# Patient Record
Sex: Female | Born: 1970 | Race: White | Hispanic: No | Marital: Married | Smoking: Never smoker
Health system: Southern US, Community
[De-identification: ages and names within clinical notes are randomized; demographics above are authoritative.]

## PROBLEM LIST (undated history)

## (undated) DIAGNOSIS — K829 Disease of gallbladder, unspecified: Secondary | ICD-10-CM

## (undated) DIAGNOSIS — K219 Gastro-esophageal reflux disease without esophagitis: Secondary | ICD-10-CM

## (undated) DIAGNOSIS — M791 Myalgia, unspecified site: Secondary | ICD-10-CM

## (undated) DIAGNOSIS — R7303 Prediabetes: Secondary | ICD-10-CM

## (undated) DIAGNOSIS — K760 Fatty (change of) liver, not elsewhere classified: Secondary | ICD-10-CM

## (undated) DIAGNOSIS — M064 Inflammatory polyarthropathy: Secondary | ICD-10-CM

## (undated) DIAGNOSIS — M797 Fibromyalgia: Secondary | ICD-10-CM

## (undated) DIAGNOSIS — R079 Chest pain, unspecified: Secondary | ICD-10-CM

## (undated) DIAGNOSIS — J45909 Unspecified asthma, uncomplicated: Secondary | ICD-10-CM

## (undated) DIAGNOSIS — I5189 Other ill-defined heart diseases: Secondary | ICD-10-CM

## (undated) DIAGNOSIS — N979 Female infertility, unspecified: Secondary | ICD-10-CM

## (undated) DIAGNOSIS — N62 Hypertrophy of breast: Secondary | ICD-10-CM

## (undated) DIAGNOSIS — Z8742 Personal history of other diseases of the female genital tract: Secondary | ICD-10-CM

## (undated) DIAGNOSIS — G8929 Other chronic pain: Secondary | ICD-10-CM

## (undated) DIAGNOSIS — Z8659 Personal history of other mental and behavioral disorders: Secondary | ICD-10-CM

## (undated) DIAGNOSIS — N289 Disorder of kidney and ureter, unspecified: Secondary | ICD-10-CM

## (undated) DIAGNOSIS — J069 Acute upper respiratory infection, unspecified: Secondary | ICD-10-CM

## (undated) DIAGNOSIS — F419 Anxiety disorder, unspecified: Secondary | ICD-10-CM

## (undated) DIAGNOSIS — K589 Irritable bowel syndrome without diarrhea: Secondary | ICD-10-CM

## (undated) DIAGNOSIS — IMO0002 Reserved for concepts with insufficient information to code with codable children: Secondary | ICD-10-CM

## (undated) DIAGNOSIS — I10 Essential (primary) hypertension: Secondary | ICD-10-CM

## (undated) DIAGNOSIS — M549 Dorsalgia, unspecified: Secondary | ICD-10-CM

## (undated) DIAGNOSIS — M1990 Unspecified osteoarthritis, unspecified site: Secondary | ICD-10-CM

## (undated) DIAGNOSIS — E739 Lactose intolerance, unspecified: Secondary | ICD-10-CM

## (undated) DIAGNOSIS — K5900 Constipation, unspecified: Secondary | ICD-10-CM

## (undated) DIAGNOSIS — M2550 Pain in unspecified joint: Secondary | ICD-10-CM

## (undated) DIAGNOSIS — Z91018 Allergy to other foods: Secondary | ICD-10-CM

## (undated) DIAGNOSIS — E282 Polycystic ovarian syndrome: Secondary | ICD-10-CM

## (undated) DIAGNOSIS — M51369 Other intervertebral disc degeneration, lumbar region without mention of lumbar back pain or lower extremity pain: Secondary | ICD-10-CM

## (undated) DIAGNOSIS — R61 Generalized hyperhidrosis: Secondary | ICD-10-CM

## (undated) DIAGNOSIS — E538 Deficiency of other specified B group vitamins: Secondary | ICD-10-CM

## (undated) DIAGNOSIS — F32A Depression, unspecified: Secondary | ICD-10-CM

## (undated) DIAGNOSIS — Z9889 Other specified postprocedural states: Secondary | ICD-10-CM

## (undated) DIAGNOSIS — D649 Anemia, unspecified: Secondary | ICD-10-CM

## (undated) DIAGNOSIS — E039 Hypothyroidism, unspecified: Secondary | ICD-10-CM

## (undated) DIAGNOSIS — F329 Major depressive disorder, single episode, unspecified: Secondary | ICD-10-CM

## (undated) DIAGNOSIS — R112 Nausea with vomiting, unspecified: Secondary | ICD-10-CM

## (undated) DIAGNOSIS — M5136 Other intervertebral disc degeneration, lumbar region: Secondary | ICD-10-CM

## (undated) DIAGNOSIS — Z87442 Personal history of urinary calculi: Secondary | ICD-10-CM

## (undated) DIAGNOSIS — M329 Systemic lupus erythematosus, unspecified: Secondary | ICD-10-CM

## (undated) HISTORY — DX: Anemia, unspecified: D64.9

## (undated) HISTORY — DX: Unspecified osteoarthritis, unspecified site: M19.90

## (undated) HISTORY — DX: Constipation, unspecified: K59.00

## (undated) HISTORY — PX: PLANTAR FASCIA SURGERY: SHX746

## (undated) HISTORY — DX: Generalized hyperhidrosis: R61

## (undated) HISTORY — PX: CARPAL TUNNEL RELEASE: SHX101

## (undated) HISTORY — DX: Gastro-esophageal reflux disease without esophagitis: K21.9

## (undated) HISTORY — DX: Disease of gallbladder, unspecified: K82.9

## (undated) HISTORY — DX: Other intervertebral disc degeneration, lumbar region: M51.36

## (undated) HISTORY — DX: Systemic lupus erythematosus, unspecified: M32.9

## (undated) HISTORY — DX: Fatty (change of) liver, not elsewhere classified: K76.0

## (undated) HISTORY — DX: Anxiety disorder, unspecified: F41.9

## (undated) HISTORY — PX: BACK SURGERY: SHX140

## (undated) HISTORY — DX: Polycystic ovarian syndrome: E28.2

## (undated) HISTORY — DX: Irritable bowel syndrome, unspecified: K58.9

## (undated) HISTORY — DX: Fibromyalgia: M79.7

## (undated) HISTORY — DX: Lactose intolerance, unspecified: E73.9

## (undated) HISTORY — DX: Major depressive disorder, single episode, unspecified: F32.9

## (undated) HISTORY — PX: ABDOMINAL HYSTERECTOMY: SHX81

## (undated) HISTORY — DX: Allergy to other foods: Z91.018

## (undated) HISTORY — DX: Unspecified asthma, uncomplicated: J45.909

## (undated) HISTORY — DX: Essential (primary) hypertension: I10

## (undated) HISTORY — DX: Other intervertebral disc degeneration, lumbar region without mention of lumbar back pain or lower extremity pain: M51.369

## (undated) HISTORY — DX: Reserved for concepts with insufficient information to code with codable children: IMO0002

## (undated) HISTORY — PX: TONSILLECTOMY: SUR1361

## (undated) HISTORY — DX: Female infertility, unspecified: N97.9

## (undated) HISTORY — PX: EYE SURGERY: SHX253

## (undated) HISTORY — DX: Depression, unspecified: F32.A

## (undated) HISTORY — DX: Prediabetes: R73.03

## (undated) HISTORY — DX: Deficiency of other specified B group vitamins: E53.8

## (undated) HISTORY — DX: Personal history of other mental and behavioral disorders: Z86.59

## (undated) HISTORY — PX: COLONOSCOPY: SHX174

## (undated) HISTORY — PX: DILATION AND CURETTAGE OF UTERUS: SHX78

## (undated) HISTORY — DX: Hypothyroidism, unspecified: E03.9

## (undated) HISTORY — DX: Pain in unspecified joint: M25.50

## (undated) HISTORY — DX: Dorsalgia, unspecified: M54.9

## (undated) HISTORY — DX: Disorder of kidney and ureter, unspecified: N28.9

## (undated) HISTORY — PX: REDUCTION MAMMAPLASTY: SUR839

## (undated) HISTORY — PX: ANKLE SURGERY: SHX546

---

## 1989-04-04 HISTORY — PX: APPENDECTOMY: SHX54

## 1997-08-21 ENCOUNTER — Inpatient Hospital Stay (HOSPITAL_COMMUNITY): Admission: AD | Admit: 1997-08-21 | Discharge: 1997-08-21 | Payer: Self-pay | Admitting: Obstetrics and Gynecology

## 1997-10-10 ENCOUNTER — Ambulatory Visit (HOSPITAL_COMMUNITY): Admission: RE | Admit: 1997-10-10 | Discharge: 1997-10-10 | Payer: Self-pay | Admitting: *Deleted

## 1998-04-04 HISTORY — PX: LAPAROSCOPIC ASSISTED VAGINAL HYSTERECTOMY: SHX5398

## 1998-06-19 ENCOUNTER — Ambulatory Visit (HOSPITAL_COMMUNITY): Admission: RE | Admit: 1998-06-19 | Discharge: 1998-06-19 | Payer: Self-pay | Admitting: Gastroenterology

## 1998-07-20 ENCOUNTER — Inpatient Hospital Stay (HOSPITAL_COMMUNITY): Admission: AD | Admit: 1998-07-20 | Discharge: 1998-07-20 | Payer: Self-pay | Admitting: Obstetrics and Gynecology

## 1998-09-21 ENCOUNTER — Inpatient Hospital Stay (HOSPITAL_COMMUNITY): Admission: RE | Admit: 1998-09-21 | Discharge: 1998-09-23 | Payer: Self-pay | Admitting: *Deleted

## 1998-09-21 ENCOUNTER — Encounter (INDEPENDENT_AMBULATORY_CARE_PROVIDER_SITE_OTHER): Payer: Self-pay | Admitting: Specialist

## 1999-04-02 ENCOUNTER — Ambulatory Visit (HOSPITAL_COMMUNITY): Admission: RE | Admit: 1999-04-02 | Discharge: 1999-04-02 | Payer: Self-pay | Admitting: *Deleted

## 1999-04-04 ENCOUNTER — Emergency Department (HOSPITAL_COMMUNITY): Admission: EM | Admit: 1999-04-04 | Discharge: 1999-04-04 | Payer: Self-pay | Admitting: Emergency Medicine

## 1999-06-25 ENCOUNTER — Ambulatory Visit (HOSPITAL_COMMUNITY): Admission: RE | Admit: 1999-06-25 | Discharge: 1999-06-25 | Payer: Self-pay | Admitting: Urology

## 1999-11-03 ENCOUNTER — Encounter (INDEPENDENT_AMBULATORY_CARE_PROVIDER_SITE_OTHER): Payer: Self-pay

## 1999-11-03 ENCOUNTER — Ambulatory Visit (HOSPITAL_COMMUNITY): Admission: RE | Admit: 1999-11-03 | Discharge: 1999-11-03 | Payer: Self-pay | Admitting: *Deleted

## 1999-11-03 HISTORY — PX: OTHER SURGICAL HISTORY: SHX169

## 1999-11-06 ENCOUNTER — Inpatient Hospital Stay (HOSPITAL_COMMUNITY): Admission: AD | Admit: 1999-11-06 | Discharge: 1999-11-06 | Payer: Self-pay | Admitting: *Deleted

## 2000-01-03 ENCOUNTER — Encounter: Payer: Self-pay | Admitting: Emergency Medicine

## 2000-01-03 ENCOUNTER — Emergency Department (HOSPITAL_COMMUNITY): Admission: EM | Admit: 2000-01-03 | Discharge: 2000-01-03 | Payer: Self-pay | Admitting: Emergency Medicine

## 2000-03-14 ENCOUNTER — Ambulatory Visit (HOSPITAL_COMMUNITY): Admission: RE | Admit: 2000-03-14 | Discharge: 2000-03-14 | Payer: Self-pay | Admitting: Gastroenterology

## 2000-03-14 ENCOUNTER — Encounter: Payer: Self-pay | Admitting: Gastroenterology

## 2000-07-17 ENCOUNTER — Ambulatory Visit (HOSPITAL_COMMUNITY): Admission: RE | Admit: 2000-07-17 | Discharge: 2000-07-17 | Payer: Self-pay | Admitting: Gastroenterology

## 2000-07-17 ENCOUNTER — Encounter: Payer: Self-pay | Admitting: Gastroenterology

## 2000-08-03 ENCOUNTER — Encounter: Payer: Self-pay | Admitting: Urology

## 2000-08-04 ENCOUNTER — Ambulatory Visit (HOSPITAL_COMMUNITY): Admission: RE | Admit: 2000-08-04 | Discharge: 2000-08-04 | Payer: Self-pay | Admitting: Urology

## 2000-08-04 HISTORY — PX: OTHER SURGICAL HISTORY: SHX169

## 2000-09-27 ENCOUNTER — Other Ambulatory Visit: Admission: RE | Admit: 2000-09-27 | Discharge: 2000-09-27 | Payer: Self-pay | Admitting: *Deleted

## 2000-11-30 ENCOUNTER — Ambulatory Visit (HOSPITAL_COMMUNITY): Admission: RE | Admit: 2000-11-30 | Discharge: 2000-11-30 | Payer: Self-pay | Admitting: Urology

## 2000-11-30 ENCOUNTER — Encounter: Payer: Self-pay | Admitting: Urology

## 2001-04-04 HISTORY — PX: LUMBAR LAMINECTOMY: SHX95

## 2001-05-17 ENCOUNTER — Encounter (INDEPENDENT_AMBULATORY_CARE_PROVIDER_SITE_OTHER): Payer: Self-pay | Admitting: Specialist

## 2001-05-17 ENCOUNTER — Ambulatory Visit (HOSPITAL_COMMUNITY): Admission: RE | Admit: 2001-05-17 | Discharge: 2001-05-17 | Payer: Self-pay | Admitting: Obstetrics and Gynecology

## 2001-05-17 HISTORY — PX: OTHER SURGICAL HISTORY: SHX169

## 2001-08-08 ENCOUNTER — Inpatient Hospital Stay (HOSPITAL_COMMUNITY): Admission: AD | Admit: 2001-08-08 | Discharge: 2001-08-08 | Payer: Self-pay | Admitting: Obstetrics and Gynecology

## 2001-11-08 ENCOUNTER — Encounter: Payer: Self-pay | Admitting: Neurosurgery

## 2001-11-12 ENCOUNTER — Encounter: Payer: Self-pay | Admitting: Neurosurgery

## 2001-11-12 ENCOUNTER — Inpatient Hospital Stay (HOSPITAL_COMMUNITY): Admission: RE | Admit: 2001-11-12 | Discharge: 2001-11-14 | Payer: Self-pay | Admitting: Neurosurgery

## 2002-02-05 ENCOUNTER — Encounter (INDEPENDENT_AMBULATORY_CARE_PROVIDER_SITE_OTHER): Payer: Self-pay

## 2002-02-05 ENCOUNTER — Ambulatory Visit (HOSPITAL_COMMUNITY): Admission: RE | Admit: 2002-02-05 | Discharge: 2002-02-05 | Payer: Self-pay | Admitting: *Deleted

## 2002-02-05 HISTORY — PX: OTHER SURGICAL HISTORY: SHX169

## 2002-02-08 ENCOUNTER — Inpatient Hospital Stay (HOSPITAL_COMMUNITY): Admission: AD | Admit: 2002-02-08 | Discharge: 2002-02-08 | Payer: Self-pay | Admitting: Obstetrics and Gynecology

## 2002-04-03 ENCOUNTER — Encounter: Payer: Self-pay | Admitting: Urology

## 2002-04-03 ENCOUNTER — Encounter: Admission: RE | Admit: 2002-04-03 | Discharge: 2002-04-03 | Payer: Self-pay | Admitting: Urology

## 2002-09-25 ENCOUNTER — Other Ambulatory Visit: Admission: RE | Admit: 2002-09-25 | Discharge: 2002-09-25 | Payer: Self-pay | Admitting: *Deleted

## 2002-12-02 ENCOUNTER — Encounter: Admission: RE | Admit: 2002-12-02 | Discharge: 2003-03-02 | Payer: Self-pay | Admitting: Family Medicine

## 2002-12-12 ENCOUNTER — Encounter: Payer: Self-pay | Admitting: Cardiology

## 2003-04-18 ENCOUNTER — Ambulatory Visit (HOSPITAL_COMMUNITY): Admission: RE | Admit: 2003-04-18 | Discharge: 2003-04-18 | Payer: Self-pay | Admitting: Neurosurgery

## 2003-09-30 ENCOUNTER — Other Ambulatory Visit: Admission: RE | Admit: 2003-09-30 | Discharge: 2003-09-30 | Payer: Self-pay | Admitting: Obstetrics and Gynecology

## 2003-11-13 ENCOUNTER — Encounter: Admission: RE | Admit: 2003-11-13 | Discharge: 2003-11-13 | Payer: Self-pay | Admitting: Internal Medicine

## 2003-12-30 ENCOUNTER — Emergency Department (HOSPITAL_COMMUNITY): Admission: EM | Admit: 2003-12-30 | Discharge: 2003-12-30 | Payer: Self-pay | Admitting: Emergency Medicine

## 2004-04-27 ENCOUNTER — Ambulatory Visit: Payer: Self-pay | Admitting: Internal Medicine

## 2004-06-09 ENCOUNTER — Encounter: Admission: RE | Admit: 2004-06-09 | Discharge: 2004-06-09 | Payer: Self-pay | Admitting: Gastroenterology

## 2004-06-11 ENCOUNTER — Encounter: Admission: RE | Admit: 2004-06-11 | Discharge: 2004-06-11 | Payer: Self-pay | Admitting: Gastroenterology

## 2004-06-15 ENCOUNTER — Ambulatory Visit: Payer: Self-pay | Admitting: Internal Medicine

## 2004-06-18 ENCOUNTER — Encounter: Admission: RE | Admit: 2004-06-18 | Discharge: 2004-06-18 | Payer: Self-pay | Admitting: Surgery

## 2004-06-20 ENCOUNTER — Inpatient Hospital Stay (HOSPITAL_COMMUNITY): Admission: EM | Admit: 2004-06-20 | Discharge: 2004-06-22 | Payer: Self-pay | Admitting: Emergency Medicine

## 2004-06-21 ENCOUNTER — Encounter (INDEPENDENT_AMBULATORY_CARE_PROVIDER_SITE_OTHER): Payer: Self-pay | Admitting: *Deleted

## 2004-06-22 ENCOUNTER — Encounter (INDEPENDENT_AMBULATORY_CARE_PROVIDER_SITE_OTHER): Payer: Self-pay | Admitting: *Deleted

## 2004-07-09 ENCOUNTER — Ambulatory Visit (HOSPITAL_BASED_OUTPATIENT_CLINIC_OR_DEPARTMENT_OTHER): Admission: RE | Admit: 2004-07-09 | Discharge: 2004-07-09 | Payer: Self-pay | Admitting: Urology

## 2004-07-09 ENCOUNTER — Ambulatory Visit (HOSPITAL_COMMUNITY): Admission: RE | Admit: 2004-07-09 | Discharge: 2004-07-09 | Payer: Self-pay | Admitting: Urology

## 2004-07-09 HISTORY — PX: OTHER SURGICAL HISTORY: SHX169

## 2004-09-01 ENCOUNTER — Ambulatory Visit: Payer: Self-pay | Admitting: Internal Medicine

## 2004-11-17 ENCOUNTER — Ambulatory Visit: Payer: Self-pay | Admitting: Internal Medicine

## 2004-12-16 ENCOUNTER — Other Ambulatory Visit: Admission: RE | Admit: 2004-12-16 | Discharge: 2004-12-16 | Payer: Self-pay | Admitting: Obstetrics and Gynecology

## 2004-12-27 ENCOUNTER — Ambulatory Visit (HOSPITAL_COMMUNITY): Admission: RE | Admit: 2004-12-27 | Discharge: 2004-12-27 | Payer: Self-pay | Admitting: Obstetrics and Gynecology

## 2005-01-07 ENCOUNTER — Ambulatory Visit: Payer: Self-pay | Admitting: Internal Medicine

## 2005-10-03 ENCOUNTER — Ambulatory Visit: Payer: Self-pay | Admitting: Internal Medicine

## 2005-12-28 ENCOUNTER — Ambulatory Visit: Payer: Self-pay | Admitting: Internal Medicine

## 2006-01-31 ENCOUNTER — Emergency Department (HOSPITAL_COMMUNITY): Admission: EM | Admit: 2006-01-31 | Discharge: 2006-02-01 | Payer: Self-pay | Admitting: Emergency Medicine

## 2006-02-01 ENCOUNTER — Ambulatory Visit: Payer: Self-pay | Admitting: Internal Medicine

## 2006-07-11 ENCOUNTER — Encounter: Payer: Self-pay | Admitting: Cardiovascular Disease

## 2006-10-02 ENCOUNTER — Ambulatory Visit (HOSPITAL_COMMUNITY): Admission: RE | Admit: 2006-10-02 | Discharge: 2006-10-02 | Payer: Self-pay | Admitting: Family Medicine

## 2006-10-11 ENCOUNTER — Encounter: Payer: Self-pay | Admitting: Cardiovascular Disease

## 2006-10-13 ENCOUNTER — Encounter: Payer: Self-pay | Admitting: Cardiovascular Disease

## 2006-10-20 ENCOUNTER — Encounter: Payer: Self-pay | Admitting: Cardiovascular Disease

## 2006-11-21 ENCOUNTER — Encounter: Payer: Self-pay | Admitting: Cardiovascular Disease

## 2006-11-23 ENCOUNTER — Ambulatory Visit (HOSPITAL_COMMUNITY): Admission: RE | Admit: 2006-11-23 | Discharge: 2006-11-23 | Payer: Self-pay | Admitting: Allergy

## 2006-11-28 ENCOUNTER — Encounter: Payer: Self-pay | Admitting: Cardiovascular Disease

## 2007-03-02 ENCOUNTER — Ambulatory Visit: Payer: Self-pay | Admitting: Internal Medicine

## 2007-03-02 DIAGNOSIS — I10 Essential (primary) hypertension: Secondary | ICD-10-CM | POA: Insufficient documentation

## 2007-03-02 DIAGNOSIS — E039 Hypothyroidism, unspecified: Secondary | ICD-10-CM | POA: Insufficient documentation

## 2007-03-02 DIAGNOSIS — E282 Polycystic ovarian syndrome: Secondary | ICD-10-CM

## 2007-03-02 HISTORY — DX: Polycystic ovarian syndrome: E28.2

## 2007-03-27 ENCOUNTER — Ambulatory Visit: Payer: Self-pay | Admitting: Internal Medicine

## 2007-03-27 ENCOUNTER — Telehealth: Payer: Self-pay | Admitting: Internal Medicine

## 2007-04-10 ENCOUNTER — Ambulatory Visit: Payer: Self-pay | Admitting: Internal Medicine

## 2007-04-10 ENCOUNTER — Telehealth (INDEPENDENT_AMBULATORY_CARE_PROVIDER_SITE_OTHER): Payer: Self-pay | Admitting: *Deleted

## 2007-04-10 DIAGNOSIS — F329 Major depressive disorder, single episode, unspecified: Secondary | ICD-10-CM | POA: Insufficient documentation

## 2007-04-10 DIAGNOSIS — F411 Generalized anxiety disorder: Secondary | ICD-10-CM | POA: Insufficient documentation

## 2007-04-10 DIAGNOSIS — R519 Headache, unspecified: Secondary | ICD-10-CM | POA: Insufficient documentation

## 2007-04-10 DIAGNOSIS — R079 Chest pain, unspecified: Secondary | ICD-10-CM | POA: Insufficient documentation

## 2007-04-10 DIAGNOSIS — R51 Headache: Secondary | ICD-10-CM | POA: Insufficient documentation

## 2007-04-10 LAB — CONVERTED CEMR LAB: Potassium: 4.2 meq/L (ref 3.5–5.1)

## 2007-04-11 ENCOUNTER — Telehealth: Payer: Self-pay | Admitting: Internal Medicine

## 2007-04-12 ENCOUNTER — Ambulatory Visit: Payer: Self-pay | Admitting: Internal Medicine

## 2007-04-12 DIAGNOSIS — K5289 Other specified noninfective gastroenteritis and colitis: Secondary | ICD-10-CM | POA: Insufficient documentation

## 2007-04-12 DIAGNOSIS — R82998 Other abnormal findings in urine: Secondary | ICD-10-CM | POA: Insufficient documentation

## 2007-04-13 ENCOUNTER — Encounter: Payer: Self-pay | Admitting: Internal Medicine

## 2007-04-17 ENCOUNTER — Encounter (INDEPENDENT_AMBULATORY_CARE_PROVIDER_SITE_OTHER): Payer: Self-pay | Admitting: *Deleted

## 2007-04-17 ENCOUNTER — Ambulatory Visit: Payer: Self-pay | Admitting: Internal Medicine

## 2007-04-17 DIAGNOSIS — R1011 Right upper quadrant pain: Secondary | ICD-10-CM | POA: Insufficient documentation

## 2007-04-17 DIAGNOSIS — R197 Diarrhea, unspecified: Secondary | ICD-10-CM | POA: Insufficient documentation

## 2007-04-19 LAB — CONVERTED CEMR LAB
ALT: 25 units/L (ref 0–35)
Amylase: 39 units/L (ref 27–131)
Eosinophils Absolute: 0.1 10*3/uL (ref 0.0–0.6)
Lymphocytes Relative: 35.5 % (ref 12.0–46.0)
MCV: 85.3 fL (ref 78.0–100.0)
Monocytes Relative: 9.6 % (ref 3.0–11.0)
Neutro Abs: 2.8 10*3/uL (ref 1.4–7.7)
Platelets: 247 10*3/uL (ref 150–400)

## 2007-04-20 ENCOUNTER — Encounter: Admission: RE | Admit: 2007-04-20 | Discharge: 2007-04-20 | Payer: Self-pay | Admitting: Internal Medicine

## 2007-04-23 ENCOUNTER — Encounter (INDEPENDENT_AMBULATORY_CARE_PROVIDER_SITE_OTHER): Payer: Self-pay | Admitting: *Deleted

## 2007-04-24 ENCOUNTER — Encounter (INDEPENDENT_AMBULATORY_CARE_PROVIDER_SITE_OTHER): Payer: Self-pay | Admitting: *Deleted

## 2007-04-24 ENCOUNTER — Telehealth (INDEPENDENT_AMBULATORY_CARE_PROVIDER_SITE_OTHER): Payer: Self-pay | Admitting: *Deleted

## 2007-04-24 LAB — CONVERTED CEMR LAB
Metanephrines, Ur: 53
Normetanephrine, 24H Ur: 70

## 2007-05-02 ENCOUNTER — Ambulatory Visit: Payer: Self-pay | Admitting: Gastroenterology

## 2007-05-04 ENCOUNTER — Ambulatory Visit (HOSPITAL_COMMUNITY): Admission: RE | Admit: 2007-05-04 | Discharge: 2007-05-04 | Payer: Self-pay | Admitting: Obstetrics & Gynecology

## 2007-05-04 ENCOUNTER — Ambulatory Visit: Payer: Self-pay | Admitting: Gastroenterology

## 2007-05-04 LAB — CONVERTED CEMR LAB
ALT: 23 units/L (ref 0–35)
AST: 19 units/L (ref 0–37)
Basophils Absolute: 0.1 10*3/uL (ref 0.0–0.1)
Bilirubin, Direct: 0.2 mg/dL (ref 0.0–0.3)
Eosinophils Relative: 1.2 % (ref 0.0–5.0)
HCT: 37 % (ref 36.0–46.0)
Neutrophils Relative %: 62.5 % (ref 43.0–77.0)
RBC: 4.4 M/uL (ref 3.87–5.11)
RDW: 12.8 % (ref 11.5–14.6)
Total Protein: 6.9 g/dL (ref 6.0–8.3)
WBC: 9.2 10*3/uL (ref 4.5–10.5)

## 2007-05-07 ENCOUNTER — Ambulatory Visit (HOSPITAL_COMMUNITY): Admission: RE | Admit: 2007-05-07 | Discharge: 2007-05-07 | Payer: Self-pay | Admitting: Gastroenterology

## 2007-05-07 ENCOUNTER — Encounter: Payer: Self-pay | Admitting: Internal Medicine

## 2007-05-07 ENCOUNTER — Ambulatory Visit: Payer: Self-pay | Admitting: Gastroenterology

## 2007-05-07 LAB — CONVERTED CEMR LAB
Crystals: NEGATIVE
Ketones, ur: NEGATIVE mg/dL
Leukocytes, UA: NEGATIVE
Mucus, UA: NEGATIVE
Total Protein, Urine: NEGATIVE mg/dL

## 2007-05-14 ENCOUNTER — Ambulatory Visit: Payer: Self-pay | Admitting: Gastroenterology

## 2007-05-25 ENCOUNTER — Ambulatory Visit: Payer: Self-pay | Admitting: Internal Medicine

## 2007-05-25 DIAGNOSIS — J019 Acute sinusitis, unspecified: Secondary | ICD-10-CM | POA: Insufficient documentation

## 2007-05-31 ENCOUNTER — Ambulatory Visit (HOSPITAL_COMMUNITY): Admission: RE | Admit: 2007-05-31 | Discharge: 2007-06-01 | Payer: Self-pay | Admitting: General Surgery

## 2007-05-31 ENCOUNTER — Encounter (INDEPENDENT_AMBULATORY_CARE_PROVIDER_SITE_OTHER): Payer: Self-pay | Admitting: General Surgery

## 2007-05-31 HISTORY — PX: LAPAROSCOPIC CHOLECYSTECTOMY: SUR755

## 2007-07-03 ENCOUNTER — Telehealth (INDEPENDENT_AMBULATORY_CARE_PROVIDER_SITE_OTHER): Payer: Self-pay | Admitting: *Deleted

## 2007-09-13 ENCOUNTER — Telehealth (INDEPENDENT_AMBULATORY_CARE_PROVIDER_SITE_OTHER): Payer: Self-pay | Admitting: *Deleted

## 2007-09-28 ENCOUNTER — Encounter: Payer: Self-pay | Admitting: Internal Medicine

## 2007-10-10 ENCOUNTER — Telehealth (INDEPENDENT_AMBULATORY_CARE_PROVIDER_SITE_OTHER): Payer: Self-pay | Admitting: *Deleted

## 2007-10-10 ENCOUNTER — Encounter: Payer: Self-pay | Admitting: Family Medicine

## 2008-01-02 ENCOUNTER — Encounter: Payer: Self-pay | Admitting: Cardiovascular Disease

## 2008-01-24 ENCOUNTER — Encounter: Payer: Self-pay | Admitting: Cardiovascular Disease

## 2008-03-11 ENCOUNTER — Encounter: Payer: Self-pay | Admitting: Internal Medicine

## 2008-05-12 ENCOUNTER — Encounter: Payer: Self-pay | Admitting: Internal Medicine

## 2008-06-04 ENCOUNTER — Ambulatory Visit: Payer: Self-pay | Admitting: Internal Medicine

## 2008-07-18 ENCOUNTER — Encounter: Payer: Self-pay | Admitting: Internal Medicine

## 2008-07-21 ENCOUNTER — Ambulatory Visit (HOSPITAL_COMMUNITY): Admission: RE | Admit: 2008-07-21 | Discharge: 2008-07-21 | Payer: Self-pay | Admitting: Rheumatology

## 2008-08-27 ENCOUNTER — Ambulatory Visit (HOSPITAL_COMMUNITY): Admission: RE | Admit: 2008-08-27 | Discharge: 2008-08-27 | Payer: Self-pay | Admitting: Orthopedic Surgery

## 2008-10-01 ENCOUNTER — Encounter: Payer: Self-pay | Admitting: Internal Medicine

## 2008-11-04 ENCOUNTER — Encounter: Payer: Self-pay | Admitting: Internal Medicine

## 2008-11-26 ENCOUNTER — Encounter: Payer: Self-pay | Admitting: Cardiovascular Disease

## 2008-12-12 ENCOUNTER — Encounter: Payer: Self-pay | Admitting: Cardiovascular Disease

## 2009-01-06 ENCOUNTER — Ambulatory Visit: Payer: Self-pay | Admitting: Cardiovascular Disease

## 2009-01-06 DIAGNOSIS — I10 Essential (primary) hypertension: Secondary | ICD-10-CM | POA: Insufficient documentation

## 2009-01-07 ENCOUNTER — Encounter: Payer: Self-pay | Admitting: Internal Medicine

## 2009-01-13 ENCOUNTER — Telehealth (INDEPENDENT_AMBULATORY_CARE_PROVIDER_SITE_OTHER): Payer: Self-pay | Admitting: *Deleted

## 2009-01-27 ENCOUNTER — Telehealth (INDEPENDENT_AMBULATORY_CARE_PROVIDER_SITE_OTHER): Payer: Self-pay | Admitting: *Deleted

## 2009-03-24 ENCOUNTER — Ambulatory Visit: Payer: Self-pay

## 2009-04-13 ENCOUNTER — Telehealth: Payer: Self-pay | Admitting: Cardiology

## 2009-05-07 ENCOUNTER — Ambulatory Visit: Payer: Self-pay | Admitting: Cardiovascular Disease

## 2009-05-07 ENCOUNTER — Telehealth: Payer: Self-pay | Admitting: Cardiovascular Disease

## 2009-05-19 ENCOUNTER — Encounter: Payer: Self-pay | Admitting: Internal Medicine

## 2009-06-11 ENCOUNTER — Encounter: Payer: Self-pay | Admitting: Internal Medicine

## 2009-06-19 LAB — CONVERTED CEMR LAB
ALT: 18 units/L (ref 0–35)
Albumin: 4 g/dL (ref 3.5–5.2)
HDL: 57 mg/dL (ref >39)
LDL Cholesterol: 67 mg/dL (ref 0–99)
Total CHOL/HDL Ratio: 2.5
Total Protein: 6.7 g/dL (ref 6.0–8.3)
Triglycerides: 87 mg/dL (ref <150)
VLDL: 17 mg/dL (ref 0–40)

## 2009-06-30 ENCOUNTER — Ambulatory Visit: Payer: Self-pay | Admitting: Endocrinology

## 2009-06-30 DIAGNOSIS — G5600 Carpal tunnel syndrome, unspecified upper limb: Secondary | ICD-10-CM | POA: Insufficient documentation

## 2009-06-30 DIAGNOSIS — E538 Deficiency of other specified B group vitamins: Secondary | ICD-10-CM | POA: Insufficient documentation

## 2009-06-30 LAB — CONVERTED CEMR LAB
Calcium: 9.5 mg/dL (ref 8.4–10.5)
GFR calc non Af Amer: 84.6 mL/min (ref >60)
Potassium: 4.2 meq/L (ref 3.5–5.1)
Sodium: 143 meq/L (ref 135–145)
TSH: 1.37 microintl units/mL (ref 0.35–5.50)
Vitamin B-12: 576 pg/mL (ref 211–911)

## 2009-07-22 ENCOUNTER — Encounter: Payer: Self-pay | Admitting: Endocrinology

## 2009-09-02 ENCOUNTER — Encounter: Payer: Self-pay | Admitting: Endocrinology

## 2009-09-10 ENCOUNTER — Encounter: Payer: Self-pay | Admitting: Endocrinology

## 2009-10-28 ENCOUNTER — Telehealth (INDEPENDENT_AMBULATORY_CARE_PROVIDER_SITE_OTHER): Payer: Self-pay | Admitting: *Deleted

## 2009-11-23 ENCOUNTER — Encounter: Payer: Self-pay | Admitting: Internal Medicine

## 2009-11-23 ENCOUNTER — Encounter: Admission: RE | Admit: 2009-11-23 | Discharge: 2009-11-23 | Payer: Self-pay | Admitting: Obstetrics and Gynecology

## 2009-12-25 ENCOUNTER — Encounter: Payer: Self-pay | Admitting: Internal Medicine

## 2009-12-31 ENCOUNTER — Encounter: Admission: RE | Admit: 2009-12-31 | Discharge: 2009-12-31 | Payer: Self-pay | Admitting: Rheumatology

## 2010-01-02 ENCOUNTER — Ambulatory Visit: Payer: Self-pay | Admitting: Family Medicine

## 2010-01-13 ENCOUNTER — Encounter: Payer: Self-pay | Admitting: Internal Medicine

## 2010-04-20 ENCOUNTER — Telehealth: Payer: Self-pay | Admitting: Endocrinology

## 2010-04-25 ENCOUNTER — Encounter: Payer: Self-pay | Admitting: Surgery

## 2010-04-25 ENCOUNTER — Encounter: Payer: Self-pay | Admitting: Gastroenterology

## 2010-04-26 ENCOUNTER — Encounter: Payer: Self-pay | Admitting: Orthopedic Surgery

## 2010-05-04 NOTE — Letter (Signed)
Summary: Verne Spurr MD  Verne Spurr MD   Imported By: Sherian Rein 07/31/2009 09:06:02  _____________________________________________________________________  External Attachment:    Type:   Image     Comment:   External Document

## 2010-05-04 NOTE — Assessment & Plan Note (Signed)
Summary: NEW ENDO HYPOTHY [facility] PT-PKG/OFF-DR [facility] PH[facility]-ST[facility]   Vital Signs:  Patient profile:   40 year old female Height:      67 inches (170.18 cm) Weight:      247 pounds (112.27 kg) BMI:     38.83 O2 Sat:      97 % on Room air Temp:     98.3 degrees F (36.83 degrees C) oral Pulse rate:   76 / minute BP sitting:   100 / 62  (left arm) Cuff size:   large  Vitals Entered By: Josph Macho RMA (June 30, 2009 8:23 AM) Debby Freiberg Christopher,SMA  O2 Flow:  Room air CC: New Endo: Hypothyroid/ PT no longer gets B 12 injections,prednisone, plaquenil, cipro, zinc hydroxyzine or mexatheraxatic//[place]   CC:  New Endo: Hypothyroid/ PT no longer gets B 12 injections, prednisone, plaquenil, cipro, and zinc hydroxyzine or mexatheraxatic//[place].  History of Present Illness: pt states she was dx'ed with hypothyroidism approx 2 years ago.  she has been on synthroid since then. symptomatically, pt states 3 years of moderate pain at the hands, and associated numbness.  she had incomplete relief with surgery.    Current Medications (verified): 1)  Lisinopril 20 Mg Tabs (Lisinopril) .... 2 Tablets By Mouth Daily 2)  Levothroid 112 Mcg Tabs (Levothyroxine Sodium) .[name].. 1 By Mouth Once Daily 3)  Aspirin 81 Mg Tbec (Aspirin) .... Take One Tablet By Mouth Daily 4)  Fish Oil 1000 Mg Caps (Omega-3 Fatty Acids) .... 2 By Mouth Once Daily 5)  B12 Injections .... Q Month 6)  Glucosamine-Msm Complex-Collgn  Caps (Glucos-Msm-C-Mn-Gngr-Will-Coll) .[name].. 1 By Mouth Once Daily 7)  One-A-Day Womens  Tabs (Multiple Vitamins-Calcium) .[name].. 1 By Mouth Once Daily 8)  Vitamin D3 1000 Unit Caps (Cholecalciferol) .... 2 By Mouth Once Daily 9)  Hydrochlorothiazide 25 Mg Tabs (Hydrochlorothiazide) .... Take One Tablet By Mouth Daily. 10)  Plaquenil 200 Mg Tabs (Hydroxychloroquine Sulfate) .[name].. 1 By Mouth Four Times A Day 11)  Prednisone 5 Mg Tabs (Prednisone) .[name].. 1 By Mouth Once Daily As Directed 12)  Bupropion Hcl 100  Mg Xr12h-Tab (Bupropion Hcl) .[name].. 1 By Mouth Once Daily 13)  Mexathrexatic Injection .... .4 Ml Week 14)  Voltaren 1 % Gel (Diclofenac Sodium) .... As Needed 15)  Folic Acid 1 Mg Tabs (Folic Acid) .[name].. 1 By Mouth Once Daily 16)  Phenergan 25 Mg/ml Soln (Promethazine Hcl) .[name].. 1 By Mouth Once Daily As Needed 17)  Hydroxyzine Hcl 25 Mg/ml Soln (Hydroxyzine Hcl) .[name].. 1 By Mouth Two Times A Day 18)  Cipro 250 Mg Tabs (Ciprofloxacin Hcl) .[name].. 1 By Mouth Once Daily 19)  Zinc 25 Mg Tabs (Zinc) .[name].. 1 By Mouth Once Daily  Allergies (verified): 1)  ! Tylox 2)  ! * Ivp Dye 3)  ! Septra 4)  ! Nubain  Past History:  Past Medical History: Last updated: 01/06/2009 Hypothyroidism Hypertension urinalysis abnormal Depression Aanxiety Polycystic ovarian disease Arthritis-OA and RA  Family History: Reviewed history from 01/06/2009 and no changes required. Father: CAD with MI at age 15,lipids Mother: HTN Siblings: sister HTN, kidney disease due to calculi Grandfather-CVA at 42 Grandmother-Alzheimers Paternal Grandmother-MI   Social History: Reviewed history from 01/06/2009 and no changes required. Single, no children Never Smoked Social alcohol No illicit drugs Disability  Review of Systems       The patient complains of weight gain.  The patient denies vision loss and decreased hearing.         denies sob,  memory loss, constipation, blurry vision, polyuria, seizure, and syncope.  can't eval menses due to tah/bso in 2000.  she reports muscle cramps, dry skin, slight hair loss, depression.  no change in chronic arthralgias.   Physical Exam  Neck:  Supple without thyroid enlargement or tenderness.  Additional Exam:  FastTSH                   1.37 uIU/mL                 0.35-5.50    Sodium                    143 mEq/L                   135-145   Potassium                 4.2 mEq/L                   3.5-5.1   Chloride                  104 mEq/L                   96-112   Carbon  Dioxide            30 mEq/L                    19-32   Glucose                   85 mg/dL                    16-10   BUN                       14 mg/dL                    9-60   Creatinine                0.8 mg/dL                   4.5-4.0   Calcium                   9.5 mg/dL                   9.8-11.9      Vitamin B12               576 pg/mL      Impression & Recommendations:  Problem # 1:  HYPOTHYROIDISM (ICD-244.9) well-replaced  Problem # 2:  CARPAL TUNNEL SYNDROME, BILATERAL (ICD-354.0)  Problem # 3:  weight gain not thyroid-related  Problem # 4:  HYPERTENSION, BENIGN (ICD-401.1) overcontrolled  Medications Added to Medication List This Visit: 1)  Tramadol Hcl 50 Mg Tabs (Tramadol hcl) .[name].. 1 three times a day 2)  Hydrochlorothiazide 12.5 Mg Tabs (Hydrochlorothiazide) .[name].. 1 once daily  Other Orders: TLB-TSH (Thyroid Stimulating Hormone) (84443-TSH) TLB-BMP (Basic Metabolic Panel-BMET) (80048-METABOL) TLB-B12 + Folate Pnl (14782_95621-H08/MVH) Consultation Level IV (84696)  Patient Instructions: 1)  cc dr Kellie Simmering 2)  tests are being ordered for you today.  a few days after the test(s), please call [phone] to hear your test results. 3)  pending the test results, please continue the same medications for now, except reduce the hctz to 12.5 mg once  daily. 4)  (update: i left message on phone-tree:  rx as we discussed) Prescriptions: LEVOTHROID 112 MCG TABS (LEVOTHYROXINE SODIUM) 1 by mouth once daily  #90 x 3   Entered and Authorized by:   Minus Breeding MD   Signed by:   Minus Breeding MD on 06/30/2009   Method used:   Electronically to        [facility] [address] (retail)       [address], [address]       [place], [place]  [address]       Ph: [phone]       Fax: [phone]   RxID:   [id number] HYDROCHLOROTHIAZIDE 12.5 MG TABS (HYDROCHLOROTHIAZIDE) 1 once daily  #90 x 3   Entered and Authorized by:   Minus Breeding MD    Signed by:   Minus Breeding MD on 06/30/2009   Method used:   Electronically to        [facility] [address] (retail)       [address], [address]       [place], [place]  [address]       Ph: [phone]       Fax: [phone]   RxID:   [id number]

## 2010-05-04 NOTE — Letter (Signed)
Summary: [name] [name]   Imported By: Lanelle Bal 01/23/2010 10:52:10  _____________________________________________________________________  External Attachment:    Type:   Image     Comment:   External Document

## 2010-05-04 NOTE — Assessment & Plan Note (Signed)
Summary: ekg  Nurse Visit   Vital Signs:  Patient profile:   40 year old female Pulse rate:   80 / minute Pulse rhythm:   regular BP sitting:   148 / 90 Cuff size:   large  Allergies: 1)  ! Tylox 2)  ! * Ivp Dye 3)  ! Septra 4)  ! Nubain  Orders Added: 1)  T-Lipid Profile [80061-22930] 2)  T-Hepatic Function [80076-22960] 3)  EKG w/ Interpretation [93000]

## 2010-05-04 NOTE — Letter (Signed)
Summary: Stacey Drain MD  Stacey Drain MD   Imported By: Sherian Rein 09/22/2009 11:12:42  _____________________________________________________________________  External Attachment:    Type:   Image     Comment:   External Document

## 2010-05-04 NOTE — Letter (Signed)
SummaryDeboraha Sprang [name]   Imported By: Lanelle Bal 01/04/2010 12:03:52  _____________________________________________________________________  External Attachment:    Type:   Image     Comment:   External Document

## 2010-05-04 NOTE — Progress Notes (Signed)
Summary: RX  Medications Added LISINOPRIL 20 MG TABS (LISINOPRIL) 2 tablets by mouth daily       Phone Note Refill Request Call back at Home Phone [phone] Message from:  Patient on April 13, 2009 10:08 AM  Refills Requested: Medication #1:  LISINOPRIL 40 MG  TABS 1 by mouth qd PT WOULD LIKE FOR THIS TO BE CHANGED TO 2 20 MG TABLETS PER DAY AT [facility] BECAUSE IT WILL BE CHEAPER  Initial call taken by: Harlon Flor,  April 13, 2009 10:09 AM    New/Updated Medications: LISINOPRIL 20 MG TABS (LISINOPRIL) 2 tablets by mouth daily Prescriptions: LISINOPRIL 20 MG TABS (LISINOPRIL) 2 tablets by mouth daily  #60 x 6   Entered by:   Mercer Pod   Authorized by:   Marca Ancona, MD   Signed by:   Mercer Pod on 04/13/2009   Method used:   Electronically to        [facility] [address] (retail)       [address], [address]       [place], [place]  [address]       Ph: [phone]       Fax: [phone]   RxID:   [id number]

## 2010-05-04 NOTE — Assessment & Plan Note (Signed)
Summary: FLU SHOT/EVM   Vital Signs:  Patient Profile:   40 Years Old Female CC:      states no problems is being checked for lupus but no diagnosis  Height:     67 inches (170.18 cm) Temp:     98.9 degrees F oral                  Current Allergies: ! TYLOX ! * IVP DYE ! SEPTRA ! NUBAINHistory of Present Illness Chief Complaint: states no problems is being checked for lupus but no diagnosis     The patient and/or caregiver has been counseled thoroughly with regard to medications prescribed including dosage, schedule, interactions, rationale for use, and possible side effects and they verbalize understanding.  Diagnoses and expected course of recovery discussed and will return if not improved as expected or if the condition worsens. Patient and/or caregiver verbalized understanding.   Orders Added: 1)  Influenza A (H1N1) adm  fee Medicare/Non Medicare [Z6109]   Influenza Vaccine    Vaccine Type: flulaval    Site: left deltoid    Mfr: GlaxoSmithKline    Dose: 0.5 ml    Route: IM    Given by: Providence Crosby LPN    Exp. Date: 08/2010    Lot #: [id number]    VIS given: 10/27/09 version given January 02, 2010.  Flu Vaccine Consent Questions    Do you have a history of severe allergic reactions to this vaccine? no    Any prior history of allergic reactions to egg and/or gelatin? no    Do you have a sensitivity to the preservative Thimersol? no    Do you have a past history of Guillan-Barre Syndrome? no    Do you currently have an acute febrile illness? no    Have you ever had a severe reaction to latex? no    Vaccine information given and explained to patient? yes    Are you currently pregnant? no

## 2010-05-04 NOTE — Letter (Signed)
Summary: [facility]  [facility]   Imported By: Lanelle Bal 05/27/2009 12:10:36  _____________________________________________________________________  External Attachment:    Type:   Image     Comment:   External Document

## 2010-05-04 NOTE — Letter (Signed)
Summary: Stacey Drain MD  Stacey Drain MD   Imported By: Sherian Rein 09/09/2009 12:10:03  _____________________________________________________________________  External Attachment:    Type:   Image     Comment:   External Document

## 2010-05-04 NOTE — Progress Notes (Signed)
   Request recieved from [facility] [employer] sent to [facility]  October 28, 2009 11:01 AM

## 2010-05-04 NOTE — Letter (Signed)
SummaryDeboraha [name] [name]   Imported By: Lanelle Bal 11/30/2009 13:20:45  _____________________________________________________________________  External Attachment:    Type:   Image     Comment:   External Document

## 2010-05-04 NOTE — Progress Notes (Signed)
Summary: CHEST PAIN   Phone Note Call from Patient Call back at Home Phone [phone]   Caller: SELF Call For: [facility] Summary of Call: CHEST STARTED HURTING ABOUT AN HOUR AFTER EATING LAST NIGHT-SUBSIDED A LITTLE-LASTED ABOUT 2 HOURS-TODAY IT FEELS SORE IN HER CHEST AND SHE IS STILL HAVING A BURNING SENSATION AND SHE IS BURPING ALOT Initial call taken by: Mercer Pod,  May 07, 2009 2:20 PM  Follow-up for Phone Call        pt states that she feels like she was just experiencing acid reflux.  she is, however, concerned based on early heart disease in her family.  offered to have pt come in for ekg to assess any possible changes.  pt will come in this afternoon. Follow-up by: Charlena Cross, RN, BSN,  May 07, 2009 2:40 PM

## 2010-05-06 NOTE — Progress Notes (Signed)
Summary: Rx refill req  Phone Note Refill Request Message from:  Patient on April 20, 2010 10:09 AM  Refills Requested: Medication #1:  LEVOTHROID 112 MCG TABS 1 by mouth once daily   Dosage confirmed as above?Dosage Confirmed   Supply Requested: 6 months  Method Requested: Electronic Initial call taken by: Margaret Pyle, CMA,  April 20, 2010 10:09 AM    Prescriptions: LEVOTHROID 112 MCG TABS (LEVOTHYROXINE SODIUM) 1 by mouth once daily  #90 x 1   Entered by:   Margaret Pyle, CMA   Authorized by:   Minus Breeding MD   Signed by:   Margaret Pyle, CMA on 04/20/2010   Method used:   Electronically to        [facility] [address] (retail)       [address], [address]       [place], [place]  [address]       Ph: [phone]       Fax: [phone]   RxID:   [id number]

## 2010-05-10 ENCOUNTER — Telehealth (INDEPENDENT_AMBULATORY_CARE_PROVIDER_SITE_OTHER): Payer: Self-pay | Admitting: *Deleted

## 2010-05-14 ENCOUNTER — Other Ambulatory Visit: Payer: Self-pay | Admitting: Neurosurgery

## 2010-05-14 DIAGNOSIS — M549 Dorsalgia, unspecified: Secondary | ICD-10-CM

## 2010-05-17 ENCOUNTER — Ambulatory Visit
Admission: RE | Admit: 2010-05-17 | Discharge: 2010-05-17 | Disposition: A | Discharge status: Home / Self Care | Payer: Self-pay | Source: Ambulatory Visit | Attending: Neurosurgery | Admitting: Neurosurgery

## 2010-05-17 DIAGNOSIS — M549 Dorsalgia, unspecified: Secondary | ICD-10-CM

## 2010-05-18 ENCOUNTER — Telehealth: Payer: Self-pay | Admitting: Neurosurgery

## 2010-05-20 NOTE — Progress Notes (Signed)
   DDS Request Received sent to [facility]  May 10, 2010 8:58 AM

## 2010-07-12 ENCOUNTER — Other Ambulatory Visit: Payer: Self-pay | Admitting: Neurosurgery

## 2010-07-12 DIAGNOSIS — M549 Dorsalgia, unspecified: Secondary | ICD-10-CM

## 2010-07-16 ENCOUNTER — Ambulatory Visit
Admission: RE | Admit: 2010-07-16 | Discharge: 2010-07-16 | Disposition: A | Discharge status: Home / Self Care | Payer: Self-pay | Source: Ambulatory Visit | Attending: Neurosurgery | Admitting: Neurosurgery

## 2010-07-16 ENCOUNTER — Other Ambulatory Visit: Payer: Self-pay | Admitting: Neurosurgery

## 2010-07-16 DIAGNOSIS — M549 Dorsalgia, unspecified: Secondary | ICD-10-CM

## 2010-07-19 ENCOUNTER — Telehealth: Payer: Self-pay | Admitting: Cardiovascular Disease

## 2010-07-19 NOTE — Telephone Encounter (Signed)
Pt claling re bp running high

## 2010-07-19 NOTE — Telephone Encounter (Signed)
LVMTCB  [name] has not seen Dr. Clifton James since 2010. She will need a f/u with him.

## 2010-07-26 NOTE — Telephone Encounter (Signed)
Patient's BP and HR were elevated post spinal injection. These have both improved.  She is also complaining of palpitations at night with no SOB. She is trying to eliminate caffeine and finds herself getting anxious during these episodes.  I recommended relaxing with a warm bath at night and not to drink caffeine too closely to night. She agrees. She is switching insurances currently and will call us to set up a f/u appointment in the next few weeks.

## 2010-08-09 ENCOUNTER — Other Ambulatory Visit: Payer: Self-pay | Admitting: *Deleted

## 2010-08-09 MED ORDER — LISINOPRIL 20 MG PO TABS: 40.0000 mg | ORAL_TABLET | Freq: Every day | ORAL | Status: DC

## 2010-08-17 NOTE — Assessment & Plan Note (Signed)
[facility]                         GASTROENTEROLOGY OFFICE NOTE   FRANCISCO, EYERLY                     MRN:          [id number]  DATE:05/02/2007                            DOB:          1970/06/12    REFERRING PHYSICIAN:  Titus Dubin. Hopper, MD,FACP,FCCP   REASON FOR CONSULTATION:  Abdominal pain, nausea and vomiting.   [name] is a pleasant, 40 year old white female, referred through the  courtesy of Dr. Alwyn Ren for evaluation.  Over the past month, she has  been complaining of persistent nausea, right upper quadrant pain and  diarrhea.  Pain will sometimes radiate to the back.  It can last a  minute to up to 30 minutes.  She can also have stabbing right back pain.  She will have three to four bowel movements a day.  She will  occasionally awaken with urge to move her bowels.  She has had low-grade  fevers to 100.  She has lost 20 pounds over the past month.  Abdominal  ultrasound demonstrated fatty liver only.  There are no gallbladder  stones seen and bile ducts were normal.  Lab was pertinent for normal  liver tests, lipase and amylase and normal white count.  She denies  pyrosis.  She is on no gastric irritants, including nonsteroidals.  Nausea is fairly constant.   PAST MEDICAL HISTORY:  Pertinent for hypertension, thyroid disease.  She  has a remote history of ulcers.  She is status post hysterectomy and  appendectomy.   FAMILY HISTORY:  Noncontributory.   MEDICATIONS:  Include lisinopril, Cytomel, estradiol, Levoxyl,  metformin, baby aspirin, loratadine and labetalol and Crestor.   ALLERGIES:  She is allergic to TYLOX, SEPTRA, IVP DYE and NUBAIN.   She neither smokes nor drinks.  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   REVIEW OF SYSTEMS:  Negative.   EXAMINATION:  Pulse 82, blood pressure 124/70, weight 221.  HEENT: EOMI.  PERRLA.  Sclerae are anicteric.  Conjunctivae are pink.  NECK:  Supple without  thyromegaly, adenopathy or carotid bruits.  CHEST:  Clear to auscultation and percussion without adventitious  sounds.  CARDIAC:  Regular rhythm; normal S1 S2.  There are no murmurs, gallops  or rubs.  ABDOMEN:  She has mild right upper quadrant tenderness without guarding  or rebound.  There are no abdominal masses or organomegaly.  EXTREMITIES:  Full range of motion.  No cyanosis, clubbing or edema.  RECTAL:  Deferred.   IMPRESSION:  Persistent right upper quadrant pain, associated with  nausea and diarrhea.  Despite the negative ultrasound for gallbladder  disease, I am suspicious that this is a biliary tract problem, due to  perhaps acalculous cholecystitis.  Active peptic disease is less likely.   RECOMMENDATION:  1. Hepatobiliary scan.  2. To consider upper endoscopy, if the HIDA scan is completely normal.     Barbette Hair. Arlyce Dice, MD,FACG  Electronically Signed    RDK/MedQ  DD: 05/02/2007  DT: 05/02/2007  Job #: [id number]   cc:   Titus Dubin. Alwyn Ren, MD,FACP,FCCP

## 2010-08-17 NOTE — Letter (Signed)
May 02, 2007    Titus Dubin. Alwyn Ren, MD,FACP,FCCP  [phone] [place], [place] [address]   RE:  [name], [name]  MRN:  [id number]  /  DOB:  09/22/1970   Dear Dr. Alwyn Ren:   Upon your kind referral, I had the pleasure of evaluating your patient  and I am pleased to offer my findings.  I saw [name] in the  office today.  Enclosed is a copy of my progress note that details my  findings and recommendations.   Thank you for the opportunity to participate in your patient's care.    Sincerely,      Barbette Hair. Arlyce Dice, MD,FACG  Electronically Signed    RDK/MedQ  DD: 05/02/2007  DT: 05/02/2007  Job #: [id number]

## 2010-08-17 NOTE — Op Note (Signed)
NAME:  [name], [facility]              ACCOUNT NO.:  [id number]   MEDICAL RECORD NO.:  [id number]          PATIENT TYPE:  OIB   LOCATION:  5151                         FACILITY:  [facility]   PHYSICIAN:  Ollen Gross. Vernell Morgans, M.D. DATE OF BIRTH:  19-Jul-1970   DATE OF PROCEDURE:  05/31/2007  DATE OF DISCHARGE:                               OPERATIVE REPORT   PREOPERATIVE DIAGNOSIS:  Biliary colic.   POSTOPERATIVE DIAGNOSIS:  Biliary colic.   PROCEDURE:  Laparoscopic cholecystectomy with intraoperative  cholangiogram.   SURGEON:  Ollen Gross. Vernell Morgans, M.D.   ASSISTANT:  Currie Paris, M.D.   ANESTHESIA:  General endotracheal.   PROCEDURE:  After informed consent was obtained, the patient was brought  to the operating room and placed in the supine position on the operating  room table.  After adequate induction of general anesthesia, the  patient's abdomen was prepped with Betadine and draped in the usual  sterile manner.  The area below the umbilicus was infiltrated with 0.25%  Marcaine.  A small incision was made with a 15 blade knife.  This  incision was carried down through the subcutaneous tissue bluntly with a  Kelly clamp and [employer]-Nav[employer] retractors until the linea alba was  identified.  The linea alba was incised with a 15 blade knife and each  side was grasped with Kocher clamps and elevated anteriorly.  The  preperitoneal space was then probed bluntly with a hemostat until the  peritoneum was opened and access was gained to the abdominal cavity.  A  0 Vicryl pursestring stitch was placed in the fascia around the opening  and a Hasson cannula was placed through the opening and anchored in  place with the previously-placed Vicryl pursestring stitch.  The abdomen  was then insufflated with carbon dioxide without difficulty.  A  laparoscope was inserted through the Hasson cannula and it appeared as  though there were some filmy adhesions at the end of the trocar.  We  were able to drive  around these adhesions and into the right upper  quadrant, where everything appeared to be free.  Next the epigastric  region was infiltrated with 0.25% Marcaine.  A small incision was made  with a 15 blade knife and 10-mm port was placed bluntly through this  incision into the abdominal cavity under direct vision.  Sites were then  chosen laterally on the right side of the abdomen for replacement of 5-  mm ports.  Each of these areas was infiltrated with 0.25% Marcaine.  Small stab incisions were made with a 15 blade knife and 5-mm ports were  placed bluntly through these incisions into the abdominal cavity under  direct vision.  The laparoscope was then moved to the epigastric port  and using some laparoscopic scissors and dissector, some of these filmy  adhesions overlying the Hasson port site were taken down.  This allowed  free access to the port.  The laparoscope was then moved back to the  Hasson cannula.  The [employer] grasper was placed in the lateral-  most 5-mm port used and used to grasp  the dome of the gallbladder and  elevate it anteriorly and superiorly.  Another blunt grasper was placed  through the other 5-mm port and used to retract on the body and neck of  the gallbladder.  A dissector was then placed through the epigastric  port and some omental adhesions to the body of the gallbladder were  taken down sharply with the electrocautery.  The peritoneal reflection  at the gallbladder neck area was then opened sharply also with the  electrocautery.  Blunt dissection was carried out in this area until the  gallbladder neck-cystic duct junction was readily identified and a good  window was created.  A single clip was placed on the gallbladder neck.  A small ductotomy was made just below the clip.  A 14-gauge Angiocath  was placed percutaneously through the anterior abdominal wall under  direct vision.  A Reddick cholangiogram catheter was placed through the  Angiocath  and flushed.  The Reddick catheter was then placed within the  cystic duct and anchored in place with a clip.  Cholangiogram was  obtained that showed no filling defects, good emptying in the duodenum  and adequate length on the cystic duct.  The anchoring clip and catheter  were then removed from the patient.  Three clips were placed proximally  on the cystic duct and the duct was divided between the two sets of  clips.  Posterior to this the cystic artery was identified and again  dissected bluntly in a circumferential manner until a good window was  created.  Two clips were placed proximally and one distally on the  artery and the artery was divided between the two/  next a laparoscopic  hook cautery device was used to separate the gallbladder from the liver  bed.  Prior to completely detaching the gallbladder from the liver bed,  the liver bed was inspected and several small bleeding points coagulated  with the electrocautery until the area was completely hemostatic.  The  gallbladder was then detached the rest of the way from the liver bed  with the electrocautery without difficulty.  The laparoscopic bag was  then inserted through the epigastric port and the gallbladder placed  within the bag and the bag was sealed.  The abdomen was then irrigated  with copious amounts of saline until the effluent was clear.  The  laparoscope was then moved to the epigastric port.  A gallbladder  grasper was placed through the Hasson cannula and used to grasp the bag.  The bag with the gallbladder was removed through the infraumbilical port  without difficulty.  The fascial defect was closed with the previous-  placed Vicryl pursestring stitch as well with another figure-of-eight 0  Vicryl stitch.  The rest of the ports were removed under direct vision  and were found to be hemostatic.  The gas was allowed to escape.  The  skin incisions were all closed with 4-0 Monocryl subcuticular stitches.   Dermabond dressings were applied.  The patient tolerated the procedure  well.  At the end of the case all needle, sponge and instrument counts  were correct.  The patient was then awakened and taken recovery in  stable condition.      Ollen Gross. Vernell Morgans, M.D.  Electronically Signed     PST/MEDQ  D:  05/31/2007  T:  06/01/2007  Job:  [id number]

## 2010-08-17 NOTE — Assessment & Plan Note (Signed)
 HEALTHCARE                         GASTROENTEROLOGY OFFICE NOTE   AIRIEL, OBLINGER                     MRN:          [id number]  DATE:05/04/2007                            DOB:          July 16, 1970    PROBLEM:  Nausea, vomiting, and abdominal pain.   HISTORY OF PRESENT ILLNESS:  [name] has returned for re-evaluation.  Her symptoms continue. She complains of constant nausea with episodes of  vomiting. She has had loose stools as well. She has had temperatures up  to 100.4. She still complains of right upper quadrant pain. The pain  radiates through to her back. [name] claims that Zofran has not  helped her nausea.   LABORATORY DATA:  From today includes white count of 9.2. Hemoglobin  12.3. LFT's and amylase were normal. A hepatobiliary scan also was  entirely normal.   PHYSICAL EXAMINATION:  VITAL SIGNS:  Pulse 84, blood pressure 116/68,  weight 220.  ABDOMEN:  Without mass, tenderness, or organomegaly.   IMPRESSION:  Persistent nausea and right upper quadrant pain with loose  stools. Despite the negative Hida scan and ultrasound, I remain  suspicious for acute acalculous cholecystitis. Active peptic disease is  less likely, though ought to be ruled out, as well as consideration of a  persistent gastroenteritis.   RECOMMENDATIONS:  1. Upper endoscopy.  2. Empiric therapy with Ciprofloxacin 500 mg twice daily.  3. If symptoms are not improved and endoscopy is negative, then I      would consider surgical evaluation for possible cholecystectomy.  4. Empiric therapy with Aciphex 20 mg daily.  5. Check urinalysis.     Barbette Hair. Arlyce Dice, MD,FACG  Electronically Signed    RDK/MedQ  DD: 05/04/2007  DT: 05/05/2007  Job #: [id number]   cc:   Titus Dubin. Alwyn Ren, MD,FACP,FCCP

## 2010-08-20 NOTE — Op Note (Signed)
NAME:  STUMP, [place]              ACCOUNT NO.:  [id number]   MEDICAL RECORD NO.:  [id number]          PATIENT TYPE:  INP   LOCATION:  0474                         FACILITY:  [facility]   PHYSICIAN:  Petra Kuba, M.D.    DATE OF BIRTH:  [birth year]   DATE OF PROCEDURE:  06/22/2004  DATE OF DISCHARGE:                                 OPERATIVE REPORT   PROCEDURE:  Colonoscopy with biopsy.   INDICATIONS:  Abdominal pain, nausea, and diarrhea. Consent was signed after  risks, benefits, methods, and options thoroughly discussed prior to any pre-  medications given, both yesterday and today.   MEDICINES USED:  Demerol 120, Versed 13.   PROCEDURE NOTE:  Rectal inspection pertinent for external hemorrhoids,  small. Digital exam was negative. Video pediatric adjustable colonoscope was  inserted, and with some difficulty due to some tortuosity and looping with  rolling her on back and then on her right side, we were able to advance to  the cecum and into the terminal ileum which was normal. Photo documentation  was obtained. On insertion, rare right sided transverse diverticula were  seen but no other abnormalities. Scattered biopsies of the TI were obtained  and put in the first container, and random colon biopsies were obtained and  put in the second container. On slow withdraw through the colon, the prep  was adequate. There was some liquid stool that required washing and  suctioning. Scope was slowly withdrawn through the colon, and one right  sided diverticula seen mentioned above. No other abnormalities were seen.  Anorectal pull through and retroflexion once back in the rectum confirmed  some small hemorrhoids. The scope was straightened and readvanced short ways  up the left side of the colon. Air was suctioned. Scope removed. The patient  tolerated the procedure well. There were no obvious immediate complications.   ENDOSCOPIC DIAGNOSES:  1.  Internal and external hemorrhoids.  2.   Rare right transverse diverticula.  3.  Otherwise within normal limits to the terminal ileum. Status post random      biopsies throughout to rule out microscopic colitis.   PLAN:  Await pathology. I would like her to see Darvin Neighbours this week just  make sure the kidney stones are not playing a role with her pain and  symptoms, even though she says her pain is different. That is all that  showed up so far in all of her tests she has had. She is to follow up on  Monday with Dr. Daphine Deutscher, and I have asked her to keep that  appointment because she still might need an empiric cholecystectomy or at  least lysis of adhesion at the time of laparoscope to possibly help her  symptoms. Since I have no further GI workup at this time and will go ahead  and try Librax for the next week to see how that helps and okay for  discharge in outpatient management.      MEM/MEDQ  D:  06/22/2004  T:  06/22/2004  Job:  [id number]   cc:   Titus Dubin. Alwyn Ren, M.D. [facility]  Ronald L. Earlene Plater, M.D.  [address]  [place]  [place] [address]  Fax: [phone]   [place]. Daphine Deutscher, MD  [address], [address]  [place]  [place] [address]

## 2010-08-20 NOTE — Op Note (Signed)
[facility]  Patient:    [name], [name]                     MRN: [id number] Proc. Date: 11/03/99 Adm. Date:  [id number] Attending:  Donne Hazel                           Operative Report  PREOPERATIVE DIAGNOSES:       1. Chronic and acute pelvic pain.                               2. Probable recurrent endometriosis.  POSTOPERATIVE DIAGNOSES:      1. Chronic and acute pelvic pain.                               2. Probable recurrent endometriosis.  OPERATION:                    1. Laparoscopy.                               2. Laser ablation of endometriosis.                               3. Left salpingo-oophorectomy.  SURGEON:                      Willey Blade, M.D.  ANESTHESIA:                   General endotracheal.  ESTIMATED BLOOD LOSS:         50 cc.  FINDINGS:                     At the time of surgery, the pelvis was visualized thoroughly.  Remarkable in the pelvis was recurrent endometriosis. This was scattered throughout the pelvis including the posterior cul-de-sac, and the left and right pelvic side wall.  There was a 3 cm corpus luteum cyst of the right ovary which was drained.  The left ovary was imbedded into the left pelvic side wall and encased with adhesions and somewhat very, very much adhesed to the left pelvic side wall.  The bowel was visualized and noted to be normal.  The upper abdomen was also normal.  DESCRIPTION OF PROCEDURE:     The patient was taken to the operating room where general endotracheal anesthetic was administered.  The patient was placed on the operating table in the dorsolithotomy position.  The abdomen, perineum, and vagina was prepped and draped in the usual sterile fashion with Betadine and sterile drapes.  The bladder was emptied with a red rubber catheter.  A sponge forceps was placed in the vagina to locate the vaginal cuff.                                Next, a small vertical  infraumbilical skin incision was made through which a Veress needle was inserted atraumatically into the abdominal cavity.  A pneumoperitoneum was created by insufflating 2.5 L of carbon dioxide gas.  The Veress needle was removed and a disposable laparoscopic trocar  was inserted atraumatically into the abdominal cavity.  An accessory incision was made two fingerbreadths above the pubic symphysis and in the midline.  Through this, a 5 mm trocar was inserted under direct, continuous laparoscopic guidance.                                Next, using the carbon dioxide laser, the pelvis was freed of any existing endometriosis.  This was quite abundant, most notably in the right pelvic side wall.  All areas were small, but quite numerous.  The areas of endometriosis were laser vaporized using carbon dioxide laser carefully, using 15 watts of power in the continuous mode.  This was done systematically.  Also, the left round ligament was transected with the laser and the retroperitoneal space entered with sharp dissection using the laser.  The _______ irrigator was used to irrigate the area and next, the left ovary was removed.  The ovary was grasped and elevated through the anterior abdominal wall.  The infundibulopelvic ligament was cauterized using the bipolar cautery.  This was done with a burn and cut technique.  The area was thoroughly cauterized and the ovary dissected free using the laparoscopic scissors.  The ovary was then morcellated and removed through the umbilical trocar.  A corpus luteum cyst was noted on the right ovary.  This was entered to make sure this was not an endometrioma.  This was entered and cauterized with only old blood noted.  No evidence of endometriosis was identified in the cyst.  The right ovary was otherwise normal.  The pelvis was thoroughly irrigated with copious amounts of irrigant and surgical hemostasis noted. Approximately 300 cc of lactated Ringers was  left inside the abdomen to hopefully prevent adhesions.  All abdominal instruments were removed and the gas was allowed to escape from the abdomen.  Attention was then turned to closure.                                The small umbilical side was closed first with three interrupted sutures of 0 Vicryl.  The skin was reapproximated with multiple interrupted sutures of 3-0 Vicryl, ________.  A sterile dressing applied.  Ten cubic centimeters of 0.25% Marcaine was used to infiltrate these two small incisions.  All vaginal instruments were removed.                                The patient was awakened, extubated, and taken to the recovery room in good condition.  There were no apparent operative complications.  Blood loss was 50 cc.  The patient did receive IV Cefotan prior to the procedure. DD:  11/03/99 TD:  11/04/99 Job: [id number]

## 2010-08-20 NOTE — Op Note (Signed)
NAME:  STUMP, [name]              ACCOUNT NO.:  [id number]   MEDICAL RECORD NO.:  [id number]          PATIENT TYPE:  INP   LOCATION:  0474                         FACILITY:  [facility]   PHYSICIAN:  Petra Kuba, M.D.    DATE OF BIRTH:  04-23-1970   DATE OF PROCEDURE:  06/21/2004  DATE OF DISCHARGE:                                 OPERATIVE REPORT   PROCEDURE:  Esophagogastroduodenoscopy with biopsy.   INDICATIONS FOR PROCEDURE:  Nausea, vomiting and right upper quadrant  abdominal pain, nondiagnostic workup to date. Also with diarrhea.   Consent was signed after risks, benefits, methods, and options were  thoroughly discussed with the patient prior to any premeds. Also discussed  by Dr. Randa Evens yesterday.   MEDICINES USED:  Demerol 90, Versed 8.   DESCRIPTION OF PROCEDURE:  The video endoscope was inserted by direct  vision, esophagus was normal. She might have had a tiny hiatal hernia. The  scope passed into the stomach, advanced to the antrum where a minimal amount  of antritis was seen and into a duodenal bulb again with some minimal  bulbitis around the C loop to a normal second and probably third maybe even  fourth part of the duodenum which appeared normal. A few scattered duodenal  biopsies were obtained to rule out any malabsorption causes of her diarrhea.  The scope was slowly withdrawn back to the bulb which confirmed the above  findings. The scope was withdrawn back to the stomach and retroflexed.  Angularis, cardia, fundus, lesser and greater curve were normal on  retroflexed visualization. Straight visualization in the stomach did not  reveal any additional findings. Two biopsies of the antrum and two of the  proximal stomach were obtained for pathology to rule out helicobacter. Air  was suctioned, scope slowly withdrawn. Again a good look at the esophagus  was normal. The scope was removed. The patient tolerated the procedure well.  There was no obvious or immediate  complication.   ENDOSCOPIC DIAGNOSIS:  1.  Questionable tiny hiatal hernia.  2.  Minimal antritis bulbitis status post biopsy.  3.  Otherwise normal esophagogastroduodenoscopy status post duodenal biopsy      to rule out malabsorption.   PLAN:  Await pathology. Colonoscopy tomorrow if nondiagnostic will refer  back to Dr. Daphine Deutscher for consideration of laparoscopic cholecystectomy, etc.      MEM/MEDQ  D:  06/21/2004  T:  06/21/2004  Job:  [id number]   cc:   [place] Daphine Deutscher, MD  [address]  [place]  [place] [address]   Titus Dubin. Alwyn Ren, M.D. [facility]

## 2010-08-20 NOTE — H&P (Signed)
NAME:  [name], [name]                        ACCOUNT NO.:  [id number]   MEDICAL RECORD NO.:  [id number]                   PATIENT TYPE:  AMB   LOCATION:  SDC                                  FACILITY:  [facility]   PHYSICIAN:  Tracie Harrier, M.D.              DATE OF BIRTH:  1971-01-23   DATE OF ADMISSION:  DATE OF DISCHARGE:                                HISTORY & PHYSICAL   HISTORY OF PRESENT ILLNESS:  The patient is a 40 year old female status post  total vaginal hysterectomy and eventual bilateral salpingo-oophorectomy.  The patient was admitted for a persistent difficulty with a left Bartholin  abscess.  The patient was initially seen about a month ago for excision of a  Bartholin abscess.  She has since had trouble with this area including a  hematoma and persistent fever and pain.  After dealing with this area  conservatively, we have decided to proceed with excision of this gland under  general anesthesia.  Different treatment options were reviewed.  This has  been a lingering, persistent problem for her.   PAST MEDICAL HISTORY:  1. History of chronic and acute pelvic pain.  2. History of endometriosis.  3. History of kidney stones.  4. History of seasonal allergies.  5. History of recent back surgery.   PAST SURGICAL HISTORY:  1. Status post LAVH--2000.  2. Ankle surgery x3.  3. D&C x5.  4. Tonsillectomy.  5. Laparotomy.  6. Laparoscopy x5.  7. Laparoscopic salpingo-oophorectomy.  8. Laparoscopic right salpingo-oophorectomy.  9. Recent back surgery.   OBSTETRICAL HISTORY:  SAB x2.   CURRENT MEDICATIONS:  Are multiple.  Most notably Estratest for hormone  replacement therapy.   ALLERGIES:  TYLOX and IVP DYE.   PHYSICAL EXAMINATION:  VITAL SIGNS:  Stable, blood pressure 114/76.  GENERAL:  She is a well-developed, well-nourished female in no acute  distress.  HEENT:  Within normal limits.  NECK:  Supple without adenopathy or thyromegaly.  HEART:  Regular rate  and rhythm without murmur, gallop, or rub.  LUNGS:  Clear to auscultation.  BREASTS:  Exam in deferred but has been normal within the last year.  ABDOMEN:  Soft and benign without masses, tenderness, hernia, or  organomegaly.  EXTREMITIES:  Neurologically grossly normal.  BACK:  Has a well-healed recent surgical incision.  PELVIC:  Normal external female genitalia with vulvar swelling on the left.  There is a 3 x 3 cm diffuse swelling of the left labia majora consistent  with a Bartholin abscess.  The vagina is otherwise clear.  The pelvic exam  is unremarkable.   ASSESSMENT:  Persistent left Bartholin abscess.   PLAN:  Excision of left Bartholin gland under anesthesia.   DISCUSSION:  The risks and benefits of this procedure discussed with the  patient.  The risks of bleeding, infection, risk of injury to surrounding  organs was reviewed.  Also the patient was allowed  to ask questions,  different treatment options were reviewed.  She requested surgical excision.                                               Tracie Harrier, M.D.    REG/MEDQ  D:  02/05/2002  T:  02/05/2002  Job:  [id number]

## 2010-08-20 NOTE — H&P (Signed)
[facility]  Patient:    [name], [name]                     MRN: [id number] Adm. Date:  [id number] Attending:  Donne Hazel                         History and Physical  HISTORY OF PRESENT ILLNESS:   [name] is a 40 year old female gravida 2 para 0, A 2, status post vaginal hysterectomy.  The patient is admitted at this time for left salpingo-oophorectomy for recurrent pelvic pain and recurrent endometriosis.  She has a history of chronic and acute pelvic pain, and once again is suffering with left pelvic pain.  We discussed different treatment options, and she requested operative removal of the left ovary.  This is where most of her pain has been from.  The risks and benefits of this operation discussed.  MEDICAL HISTORY:              1. History of chronic and acute pelvic pain. 2. History of endometriosis.  3. History of kidney stones.  4. History of seasonal allergies.  SURGICAL HISTORY:             1. Status post laparoscopically-assisted vaginal hysterectomy, 2000.  2. Ankle surgery x 3.  3. D&C x 5.  4. Tonsillectomy. 5. History of laparotomy.  6. Laparoscopy x 5.  OBSTETRICAL HISTORY:          SAB x 2 during second trimester.  CURRENT MEDICATIONS:          Prevacid, Claritin, and allergy shots.  ALLERGIES:                    TYLOX and IVP DYE.  PHYSICAL EXAMINATION:  VITAL SIGNS:                  Stable, blood pressure 144/97, pulse 69, temperature 100, respirations 16.  GENERAL:                      She is a well-developed, well-nourished female in no acute distress.  HEENT:                        Within normal limits.  NECK:                         Supple without adenopathy or thyromegaly.  HEART:                        Regular rate and rhythm without murmur, gallop, or rub.  LUNGS:                        Clear to auscultation.  BREAST:                       Done recently in the office was normal, but is deferred  upon admission.  ABDOMEN:                      Benign and nontender.  EXTREMITIES AND NEUROLOGIC:   Grossly normal.  PELVIC:                       Normal external female genitalia, vagina clear. The  uterus and cervix are surgically absent.  The adnexa are mildly tender without mass.  The right adnexa is clear without masses and only mild tenderness.  ADMITTING DIAGNOSIS:          1. Status post laparoscopically-assisted vaginal                                  hysterectomy.                               2. Chronic and acute pelvic pain.                               3. History of endometriosis - probable                                  recurrence.  PLAN:                         1. Laparoscopy.                               2. Laser ablation of endometriosis.                               3. Left salpingo-oophorectomy.  DISCUSSION:                   Risks and benefits of surgery explained to patient.  The risk of bleeding, infection, the risk of injury to surrounding organs explained.  The goals of surgery reviewed. DD:  11/03/99 TD:  11/03/99 Job: [id number]

## 2010-08-20 NOTE — Op Note (Signed)
NAME:  [name], [name]                        ACCOUNT NO.:  [id number]   MEDICAL RECORD NO.:  [id number]                   PATIENT TYPE:  OIB   LOCATION:  3014                                 FACILITY:  [facility]   PHYSICIAN:  Hewitt Shorts, M.D.            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   PROCEDURES:  1. Bilateral L5-S1 lumbar laminotomy with foraminotomy.  2. Left L5-S1 lumbar microdiskectomy.  3. Right L4-5 lumbar laminotomy and foraminotomy.   SURGEON:  Hewitt Shorts, M.D.   ASSISTANT:  Payton Doughty, M.D.   ANESTHESIA:  General endotracheal.   INDICATIONS:  The patient is a 40 year old woman who presented with a right  lumbar radiculopathy, was found by MRI scan to have degenerative disk  disease at L4-5 and L5-S1 with superimposed disk herniations central and to  the right at L4-5 and central and to the left at L5-S1.  A decision was made  to proceed with exploration on the right side at L4-5 and bilaterally at L5-  S1.   DESCRIPTION OF PROCEDURE:  Patient brought to the operating room and placed  under general endotracheal anesthesia.  The patient was turned to the prone  position, and the lumbar region was prepped with Betadine soap and solution  and draped in a sterile fashion.  The midline was infiltrated with local  anesthetic with epinephrine and then a midline incision was made after an x-  ray had been taken to identify the L4-5 and L5-S1 levels.  Dissection was  carried down through the subcutaneous tissue to the level of the lumbodorsal  fascia, which was incised bilaterally.  The paraspinal  muscles were  dissected from the spinous processes and laminae in subperiosteal fashion  and self-retaining retractors were placed, and then we identified the L4-5  and L5-S1 interlaminar spaces.  Laminotomy was performed bilaterally at L5-  S1 and on the right side at L4-5 using the [facility] drill and [place]  punches.  The microscope was then draped and brought into the field to  provide additional magnification, illumination, and visualization, and the  remainder of the procedure was performed using microdissection and  microsurgical technique.   The ligamentum flavum was carefully removed at each laminotomy, and then we  identified the thecal sac and nerve roots at each level, the L5 nerve root  at the L4-5 level and the S1 nerve roots at the L5-S1 level.  We then  examined the epidural space through numerous epidural veins at each of these  levels.  These were coagulated as necessary, and then we examined the disks.  At L4-5 the disk was degenerated and bulging, but we did not find a discrete  disk herniation.  We performed a moderate laminotomy and foraminotomy for  the right L5 nerve root.  It was not felt that diskectomy would further the  decompression.  At L5-S1 we found the right S1 nerve root to be quite  irritable; however, the disk herniation was as seen on the MRI, slightly  more toward the left than to the right, and therefore it was decided to  perform the diskectomy from the left side.  We therefore incised the annulus  and performed a thorough diskectomy using a variety of pituitary rongeurs.  Particular attention was placed at removing the medial fragment and as we  performed the diskectomy, we examined the epidural space from both the left  and the right side in order to progressively decompress the thecal sac and  nerve roots and as this was done, in fact, the nerve root became less  irritable.  All loose fragments of disk material were removed from within  the  disk space.  Good decompression of the thecal sac and nerve roots was  achieved.  We then performed foraminotomies for the S1 nerve roots  bilaterally as well.  We then checked for and established hemostasis once  again with the use of both bipolar cautery as well as Gelfoam soaked in  thrombin.  All the Gelfoam, though, was removed prior to closure.  Once  hemostasis was established, we infiltrated 2 cc of fentanyl and 80 mg of  Depo-Medrol into the epidural space and then proceeded with closure.  The  deep fascia closed with interrupted, undyed 1 Vicryl sutures, the  subcutaneous layer was closed in two layers with interrupted, undyed 1  Vicryl sutures, and the subcutaneous and subcuticular were closed with  interrupted, inverted 2-0 undyed Vicryl sutures.  The skin was  reapproximated with Dermabond.  The patient tolerated the procedure well.  The estimated blood loss was 400 cc.  The sponge and needle count were  correct.  Following surgery, the patient is to be turned back to a supine  position, reversed from the anesthetic, extubated, and transferred to the  recovery room for further care.                                                Hewitt Shorts, M.D.    RWN/MEDQ  D:  11/12/2001  T:  11/15/2001  Job:  [id number]

## 2010-08-20 NOTE — Op Note (Signed)
   NAME:  [name], [name]                        ACCOUNT NO.:  [id number]   MEDICAL RECORD NO.:  [id number]                   PATIENT TYPE:  AMB   LOCATION:  SDC                                  FACILITY:  [facility]   PHYSICIAN:  Tracie Harrier, M.D.              DATE OF BIRTH:  [birth year]   DATE OF PROCEDURE:  02/05/2002  DATE OF DISCHARGE:                                 OPERATIVE REPORT   PREOPERATIVE DIAGNOSIS:  Left Bartholin abscess.   POSTOPERATIVE DIAGNOSIS:  Left Bartholin abscess.   PROCEDURE:  Excision of left Bartholin gland.   SURGEON:  Tracie Harrier, M.D.   ANESTHESIA:  MAC with local.   FINDINGS:  At time of excision of the left Bartholin gland an approximate 3  x 3 cm hematoma was excised.   DESCRIPTION OF PROCEDURE:  The patient was taken to the operating room where  a MAC anesthetic was administered.  The patient was placed on the operating  table in the dorsal lithotomy position.  The perineum and vagina were  prepped and draped in the usual sterile fashion with Betadine and sterile  drapes.  Next, a small 4 cm incision was made just exterior to the introitus  and the left vulva posteriorly.  This was carried down sharply with the  scissors.  This area was numbed with 17 cc of 1% Xylocaine prior to the  incision.  The Bartholin gland was then grasped with an Allis clamp and  dissected free using the Metzenbaum scissors.  Deep stitches were then  placed in the bed of the Bartholin gland, and this was done with four  interrupted sutures of 3-0 chromic.  As mentioned, an approximate 3 x 3 cm  hematoma was excised.  Next, the hemostasis was noted.  The vulvar skin was  then closed with multiple interrupted sutures of 3-0 Vicryl Rapide.  The  patient was awakened and taken to the recovery room in good condition.  There were no perioperative complications.  Blood loss was 50 cc.                                               Tracie Harrier, M.D.    REG/MEDQ   D:  02/05/2002  T:  02/05/2002  Job:  [id number]

## 2010-08-20 NOTE — Assessment & Plan Note (Signed)
[facility]                                   ON-CALL NOTE   NIEMAH, SCHWEBKE                       MRN:          [id number]  DATE:01/31/2006                            DOB:          05-Jul-1970    A patient of Dr. Alwyn Ren, called from [phone].  Date of birth November 19, 1970.  She called at 6:20 p.m. on January 31, 2006, stating she blacked out  at home after checking her blood pressure and seeing that it was 170/110.  The patient states she has a headache and does not feel well at all.  I told  the patient she should go to the emergency room but she would need to either  call 911 or have somebody else take her as she should not drive herself.  The patient understood and will have her mother take her to the emergency  room.     Lelon Perla, DO  Electronically Signed    Shawnie Dapper  DD: 01/31/2006  DT: 02/01/2006  Job #: [id number]   cc:   Titus Dubin. Alwyn Ren, MD,FACP,FCCP

## 2010-08-20 NOTE — H&P (Signed)
NAME:  STUMP, [facility]              ACCOUNT NO.:  [id number]   MEDICAL RECORD NO.:  [id number]          PATIENT TYPE:  EMS   LOCATION:  ED                           FACILITY:  [facility]   PHYSICIAN:  James L. Malon Kindle., M.D.DATE OF BIRTH:  [birth year]   DATE OF ADMISSION:  06/20/2004  DATE OF DISCHARGE:                                HISTORY & PHYSICAL   REASON FOR ADMISSION:  Intractable abdominal pain with nausea and vomiting  and diarrhea.   HISTORY:  A nice 40 year old woman who had been well until three weeks ago.  She is a patient of Dr. Jobe Gibbon, has seen several physicians over  the past three weeks.  She has had right upper quadrant pain that has been  worse with eating, would initially come and go, lately it has become  constant.  It has been associated with nausea and vomiting and most recent  profuse diarrhea.  Since the patient has gotten worse, she has had 10-20  bowel movements a day as well as nocturnal stools, has had nausea and  vomiting.  The pain gets worse after eating.  It is located in her right  upper quadrant, radiates to her back and associated with frequent nausea and  vomiting but nearly always is associated with diarrhea.  There is no burning  with urination.  She has had a low grade temperature of around 100 but no  fever and chills.  She saw Dr. Ewing Schlein with these symptoms.  A gallbladder  ultrasound was ordered and was normal.  She became worse over the past  several days, saw Dr. Alwyn Ren in the office last week who referred her to Dr.  Daphine Deutscher.  Dr. Daphine Deutscher ordered a hepatobiliary scan with stimulation which  revealed a normal gallbladder ejection fraction.  She has continued to have  progression of her symptoms and came to the emergency room tonight.  A CT  scan had already been ordered for next week.  In the emergency room tonight,  a CT scan is reported to be normal.  I have not as of yet had a chance to  review this film.  The patient has required  Dilaudid and Phenergan for her  pain and nausea.  She has been unable to eat or drink anything today and  feeling dizzy.  She has thrown up her medicines.  It was felt that she  needed inpatient evaluation for pain and nausea control and for further  diagnosis and therapy.   CURRENT MEDICATIONS:  1.  Toprol 100 mg daily.  2.  Benicar 20 mg daily.  3.  Estratest three times weekly.  4.  Premarin one tablet daily.  5.  Paxil three times weekly.   She is allergic to Tylox, IVP dye, Nubain and Septra.   MEDICAL HISTORY:  The only chronic medical problem is hypertension.  She has  had a previous laparoscopy in 1991 by Dr. Daphine Deutscher, was told that her appendix  had nearly ruptured.  This was removed and apparently some sort of mass was  removed from the vicinity of her pancreas.  She is unable  to give me anymore  information.  She has had a hysterectomy and removal of one ovary and  subsequently they had to come back and remove the second ovary all due to  endometriosis.  She has also had an L4-L5 laminectomy.   FAMILY HISTORY:  The father has had an MI and bypass surgery as well as a  stroke.  The mother is basically healthy.  Both parents have hypertension.  Her sister had her gallbladder removed and apparently all the tests were  negative and her gallbladder was gangrenous.  An uncle has had colon cancer.  Another uncle has had some sort of colitis.   SOCIAL HISTORY:  She is divorced.  She has had two miscarriages, has no  living children.  She does not smoke, drinks occasionally.   REVIEW OF SYSTEMS:  Remarkable that she eats a very healthy diet, has been  trying to lose weight, works our religiously, has been feeling quite well up  until three weeks or so ago.  She has not traveled outside the country. She  has not taking any antibiotics in the previous six or eight weeks.  She has  no other symptoms.   PHYSICAL EXAMINATION:  VITAL SIGNS:  Temperature 98.4, pulse 101, blood   pressure 109/76.  GENERAL:  An alert, obese, nonicteric white female.  HEENT:  Eyes clear, nonicteric.  Extraocular movements intact.  Throat:  Mucous membranes are dry.  NECK:  Obese, supple.  No lymphadenopathy.  LUNGS:  Clear anteriorly and posteriorly.  HEART:  A regular rate and rhythm without murmurs or gallops.  ABDOMEN:  Nondistended.  There is a scar around her umbilicus from previous  laparoscopies.  Bowel sounds are increased and hyperactive.  There is marked  tenderness in the right upper quadrant but no guarding, rigidity or rebound.  I could not palpable a mass there.   PERTINENT LABORATORIES:  CBC, urinalysis and CMET are all normal.   ASSESSMENT:  Right upper quadrant pain, nausea and vomiting and diarrhea.  This may end up being the gallbladder.  Her sister apparently had to have  her gallbladder removed when all tests were negative.  Maybe if comes to  that with her.  I am concerned about the degree of diarrhea that she is  having.  This could well represent some sort of colitis.   PLAN:  Will admit, give pain control and nausea control.  Will start proton  pump inhibitors empirically, initiate a stool work up for Clostridium  difficile's, white cells, etc.  Will plan on endoscopy, unprepped  sigmoidoscopy tomorrow.      JLE/MEDQ  D:  06/20/2004  T:  06/21/2004  Job:  [id number]   cc:   Titus Dubin. Alwyn Ren, M.D. [facility]   [place]. Daphine Deutscher, MD  [address], [address]  [place]  [place] [address]   Petra Kuba, M.D.  [address]  [place]  [place] [address]  Fax: [phone]   Lucienne Minks, M.D.

## 2010-08-20 NOTE — Op Note (Signed)
[facility]  Patient:    [name]                     MRN: [id number] Proc. Date: 08/04/00 Adm. Date:  [id number] Attending:  Nelma Rothman Iii                           Operative Report  PREOPERATIVE DIAGNOSIS:  Right lower ureteral calculus.  POSTOPERATIVE DIAGNOSIS:  Right lower ureteral calculus.  OPERATION PERFORMED:  Cystourethroscopy, right retrograde ureteral pyelogram, right rigid ureteroscopy with holmium laser lithotripsy and stone basket extraction and placement of double J stent.  SURGEON:  Gaynelle Arabian, M.D.  ANESTHESIA:  General endotracheal.  ESTIMATED BLOOD LOSS:  Negligible.  TUBES:  24 cm 6 [place] surgitek double pigtail stent.  COMPLICATIONS:  None.  INDICATIONS FOR PROCEDURE:  Ms. Frances Sensor is a lovely 40 year old white female who has a history of recurrent kidney stone disease. She presented with gross hematuria and right flank pain. She subsequently underwent KUB which revealed a questionable stone over th sacrum on the right side and underwent a CT scan without contrast which revealed bilateral intrarenal calculi and also a 6 mm right distal ureteral calculus with no definite hydroureter. With the pain and the large size of the stone, it was felt that manipulation was indicated. She understands the risks, benefits, and alternatives and wishes to proceed.  DESCRIPTION OF PROCEDURE:  The patient was placed in supine position and with proper general endotracheal anesthesia was placed in the dorsal lithotomy position and prepped and draped with Betadine in a sterile fashion. Cystourethroscopy was performed with a 22.5 French Olympus panendoscope utilizing the 12 and 70 degree lenses, the bladder was carefully inspected and noted to be normal and smooth walled. The ureteral orifices were in normal location and no urethral lesions were noted. A 6 French open ended catheter was placed into the distal ureter and  retrograde ureteral pyelogram was performed and there was some narrowing noted at the distal sacral area junction of the mid and lower third ureter and an obvious 6 mm stone at that point. Under fluoroscopic guidance, a 0.3 French Teflon coated guidewire was placed past the stone up into the right renal pelvis and the lower ureteral segment was balloon dilated with a 5 cm, 4 mm, 20 atmosphere burst pressure ureteral dilator to 10 atmospheres for 3 minutes. Following this, this was removed. Rigid ureteroscopy was then performed with an Olympus short thin ureteroscope. The stone was visualized and noted to be highly impacted. It was felt that it would be quite dangerous to try to basket it. It was felt that holmium laser lithotripsy was indicated and a 365 micron fiber was utilized with a setting of 0.5 joules and a repetition rate of 5. The stone was fragmented into multiple fragments and serially extracted with a nitinol basket. Reinspection of the entire lower two-thirds ureter revealed there were no perforations, it was widely patent and there were microscopic fragments only noted. Following this under fluoroscopic guidance, a 24 cm 6 [place] surgitek double pigtail stent was placed and noted to be in good position within the right renal pelvis and within the bladder. A pullout string was attached. The bladder was drained, the panendoscope was removed. The stone was retrieved and the patient was taken to the recovery room stable. DD:  08/04/00 TD:  08/04/00 Job: [id number]

## 2010-08-20 NOTE — H&P (Signed)
[facility]  PatientLINNELL, [name] Visit Number: [id number] MRN: [id number]          Service Type: Attending:  Guy Sandifer. Arleta Creek, M.D. Dictated by:   Guy Sandifer Arleta Creek, M.D. Adm. Date:  05/17/01                           History and Physical  CHIEF COMPLAINT:              Pelvic pain and endometriosis.  HISTORY OF PRESENT ILLNESS:   This patient is a 40 year old, single, white female, G2, P0, status post LAVH and status post left salpingo-oophorectomy for severe endometriosis.  She has also had multiple rounds of Lupron Depot. She has recurrent right lower quadrant pain which has become quite bad. Ultrasound on April 30, 2001, reveals the right ovary to contain a complex cyst measuring 2.1 cm with echogenic debris, possibly consistent with endometrioma.  We discussed options of management.  She is being admitted for laparoscopy.  The ovary will be removed if it is involved with endometriosis.  PAST MEDICAL HISTORY:         1. History of chronic and acute pelvic pain.                               2. History of endometriosis.                               3. History of kidney stones.                               4. Seasonal allergies.  PAST SURGICAL HISTORY:        1. Laparoscopic-assisted vaginal hysterectomy                                  in 2000.                               2. Ankle surgery x 3.                               3. D&C x 5.                               4. Tonsillectomy.                               5. Laparotomy.                               6. Laparoscopy x 5.                               7. Left salpingo-oophorectomy.  OBSTETRICAL HISTORY:          Miscarriage x 2.  MEDICATIONS:  Toprol, hydrochlorothiazide, Celexa, Nexium, Vicodin.  Recently finished a course of Augmentin.  ALLERGIES:                    TYLOX and IVP DYE.  REVIEW OF SYSTEMS:            Recent upper respiratory  infection, now clear.  PHYSICAL EXAMINATION:         Blood pressure 112/78.  NECK:                         Without thyromegaly.  LUNGS:                        Clear to auscultation.  HEART:                        Regular rate and rhythm.  BACK:                         Without CVA tenderness.  BREASTS:                      Without mass, retraction, or discharge.  ABDOMEN:                      Soft with mild right lower quadrant tenderness without masses or rebound.  PELVIC:                       Right adnexa moderately to severely tender which essentially compromises the exam.  Left adnexa nontender without masses.  EXTREMITIES:                  Grossly within normal limits.  NEUROLOGIC:                   Grossly within normal limits.  ASSESSMENT:                   Known endometriosis with recurrent right lower quadrant pain and complex ovarian cyst.  PLAN:                         Laparoscopy. Dictated by:   Guy Sandifer Arleta Creek, M.D. Attending:  Guy Sandifer Arleta Creek, M.D. DD:  05/11/01 TD:  05/11/01 Job: [id number]

## 2010-08-20 NOTE — H&P (Signed)
[facility]  Patient:    [name]                      MRN: [id number] Proc. Date: 04/02/99 Adm. Date:  [id number] Attending:  Donne Hazel Dictator:   [id number]                         History and Physical  HISTORY OF PRESENT ILLNESS:   [name] is a 40 year old female status post vaginal hysterectomy for endometriosis.  She is admitted at this time to undergo laparoscopy for severe pelvic pain which has once again returned.  The patient as a history of pelvic pain and history of endometriosis.  She continues to have left pelvic discomfort and requests surgical therapy at this time.  She has been on narcotic pain medication for this.  MEDICAL HISTORY:              1. History of pelvic pain.                               2. History of endometriosis.                               3. History of urolithiasis.                               4. History of allergies - seasonal.  SURGICAL HISTORY:             1. Status post LAVH 2000.                               2. Ankle surgery times three.                               3. D&C times five.                               4. Tonsillectomy.                               5. History of exploratory laparotomy.                               6. Laparoscopy times four.  OB HISTORY:                   Status post two second trimester pregnancy losses.  CURRENT MEDICATIONS:          Flovent p.r.n. and Darvocet.  ALLERGIES:                    Tylox.  PHYSICAL EXAMINATION:  VITAL SIGNS:                  Stable, blood pressure 148/96, temperature 98.8, pulse 77, respirations 16.  GENERAL:                      She is a well-developed, well-nourished female in no acute  distress.  HEENT:                        Within normal limits.  NECK:                         Supple without adenopathy or thyromegaly.  HEART:                        Regular rate and rhythm without murmurs,  gallop or rub.  LUNGS:                        Clear to auscultation without wheezing.  BREASTS:                      Done recently in the office was normal but is deferred upon admission.  ABDOMEN:                      Soft and benign without masses. No organomegaly.  EXTREMITIES:                  Grossly normal.  NEUROLOGIC:                   Grossly normal.  PELVIC:                       Normal external female genitalia.  Vagina clear. The uterus is surgically absent. The adnexa is significant for mild tenderness on the left side.  The right side is clear without mass and no tenderness. There is no  mass noted on the left also.  ADMISSION DIAGNOSES:          1. Status post vaginal hysterectomy.                               2. Chronic pelvic pain - recurrent.                               3. 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  PLAN:                         Laser laparoscopy.  DISCUSSION:                   The risks and benefits of surgery explained to patient.  The risk of bleeding, infection, risk of injury to organs explained. he patient was allowed to ask questions and wished to proceed. DD:  04/02/99 TD:  04/02/99 Job: [id number]

## 2010-08-20 NOTE — Op Note (Signed)
NAME:  [name], Frances              ACCOUNT NO.:  [id number]   MEDICAL RECORD NO.:  [id number]          PATIENT TYPE:  AMB   LOCATION:  NESC                         FACILITY:  [facility]   PHYSICIAN:  Ronald L. Earlene Plater, M.D.  DATE OF BIRTH:  Nov 12, 1970   DATE OF PROCEDURE:  07/09/2004  DATE OF DISCHARGE:                                 OPERATIVE REPORT   PREOPERATIVE DIAGNOSES:  1.  History of bilateral renal stones.  2.  Right flank pain.  3.  Questionable hydronephrosis on intravenous pyelogram and computed      tomography scan.   POSTOPERATIVE DIAGNOSIS:  1.  History of bilateral renal stones.  2.  Right flank pain.  3.  Questionable hydronephrosis on intravenous pyelogram and computed      tomography scan.   PROCEDURE:  Cystourethroscopy, right retrograde ureteral pyelogram.   SURGEON:  Ronald L. Earlene Plater, M.D.   ASSISTANT:  Alessandra Bevels. Chase Picket, FNP-C   ANESTHESIA:  LMA.   ESTIMATED BLOOD LOSS:  Negligible.   DRAINS:  None.   COMPLICATIONS:  None.   INDICATIONS FOR PROCEDURE:  [name] is a lovely 40 year old white female  who presented with right flank pain.  She has known history of bilateral  renal stones.  On CT scan, there was a mild right hydronephrosis, but no  stone was seen in the ureter.  AN IVP was performed which showed mild right  hydroureteronephrosis to the level of the iliac vessels on the right side.  It was hard to know whether it was scarring, a  radiolucent stone, etc.  After understanding the risks, benefits, and alternatives, she has elected  to proceed with cystourethroscopy and right retrograde pyelogram.   DESCRIPTION OF PROCEDURE:  The patient was placed in the supine position  after proper LMA anesthesia.  She was placed in the dorsal lithotomy  position and prepped and draped with Betadine in a sterile fashion.   Cystourethroscopy was performed with a 22.5 French Olympus panendoscope  utilizing the 12 and 70-degree lenses.  The bladder was  carefully inspected  and noted to be without lesion.  Efflux of clear urine was noted from the  normally placed ureteral orifices bilaterally.  Utilizing a 6 [place] open-  ended catheter, a right retrograde ureteral pyelogram was performed, and  although there was some peristaltic action at the level of the iliac vessels  on the right side, once it was distended, it opened widely.  There were no  intrinsic filling defects.  The inner collecting system appeared to be  normal, and static images were obtained fluoroscopically of all regions.  The 6 [place] open-ended catheter was removed.  The system appeared to drain  well.  The bladder was drained.  The panendoscope was removed.   The patient was taken to the recovery room stable.      RLD/MEDQ  D:  07/09/2004  T:  07/09/2004  Job:  [id number]

## 2010-08-20 NOTE — Discharge Summary (Signed)
NAME:  STUMP, [facility]              ACCOUNT NO.:  [id number]   MEDICAL RECORD NO.:  [id number]          PATIENT TYPE:  INP   LOCATION:  0474                         [facility]ACILITY[facility]   PHYSICIAN:  Petra Kuba, M.D.    DATE OF BIRTH:  [birth year]   DATE OF ADMISSION:  06/20/2004  DATE OF DISCHARGE:  06/22/2004                                 DISCHARGE SUMMARY   HISTORY:  This is repeat discharge dictation. Unfortunately, the other one  was not in the computer although my note did say I did it on March 21 - the  day of discharge.   The patient was admitted by my partner, Vilinda Boehringer, for right upper  quadrant pain, nausea, vomiting, and diarrhea. She underwent an endoscopy on  March 20 which showed a tiny hiatal hernia and minimal inflammation, and  then we proceeded with a colonoscopy on March 21 which was totally normal  and some random biopsies were obtained. After the colonoscopy - which she  tolerated well - she was deemed ready for discharge. She did have a CAT scan  on admission which showed bilateral renal stones with some mild to moderate  hydronephrosis and some tiny lymph nodes. O&P was negative. C. difficile  negative. C&S of the stool negative. Urinalysis negative. Liver tests,  lipase normal. CBCs were normal. Electrolytes normal. Sed rate normal.   DISCHARGE CONDITION:  Improved. There were no hospitalization complications  or problems.   DISCHARGE DIAGNOSIS:  Nausea/vomiting/abdominal pain, questionable etiology  - improved.   Discharge instructions were written in her discharge endoscopy sheet, which  unfortunately is not in the chart, but basically her diet was to be slowly  advanced. She was to follow up with Dr. Madison Hickman for continual workup  and plans, possibly a CCK PIPIDA scan. Call me for biopsies in 1 week and  call sooner if symptoms recur. Follow up with her urologist regarding her  kidney stones. Other discharge instructions from the endoscopy unit  were not  in the chart, but were given and reviewed with the patient.       MEM/MEDQ  D:  10/01/2004  T:  10/01/2004  Job:  [id number]

## 2010-08-20 NOTE — Op Note (Signed)
[facility]  Patient:    [name], [name] Visit Number: [id number] MRN: [id number]          Service Type: DSU Location: [facility] Attending Physician:  Soledad Gerlach Dictated by:   Guy Sandifer Arleta Creek, M.D. Proc. Date: 05/17/01 Admit Date:  05/17/2001                             Operative Report  PREOPERATIVE DIAGNOSES:       1. Right ovarian cyst.                               2. Pelvic pain.  POSTOPERATIVE DIAGNOSES:      1. Right ovarian cyst.                               2. Pelvic adhesions.                               3. Endometriosis.  OPERATION:                    Open laparoscopy with right salpingo-oophorectomy, lysis of adhesions and ablation of endometriosis.  SURGEON:                      Guy Sandifer. Arleta Creek, M.D.  ANESTHESIA:                   General with endotracheal intubation,                               Belva Agee, M.D.  ESTIMATED BLOOD LOSS:         Drops.  INDICATIONS AND CONSENT:      This patient is a 40 year old, single, white female, G2, P0, status post LAVH and LSO in the past with known endometriosis.  Details are dictated in the history and physical.  After a careful discussion with the patient on multiple occasions preoperatively, she requests right salpingo-oophorectomy.  Careful discussion of options of management of preservation of the ovary versus right salpingo-oophorectomy is undertaken. Careful discussion of menopause and possible treatment of symptoms is also undertaken.  The procedure is discussed and possible risks and complications are discussed including but not limited to infection, bowel, bladder, ureteral damage, bleeding requiring transfusion of blood products, possible transfusion reaction, HIV, and hepatitis acquisition, DVT, PE, pneumonia, residual ovarian syndrome, and possible stimulation of endometriosis with estrogen therapy. All questions have been answered and consent is  signed on the chart.  FINDINGS:  The omentum is densely adherent to the anterior abdominal wall beneath the level of the umbilicus.  However, upon close inspection, both with the 10 mm laparoscope through the umbilical trocar sleeve as well as with the 5 mm laparoscope through a left lower trocar sleeve, this contains omentum and no loops of bowel.  There were no closed loops of omentum.  In the pelvis, there are several small powder burn type lesions of endometriosis in the posterior cul-de-sac.  The right ovary has multiple filmy adhesions to the right pelvic sidewall as well as containing an apparent 1-2 cm cyst.  The course of the right ureter is carefully identified  and seen to be well clear of the area of surgery.  DESCRIPTION OF PROCEDURE:  The patient is taken to the operating room, placed in the dorsal supine position where general anesthesia is induced via endotracheal intubation.  She was then placed in the dorsal lithotomy position position where she was prepped abdominally with Hibiclens solution.  Bladder was straight catheterized and a ring clamp with a sponge is placed in the vagina for possible vaginal manipulation.  She is then draped in a sterile fashion.  Allis clamps are used to grasp the skin below the level of the umbilicus and the skin is entered and dissection is carried out to the fascia The fascia is then elevated with Kocher clamps and the fascia is carefully entered sharply.  Then exploration with the finger successfully enters the peritoneal cavity.  0 Vicryl sutures are then carefully placed at each angle of the fascia and are then used to hold the disposable Hasson trocar sleeve in place after it is bluntly placed through the incision.  Careful inspection reveals the findings as described above.  After careful transillumination, suprapubic and later left lower quadrant incisions are made and 5 mm nondisposable trocar sleeves are placed under direct  visualization without difficulty.  The right ovary is elevated and the adhesions are taken down sharply.  Then using the 5 mm laparoscope through the left lower trocar sleeve, the LigaSure instrument is used to take down the infundibulopelvic ligament and the remaining attachment of the ovary.  Excellent hemostasis is maintained.  Then switching back to the operative laparoscope, inspection again reveals good hemostasis.  The endocatch is then placed through the umbilical trocar sleeve while again using the 5 mm laparoscope in the left lower trocar sleeve. The ovary and attached tube are then placed in the endocatch and is removed through the umbilical incision without difficulty.  The umbilical trocar sleeve is then replaced and the pneumoperitoneum is recreated.  The endometriosis and the posterior cul-de-sac is inflated with bipolar cautery. Copious irrigation is carried out and again meticulous hemostasis is noted. Then 1% Nesacaine for a total of 20 cc is instilled into the peritoneal cavity over the point of surgery.  The inferior trocar sleeves are then removed, pneumoperitoneum is reduced and the umbilical trocar sleeve is removed.  The angled sutures of 0 Vicryl area then tied in the midline to close the fascia of the umbilical incision.  A 3-0 Vicryl suture is then used to close the subcuticular tissue in the umbilical incision.  Finally, interrupted mattress sutures are used to close the skin on the umbilical incision.  Incisions were all injected with a total of approximately 15 cc of 0.5% plain Marcaine. Dermabond was then used to close the inferior incision as well as being applied to the umbilical incision.  All counts were correct.  The patient is awakened and taken to the recovery room in stable condition.  DESCRIPTION OF PROCEDURE: Dictated by:   Guy Sandifer. Arleta Creek, M.D. Attending Physician:  Soledad Gerlach DD:  05/17/01 TD:  05/17/01 Job:  [id number]

## 2010-09-18 ENCOUNTER — Encounter: Payer: Self-pay | Admitting: Internal Medicine

## 2010-09-22 ENCOUNTER — Telehealth: Payer: Self-pay | Admitting: *Deleted

## 2010-09-22 ENCOUNTER — Ambulatory Visit (INDEPENDENT_AMBULATORY_CARE_PROVIDER_SITE_OTHER): Payer: Self-pay | Admitting: Internal Medicine

## 2010-09-22 ENCOUNTER — Encounter: Payer: Self-pay | Admitting: Internal Medicine

## 2010-09-22 VITALS — BP 138/86 | HR 72 | Temp 98.9°F | Wt 247.0 lb

## 2010-09-22 DIAGNOSIS — R21 Rash and other nonspecific skin eruption: Secondary | ICD-10-CM

## 2010-09-22 DIAGNOSIS — E162 Hypoglycemia, unspecified: Secondary | ICD-10-CM

## 2010-09-22 DIAGNOSIS — D72829 Elevated white blood cell count, unspecified: Secondary | ICD-10-CM

## 2010-09-22 DIAGNOSIS — E039 Hypothyroidism, unspecified: Secondary | ICD-10-CM

## 2010-09-22 DIAGNOSIS — G510 Bell's palsy: Secondary | ICD-10-CM

## 2010-09-22 DIAGNOSIS — R61 Generalized hyperhidrosis: Secondary | ICD-10-CM

## 2010-09-22 LAB — CBC WITH DIFFERENTIAL/PLATELET
Basophils Relative: 0.2 % (ref 0.0–3.0)
Eosinophils Relative: 0.2 % (ref 0.0–5.0)
HCT: 42.1 % (ref 36.0–46.0)
Hemoglobin: 14.2 g/dL (ref 12.0–15.0)
Lymphs Abs: 5.2 10*3/uL — ABNORMAL HIGH (ref 0.7–4.0)
MCV: 87.9 fl (ref 78.0–100.0)
Monocytes Absolute: 2 10*3/uL — ABNORMAL HIGH (ref 0.1–1.0)
Neutrophils Relative %: 68.1 % (ref 43.0–77.0)
RBC: 4.79 Mil/uL (ref 3.87–5.11)
WBC: 22.9 10*3/uL (ref 4.5–10.5)

## 2010-09-22 LAB — TSH: TSH: 0.92 u[IU]/mL (ref 0.35–5.50)

## 2010-09-22 LAB — BASIC METABOLIC PANEL
Calcium: 9.2 mg/dL (ref 8.4–10.5)
GFR: 79.48 mL/min (ref >60.00)
Potassium: 4 mEq/L (ref 3.5–5.1)
Sodium: 143 mEq/L (ref 135–145)

## 2010-09-22 LAB — HEPATIC FUNCTION PANEL
ALT: 26 U/L (ref 0–35)
Bilirubin, Direct: 0.1 mg/dL (ref 0.0–0.3)
Total Protein: 7.1 g/dL (ref 6.0–8.3)

## 2010-09-22 MED ORDER — AMOXICILLIN-POT CLAVULANATE 500-125 MG PO TABS: 1.0000 | ORAL_TABLET | Freq: Three times a day (TID) | ORAL | Status: AC

## 2010-09-22 MED ORDER — MOMETASONE FUROATE 0.1 % EX CREA: TOPICAL_CREAM | CUTANEOUS | Status: DC

## 2010-09-22 NOTE — Patient Instructions (Signed)
Report fever, chills , or purulent secretions

## 2010-09-22 NOTE — Progress Notes (Signed)
Addended byPecola Lawless on: 09/22/2010 05:56 PM   Modules accepted: Orders

## 2010-09-22 NOTE — Progress Notes (Signed)
  Subjective:    Patient ID: [name], female    DOB: Apr 29, 1970, 40 y.o.   MRN: [id number] ; HPI Bell's palsy treated @ [facility] in [facility] 6/13 with oral prednisone; this resolved her headaches but a rash started 6/18. Dr Corliss Skains diagnosed Lupus & Rxed MTX, prednisone  & plaquenil. Subsequently Dr Nickola Major changed Rx to plaquenil & Meloxicam. RASH Location: back of legs   Course: stable Self-treated with: hydrocortisone            Improvement with treatment: no History Pruritis: yes  Tenderness: no  New medications/antibiotics: yes,  Gabapentin increased 2 weeks ago for neuropathy, but she went back to 1 daily because of headaches  Tick/insect/pet exposure: no  Recent travel: no  New detergent, new clothing, or other topical exposure: no   Red Flags Feeling ill: no  Fever: no, but sweats  Mouth lesions: yes, "white spots on tongue before prednisone"  Facial/tongue swelling/difficulty breathing:  no, not since Nubain 2008  Diabetic or immunocompromised: yes, on plaquenil since 10/11       Review of Systems Hypoglycemic episodes; vision blurred OS  since Bell's. Polydipsia & polyphagia. No purulent secretions     Objective:   Physical Exam Gen.: Healthy and well-nourished in appearance. Alert, appropriate and cooperative throughout exam. Eyes: No corneal or conjunctival inflammation noted. Pupils equal round reactive to light and accommodation.  Extraocular motion intact. Nose: External nasal exam reveals no deformity or inflammation. Nasal mucosa are pink and moist. No lesions or exudates noted.  Mouth: Oral mucosa and oropharynx reveal no lesions or exudates. Teeth in good repair.No tongue deviation Neck: No deformities, masses, or tenderness noted. Range of motion &  . Thyroid  normal. Lungs: Normal respiratory effort; chest expands symmetrically. Lungs are clear to auscultation without rales, wheezes, or increased work of breathing. Heart: Normal rate and rhythm.  Normal S1 and S2. No gallop, click, or rub. No  murmur. Abdomen: Bowel sounds normal; abdomen soft and nontender. No masses, organomegaly or hernias noted. No clubbing, cyanosis, edema, or deformity noted. Range of motion  normal .Decreased  Strength to opposition in hands  normal.Joints normal. Nail health  good. Vascular: Carotid, radial artery, dorsalis pedis and dorsalis posterior tibial pulses are full and equal. No bruits present. Neurologic: Alert and oriented x3. Deep tendon reflexes symmetrical and normal. Decreased smile on L         Skin:papular lesions w/o pustules posterior thighs & calves Lymph: No cervical, axillary, or inguinal lymphadenopathy present. Psych: Mood and affect are normal. Normally interactive                                                                                         Assessment & Plan:  #1 rash, folliculitis suggested #2 sweats #3 polydipsia/ phagia 34 hypoglycemia Plan: see Orders

## 2010-09-22 NOTE — Progress Notes (Signed)
  Subjective:    Patient ID: [name], female    DOB: Sep 29, 1970, 40 y.o.   MRN: [id number]  HPI    Review of Systems     Objective:   Physical Exam        Assessment & Plan:  Critical WBC: 22.9; Augmentin will be sent to Pharmacy for possible folliculitis

## 2010-09-22 NOTE — Telephone Encounter (Signed)
Hope from [place] lab called and states pts white count is 22.9.

## 2010-09-23 ENCOUNTER — Telehealth: Payer: Self-pay | Admitting: Internal Medicine

## 2010-09-23 NOTE — Telephone Encounter (Signed)
Correct dose is ONE pill every 12 hrs WITH a MEAL (the tid was computer recommendation which I thought I had over ridden)

## 2010-09-23 NOTE — Telephone Encounter (Signed)
Dr hopper called patient last night he call augmentin in to pharmacy - directions state take 1 tablet 3 times a day - pharmacy stated dose is usually 1 every 12 hours - please verify which is correct -patient pick rx up

## 2010-09-23 NOTE — Telephone Encounter (Signed)
Hop pls advise 

## 2010-09-23 NOTE — Telephone Encounter (Signed)
Left msg on pt voicemail rx corrected and called to pharmacy

## 2010-09-24 ENCOUNTER — Telehealth: Payer: Self-pay | Admitting: *Deleted

## 2010-09-24 NOTE — Telephone Encounter (Signed)
Hopp spoke w/ pt.

## 2010-10-18 ENCOUNTER — Telehealth: Payer: Self-pay | Admitting: *Deleted

## 2010-10-18 NOTE — Telephone Encounter (Signed)
Pt left message noting that she was swimming last week and developed a dry cough. Pt also had some fever, vomiting, and diarrhea. Pt notes that all has resolved except the cough. Pt notes that she is using her neti-pot, mucinex, etc.  I returned call to pt, and left message to call back for OV.

## 2010-11-11 ENCOUNTER — Other Ambulatory Visit: Payer: Self-pay | Admitting: Cardiovascular Disease

## 2010-11-11 ENCOUNTER — Other Ambulatory Visit: Payer: Self-pay | Admitting: Endocrinology

## 2010-11-11 ENCOUNTER — Telehealth: Payer: Self-pay | Admitting: Cardiovascular Disease

## 2010-11-11 DIAGNOSIS — I10 Essential (primary) hypertension: Secondary | ICD-10-CM

## 2010-11-11 MED ORDER — LISINOPRIL 20 MG PO TABS: ORAL_TABLET | ORAL | Status: DC

## 2010-11-11 NOTE — Telephone Encounter (Signed)
Patient called for Lisinopril refill . Patient has not seen Dr. Clifton James since 01/06/2009. Patient was advised  to call Dr. Alwyn Ren PCP for refill, she  had an office visit with him on 09/22/10.  Patient states Dr. Alwyn Ren would not do the refill. A refill was send to [facility] pharmacy for Lisinopril 20 mg two tablets once a day with two refills. Pt. Was advised to make an appointment in Dr. Gibson Ramp clinic soon. Patient verbalized understanding.

## 2010-11-11 NOTE — Telephone Encounter (Signed)
Per pt call pt is out of HCT [name] had written pt RX but pt is not longer seeing him and wanted to know if Dr. Clifton [name] could write her a RX for hydrocortisone. Please return pt call to advise.

## 2010-11-12 ENCOUNTER — Telehealth: Payer: Self-pay | Admitting: Cardiovascular Disease

## 2010-11-12 NOTE — Telephone Encounter (Signed)
We can get fasting labs when patient comes in for appointment with Dr. Clifton James.

## 2010-11-12 NOTE — Telephone Encounter (Signed)
Pt calling re fu appt 9-13, wants to have cholesterol check that am at 9a, just need order

## 2010-12-07 ENCOUNTER — Other Ambulatory Visit (HOSPITAL_COMMUNITY): Payer: Self-pay | Admitting: Obstetrics and Gynecology

## 2010-12-07 DIAGNOSIS — Z1231 Encounter for screening mammogram for malignant neoplasm of breast: Secondary | ICD-10-CM

## 2010-12-08 ENCOUNTER — Other Ambulatory Visit: Payer: Self-pay | Admitting: Endocrinology

## 2010-12-13 ENCOUNTER — Ambulatory Visit (HOSPITAL_COMMUNITY)
Admission: RE | Admit: 2010-12-13 | Discharge: 2010-12-13 | Disposition: A | Discharge status: Home / Self Care | Payer: Medicare Other | Source: Ambulatory Visit | Attending: Obstetrics and Gynecology | Admitting: Obstetrics and Gynecology

## 2010-12-13 DIAGNOSIS — Z1231 Encounter for screening mammogram for malignant neoplasm of breast: Secondary | ICD-10-CM | POA: Insufficient documentation

## 2010-12-16 ENCOUNTER — Encounter: Payer: Self-pay | Admitting: Cardiovascular Disease

## 2010-12-16 ENCOUNTER — Ambulatory Visit (INDEPENDENT_AMBULATORY_CARE_PROVIDER_SITE_OTHER): Payer: Medicare Other | Admitting: Cardiovascular Disease

## 2010-12-16 VITALS — BP 122/84 | HR 76 | Resp 18 | Ht 68.0 in | Wt 247.0 lb

## 2010-12-16 DIAGNOSIS — I10 Essential (primary) hypertension: Secondary | ICD-10-CM

## 2010-12-16 DIAGNOSIS — R002 Palpitations: Secondary | ICD-10-CM

## 2010-12-16 LAB — LIPID PANEL
HDL: 49.4 mg/dL (ref >39.00)
LDL Cholesterol: 87 mg/dL (ref 0–99)
Total CHOL/HDL Ratio: 3
Triglycerides: 99 mg/dL (ref 0.0–149.0)

## 2010-12-16 MED ORDER — HYDROCHLOROTHIAZIDE 12.5 MG PO CAPS: 12.5000 mg | ORAL_CAPSULE | Freq: Every day | ORAL | Status: DC

## 2010-12-16 MED ORDER — LISINOPRIL 20 MG PO TABS: 20.0000 mg | ORAL_TABLET | Freq: Every day | ORAL | Status: DC

## 2010-12-16 NOTE — Assessment & Plan Note (Signed)
Likely premature beats. If continues will have her call back and set up with 21 day event monitor. TSH normal June 2012.

## 2010-12-16 NOTE — Progress Notes (Signed)
History of Present Illness:40 yo WF with history of inflammatory arthritis, HTN, hypothyroidism here for follow up. She has not been seen in our office since October 2010. She had been followed in the past by Dr. Shelva Majestic.  She had a normal echo 01/24/08. She tells me that she is doing well. She has had no chest pain or SOB.   Past Medical History  Diagnosis Date  . Hypothyroidism   . Hypertension   . Anxiety   . Depression   . Polycystic ovarian disease   . Osteoarthritis     Past Surgical History  Procedure Date  . Eye surgery     Retinal tears surgery bilateral  . Appendectomy   . Abdominal hysterectomy   . Tonsillectomy   . Carpal tunnel release     Current Outpatient Prescriptions  Medication Sig Dispense Refill  . aspirin 81 MG tablet Take 81 mg by mouth daily.        . Calcium Carbonate-Vitamin D (CALCIUM + D PO) Take by mouth daily.        . cetirizine (ZYRTEC) 10 MG tablet Take 10 mg by mouth daily.        . Cholecalciferol (VITAMIN D3) 1000 UNITS CAPS Take by mouth 2 (two) times daily.        . cyclobenzaprine (FLEXERIL) 10 MG tablet Take 10 mg by mouth as needed.        [name] estradiol (ESTRACE) 1 MG tablet Take 1 mg by mouth daily.        [name] gabapentin (NEURONTIN) 300 MG capsule Take 300 mg by mouth 2 (two) times daily.       . hydrochlorothiazide (,MICROZIDE/HYDRODIURIL,) 12.5 MG capsule TAKE ONE CAPSULE BY MOUTH EVERY DAY  30 capsule  0  . hydroxychloroquine (PLAQUENIL) 200 MG tablet Take 200 mg by mouth. 2 by mouth in the am       . levothyroxine (SYNTHROID, LEVOTHROID) 112 MCG tablet TAKE ONE TABLET BY MOUTH EVERY DAY  30 tablet  0  . lisinopril (PRINIVIL,ZESTRIL) 20 MG tablet Take 20 mg by mouth daily.        . meloxicam (MOBIC) 7.5 MG tablet Take 7.5 mg by mouth 2 (two) times daily.        . mometasone (ELOCON) 0.1 % cream Apply to affected area bid prn  30 g  0  . Multiple Vitamins-Calcium (ONE-A-DAY WOMENS PO) Take by mouth daily.        . Omega-3 Fatty Acids (FISH  OIL) 1000 MG CAPS Take by mouth daily.        . predniSONE (DELTASONE) 20 MG tablet Take 20 mg by mouth 3 (three) times daily.        . ranitidine (ZANTAC) 150 MG capsule Take 150 mg by mouth daily.          Allergies  Allergen Reactions  . Nalbuphine     Nubain caused respiratory distress & rash  . Oxycodone-Acetaminophen     Tylox, Percocet & Darvocet  caused facial rash but she has taken Vicodin  . Sulfamethoxazole W/Trimethoprim     rash    History   Social History  . Marital Status: Divorced    Spouse Name: N/A    Number of Children: N/A  . Years of Education: N/A   Occupational History  . Not on file.   Social History Main Topics  . Smoking status: Never Smoker   . Smokeless tobacco: Not on file  . Alcohol Use: Yes  Rare  . Drug Use: No  . Sexually Active: Not on file   Other Topics Concern  . Not on file   Social History Narrative  . No narrative on file    Family History  Problem Relation Age of Onset  . Hypertension Mother   . Heart disease Father   . Heart attack Father   . Hypertension Sister   . Heart attack Paternal Grandmother     Review of Systems:  As stated in the HPI and otherwise negative.   BP 122/84  Pulse 76  Resp 18  Ht 5\' 8"  (1.727 m)  Wt 247 lb (112.038 kg)  BMI 37.56 kg/m2  Physical Examination: General: Well developed, well nourished, NAD HEENT: OP clear, mucus membranes moist SKIN: warm, dry. No rashes. Neuro: No focal deficits Musculoskeletal: Muscle strength 5/5 all ext Psychiatric: Mood and affect normal Neck: No JVD, no carotid bruits, no thyromegaly, no lymphadenopathy. Lungs:Clear bilaterally, no wheezes, rhonci, crackles Cardiovascular: Regular rate and rhythm. No murmurs, gallops or rubs. Abdomen:Soft. Bowel sounds present. Non-tender.  Extremities: No lower extremity edema. Pulses are 2 + in the bilateral DP/PT.  EKG:NSR, rate 76 bpm. Poor R wave progression through precordial leads.

## 2010-12-16 NOTE — Assessment & Plan Note (Addendum)
BP well controlled. Will continue current therapy. No changes. Check lipids today.

## 2010-12-16 NOTE — Patient Instructions (Signed)
Your physician recommends that you have lab work today: lipid (401.9)  Your physician recommends that you continue on your current medications as directed. Please refer to the Current Medication list given to you today.  Your physician wants you to follow-up in: 1 year. You will receive a reminder letter in the mail two months in advance. If you don't receive a letter, please call our office to schedule the follow-up appointment.

## 2010-12-27 LAB — BASIC METABOLIC PANEL
BUN: 12
Calcium: 9.2
Creatinine, Ser: 0.6
GFR calc Af Amer: >60
GFR calc non Af Amer: >60

## 2010-12-27 LAB — CBC
Hemoglobin: 12.3
RBC: 4.35
WBC: 12.4 — ABNORMAL HIGH

## 2011-01-11 ENCOUNTER — Other Ambulatory Visit: Payer: Self-pay | Admitting: Endocrinology

## 2011-01-12 ENCOUNTER — Other Ambulatory Visit: Payer: Self-pay | Admitting: Endocrinology

## 2011-01-13 ENCOUNTER — Other Ambulatory Visit: Payer: Self-pay | Admitting: Endocrinology

## 2011-01-24 ENCOUNTER — Ambulatory Visit (INDEPENDENT_AMBULATORY_CARE_PROVIDER_SITE_OTHER): Payer: Medicare Other | Admitting: Endocrinology

## 2011-01-24 ENCOUNTER — Encounter: Payer: Self-pay | Admitting: Endocrinology

## 2011-01-24 DIAGNOSIS — E039 Hypothyroidism, unspecified: Secondary | ICD-10-CM

## 2011-01-24 MED ORDER — LEVOTHYROXINE SODIUM 112 MCG PO TABS: 112.0000 ug | ORAL_TABLET | Freq: Every day | ORAL | Status: DC

## 2011-01-24 NOTE — Progress Notes (Signed)
Subjective:    Patient ID: Jasper Riling, female    DOB: February 03, 1971, 40 y.o.   MRN: [id number]  HPI pt states she was dx'ed with hypothyroidism in 2009.  pt states she feels well in general, except for arthralgias.  She has had tah.   Past Medical History  Diagnosis Date  . Hypothyroidism   . Hypertension   . Anxiety   . Depression   . Polycystic ovarian disease   . Osteoarthritis     Past Surgical History  Procedure Date  . Eye surgery     Retinal tears surgery bilateral  . Appendectomy   . Abdominal hysterectomy   . Tonsillectomy   . Carpal tunnel release     History   Social History  . Marital Status: Divorced    Spouse Name: N/A    Number of Children: N/A  . Years of Education: N/A   Occupational History  . Not on file.   Social History Main Topics  . Smoking status: Never Smoker   . Smokeless tobacco: Not on file  . Alcohol Use: Yes     Rare  . Drug Use: No  . Sexually Active: Not on file   Other Topics Concern  . Not on file   Social History Narrative  . No narrative on file    Current Outpatient Prescriptions on File Prior to Visit  Medication Sig Dispense Refill  . aspirin 81 MG tablet Take 81 mg by mouth daily.        . cetirizine (ZYRTEC) 10 MG tablet Take 10 mg by mouth daily.        . Cholecalciferol (VITAMIN D3) 1000 UNITS CAPS Take by mouth 2 (two) times daily.        [name] estradiol (ESTRACE) 1 MG tablet Take 1 mg by mouth daily.        [name] gabapentin (NEURONTIN) 300 MG capsule Take 300 mg by mouth 2 (two) times daily.       . hydrochlorothiazide (,MICROZIDE/HYDRODIURIL,) 12.5 MG capsule Take 1 capsule (12.5 mg total) by mouth daily.  90 capsule  3  . hydroxychloroquine (PLAQUENIL) 200 MG tablet Take 200 mg by mouth. 2 by mouth in the am       . lisinopril (PRINIVIL,ZESTRIL) 20 MG tablet Take 1 tablet (20 mg total) by mouth daily.  90 tablet  3  . meloxicam (MOBIC) 7.5 MG tablet Take 7.5 mg by mouth 2 (two) times daily.        . ranitidine  (ZANTAC) 150 MG capsule Take 150 mg by mouth daily.          Allergies  Allergen Reactions  . Nalbuphine     Nubain caused respiratory distress & rash  . Oxycodone-Acetaminophen     Tylox, Percocet & Darvocet  caused facial rash but she has taken Vicodin  . Sulfamethoxazole W/Trimethoprim     rash    Family History  Problem Relation Age of Onset  . Hypertension Mother   . Heart disease Father   . Heart attack Father   . Hypertension Sister   . Heart attack Paternal Grandmother     BP 118/82  Pulse 63  Temp(Src) 98.1 F (36.7 C) (Oral)  Ht 5\' 8"  (1.727 m)  Wt 246 lb (111.585 kg)  BMI 37.40 kg/m2  SpO2 96%    Review of Systems She also has numbness of the hands, due to cts.    Objective:   Physical Exam VITAL SIGNS:  See vs page  GENERAL: no distress. NECK: There is no palpable thyroid enlargement.  No thyroid nodule is palpable.  No palpable lymphadenopathy at the anterior neck.   Lab Results  Component Value Date   TSH 0.92 09/22/2010      Assessment & Plan:  Primary hypothyroidism, well-replaced

## 2011-01-24 NOTE — Patient Instructions (Signed)
Please continue the same levothyroxine. Please return in 1 year. Your chance of developing diabetes is approx 10% per year.  good diet and exercise reduces this down to approx 5% per year.

## 2011-02-03 HISTORY — PX: PLANTAR FASCIA SURGERY: SHX746

## 2011-02-17 ENCOUNTER — Ambulatory Visit (INDEPENDENT_AMBULATORY_CARE_PROVIDER_SITE_OTHER): Payer: Medicare Other | Admitting: *Deleted

## 2011-02-17 VITALS — Temp 97.5°F

## 2011-02-17 DIAGNOSIS — Z23 Encounter for immunization: Secondary | ICD-10-CM

## 2011-04-01 ENCOUNTER — Telehealth: Payer: Self-pay | Admitting: *Deleted

## 2011-04-01 NOTE — Telephone Encounter (Signed)
Zicam Melts or Zinc lozenges ; vitamin C 2000 mg daily; & Echinacea for 4-7 days. Report fever, exudate("pus") or progressive pain. Saturday Clinic if no better

## 2011-04-01 NOTE — Telephone Encounter (Signed)
Pt reports that she is scheduled to have Sx next Fri 01.04.

## 2011-04-01 NOTE — Telephone Encounter (Signed)
Pt informed, agreed & understood. Gave information for Sat clinic.

## 2011-04-01 NOTE — Telephone Encounter (Signed)
Pt reports that she is scheduled to have Sx next Fri 01.04 w/Dr Al Corpus and after speaking w/his office on Wednesday, she was told that if she did "sound any better" by next week that she will not be able to have her surgery & to call PCP office on Friday 12.28.12, if not better. Pt reports that she believes that she is doing everything that MD would advise: Coricidan, Mucinex, Neti Pot, [and another OTC med?]. Pt reports that she believes this to be "all in her head/sinuses & just common cold", drainage is now clear, No respiratory Sxs. Pt states she has had flu & pneumonia vaccine and only having H/A & low-grade fever. Pt was told that she cannot have Sx next week if she still "sounds" like she does now and would like to know if you recommend OV or new Rx.?

## 2011-04-04 ENCOUNTER — Ambulatory Visit (INDEPENDENT_AMBULATORY_CARE_PROVIDER_SITE_OTHER): Payer: Medicare Other | Admitting: Family Medicine

## 2011-04-04 ENCOUNTER — Encounter: Payer: Self-pay | Admitting: Family Medicine

## 2011-04-04 VITALS — BP 130/70 | HR 77 | Temp 98.9°F | Ht 68.0 in | Wt 250.2 lb

## 2011-04-04 DIAGNOSIS — J329 Chronic sinusitis, unspecified: Secondary | ICD-10-CM | POA: Insufficient documentation

## 2011-04-04 DIAGNOSIS — R059 Cough, unspecified: Secondary | ICD-10-CM

## 2011-04-04 DIAGNOSIS — J3489 Other specified disorders of nose and nasal sinuses: Secondary | ICD-10-CM

## 2011-04-04 DIAGNOSIS — R05 Cough: Secondary | ICD-10-CM

## 2011-04-04 MED ORDER — BENZONATATE 200 MG PO CAPS: 200.0000 mg | ORAL_CAPSULE | Freq: Three times a day (TID) | ORAL | Status: AC | PRN

## 2011-04-04 MED ORDER — CLARITHROMYCIN ER 500 MG PO TB24: 1000.0000 mg | ORAL_TABLET | Freq: Every day | ORAL | Status: AC

## 2011-04-04 NOTE — Assessment & Plan Note (Signed)
Pt's sxs and PE consistent w/ infxn.  Start abx.  Reviewed supportive care and red flags that should prompt return.  Pt expressed understanding and is in agreement w/ plan.  

## 2011-04-04 NOTE — Progress Notes (Signed)
  Subjective:    Patient ID: [name], female    DOB: January 23, 1971, 40 y.o.   MRN: [id number]  HPI Cough- sxs started 2 weeks ago w/ 'sinus infxn'.  Had green nasal drainage.  Started Coricidin w/ some improvement.  On 12/25 developed laryngitis, fever.  + sore throat, PND, ear fullness.  + facial pain.  Cough is productive.  + sick contacts.   Review of Systems For ROS see HPI     Objective:   Physical Exam  Vitals reviewed. Constitutional: [name]. No distress.  HENT:  Head: Normocephalic and atraumatic.  Right Ear: Tympanic membrane normal.  Left Ear: Tympanic membrane normal.  Nose: Mucosal edema and rhinorrhea present. Right sinus exhibits maxillary sinus tenderness. Right sinus exhibits no frontal sinus tenderness. Left sinus exhibits maxillary sinus tenderness. Left sinus exhibits no frontal sinus tenderness.  Mouth/Throat: Uvula is midline and mucous membranes are normal. Posterior oropharyngeal erythema present. No oropharyngeal exudate.  Eyes: Conjunctivae and EOM are normal. Pupils are equal, round, and reactive to light.  Neck: Normal range of motion. Neck supple.  Cardiovascular: Normal rate, regular rhythm and normal heart sounds.   Pulmonary/Chest: Effort normal and breath sounds normal. No respiratory distress. She has no wheezes.       + hacking cough  Lymphadenopathy:    She has no cervical adenopathy.          Assessment & Plan:

## 2011-04-04 NOTE — Patient Instructions (Signed)
This is a sinus infection Take the Biaxin- 2 tabs at the same time w/ food Drink plenty of fluids Add Mucinex to thin your congestion Use the cough meds as needed REST! Call with any questions or concerns Happy New Year!!!

## 2011-05-10 ENCOUNTER — Telehealth: Payer: Self-pay | Admitting: Gastroenterology

## 2011-05-10 NOTE — Telephone Encounter (Signed)
Pt states that she has been having a lot of acid reflux, she has been taking Zantac 150mg  OTC but it does not see to be helping. Pts mother is Darel Hong Stump.

## 2011-05-10 NOTE — Telephone Encounter (Signed)
ok 

## 2011-05-10 NOTE — Telephone Encounter (Signed)
I cannot determine her GI problem from notes in EPIC. Reviewing in EPIC she saw Dr. Ewing Schlein prior to Dr. Arlyce Dice. Please clarify the reason for a GI appt. What it her mother's name?

## 2011-05-10 NOTE — Telephone Encounter (Signed)
Spoke with pt., she last saw Dr. Arlyce Dice in 2009. Pt is requesting to switch to Dr. Russella Dar. Pt states that her mother sees Dr. Russella Dar and she would like to switch. Dr. Arlyce Dice please advise.

## 2011-05-10 NOTE — Telephone Encounter (Signed)
Dr. Russella Dar please see the note below. Pt is requesting to switch from Dr. Arlyce Dice to Dr. Russella Dar. Please advise if you will see the pt.

## 2011-05-11 NOTE — Telephone Encounter (Signed)
Pt aware and pt scheduled for appt with Dr. Russella Dar for 06/06/11@9 :30am. Pt aware of appt date and time.

## 2011-05-11 NOTE — Telephone Encounter (Signed)
OK to change to me. Can try Prilosec OTC qam instead of Zantac until office visit.

## 2011-06-06 ENCOUNTER — Other Ambulatory Visit (INDEPENDENT_AMBULATORY_CARE_PROVIDER_SITE_OTHER): Payer: Medicare Other

## 2011-06-06 ENCOUNTER — Ambulatory Visit (INDEPENDENT_AMBULATORY_CARE_PROVIDER_SITE_OTHER): Payer: Medicare Other | Admitting: Gastroenterology

## 2011-06-06 ENCOUNTER — Encounter: Payer: Self-pay | Admitting: Gastroenterology

## 2011-06-06 VITALS — BP 112/80 | HR 76 | Ht 68.0 in | Wt 249.5 lb

## 2011-06-06 DIAGNOSIS — K219 Gastro-esophageal reflux disease without esophagitis: Secondary | ICD-10-CM

## 2011-06-06 DIAGNOSIS — K589 Irritable bowel syndrome without diarrhea: Secondary | ICD-10-CM

## 2011-06-06 DIAGNOSIS — R197 Diarrhea, unspecified: Secondary | ICD-10-CM

## 2011-06-06 LAB — CBC WITH DIFFERENTIAL/PLATELET
Basophils Relative: 0.4 % (ref 0.0–3.0)
Eosinophils Relative: 1.9 % (ref 0.0–5.0)
HCT: 38 % (ref 36.0–46.0)
Hemoglobin: 12.8 g/dL (ref 12.0–15.0)
Lymphs Abs: 1.9 10*3/uL (ref 0.7–4.0)
MCV: 85.7 fl (ref 78.0–100.0)
Monocytes Absolute: 0.7 10*3/uL (ref 0.1–1.0)
Neutro Abs: 4.8 10*3/uL (ref 1.4–7.7)
RBC: 4.43 Mil/uL (ref 3.87–5.11)
WBC: 7.5 10*3/uL (ref 4.5–10.5)

## 2011-06-06 LAB — COMPREHENSIVE METABOLIC PANEL
Albumin: 3.3 g/dL — ABNORMAL LOW (ref 3.5–5.2)
Alkaline Phosphatase: 62 U/L (ref 39–117)
BUN: 14 mg/dL (ref 6–23)
Creatinine, Ser: 0.6 mg/dL (ref 0.4–1.2)
Glucose, Bld: 70 mg/dL (ref 70–99)
Potassium: 3.9 mEq/L (ref 3.5–5.1)
Total Bilirubin: 0.2 mg/dL — ABNORMAL LOW (ref 0.3–1.2)

## 2011-06-06 MED ORDER — OMEPRAZOLE 40 MG PO CPDR: 40.0000 mg | DELAYED_RELEASE_CAPSULE | Freq: Every day | ORAL | Status: DC

## 2011-06-06 MED ORDER — ALIGN 4 MG PO CAPS: 1.0000 | ORAL_CAPSULE | Freq: Every day | ORAL | Status: DC

## 2011-06-06 MED ORDER — GLYCOPYRROLATE 1 MG PO TABS: 1.0000 mg | ORAL_TABLET | Freq: Two times a day (BID) | ORAL | Status: DC

## 2011-06-06 NOTE — Patient Instructions (Addendum)
Your physician has requested that you go to the basement for the following lab work before leaving today: CBC, CMP, Celiac Panel. Start Align samples one tablet by mouth once daily x 2 weeks.  Increase taking your ranitidine twice daily until finished.  We have sent the following medications to your pharmacy for you to pick up at your convenience: Prilosec 40 mg tablets to take once daily long-term and Robinul 1 mg tablets to take twice daily.  Patient advised to avoid spicy, acidic, citrus, chocolate, mints, fruit and fruit juices.  Limit the intake of caffeine, alcohol and Soda.  Don't exercise too soon after eating.  Don't lie down within 3-4 hours of eating.  Elevate the head of your bed. Low gas diet given to patient.  cc: Marga Melnick, MD

## 2011-06-06 NOTE — Progress Notes (Signed)
History of Present Illness: This is a 41 year old female who was recently changed from Dr. Arlyce Dice to me. She has chronic GERD which was previously well controlled on Prevacid and Nexium. She underwent upper endoscopy in 2009 which was normal. Most recently she's been taking ranitidine 150 mg daily and notes frequent heartburn and belching. She carries a diagnosis of irritable bowel syndrome states as a teenager she had difficulties with constipation recurrent abdominal pain. She had an extensive evaluation and was eventually referred to Dr. Lita Mains in [place]. Over the past several years she has had postprandial urgent diarrhea, abdominal bloating, pain and gas. These symptoms have worsened over the past few months. Denies weight loss, constipation, change in stool caliber, melena, hematochezia, nausea, vomiting, dysphagia,  chest pain.  Review of Systems: Pertinent positive and negative review of systems were noted in the above HPI section. All other review of systems were otherwise negative.  Current Medications, Allergies, Past Medical History, Past Surgical History, Family History and Social History were reviewed in [employer] record.  Physical Exam: General: Well developed , well nourished, obese, no acute distress Head: Normocephalic and atraumatic Eyes:  sclerae anicteric, EOMI Ears: Normal auditory acuity Mouth: No deformity or lesions Neck: Supple, no masses or thyromegaly Lungs: Clear throughout to auscultation Heart: Regular rate and rhythm; no murmurs, rubs or bruits Abdomen: Soft, non tender and non distended. No masses, hepatosplenomegaly or hernias noted. Normal Bowel sounds Musculoskeletal: Symmetrical with no gross deformities  Skin: No lesions on visible extremities Pulses:  Normal pulses noted Extremities: No clubbing, cyanosis, edema or deformities noted Neurological: Alert oriented x 4, grossly nonfocal Cervical Nodes:  No significant  cervical adenopathy Inguinal Nodes: No significant inguinal adenopathy Psychological:  Alert and cooperative. Normal mood and affect  Assessment and Recommendations:  1. GERD. Begin standard antireflux measures. Increase ranitidine to 150 mg twice daily and when her current bottle of ranitidine is completed begin omeprazole 40 mg daily.  2. Irritable bowel syndrome. This is the presumed cause of her postprandial diarrhea, abdominal bloating, gas and abdominal pain. CMET, CBC and celiac panel. She states she had a TSH performed within the past few months that was normal. Begin glycopyrrolate 1 mg twice daily. Multimedia programmer. If her symptoms are not adequately controlled consider further evaluation with stool Hemoccults and colonoscopy.

## 2011-06-07 LAB — CELIAC PANEL 10
Endomysial Screen: NEGATIVE
Gliadin IgA: 12.5 U/mL (ref <20)
IgA: 432 mg/dL — ABNORMAL HIGH (ref 69–380)

## 2011-06-13 ENCOUNTER — Ambulatory Visit (AMBULATORY_SURGERY_CENTER): Payer: Medicare Other | Admitting: *Deleted

## 2011-06-13 VITALS — Ht 68.0 in | Wt 246.0 lb

## 2011-06-13 DIAGNOSIS — Z1211 Encounter for screening for malignant neoplasm of colon: Secondary | ICD-10-CM

## 2011-06-13 MED ORDER — PEG-KCL-NACL-NASULF-NA ASC-C 100 G PO SOLR: ORAL | Status: DC

## 2011-06-14 ENCOUNTER — Encounter: Payer: Self-pay | Admitting: Gastroenterology

## 2011-06-20 ENCOUNTER — Telehealth: Payer: Self-pay | Admitting: *Deleted

## 2011-06-20 NOTE — Telephone Encounter (Signed)
Moved patient to an earlier available appointment time.  Instructed patient to arrive at 0730 for an 0830 appointment.

## 2011-06-21 ENCOUNTER — Encounter: Payer: Self-pay | Admitting: Gastroenterology

## 2011-06-21 ENCOUNTER — Encounter: Payer: Medicare Other | Admitting: Gastroenterology

## 2011-06-21 ENCOUNTER — Ambulatory Visit (AMBULATORY_SURGERY_CENTER): Payer: Medicare Other | Admitting: Gastroenterology

## 2011-06-21 VITALS — BP 131/78 | HR 73 | Temp 98.2°F | Resp 15 | Ht 68.0 in | Wt 246.0 lb

## 2011-06-21 DIAGNOSIS — K219 Gastro-esophageal reflux disease without esophagitis: Secondary | ICD-10-CM

## 2011-06-21 DIAGNOSIS — D1330 Benign neoplasm of unspecified part of small intestine: Secondary | ICD-10-CM

## 2011-06-21 DIAGNOSIS — Z1211 Encounter for screening for malignant neoplasm of colon: Secondary | ICD-10-CM

## 2011-06-21 DIAGNOSIS — R197 Diarrhea, unspecified: Secondary | ICD-10-CM

## 2011-06-21 MED ORDER — SODIUM CHLORIDE 0.9 % IV SOLN: 500.0000 mL | INTRAVENOUS | Status: DC

## 2011-06-21 NOTE — Progress Notes (Signed)
Patient did not experience any of the following events: a burn prior to discharge; a fall within the facility; wrong site/side/patient/procedure/implant event; or a hospital transfer or hospital admission upon discharge from the facility. (G8907) Patient did not have preoperative order for IV antibiotic SSI prophylaxis. (G8918)  

## 2011-06-21 NOTE — Patient Instructions (Signed)
YOU HAD AN ENDOSCOPIC PROCEDURE TODAY AT THE [facility]: Refer to the procedure report that was given to you for any specific questions about what was found during the examination.  If the procedure report does not answer your questions, please call your gastroenterologist to clarify.  If you requested that your care partner not be given the details of your procedure findings, then the procedure report has been included in a sealed envelope for you to review at your convenience later.  YOU SHOULD EXPECT: Some feelings of bloating in the abdomen. Passage of more gas than usual.  Walking can help get rid of the air that was put into your GI tract during the procedure and reduce the bloating. If you had a lower endoscopy (such as a colonoscopy or flexible sigmoidoscopy) you may notice spotting of blood in your stool or on the toilet paper. If you underwent a bowel prep for your procedure, then you may not have a normal bowel movement for a few days.  DIET: Your first meal following the procedure should be a light meal and then it is ok to progress to your normal diet.  A half-sandwich or bowl of soup is an example of a good first meal.  Heavy or fried foods are harder to digest and may make you feel nauseous or bloated.  Likewise meals heavy in dairy and vegetables can cause extra gas to form and this can also increase the bloating.  Drink plenty of fluids but you should avoid alcoholic beverages for 24 hours.  ACTIVITY: Your care partner should take you home directly after the procedure.  You should plan to take it easy, moving slowly for the rest of the day.  You can resume normal activity the day after the procedure however you should NOT DRIVE or use heavy machinery for 24 hours (because of the sedation medicines used during the test).    SYMPTOMS TO REPORT IMMEDIATELY: A gastroenterologist can be reached at any hour.  During normal business hours, 8:30 AM to 5:00 PM Monday through Friday,  call [phone].  After hours and on weekends, please call the GI answering service at [phone] who will take a message and have the physician on call contact you.   Following lower endoscopy (colonoscopy or flexible sigmoidoscopy):  Excessive amounts of blood in the stool  Significant tenderness or worsening of abdominal pains  Swelling of the abdomen that is new, acute  Fever of 100F or higher  Following upper endoscopy (EGD)  Vomiting of blood or coffee ground material  New chest pain or pain under the shoulder blades  Painful or persistently difficult swallowing  New shortness of breath  Fever of 100F or higher  Black, tarry-looking stools  FOLLOW UP: If any biopsies were taken you will be contacted by phone or by letter within the next 1-3 weeks.  Call your gastroenterologist if you have not heard about the biopsies in 3 weeks.  Our staff will call the home number listed on your records the next business day following your procedure to check on you and address any questions or concerns that you may have at that time regarding the information given to you following your procedure. This is a courtesy call and so if there is no answer at the home number and we have not heard from you through the emergency physician on call, we will assume that you have returned to your regular daily activities without incident.  SIGNATURES/CONFIDENTIALITY: You and/or your care   partner have signed paperwork which will be entered into your electronic medical record.  These signatures attest to the fact that that the information above on your After Visit Summary has been reviewed and is understood.  Full responsibility of the confidentiality of this discharge information lies with you and/or your care-partner.   Please follow all discharge instructions given to you by the recovery room nurse. If you have any questions or problems after discharge please call one of the numbers listed above. You will  receive a phone call in the am to see how you are doing and answer any questions you may have. Thank you for choosing [facility] for your health care needs.  

## 2011-06-21 NOTE — Op Note (Signed)
[facility] [address] [place], [place]  [address]  ENDOSCOPY PROCEDURE REPORT  PATIENT:  [name], [name]  MR#:  [id number] BIRTHDATE:  1970/08/19, 40 yrs. old  GENDER:  female ENDOSCOPIST:  Judie Petit T. Russella Dar, MD, [facility]  PROCEDURE DATE:  06/21/2011 PROCEDURE:  EGD with biopsy, 62130 ASA CLASS:  Class II INDICATIONS:  diarrhea, GERD MEDICATIONS:  MAC sedation, administered by CRNA, propofol (Diprivan) 350 mg IV TOPICAL ANESTHETIC:  none DESCRIPTION OF PROCEDURE:   After the risks benefits and alternatives of the procedure were thoroughly explained, informed consent was obtained.  The LB GIF-H180 [id number] endoscope was introduced through the mouth and advanced to the second portion of the duodenum, without limitations.  The instrument was slowly withdrawn as the mucosa was fully examined. <<PROCEDUREIMAGES>> The esophagus and gastroesophageal junction were completely normal in appearance.  The stomach was entered and closely examined. The pylorus, antrum, angularis, and lesser curvature were well visualized, including a retroflexed view of the cardia and fundus. The stomach wall was normally distensable. The scope passed easily through the pylorus into the duodenum.  The duodenal bulb was normal in appearance, as was the postbulbar duodenum. Random biopsies were obtained and sent to pathology.  Retroflexed views revealed no abnormalities.  The scope was then withdrawn from the patient and the procedure completed.  COMPLICATIONS:  None  ENDOSCOPIC IMPRESSION: 1) Normal EGD  RECOMMENDATIONS: 1) Anti-reflux regimen 2) Await pathology results 3) PPI qam  [name], MD, [facility]  n. eSIGNED:   Venita Lick. Anaka Beazer at 06/21/2011 08:53 AM  Stump, Cala Bradford, [phone]

## 2011-06-21 NOTE — Progress Notes (Signed)
PROPOFOL PER S CAMP CRNA. SEE SCANNED INTRA PROCEDURE REPORT. EWM 

## 2011-06-22 ENCOUNTER — Telehealth: Payer: Self-pay

## 2011-06-22 NOTE — Telephone Encounter (Signed)
  Follow up Call-  Call back number 06/21/2011  Post procedure Call Back phone  # [phone]  Permission to leave phone message Yes     Patient questions:  Do you have a fever, pain , or abdominal swelling? no Pain Score  0 *  Have you tolerated food without any problems? yes  Have you been able to return to your normal activities? yes  Do you have any questions about your discharge instructions: Diet   no Medications  no Follow up visit  no  Do you have questions or concerns about your Care? no  Actions: * If pain score is 4 or above: No action needed, pain <4.  No problems per the pt. Maw

## 2011-06-27 ENCOUNTER — Encounter: Payer: Self-pay | Admitting: Gastroenterology

## 2011-07-08 ENCOUNTER — Encounter: Payer: Self-pay | Admitting: Gastroenterology

## 2011-07-08 ENCOUNTER — Ambulatory Visit (INDEPENDENT_AMBULATORY_CARE_PROVIDER_SITE_OTHER): Payer: Medicare Other | Admitting: Gastroenterology

## 2011-07-08 VITALS — BP 130/82 | HR 96 | Ht 68.0 in | Wt 246.1 lb

## 2011-07-08 DIAGNOSIS — K589 Irritable bowel syndrome without diarrhea: Secondary | ICD-10-CM

## 2011-07-08 DIAGNOSIS — K219 Gastro-esophageal reflux disease without esophagitis: Secondary | ICD-10-CM

## 2011-07-08 DIAGNOSIS — R197 Diarrhea, unspecified: Secondary | ICD-10-CM

## 2011-07-08 MED ORDER — GLYCOPYRROLATE 2 MG PO TABS: 2.0000 mg | ORAL_TABLET | Freq: Two times a day (BID) | ORAL | Status: DC

## 2011-07-08 NOTE — Progress Notes (Signed)
History of Present Illness: This is a 41 year old female returning for followup of presumed irritable bowel syndrome with postprandial bloating and urgent, watery, nonbloody diarrhea. Her symptoms have improved on glycopyrrolate and Align. Her reflux symptoms are under much better control on daily omeprazole. Recent upper endoscopy with duodenal biopsies was normal with no evidence of celiac disease.  Current Medications, Allergies, Past Medical History, Past Surgical History, Family History and Social History were reviewed in [employer] record.  Physical Exam: General: Well developed , well nourished, no acute distress Head: Normocephalic and atraumatic Eyes:  sclerae anicteric, EOMI Ears: Normal auditory acuity Mouth: No deformity or lesions Lungs: Clear throughout to auscultation Heart: Regular rate and rhythm; no murmurs, rubs or bruits Abdomen: Soft, non tender and non distended. No masses, hepatosplenomegaly or hernias noted. Normal Bowel sounds Musculoskeletal: Symmetrical with no gross deformities  Extremities: No clubbing, cyanosis, edema or deformities noted Neurological: Alert oriented x 4, grossly nonfocal Psychological:  Alert and cooperative. Anxious  Assessment and Recommendations:  1. Irritable bowel syndrome, diarrhea predominant. I recommended to proceed with colonoscopy to evaluate for other causes of her symptoms including colitis. She declines at this time but states she will do so in the future if her symptoms do not improve. Obtain stool Hemoccults. Increase glycopyrrolate 2 mg twice a day.  Continue Align.  2. GERD. Continue antireflux measures and omeprazole 40 mg daily

## 2011-07-08 NOTE — Patient Instructions (Addendum)
We have sent the following medications to your pharmacy for you to pick up at your convenience:Increase of your Robinul forte 2 mg one tablet by mouth twice daily.  Stay on Align once daily.  Follow instructions on Hemoccult cards and mail them back to [place] when finished.  Call back if your symptoms return or do not resolve.

## 2011-08-10 ENCOUNTER — Telehealth: Payer: Self-pay | Admitting: Internal Medicine

## 2011-08-10 NOTE — Telephone Encounter (Signed)
Caller: [name]/Patient; PCP: Marga Melnick; CB#: [phone]; Call regarding Headache; Onset 08/08/11, rated at 5 to 9.  "Not like a migraine". Pain is on right lateral lower scalp area.  "TMJ actiing up."   Pain not relieved by APAP 1000.  S/p Hysterectomy 2000.  Emergent sx ruled out.  Home care and parameters for callback given for the interim.  See provider in 24 hours per Headache protocol.  Caller declined offer of appt at 1600 on 08/10/11; transferred to [facility] at office for assistance with appointment.

## 2011-08-11 ENCOUNTER — Encounter: Payer: Self-pay | Admitting: Internal Medicine

## 2011-08-11 ENCOUNTER — Ambulatory Visit (INDEPENDENT_AMBULATORY_CARE_PROVIDER_SITE_OTHER): Payer: Medicare Other | Admitting: Internal Medicine

## 2011-08-11 VITALS — BP 138/80 | HR 73 | Temp 99.0°F | Wt 243.0 lb

## 2011-08-11 DIAGNOSIS — M542 Cervicalgia: Secondary | ICD-10-CM

## 2011-08-11 DIAGNOSIS — R519 Headache, unspecified: Secondary | ICD-10-CM

## 2011-08-11 DIAGNOSIS — R51 Headache: Secondary | ICD-10-CM

## 2011-08-11 DIAGNOSIS — J019 Acute sinusitis, unspecified: Secondary | ICD-10-CM

## 2011-08-11 DIAGNOSIS — J309 Allergic rhinitis, unspecified: Secondary | ICD-10-CM

## 2011-08-11 MED ORDER — FLUTICASONE PROPIONATE 50 MCG/ACT NA SUSP: 1.0000 | Freq: Two times a day (BID) | NASAL | Status: DC | PRN

## 2011-08-11 MED ORDER — CEFUROXIME AXETIL 500 MG PO TABS: 500.0000 mg | ORAL_TABLET | Freq: Two times a day (BID) | ORAL | Status: AC

## 2011-08-11 MED ORDER — CYCLOBENZAPRINE HCL 5 MG PO TABS: ORAL_TABLET | ORAL | Status: DC

## 2011-08-11 NOTE — Patient Instructions (Addendum)
Use warm moist compresses to 3 times a day to the affected area.  The gabapentin every 8 hours should help the headache , if you can tolerate this dose. Plain Mucinex for thick secretions ;force NON dairy fluids . Use a Neti pot daily as needed for sinus congestion; going from open side to congested side . Nasal cleansing in the shower as discussed. Make sure that all residual soap is removed to prevent irritation. Fluticasone 1 spray in each nostril twice a day as needed. Use the "crossover" technique as discussed. Plain Allegra 160 daily as needed for itchy eyes & sneezing.

## 2011-08-11 NOTE — Progress Notes (Signed)
  Subjective:    Patient ID: [name], female    DOB: [birth year], 41 y.o.   MRN: [id number]  HPI Her headaches began 08/08/11 without specific trigger; although she questions some allergic component. They're located in the right occipital area and did not radiate. There described as heavy or throbbing. The been present  since 5/6 , but it can vary in intensity. Tylenol arthritis has not been of benefit; ice has helped. There no definite exacerbating factors.  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 She previously took allergy shots until 8 years ago; these were stopped for logistical reasons.  She is being followed by rheumatologist for her lupus    Review of Systems Nausea/vomiting: no  Photophobia/phonophobia: no  Tearing of eyes: OD only Sinus pain/pressure: on either side of nose  Family  Or PMH  migraine:no Personal stressors: no Fever: no Neck pain/stiffness: on R  Vision/speech/swallow/hearing difficulty: no Focal weakness/numbness: no  Altered mental status:no Trauma: no New type of headache: yes  She has menopausal sweats. She has noted some green from the right nares. She is not having fever, frontal headache or maxillary area pain.  She does have itchy eyes and sneezing.  Couple weeks ago a large dog jumped on her and bruised her right breast. There's been a residual knot in this area.  Her neurosurgeon prescribes gabapentin 3 mg for her back. She is only able to take it twice a day as it makes her sleepy.       Objective:   Physical Exam   Gen. appearance: Well-nourished, in no distress.  There is  tenderness to palpation of the right mastoid area. Eyes: Extraocular motion intact, field of vision normal, vision grossly intact, no nystagmus ENT: Canals clear, tympanic membranes normal, tuning fork exam normal, hearing grossly normal Neck: Normal range of motion, no masses, normal thyroid. She has some discomfort with lateral neck rotation.  There is tenderness to palpation over the right posterior neck Cardiovascular: Rate and rhythm normal; no murmurs, gallops or extra heart sounds Muscle skeletal: Range of motion, tone, &  strength normal Neuro:no cranial nerve deficit, deep tendon  reflexes normal, gait normal. Romberg and finger to nose testing are normal Lymph: No cervical or axillary LA Skin: Warm and dry without suspicious lesions or rashes. Small subcutaneous hematoma right upper breast. Psych: no anxiety or mood change. Normally interactive and cooperative.         Assessment & Plan:  #1 right neck and mastoid area pain; no neurologic deficit present  #2 sphenoid area discomfort with purulent drainage from the right nose  #3 chronic extrinsic/allergic symptoms or  #4 posttraumatic hematoma  Plan see orders and recommendations

## 2011-08-14 NOTE — Telephone Encounter (Signed)
Was appointment made? 

## 2011-08-15 NOTE — Telephone Encounter (Signed)
Patient was seen on 08/11/11

## 2011-08-18 ENCOUNTER — Telehealth: Payer: Self-pay | Admitting: Internal Medicine

## 2011-08-18 NOTE — Telephone Encounter (Signed)
Will see pt for pap/breast exams only- CPE's should continue w/ [facility]

## 2011-08-18 NOTE — Telephone Encounter (Signed)
Patient called and states her insurance no longer covers her GYN dr., she see's Dr.Hopper but would like to see you for PAP only as she has had a hysterectomy and is on estrogen. Please advise if you will and I will call patient back to set up appt. Thanks

## 2011-08-19 NOTE — Telephone Encounter (Signed)
Scheduled 5.31.13 10:30am, patient is extremely grateful

## 2011-09-02 ENCOUNTER — Ambulatory Visit (INDEPENDENT_AMBULATORY_CARE_PROVIDER_SITE_OTHER): Payer: Medicare Other | Admitting: Family Medicine

## 2011-09-02 ENCOUNTER — Encounter: Payer: Self-pay | Admitting: Family Medicine

## 2011-09-02 VITALS — BP 132/75 | HR 94 | Temp 98.8°F | Ht 67.0 in | Wt 244.6 lb

## 2011-09-02 DIAGNOSIS — Z87898 Personal history of other specified conditions: Secondary | ICD-10-CM

## 2011-09-02 DIAGNOSIS — Z8742 Personal history of other diseases of the female genital tract: Secondary | ICD-10-CM

## 2011-09-02 DIAGNOSIS — E28319 Asymptomatic premature menopause: Secondary | ICD-10-CM

## 2011-09-02 DIAGNOSIS — Z1231 Encounter for screening mammogram for malignant neoplasm of breast: Secondary | ICD-10-CM

## 2011-09-02 MED ORDER — ESTRADIOL 1 MG PO TABS: 1.5000 mg | ORAL_TABLET | Freq: Every day | ORAL | Status: DC

## 2011-09-02 MED ORDER — NYSTATIN 100000 UNIT/GM EX POWD: Freq: Four times a day (QID) | CUTANEOUS | Status: DC

## 2011-09-02 NOTE — Patient Instructions (Signed)
Follow up in 1 year or as needed I'll ask GYN about what we need to do about your screening schedule Increase to 1.5 tabs daily Call with any questions or concerns Hang in there!!!

## 2011-09-02 NOTE — Progress Notes (Signed)
  Subjective:    Patient ID: [name], female    DOB: Aug 10, 1970, 41 y.o.   MRN: [id number]  HPI GYN- s/p TAH-BSO in 2000 due to endometriosis and pre-cancerous cells.  Previously seeing Dr Jones Skene, then Dr Henderson Cloud and then Dr Senaida Ores.  Has had 13 yrs of normal paps.  Would like to stop having them.  UTD on mammo.  On Estrogen replacement but still having severe hot flashes- wants to increase if possible.   Review of Systems For ROS see HPI     Objective:   Physical Exam  Vitals reviewed. Constitutional: [name].       Obese [name] herself and flushed  Pulmonary/Chest: Right breast exhibits no inverted nipple, no mass, no nipple discharge, no skin change and no tenderness. Left breast exhibits no inverted nipple, no mass, no nipple discharge, no skin change and no tenderness.  Genitourinary:       Deferred at pt request          Assessment & Plan:

## 2011-09-04 DIAGNOSIS — Z87898 Personal history of other specified conditions: Secondary | ICD-10-CM | POA: Insufficient documentation

## 2011-09-04 NOTE — Assessment & Plan Note (Signed)
New.  Pt's current estrogen tx is not effective in managing her sxs.  Will increase estrogen to 1.5 tabs daily.  Stressed importance of yearly mammo- order placed.  Will follow.

## 2011-09-04 NOTE — Assessment & Plan Note (Signed)
New.  Pt reports she was told this at time of hysterectomy.  Has since had 13 yrs of normal paps.  Would like to stop having vaginal exams if possible.  Will discuss case w/ GYN and get their recommendations.  Will follow.

## 2011-09-23 ENCOUNTER — Ambulatory Visit (INDEPENDENT_AMBULATORY_CARE_PROVIDER_SITE_OTHER): Payer: Medicare Other | Admitting: Cardiovascular Disease

## 2011-09-23 ENCOUNTER — Encounter: Payer: Self-pay | Admitting: Cardiovascular Disease

## 2011-09-23 VITALS — BP 135/88 | HR 74 | Ht 68.0 in | Wt 242.0 lb

## 2011-09-23 DIAGNOSIS — R079 Chest pain, unspecified: Secondary | ICD-10-CM | POA: Insufficient documentation

## 2011-09-23 NOTE — Assessment & Plan Note (Signed)
There are typical and atypical features of her chest pain. She has a personal history of HTN and obesity and a strong family history of CAD. Will arrange exercise stress echo to exclude ischemia as the cause.

## 2011-09-23 NOTE — Progress Notes (Signed)
History of Present Illness: 41 yo WF with history of lupus,  inflammatory arthritis, HTN, hypothyroidism here for follow up. She has not been seen in our office since October 2010. She had been followed in the past by Dr. Shelva Majestic. She had a normal echo 01/24/08. She has a strong family history of CAD.  She tells me that she has had right sided chest pain for one week. She does describe fatigue, dizziness, nausea and numbness in her left arm. Her BP is well controlled at home. She also has been having panic attacks and at times feels that her heart racing. Her chest pain seems to be worse.   Primary Care Physician: Marga Melnick  Last Lipid Profile:  Lipid Panel     Component Value Date/Time   CHOL 156 12/16/2010 0945   TRIG 99.0 12/16/2010 0945   HDL 49.40 12/16/2010 0945   CHOLHDL 3 12/16/2010 0945   VLDL 19.8 12/16/2010 0945   LDLCALC 87 12/16/2010 0945     Past Medical History  Diagnosis Date  . Hypothyroidism   . Hypertension   . Anxiety   . Depression     during divorce & legal matters  . Polycystic ovarian disease   . Osteoarthritis   . GERD (gastroesophageal reflux disease)   . IBS (irritable bowel syndrome)   . Kidney stones   . Lupus     Past Surgical History  Procedure Date  . Eye surgery     Retinal tears surgery bilateral  . Appendectomy     exploratory lap  . Abdominal hysterectomy   . Tonsillectomy   . Carpal tunnel release     bilateral  . Cholecystectomy   . Plantar fascia surgery     right  . Back surgery 2003    disc  . Plantar fascia surgery 02/2011    left    Current Outpatient Prescriptions  Medication Sig Dispense Refill  . aspirin 81 MG tablet Take 81 mg by mouth daily.        . cetirizine (ZYRTEC) 10 MG tablet Take 10 mg by mouth daily.        . Cholecalciferol (VITAMIN D3) 1000 UNITS CAPS Take by mouth 2 (two) times daily.        . cyclobenzaprine (FLEXERIL) 5 MG tablet 1-2 qhs prn neck pain  20 tablet  0  . estradiol (ESTRACE) 1 MG  tablet Take 1.5 tablets (1.5 mg total) by mouth daily.  45 tablet  11  . Fexofenadine HCl (ALLEGRA PO) Take by mouth daily.      . fluticasone (FLONASE) 50 MCG/ACT nasal spray Place 1 spray into the nose 2 (two) times daily as needed for rhinitis.  16 g  2  . gabapentin (NEURONTIN) 300 MG capsule Take 300 mg by mouth 2 (two) times daily.       [name] glycopyrrolate (ROBINUL-FORTE) 2 MG tablet Take 1 tablet (2 mg total) by mouth 2 (two) times daily before lunch and supper.  60 tablet  11  . hydrochlorothiazide (,MICROZIDE/HYDRODIURIL,) 12.5 MG capsule Take 1 capsule (12.5 mg total) by mouth daily.  90 capsule  3  . hydroxychloroquine (PLAQUENIL) 200 MG tablet Take 200 mg by mouth. 2 by mouth in the am       . levothyroxine (SYNTHROID, LEVOTHROID) 112 MCG tablet Take 1 tablet (112 mcg total) by mouth daily.  90 tablet  3  . lisinopril (PRINIVIL,ZESTRIL) 20 MG tablet Take 1 tablet (20 mg total) by mouth daily.  90  tablet  3  . meloxicam (MOBIC) 7.5 MG tablet Take 7.5 mg by mouth 2 (two) times daily.        [name] nystatin (MYCOSTATIN) powder Apply topically 4 (four) times daily.  60 g  6  . omeprazole (PRILOSEC) 40 MG capsule Take 1 capsule (40 mg total) by mouth daily.  30 capsule  11  . peg 3350 powder (MOVIPREP) 100 G SOLR MOVI PREP take as directed  1 kit  0  . Probiotic Product (ALIGN) 4 MG CAPS Take 1 capsule by mouth daily.  30 capsule  0  . ranitidine (ZANTAC) 150 MG capsule Take 150 mg by mouth daily.          Allergies  Allergen Reactions  . Nalbuphine     Nubain caused respiratory distress & rash  . Oxycodone-Acetaminophen Diarrhea    Tylox, Percocet & Darvocet  caused facial rash but she has taken Vicodin  . Sulfamethoxazole W-Trimethoprim     rash  . Ivp Dye (Iodinated Diagnostic Agents) Other (See Comments)    Hot in face and red    History   Social History  . Marital Status: Divorced    Spouse Name: N/A    Number of Children: 0  . Years of Education: N/A   Occupational History    . disabled    Social History Main Topics  . Smoking status: Never Smoker   . Smokeless tobacco: Never Used  . Alcohol Use: No  . Drug Use: No  . Sexually Active: Not on file   Other Topics Concern  . Not on file   Social History Narrative  . No narrative on file    Family History  Problem Relation Age of Onset  . Hypertension Mother   . Colon polyps Mother   . Atrial fibrillation Mother   . Heart disease Father   . Heart attack Father   . Diabetes Father   . Hypertension Sister   . Heart attack Paternal Grandmother   . Breast cancer Paternal Aunt   . Colon cancer Maternal Uncle   . Colon cancer Paternal Uncle   . Colon polyps Maternal Grandmother   . Stroke Maternal Grandmother   . Diabetes Paternal Grandfather   . Stroke Maternal Grandfather     Review of Systems:  As stated in the HPI and otherwise negative.   BP 135/88  Pulse 74  Ht 5\' 8"  (1.727 m)  Wt 242 lb (109.77 kg)  BMI 36.80 kg/m2  Physical Examination: General: Well developed, well nourished, NAD HEENT: OP clear, mucus membranes moist SKIN: warm, dry. No rashes. Neuro: No focal deficits Musculoskeletal: Muscle strength 5/5 all ext Psychiatric: Mood and affect normal Neck: No JVD, no carotid bruits, no thyromegaly, no lymphadenopathy. Lungs:Clear bilaterally, no wheezes, rhonci, crackles Cardiovascular: Regular rate and rhythm. No murmurs, gallops or rubs. Abdomen:Soft. Bowel sounds present. Non-tender.  Extremities: No lower extremity edema. Pulses are 2 + in the bilateral DP/PT.

## 2011-09-23 NOTE — Patient Instructions (Signed)
Your physician wants you to follow-up in: 12 months.You will receive a reminder letter in the mail two months in advance. If you don't receive a letter, please call our office to schedule the follow-up appointment.  Your physician has requested that you have a stress echocardiogram. For further information please visit [email or web address]. Please follow instruction sheet as given.  

## 2011-09-26 ENCOUNTER — Other Ambulatory Visit: Payer: Self-pay | Admitting: *Deleted

## 2011-09-26 DIAGNOSIS — R079 Chest pain, unspecified: Secondary | ICD-10-CM

## 2011-09-27 ENCOUNTER — Other Ambulatory Visit (HOSPITAL_COMMUNITY): Payer: Medicare Other

## 2011-09-27 ENCOUNTER — Ambulatory Visit (HOSPITAL_COMMUNITY): Payer: Medicare Other | Attending: Cardiovascular Disease

## 2011-09-27 DIAGNOSIS — I379 Nonrheumatic pulmonary valve disorder, unspecified: Secondary | ICD-10-CM | POA: Insufficient documentation

## 2011-09-27 DIAGNOSIS — I059 Rheumatic mitral valve disease, unspecified: Secondary | ICD-10-CM | POA: Insufficient documentation

## 2011-09-27 DIAGNOSIS — I10 Essential (primary) hypertension: Secondary | ICD-10-CM | POA: Insufficient documentation

## 2011-09-27 DIAGNOSIS — I079 Rheumatic tricuspid valve disease, unspecified: Secondary | ICD-10-CM | POA: Insufficient documentation

## 2011-09-27 DIAGNOSIS — R079 Chest pain, unspecified: Secondary | ICD-10-CM

## 2011-09-27 DIAGNOSIS — R072 Precordial pain: Secondary | ICD-10-CM

## 2011-09-27 NOTE — Progress Notes (Signed)
Echocardiogram performed.  

## 2011-09-29 ENCOUNTER — Encounter: Payer: Self-pay | Admitting: Nurse Practitioner

## 2011-09-29 ENCOUNTER — Ambulatory Visit (INDEPENDENT_AMBULATORY_CARE_PROVIDER_SITE_OTHER): Payer: Medicare Other | Admitting: Nurse Practitioner

## 2011-09-29 DIAGNOSIS — R079 Chest pain, unspecified: Secondary | ICD-10-CM

## 2011-09-29 NOTE — Procedures (Signed)
Exercise Treadmill Test  Pre-Exercise Testing Evaluation Rhythm: normal sinus  Rate: 84   PR:  .16 QRS:  .08  QT:  .37 QTc: .44     Test  Exercise Tolerance Test Ordering MD: Melene Muller, MD  Interpreting MD: Ward Givens , NP  Unique Test No: 1  Treadmill:  1  Indication for ETT: chest pain - rule out ischemia  Contraindication to ETT: No   Stress Modality: exercise - treadmill  Cardiac Imaging Performed: non   Protocol: standard Bruce - maximal  Max BP:  191/99  Max MPHR (bpm):  179 85% MPR (bpm):  152  MPHR obtained (bpm):  160 % MPHR obtained:  89%  Reached 85% MPHR (min:sec):  4:13 Total Exercise Time (min-sec):  6:00  Workload in METS:  7.0 Borg Scale: 15  Reason ETT Terminated:  desired heart rate attained    ST Segment Analysis At Rest: normal ST segments - no evidence of significant ST depression With Exercise: no evidence of significant ST depression  Other Information Arrhythmia:  No Angina during ETT:  absent (0) Quality of ETT:  diagnostic  ETT Interpretation:  normal - no evidence of ischemia by ST analysis  Comments: Good exercise tolerance  Recommendations: Follow-up PRN

## 2011-11-04 ENCOUNTER — Ambulatory Visit: Payer: Medicare Other | Admitting: Internal Medicine

## 2011-11-05 ENCOUNTER — Other Ambulatory Visit: Payer: Self-pay | Admitting: Cardiovascular Disease

## 2011-11-07 ENCOUNTER — Other Ambulatory Visit: Payer: Self-pay | Admitting: Urology

## 2011-11-07 ENCOUNTER — Ambulatory Visit
Admission: RE | Admit: 2011-11-07 | Discharge: 2011-11-07 | Disposition: A | Discharge status: Home / Self Care | Payer: Medicare Other | Source: Ambulatory Visit | Attending: Urology | Admitting: Urology

## 2011-11-07 DIAGNOSIS — N200 Calculus of kidney: Secondary | ICD-10-CM

## 2011-11-07 DIAGNOSIS — R300 Dysuria: Secondary | ICD-10-CM | POA: Insufficient documentation

## 2011-11-12 ENCOUNTER — Emergency Department (HOSPITAL_COMMUNITY): Payer: Medicare Other | Admitting: Anesthesiology

## 2011-11-12 ENCOUNTER — Encounter (HOSPITAL_COMMUNITY): Payer: Self-pay | Admitting: Anesthesiology

## 2011-11-12 ENCOUNTER — Encounter (HOSPITAL_COMMUNITY): Payer: Self-pay | Admitting: Emergency Medicine

## 2011-11-12 ENCOUNTER — Other Ambulatory Visit: Payer: Self-pay | Admitting: Urology

## 2011-11-12 ENCOUNTER — Observation Stay (HOSPITAL_COMMUNITY)
Admission: EM | Admit: 2011-11-12 | Discharge: 2011-11-14 | Disposition: A | Discharge status: Home / Self Care | Payer: Medicare Other | Attending: Urology | Admitting: Urology

## 2011-11-12 ENCOUNTER — Encounter (HOSPITAL_COMMUNITY)
Admission: EM | Disposition: A | Discharge status: Home / Self Care | Payer: Self-pay | Source: Home / Self Care | Attending: Emergency Medicine

## 2011-11-12 DIAGNOSIS — E039 Hypothyroidism, unspecified: Secondary | ICD-10-CM | POA: Insufficient documentation

## 2011-11-12 DIAGNOSIS — M329 Systemic lupus erythematosus, unspecified: Secondary | ICD-10-CM | POA: Insufficient documentation

## 2011-11-12 DIAGNOSIS — K219 Gastro-esophageal reflux disease without esophagitis: Secondary | ICD-10-CM | POA: Insufficient documentation

## 2011-11-12 DIAGNOSIS — I10 Essential (primary) hypertension: Secondary | ICD-10-CM | POA: Insufficient documentation

## 2011-11-12 DIAGNOSIS — E28319 Asymptomatic premature menopause: Secondary | ICD-10-CM

## 2011-11-12 DIAGNOSIS — N390 Urinary tract infection, site not specified: Secondary | ICD-10-CM

## 2011-11-12 DIAGNOSIS — N200 Calculus of kidney: Secondary | ICD-10-CM

## 2011-11-12 DIAGNOSIS — R197 Diarrhea, unspecified: Secondary | ICD-10-CM

## 2011-11-12 DIAGNOSIS — N201 Calculus of ureter: Principal | ICD-10-CM | POA: Insufficient documentation

## 2011-11-12 DIAGNOSIS — K589 Irritable bowel syndrome without diarrhea: Secondary | ICD-10-CM

## 2011-11-12 DIAGNOSIS — N1330 Unspecified hydronephrosis: Secondary | ICD-10-CM | POA: Insufficient documentation

## 2011-11-12 DIAGNOSIS — Z79899 Other long term (current) drug therapy: Secondary | ICD-10-CM | POA: Insufficient documentation

## 2011-11-12 HISTORY — PX: CYSTOSCOPY W/ URETERAL STENT PLACEMENT: SHX1429

## 2011-11-12 HISTORY — PX: URETERAL STENT PLACEMENT: SHX822

## 2011-11-12 LAB — URINALYSIS, ROUTINE W REFLEX MICROSCOPIC
Bilirubin Urine: NEGATIVE
Glucose, UA: NEGATIVE mg/dL
Hgb urine dipstick: NEGATIVE
Ketones, ur: NEGATIVE mg/dL
Nitrite: NEGATIVE
Protein, ur: NEGATIVE mg/dL
Specific Gravity, Urine: 1.014 (ref 1.005–1.030)
Urobilinogen, UA: 0.2 mg/dL (ref 0.0–1.0)
pH: 5.5 (ref 5.0–8.0)

## 2011-11-12 LAB — URINE MICROSCOPIC-ADD ON

## 2011-11-12 LAB — BASIC METABOLIC PANEL
BUN: 13 mg/dL (ref 6–23)
Chloride: 100 mEq/L (ref 96–112)
GFR calc Af Amer: >90 mL/min (ref >90)
GFR calc non Af Amer: >90 mL/min (ref >90)
Potassium: 3.7 mEq/L (ref 3.5–5.1)
Sodium: 137 mEq/L (ref 135–145)

## 2011-11-12 LAB — CBC
MCHC: 34.1 g/dL (ref 30.0–36.0)
Platelets: 234 10*3/uL (ref 150–400)
RDW: 12.5 % (ref 11.5–15.5)
WBC: 8.4 10*3/uL (ref 4.0–10.5)

## 2011-11-12 SURGERY — CYSTOSCOPY, WITH RETROGRADE PYELOGRAM AND URETERAL STENT INSERTION
Anesthesia: General | Site: Ureter | Laterality: Right | Wound class: Clean Contaminated

## 2011-11-12 MED ORDER — ONDANSETRON HCL 4 MG/2ML IJ SOLN: 4.0000 mg | INTRAMUSCULAR | Status: DC | PRN

## 2011-11-12 MED ORDER — DEXAMETHASONE SODIUM PHOSPHATE 10 MG/ML IJ SOLN
INTRAMUSCULAR | Status: DC | PRN
  Administered 2011-11-12: 10 mg via INTRAVENOUS

## 2011-11-12 MED ORDER — FENTANYL CITRATE 0.05 MG/ML IJ SOLN
INTRAMUSCULAR | Status: DC | PRN
  Administered 2011-11-12 (×2): 50 ug via INTRAVENOUS

## 2011-11-12 MED ORDER — ACETAMINOPHEN 10 MG/ML IV SOLN
INTRAVENOUS | Status: AC
  Filled 2011-11-12: qty 100

## 2011-11-12 MED ORDER — HYDROXYCHLOROQUINE SULFATE 200 MG PO TABS
400.0000 mg | ORAL_TABLET | Freq: Every day | ORAL | Status: DC
  Administered 2011-11-13: 400 mg via ORAL
  Filled 2011-11-12 (×2): qty 2

## 2011-11-12 MED ORDER — KETOROLAC TROMETHAMINE 30 MG/ML IJ SOLN
30.0000 mg | Freq: Once | INTRAMUSCULAR | Status: AC
  Administered 2011-11-12: 30 mg via INTRAVENOUS
  Filled 2011-11-12: qty 1

## 2011-11-12 MED ORDER — ESTRADIOL 1 MG PO TABS
1.5000 mg | ORAL_TABLET | Freq: Every day | ORAL | Status: DC
  Administered 2011-11-13: 1.5 mg via ORAL
  Filled 2011-11-12 (×3): qty 1.5

## 2011-11-12 MED ORDER — HYDROMORPHONE HCL PF 1 MG/ML IJ SOLN
1.0000 mg | Freq: Once | INTRAMUSCULAR | Status: AC
  Administered 2011-11-12: 1 mg via INTRAVENOUS
  Filled 2011-11-12: qty 1

## 2011-11-12 MED ORDER — STERILE WATER FOR IRRIGATION IR SOLN
Status: DC | PRN
  Administered 2011-11-12: 3000 mL

## 2011-11-12 MED ORDER — PANTOPRAZOLE SODIUM 40 MG PO TBEC
40.0000 mg | DELAYED_RELEASE_TABLET | Freq: Every day | ORAL | Status: DC
  Administered 2011-11-13: 40 mg via ORAL
  Filled 2011-11-12 (×2): qty 1

## 2011-11-12 MED ORDER — IOHEXOL 300 MG/ML  SOLN
INTRAMUSCULAR | Status: DC | PRN
  Administered 2011-11-12: 50 mL

## 2011-11-12 MED ORDER — 0.9 % SODIUM CHLORIDE (POUR BTL) OPTIME
TOPICAL | Status: DC | PRN
  Administered 2011-11-12: 1000 mL

## 2011-11-12 MED ORDER — LISINOPRIL 20 MG PO TABS
20.0000 mg | ORAL_TABLET | Freq: Every day | ORAL | Status: DC
  Administered 2011-11-13: 20 mg via ORAL
  Filled 2011-11-12 (×3): qty 1

## 2011-11-12 MED ORDER — PIPERACILLIN-TAZOBACTAM 3.375 G IVPB
3.3750 g | Freq: Three times a day (TID) | INTRAVENOUS | Status: DC
  Administered 2011-11-12 – 2011-11-13 (×4): 3.375 g via INTRAVENOUS
  Filled 2011-11-12 (×6): qty 50

## 2011-11-12 MED ORDER — LACTATED RINGERS IV SOLN
INTRAVENOUS | Status: DC | PRN
  Administered 2011-11-12: 15:00:00 via INTRAVENOUS

## 2011-11-12 MED ORDER — IOHEXOL 300 MG/ML  SOLN
INTRAMUSCULAR | Status: AC
  Filled 2011-11-12: qty 1

## 2011-11-12 MED ORDER — HYDROCODONE-ACETAMINOPHEN 5-325 MG PO TABS
1.0000 | ORAL_TABLET | ORAL | Status: DC | PRN
  Administered 2011-11-12: 1 via ORAL
  Filled 2011-11-12: qty 1

## 2011-11-12 MED ORDER — PIPERACILLIN-TAZOBACTAM 3.375 G IVPB 30 MIN: 3.3750 g | Freq: Once | INTRAVENOUS | Status: DC

## 2011-11-12 MED ORDER — DEXTROSE-NACL 5-0.45 % IV SOLN: INTRAVENOUS | Status: DC

## 2011-11-12 MED ORDER — HYDROCHLOROTHIAZIDE 12.5 MG PO CAPS
12.5000 mg | ORAL_CAPSULE | Freq: Every day | ORAL | Status: DC
  Administered 2011-11-13: 12.5 mg via ORAL
  Filled 2011-11-12 (×3): qty 1

## 2011-11-12 MED ORDER — PROMETHAZINE HCL 25 MG/ML IJ SOLN: 6.2500 mg | INTRAMUSCULAR | Status: DC | PRN

## 2011-11-12 MED ORDER — FENTANYL CITRATE 0.05 MG/ML IJ SOLN
25.0000 ug | INTRAMUSCULAR | Status: DC | PRN
  Administered 2011-11-12 (×2): 50 ug via INTRAVENOUS

## 2011-11-12 MED ORDER — LEVOTHYROXINE SODIUM 112 MCG PO TABS
112.0000 ug | ORAL_TABLET | Freq: Every day | ORAL | Status: DC
  Administered 2011-11-13: 112 ug via ORAL
  Filled 2011-11-12 (×3): qty 1

## 2011-11-12 MED ORDER — LIDOCAINE HCL (CARDIAC) 20 MG/ML IV SOLN
INTRAVENOUS | Status: DC | PRN
  Administered 2011-11-12: 100 mg via INTRAVENOUS

## 2011-11-12 MED ORDER — KETOROLAC TROMETHAMINE 30 MG/ML IJ SOLN: 15.0000 mg | Freq: Once | INTRAMUSCULAR | Status: DC | PRN

## 2011-11-12 MED ORDER — ACETAMINOPHEN 10 MG/ML IV SOLN
INTRAVENOUS | Status: DC | PRN
  Administered 2011-11-12: 1000 mg via INTRAVENOUS

## 2011-11-12 MED ORDER — HYDROMORPHONE HCL PF 1 MG/ML IJ SOLN
0.5000 mg | INTRAMUSCULAR | Status: DC | PRN
  Administered 2011-11-13: 1 mg via INTRAVENOUS
  Filled 2011-11-12: qty 1

## 2011-11-12 MED ORDER — GABAPENTIN 300 MG PO CAPS
300.0000 mg | ORAL_CAPSULE | Freq: Two times a day (BID) | ORAL | Status: DC
  Administered 2011-11-12 – 2011-11-13 (×3): 300 mg via ORAL
  Filled 2011-11-12 (×5): qty 1

## 2011-11-12 MED ORDER — PROPOFOL 10 MG/ML IV EMUL
INTRAVENOUS | Status: DC | PRN
  Administered 2011-11-12: 200 mg via INTRAVENOUS

## 2011-11-12 MED ORDER — SENNOSIDES-DOCUSATE SODIUM 8.6-50 MG PO TABS
2.0000 | ORAL_TABLET | Freq: Every day | ORAL | Status: DC
  Administered 2011-11-12: 2 via ORAL
  Filled 2011-11-12 (×3): qty 2

## 2011-11-12 MED ORDER — ONDANSETRON HCL 4 MG/2ML IJ SOLN
INTRAMUSCULAR | Status: DC | PRN
  Administered 2011-11-12: 4 mg via INTRAVENOUS

## 2011-11-12 MED ORDER — FENTANYL CITRATE 0.05 MG/ML IJ SOLN
INTRAMUSCULAR | Status: AC
  Filled 2011-11-12: qty 2

## 2011-11-12 SURGICAL SUPPLY — 8 items
ADAPTER CATH URET PLST 4-6FR (CATHETERS) ×1 IMPLANT
ADPR CATH URET STRL DISP 4-6FR (CATHETERS) ×1
DRAPE CAMERA CLOSED 9X96 (DRAPES) ×1 IMPLANT
GOWN STRL REIN XL XLG (GOWN DISPOSABLE) ×2 IMPLANT
GUIDEWIRE STR DUAL SENSOR (WIRE) ×1 IMPLANT
PACK CYSTO (CUSTOM PROCEDURE TRAY) ×1 IMPLANT
STENT CONTOUR 6FRX24X.038 (STENTS) ×1 IMPLANT
STENT CONTOUR 6FRX26X.038 (STENTS) ×1 IMPLANT

## 2011-11-12 NOTE — Anesthesia Preprocedure Evaluation (Signed)
Anesthesia Evaluation  Patient identified by MRN, date of birth, ID band Patient awake    Reviewed: Allergy & Precautions, H&P , NPO status , Patient's Chart, lab work & pertinent test results  Airway Mallampati: II TM Distance: <3 FB Neck ROM: Full    Dental No notable dental hx.    Pulmonary neg pulmonary ROS,  breath sounds clear to auscultation  Pulmonary exam normal       Cardiovascular hypertension, Pt. on medications Rhythm:Regular Rate:Normal     Neuro/Psych negative neurological ROS  negative psych ROS   GI/Hepatic Neg liver ROS, GERD-  Medicated,  Endo/Other  Hypothyroidism   Renal/GU negative Renal ROS  negative genitourinary   Musculoskeletal negative musculoskeletal ROS (+)   Abdominal   Peds negative pediatric ROS (+)  Hematology negative hematology ROS (+)   Anesthesia Other Findings   Reproductive/Obstetrics negative OB ROS                           Anesthesia Physical Anesthesia Plan  ASA: II  Anesthesia Plan: General   Post-op Pain Management:    Induction: Intravenous  Airway Management Planned: LMA and Oral ETT  Additional Equipment:   Intra-op Plan:   Post-operative Plan: Extubation in OR  Informed Consent: I have reviewed the patients History and Physical, chart, labs and discussed the procedure including the risks, benefits and alternatives for the proposed anesthesia with the patient or authorized representative who has indicated his/her understanding and acceptance.   Dental advisory given  Plan Discussed with: CRNA and Surgeon  Anesthesia Plan Comments:         Anesthesia Quick Evaluation

## 2011-11-12 NOTE — Transfer of Care (Signed)
Immediate Anesthesia Transfer of Care Note  Patient: [name]  Procedure(s) Performed: Procedure(s) (LRB): CYSTOSCOPY WITH RETROGRADE PYELOGRAM/URETERAL STENT PLACEMENT (Right)  Patient Location: PACU  Anesthesia Type: General  Level of Consciousness: awake, alert  and oriented  Airway & Oxygen Therapy: Patient Spontanous Breathing and Patient connected to face mask oxygen  Post-op Assessment: Report given to PACU RN and Post -op Vital signs reviewed and stable  Post vital signs: Reviewed and stable  Complications: No apparent anesthesia complications

## 2011-11-12 NOTE — Anesthesia Procedure Notes (Signed)
Procedure Name: LMA Insertion Date/Time: 11/12/2011 3:18 PM Performed by: Leroy Libman L Patient Re-evaluated:Patient Re-evaluated prior to inductionOxygen Delivery Method: Circle system utilized Preoxygenation: Pre-oxygenation with 100% oxygen Intubation Type: IV induction Ventilation: Mask ventilation without difficulty LMA: LMA with gastric port inserted LMA Size: 4.0 Tube type: Oral Number of attempts: 1 Placement Confirmation: positive ETCO2 and breath sounds checked- equal and bilateral Tube secured with: Tape Dental Injury: Teeth and Oropharynx as per pre-operative assessment

## 2011-11-12 NOTE — Brief Op Note (Signed)
11/12/2011  3:40 PM  PATIENT:  [name]  41 y.o. female  PRE-OPERATIVE DIAGNOSIS:  right ureteral stone  POST-OPERATIVE DIAGNOSIS:  right ureteral stone  PROCEDURE:  Procedure(s) (LRB): CYSTOSCOPY WITH RETROGRADE PYELOGRAM/URETERAL STENT PLACEMENT (Right)  SURGEON:  Surgeon(s) and Role:    * Sebastian Ache, MD - Primary   ASSISTANTS: none   ANESTHESIA:   general  EBL:   none  BLOOD ADMINISTERED:none  DRAINS: none   FINDINGS: 1 - Minimal Rt hydro 2 - Filling defect in distal ureter c/w stone 3 - Cloudy urine in rt kidney / ureter above stone 4 - Inflamed bladder  LOCAL MEDICATIONS USED:  NONE  SPECIMEN:  No Specimen  DISPOSITION OF SPECIMEN:  N/A  COUNTS:  YES  TOURNIQUET:  * No tourniquets in log *  DICTATION: .Other Dictation: Dictation Number [id number]  PLAN OF CARE: Admit for overnight observation  PATIENT DISPOSITION:  PACU - hemodynamically stable.   Delay start of Pharmacological VTE agent (>24hrs) due to surgical blood loss or risk of bleeding: yes

## 2011-11-12 NOTE — ED Notes (Signed)
Pt w/ right flank pain. Dx at Dr. Jinny Sanders office w/ "5mm" kidney stone. On flomax and cipro at home and has been trying to pass. Pt w/ severe pain today and urologist instructed her to come to ED.

## 2011-11-12 NOTE — Preoperative (Signed)
Beta Blockers   Reason not to administer Beta Blockers:Not Applicable 

## 2011-11-12 NOTE — H&P (Signed)
[name] is an 41 y.o. female.   Chief Complaint: Rt Flank Pain, Kidney Stone HPI:   41yo WF with h/o recurrent kidney stones presents with refractory rt flank pain, nausea, subjective fevers and CT evicence of Rt distal ureteral stone several days ago w/o passage.  Deneies objective fever, but UA / gram stain today with many bacteria.  PMH sig for hypothyroid, lap chole, ex/lap appy, TAH/BSO for benign disease, back surgery  Denies blood thinners, DM or CV disease.   Past Medical History  Diagnosis Date  . Hypothyroidism   . Hypertension     a  . Anxiety   . Depression     during divorce & legal matters  . Polycystic ovarian disease   . Osteoarthritis   . GERD (gastroesophageal reflux disease)   . IBS (irritable bowel syndrome)   . Kidney stones   . Lupus   . Midsternal chest pain     a. 09/27/2011 Echo: EF 55-65%, mild LVH    Past Surgical History  Procedure Date  . Eye surgery     Retinal tears surgery bilateral  . Appendectomy     exploratory lap  . Abdominal hysterectomy   . Tonsillectomy   . Carpal tunnel release     bilateral  . Cholecystectomy   . Plantar fascia surgery     right  . Back surgery 2003    disc  . Plantar fascia surgery 02/2011    left    Family History  Problem Relation Age of Onset  . Hypertension Mother   . Colon polyps Mother   . Atrial fibrillation Mother   . Heart disease Father   . Heart attack Father   . Diabetes Father   . Hypertension Sister   . Heart attack Paternal Grandmother   . Breast cancer Paternal Aunt   . Colon cancer Maternal Uncle   . Colon cancer Paternal Uncle   . Colon polyps Maternal Grandmother   . Stroke Maternal Grandmother   . Diabetes Paternal Grandfather   . Stroke Maternal Grandfather    Social History:  reports that she has never smoked. She has never used smokeless tobacco. She reports that she does not drink alcohol or use illicit drugs.  Allergies:  Allergies  Allergen Reactions  .  Nalbuphine     Nubain caused respiratory distress & rash  . Oxycodone-Acetaminophen Diarrhea    Tylox, Percocet & Darvocet  caused facial rash but she has taken Vicodin  . Sulfamethoxazole W-Trimethoprim     rash  . Ivp Dye (Iodinated Diagnostic Agents) Other (See Comments)    Hot in face and red     (Not in a hospital admission)  Results for orders placed during the hospital encounter of 11/12/11 (from the past 48 hour(s))  URINALYSIS, ROUTINE W REFLEX MICROSCOPIC     Status: Abnormal   Collection Time   11/12/11 10:35 AM      Component Value Range Comment   Color, Urine YELLOW  YELLOW    APPearance HAZY (*) CLEAR    Specific Gravity, Urine 1.014  1.005 - 1.030    pH 5.5  5.0 - 8.0    Glucose, UA NEGATIVE  NEGATIVE mg/dL    Hgb urine dipstick NEGATIVE  NEGATIVE    Bilirubin Urine NEGATIVE  NEGATIVE    Ketones, ur NEGATIVE  NEGATIVE mg/dL    Protein, ur NEGATIVE  NEGATIVE mg/dL    Urobilinogen, UA 0.2  0.0 - 1.0 mg/dL    Nitrite  NEGATIVE  NEGATIVE    Leukocytes, UA TRACE (*) NEGATIVE   URINE MICROSCOPIC-ADD ON     Status: Abnormal   Collection Time   11/12/11 10:35 AM      Component Value Range Comment   Squamous Epithelial / LPF FEW (*) RARE    WBC, UA 3-6  <3 WBC/hpf    Bacteria, UA MANY (*) RARE   CBC     Status: Normal   Collection Time   11/12/11 12:40 PM      Component Value Range Comment   WBC 8.4  4.0 - 10.5 K/uL    RBC 4.76  3.87 - 5.11 MIL/uL    Hemoglobin 13.5  12.0 - 15.0 g/dL    HCT 21.3  08.6 - 57.8 %    MCV 83.2  78.0 - 100.0 fL    MCH 28.4  26.0 - 34.0 pg    MCHC 34.1  30.0 - 36.0 g/dL    RDW 46.9  62.9 - 52.8 %    Platelets 234  150 - 400 K/uL   BASIC METABOLIC PANEL     Status: Normal   Collection Time   11/12/11 12:40 PM      Component Value Range Comment   Sodium 137  135 - 145 mEq/L    Potassium 3.7  3.5 - 5.1 mEq/L    Chloride 100  96 - 112 mEq/L    CO2 23  19 - 32 mEq/L    Glucose, Bld 80  70 - 99 mg/dL    BUN 13  6 - 23 mg/dL     Creatinine, Ser 4.13  0.50 - 1.10 mg/dL    Calcium 9.1  8.4 - 24.4 mg/dL    GFR calc non Af Amer >90  >90 mL/min    GFR calc Af Amer >90  >90 mL/min    No results found.  Review of Systems  Constitutional: Positive for fever, chills and malaise/fatigue.  HENT: Negative.   Eyes: Negative.   Respiratory: Negative.   Cardiovascular: Negative.   Gastrointestinal: Negative.   Genitourinary: Negative.   Musculoskeletal: Negative.   Skin: Negative.   Neurological: Negative.   Endo/Heme/Allergies: Negative.   Psychiatric/Behavioral: Negative.     Blood pressure 132/67, pulse 75, temperature 98.9 F (37.2 C), temperature source Oral, weight 108.863 kg (240 lb), SpO2 99.00%. Physical Exam  Constitutional: [name].  HENT:  Head: Normocephalic.  Eyes: Pupils are equal, round, and reactive to light.  Neck: Normal range of motion. Neck supple.  Cardiovascular: Normal rate.   Respiratory: Effort normal.  GI: Soft. Bowel sounds are normal.       Multiple scars w/o mass or hernia, mild obesity  Genitourinary:       Moderate Rt CVAT  Musculoskeletal: Normal range of motion.  Neurological: She is alert.  Skin: Skin is warm and dry.  Psychiatric: She has a normal mood and affect. Her behavior is normal. Judgment and thought content normal.     Assessment/Plan  1 - Rt Ureteral Stone / Flank Pain / UTI - Pt's pain refractory, and UA and symptoms now highly suggestive of concomitant UTI. Explained that needs renal drainage to alleviate infection with definitive surgery in a staged fashion as the safest way to proceed. Options of renal drainage including stent v. neph tube discussed. Pt wants neph tube. I explained risks of bleeding, infection, damage to kidney / ureter / bladder, or need for neph tube if unable to place stent.  2 - Admit post-procedure for ABX / Monitoring.  [name] 11/12/2011, 1:25 PM

## 2011-11-12 NOTE — ED Provider Notes (Signed)
History     CSN: [id number]  Arrival date & time 11/12/11  7829   First MD Initiated Contact with Patient 11/12/11 1009      Chief Complaint  Patient presents with  . Flank Pain    (Consider location/radiation/quality/duration/timing/severity/associated sxs/prior treatment) HPI Comments: Alexyia Guarino Stump 41 y.o. female   The chief complaint is: Patient presents with:   Flank Pain   The patient has medical history significant for:   Past Medical History:   Hypothyroidism                                               Hypertension                                                   Comment:a   Anxiety                                                      Depression                                                     Comment:during divorce & legal matters   Polycystic ovarian disease                                   Osteoarthritis                                               GERD (gastroesophageal reflux disease)                       IBS (irritable bowel syndrome)                               Kidney stones                                                Lupus                                                        Midsternal chest pain                                          Comment:a. 09/27/2011 Echo: EF 55-65%, mild LVH  Patient with a history of kidney stones, presents with a one week history right flank pain with radiation to the RUQ. Associated symptoms include, subjective fever, chills, nausea, vomiting, dysuria, frequency, and urgency. Patient states that in the past she has been able to pass the stones. There has been one incident similar to this required lithotripsy in 2000. On Monday she visited her PCP and a CT he ordered showed a 5mm stone with hydronephrosis. Patient at that time was also diagnosed with a  UTI and prescribed Cipro, Flomax, and Vicodin. Patient is here today because pain has not subsided. Denies CP, SOB, palpitations.       Patient is a 41 y.o.  female presenting with flank pain. The history is provided by the patient.  Flank Pain Associated symptoms include abdominal pain, chills, a fever, nausea and vomiting. Pertinent negatives include no chest pain.    Past Medical History  Diagnosis Date  . Hypothyroidism   . Hypertension     a  . Anxiety   . Depression     during divorce & legal matters  . Polycystic ovarian disease   . Osteoarthritis   . GERD (gastroesophageal reflux disease)   . IBS (irritable bowel syndrome)   . Kidney stones   . Lupus   . Midsternal chest pain     a. 09/27/2011 Echo: EF 55-65%, mild LVH    Past Surgical History  Procedure Date  . Eye surgery     Retinal tears surgery bilateral  . Appendectomy     exploratory lap  . Abdominal hysterectomy   . Tonsillectomy   . Carpal tunnel release     bilateral  . Cholecystectomy   . Plantar fascia surgery     right  . Back surgery 2003    disc  . Plantar fascia surgery 02/2011    left    Family History  Problem Relation Age of Onset  . Hypertension Mother   . Colon polyps Mother   . Atrial fibrillation Mother   . Heart disease Father   . Heart attack Father   . Diabetes Father   . Hypertension Sister   . Heart attack Paternal Grandmother   . Breast cancer Paternal Aunt   . Colon cancer Maternal Uncle   . Colon cancer Paternal Uncle   . Colon polyps Maternal Grandmother   . Stroke Maternal Grandmother   . Diabetes Paternal Grandfather   . Stroke Maternal Grandfather     History  Substance Use Topics  . Smoking status: Never Smoker   . Smokeless tobacco: Never Used  . Alcohol Use: No    OB History    Grav Para Term Preterm Abortions TAB SAB Ect Mult Living                  Review of Systems  Constitutional: Positive for fever, chills and activity change.  Respiratory: Negative for shortness of breath.   Cardiovascular: Negative for chest pain and palpitations.  Gastrointestinal: Positive for nausea, vomiting, abdominal  pain and constipation.  Genitourinary: Positive for dysuria, urgency, frequency and flank pain.  Musculoskeletal: Positive for back pain.    Allergies  Nalbuphine; Oxycodone-acetaminophen; Sulfamethoxazole w-trimethoprim; and Ivp dye  Home Medications   Current Outpatient Rx  Name Route Sig Dispense Refill  . ASPIRIN 81 MG PO TABS Oral Take 81 mg by mouth daily.      [name] CETIRIZINE HCL 10 MG PO TABS Oral Take 10 mg by mouth daily.      [name]  VITAMIN D3 1000 UNITS PO CAPS Oral Take by mouth 2 (two) times daily.      . CYCLOBENZAPRINE HCL 5 MG PO TABS  1-2 qhs prn neck pain 20 tablet 0  . ESTRADIOL 1 MG PO TABS Oral Take 1.5 tablets (1.5 mg total) by mouth daily. 45 tablet 11  . ALLEGRA PO Oral Take by mouth daily.    [name] FLUTICASONE PROPIONATE 50 MCG/ACT NA SUSP Nasal Place 1 spray into the nose 2 (two) times daily as needed for rhinitis. 16 g 2  . GABAPENTIN 300 MG PO CAPS Oral Take 300 mg by mouth 2 (two) times daily.     [name] HYDROCHLOROTHIAZIDE 12.5 MG PO CAPS  TAKE ONE CAPSULE BY MOUTH EVERY DAY 30 capsule 6  . HYDROXYCHLOROQUINE SULFATE 200 MG PO TABS Oral Take 200 mg by mouth. 2 by mouth in the am     . LEVOTHYROXINE SODIUM 112 MCG PO TABS Oral Take 1 tablet (112 mcg total) by mouth daily. 90 tablet 3  . LISINOPRIL 20 MG PO TABS  TAKE ONE TABLET BY MOUTH EVERY DAY 30 tablet 6  . MELOXICAM 7.5 MG PO TABS Oral Take 7.5 mg by mouth 2 (two) times daily.      . NYSTATIN 100000 UNIT/GM EX POWD Topical Apply topically 4 (four) times daily. 60 g 6  . OMEPRAZOLE 40 MG PO CPDR Oral Take 1 capsule (40 mg total) by mouth daily. 30 capsule 11  . ALIGN 4 MG PO CAPS Oral Take 1 capsule by mouth daily. 30 capsule 0  . RANITIDINE HCL 150 MG PO CAPS Oral Take 150 mg by mouth daily.        BP 159/89  Pulse 90  Temp 98.9 F (37.2 C) (Oral)  Wt 240 lb (108.863 kg)  SpO2 100%  Physical Exam  Nursing note and vitals reviewed. Constitutional: She appears well-developed.  HENT:  Head: Normocephalic and  atraumatic.  Mouth/Throat: Oropharynx is clear and moist.  Eyes: Conjunctivae and EOM are normal. Pupils are equal, round, and reactive to light. No scleral icterus.  Neck: Normal range of motion. Neck supple.  Cardiovascular: Normal rate, regular rhythm and normal heart sounds.   Pulmonary/Chest: Effort normal and breath sounds normal.  Abdominal: Soft. Normal appearance and bowel sounds are normal. There is tenderness in the right upper quadrant. There is CVA tenderness.         Tenderness to palpation of the RUQ and R flank  Neurological: She is alert.  Skin: Skin is warm and dry.    ED Course  Procedures (including critical care time)   Labs Reviewed  URINALYSIS, ROUTINE W REFLEX MICROSCOPIC   No results found. RADIOLOGY REPORT*  Clinical Data: Gross hematuria today, history of kidney stones  CT ABDOMEN AND PELVIS WITHOUT CONTRAST  Technique: Multidetector CT imaging of the abdomen and pelvis was  performed following the standard protocol without intravenous  contrast.  Comparison: CT urogram of 02/14/2008  Findings: The lung bases are clear. The liver is very low in  attenuation consistent with severe fatty infiltration diffusely.  No focal hepatic abnormality is seen. Surgical clips are present  from prior cholecystectomy. The pancreas is normal in size and the  pancreatic duct is not dilated. The adrenal glands and spleen are  unremarkable. The stomach is distended with fluid and food debris.  Only small renal calculi are noted bilaterally. Several calculi  described previously in both kidneys are no longer seen. There is  very minimal fullness of  the right pelvocaliceal system and right  ureter to a point of partial obstruction by a 5 mm distal right  ureteral calculus located just inferior to the right SI joint and  several centimeters from the expected right UV junction. The left  ureter is normal in caliber.  The abdominal aorta is normal with no aneurysmal  dilatation. No  adenopathy is seen. The urinary bladder is moderately well seen  with no abnormality. The uterus has been surgically resected. No  adnexal lesion is seen. No fluid is noted within the pelvis.  There is an oval low attenuation structure in the left wall of the  vagina probably representing a Gartner's duct cyst. No abnormality  of the colon is seen. The appendix has previously been resected  and the terminal ileum is unremarkable. On bone window images,  there appears to be an old avulsion injury of the left femoral  lesser trochanter. Degenerative disc disease is present at the L5-  S1 level.   IMPRESSION:  1. Low grade hydronephrosis on the right caused by a 5 mm distal  right ureteral calculus several centimeters above the expected  right UV junction.  2. Small bilateral renal calculi.  3. Suspect old avulsion injury of the left femoral lesser  trochanter.  4. 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   Results for orders placed during the hospital encounter of 11/12/11  URINALYSIS, ROUTINE W REFLEX MICROSCOPIC      Component Value Range   Color, Urine YELLOW  YELLOW   APPearance HAZY (*) CLEAR   Specific Gravity, Urine 1.014  1.005 - 1.030   pH 5.5  5.0 - 8.0   Glucose, UA NEGATIVE  NEGATIVE mg/dL   Hgb urine dipstick NEGATIVE  NEGATIVE   Bilirubin Urine NEGATIVE  NEGATIVE   Ketones, ur NEGATIVE  NEGATIVE mg/dL   Protein, ur NEGATIVE  NEGATIVE mg/dL   Urobilinogen, UA 0.2  0.0 - 1.0 mg/dL   Nitrite NEGATIVE  NEGATIVE   Leukocytes, UA TRACE (*) NEGATIVE  URINE MICROSCOPIC-ADD ON      Component Value Range   Squamous Epithelial / LPF FEW (*) RARE   WBC, UA 3-6  <3 WBC/hpf   Bacteria, UA MANY (*) RARE  CBC      Component Value Range   WBC 8.4  4.0 - 10.5 K/uL   RBC 4.76  3.87 - 5.11 MIL/uL   Hemoglobin 13.5  12.0 - 15.0 g/dL   HCT 40.9  81.1 - 91.4 %   MCV 83.2  78.0 - 100.0 fL   MCH 28.4  26.0 - 34.0 pg   MCHC 34.1  30.0 - 36.0  g/dL   RDW 78.2  95.6 - 21.3 %   Platelets 234  150 - 400 K/uL  BASIC METABOLIC PANEL      Component Value Range   Sodium 137  135 - 145 mEq/L   Potassium 3.7  3.5 - 5.1 mEq/L   Chloride 100  96 - 112 mEq/L   CO2 23  19 - 32 mEq/L   Glucose, Bld 80  70 - 99 mg/dL   BUN 13  6 - 23 mg/dL   Creatinine, Ser 0.86  0.50 - 1.10 mg/dL   Calcium 9.1  8.4 - 57.8 mg/dL   GFR calc non Af Amer >90  >90 mL/min   GFR calc Af Amer >90  >90 mL/min     1. UTI (lower urinary tract infection)   2. Kidney stone on  right side   3. Hydronephrosis       MDM  Patient presents with a diagnosis from her PCP of UTI and a7mm kidney stone with hydronephrosis confirmed from CT report done on Monday. Patient given Vicodin, Cipro, and Flomax by PCP. UA: many bacteria infection still present. CBC & BMP: unremarkable. Patient pain described as 10/10. Patient given toradol and dilaudid in ED pain 7/10. Urology on-call, Dr. Berneice Heinrich, evaluated patient and she will be taken to the OR with plans for stent placement and overnight observation. No red flags for pyelonephritis. Patient care signed off to Urologic surgery.     Pixie Casino, PA-C 11/12/11 1331

## 2011-11-12 NOTE — Anesthesia Postprocedure Evaluation (Signed)
  Anesthesia Post-op Note  Patient: [name]  Procedure(s) Performed: Procedure(s) (LRB): CYSTOSCOPY WITH RETROGRADE PYELOGRAM/URETERAL STENT PLACEMENT (Right)  Patient Location: PACU  Anesthesia Type: General  Level of Consciousness: awake and alert   Airway and Oxygen Therapy: Patient Spontanous Breathing  Post-op Pain: mild  Post-op Assessment: Post-op Vital signs reviewed, Patient's Cardiovascular Status Stable, Respiratory Function Stable, Patent Airway and No signs of Nausea or vomiting  Post-op Vital Signs: stable  Complications: No apparent anesthesia complications

## 2011-11-13 LAB — URINE CULTURE
Colony Count: NO GROWTH
Culture: NO GROWTH

## 2011-11-13 MED ORDER — OXYBUTYNIN CHLORIDE ER 10 MG PO TB24
10.0000 mg | ORAL_TABLET | Freq: Every day | ORAL | Status: DC
  Administered 2011-11-13: 10 mg via ORAL
  Filled 2011-11-13 (×2): qty 1

## 2011-11-13 MED ORDER — CEPHALEXIN 500 MG PO CAPS
500.0000 mg | ORAL_CAPSULE | Freq: Two times a day (BID) | ORAL | Status: DC
  Administered 2011-11-13: 500 mg via ORAL
  Filled 2011-11-13 (×3): qty 1

## 2011-11-13 MED ORDER — AMOXICILLIN-POT CLAVULANATE 875-125 MG PO TABS
1.0000 | ORAL_TABLET | Freq: Two times a day (BID) | ORAL | Status: DC
  Filled 2011-11-13: qty 1

## 2011-11-13 MED ORDER — OXYCODONE-ACETAMINOPHEN 5-325 MG PO TABS: 1.0000 | ORAL_TABLET | ORAL | Status: DC | PRN

## 2011-11-13 MED ORDER — HYDROMORPHONE HCL 2 MG PO TABS
2.0000 mg | ORAL_TABLET | ORAL | Status: DC | PRN
  Administered 2011-11-13 – 2011-11-14 (×6): 2 mg via ORAL
  Filled 2011-11-13 (×6): qty 1

## 2011-11-13 MED ORDER — BELLADONNA ALKALOIDS-OPIUM 16.2-60 MG RE SUPP: 1.0000 | Freq: Four times a day (QID) | RECTAL | Status: DC | PRN

## 2011-11-13 MED ORDER — TAMSULOSIN HCL 0.4 MG PO CAPS
0.4000 mg | ORAL_CAPSULE | Freq: Every day | ORAL | Status: DC
  Administered 2011-11-13: 0.4 mg via ORAL
  Filled 2011-11-13 (×2): qty 1

## 2011-11-13 NOTE — Op Note (Signed)
NAMEJAYLIAH, [name]              ACCOUNT NO.:  [id number]  MEDICAL RECORD NO.:  [id number]  LOCATION:  1407                         FACILITY:  [facility]  PHYSICIAN:  Sebastian Ache, MD     DATE OF BIRTH:  [birth year]  DATE OF PROCEDURE: DATE OF DISCHARGE:                              OPERATIVE REPORT   DIAGNOSIS:  Right ureteral stone, urinary tract infection.  PROCEDURES: 1. Cystoscopy with right retrograde pyelogram with interpretation. 2. Insertion of right ureteral stent, no tether 6 x 26.  ESTIMATED BLOOD LOSS:  Nil.  SPECIMENS:  None.  FINDINGS: 1. Inflamed urinary bladder. 2. Minimal right hydroureteronephrosis. 3. Filling defect in distal right ureter consistent with a known stone. 4. Cloudy appearing urine around and through the distal end of the     stent after passage.  INDICATIONS:  [name] is a pleasant 41 year old female with long history of nephrolithiasis, recurrent urinary tract infections.  She had a distal right ureteral calculus diagnosed by CT approximately 1 week ago.  She has been managed with medical therapy.  However, she has not passed her stone, and her pain became refractory.  Also, for the last 24 hours, she began having subjective fevers, and they felt that this clinical scenario represented an obstructed stone and that urgent decompression was warranted with either nephrostomy tube versus stenting.  The patient was to proceed with a trial of stenting. Informed consent was obtained and placed in the medical record.  PROCEDURE IN DETAIL:  The patient being [name], verified; procedure being right ureteral stent placement was confirmed.  The procedure was carried out.  Time-out was performed.  Intravenous antibiotics administered.  General LMA anesthesia was introduced.  The patient was placed in a low lithotomy position.  Sterile field was created by prepping and draping the patient's vagina, introitus, proximal thighs using iodine x3.   Next, a cystourethroscopy was performed using a 22-French rigid cystoscope with 30 degree lens. Inspection of the urinary bladder revealed some erythema and inflammation. Ureteral orifices were in the normal anatomic position.  The right ureteral orifice was then gently cannulated using a 6-French angled catheter and right retrograde pyelogram was seen.  Right retrograde pyelogram demonstrated a single right ureter with single system of right kidney.  There was a minimal hydroureteronephrosis.  There was a filling defect in distal ureter consistent with known stone.  A 0.038 [name] wire was advanced at the level of the upper pole over which a new 6 x 24 double-J stent was placed. This did not appear to be a sufficient length as the proximal curl was forming in the proximal ureter as such it was grasped And exchanged for a new 6 x 26 stent over the [name] wire.  Using radiographic guidance, good proximal and distal curl were then noted.  Bladder was emptied per cystoscope.  Procedure was then terminated.  Notably, there was cloudy appearing urine around and throughthe distal end of the stent following placement.          ______________________________ Sebastian Ache, MD     TM/MEDQ  D:  11/12/2011  T:  11/13/2011  Job:  [id number]

## 2011-11-13 NOTE — Progress Notes (Signed)
1 Day Post-Op  Subjective:  1 - Rt Ureteral Stone - s/p stenting yesterday, flank pain improved, but now with stent colic.  2 - UTI - On empiric ABX for bacteruria and fevers, no fevers overnight.  Today [name] c/o some moderate stent colic, similar to prior stents. Mostly bothersome when voiding. Her flank pain is improved. Maintining PO intake  Objective: Vital signs in last 24 hours: Temp:  [97.7 F (36.5 C)-98.9 F (37.2 C)] 98.2 F (36.8 C) (08/11 0535) Pulse Rate:  [62-90] 62  (08/11 0535) Resp:  [15-20] 18  (08/11 0535) BP: (115-159)/(64-89) 116/73 mmHg (08/11 0535) SpO2:  [95 %-100 %] 96 % (08/11 0535) Weight:  [108.86 kg (239 lb 15.9 oz)-108.863 kg (240 lb)] 108.86 kg (239 lb 15.9 oz) (08/10 1618) Last BM Date: 11/11/11  Intake/Output from previous day: 08/10 0701 - 08/11 0700 In: 2810 [P.O.:660; I.V.:2100; IV Piggyback:50] Out: 3150 [Urine:3150] Intake/Output this shift:    General appearance: alert, cooperative and appears stated age Head: Normocephalic, without obvious abnormality, atraumatic Eyes: conjunctivae/corneas clear. PERRL, EOM's intact. Fundi benign. Neck: no adenopathy, no carotid bruit, no JVD, supple, symmetrical, trachea midline and thyroid not enlarged, symmetric, no tenderness/mass/nodules Cardio: regular rate and rhythm, S1, S2 normal, no murmur, click, rub or gallop GI: soft, non-tender; bowel sounds normal; no masses,  no organomegaly Extremities: extremities normal, atraumatic, no cyanosis or edema Neurologic: Grossly normal Incision/Wound: none, No SP or Flank TTP  Lab Results:   Basename 11/12/11 1240  WBC 8.4  HGB 13.5  HCT 39.6  PLT 234   BMET  Basename 11/12/11 1240  NA 137  K 3.7  CL 100  CO2 23  GLUCOSE 80  BUN 13  CREATININE 0.64  CALCIUM 9.1   PT/INR No results found for this basename: LABPROT:2,INR:2 in the last 72 hours ABG No results found for this basename: PHART:2,PCO2:2,PO2:2,HCO3:2 in the last 72  hours  Studies/Results: No results found.  Anti-infectives: Anti-infectives     Start     Dose/Rate Route Frequency Ordered Stop   11/13/11 1000   hydroxychloroquine (PLAQUENIL) tablet 400 mg        400 mg Oral Daily 11/12/11 1421     11/12/11 1600   piperacillin-tazobactam (ZOSYN) IVPB 3.375 g        3.375 g 12.5 mL/hr over 240 Minutes Intravenous Every 8 hours 11/12/11 1506            Assessment/Plan: 1 - Rt Ureteral Stone - s/p stenting, will plan for definitive ureteroscopy in 2 weeks. Changed Po meds to PO dilaudid.  2 - UTI - continue current abx, CX penidng.  3 - Dispo - possible Dc today as long as pain controlled on PO meds.   LOS: 1 day    [facility], Sharel Behne 11/13/2011

## 2011-11-14 ENCOUNTER — Encounter (HOSPITAL_COMMUNITY): Payer: Self-pay | Admitting: Urology

## 2011-11-14 MED ORDER — OMEPRAZOLE 40 MG PO CPDR: 40.0000 mg | DELAYED_RELEASE_CAPSULE | Freq: Every day | ORAL | Status: DC

## 2011-11-14 MED ORDER — CIPROFLOXACIN HCL 500 MG PO TABS: 500.0000 mg | ORAL_TABLET | Freq: Two times a day (BID) | ORAL | Status: AC

## 2011-11-14 MED ORDER — HYDROMORPHONE HCL 2 MG PO TABS: 2.0000 mg | ORAL_TABLET | ORAL | Status: AC | PRN

## 2011-11-14 MED ORDER — PHENAZOPYRIDINE HCL 100 MG PO TABS: 100.0000 mg | ORAL_TABLET | Freq: Three times a day (TID) | ORAL | Status: AC | PRN

## 2011-11-14 MED ORDER — ESTRADIOL 1 MG PO TABS: 1.5000 mg | ORAL_TABLET | Freq: Every day | ORAL | Status: DC

## 2011-11-14 MED ORDER — HYOSCYAMINE SULFATE 0.125 MG PO TABS: 0.1250 mg | ORAL_TABLET | ORAL | Status: DC | PRN

## 2011-11-14 MED ORDER — OXYBUTYNIN CHLORIDE ER 10 MG PO TB24: 10.0000 mg | ORAL_TABLET | Freq: Every day | ORAL | Status: DC

## 2011-11-14 NOTE — ED Provider Notes (Signed)
Medical screening examination/treatment/procedure(s) were conducted as a shared visit with non-physician practitioner(s) and myself.  I personally evaluated the patient during the encounter Pt with right flank pain. Recent ct w moderate sized right ureteral stone. Pain persists post med, urology called.   Suzi Roots, MD 11/14/11 [phone]

## 2011-11-14 NOTE — Progress Notes (Signed)
Urology Progress Note  Subjective:     No acute urologic events overnight. Has bladder spasms that are managed with PO medications. Final urine culture negative. Afebrile.    ROS: Negative: chest pain  Objective:  Patient Vitals for the past 24 hrs:  BP Temp Temp src Pulse Resp SpO2  11/14/11 0554 154/82 mmHg 97.8 F (36.6 C) Oral 74  20  95 %  11/13/11 2204 108/66 mmHg 98.1 F (36.7 C) Oral 64  18  96 %  11/13/11 1334 143/84 mmHg 97.6 F (36.4 C) Oral 70  18  100 %    Physical Exam: General:  No acute distress, awake Cardiovascular:    [x]   S1/S2 present, RRR  []   Irregularly irregular Chest:  CTA-B Abdomen:               []  Soft, appropriately TTP  [x]  Soft, NTTP  []  Soft, appropriately TTP, incision(s) clean/dry/intact  Genitourinary: No foley     I/O last 3 completed shifts: In: 3530 [P.O.:1380; I.V.:2100; IV Piggyback:50] Out: 4750 [Urine:4750]  Recent Labs  [facility] 11/12/11 1240   HGB 13.5   WBC 8.4   PLT 234    Recent Labs  Basename 11/12/11 1240   NA 137   K 3.7   CL 100   CO2 23   BUN 13   CREATININE 0.64   CALCIUM 9.1   GFRNONAA >90   GFRAA >90     No results found for this basename: PT:2,INR:2,APTT:2 in the last 72 hours   No components found with this basename: ABG:2    Length of stay: 2 days.  Assessment: Right ureter stone.POD#2 right ureter stent placement.  Plan: -Discharge home today. -Patient instructed to take 3 more days of the cipro she has at home. -Follow up with Dr. Berneice Heinrich for ureteroscopy.   Natalia Leatherwood, MD [phone]

## 2011-11-14 NOTE — Discharge Summary (Signed)
Physician Discharge Summary  Patient ID: [name] MRN: [id number] DOB/AGE: October 09, 1970 41 y.o.  Admit date: 11/12/2011 Discharge date: 11/14/2011  Admission Diagnoses: Right ureter stone  Discharge Diagnoses:  Right ureter stone  Discharged Condition: good  Hospital Course:  Patient was admitted following placement of right ureter stent for stone. There was concern about possible urine infection, but urine culture returned negative. Patient was able to control he pain with oral medications and did not have fevers.  Consults: None  Significant Diagnostic Studies: microbiology: urine culture: negative  Treatments: surgery: Right ureter stent placement.  Discharge Exam: Blood pressure 154/82, pulse 74, temperature 97.8 F (36.6 C), temperature source Oral, resp. rate 20, height 5\' 8"  (1.727 m), weight 108.86 kg (239 lb 15.9 oz), SpO2 95.00%. Refer to PE from date of discharge in progress note.  Disposition:   Discharge Orders    Future Appointments: Provider: Department: Dept Phone: Center:   12/21/2011 9:30 AM [facility] [phone]     Future Orders Please Complete By Expires   Discharge patient        Medication List  As of 11/14/2011  6:27 AM   TAKE these medications         ALIGN 4 MG Caps   Take 1 capsule by mouth daily.      aspirin 81 MG tablet   Take 81 mg by mouth daily.      cholecalciferol 1000 UNITS tablet   Commonly known as: VITAMIN D   Take 1,000 Units by mouth daily.      ciprofloxacin 500 MG tablet   Commonly known as: CIPRO   Take 1 tablet (500 mg total) by mouth 2 (two) times daily. You have this medication at home. Take it twice daily for 3 days.      estradiol 1 MG tablet   Commonly known as: ESTRACE   Take 1.5 mg by mouth daily.      gabapentin 300 MG capsule   Commonly known as: NEURONTIN   Take 300 mg by mouth 2 (two) times daily.      hydrochlorothiazide 12.5 MG capsule   Commonly known as: MICROZIDE   Take  12.5 mg by mouth daily.      HYDROmorphone 2 MG tablet   Commonly known as: DILAUDID   Take 1 tablet (2 mg total) by mouth every 3 (three) hours as needed for pain.      hydroxychloroquine 200 MG tablet   Commonly known as: PLAQUENIL   Take 400 mg by mouth daily.      hyoscyamine 0.125 MG tablet   Commonly known as: LEVSIN, ANASPAZ   Take 1 tablet (0.125 mg total) by mouth every 4 (four) hours as needed for cramping (bladder spasms).      levothyroxine 112 MCG tablet   Commonly known as: SYNTHROID, LEVOTHROID   Take 112 mcg by mouth daily.      lisinopril 20 MG tablet   Commonly known as: PRINIVIL,ZESTRIL   Take 20 mg by mouth daily.      meloxicam 7.5 MG tablet   Commonly known as: MOBIC   Take 7.5 mg by mouth 2 (two) times daily.      nystatin powder   Commonly known as: MYCOSTATIN   Apply topically 4 (four) times daily.      omeprazole 40 MG capsule   Commonly known as: PRILOSEC   Take 40 mg by mouth daily.      oxybutynin 10 MG 24 hr tablet  Commonly known as: DITROPAN-XL   Take 1 tablet (10 mg total) by mouth at bedtime.      phenazopyridine 100 MG tablet   Commonly known as: PYRIDIUM   Take 1 tablet (100 mg total) by mouth every 8 (eight) hours as needed for pain (Burning urination.  Will turn urine and body fluids orange.).      piperacillin-tazobactam 3-0.375 GM/50ML IVPB   Commonly known as: ZOSYN   Inject 50 mLs (3.375 g total) into the vein once.      ranitidine 150 MG capsule   Commonly known as: ZANTAC   Take 150 mg by mouth daily.           Follow-up Information    Follow up with Marga Melnick, MD. (As needed)    Contact information:   [phone] [address] [facility] [address] [place] [address] [phone]       Call Sebastian Ache, MD.   Contact information:   [address] [facility] [place] [address] [phone]          Signed: Milford Cage 11/14/2011, 6:27  AM

## 2011-12-06 ENCOUNTER — Encounter (HOSPITAL_BASED_OUTPATIENT_CLINIC_OR_DEPARTMENT_OTHER): Payer: Self-pay | Admitting: *Deleted

## 2011-12-06 ENCOUNTER — Other Ambulatory Visit: Payer: Self-pay | Admitting: Urology

## 2011-12-06 NOTE — Addendum Note (Signed)
Addended by: Sebastian Ache B on: 12/06/2011 02:26 PM   Modules accepted: Orders

## 2011-12-06 NOTE — Progress Notes (Signed)
NPO AFTER MN.  ARRIVES AT 0715. NEEDS ISTAT. CURRENT EKG IN CHART AND EPIC. WILL TAKE SYNTHROID , PRILOSEC, AND VIDOCIN AM OF SURG W/ SIP OF WATER.

## 2011-12-07 ENCOUNTER — Ambulatory Visit (HOSPITAL_BASED_OUTPATIENT_CLINIC_OR_DEPARTMENT_OTHER): Payer: Medicare Other | Admitting: Anesthesiology

## 2011-12-07 ENCOUNTER — Ambulatory Visit (HOSPITAL_BASED_OUTPATIENT_CLINIC_OR_DEPARTMENT_OTHER)
Admission: RE | Admit: 2011-12-07 | Discharge: 2011-12-07 | Disposition: A | Discharge status: Home / Self Care | Payer: Medicare Other | Source: Ambulatory Visit | Attending: Urology | Admitting: Urology

## 2011-12-07 ENCOUNTER — Encounter (HOSPITAL_BASED_OUTPATIENT_CLINIC_OR_DEPARTMENT_OTHER)
Admission: RE | Disposition: A | Discharge status: Home / Self Care | Payer: Self-pay | Source: Ambulatory Visit | Attending: Urology

## 2011-12-07 ENCOUNTER — Encounter (HOSPITAL_BASED_OUTPATIENT_CLINIC_OR_DEPARTMENT_OTHER): Payer: Self-pay | Admitting: Anesthesiology

## 2011-12-07 ENCOUNTER — Encounter (HOSPITAL_BASED_OUTPATIENT_CLINIC_OR_DEPARTMENT_OTHER): Payer: Self-pay | Admitting: *Deleted

## 2011-12-07 DIAGNOSIS — E039 Hypothyroidism, unspecified: Secondary | ICD-10-CM | POA: Insufficient documentation

## 2011-12-07 DIAGNOSIS — I10 Essential (primary) hypertension: Secondary | ICD-10-CM | POA: Insufficient documentation

## 2011-12-07 DIAGNOSIS — N201 Calculus of ureter: Secondary | ICD-10-CM | POA: Insufficient documentation

## 2011-12-07 DIAGNOSIS — M329 Systemic lupus erythematosus, unspecified: Secondary | ICD-10-CM | POA: Insufficient documentation

## 2011-12-07 DIAGNOSIS — K219 Gastro-esophageal reflux disease without esophagitis: Secondary | ICD-10-CM | POA: Insufficient documentation

## 2011-12-07 HISTORY — DX: Pain in unspecified joint: M25.50

## 2011-12-07 HISTORY — PX: CYSTOSCOPY W/ URETERAL STENT REMOVAL: SHX1430

## 2011-12-07 HISTORY — DX: Personal history of urinary calculi: Z87.442

## 2011-12-07 HISTORY — DX: Personal history of other diseases of the female genital tract: Z87.42

## 2011-12-07 HISTORY — DX: Nausea with vomiting, unspecified: R11.2

## 2011-12-07 HISTORY — DX: Inflammatory polyarthropathy: M06.4

## 2011-12-07 HISTORY — PX: CYSTOSCOPY/RETROGRADE/URETEROSCOPY/STONE EXTRACTION WITH BASKET: SHX5317

## 2011-12-07 HISTORY — DX: Myalgia, unspecified site: M79.10

## 2011-12-07 HISTORY — DX: Other chronic pain: G89.29

## 2011-12-07 HISTORY — DX: Other specified postprocedural states: Z98.890

## 2011-12-07 LAB — POCT I-STAT 4, (NA,K, GLUC, HGB,HCT)
HCT: 37 % (ref 36.0–46.0)
Hemoglobin: 12.6 g/dL (ref 12.0–15.0)

## 2011-12-07 SURGERY — REMOVAL, STENT, URETER, CYSTOSCOPIC
Anesthesia: General | Site: Ureter | Laterality: Right | Wound class: Clean Contaminated

## 2011-12-07 MED ORDER — PROMETHAZINE HCL 25 MG/ML IJ SOLN: 6.2500 mg | INTRAMUSCULAR | Status: DC | PRN

## 2011-12-07 MED ORDER — KETOROLAC TROMETHAMINE 30 MG/ML IJ SOLN
INTRAMUSCULAR | Status: DC | PRN
  Administered 2011-12-07: 30 mg via INTRAVENOUS

## 2011-12-07 MED ORDER — LACTATED RINGERS IV SOLN: INTRAVENOUS | Status: DC

## 2011-12-07 MED ORDER — LIDOCAINE HCL (CARDIAC) 20 MG/ML IV SOLN
INTRAVENOUS | Status: DC | PRN
  Administered 2011-12-07: 75 mg via INTRAVENOUS

## 2011-12-07 MED ORDER — HYDROMORPHONE HCL PF 1 MG/ML IJ SOLN
0.2500 mg | INTRAMUSCULAR | Status: DC | PRN
  Administered 2011-12-07: 0.25 mg via INTRAVENOUS
  Administered 2011-12-07: 0.5 mg via INTRAVENOUS

## 2011-12-07 MED ORDER — IOHEXOL 300 MG/ML  SOLN
INTRAMUSCULAR | Status: DC | PRN
  Administered 2011-12-07: 50 mL via INTRAVENOUS

## 2011-12-07 MED ORDER — MIDAZOLAM HCL 5 MG/5ML IJ SOLN
INTRAMUSCULAR | Status: DC | PRN
  Administered 2011-12-07 (×3): 0.5 mg via INTRAVENOUS

## 2011-12-07 MED ORDER — FENTANYL CITRATE 0.05 MG/ML IJ SOLN
INTRAMUSCULAR | Status: DC | PRN
  Administered 2011-12-07: 25 ug via INTRAVENOUS
  Administered 2011-12-07: 50 ug via INTRAVENOUS
  Administered 2011-12-07 (×5): 25 ug via INTRAVENOUS

## 2011-12-07 MED ORDER — ONDANSETRON HCL 4 MG/2ML IJ SOLN
INTRAMUSCULAR | Status: DC | PRN
  Administered 2011-12-07: 4 mg via INTRAVENOUS

## 2011-12-07 MED ORDER — PROPOFOL 10 MG/ML IV BOLUS
INTRAVENOUS | Status: DC | PRN
  Administered 2011-12-07: 250 mg via INTRAVENOUS

## 2011-12-07 MED ORDER — LACTATED RINGERS IV SOLN
INTRAVENOUS | Status: DC
  Administered 2011-12-07 (×2): via INTRAVENOUS
  Administered 2011-12-07: 100 mL/h via INTRAVENOUS

## 2011-12-07 MED ORDER — TRAMADOL HCL 50 MG PO TABS: 50.0000 mg | ORAL_TABLET | Freq: Four times a day (QID) | ORAL | Status: DC | PRN

## 2011-12-07 MED ORDER — DEXAMETHASONE SODIUM PHOSPHATE 4 MG/ML IJ SOLN
INTRAMUSCULAR | Status: DC | PRN
  Administered 2011-12-07: 10 mg via INTRAVENOUS

## 2011-12-07 MED ORDER — SODIUM CHLORIDE 0.9 % IR SOLN
Status: DC | PRN
  Administered 2011-12-07: 2

## 2011-12-07 MED ORDER — GENTAMICIN IN SALINE 1.6-0.9 MG/ML-% IV SOLN
80.0000 mg | INTRAVENOUS | Status: AC
  Administered 2011-12-07: 80 mg via INTRAVENOUS

## 2011-12-07 SURGICAL SUPPLY — 21 items
ADAPTER CATH URET PLST 4-6FR (CATHETERS) ×3 IMPLANT
ADPR CATH URET STRL DISP 4-6FR (CATHETERS) ×2
BAG URO CATCHER STRL LF (DRAPE) ×3 IMPLANT
BASKET LASER NITINOL 1.9FR (BASKET) ×2 IMPLANT
BSKT STON RTRVL 120 1.9FR (BASKET) ×2
CANISTER SUCT LVC 12 LTR MEDI- (MISCELLANEOUS) ×2 IMPLANT
CATH FOLEY 2WAY  3CC  8FR (CATHETERS) ×1
CATH FOLEY 2WAY 3CC 8FR (CATHETERS) ×1 IMPLANT
CATH INTERMIT  6FR 70CM (CATHETERS) ×2 IMPLANT
CLOTH BEACON ORANGE TIMEOUT ST (SAFETY) ×3 IMPLANT
DRAPE CAMERA CLOSED 9X96 (DRAPES) ×3 IMPLANT
GLOVE BIO SURGEON STRL SZ7 (GLOVE) ×3 IMPLANT
GLOVE BIOGEL PI IND STRL 6.5 (GLOVE) ×1 IMPLANT
GLOVE BIOGEL PI INDICATOR 6.5 (GLOVE) ×1
GOWN PREVENTION PLUS XLARGE (GOWN DISPOSABLE) ×3 IMPLANT
GOWN STRL NON-REIN LRG LVL3 (GOWN DISPOSABLE) ×6 IMPLANT
GUIDEWIRE ANG ZIPWIRE 038X150 (WIRE) ×2 IMPLANT
GUIDEWIRE STR DUAL SENSOR (WIRE) ×3 IMPLANT
IV NS IRRIG 3000ML ARTHROMATIC (IV SOLUTION) ×5 IMPLANT
PACK CYSTOSCOPY (CUSTOM PROCEDURE TRAY) ×3 IMPLANT
SUT CHROMIC 4 0 P 3 18 (SUTURE) ×2 IMPLANT

## 2011-12-07 NOTE — Anesthesia Preprocedure Evaluation (Signed)
Anesthesia Evaluation  Patient identified by MRN, date of birth, ID band Patient awake    Reviewed: Allergy & Precautions, H&P , NPO status , Patient's Chart, lab work & pertinent test results  History of Anesthesia Complications (+) PONV  Airway Mallampati: II TM Distance: <3 FB Neck ROM: Full    Dental No notable dental hx. (+) Teeth Intact and Dental Advisory Given   Pulmonary neg pulmonary ROS,  breath sounds clear to auscultation  Pulmonary exam normal       Cardiovascular hypertension, Pt. on medications Rhythm:Regular Rate:Normal     Neuro/Psych  Headaches, Anxiety Depression  Neuromuscular disease negative neurological ROS  negative psych ROS   GI/Hepatic Neg liver ROS, GERD-  Medicated,Gastroenteritis   Endo/Other  Hypothyroidism Morbid obesity  Renal/GU negative Renal ROS  negative genitourinary   Musculoskeletal negative musculoskeletal ROS (+)   Abdominal   Peds negative pediatric ROS (+)  Hematology negative hematology ROS (+)   Anesthesia Other Findings   Reproductive/Obstetrics negative OB ROS Polycystic ovarian disease                           Anesthesia Physical Anesthesia Plan  ASA: II  Anesthesia Plan: General   Post-op Pain Management:    Induction: Intravenous  Airway Management Planned: LMA  Additional Equipment:   Intra-op Plan:   Post-operative Plan:   Informed Consent: I have reviewed the patients History and Physical, chart, labs and discussed the procedure including the risks, benefits and alternatives for the proposed anesthesia with the patient or authorized representative who has indicated his/her understanding and acceptance.   Dental advisory given  Plan Discussed with: CRNA  Anesthesia Plan Comments:         Anesthesia Quick Evaluation

## 2011-12-07 NOTE — Anesthesia Postprocedure Evaluation (Signed)
Anesthesia Post Note  Patient: [name]  Procedure(s) Performed: Procedure(s) (LRB): CYSTOSCOPY WITH STENT REMOVAL (Right) CYSTOSCOPY/RETROGRADE/URETEROSCOPY/STONE EXTRACTION WITH BASKET (Right)  Anesthesia type: General  Patient location: PACU  Post pain: Pain level controlled  Post assessment: Post-op Vital signs reviewed  Last Vitals:  Filed Vitals:   12/07/11 0910  BP: 150/86  Pulse:   Temp: 36.4 C  Resp: 13    Post vital signs: Reviewed  Level of consciousness: sedated  Complications: No apparent anesthesia complications

## 2011-12-07 NOTE — Transfer of Care (Signed)
Immediate Anesthesia Transfer of Care Note  Patient: [name]  Procedure(s) Performed: Procedure(s) (LRB): CYSTOSCOPY WITH STENT REMOVAL (Right) CYSTOSCOPY/RETROGRADE/URETEROSCOPY/STONE EXTRACTION WITH BASKET (Right)  Patient Location: Patient transported to PACU with oxygen via face mask at 4 Liters / Min  Anesthesia Type: General  Level of Consciousness: awake and alert   Airway & Oxygen Therapy: Patient Spontanous Breathing and Patient connected to face mask oxygen  Post-op Assessment: Report given to PACU RN and Post -op Vital signs reviewed and stable  Post vital signs: Reviewed and stable  Dentition: Teeth and oropharynx remain in pre-op condition  Complications: No apparent anesthesia complications

## 2011-12-07 NOTE — Brief Op Note (Signed)
12/07/2011  9:08 AM  PATIENT:  [name]  41 y.o. female  PRE-OPERATIVE DIAGNOSIS:  RIGHT URETERAL STONE  POST-OPERATIVE DIAGNOSIS:  RIGHT URETERAL STONE  PROCEDURE:  Procedure(s) (LRB) with comments: CYSTOSCOPY WITH STENT REMOVAL (Right) CYSTOSCOPY/RETROGRADE/URETEROSCOPY/STONE EXTRACTION WITH BASKET (Right)  SURGEON:  Surgeon(s) and Role:    * Sebastian Ache, MD - Primary  PHYSICIAN ASSISTANT:   ASSISTANTS: none   ANESTHESIA:   general  EBL:  Total I/O In: 900 [I.V.:900] Out: -   BLOOD ADMINISTERED:none  DRAINS: none   LOCAL MEDICATIONS USED:  NONE  SPECIMEN:  Source of Specimen:  Rt ureter stone  DISPOSITION OF SPECIMEN:  compositional analysis  COUNTS:  YES  TOURNIQUET:  * No tourniquets in log *  DICTATION: .Other Dictation: Dictation Number [id number]  PLAN OF CARE: Discharge to home after PACU  PATIENT DISPOSITION:  PACU - hemodynamically stable.   Delay start of Pharmacological VTE agent (>24hrs) due to surgical blood loss or risk of bleeding: no

## 2011-12-07 NOTE — Anesthesia Procedure Notes (Signed)
Procedure Name: LMA Insertion Date/Time: 12/07/2011 8:33 AM Performed by: Fran Lowes Pre-anesthesia Checklist: Patient identified, Emergency Drugs available, Suction available and Patient being monitored Patient Re-evaluated:Patient Re-evaluated prior to inductionOxygen Delivery Method: Circle System Utilized Preoxygenation: Pre-oxygenation with 100% oxygen Intubation Type: IV induction Ventilation: Mask ventilation without difficulty LMA: LMA inserted LMA Size: 4.0 Number of attempts: 1 Airway Equipment and Method: bite block Placement Confirmation: positive ETCO2 Tube secured with: Tape Dental Injury: Teeth and Oropharynx as per pre-operative assessment

## 2011-12-07 NOTE — H&P (Signed)
[name] is an 41 y.o. female.   Chief Complaint: pre-op Ureteroscopy HPI: 1 - Rt Ureteral stone - s/p RT cysto-stent 8/10 for distal stone + possible infection. Final UCX negative. Discussed definitive options of continued medical therapy, SWL, ureteroscopy and pt wants to proceed with ureteroscopy today. She is long-time stone former and interested in metabolic eval and stone prevention strategies going forward.  No CV disease, No blood thinners.  Past Medical History  Diagnosis Date  . Hypothyroidism   . Hypertension     a  . Anxiety   . Depression     during divorce & legal matters  . Osteoarthritis   . GERD (gastroesophageal reflux disease)   . IBS (irritable bowel syndrome)   . Lupus hx positive ana's    followed by dr Zenovia Jordan  . History of polycystic ovarian disease S/P BSO  . PONV (postoperative nausea and vomiting)   . Polyarthritis, inflammatory   . Chronic joint pain   . Myalgia   . Right ureteral stone   . History of kidney stones     Past Surgical History  Procedure Date  . Eye surgery     Retinal tears surgery bilateral  . Tonsillectomy   . Carpal tunnel release     bilateral  . Plantar fascia surgery     right  . Plantar fascia surgery 02/2011    left  . Cystoscopy w/ ureteral stent placement 11/12/2011    Procedure: CYSTOSCOPY WITH RETROGRADE PYELOGRAM/URETERAL STENT PLACEMENT;  Surgeon: Sebastian Ache, MD;  Location: [facility];  Service: Urology;  Laterality: Right;  . Laparoscopic cholecystectomy 05-31-2007  . Cysto/ right retrograde ureteral pyelogram 07-09-2004    HX BILATERAL RENAL STONES/ RIGHT FLANK PAIN  . Excision left bartholin gland 02-05-2002  . Laparoscopy with right salpingo-oophectomy/ lysis adhesions and ablation endometriosis 05-17-2001  . Right ureteroscopic stone extraction 08-04-2000  . Laparoscopic laser ablation endometriosis and left salpingo-oophorectomy 11-03-1999  . Laparoscopic assisted vaginal hysterectomy 2000  .  Dilation and curettage of uterus     X5  . Ankle surgery     X3  . Appendectomy 1991    exploratory lap  . Lumbar laminectomy 2003    L4 - L5    Family History  Problem Relation Age of Onset  . Hypertension Mother   . Colon polyps Mother   . Atrial fibrillation Mother   . Heart disease Father   . Heart attack Father   . Diabetes Father   . Hypertension Sister   . Heart attack Paternal Grandmother   . Breast cancer Paternal Aunt   . Colon cancer Maternal Uncle   . Colon cancer Paternal Uncle   . Colon polyps Maternal Grandmother   . Stroke Maternal Grandmother   . Diabetes Paternal Grandfather   . Stroke Maternal Grandfather    Social History:  reports that she has never smoked. She has never used smokeless tobacco. She reports that she does not drink alcohol or use illicit drugs.  Allergies:  Allergies  Allergen Reactions  . Nalbuphine Shortness Of Breath and Rash    Nubain caused respiratory distress & rash  . Sulfamethoxazole W-Trimethoprim Rash    rash  . Ivp Dye (Iodinated Diagnostic Agents) Rash and Other (See Comments)    flushing  . Tylox (Oxycodone-Acetaminophen) Rash    No prescriptions prior to admission    No results found for this or any previous visit (from the past 48 hour(s)). No results found.  Review of Systems  Constitutional: Negative.   HENT: Negative.   Eyes: Negative.   Respiratory: Negative.   Cardiovascular: Negative.   Gastrointestinal: Negative.   Genitourinary: Positive for urgency, frequency and flank pain. Negative for hematuria.  Skin: Negative.   Neurological: Negative.   Endo/Heme/Allergies: Negative.   Psychiatric/Behavioral: Negative.     Height 5\' 8"  (1.727 m), weight 107.956 kg (238 lb). Physical Exam  Constitutional: [name].  HENT:  Head: Normocephalic.  Eyes: Pupils are equal, round, and reactive to light.       glasses  Neck: Normal range of motion. Neck supple.    Cardiovascular: Normal rate.   Respiratory: Effort normal and breath sounds normal.  GI: Soft. Bowel sounds are normal.  Genitourinary:       Mild Rt CVAT  Musculoskeletal: Normal range of motion.  Neurological: She is alert.  Skin: Skin is warm and dry.  Psychiatric: She has a normal mood and affect. Her behavior is normal. Judgment and thought content normal.     Assessment/Plan 1 - Rt Ureteral stone  - Proceed with Rt URS. Risks including bleeding, infection, damage to kidney / ureter / bladder, need for staged procedures once again reinforced.  Arlinda Barcelona 12/07/2011, 6:32 AM

## 2011-12-08 ENCOUNTER — Encounter (HOSPITAL_BASED_OUTPATIENT_CLINIC_OR_DEPARTMENT_OTHER): Payer: Self-pay | Admitting: Urology

## 2011-12-08 NOTE — Op Note (Signed)
NAME:  [name], [name]              ACCOUNT NO.:  [id number]  MEDICAL RECORD NO.:  [id number]  LOCATION:                               FACILITY:  [facility]  PHYSICIAN:  Sebastian Ache, MD     DATE OF BIRTH:  [birth year]  DATE OF PROCEDURE:  12/07/2011 DATE OF DISCHARGE:                              OPERATIVE REPORT   PREOPERATIVE DIAGNOSIS:  Right ureteral stone.  PROCEDURE: 1. Right ureteroscopic stone manipulation with basket extraction. 2. Right retrograde pyelogram, interpretation. 3. Right ureteral stent removal.  FINDINGS: 1. Right distal ureteral stone. 2. No additional calcifications in the right kidney, ureter, urinary     bladder.  ASSESSMENT:  Right ureteral stone for compositional analysis.  ESTIMATED BLOOD LOSS:  Nil.  DRAINS:  None.  INDICATION:  [name] is a pleasant 41 year old female with a history of chronic nephrolithiasis, status post multiple stone episodes.  She recently presented in early August with the right flank pain, ureteral stone, and worry for concomitant infection and so she was temporized with ureteral stenting.  Her urine cultures finalized being negative and definitive options were discussed with the patient including lithotripsy versus ureteroscopic stone manipulation as she had not yet passed the stone and she wished to proceed with the latter.  Informed consent was obtained and placed in the medical record.  PROCEDURE IN DETAIL:  The patient being [name], verified; procedure being right ureteroscopic stone manipulation was confirmed. The procedure was carried out.  Time-out was performed.  Intravenous antibiotics administered.  General LMA anesthesia was introduced.  The patient was placed into a low lithotomy position.  Sterile field was created by prepping and draping the patient's vagina, introitus, and proximal thighs using iodine x3.  Next, a cystourethroscopy was performed using a 22-French rigid cystoscope with 12-degree  offset lens. Inspection of urinary bladder revealed no diverticula, calcifications, papillary lesions.  The distal end of right ureteral stent was seen in situ.  Proximal level urethral meatus towards a 0.038 [name] wire was advanced to the level of the right renal pelvis.  The stent was then exchanged for an angled catheter and along right retro pyelogram.  Right retro pyelogram revealed a single right ureter with single system right kidney.  There is filling defect in the distal ureter consistent with known stone.  No additional calcifications or filling defects were noted.  [name] wire was replaced and set aside as a safety wire.  The cystoscope was then exchanged for the semi-rigid ureteroscope and using normal saline irrigation under pressure.  Similar rigid ureteroscopy was performed at the distal third of the ureter.  Notably a 8-French Foley catheter was placed alongside this in to the low urinary bladder for pressure release.  Insertion of this into the ureter revealed a small calcification at the level of the iliac crossing, this was grasped with an Escape type basket and brought out in its entirety and set aside for compositional analysis.  Semi-rigid ureteroscopy of the remainder of the ureter revealed no additional calcifications or mucosal abnormalities. Second angled glide wire advanced as a working wire and a similar rigid ureteroscope was exchanged for the 8-French digital flexible ureteroscope.  This allowed systematic inspection  of all calices.  In the kidney, additional calcifications were found.  Final repeat ureteroscopy of the entire length of the ureter revealed no mucosal abnormalities.  The remaining safety wire was removed.  The pressure release catheter was removed.  Procedure then terminated.  The patient tolerated the procedure well with no immediate complications.  The patient was taken to postanesthesia care in excellent condition.           ______________________________ Sebastian Ache, MD     TM/MEDQ  D:  12/07/2011  T:  12/08/2011  Job:  [id number]

## 2011-12-13 ENCOUNTER — Encounter (HOSPITAL_BASED_OUTPATIENT_CLINIC_OR_DEPARTMENT_OTHER): Payer: Self-pay

## 2011-12-14 ENCOUNTER — Ambulatory Visit (INDEPENDENT_AMBULATORY_CARE_PROVIDER_SITE_OTHER): Payer: Medicare Other | Admitting: Family Medicine

## 2011-12-14 ENCOUNTER — Encounter: Payer: Self-pay | Admitting: Family Medicine

## 2011-12-14 VITALS — BP 136/78 | HR 92 | Temp 98.8°F | Ht 67.0 in | Wt 241.1 lb

## 2011-12-14 DIAGNOSIS — K589 Irritable bowel syndrome without diarrhea: Secondary | ICD-10-CM | POA: Insufficient documentation

## 2011-12-14 MED ORDER — DICYCLOMINE HCL 20 MG PO TABS: 20.0000 mg | ORAL_TABLET | Freq: Four times a day (QID) | ORAL | Status: DC

## 2011-12-14 NOTE — Patient Instructions (Addendum)
Start the [employer] daily Use the Bentyl as needed for abdominal spasm/cramping Drink plenty of fluids Eat yogurt (if you are able) daily Call with any questions or concerns Give your gut time to get right after all it's been through!!! Hang in there!

## 2011-12-14 NOTE — Progress Notes (Signed)
  Subjective:    Patient ID: [name], female    DOB: 05/21/1970, 41 y.o.   MRN: [id number]  HPI Had surgery 1 week ago to remove kidney stone and stent.  1 month ago was started on Cipro, was admitted 4 days later on IV abx.  Was d/c'd home on oral abx.  Finished last Tuesday.  By Wed night had 'severe stomach cramps'.  Was told on Thursday that her sxs were likely anesthesia related.  Given phenergan- 'knocked me out'.  Called back on Friday b/c pain was no better.  Was told that it was due to abx use.  Hx of IBS, autoimmune dz.  Told to see PCP.  No fevers.  Pain has improved today.  Pain is worse w/ eating, having loose stools after eating.  + dry heaves, no vomiting.  Isn't sure if she's taking Levsin currently.  Taking glycopyrrolate.  Was previously on Align, not currently due to cost.   Review of Systems For ROS see HPI     Objective:   Physical Exam  Vitals reviewed. Constitutional: [name]. No distress.  HENT:       MMM  Abdominal: Soft. Bowel sounds are normal. She exhibits no distension and no mass. There is no tenderness. There is no rebound and no guarding.          Assessment & Plan:

## 2011-12-14 NOTE — Assessment & Plan Note (Signed)
New to provider.  Ongoing for pt- deteriorated recently due to surgery and abx use.  Start bentyl.  Add Align to improve normal gut flora.  Reviewed supportive care and red flags that should prompt return.  Pt expressed understanding and is in agreement w/ plan.

## 2011-12-21 ENCOUNTER — Ambulatory Visit (HOSPITAL_COMMUNITY)
Admission: RE | Admit: 2011-12-21 | Discharge: 2011-12-21 | Disposition: A | Discharge status: Home / Self Care | Payer: Medicare Other | Source: Ambulatory Visit | Attending: Family Medicine | Admitting: Family Medicine

## 2011-12-21 DIAGNOSIS — Z1231 Encounter for screening mammogram for malignant neoplasm of breast: Secondary | ICD-10-CM | POA: Insufficient documentation

## 2011-12-28 ENCOUNTER — Other Ambulatory Visit: Payer: Self-pay | Admitting: *Deleted

## 2011-12-28 ENCOUNTER — Other Ambulatory Visit: Payer: Self-pay

## 2011-12-28 MED ORDER — LEVOTHYROXINE SODIUM 112 MCG PO TABS: 112.0000 ug | ORAL_TABLET | Freq: Every morning | ORAL | Status: DC

## 2011-12-28 MED ORDER — GLYCOPYRROLATE 2 MG PO TABS: 2.0000 mg | ORAL_TABLET | Freq: Two times a day (BID) | ORAL | Status: DC

## 2011-12-28 NOTE — Telephone Encounter (Signed)
R'cd fax from [facility] for refill of Levothyroxine.

## 2011-12-29 ENCOUNTER — Telehealth: Payer: Self-pay | Admitting: Internal Medicine

## 2011-12-29 MED ORDER — ESTRADIOL 1 MG PO TABS: 1.5000 mg | ORAL_TABLET | Freq: Every day | ORAL | Status: DC

## 2011-12-29 NOTE — Telephone Encounter (Signed)
Rx sent 

## 2011-12-29 NOTE — Telephone Encounter (Signed)
Refill: Estradiol 1 mg. 1 and 1/2 po qam. 90 day supply

## 2012-01-03 ENCOUNTER — Other Ambulatory Visit: Payer: Self-pay

## 2012-01-03 MED ORDER — OMEPRAZOLE 40 MG PO CPDR: 40.0000 mg | DELAYED_RELEASE_CAPSULE | Freq: Every morning | ORAL | Status: DC

## 2012-01-06 ENCOUNTER — Telehealth: Payer: Self-pay | Admitting: Internal Medicine

## 2012-01-06 NOTE — Telephone Encounter (Signed)
Left message on voicemail for patient to return call when available, ? If patient would like for me to mail her a copy of results

## 2012-01-06 NOTE — Telephone Encounter (Signed)
Negative report; recheck 1 year

## 2012-01-06 NOTE — Telephone Encounter (Signed)
Caller: Christian/Patient; Patient Name: [name]; PCP: Sheliah Hatch.; Best Callback Phone Number: [phone] Had Mammogram on 12/21/2011, was told she would be getting letter but it has not arrived yet.  Asking if results are available.     est results reported in [facility], signed off by Dr Beverely Low on 9/19 showing negative results, screening mammogram  to be repeated in 1 year, will be getting letter -- results given verbally to patient.

## 2012-01-10 ENCOUNTER — Other Ambulatory Visit: Payer: Self-pay | Admitting: Cardiovascular Disease

## 2012-01-11 ENCOUNTER — Other Ambulatory Visit: Payer: Self-pay | Admitting: *Deleted

## 2012-01-11 MED ORDER — LISINOPRIL 40 MG PO TABS: 40.0000 mg | ORAL_TABLET | Freq: Every day | ORAL | Status: DC

## 2012-01-11 MED ORDER — HYDROCHLOROTHIAZIDE 12.5 MG PO CAPS: 12.5000 mg | ORAL_CAPSULE | Freq: Every day | ORAL | Status: DC

## 2012-01-11 NOTE — Telephone Encounter (Signed)
Refilled Lisinopril and hydrochlorothiazide.

## 2012-03-19 ENCOUNTER — Telehealth: Payer: Self-pay | Admitting: Endocrinology

## 2012-03-19 ENCOUNTER — Telehealth: Payer: Self-pay | Admitting: Cardiovascular Disease

## 2012-03-19 MED ORDER — LISINOPRIL 40 MG PO TABS: 40.0000 mg | ORAL_TABLET | Freq: Every day | ORAL | Status: DC

## 2012-03-19 MED ORDER — LEVOTHYROXINE SODIUM 112 MCG PO TABS: 112.0000 ug | ORAL_TABLET | Freq: Every morning | ORAL | Status: DC

## 2012-03-19 MED ORDER — HYDROCHLOROTHIAZIDE 12.5 MG PO CAPS: 12.5000 mg | ORAL_CAPSULE | Freq: Every day | ORAL | Status: DC

## 2012-03-19 NOTE — Telephone Encounter (Signed)
Called pt and LVM informing her I had sent her Rx request for lisinopril 40 mg - 1 qd - QTY: 90 with 1 refill and HCTZ 12.5 mg - 1 qd QTY: 90 with 1 refill [facility] to [facility].  Caralee Ates, CMA

## 2012-03-19 NOTE — Telephone Encounter (Signed)
New problem:  lisinopril  40 mg,  HTCZ  12.5 mg.   90 days supply [facility].

## 2012-03-23 ENCOUNTER — Other Ambulatory Visit: Payer: Self-pay | Admitting: *Deleted

## 2012-03-23 MED ORDER — LEVOTHYROXINE SODIUM 112 MCG PO TABS: 112.0000 ug | ORAL_TABLET | Freq: Every morning | ORAL | Status: DC

## 2012-03-23 NOTE — Telephone Encounter (Signed)
Refill on synthroid sent to [facility] . Patient needs follow up appt. With Dr. Everardo All.

## 2012-03-30 ENCOUNTER — Telehealth: Payer: Self-pay | Admitting: Internal Medicine

## 2012-03-30 ENCOUNTER — Ambulatory Visit (INDEPENDENT_AMBULATORY_CARE_PROVIDER_SITE_OTHER): Payer: Medicare Other | Admitting: Family Medicine

## 2012-03-30 ENCOUNTER — Ambulatory Visit
Admission: RE | Admit: 2012-03-30 | Discharge: 2012-03-30 | Disposition: A | Discharge status: Home / Self Care | Payer: Medicare Other | Source: Ambulatory Visit | Attending: Family Medicine | Admitting: Family Medicine

## 2012-03-30 VITALS — BP 120/78 | HR 72 | Temp 98.7°F | Wt 237.0 lb

## 2012-03-30 DIAGNOSIS — M79659 Pain in unspecified thigh: Secondary | ICD-10-CM

## 2012-03-30 DIAGNOSIS — Z8619 Personal history of other infectious and parasitic diseases: Secondary | ICD-10-CM | POA: Insufficient documentation

## 2012-03-30 DIAGNOSIS — M79609 Pain in unspecified limb: Secondary | ICD-10-CM

## 2012-03-30 MED ORDER — TRAMADOL HCL 50 MG PO TABS: 50.0000 mg | ORAL_TABLET | Freq: Four times a day (QID) | ORAL | Status: AC | PRN

## 2012-03-30 NOTE — Telephone Encounter (Signed)
Patient Information:  Caller Name: [place]  Phone: [phone]  Patient: [name], [name]  Gender: Female  DOB: 01-15-1971  Age: 41 Years  PCP: Marga Melnick  Pregnant: No  Office Follow Up:  Does the office need to follow up with this patient?: No  Instructions For The Office: N/A  RN Note:  Hx: Hysterectomy. Patient has history of Lupus, connective tissue disease, inflammatory and osteoarthritis. Patient states she was exposed to confirmed case of shingles on 03/28/12. Patient states she was in close contact with the skin lesions. Patient is asymptomatic at present. Patient has not received the shingles vaccine. Patient also states she developed head congestion. Onset 03/25/12. States had a temp. of 100 orally 03/29/12. States afebrile 03/30/12.States feels pressure in her face with leaning her head forward. Patient has been using neti pot with temporary relief. Patient also states she developed right groin pain, onset 03/25/12. States she can feel a "knot" approx. the size of a quarter. States no redness noted of skin. No abscess or pustule. States pain increases with walking.  Symptoms  Reason For Call & Symptoms: exposed to shingles  Reviewed Health History In EMR: Yes  Reviewed Medications In EMR: Yes  Reviewed Allergies In EMR: Yes  Reviewed Surgeries / Procedures: Yes  Date of Onset of Symptoms: 03/28/2012 OB / GYN:  LMP: Unknown  Guideline(s) Used:  Sinus Pain and Congestion  Lymph Nodes - Swollen  Disposition Per Guideline:   See Today or Tomorrow in Office  Reason For Disposition Reached:   Patient wants to be seen  Advice Given:  For a Runny Nose With Profuse Discharge:  Nasal mucus and discharge helps to wash viruses and bacteria out of the nose and sinuses.  For a Stuffy Nose - Use Nasal Washes:  Introduction: Saline (salt water) nasal irrigation (nasal wash) is an effective and simple home remedy for treating stuffy nose and sinus congestion. The nose can  be irrigated by pouring, spraying, or squirting salt water into the nose and then letting it run back out.  How it Helps: The salt water rinses out excess mucus, washes out any irritants (dust, allergens) that might be present, and moistens the nasal cavity.  Methods: There are several ways to perform nasal irrigation. You can use a saline nasal spray bottle (available over-the-counter), a rubber ear syringe, a medical syringe without the needle, or a Neti Pot.  Neti Pot  The EchoStar is a Oceanographer or plastic pot with a narrow spout. It looks like a small tea pot. Two manufacturers of the EchoStar are the  [employer] in [place] and  [employer] in [place].  How it Helps: The EchoStar performs nasal washing (also called nasal irrigation or "jala neti"). The salt water rinses out excess mucus, washes out any irritants (dust, allergens) that might be present, and moisturizes the nasal cavity.  Call Back If:  Overlying skin becomes red  Fever more than 103 F (39.4 C) occurs  You become worse or are worried about a lymph node.  Appointment Scheduled:  03/30/2012 13:15:00 Appointment Scheduled Provider:  Sheliah Hatch.

## 2012-03-30 NOTE — Assessment & Plan Note (Signed)
New.  Pt w/ palpable cord in R upper, medial thigh.  No redness or swelling but due to tenderness must r/o DVT.  Doppler ordered.  If no DVT suspect this is tendonitis of hip flexor/sartorius.  Continue celebrex, tylenol- add ultram.  Reviewed supportive care and red flags that should prompt return.  Pt expressed understanding and is in agreement w/ plan.

## 2012-03-30 NOTE — Patient Instructions (Addendum)
We'll notify you of your [place] results and determine the next steps Continue to heat Take the Celebrex daily, continue the tylenol arthritis Call with any questions or concerns Happy New Year!

## 2012-03-30 NOTE — Progress Notes (Signed)
  Subjective:    Patient ID: [name], female    DOB: 08-Jul-1970, 41 y.o.   MRN: [id number]  HPI Shingles exposure- was w/ cousin on Christmas and yesterday cousin was dx'd w/ shingles.  Pt concerned about this.  R thigh pain- pain in medial thigh, radiating to knee.  Hx of back surgery on L4/5, hx of bulging S1.  Pain started Sunday.  Pain improves w/ elevation, worse w/ hanging down, prolonged walking or changing from seated to standing position.  No relief w/ tylenol arthritis, heating pad, hot soaks.  Pt reports increased physical activity recently.   Review of Systems For ROS see HPI     Objective:   Physical Exam  Vitals reviewed. Constitutional: [name]. No distress.  Musculoskeletal: She exhibits tenderness (over R upper medial thigh/groin w/ palpable cord). She exhibits no edema.       No pain w/ hip flexion Some pain at site of cord w/ internal/external rotation of hip  Neurological: She has normal reflexes. Coordination normal.  Skin: Skin is warm and dry.  Psychiatric: She has a normal mood and affect. Her behavior is normal.          Assessment & Plan:

## 2012-03-30 NOTE — Assessment & Plan Note (Signed)
New.  Reviewed w/ pt that she is unable to contract shingles from another person and that it is her own hx of virus that puts her at risk for reactivation.  Pt expressed understanding.

## 2012-05-25 ENCOUNTER — Ambulatory Visit (INDEPENDENT_AMBULATORY_CARE_PROVIDER_SITE_OTHER): Payer: Medicare Other | Admitting: Internal Medicine

## 2012-05-25 ENCOUNTER — Encounter: Payer: Self-pay | Admitting: Internal Medicine

## 2012-05-25 ENCOUNTER — Ambulatory Visit (INDEPENDENT_AMBULATORY_CARE_PROVIDER_SITE_OTHER)
Admission: RE | Admit: 2012-05-25 | Discharge: 2012-05-25 | Disposition: A | Discharge status: Home / Self Care | Payer: Medicare Other | Source: Ambulatory Visit | Attending: Internal Medicine | Admitting: Internal Medicine

## 2012-05-25 VITALS — BP 118/80 | HR 73 | Temp 98.6°F | Wt 235.0 lb

## 2012-05-25 DIAGNOSIS — R42 Dizziness and giddiness: Secondary | ICD-10-CM

## 2012-05-25 DIAGNOSIS — R079 Chest pain, unspecified: Secondary | ICD-10-CM

## 2012-05-25 DIAGNOSIS — R209 Unspecified disturbances of skin sensation: Secondary | ICD-10-CM

## 2012-05-25 DIAGNOSIS — G5600 Carpal tunnel syndrome, unspecified upper limb: Secondary | ICD-10-CM

## 2012-05-25 DIAGNOSIS — R202 Paresthesia of skin: Secondary | ICD-10-CM

## 2012-05-25 LAB — CK TOTAL AND CKMB (NOT AT ARMC): Relative Index: 0.5 (ref 0.0–2.5)

## 2012-05-25 LAB — TROPONIN I: Troponin I: <0.01 ng/mL (ref <0.06)

## 2012-05-25 LAB — D-DIMER, QUANTITATIVE: D-Dimer, Quant: 0.44 ug/mL-FEU (ref 0.00–0.48)

## 2012-05-25 MED ORDER — FLUTICASONE PROPIONATE 50 MCG/ACT NA SUSP: 1.0000 | Freq: Two times a day (BID) | NASAL | Status: DC | PRN

## 2012-05-25 NOTE — Progress Notes (Signed)
  Subjective:    Patient ID: [name], female    DOB: December 03, 1970, 42 y.o.   MRN: [id number]  HPI Her symptoms began 5-6 days ago as ears popping & lightheadedness w/o sore throat, rhinitis,  head congestion or chest congestion. Significant active  associated symptoms include frontal headache,occiptal pain, facial pain, & R sided dental pain . Flu shot  current Treatment with Neti pot  was not effective. There is no history of asthma  ; she has perennial allergies. The patient had never smoked .  Yesterday she developed constant aching pain in the right upper anterior chest which was nonexertional and nonradiating. It did not respond Gas-X but did respond after several hours to apple cider vinegar.  She's had some intermittent  numbness in the left forearm and left calf for 2 weeks. She is on gabapentin for peripheral neuropathy. Hand numbness occurs only @ night. Wearing braces @ night helps.                   Review of Systems Symptoms not present include sore throat, nasal purulence or  otic discharge. No cough, sputum production ,shortness of breath or wheezing . Perimenopausal  sweats  were present.  Fever & chills  not present  Extrinsic symptoms of  sneezing were present .  Itchy  & watery eyes  not noted. Myalgias and arthralgias were  Present  Eye: No blurred vision , diplopia, vision loss Cardiac prodrome: Racing last night.No palpitations, irregular rhythm,  nausea , sweating Pulmonary: No acute dyspnea,  hemoptysis Psych: Some anxiety,panic last night     Objective:   Physical Exam   Gen. appearance: Well-nourished, in no distress Eyes: Extraocular motion intact, field of vision normal, vision grossly intact, no nystagmus ENT: Canals clear, tympanic membranes normal, tuning fork exam normal, hearing grossly normal Neck: Normal range of motion, no masses, normal thyroid Cardiovascular: Rate and rhythm normal; no murmurs, gallops or extra heart  sounds  Chest: Tender in the area of chest pain with pressure of the stethoscope  Muscle skeletal: Range of motion, tone, &  strength normal. Left calf tender to compression; tenderness in the left popliteal space. Neuro:Oriented X3.No cranial nerve deficit, deep tendon  reflexes normal, gait  normal. Rhomberg & finger to nose negative. Lymph: No cervical or axillary LA Skin: Warm and dry without suspicious lesions or rashes Psych: no anxiety or mood change. Normally interactive and cooperative.            Assessment & Plan:  #1 sinus pressure with associated lightheadedness and imbalance  #2 atypical chest pain; chest wall suggested  #3 carpal tunnel syndrome symptoms in the hands. This may also explain the left forearm symptoms.  #4 paresthesias left calf, intermittent.  Plan: See orders and recommendations

## 2012-05-25 NOTE — Patient Instructions (Addendum)
Plain Mucinex (NOT D) for thick secretions ;force NON dairy fluids .   Nasal cleansing in the shower as discussed with lather of mild shampoo.After 10 seconds wash off lather while  exhaling through nostrils. Make sure that all residual soap is removed to prevent irritation.  Fluticasone 1 spray in each nostril twice a day as needed. Use the "crossover" technique into opposite nostril spraying toward opposite ear @ 45 degree angle, not straight up into nostril.  Use a Neti pot daily only  for significant sinus congestion; going from open side to congested side . Plain Allegra (NOT D )  160 daily , Loratidine 10 mg , OR Zyrtec 10 mg @ bedtime  as needed for itchy eyes & sneezing. Use an anti-inflammatory cream such as Aspercreme or Zostrix cream twice a day to the R chest as needed. In lieu of this warm moist compresses or  hot water bottle can be used. Do not apply ice.    If you activate the  My Chart system; lab & Xray results will be released directly  to you as soon as I review & address these through the computer. As per [facility] all records must be reviewed and signed off within 72 hours; but I attempt to complete this within 36 hours unless I have no access to the electronic medical record.If I wait more than 36 hours the volume of reports becomes difficult to manage optimally. In my absence ;my partners would address the results.Critical lab results will be communicated to you ASAP. If you choose not to sign up for My Chart within 36 hours of labs being drawn; results will be reviewed & interpretation added before being copied & mailed, causing a delay in getting the results to you.If you do not receive that report within 7-10 days ,please call. Additionally you can use this system to gain direct  access to your records  if  out of town or @ an office of a  physician who is not in  the My Chart network.  This improves continuity of care & places you in control of your medical record.

## 2012-06-11 ENCOUNTER — Other Ambulatory Visit: Payer: Self-pay

## 2012-06-11 MED ORDER — GLYCOPYRROLATE 2 MG PO TABS: 2.0000 mg | ORAL_TABLET | Freq: Two times a day (BID) | ORAL | Status: DC

## 2012-06-12 ENCOUNTER — Telehealth: Payer: Self-pay | Admitting: Internal Medicine

## 2012-06-12 ENCOUNTER — Other Ambulatory Visit: Payer: Self-pay | Admitting: *Deleted

## 2012-06-12 MED ORDER — ESTRADIOL 1 MG PO TABS: 1.5000 mg | ORAL_TABLET | Freq: Every day | ORAL | Status: DC

## 2012-06-12 MED ORDER — LEVOTHYROXINE SODIUM 112 MCG PO TABS: 112.0000 ug | ORAL_TABLET | Freq: Every morning | ORAL | Status: DC

## 2012-06-12 NOTE — Telephone Encounter (Signed)
Sent electronically 

## 2012-06-12 NOTE — Telephone Encounter (Signed)
refill Estradiol (Tab) 1 MG Take 1.5 tablets (1.5 mg total) by mouth daily -- requesting a 90-day supply, no last fill date--last wrt 9.26.13 #135 x 1-refill

## 2012-06-15 ENCOUNTER — Other Ambulatory Visit: Payer: Self-pay | Admitting: *Deleted

## 2012-06-15 MED ORDER — LEVOTHYROXINE SODIUM 112 MCG PO TABS: 112.0000 ug | ORAL_TABLET | Freq: Every morning | ORAL | Status: DC

## 2012-06-15 NOTE — Telephone Encounter (Signed)
E-scribed on 06/12/12. Did not go through. Resending.

## 2012-07-25 NOTE — Telephone Encounter (Signed)
Already completed

## 2012-08-14 ENCOUNTER — Encounter: Payer: Self-pay | Admitting: Internal Medicine

## 2012-08-14 ENCOUNTER — Ambulatory Visit (INDEPENDENT_AMBULATORY_CARE_PROVIDER_SITE_OTHER): Payer: Medicare Other | Admitting: Internal Medicine

## 2012-08-14 VITALS — BP 124/88 | HR 66 | Temp 98.4°F | Resp 14 | Ht 68.0 in | Wt 237.0 lb

## 2012-08-14 DIAGNOSIS — M79672 Pain in left foot: Secondary | ICD-10-CM

## 2012-08-14 DIAGNOSIS — M25562 Pain in left knee: Secondary | ICD-10-CM

## 2012-08-14 DIAGNOSIS — Z23 Encounter for immunization: Secondary | ICD-10-CM

## 2012-08-14 DIAGNOSIS — M25569 Pain in unspecified knee: Secondary | ICD-10-CM

## 2012-08-14 DIAGNOSIS — I10 Essential (primary) hypertension: Secondary | ICD-10-CM

## 2012-08-14 DIAGNOSIS — M79609 Pain in unspecified limb: Secondary | ICD-10-CM

## 2012-08-14 DIAGNOSIS — Z0000 Encounter for general adult medical examination without abnormal findings: Secondary | ICD-10-CM

## 2012-08-14 LAB — CBC WITH DIFFERENTIAL/PLATELET
Eosinophils Relative: 1.5 % (ref 0.0–5.0)
HCT: 38.3 % (ref 36.0–46.0)
Hemoglobin: 12.9 g/dL (ref 12.0–15.0)
Lymphs Abs: 2 10*3/uL (ref 0.7–4.0)
MCV: 84.7 fl (ref 78.0–100.0)
Monocytes Absolute: 0.5 10*3/uL (ref 0.1–1.0)
Monocytes Relative: 7.5 % (ref 3.0–12.0)
Neutro Abs: 4.6 10*3/uL (ref 1.4–7.7)
Platelets: 230 10*3/uL (ref 150.0–400.0)
RDW: 13.1 % (ref 11.5–14.6)
WBC: 7.3 10*3/uL (ref 4.5–10.5)

## 2012-08-14 LAB — HEPATIC FUNCTION PANEL
ALT: 22 U/L (ref 0–35)
AST: 21 U/L (ref 0–37)
Albumin: 3.4 g/dL — ABNORMAL LOW (ref 3.5–5.2)

## 2012-08-14 LAB — URIC ACID: Uric Acid, Serum: 4.9 mg/dL (ref 2.4–7.0)

## 2012-08-14 LAB — LIPID PANEL
Cholesterol: 144 mg/dL (ref 0–200)
Triglycerides: 90 mg/dL (ref 0.0–149.0)

## 2012-08-14 NOTE — Patient Instructions (Addendum)
Minimal Blood Pressure Goal= AVERAGE < 140/90;  Ideal is an AVERAGE < 135/85. This AVERAGE should be calculated from @ least 5-7 BP readings taken @ different times of day on different days of week. You should not respond to isolated BP readings , but rather the AVERAGE for that week .Please bring your  blood pressure cuff to office visits to verify that it is reliable.It  can also be checked against the blood pressure device at the pharmacy. Finger or wrist cuffs are not dependable; an arm cuff is. Share results with all non [facility] medical staff seen  Use an anti-inflammatory cream such as Aspercreme or Zostrix cream twice a day to the left knee as needed. In lieu of this warm moist compresses or  hot water bottle can be used. Do not apply ice to the knee.

## 2012-08-14 NOTE — Progress Notes (Signed)
Subjective:    Patient ID: [name], female    DOB: [birth year], 42 y.o.   MRN: [id number]  HPI  [name] is here for a physical;acute issues include persistent LLE pain from knee down anteriorly & posteriorly for several months.     Review of Systems  It is described as burning in dorsal foot &  Sharp pain in knee.The pain does not radiate. The discomfort lasts constantly.It is exacerbated by ambulation or standing from sitting.  Associated signs/symptoms include swelling in medial shin.No skin color change or  temperature change.She has had intermittent  leg weakness &  some LLE tingling.The pain was treated with Neurontin from Dr Newell Coral for possible S1 neuropathy ; response was insignificant . Tylenol AS helps more than Neurontin.    Constitutional: no fever, chills, sweats, change in weight  Musculoskeletal:no  muscle cramps or pain Skin:no rash Neuro: no  incontinence (stool/urine) Heme:no lymphadenopathy; abnormal bruising or bleeding        Objective:   Physical Exam Gen.: Healthy and well-nourished in appearance. Alert, appropriate and cooperative throughout exam.  Head: Normocephalic without obvious abnormalities Eyes: No corneal or conjunctival inflammation noted. Pupils equal round reactive to light and accommodation.  Extraocular motion intact. Vision grossly normal with lenses Ears: External  ear exam reveals no significant lesions or deformities. Canals clear .TMs normal. Hearing is grossly normal bilaterally. Nose: External nasal exam reveals no deformity or inflammation. Nasal mucosa are pink and moist. No lesions or exudates noted.   Mouth: Oral mucosa and oropharynx reveal no lesions or exudates. Teeth in good repair. Neck: No deformities, masses, or tenderness noted. Range of motion & Thyroid normal. Lungs: Normal respiratory effort; chest expands symmetrically. Lungs are clear to auscultation without rales, wheezes, or increased work of breathing. Heart:  Normal rate and rhythm. Normal S1 and S2. No gallop, click, or rub. S4 w/o murmur. Abdomen: Bowel sounds normal; abdomen soft and nontender. No masses, organomegaly or hernias noted. Genitalia: As per Gyn                                  Musculoskeletal/extremities: Slight accentuated curvature of upper thoracic  Spine. No clubbing, cyanosis, edema, or significant extremity  deformity noted. Range of motion normal .Tone & strength  Normal. She has some tenderness to palpation in the left popliteal space. There is tenderness to palpation over the lateral aspect of the dorsum of the left foot. She exhibits crepitus of the knees, left greater than right. No effusion is noted. Joints normal . Nail health good. Able to lie down & sit up w/o help. Negative SLR bilaterally Vascular: Carotid, radial artery, dorsalis pedis and  posterior tibial pulses are full and equal. No bruits present. Neurologic: Alert and oriented x3. Deep tendon reflexes symmetrical and normal.  Gait normal  including heel & toe walking . She describes pain with these maneuvers and the left foot.       Skin: Intact without suspicious lesions or rashes. Lymph: No cervical, axillary lymphadenopathy present. Psych: Mood and affect are normal. Normally interactive  Assessment & Plan:  #1 comprehensive physical exam; no acute findings  #2 left knee pain with possible popliteal cyst. Primary intraarticular inflammation suggested as the primary process rather than neuropathy  #3 left foot pain; rule out small bone injury/fracture  Plan: see Orders  & Recommendations

## 2012-09-19 ENCOUNTER — Other Ambulatory Visit: Payer: Self-pay | Admitting: *Deleted

## 2012-09-19 MED ORDER — LEVOTHYROXINE SODIUM 112 MCG PO TABS: 112.0000 ug | ORAL_TABLET | Freq: Every morning | ORAL | Status: DC

## 2012-09-25 ENCOUNTER — Other Ambulatory Visit: Payer: Self-pay | Admitting: *Deleted

## 2012-09-25 ENCOUNTER — Encounter: Payer: Self-pay | Admitting: Cardiovascular Disease

## 2012-09-25 ENCOUNTER — Telehealth: Payer: Self-pay | Admitting: Gastroenterology

## 2012-09-25 ENCOUNTER — Ambulatory Visit (INDEPENDENT_AMBULATORY_CARE_PROVIDER_SITE_OTHER): Payer: Medicare Other | Admitting: Cardiovascular Disease

## 2012-09-25 VITALS — BP 134/94 | HR 75

## 2012-09-25 DIAGNOSIS — I10 Essential (primary) hypertension: Secondary | ICD-10-CM

## 2012-09-25 DIAGNOSIS — R079 Chest pain, unspecified: Secondary | ICD-10-CM

## 2012-09-25 MED ORDER — LISINOPRIL 40 MG PO TABS
40.0000 mg | ORAL_TABLET | Freq: Every day | ORAL | Status: DC
Start: 2012-09-25 — End: 2013-04-18

## 2012-09-25 MED ORDER — HYDROCHLOROTHIAZIDE 12.5 MG PO CAPS: 12.5000 mg | ORAL_CAPSULE | Freq: Every day | ORAL | Status: DC

## 2012-09-25 MED ORDER — LEVOTHYROXINE SODIUM 112 MCG PO TABS: 112.0000 ug | ORAL_TABLET | Freq: Every morning | ORAL | Status: DC

## 2012-09-25 NOTE — Telephone Encounter (Signed)
Left a message for patient to return my call. 

## 2012-09-25 NOTE — Progress Notes (Signed)
History of Present Illness: 42 yo WF with history of lupus, inflammatory arthritis, HTN, hypothyroidism here for follow up. She has not been seen in our office since June 2013. She had been followed in the past by Dr. Shelva Majestic. She had a normal echo 01/24/08. She has a strong family history of CAD. I saw her in 2013 and she told me that she had right sided chest pain for one week. She did describe fatigue, dizziness, nausea and numbness in her left arm. Her BP is well controlled at home. She also has been having panic attacks and at times feels that her heart racing. I arranged a treadmill stress test and this was ok June 2013. Echo June 2013 with normal LV size and function, no valve issues.   Primary Care Physician: Marga Melnick  Last Lipid Profile:Lipid Panel     Component Value Date/Time   CHOL 144 08/14/2012 1129   TRIG 90.0 08/14/2012 1129   HDL 53.90 08/14/2012 1129   CHOLHDL 3 08/14/2012 1129   VLDL 18.0 08/14/2012 1129   LDLCALC 72 08/14/2012 1129     Past Medical History  Diagnosis Date  . Hypothyroidism   . Hypertension     a  . Anxiety   . Depression     during divorce & legal matters  . Osteoarthritis   . GERD (gastroesophageal reflux disease)   . IBS (irritable bowel syndrome)   . Lupus hx positive ANA    followed by Dr Zenovia Jordan; possible lupus  . History of polycystic ovarian disease S/P BSO  . Polyarthritis, inflammatory   . Chronic joint pain   . Myalgia   . History of kidney stones   . Hx of anxiety disorder     Past Surgical History  Procedure Laterality Date  . Eye surgery      Retinal tears surgery bilateral  . Tonsillectomy    . Carpal tunnel release      bilateral; Dr Newell Coral  . Plantar fascia surgery      right  . Plantar fascia surgery  02/2011    left  . Cystoscopy w/ ureteral stent placement  11/12/2011    Procedure: CYSTOSCOPY WITH RETROGRADE PYELOGRAM/URETERAL STENT PLACEMENT;  Surgeon: Sebastian Ache, MD;  Location: [facility];  Service:  Urology;  Laterality: Right;  . Laparoscopic cholecystectomy  05-31-2007  . Cysto/ right retrograde ureteral pyelogram  07-09-2004    HX BILATERAL RENAL STONES/ RIGHT FLANK PAIN  . Excision left bartholin gland  02-05-2002  . Laparoscopy with right salpingo-oophectomy/ lysis adhesions and ablation endometriosis  05-17-2001  . Right ureteroscopic stone extraction  08-04-2000  . Laparoscopic laser ablation endometriosis and left salpingo-oophorectomy  11-03-1999  . Laparoscopic assisted vaginal hysterectomy  2000  . Dilation and curettage of uterus      X5  . Ankle surgery      X3  . Appendectomy  1991    exploratory lap  . Lumbar laminectomy  2003    L4 - L5  . Ureteral stent placement  11/12/11    Dr Berneice Heinrich  . Cystoscopy w/ ureteral stent removal  12/07/2011    Procedure: CYSTOSCOPY WITH STENT REMOVAL;  Surgeon: Sebastian Ache, MD;  Location[facility];  Service: Urology;  Laterality: Right;  . Cystoscopy/retrograde/ureteroscopy/stone extraction with basket  12/07/2011    Procedure: CYSTOSCOPY/RETROGRADE/URETEROSCOPY/STONE EXTRACTION WITH BASKET;  Surgeon: Sebastian Ache, MD;  Location: [facility];  Service: Urology;  Laterality: Right;  . Colonoscopy  in 1990s    Current Outpatient Prescriptions  Medication Sig Dispense Refill  . aspirin 81 MG tablet Take 81 mg by mouth daily.       . cholecalciferol (VITAMIN D) 1000 UNITS tablet Take 1,000 Units by mouth daily.      [name] estradiol (ESTRACE) 1 MG tablet Take 1.5 tablets (1.5 mg total) by mouth daily.  135 tablet  2  . fish oil-omega-3 fatty acids 1000 MG capsule Take 1 g by mouth daily.      [name] glycopyrrolate (ROBINUL) 2 MG tablet Take 1 tablet (2 mg total) by mouth 2 (two) times daily.  180 tablet  0  . hydrochlorothiazide (MICROZIDE) 12.5 MG capsule Take 1 capsule (12.5 mg total) by mouth daily.  90 capsule  1  . hydroxychloroquine (PLAQUENIL) 200 MG tablet Take 200 mg by mouth 2 (two) times daily.        [name] levothyroxine (SYNTHROID, LEVOTHROID) 112 MCG tablet Take 1 tablet (112 mcg total) by mouth every morning.  90 tablet  0  . lisinopril (PRINIVIL,ZESTRIL) 40 MG tablet Take 1 tablet (40 mg total) by mouth daily.  90 tablet  1  . meloxicam (MOBIC) 7.5 MG tablet Take 7.5 mg by mouth daily. RX'ed by Orlin Hilding      . omeprazole (PRILOSEC) 40 MG capsule Take 1 capsule (40 mg total) by mouth every morning.  90 capsule  3  . POTASSIUM CITRATE PO Take 2 capsules by mouth daily.       No current facility-administered medications for this visit.    Allergies  Allergen Reactions  . Nalbuphine Shortness Of Breath and Rash    Nubain caused respiratory distress & rash  . Sulfamethoxazole W-Trimethoprim Rash    rash  . Tylox (Oxycodone-Acetaminophen) Rash  . Ivp Dye (Iodinated Diagnostic Agents) Rash and Other (See Comments)    Flushing & minor facial rash    History   Social History  . Marital Status: Divorced    Spouse Name: N/A    Number of Children: 0  . Years of Education: N/A   Occupational History  . disabled    Social History Main Topics  . Smoking status: Never Smoker   . Smokeless tobacco: Never Used  . Alcohol Use: Yes     Comment:  very rarely  . Drug Use: No  . Sexually Active: Not on file   Other Topics Concern  . Not on file   Social History Narrative  . No narrative on file    Family History  Problem Relation Age of Onset  . Hypertension Mother   . Colon polyps Mother   . Atrial fibrillation Mother     Dr Clifton James  . Heart attack Father 82  . Diabetes Father   . Hypertension Sister   . Heart attack Paternal Grandmother 36  . Breast cancer Paternal Aunt   . Colon cancer Maternal Uncle   . Colon cancer Paternal Uncle   . Colon polyps Maternal Grandmother   . Stroke Maternal Grandmother 81  . Diabetes Paternal Grandfather   . Stroke Maternal Grandfather 37    Review of Systems:  As stated in the HPI and otherwise negative.   BP 134/94  Pulse  75  Physical Examination: General: Well developed, well nourished, NAD HEENT: OP clear, mucus membranes moist SKIN: warm, dry. No rashes. Neuro: No focal deficits Musculoskeletal: Muscle strength 5/5 all ext Psychiatric: Mood and affect normal Neck: No JVD, no carotid bruits, no thyromegaly, no lymphadenopathy. Lungs:Clear  bilaterally, no wheezes, rhonci, crackles Cardiovascular: Regular rate and rhythm. No murmurs, gallops or rubs. Abdomen:Soft. Bowel sounds present. Non-tender.  Extremities: No lower extremity edema. Pulses are 2 + in the bilateral DP/PT.  EKG: NSR, rate 75 bpm.   Echo 09/27/11: Left ventricle: The cavity size was normal. Wall thickness was increased in a pattern of mild LVH. Systolic function was normal. The estimated ejection fraction was in the range of 60% to 65%. Wall motion was normal; there were no regional wall motion abnormalities. Left ventricular diastolic function parameters were normal. - Atrial septum: No defect or patent foramen ovale was identified.  Assessment and Plan:   1. Chest pain:  Does not not sound cardiac. Stress test with no ischemia. Echo overall normal. Continue BP meds. I will see her back in one year.

## 2012-09-25 NOTE — Patient Instructions (Addendum)
Your physician wants you to follow-up in:  12 months.  You will receive a reminder letter in the mail two months in advance. If you don't receive a letter, please call our office to schedule the follow-up appointment.   

## 2012-09-27 MED ORDER — GLYCOPYRROLATE 2 MG PO TABS: 2.0000 mg | ORAL_TABLET | Freq: Two times a day (BID) | ORAL | Status: DC

## 2012-09-27 NOTE — Telephone Encounter (Signed)
Told patient that she needs a yearly follow up visit for IBS and prescription refills. Told patient she can get it through her PCP if she would like but patient states she can just schedule an appt with [place]. Scheduled appt on 11/12/12 because patient states she is going out of town in July and cannot schedule for then. Told patient that is fine but must keep appt for any further refills. Pt agreed. Robinul sent to [facility] pharmacy with no refills.

## 2012-11-09 ENCOUNTER — Ambulatory Visit (INDEPENDENT_AMBULATORY_CARE_PROVIDER_SITE_OTHER): Payer: Medicare Other | Admitting: Family Medicine

## 2012-11-09 ENCOUNTER — Encounter: Payer: Self-pay | Admitting: Family Medicine

## 2012-11-09 VITALS — BP 112/70 | HR 80 | Temp 98.1°F | Wt 240.6 lb

## 2012-11-09 DIAGNOSIS — R059 Cough, unspecified: Secondary | ICD-10-CM

## 2012-11-09 DIAGNOSIS — R05 Cough: Secondary | ICD-10-CM

## 2012-11-09 DIAGNOSIS — J019 Acute sinusitis, unspecified: Secondary | ICD-10-CM

## 2012-11-09 MED ORDER — BECLOMETHASONE DIPROPIONATE 80 MCG/ACT NA AERS: 2.0000 | INHALATION_SPRAY | Freq: Every day | NASAL | Status: DC

## 2012-11-09 MED ORDER — AMOXICILLIN-POT CLAVULANATE 875-125 MG PO TABS: 1.0000 | ORAL_TABLET | Freq: Two times a day (BID) | ORAL | Status: DC

## 2012-11-09 NOTE — Progress Notes (Signed)
  Subjective:     [name] is a 42 y.o. female who presents for evaluation of sinus pain. Symptoms include: congestion, cough, facial pain, nasal congestion, post nasal drip, sinus pressure and L eye red and draining. Onset of symptoms was 4 days ago. Symptoms have been gradually worsening since that time. Past history is significant for no history of pneumonia or bronchitis. Patient is a non-smoker.  Pt also c/o difficulty catching her breath and dry cough at times.   No fevers.  The following portions of the patient's history were reviewed and updated as appropriate: allergies, current medications, past family history, past medical history, past social history, past surgical history and problem list.  Review of Systems Pertinent items are noted in HPI.   Objective:    BP 112/70  Pulse 80  Temp(Src) 98.1 F (36.7 C) (Oral)  Wt 240 lb 9.6 oz (109.135 kg)  BMI 36.59 kg/m2  SpO2 98% General appearance: alert, cooperative, appears stated age and no distress Eyes: L eye injected and tearing Ears: normal TM's and external ear canals both ears Nose: green discharge, moderate congestion, turbinates red, swollen, sinus tenderness left Throat: lips, mucosa, and tongue normal; teeth and gums normal Neck: moderate anterior cervical adenopathy, supple, symmetrical, trachea midline and thyroid not enlarged, symmetric, no tenderness/mass/nodules Lungs: clear to auscultation bilaterally Heart: S1, S2 normal    Assessment:    Acute bacterial sinusitis.   cough--- if no better by Monday check CXR Plan:    Nasal steroids per medication orders. Antihistamines per medication orders. Augmentin per medication orders.  F/u prn

## 2012-11-09 NOTE — Patient Instructions (Signed)

## 2012-11-12 ENCOUNTER — Other Ambulatory Visit: Payer: Self-pay | Admitting: Otolaryngology

## 2012-11-12 ENCOUNTER — Ambulatory Visit
Admission: RE | Admit: 2012-11-12 | Discharge: 2012-11-12 | Disposition: A | Discharge status: Home / Self Care | Payer: Medicare Other | Source: Ambulatory Visit | Attending: Otolaryngology | Admitting: Otolaryngology

## 2012-11-12 ENCOUNTER — Telehealth: Payer: Self-pay | Admitting: Internal Medicine

## 2012-11-12 ENCOUNTER — Ambulatory Visit: Payer: Medicare Other | Admitting: Family Medicine

## 2012-11-12 ENCOUNTER — Encounter: Payer: Self-pay | Admitting: Gastroenterology

## 2012-11-12 ENCOUNTER — Ambulatory Visit (INDEPENDENT_AMBULATORY_CARE_PROVIDER_SITE_OTHER): Payer: Medicare Other | Admitting: Gastroenterology

## 2012-11-12 VITALS — BP 128/80 | HR 80 | Ht 68.0 in | Wt 237.4 lb

## 2012-11-12 DIAGNOSIS — R0981 Nasal congestion: Secondary | ICD-10-CM

## 2012-11-12 DIAGNOSIS — K219 Gastro-esophageal reflux disease without esophagitis: Secondary | ICD-10-CM

## 2012-11-12 DIAGNOSIS — K589 Irritable bowel syndrome without diarrhea: Secondary | ICD-10-CM

## 2012-11-12 MED ORDER — GLYCOPYRROLATE 2 MG PO TABS: 2.0000 mg | ORAL_TABLET | Freq: Two times a day (BID) | ORAL | Status: DC

## 2012-11-12 MED ORDER — OMEPRAZOLE 40 MG PO CPDR: 40.0000 mg | DELAYED_RELEASE_CAPSULE | Freq: Every morning | ORAL | Status: DC

## 2012-11-12 NOTE — Patient Instructions (Signed)
We have sent the following medications to your mail order pharmacy: Robinul and omeprazole.   Thank you for choosing me and [facility].  Venita Lick. Pleas Koch., MD., Clementeen Graham

## 2012-11-12 NOTE — Telephone Encounter (Signed)
Noted, patient with appointment at 3:15 today with ENT

## 2012-11-12 NOTE — Telephone Encounter (Signed)
Patient Information:  Caller Name: Shalia  Phone: [phone]  Patient: [name]  Gender: Female  DOB: November 13, 1970  Age: 42 Years  PCP: Marga Melnick  Pregnant: No  Office Follow Up:  Does the office need to follow up with this patient?: No  Instructions For The Office: N/A  RN Note:  Patient states she believes she is worse. ENT requested that she contact PCP because her appt is not until 3:15. Contacted the office and spoke with Tameka and booked patient at 11:00 a.m. she is currently at her GI physician. She declines the appt and will not miss GI appt. Other appt is 4:15 . patient will need to go to ER for evaluation if needed. Patient expressed [place]  Symptoms  Reason For Call & Symptoms: Patient in the office on Friday and saw Dr. Laury Axon for a sinus infection. She was placed on Augmentin.  She states she is still having symptoms. Swollen lymph node around Lft ear and Left cheek swollen, + headache,  Left eye is  bloodshot. She was told to call back.  She has an appt with ENT at3:15 today with Dr. Ezzard Standing.  No difficulty breathing or swallowing. . Afebrile.  Reviewed Health History In EMR: Yes  Reviewed Medications In EMR: Yes  Reviewed Allergies In EMR: Yes  Reviewed Surgeries / Procedures: Yes  Date of Onset of Symptoms: 11/09/2012  Treatments Tried: Augmentin x7 doses  Treatments Tried Worked: No OB / GYN:  LMP: Unknown  Guideline(s) Used:  Sinus Pain and Congestion  Disposition Per Guideline:   Go to Office Now  Reason For Disposition Reached:   Redness or swelling on the cheek, forehead, or around the eye  Advice Given:  Call Back If:   You become worse.  RN Overrode Recommendation:  Go To ED  Advised caller if she cannot come to 11:00 work in with Dr. Laury Axon there are no other appt available UNTIL AFTER HER ENT appt. Understanding expressed

## 2012-11-12 NOTE — Progress Notes (Signed)
History of Present Illness: This is a 42 year old female returning for followup of irritable bowel syndrome and GERD. Both are very well controlled on her current regimen. She has no gastrointestinal complaints. Denies weight loss, abdominal pain, constipation, diarrhea, change in stool caliber, melena, hematochezia, nausea, vomiting, dysphagia, reflux symptoms, chest pain.  Current Medications, Allergies, Past Medical History, Past Surgical History, Family History and Social History were reviewed in [employer] record.  Physical Exam: General: Well developed , well nourished, no acute distress Head: Normocephalic and atraumatic Eyes:  sclerae anicteric, EOMI Ears: Normal auditory acuity Mouth: No deformity or lesions Lungs: Clear throughout to auscultation Heart: Regular rate and rhythm; no murmurs, rubs or bruits Abdomen: Soft, non tender and non distended. No masses, hepatosplenomegaly or hernias noted. Normal Bowel sounds Musculoskeletal: Symmetrical with no gross deformities  Pulses:  Normal pulses noted Extremities: No clubbing, cyanosis, edema or deformities noted Neurological: Alert oriented x 4, grossly nonfocal Psychological:  Alert and cooperative. Normal mood and affect  Assessment and Recommendations:  1. Irritable bowel syndrome, diarrhea predominant. I recommended to proceed with colonoscopy to evaluate for other causes of her symptoms including colitis. She declines at this time but states she will do so in the future if her symptoms worsen. Continue glycopyrrolate 2 mg twice a day.   2. GERD. Continue antireflux measures and omeprazole 40 mg daily

## 2012-11-13 ENCOUNTER — Ambulatory Visit: Payer: Medicare Other | Admitting: Family Medicine

## 2012-11-20 ENCOUNTER — Other Ambulatory Visit: Payer: Self-pay | Admitting: Internal Medicine

## 2012-11-20 DIAGNOSIS — Z1231 Encounter for screening mammogram for malignant neoplasm of breast: Secondary | ICD-10-CM

## 2012-12-21 ENCOUNTER — Ambulatory Visit (HOSPITAL_COMMUNITY)
Admission: RE | Admit: 2012-12-21 | Discharge: 2012-12-21 | Disposition: A | Discharge status: Home / Self Care | Payer: Medicare Other | Source: Ambulatory Visit | Attending: Internal Medicine | Admitting: Internal Medicine

## 2012-12-21 DIAGNOSIS — Z1231 Encounter for screening mammogram for malignant neoplasm of breast: Secondary | ICD-10-CM | POA: Insufficient documentation

## 2013-01-02 ENCOUNTER — Ambulatory Visit (INDEPENDENT_AMBULATORY_CARE_PROVIDER_SITE_OTHER): Payer: Medicare Other

## 2013-01-02 DIAGNOSIS — Z23 Encounter for immunization: Secondary | ICD-10-CM

## 2013-01-07 ENCOUNTER — Telehealth: Payer: Self-pay | Admitting: *Deleted

## 2013-01-07 ENCOUNTER — Other Ambulatory Visit: Payer: Self-pay | Admitting: *Deleted

## 2013-01-07 MED ORDER — LEVOTHYROXINE SODIUM 112 MCG PO TABS: 112.0000 ug | ORAL_TABLET | Freq: Every morning | ORAL | Status: DC

## 2013-01-07 NOTE — Telephone Encounter (Signed)
Patient called and stated that she will no longer be seeing Dr. Everardo All and she would like to know if Dr. Alwyn Ren can start prescribing her levothyroxine. She is stating that she needs a refill. Her last lipid panel was 08/14/2012 and was done here. Please advise.

## 2013-01-07 NOTE — Telephone Encounter (Signed)
Levothyroxine refill sent to pharmacy 

## 2013-01-07 NOTE — Telephone Encounter (Signed)
Patient called and stated that she will no longer be seeing Dr. Ellison and she would like to know if Dr. Hopper can start prescribing her levothyroxine. She is stating that she needs a refill. Her last lipid panel was 08/14/2012 and was done here. Please advise.  

## 2013-01-07 NOTE — Telephone Encounter (Signed)
Refill #[id number]; last labs were 2/14. Please  schedule follow up Labs & follow up appt in May 30, 2022:  TSH. Diagnoses /Codes: 244.9

## 2013-01-15 ENCOUNTER — Ambulatory Visit (INDEPENDENT_AMBULATORY_CARE_PROVIDER_SITE_OTHER): Payer: Medicare Other | Admitting: Internal Medicine

## 2013-01-15 ENCOUNTER — Encounter: Payer: Self-pay | Admitting: Internal Medicine

## 2013-01-15 VITALS — BP 113/71 | HR 83 | Temp 99.0°F | Wt 235.8 lb

## 2013-01-15 DIAGNOSIS — R42 Dizziness and giddiness: Secondary | ICD-10-CM

## 2013-01-15 DIAGNOSIS — F410 Panic disorder [episodic paroxysmal anxiety] without agoraphobia: Secondary | ICD-10-CM

## 2013-01-15 DIAGNOSIS — H8110 Benign paroxysmal vertigo, unspecified ear: Secondary | ICD-10-CM

## 2013-01-15 DIAGNOSIS — E663 Overweight: Secondary | ICD-10-CM

## 2013-01-15 NOTE — Progress Notes (Signed)
Subjective:    Patient ID: [name], female    DOB: 10-02-1970, 42 y.o.   MRN: [id number]  HPI  Symptoms began as discomfort with clear drainage from the right ear. The same symptoms occurred on the left 01/14/13. She's had associated dizziness and also intermittent vertigo associated with nausea. She also is noted some symptoms turning over in bed or rotating her head to the right. The vertiginous symptoms are more prominent than the benign positional vertigo related symptoms  She questions whether the symptoms may be related to performing water aerobics 3 times a week at the gym since 12/03/12. She does get her head under water.  The over-the-counter product swimmer's ear and apple cider vinegar/alcohol, and H2O2 have not been of benefit.  She's had some right facial sinus pressure and discomfort in the right temporo mandibular joint area. She's had tinnitus for the last 3-4 days.  She's had minor green discharge from the right knee are recently.  She has a past history of seasonal allergies and previously was on allergy shots. These were stopped when her allergist relocated. She does have significant sneezing. She denies itchy, watery eyes. She has had some dry cough.  Significantly  twice in the past week she's had panic attacks with tachycardia, dyspnea, & sweating for several minutes while sitting in chair or car. These began as an adult but never been treated. Her mother has panic attacks also. There was no specific trigger for this. She denies a constellation of headache, flushing, chest pain, or diarrhea   Review of Systems  She is not having associated fever, chills, sweats,or abnormal weight change. 3 # loss with lifestyle changes  She also denies dental pain, sore throat, or sputum.     Objective:   Physical Exam Gen.:  well-nourished in appearance. Alert, appropriate and cooperative throughout exam.Appears younger than stated age  Head: Normocephalic without obvious  abnormalities Eyes: No corneal or conjunctival inflammation noted. Pupils equal round reactive to light and accommodation. FOV normal. Extraocular motion intact. Vision grossly normal with lenses. There is no nystagmus with extraocular motion or when she is supine. Ears: External  ear exam reveals no significant lesions or deformities. Canals with small wax collections .TMs normal. Hearing is grossly normal bilaterally. She describes whisper is being distorted on the right. Tuning fork reveals lateralization to the right. Air conduction is greater than bone conduction bilaterally Nose: External nasal exam reveals no deformity or inflammation. Nasal mucosa are pink and moist. No lesions or exudates noted.  Mouth: Oral mucosa and oropharynx reveal no lesions or exudates. Teeth in good repair. Minor crepitus @ TMJ bilaterally Neck: No deformities, masses, or tenderness noted. Range of motion good. Lungs: Normal respiratory effort; chest expands symmetrically. Lungs are clear to auscultation without rales, wheezes, or increased work of breathing. Heart: Normal rate and rhythm. Normal S1 and S2. No gallop, click, or rub.No murmur.                                  Musculoskeletal/extremities:  No clubbing, cyanosis, edema, or significant extremity  deformity noted. Range of motion normal .Tone & strength  Normal. Joints normal . Nail health good. Able to lie down & sit up w/o help.  Vascular: Carotid, radial artery pulses are full and equal. No bruits present. Neurologic: Alert and oriented x3. Deep tendon reflexes symmetrical but 1/2 + @ knees  Romberg and finger to nose  testing negative. No cranial nerve deficit present        Skin: Intact without suspicious lesions or rashes. Lymph: No cervical, axillary lymphadenopathy present. Psych: Mood and affect are normal. Normally interactive                                                                                        Assessment & Plan:  #1  vertigo and benign positional vertigo in the context of new onset earache, tinnitus, and clear discharge. Abnormal tuning fork exam with lateralization to the right. This this is in the context of water aerobic exercise 3 times a week over the last 6 weeks.  #2 panic attacks  Plan :see orders

## 2013-01-15 NOTE — Patient Instructions (Addendum)
Follow a  low carb nutrition program such as [place],  or The New Sugar Busters  to prevent Diabetes progression . White carbohydrates (potatoes, rice, bread, and pasta) have a high spike of sugar and a high load of sugar. For example a  baked potato has a cup of sugar and a  french fry  2 teaspoons of sugar. Yams, wild  rice, whole grained bread &  wheat pasta have been much lower spike and load of  sugar. Portions should be the size of a deck of cards or your palm. Plain Mucinex (NOT D) for thick secretions ;force NON dairy fluids .   Nasal cleansing in the shower as discussed with lather of mild shampoo.After 10 seconds wash off lather while  exhaling through nostrils. Make sure that all residual soap is removed to prevent irritation.  Nasacort AQ OTC  1 spray in each nostril twice a day as needed. Use the "crossover" technique into opposite nostril spraying toward opposite ear @ 45 degree angle, not straight up into nostril.  Use a Neti pot daily only  as needed for significant sinus congestion; going from open side to congested side . Plain Allegra (NOT D )  160 daily , Loratidine 10 mg , OR Zyrtec 10 mg @ bedtime  as needed for itchy eyes & sneezing.

## 2013-01-23 ENCOUNTER — Encounter: Payer: Self-pay | Admitting: Podiatry

## 2013-01-23 ENCOUNTER — Ambulatory Visit (INDEPENDENT_AMBULATORY_CARE_PROVIDER_SITE_OTHER): Payer: Medicare Other | Admitting: Podiatry

## 2013-01-23 ENCOUNTER — Ambulatory Visit (INDEPENDENT_AMBULATORY_CARE_PROVIDER_SITE_OTHER): Payer: Medicare Other

## 2013-01-23 VITALS — BP 96/70 | HR 59 | Resp 16 | Ht 68.0 in | Wt 215.0 lb

## 2013-01-23 DIAGNOSIS — R52 Pain, unspecified: Secondary | ICD-10-CM

## 2013-01-23 DIAGNOSIS — M722 Plantar fascial fibromatosis: Secondary | ICD-10-CM

## 2013-01-23 MED ORDER — METHYLPREDNISOLONE (PAK) 4 MG PO TABS: ORAL_TABLET | ORAL | Status: DC

## 2013-01-23 NOTE — Patient Instructions (Signed)
Plantar Fasciitis (Heel Spur Syndrome) with Rehab The plantar fascia is a fibrous, ligament-like, soft-tissue structure that spans the bottom of the foot. Plantar fasciitis is a condition that causes pain in the foot due to inflammation of the tissue. SYMPTOMS   Pain and tenderness on the underneath side of the foot.  Pain that worsens with standing or walking. CAUSES  Plantar fasciitis is caused by irritation and injury to the plantar fascia on the underneath side of the foot. Common mechanisms of injury include:  Direct trauma to bottom of the foot.  Damage to a small nerve that runs under the foot where the main fascia attaches to the heel bone.  Stress placed on the plantar fascia due to bone spurs. RISK INCREASES WITH:   Activities that place stress on the plantar fascia (running, jumping, pivoting, or cutting).  Poor strength and flexibility.  Improperly fitted shoes.  Tight calf muscles.  Flat feet.  Failure to warm-up properly before activity.  Obesity. PREVENTION  Warm up and stretch properly before activity.  Allow for adequate recovery between workouts.  Maintain physical fitness:  Strength, flexibility, and endurance.  Cardiovascular fitness.  Maintain a health body weight.  Avoid stress on the plantar fascia.  Wear properly fitted shoes, including arch supports for individuals who have flat feet. PROGNOSIS  If treated properly, then the symptoms of plantar fasciitis usually resolve without surgery. However, occasionally surgery is necessary. RELATED COMPLICATIONS   Recurrent symptoms that may result in a chronic condition.  Problems of the lower back that are caused by compensating for the injury, such as limping.  Pain or weakness of the foot during push-off following surgery.  Chronic inflammation, scarring, and partial or complete fascia tear, occurring more often from repeated injections. TREATMENT  Treatment initially involves the use of  ice and medication to help reduce pain and inflammation. The use of strengthening and stretching exercises may help reduce pain with activity, especially stretches of the Achilles tendon. These exercises may be performed at home or with a therapist. Your caregiver may recommend that you use heel cups of arch supports to help reduce stress on the plantar fascia. Occasionally, corticosteroid injections are given to reduce inflammation. If symptoms persist for greater than 6 months despite non-surgical (conservative), then surgery may be recommended.  MEDICATION   If pain medication is necessary, then nonsteroidal anti-inflammatory medications, such as aspirin and ibuprofen, or other minor pain relievers, such as acetaminophen, are often recommended.  Do not take pain medication within 7 days before surgery.  Prescription pain relievers may be given if deemed necessary by your caregiver. Use only as directed and only as much as you need.  Corticosteroid injections may be given by your caregiver. These injections should be reserved for the most serious cases, because they may only be given a certain number of times. HEAT AND COLD  Cold treatment (icing) relieves pain and reduces inflammation. Cold treatment should be applied for 10 to 15 minutes every 2 to 3 hours for inflammation and pain and immediately after any activity that aggravates your symptoms. Use ice packs or massage the area with a piece of ice (ice massage).  Heat treatment may be used prior to performing the stretching and strengthening activities prescribed by your caregiver, physical therapist, or athletic trainer. Use a heat pack or soak the injury in warm water. SEEK IMMEDIATE MEDICAL CARE IF:  Treatment seems to offer no benefit, or the condition worsens.  Any medications produce adverse side effects. EXERCISES RANGE   OF MOTION (ROM) AND STRETCHING EXERCISES - Plantar Fasciitis (Heel Spur Syndrome) These exercises may help you  when beginning to rehabilitate your injury. Your symptoms may resolve with or without further involvement from your physician, physical therapist or athletic trainer. While completing these exercises, remember:   Restoring tissue flexibility helps normal motion to return to the joints. This allows healthier, less painful movement and activity.  An effective stretch should be held for at least 30 seconds.  A stretch should never be painful. You should only feel a gentle lengthening or release in the stretched tissue. RANGE OF MOTION - Toe Extension, Flexion  Sit with your right / left leg crossed over your opposite knee.  Grasp your toes and gently pull them back toward the top of your foot. You should feel a stretch on the bottom of your toes and/or foot.  Hold this stretch for __________ seconds.  Now, gently pull your toes toward the bottom of your foot. You should feel a stretch on the top of your toes and or foot.  Hold this stretch for __________ seconds. Repeat __________ times. Complete this stretch __________ times per day.  RANGE OF MOTION - Ankle Dorsiflexion, Active Assisted  Remove shoes and sit on a chair that is preferably not on a carpeted surface.  Place right / left foot under knee. Extend your opposite leg for support.  Keeping your heel down, slide your right / left foot back toward the chair until you feel a stretch at your ankle or calf. If you do not feel a stretch, slide your bottom forward to the edge of the chair, while still keeping your heel down.  Hold this stretch for __________ seconds. Repeat __________ times. Complete this stretch __________ times per day.  STRETCH  Gastroc, Standing  Place hands on wall.  Extend right / left leg, keeping the front knee somewhat bent.  Slightly point your toes inward on your back foot.  Keeping your right / left heel on the floor and your knee straight, shift your weight toward the wall, not allowing your back to  arch.  You should feel a gentle stretch in the right / left calf. Hold this position for __________ seconds. Repeat __________ times. Complete this stretch __________ times per day. STRETCH  Soleus, Standing  Place hands on wall.  Extend right / left leg, keeping the other knee somewhat bent.  Slightly point your toes inward on your back foot.  Keep your right / left heel on the floor, bend your back knee, and slightly shift your weight over the back leg so that you feel a gentle stretch deep in your back calf.  Hold this position for __________ seconds. Repeat __________ times. Complete this stretch __________ times per day. STRETCH  Gastrocsoleus, Standing  Note: This exercise can place a lot of stress on your foot and ankle. Please complete this exercise only if specifically instructed by your caregiver.   Place the ball of your right / left foot on a step, keeping your other foot firmly on the same step.  Hold on to the wall or a rail for balance.  Slowly lift your other foot, allowing your body weight to press your heel down over the edge of the step.  You should feel a stretch in your right / left calf.  Hold this position for __________ seconds.  Repeat this exercise with a slight bend in your right / left knee. Repeat __________ times. Complete this stretch __________ times per day.    STRENGTHENING EXERCISES - Plantar Fasciitis (Heel Spur Syndrome)  These exercises may help you when beginning to rehabilitate your injury. They may resolve your symptoms with or without further involvement from your physician, physical therapist or athletic trainer. While completing these exercises, remember:   Muscles can gain both the endurance and the strength needed for everyday activities through controlled exercises.  Complete these exercises as instructed by your physician, physical therapist or athletic trainer. Progress the resistance and repetitions only as guided. STRENGTH - Towel  Curls  Sit in a chair positioned on a non-carpeted surface.  Place your foot on a towel, keeping your heel on the floor.  Pull the towel toward your heel by only curling your toes. Keep your heel on the floor.  If instructed by your physician, physical therapist or athletic trainer, add ____________________ at the end of the towel. Repeat __________ times. Complete this exercise __________ times per day. STRENGTH - Ankle Inversion  Secure one end of a rubber exercise band/tubing to a fixed object (table, pole). Loop the other end around your foot just before your toes.  Place your fists between your knees. This will focus your strengthening at your ankle.  Slowly, pull your big toe up and in, making sure the band/tubing is positioned to resist the entire motion.  Hold this position for __________ seconds.  Have your muscles resist the band/tubing as it slowly pulls your foot back to the starting position. Repeat __________ times. Complete this exercises __________ times per day.  Document Released: 03/21/2005 Document Revised: 06/13/2011 Document Reviewed: 07/03/2008 [employer] Patient Information 2014 [employer]. Plantar Fasciitis Plantar fasciitis is a common condition that causes foot pain. It is soreness (inflammation) of the band of tough fibrous tissue on the bottom of the foot that runs from the heel bone (calcaneus) to the ball of the foot. The cause of this soreness may be from excessive standing, poor fitting shoes, running on hard surfaces, being overweight, having an abnormal walk, or overuse (this is common in runners) of the painful foot or feet. It is also common in aerobic exercise dancers and ballet dancers. SYMPTOMS  Most people with plantar fasciitis complain of:  Severe pain in the morning on the bottom of their foot especially when taking the first steps out of bed. This pain recedes after a few minutes of walking.  Severe pain is experienced also during walking  following a long period of inactivity.  Pain is worse when walking barefoot or up stairs DIAGNOSIS   Your caregiver will diagnose this condition by examining and feeling your foot.  Special tests such as X-rays of your foot, are usually not needed. PREVENTION   Consult a sports medicine professional before beginning a new exercise program.  Walking programs offer a good workout. With walking there is a lower chance of overuse injuries common to runners. There is less impact and less jarring of the joints.  Begin all new exercise programs slowly. If problems or pain develop, decrease the amount of time or distance until you are at a comfortable level.  Wear good shoes and replace them regularly.  Stretch your foot and the heel cords at the back of the ankle (Achilles tendon) both before and after exercise.  Run or exercise on even surfaces that are not hard. For example, asphalt is better than pavement.  Do not run barefoot on hard surfaces.  If using a treadmill, vary the incline.  Do not continue to workout if you have foot or joint   problems. Seek professional help if they do not improve. HOME CARE INSTRUCTIONS   Avoid activities that cause you pain until you recover.  Use ice or cold packs on the problem or painful areas after working out.  Only take over-the-counter or prescription medicines for pain, discomfort, or fever as directed by your caregiver.  Soft shoe inserts or athletic shoes with air or gel sole cushions may be helpful.  If problems continue or become more severe, consult a sports medicine caregiver or your own health care provider. Cortisone is a potent anti-inflammatory medication that may be injected into the painful area. You can discuss this treatment with your caregiver. MAKE SURE YOU:   Understand these instructions.  Will watch your condition.  Will get help right away if you are not doing well or get worse. Document Released: 12/14/2000 Document  Revised: 06/13/2011 Document Reviewed: 02/13/2008 [employer] Patient Information 2014 [employer].  

## 2013-01-23 NOTE — Progress Notes (Signed)
[name] presents today chief complaint painful heels bilateral right greater than left. She has a history of lupus. She states that they've been bothering her for the past couple of months.  Objective: Pulses remain palpable bilateral no calf pain bilateral pain on palpation medial calcaneal tubercle right and to a lesser degree left. Radiographic evaluation does demonstrate what appears to be plantar fasciitis.  Assessment: Plantar fasciitis.  Plan: We discussed etiology pathology conservative versus surgical therapies we discussed stretching exercises ice therapy shoe gear modifications. She will continue to utilize her orthotics. She will also purchased a new pair of more sturdy stable shoes. I injected the bilateral heels today with 20 mg of Kenalog and local anesthetic. I also wrote her a Sterapred Dosepak. All of her one month.

## 2013-02-18 ENCOUNTER — Ambulatory Visit (INDEPENDENT_AMBULATORY_CARE_PROVIDER_SITE_OTHER): Payer: Medicare Other | Admitting: Internal Medicine

## 2013-02-18 ENCOUNTER — Encounter: Payer: Self-pay | Admitting: Internal Medicine

## 2013-02-18 VITALS — BP 122/78 | HR 68 | Temp 99.2°F | Ht 68.0 in | Wt 232.8 lb

## 2013-02-18 DIAGNOSIS — G44009 Cluster headache syndrome, unspecified, not intractable: Secondary | ICD-10-CM

## 2013-02-18 DIAGNOSIS — R21 Rash and other nonspecific skin eruption: Secondary | ICD-10-CM

## 2013-02-18 MED ORDER — PREDNISONE 20 MG PO TABS: 20.0000 mg | ORAL_TABLET | Freq: Two times a day (BID) | ORAL | Status: DC

## 2013-02-18 MED ORDER — VERAPAMIL HCL ER 120 MG PO TBCR: 120.0000 mg | EXTENDED_RELEASE_TABLET | Freq: Two times a day (BID) | ORAL | Status: DC

## 2013-02-18 MED ORDER — MOMETASONE FUROATE 0.1 % EX OINT: TOPICAL_OINTMENT | Freq: Two times a day (BID) | CUTANEOUS | Status: DC

## 2013-02-18 NOTE — Progress Notes (Signed)
Pre visit review using our clinic review tool, if applicable. No additional management support is needed unless otherwise documented below in the visit note. 

## 2013-02-18 NOTE — Patient Instructions (Signed)
Please keep a diary of your headaches . Document  each occurrence on the calendar with notation of : #1 any prodrome ( any non headache symptom such as marked fatigue,visual changes, ,etc ) which precedes actual headache ; #2) severity on 1-10 scale; #3) any triggers ( food/ drink,enviromenntal or weather changes ,physical or emotional stress) in 8-12 hour period prior to the headache; & #4) response to any medications or other intervention. Please review "Headache" @ [facility] for additional information especially on Cluster Headache.

## 2013-02-18 NOTE — Progress Notes (Signed)
  Subjective:    Patient ID: [name], female    DOB: 03-Dec-1970, 42 y.o.   MRN: [id number]  HPI  Since seen 5 weeks ago her ear symptoms have improved after treatment by her ENT. She continues to have intermittent vertigo. She also describes pressure below her eyes and her jaws below the TMJ.  She's had intermittent throbbing headaches. These are typically in the morning. This has been associated with watering of the right eye and right nose without associated purulence.  When she blows her nose she describes a sensation of fire crackers popping in her ears.  Aggressive nasal hygiene including inhaled nasal steroids have not been of benefit. She's also been taking Zyrtec at bedtime      Review of Systems  Over the last week she's developed a pruritic rash over her elbows. There's been no new topical or  oral exposures. Over-the-counter cortisone has not been of benefit.     Objective:   Physical Exam Gen.:  well-nourished in appearance. Alert, appropriate and cooperative throughout exam.Appears younger than stated age  Head: Normocephalic without obvious abnormalities  Eyes: No corneal or conjunctival inflammation noted. Pupils equal round reactive to light and accommodation. Extraocular motion intact. FOV normal Ears: External  ear exam reveals no significant lesions or deformities. Canals with purplish residua .TMs normal. Nose: External nasal exam reveals no deformity or inflammation. Nasal mucosa are pink and moist. No lesions or exudates noted.  Mouth: Oral mucosa and oropharynx reveal no lesions or exudates. Teeth in good repair. Neck: No deformities, masses, or tenderness noted. Range of motion normal. Lungs: Normal respiratory effort; chest expands symmetrically. Lungs are clear to auscultation without rales, wheezes, or increased work of breathing. Heart: Normal rate and rhythm. Normal S1 and S2. No gallop, click, or rub. No murmur.                                 Musculoskeletal/extremities: No clubbing, cyanosis, edema, or significant extremity  deformity noted. Range of motion normal .Tone & strength normal. Hand joints normal . Fingernail health good. Neurologic: Alert and oriented x3. Deep tendon reflexes symmetrical and normal.  Gait normal  . Rhomberg & finger to nose       Skin: Bland,plaque-like , slightly erythematous irregular rash over the elbows and slightly over the right forearm. These areas blanch with pressure Lymph: No cervical, axillary lymphadenopathy present. Psych: Mood and affect are normal. Normally interactive                                                                                        Assessment & Plan:  #1 recurrent headaches with right-sided rhinitis and watery eye discharge. Cluster headaches suggested  #2 rash of the elbows which blanches. Vascular reaction suggestive  Plan: see orders

## 2013-02-20 ENCOUNTER — Ambulatory Visit (INDEPENDENT_AMBULATORY_CARE_PROVIDER_SITE_OTHER): Payer: Medicare Other | Admitting: Podiatry

## 2013-02-20 ENCOUNTER — Encounter: Payer: Self-pay | Admitting: Podiatry

## 2013-02-20 VITALS — BP 130/46 | HR 83 | Resp 16 | Ht 68.0 in | Wt 215.0 lb

## 2013-02-20 DIAGNOSIS — M722 Plantar fascial fibromatosis: Secondary | ICD-10-CM

## 2013-02-20 NOTE — Progress Notes (Signed)
[name] presents today with a chief complaint of plantar fasciitis bilateral she is requesting injections.  Objective: Pulses remain palpable bilateral she has pain to palpation to the medial continued tubercles bilaterally but decreased by approximately 50% from last visit.  Assessment: Plantar fasciitis bilateral.  Plan: Injected bilateral heels today the plantar fascial strapping applied.

## 2013-03-08 ENCOUNTER — Telehealth: Payer: Self-pay | Admitting: Internal Medicine

## 2013-03-08 DIAGNOSIS — R21 Rash and other nonspecific skin eruption: Secondary | ICD-10-CM

## 2013-03-08 MED ORDER — PREDNISONE 20 MG PO TABS: ORAL_TABLET | ORAL | Status: DC

## 2013-03-08 NOTE — Telephone Encounter (Signed)
Rec: prednisone 20 mg 1/2 tid with meals. Derm referral if not resolved

## 2013-03-08 NOTE — Telephone Encounter (Signed)
Patient called and states that she has seen dr hopper for a rash that is on her arms, legs and is now spreading all over. Also now her voice is raspy. Patient wanted to know if she needed to be referred out somewhere or do she need to come back in and see dr hopper.

## 2013-03-08 NOTE — Telephone Encounter (Signed)
Please advise 

## 2013-03-08 NOTE — Telephone Encounter (Signed)
LMOM informing the pt of Dr. Frederik Pear recommendation below.  New rx sent to the pharmacy by e-script and referral sent for Derm.//AB/CMA

## 2013-03-08 NOTE — Addendum Note (Signed)
Addended by: Verdie Shire on: 03/08/2013 05:44 PM   Modules accepted: Orders

## 2013-03-11 ENCOUNTER — Telehealth: Payer: Self-pay | Admitting: Internal Medicine

## 2013-03-11 NOTE — Telephone Encounter (Signed)
I can make Allergy consultation to determine optimal therapy; please inform me if she has a physician preference.

## 2013-03-11 NOTE — Telephone Encounter (Signed)
Patient states she is already established with Dr. Nita Sells for dermatology. She states she believes this was an allergic reaction. She was drinking aloe juice and since stopping, her symptoms are clearing up. She also states that she does not want to take prednisone because she does not like the way it makes her feel. Patient thinks she may need to see an allergist. Please advise.

## 2013-03-11 NOTE — Telephone Encounter (Signed)
Please advise 

## 2013-03-12 ENCOUNTER — Other Ambulatory Visit: Payer: Self-pay | Admitting: *Deleted

## 2013-03-12 DIAGNOSIS — R21 Rash and other nonspecific skin eruption: Secondary | ICD-10-CM

## 2013-03-12 NOTE — Telephone Encounter (Signed)
Allergy referral placed. Called patient and she stated that anywhere is fine.

## 2013-03-13 ENCOUNTER — Telehealth: Payer: Self-pay | Admitting: Family Medicine

## 2013-03-13 NOTE — Telephone Encounter (Signed)
Error. BC °

## 2013-03-20 ENCOUNTER — Ambulatory Visit: Payer: Medicare Other | Admitting: Podiatry

## 2013-03-21 ENCOUNTER — Ambulatory Visit (INDEPENDENT_AMBULATORY_CARE_PROVIDER_SITE_OTHER): Payer: Medicare Other | Admitting: Podiatry

## 2013-03-21 ENCOUNTER — Encounter: Payer: Self-pay | Admitting: Podiatry

## 2013-03-21 VITALS — BP 133/82 | HR 74

## 2013-03-21 DIAGNOSIS — M722 Plantar fascial fibromatosis: Secondary | ICD-10-CM

## 2013-03-21 NOTE — Progress Notes (Signed)
   Subjective:    Patient ID: [name], female    DOB: 12-Dec-1970, 42 y.o.   MRN: [id number]  HPI Comments: The knot on side is coming back the right foot , the side hurts on right , the left still hurts plantar, it s better than it was      Review of Systems     Objective:   Physical Exam: Vital signs are stable she is alert and oriented x3. Pulses remain strongly palpable bilateral. She has pain on palpation medial continued tubercles bilateral.        Assessment & Plan:  Assessment: Chronic intractable plantar fasciitis bilateral. Reinjected with her third dose of Kenalog to the bilateral heels today. She will continue conservative therapies including anti-inflammatories night splint. Continue the use of her brooks tennis shoes and her orthotics. Followup with her one month.

## 2013-04-02 ENCOUNTER — Other Ambulatory Visit: Payer: Self-pay | Admitting: *Deleted

## 2013-04-02 MED ORDER — ESTRADIOL 1 MG PO TABS: 1.5000 mg | ORAL_TABLET | Freq: Every day | ORAL | Status: DC

## 2013-04-07 ENCOUNTER — Encounter (HOSPITAL_COMMUNITY): Payer: Self-pay | Admitting: Emergency Medicine

## 2013-04-07 ENCOUNTER — Emergency Department (HOSPITAL_COMMUNITY)
Admission: EM | Admit: 2013-04-07 | Discharge: 2013-04-07 | Disposition: A | Discharge status: Home / Self Care | Payer: Medicare HMO | Attending: Emergency Medicine | Admitting: Emergency Medicine

## 2013-04-07 DIAGNOSIS — K529 Noninfective gastroenteritis and colitis, unspecified: Secondary | ICD-10-CM

## 2013-04-07 DIAGNOSIS — K5289 Other specified noninfective gastroenteritis and colitis: Secondary | ICD-10-CM | POA: Insufficient documentation

## 2013-04-07 DIAGNOSIS — F411 Generalized anxiety disorder: Secondary | ICD-10-CM | POA: Insufficient documentation

## 2013-04-07 DIAGNOSIS — Z87442 Personal history of urinary calculi: Secondary | ICD-10-CM | POA: Insufficient documentation

## 2013-04-07 DIAGNOSIS — R5381 Other malaise: Secondary | ICD-10-CM | POA: Insufficient documentation

## 2013-04-07 DIAGNOSIS — F329 Major depressive disorder, single episode, unspecified: Secondary | ICD-10-CM | POA: Insufficient documentation

## 2013-04-07 DIAGNOSIS — M1990 Unspecified osteoarthritis, unspecified site: Secondary | ICD-10-CM | POA: Insufficient documentation

## 2013-04-07 DIAGNOSIS — B349 Viral infection, unspecified: Secondary | ICD-10-CM

## 2013-04-07 DIAGNOSIS — Z8742 Personal history of other diseases of the female genital tract: Secondary | ICD-10-CM | POA: Insufficient documentation

## 2013-04-07 DIAGNOSIS — M329 Systemic lupus erythematosus, unspecified: Secondary | ICD-10-CM | POA: Insufficient documentation

## 2013-04-07 DIAGNOSIS — K219 Gastro-esophageal reflux disease without esophagitis: Secondary | ICD-10-CM | POA: Insufficient documentation

## 2013-04-07 DIAGNOSIS — B9789 Other viral agents as the cause of diseases classified elsewhere: Secondary | ICD-10-CM | POA: Insufficient documentation

## 2013-04-07 DIAGNOSIS — R5383 Other fatigue: Secondary | ICD-10-CM

## 2013-04-07 DIAGNOSIS — E039 Hypothyroidism, unspecified: Secondary | ICD-10-CM | POA: Insufficient documentation

## 2013-04-07 DIAGNOSIS — Z7982 Long term (current) use of aspirin: Secondary | ICD-10-CM | POA: Insufficient documentation

## 2013-04-07 DIAGNOSIS — Z791 Long term (current) use of non-steroidal anti-inflammatories (NSAID): Secondary | ICD-10-CM | POA: Insufficient documentation

## 2013-04-07 DIAGNOSIS — Z3202 Encounter for pregnancy test, result negative: Secondary | ICD-10-CM | POA: Insufficient documentation

## 2013-04-07 DIAGNOSIS — Z79899 Other long term (current) drug therapy: Secondary | ICD-10-CM | POA: Insufficient documentation

## 2013-04-07 DIAGNOSIS — F3289 Other specified depressive episodes: Secondary | ICD-10-CM | POA: Insufficient documentation

## 2013-04-07 DIAGNOSIS — G8929 Other chronic pain: Secondary | ICD-10-CM | POA: Insufficient documentation

## 2013-04-07 DIAGNOSIS — I10 Essential (primary) hypertension: Secondary | ICD-10-CM | POA: Insufficient documentation

## 2013-04-07 DIAGNOSIS — M2550 Pain in unspecified joint: Secondary | ICD-10-CM | POA: Insufficient documentation

## 2013-04-07 LAB — COMPREHENSIVE METABOLIC PANEL
ALBUMIN: 3.7 g/dL (ref 3.5–5.2)
ALT: 24 U/L (ref 0–35)
AST: 19 U/L (ref 0–37)
Alkaline Phosphatase: 102 U/L (ref 39–117)
BUN: 17 mg/dL (ref 6–23)
CALCIUM: 9.4 mg/dL (ref 8.4–10.5)
CO2: 27 mEq/L (ref 19–32)
Chloride: 99 mEq/L (ref 96–112)
Creatinine, Ser: 0.81 mg/dL (ref 0.50–1.10)
GFR calc non Af Amer: 88 mL/min — ABNORMAL LOW (ref >90)
GLUCOSE: 115 mg/dL — AB (ref 70–99)
POTASSIUM: 4.1 meq/L (ref 3.7–5.3)
Sodium: 139 mEq/L (ref 137–147)
TOTAL PROTEIN: 7.7 g/dL (ref 6.0–8.3)
Total Bilirubin: 0.5 mg/dL (ref 0.3–1.2)

## 2013-04-07 LAB — CBC WITH DIFFERENTIAL/PLATELET
Basophils Absolute: 0 10*3/uL (ref 0.0–0.1)
Basophils Relative: 0 % (ref 0–1)
EOS PCT: 0 % (ref 0–5)
Eosinophils Absolute: 0.1 10*3/uL (ref 0.0–0.7)
HCT: 45.4 % (ref 36.0–46.0)
HEMOGLOBIN: 15.8 g/dL — AB (ref 12.0–15.0)
LYMPHS PCT: 10 % — AB (ref 12–46)
Lymphs Abs: 1.4 10*3/uL (ref 0.7–4.0)
MCH: 28.8 pg (ref 26.0–34.0)
MCHC: 34.8 g/dL (ref 30.0–36.0)
MCV: 82.7 fL (ref 78.0–100.0)
MONOS PCT: 8 % (ref 3–12)
Monocytes Absolute: 1.1 10*3/uL — ABNORMAL HIGH (ref 0.1–1.0)
NEUTROS ABS: 11.4 10*3/uL — AB (ref 1.7–7.7)
Neutrophils Relative %: 81 % — ABNORMAL HIGH (ref 43–77)
Platelets: 255 10*3/uL (ref 150–400)
RBC: 5.49 MIL/uL — ABNORMAL HIGH (ref 3.87–5.11)
RDW: 13.1 % (ref 11.5–15.5)
WBC: 14 10*3/uL — ABNORMAL HIGH (ref 4.0–10.5)

## 2013-04-07 LAB — URINALYSIS, ROUTINE W REFLEX MICROSCOPIC
Glucose, UA: NEGATIVE mg/dL
Hgb urine dipstick: NEGATIVE
Ketones, ur: NEGATIVE mg/dL
Leukocytes, UA: NEGATIVE
Nitrite: NEGATIVE
PH: 6 (ref 5.0–8.0)
Protein, ur: 30 mg/dL — AB
SPECIFIC GRAVITY, URINE: 1.027 (ref 1.005–1.030)
Urobilinogen, UA: 0.2 mg/dL (ref 0.0–1.0)

## 2013-04-07 LAB — POCT PREGNANCY, URINE: Preg Test, Ur: NEGATIVE

## 2013-04-07 LAB — URINE MICROSCOPIC-ADD ON

## 2013-04-07 MED ORDER — SODIUM CHLORIDE 0.9 % IV BOLUS (SEPSIS)
1000.0000 mL | Freq: Once | INTRAVENOUS | Status: AC
  Administered 2013-04-07: 1000 mL via INTRAVENOUS

## 2013-04-07 MED ORDER — ONDANSETRON HCL 4 MG/2ML IJ SOLN
4.0000 mg | Freq: Once | INTRAMUSCULAR | Status: AC
  Administered 2013-04-07: 4 mg via INTRAVENOUS
  Filled 2013-04-07: qty 2

## 2013-04-07 MED ORDER — ONDANSETRON 4 MG PO TBDP: 4.0000 mg | ORAL_TABLET | Freq: Three times a day (TID) | ORAL | Status: DC | PRN

## 2013-04-07 MED ORDER — HYDROCODONE-ACETAMINOPHEN 5-325 MG PO TABS
1.0000 | ORAL_TABLET | Freq: Once | ORAL | Status: AC
  Administered 2013-04-07: 1 via ORAL
  Filled 2013-04-07: qty 1

## 2013-04-07 MED ORDER — DIPHENOXYLATE-ATROPINE 2.5-0.025 MG PO TABS: 1.0000 | ORAL_TABLET | Freq: Four times a day (QID) | ORAL | Status: DC | PRN

## 2013-04-07 MED ORDER — DIPHENOXYLATE-ATROPINE 2.5-0.025 MG PO TABS
2.0000 | ORAL_TABLET | Freq: Once | ORAL | Status: AC
  Administered 2013-04-07: 2 via ORAL
  Filled 2013-04-07: qty 2

## 2013-04-07 MED ORDER — MORPHINE SULFATE 4 MG/ML IJ SOLN
4.0000 mg | INTRAMUSCULAR | Status: DC | PRN
Start: 2013-04-07 — End: 2013-04-07

## 2013-04-07 NOTE — ED Notes (Signed)
Pt complains of n/v/d since Thursday. Pt also complains of generalized abd pain.

## 2013-04-07 NOTE — Discharge Instructions (Signed)
Viral Gastroenteritis Viral gastroenteritis is also called stomach flu. This illness is caused by a certain type of germ (virus). It can cause sudden watery poop (diarrhea) and throwing up (vomiting). This can cause you to lose body fluids (dehydration). This illness usually lasts for 3 to 8 days. It usually goes away on its own. HOME CARE   Drink enough fluids to keep your pee (urine) clear or pale yellow. Drink small amounts of fluids often.  Ask your doctor how to replace body fluid losses (rehydration).  Avoid:  Foods high in sugar.  Alcohol.  Bubbly (carbonated) drinks.  Tobacco.  Juice.  Caffeine drinks.  Very hot or cold fluids.  Fatty, greasy foods.  Eating too much at one time.  Dairy products until 24 to 48 hours after your watery poop stops.  You may eat foods with active cultures (probiotics). They can be found in some yogurts and supplements.  Wash your hands well to avoid spreading the illness.  Only take medicines as told by your doctor. Do not give aspirin to children. Do not take medicines for watery poop (antidiarrheals).  Ask your doctor if you should keep taking your regular medicines.  Keep all doctor visits as told. GET HELP RIGHT AWAY IF:   You cannot keep fluids down.  You do not pee at least once every 6 to 8 hours.  You are short of breath.  You see blood in your poop or throw up. This may look like coffee grounds.  You have belly (abdominal) pain that gets worse or is just in one small spot (localized).  You keep throwing up or having watery poop.  You have a fever.  The patient is a child younger than 3 months, and he or she has a fever.  The patient is a child older than 3 months, and he or she has a fever and problems that do not go away.  The patient is a child older than 3 months, and he or she has a fever and problems that suddenly get worse.  The patient is a baby, and he or she has no tears when crying. MAKE SURE YOU:     Understand these instructions.  Will watch your condition.  Will get help right away if you are not doing well or get worse. Document Released: 09/07/2007 Document Revised: 06/13/2011 Document Reviewed: 01/05/2011 [facility] Patient Information 2014 [place].

## 2013-04-07 NOTE — ED Provider Notes (Signed)
CSN: [id number]     Arrival date & time 04/07/13  1719 History   First MD Initiated Contact with Patient 04/07/13 1830     Chief Complaint  Patient presents with  . Emesis  . Diarrhea  . Abdominal Pain    HPI  Patient describes a four-day illness. Nausea vomiting diarrhea and intestinal cramping. No localizing abdominal pain. No fevers. Nonbilious emesis. No blood pus or mucus in the stools. Occasional cough not short of breath no fevers muscle aches.  Past Medical History  Diagnosis Date  . Hypothyroidism   . Hypertension     a  . Anxiety   . Depression     during divorce & legal matters  . Osteoarthritis   . GERD (gastroesophageal reflux disease)   . IBS (irritable bowel syndrome)   . Lupus hx positive ANA    followed by Dr Gavin Pound; possible lupus  . History of polycystic ovarian disease S/P BSO  . Polyarthritis, inflammatory   . Chronic joint pain   . Myalgia   . History of kidney stones   . Hx of anxiety disorder    Past Surgical History  Procedure Laterality Date  . Eye surgery      Retinal tears surgery bilateral  . Tonsillectomy    . Carpal tunnel release      bilateral; Dr Sherwood Gambler  . Plantar fascia surgery      right  . Plantar fascia surgery  02/2011    left  . Cystoscopy w/ ureteral stent placement  11/12/2011    Procedure: CYSTOSCOPY WITH RETROGRADE PYELOGRAM/URETERAL STENT PLACEMENT;  Surgeon: Alexis Frock, MD;  Location: [facility];  Service: Urology;  Laterality: Right;  . Laparoscopic cholecystectomy  05-31-2007  . Cysto/ right retrograde ureteral pyelogram  07-09-2004    HX BILATERAL RENAL STONES/ RIGHT FLANK PAIN  . Excision left bartholin gland  02-05-2002  . Laparoscopy with right salpingo-oophectomy/ lysis adhesions and ablation endometriosis  05-17-2001  . Right ureteroscopic stone extraction  08-04-2000  . Laparoscopic laser ablation endometriosis and left salpingo-oophorectomy  11-03-1999  . Laparoscopic assisted vaginal hysterectomy   2000  . Dilation and curettage of uterus      X5  . Ankle surgery      X3  . Appendectomy  1991    exploratory lap  . Lumbar laminectomy  2003    L4 - L5  . Ureteral stent placement  11/12/11    Dr Tresa Moore  . Cystoscopy w/ ureteral stent removal  12/07/2011    Procedure: CYSTOSCOPY WITH STENT REMOVAL;  Surgeon: Alexis Frock, MD;  Location: [facility];  Service: Urology;  Laterality: Right;  . Cystoscopy/retrograde/ureteroscopy/stone extraction with basket  12/07/2011    Procedure: CYSTOSCOPY/RETROGRADE/URETEROSCOPY/STONE EXTRACTION WITH BASKET;  Surgeon: Alexis Frock, MD;  Location: [facility];  Service: Urology;  Laterality: Right;  . Colonoscopy      in 1990s   Family History  Problem Relation Age of Onset  . Hypertension Mother   . Colon polyps Mother   . Atrial fibrillation Mother     Dr Angelena Form  . Heart attack Father 35  . Diabetes Father   . Hypertension Sister   . Heart attack Paternal Grandmother 10  . Breast cancer Paternal Aunt   . Colon cancer Maternal Uncle   . Colon cancer Paternal Uncle   . Colon polyps Maternal Grandmother   . Stroke Maternal Grandmother 76  . Diabetes Paternal Grandfather   . Stroke Maternal Grandfather 37  History  Substance Use Topics  . Smoking status: Never Smoker   . Smokeless tobacco: Never Used  . Alcohol Use: Yes     Comment:  very rarely   OB History   Grav Para Term Preterm Abortions TAB SAB Ect Mult Living                 Review of Systems  Constitutional: Negative for fever, chills, diaphoresis, appetite change and fatigue.  HENT: Negative for mouth sores, sore throat and trouble swallowing.   Eyes: Negative for visual disturbance.  Respiratory: Negative for cough, chest tightness, shortness of breath and wheezing.   Cardiovascular: Negative for chest pain.  Gastrointestinal: Positive for nausea, vomiting, abdominal pain and diarrhea. Negative for abdominal distention.  Endocrine:  Negative for polydipsia, polyphagia and polyuria.  Genitourinary: Negative for dysuria, frequency and hematuria.  Musculoskeletal: Negative for gait problem.  Skin: Negative for color change, pallor and rash.  Neurological: Positive for weakness. Negative for dizziness, syncope, light-headedness and headaches.  Hematological: Does not bruise/bleed easily.  Psychiatric/Behavioral: Negative for behavioral problems and confusion.    Allergies  Ivp dye; Nalbuphine; Septra; Sulfamethoxazole-trimethoprim; and Tylox  Home Medications   Current Outpatient Rx  Name  Route  Sig  Dispense  Refill  . aspirin 81 MG tablet   Oral   Take 81 mg by mouth daily.          . cholecalciferol (VITAMIN D) 1000 UNITS tablet   Oral   Take 1,000 Units by mouth daily.         [name] estradiol (ESTRACE) 1 MG tablet   Oral   Take 1.5 mg by mouth daily.         . fish oil-omega-3 fatty acids 1000 MG capsule   Oral   Take 1 g by mouth daily.         [name] glycopyrrolate (ROBINUL) 2 MG tablet   Oral   Take 2 mg by mouth daily.         . hydrochlorothiazide (MICROZIDE) 12.5 MG capsule   Oral   Take 1 capsule (12.5 mg total) by mouth daily.   90 capsule   3   . hydroxychloroquine (PLAQUENIL) 200 MG tablet   Oral   Take 200 mg by mouth 2 (two) times daily.          [name] levothyroxine (SYNTHROID, LEVOTHROID) 112 MCG tablet   Oral   Take 112 mcg by mouth every morning.         [name] lisinopril (PRINIVIL,ZESTRIL) 40 MG tablet   Oral   Take 1 tablet (40 mg total) by mouth daily.   90 tablet   3   . meloxicam (MOBIC) 7.5 MG tablet   Oral   Take 7.5 mg by mouth daily. TK'WI by Berna Bue         . omeprazole (PRILOSEC) 40 MG capsule   Oral   Take 1 capsule (40 mg total) by mouth every morning.   90 capsule   3   . potassium citrate (UROCIT-K) 10 MEQ (1080 MG) SR tablet   Oral   Take 20 mEq by mouth daily.         . diphenoxylate-atropine (LOMOTIL) 2.5-0.025 MG per tablet   Oral    Take 1 tablet by mouth 4 (four) times daily as needed for diarrhea or loose stools.   30 tablet   0   . ondansetron (ZOFRAN ODT) 4 MG disintegrating tablet   Oral   Take 1  tablet (4 mg total) by mouth every 8 (eight) hours as needed for nausea.   20 tablet   0    BP 120/84  Pulse 110  Temp(Src) 99.6 F (37.6 C) (Oral)  Resp 20  SpO2 99% Physical Exam  Constitutional: She is oriented to person, place, and time. [name]. No distress.  HENT:  Head: Normocephalic.  Eyes: Conjunctivae are normal. Pupils are equal, round, and reactive to light. No scleral icterus.  Neck: Normal range of motion. Neck supple. No thyromegaly present.  Cardiovascular: Normal rate and regular rhythm.  Exam reveals no gallop and no friction rub.   No murmur heard. Pulmonary/Chest: Effort normal and breath sounds normal. No respiratory distress. She has no wheezes. She has no rales.  Abdominal: Soft. Bowel sounds are normal. She exhibits no distension. There is no tenderness. There is no rebound.  Benign abdominal exam. No peritoneal irritation.  Musculoskeletal: Normal range of motion.  Neurological: She is alert and oriented to person, place, and time.  Skin: Skin is warm and dry. No rash noted.  Psychiatric: She has a normal mood and affect. Her behavior is normal.    ED Course  Procedures (including critical care time) Labs Review Labs Reviewed  COMPREHENSIVE METABOLIC PANEL - Abnormal; Notable for the following:    Glucose, Bld 115 (*)    GFR calc non Af Amer 88 (*)    All other components within normal limits  CBC WITH DIFFERENTIAL - Abnormal; Notable for the following:    WBC 14.0 (*)    RBC 5.49 (*)    Hemoglobin 15.8 (*)    Neutrophils Relative % 81 (*)    Neutro Abs 11.4 (*)    Lymphocytes Relative 10 (*)    Monocytes Absolute 1.1 (*)    All other components within normal limits  URINALYSIS, ROUTINE W REFLEX MICROSCOPIC - Abnormal; Notable for the  following:    Color, Urine AMBER (*)    APPearance CLOUDY (*)    Bilirubin Urine SMALL (*)    Protein, ur 30 (*)    All other components within normal limits  URINE MICROSCOPIC-ADD ON  POCT PREGNANCY, URINE   Imaging Review No results found.  EKG Interpretation   None       MDM   1. Gastroenteritis   2. Viral syndrome    Reassuring labs. When will be finishing up her IV fluids. She was symptomatically treated with Zofran Lomotil. Declined morphine. Given Vicodin.    Tanna Furry, MD 04/07/13 2026

## 2013-04-07 NOTE — ED Notes (Signed)
Bed: WA13 Expected date:  Expected time:  Means of arrival:  Comments: Hold  

## 2013-04-16 ENCOUNTER — Telehealth: Payer: Self-pay | Admitting: Cardiovascular Disease

## 2013-04-16 ENCOUNTER — Telehealth: Payer: Self-pay | Admitting: Gastroenterology

## 2013-04-16 ENCOUNTER — Telehealth: Payer: Self-pay | Admitting: *Deleted

## 2013-04-16 MED ORDER — OMEPRAZOLE 40 MG PO CPDR: 40.0000 mg | DELAYED_RELEASE_CAPSULE | Freq: Every morning | ORAL | Status: DC

## 2013-04-16 MED ORDER — GLYCOPYRROLATE 2 MG PO TABS
2.0000 mg | ORAL_TABLET | Freq: Two times a day (BID) | ORAL | Status: DC
Start: 2013-04-16 — End: 2014-02-03

## 2013-04-16 NOTE — Telephone Encounter (Signed)
Called patient and she provided fax number since one in our system of [facility] is incorrect. Prescriptions printed and faxed to [facility] [phone].

## 2013-04-16 NOTE — Telephone Encounter (Signed)
New message   All Rx fax to [facility].

## 2013-04-16 NOTE — Telephone Encounter (Signed)
Patient called and stated that she has switch to [facility] now and they use [employer]. Patient wanted to see if we could fax [facility] Rx's to [facility] no

## 2013-04-17 ENCOUNTER — Ambulatory Visit: Payer: Medicare HMO | Admitting: Podiatry

## 2013-04-18 ENCOUNTER — Other Ambulatory Visit: Payer: Self-pay

## 2013-04-18 DIAGNOSIS — I10 Essential (primary) hypertension: Secondary | ICD-10-CM

## 2013-04-18 MED ORDER — HYDROCHLOROTHIAZIDE 12.5 MG PO CAPS: 12.5000 mg | ORAL_CAPSULE | Freq: Every day | ORAL | Status: DC

## 2013-04-18 MED ORDER — POTASSIUM CITRATE ER 10 MEQ (1080 MG) PO TBCR: 20.0000 meq | EXTENDED_RELEASE_TABLET | Freq: Every day | ORAL | Status: DC

## 2013-04-18 MED ORDER — LISINOPRIL 40 MG PO TABS: 40.0000 mg | ORAL_TABLET | Freq: Every day | ORAL | Status: DC

## 2013-04-19 ENCOUNTER — Other Ambulatory Visit: Payer: Self-pay | Admitting: *Deleted

## 2013-04-19 ENCOUNTER — Telehealth: Payer: Self-pay | Admitting: Internal Medicine

## 2013-04-19 DIAGNOSIS — M064 Inflammatory polyarthropathy: Secondary | ICD-10-CM

## 2013-04-19 MED ORDER — LEVOTHYROXINE SODIUM 112 MCG PO TABS: 112.0000 ug | ORAL_TABLET | Freq: Every morning | ORAL | Status: DC

## 2013-04-19 NOTE — Telephone Encounter (Signed)
Patient's insurance has changed and she states that she needs a new referral sent to her rheumatologist Dr. Gavin Pound, at [facility] before her appointment with them on 05/02/2013. Fax#: [phone]

## 2013-04-19 NOTE — Telephone Encounter (Signed)
Spoke with the pt to see what med she needs to be sent to the pharmacy.  Pt stated that she needs her thyroid med refilled and sent to [facility].  Informed the pt that we have not seen her for an OV and she has not had any recent bloodwork done since 2012.  Informed her we will need to schedule her for a CPE and she needs bloodwork to check her thyroid level.  We need to make should she still on the right dose.  Pt understood and agreed.  She asked if we would sent in a 30 day supply to [facility] she will call back next week and schedule a CPE with Dr. Linna Darner.  Pt is aware that Dr. Linna Darner will be moving to [facility] and she would like to see him at [facility].  Rx sent to the pharmacy([facility]) by e-script.//AB/CMA

## 2013-04-19 NOTE — Telephone Encounter (Signed)
Referral entered into Epic, printed and faxed to [facility]. JG//CMA

## 2013-04-23 ENCOUNTER — Institutional Professional Consult (permissible substitution): Payer: Medicare Other | Admitting: Internal Medicine

## 2013-04-29 ENCOUNTER — Encounter: Payer: Self-pay | Admitting: Internal Medicine

## 2013-04-29 ENCOUNTER — Ambulatory Visit (INDEPENDENT_AMBULATORY_CARE_PROVIDER_SITE_OTHER): Payer: Medicare HMO | Admitting: Internal Medicine

## 2013-04-29 VITALS — BP 117/75 | HR 83 | Temp 98.4°F | Ht 67.25 in | Wt 223.8 lb

## 2013-04-29 DIAGNOSIS — R7309 Other abnormal glucose: Secondary | ICD-10-CM

## 2013-04-29 DIAGNOSIS — N200 Calculus of kidney: Secondary | ICD-10-CM | POA: Insufficient documentation

## 2013-04-29 DIAGNOSIS — M2550 Pain in unspecified joint: Secondary | ICD-10-CM | POA: Insufficient documentation

## 2013-04-29 DIAGNOSIS — I10 Essential (primary) hypertension: Secondary | ICD-10-CM

## 2013-04-29 DIAGNOSIS — Z0000 Encounter for general adult medical examination without abnormal findings: Secondary | ICD-10-CM

## 2013-04-29 DIAGNOSIS — E039 Hypothyroidism, unspecified: Secondary | ICD-10-CM

## 2013-04-29 LAB — HEMOGLOBIN A1C: Hgb A1c MFr Bld: 5.8 % (ref 4.6–6.5)

## 2013-04-29 LAB — TSH: TSH: 0.83 u[IU]/mL (ref 0.35–5.50)

## 2013-04-29 NOTE — Patient Instructions (Signed)
Your next office appointment will be determined based upon review of your pending labs . Those instructions will be transmitted to you through My Chart  or by mail if you're not using this system.   Minimal Blood Pressure Goal= AVERAGE < 140/90;  Ideal is an AVERAGE < 135/85. This AVERAGE should be calculated from @ least 5-7 BP readings taken @ different times of day on different days of week. You should not respond to isolated BP readings , but rather the AVERAGE for that week .Please bring your  blood pressure cuff to office visits to verify that it is reliable.It  can also be checked against the blood pressure device at the pharmacy. Finger or wrist cuffs are not dependable; an arm cuff is. 

## 2013-04-29 NOTE — Progress Notes (Signed)
Subjective:    Patient ID: [name], female    DOB: July 16, 1970, 43 y.o.   MRN: [id number]  HPI  Medicare Wellness Visit: Psychosocial and medical history were reviewed as required by Medicare (history related to abuse, antisocial behavior , firearm risk). Social history: Caffeine: 1 tea / day  , Alcohol:rarely  , Tobacco XBL:TJQZE Exercise:Gym 3 X/ week. Bike 60 min; water aerobics Personal safety/fall risk:no Limitations of activities of daily living:no Seatbelt/ smoke alarm use:yes Healthcare Power of Attorney/Living Will status: needed Ophthalmologic exam status:UTD due to Plaquenil Hearing evaluation status:not UTD Orientation: Oriented X3 Memory and recall: good Spelling  testing: good Depression/anxiety assessment: no Foreign travel history:never Immunization status for influenza/pneumonia/ shingles /tetanus: Shingles contraindicated Transfusion history:no Preventive health care maintenance status: Colonoscopy/BMD/mammogram/Pap as per protocol/standard care:UTD Dental care:every 12 mos Chart reviewed and updated. Active issues reviewed and addressed as documented below.    Review of Systems Blood pressure range 115-128/70-80  Compliant with anti hypertemsive medication. No lightheadedness or other adverse medication effect described.  Significant headaches, epistaxis, chest pain, palpitations, exertional dyspnea, claudication, paroxysmal nocturnal dyspnea, or edema absent.  She was seen in the emergency room 04/07/13 with gastroenteritis symptoms. Those labs were reviewed. GFR was minimally reduced at 88; but the creatinine was normal at 0.81. Her glucose was mildly elevated at 1:15. Additionally her white count was significantly elevated at 14. 2 with slight increase in neutrophils.     Objective:   Physical Exam  Gen.: Healthy and well-nourished in appearance. Alert, appropriate and cooperative throughout exam. Appears younger than stated age  Head: Normocephalic  without obvious abnormalities  Eyes: No corneal or conjunctival inflammation noted. Pupils equal round reactive to light and accommodation. Extraocular motion intact. Fundal exam is benign without hemorrhages, exudate, papilledema.  Vision grossly normal with lenses Ears: External  ear exam reveals no significant lesions or deformities. Canals clear .Gentian violet L canal.TMs normal. Hearing is grossly normal bilaterally. Nose: External nasal exam reveals no deformity or inflammation. Nasal mucosa are pink and moist. No lesions or exudates noted.  Mouth: Oral mucosa and oropharynx reveal no lesions or exudates. Teeth in good repair. Neck: No deformities, masses, or tenderness noted. Range of motion &Thyroid normal. Lungs: Normal respiratory effort; chest expands symmetrically. Lungs are clear to auscultation without rales, wheezes, or increased work of breathing. Heart: Normal rate and rhythm. Normal S1 and S2. No gallop, click, or rub. S4 w/o murmur. Abdomen: Bowel sounds normal; abdomen soft and nontender. No masses, organomegaly or hernias noted. Genitalia: as per Gyn                                  Musculoskeletal/extremities:  Accentuated curvature of upper thoracic spine. No clubbing, cyanosis, edema, or significant extremity  deformity noted. Range of motion normal .Tone & strength normal. Hand joints normal . Fingernail  health good. Able to lie down & sit up w/o help. Negative SLR bilaterally Vascular: Carotid, radial artery, dorsalis pedis and  posterior tibial pulses are full and equal. No bruits present. Neurologic: Alert and oriented x3. Deep tendon reflexes symmetrical and normal.       Skin: Intact without suspicious lesions or rashes. Lymph: No cervical, axillary lymphadenopathy present. Psych: Mood and affect are normal. Normally interactive  Assessment & Plan:  #1 Medicare Wellness  Exam; criteria met ; data entered #2 Problem List/Diagnoses reviewed Plan:  Assessments made/ Orders entered

## 2013-04-29 NOTE — Progress Notes (Signed)
Pre visit review using our clinic review tool, if applicable. No additional management support is needed unless otherwise documented below in the visit note. 

## 2013-04-30 ENCOUNTER — Telehealth: Payer: Self-pay | Admitting: Internal Medicine

## 2013-04-30 NOTE — Telephone Encounter (Signed)
Relevant patient education assigned to patient using Emmi. ° °

## 2013-05-02 ENCOUNTER — Ambulatory Visit (INDEPENDENT_AMBULATORY_CARE_PROVIDER_SITE_OTHER): Payer: Medicare HMO

## 2013-05-02 DIAGNOSIS — Z23 Encounter for immunization: Secondary | ICD-10-CM

## 2013-05-16 ENCOUNTER — Telehealth: Payer: Self-pay | Admitting: Internal Medicine

## 2013-05-16 NOTE — Telephone Encounter (Signed)
Per pt call requested a referral to [facility]: Hosie Spangle MD   Address: [address] [place], [place] [address]  [phone] pt has an appt scheduled for 05-28-2013 submitted the referral to [facility] faxed awaiting authorization

## 2013-06-12 ENCOUNTER — Other Ambulatory Visit (INDEPENDENT_AMBULATORY_CARE_PROVIDER_SITE_OTHER): Payer: Medicare HMO

## 2013-06-12 ENCOUNTER — Ambulatory Visit (INDEPENDENT_AMBULATORY_CARE_PROVIDER_SITE_OTHER): Payer: Medicare HMO | Admitting: Internal Medicine

## 2013-06-12 ENCOUNTER — Encounter: Payer: Self-pay | Admitting: Internal Medicine

## 2013-06-12 VITALS — BP 100/70 | HR 80 | Temp 97.6°F | Resp 15 | Wt 227.6 lb

## 2013-06-12 DIAGNOSIS — F410 Panic disorder [episodic paroxysmal anxiety] without agoraphobia: Secondary | ICD-10-CM

## 2013-06-12 DIAGNOSIS — R1011 Right upper quadrant pain: Secondary | ICD-10-CM

## 2013-06-12 DIAGNOSIS — R079 Chest pain, unspecified: Secondary | ICD-10-CM

## 2013-06-12 LAB — HEPATIC FUNCTION PANEL
ALT: 21 U/L (ref 0–35)
AST: 24 U/L (ref 0–37)
Albumin: 3.8 g/dL (ref 3.5–5.2)
Alkaline Phosphatase: 64 U/L (ref 39–117)
Bilirubin, Direct: 0.1 mg/dL (ref 0.0–0.3)
TOTAL PROTEIN: 7.2 g/dL (ref 6.0–8.3)
Total Bilirubin: 0.6 mg/dL (ref 0.3–1.2)

## 2013-06-12 LAB — CBC WITH DIFFERENTIAL/PLATELET
BASOS ABS: 0.1 10*3/uL (ref 0.0–0.1)
Basophils Relative: 1.3 % (ref 0.0–3.0)
Eosinophils Absolute: 0.1 10*3/uL (ref 0.0–0.7)
Eosinophils Relative: 1.4 % (ref 0.0–5.0)
HCT: 40.9 % (ref 36.0–46.0)
Hemoglobin: 13.5 g/dL (ref 12.0–15.0)
LYMPHS PCT: 27.8 % (ref 12.0–46.0)
Lymphs Abs: 2.4 10*3/uL (ref 0.7–4.0)
MCHC: 33.1 g/dL (ref 30.0–36.0)
MCV: 86.4 fl (ref 78.0–100.0)
MONOS PCT: 9 % (ref 3.0–12.0)
Monocytes Absolute: 0.8 10*3/uL (ref 0.1–1.0)
NEUTROS PCT: 60.5 % (ref 43.0–77.0)
Neutro Abs: 5.2 10*3/uL (ref 1.4–7.7)
PLATELETS: 241 10*3/uL (ref 150.0–400.0)
RBC: 4.73 Mil/uL (ref 3.87–5.11)
RDW: 13.5 % (ref 11.5–14.6)
WBC: 8.7 10*3/uL (ref 4.5–10.5)

## 2013-06-12 LAB — LIPASE: Lipase: 21 U/L (ref 11.0–59.0)

## 2013-06-12 LAB — AMYLASE: Amylase: 46 U/L (ref 27–131)

## 2013-06-12 MED ORDER — CITALOPRAM HYDROBROMIDE 20 MG PO TABS: 20.0000 mg | ORAL_TABLET | Freq: Every day | ORAL | Status: DC

## 2013-06-12 NOTE — Patient Instructions (Addendum)
Reflux of gastric acid may be asymptomatic as this may occur mainly during sleep.The triggers for reflux  include stress; the "aspirin family" ; alcohol; peppermint; and caffeine (coffee, tea, cola, and chocolate). The aspirin family would include aspirin and the nonsteroidal agents such as ibuprofen &  Naproxen. Tylenol would not cause reflux. If having symptoms ; food & drink should be avoided for @ least 2 hours before going to bed.  Omeprazole before breakfast and evening meal until your symptoms resolve.  Once the abdominal and chest pain resolve; resume omeprazole before breakfast only. At that point begin citalopram 20 mg each evening.

## 2013-06-12 NOTE — Progress Notes (Signed)
   Subjective:    Patient ID: [name], female    DOB: 02-Sep-1970, 43 y.o.   MRN: [id number]  HPI  She has had left upper chest pain  & RUQ pain lasting about 5 minutes at a time intermittently since 06/10/13 after eating a hot dog, chili & slaw. This is described as dull to sharp. It is nonradiating. It is not exertional and not associated with diaphoresis or nausea  She does get some relief with burping. This is associated with some sour taste. The symptoms recur despite taking omeprazole. She continues to have some dyspepsia   PMH of cholecystectomy. She has family history of coronary disease.    Review of Systems Stools loose - watery.  She does have upper extremity numbness for which she sees a neurosurgeon  She's had a dry cough recently. She's also had some purulent nasal discharge. This is been associated with some ear pain without any discharge. She questioned whether this was related to allergy.  She has had panic attacks. She is getting married in September.     Objective:   Physical Exam General appearance:good health ;well nourished; no acute distress or increased work of breathing is present.  No  lymphadenopathy about the head, neck, or axilla noted.   Eyes: No conjunctival inflammation or lid edema is present. There is no scleral icterus.  Ears:  External ear exam shows no significant lesions or deformities.  Otoscopic examination reveals clear canals, tympanic membranes are intact bilaterally without bulging, retraction, inflammation or discharge.  Nose:  External nasal examination shows no deformity or inflammation. Nasal mucosa are pink and moist without lesions or exudates. No septal dislocation or deviation.No obstruction to airflow.   Oral exam: Dental hygiene is good; lips and gums are healthy appearing.There is no oropharyngeal erythema or exudate noted.   Neck:  No deformities, thyromegaly, masses, or tenderness noted.   Supple with full range of motion  without pain.   Heart:  Normal rate and regular rhythm. S1 and S2 normal without gallop, murmur, click, rub or other extra sounds.   Lungs:Chest clear to auscultation; no wheezes, rhonchi,rales ,or rubs present.No increased work of breathing.    Extremities:  No cyanosis, edema, or clubbing  Noted.Homans  negative    Skin: Warm & dry w/o jaundice or tenting.         Assessment & Plan:  #1 atypical chest pain; the most likely etiology is reflux & possible esophageal spasm. This appeared to be related to a specific food trigger. Family history coronary disease #2 RUQ pain  #3 panic attacks in the context of stress  See orders

## 2013-06-12 NOTE — Progress Notes (Signed)
Pre visit review using our clinic review tool, if applicable. No additional management support is needed unless otherwise documented below in the visit note. 

## 2013-06-13 LAB — TROPONIN I

## 2013-06-25 ENCOUNTER — Telehealth: Payer: Self-pay | Admitting: Internal Medicine

## 2013-06-25 NOTE — Telephone Encounter (Signed)
Pt request refill for levothyroxine to be send to [facility] for 90 day supply. Please advise

## 2013-06-26 MED ORDER — LEVOTHYROXINE SODIUM 112 MCG PO TABS: 112.0000 ug | ORAL_TABLET | Freq: Every morning | ORAL | Status: DC

## 2013-06-26 NOTE — Telephone Encounter (Signed)
Rx sent to the pharmacy by e-script.//AB/CMA 

## 2013-07-04 ENCOUNTER — Other Ambulatory Visit: Payer: Self-pay | Admitting: *Deleted

## 2013-07-04 DIAGNOSIS — F410 Panic disorder [episodic paroxysmal anxiety] without agoraphobia: Secondary | ICD-10-CM

## 2013-07-04 MED ORDER — CITALOPRAM HYDROBROMIDE 20 MG PO TABS: 20.0000 mg | ORAL_TABLET | Freq: Every day | ORAL | Status: DC

## 2013-07-04 NOTE — Telephone Encounter (Signed)
Rx sent to the pharmacy by e-script.//AB/CMA 

## 2013-07-29 ENCOUNTER — Encounter: Payer: Self-pay | Admitting: Internal Medicine

## 2013-07-30 ENCOUNTER — Ambulatory Visit: Payer: Medicare HMO | Admitting: Cardiovascular Disease

## 2013-08-12 ENCOUNTER — Telehealth: Payer: Self-pay | Admitting: Internal Medicine

## 2013-08-12 NOTE — Telephone Encounter (Signed)
Referral for Dr Gavin Pound faxed to [facility] @ [phone]

## 2013-09-20 DIAGNOSIS — M79609 Pain in unspecified limb: Secondary | ICD-10-CM

## 2013-09-22 ENCOUNTER — Other Ambulatory Visit: Payer: Self-pay | Admitting: Gastroenterology

## 2013-09-22 ENCOUNTER — Other Ambulatory Visit: Payer: Self-pay | Admitting: Cardiovascular Disease

## 2013-09-23 NOTE — Telephone Encounter (Signed)
NEEDS OFFICE VISIT FOR ANY FURTHER REFILLS! 

## 2013-12-10 ENCOUNTER — Other Ambulatory Visit: Payer: Self-pay | Admitting: Internal Medicine

## 2013-12-10 ENCOUNTER — Other Ambulatory Visit: Payer: Self-pay | Admitting: Gastroenterology

## 2014-01-13 ENCOUNTER — Telehealth: Payer: Self-pay | Admitting: Internal Medicine

## 2014-01-15 ENCOUNTER — Telehealth: Payer: Self-pay | Admitting: Internal Medicine

## 2014-01-15 NOTE — Telephone Encounter (Signed)
Pt requesting referral to neurologist for nerve conduction study. Dr Berna Bue reccommended test for arthritic neck. Please advise.

## 2014-01-15 NOTE — Telephone Encounter (Signed)
Notified pt with md response. Made appt for tomorrow @ 10...[name]

## 2014-01-15 NOTE — Telephone Encounter (Signed)
   [employer] requires I see you & dictate referral note before they will cover the Neurology  referral for nerve conduction test under my name. If your rheumatologist can order the referral w/o me being involved that will be fine.

## 2014-01-16 ENCOUNTER — Encounter: Payer: Self-pay | Admitting: Internal Medicine

## 2014-01-16 ENCOUNTER — Ambulatory Visit (INDEPENDENT_AMBULATORY_CARE_PROVIDER_SITE_OTHER): Payer: Medicare HMO | Admitting: Internal Medicine

## 2014-01-16 VITALS — BP 134/80 | HR 67 | Temp 98.8°F | Resp 13 | Wt 239.2 lb

## 2014-01-16 DIAGNOSIS — G5621 Lesion of ulnar nerve, right upper limb: Secondary | ICD-10-CM

## 2014-01-16 DIAGNOSIS — G5620 Lesion of ulnar nerve, unspecified upper limb: Secondary | ICD-10-CM | POA: Insufficient documentation

## 2014-01-16 DIAGNOSIS — M797 Fibromyalgia: Secondary | ICD-10-CM | POA: Insufficient documentation

## 2014-01-16 DIAGNOSIS — M2550 Pain in unspecified joint: Secondary | ICD-10-CM

## 2014-01-16 NOTE — Progress Notes (Signed)
Pre visit review using our clinic review tool, if applicable. No additional management support is needed unless otherwise documented below in the visit note. 

## 2014-01-16 NOTE — Progress Notes (Signed)
   Subjective:    Patient ID: [name], female    DOB: 1970/04/08, 43 y.o.   MRN: [id number]  HPI   She has had pain, numbness, & tingling along the lateral aspect of the right upper extremity from mid upper arm to the fourth and fifth right fingers.  This has been associated with some weakness of the hand  Symptoms started in June of this year and were unresponsive to meloxicam and subsequently Celebrex from her Rheumatologist who treats her for fibromyalgia.No trigger or injury prior to onset of symptoms.  Rheumatology notes were reviewed.      Review of Systems   She has fibromyalgia as well as polyarthralgia. Her ANA had been positive but was negative upon repeat  She's also been treated for biceps tendinitis of the right.     Objective:   Physical Exam   Positive or pertinent findings include: There is resolving ecchymosis of the right upper extremity medially. There is weakness to opposition in testing strength in the right hand Deep tendon reflexes, tone, are normal. She has decreased range of motion of the cervical spine.   General appearance :adequately nourished; in no distress. Eyes: No conjunctival inflammation or scleral icterus is present. Heart:  Normal rate and regular rhythm. S1 and S2 normal without gallop, murmur, click, rub or other extra sounds  Lungs:Chest clear to auscultation; no wheezes, rhonchi,rales ,or rubs present.No increased work of breathing.  Vascular : all pulses equal ; no bruits present. Skin:Warm & dry.  Intact without suspicious lesions or rashes ; no jaundice or tenting Lymphatic: No lymphadenopathy is noted about the head, neck, axilla            Assessment & Plan:  #1 right ulnar neuropathy with weakness, pain, numbness and tingling  Plan: Neurology referral; EMG/nerve conduction test would be anticipated based on the chronicity of symptoms and lack of response to anti-inflammatory agents.

## 2014-01-16 NOTE — Patient Instructions (Signed)
The Neurology referral will be scheduled and you'll be notified of the time.Please call the Referral Co-Ordinator @ [phone] if you have not been notified of appointment time within 7-10 days. 

## 2014-01-29 ENCOUNTER — Telehealth: Payer: Self-pay | Admitting: Internal Medicine

## 2014-01-29 NOTE — Telephone Encounter (Signed)
Call pt @ home # [phone]

## 2014-01-29 NOTE — Telephone Encounter (Signed)
Per Diane, they have openings next week. Left message for patient to call office.

## 2014-01-29 NOTE — Telephone Encounter (Signed)
I have left msg for Diane, new patient coordinator at [facility], to see if they have any sooner appointments. Awaiting return call.

## 2014-01-29 NOTE — Telephone Encounter (Signed)
Dr. Linna Darner referred her to [facility] neurolog[facility].  They can not get her in until 11/ 24.  She is requesting a referral to a different neurology office in order to get in sooner.

## 2014-01-30 ENCOUNTER — Telehealth: Payer: Self-pay | Admitting: *Deleted

## 2014-01-30 NOTE — Telephone Encounter (Signed)
Patient called to cancel new patient appointment she got in earlier with someone else. Dr. Darrol Angel office notified.

## 2014-01-30 NOTE — Telephone Encounter (Signed)
Pt has appt w/[facility] on 02/03/14. She is aware.

## 2014-02-03 ENCOUNTER — Ambulatory Visit (INDEPENDENT_AMBULATORY_CARE_PROVIDER_SITE_OTHER): Payer: Medicare HMO | Admitting: Neurology

## 2014-02-03 ENCOUNTER — Encounter: Payer: Self-pay | Admitting: Neurology

## 2014-02-03 ENCOUNTER — Telehealth: Payer: Self-pay | Admitting: Neurology

## 2014-02-03 VITALS — BP 115/69 | HR 70 | Ht 68.0 in | Wt 237.0 lb

## 2014-02-03 DIAGNOSIS — M542 Cervicalgia: Secondary | ICD-10-CM

## 2014-02-03 DIAGNOSIS — G5621 Lesion of ulnar nerve, right upper limb: Secondary | ICD-10-CM

## 2014-02-03 MED ORDER — NORTRIPTYLINE HCL 10 MG PO CAPS: ORAL_CAPSULE | ORAL | Status: DC

## 2014-02-03 NOTE — Telephone Encounter (Signed)
Left message for patient regarding scheduling NCV/EMG test per Dr. Krista Blue.

## 2014-02-03 NOTE — Progress Notes (Signed)
PATIENT: [name] DOB: 04-17-1970  HISTORICAL  [name] is a 43 yo right-handed female, accompanied by her mother referred by her PCP Dr. [name] for evaluation of right neck pain,right hand fourth and fifth finger paresthesia  She had a past medical history of connective tissue disease, positive ANA, is under the care of rheumatologist Dr. Gavin Pound, taking Plaquenil 200 mg daily.  She also had PMHx of lumbar decompression surgery in the past for left lumbar radiculopathy, bilateral carpal tunnel release surgery, hypothyroidism, on supplement, has been on disability because of her multiple medical issues  Since August 2015, she began to notice right elbow tenderness, shooting discomfort to her right fourth, fifth fingers, also noticed right biceps area deep achy pain, right neck pain, sometimes spreading forward, headaches, over the past 2 months, her symptoms has getting worse, more frequent, also had weakness of her right hand, she also complains of right wrist pain, right hand joints pain,  She denies gait difficulty, chronic low back pain,  She is taking gabapentin 300 mg twice a day for her low back pain, left lumbar radicular pain   REVIEW OF SYSTEMS: Full 14 system review of systems performed and notable only for weight gain, fatigue, cough, easy bruising, feeling hot, joint pain, swelling, achy muscles, cramps, allergy, running nose, headaches, numbness, weakness, insomnia, restless leg, anxiety.  ALLERGIES: Allergies  Allergen Reactions  . Ivp Dye [Iodinated Diagnostic Agents] Rash and Other (See Comments)    Flushing, minor facial rash & dyspnea  . Nalbuphine Shortness Of Breath and Rash    Nubain caused respiratory distress & rash  . Septra [Sulfamethoxazole-Trimethoprim] Shortness Of Breath and Rash  . Sulfamethoxazole-Trimethoprim Rash    rash  . Tylox [Oxycodone-Acetaminophen] Rash    HOME MEDICATIONS: Current Outpatient  Prescriptions on File Prior to Visit  Medication Sig Dispense Refill  . albuterol (PROVENTIL HFA;VENTOLIN HFA) 108 (90 BASE) MCG/ACT inhaler Inhale 1-2 puffs into the lungs every 6 (six) hours as needed for wheezing or shortness of breath.    [name] aspirin 81 MG tablet Take 81 mg by mouth daily.     . cholecalciferol (VITAMIN D) 1000 UNITS tablet Take 1,000 Units by mouth daily.    . citalopram (CELEXA) 20 MG tablet TAKE 1 TABLET EVERY DAY 90 tablet 0  . estradiol (ESTRACE) 1 MG tablet Take 1.5 mg by mouth daily.    . fish oil-omega-3 fatty acids 1000 MG capsule Take 1 g by mouth daily.    . fluticasone-salmeterol (ADVAIR HFA) 115-21 MCG/ACT inhaler Inhale 2 puffs into the lungs as needed.    . hydrochlorothiazide (MICROZIDE) 12.5 MG capsule TAKE 1 CAPSULE EVERY DAY 90 capsule 1  . hydroxychloroquine (PLAQUENIL) 200 MG tablet Take 200 mg by mouth 2 (two) times daily.     [name] levothyroxine (SYNTHROID, LEVOTHROID) 112 MCG tablet Take 1 tablet (112 mcg total) by mouth every morning. 90 tablet 3  . lisinopril (PRINIVIL,ZESTRIL) 40 MG tablet TAKE 1 TABLET EVERY DAY 90 tablet 1  . omeprazole (PRILOSEC) 40 MG capsule TAKE 1 CAPSULE EVERY MORNING 90 capsule 0   No current facility-administered medications on file prior to visit.    PAST MEDICAL HISTORY: Past Medical History  Diagnosis Date  . Hypothyroidism   . Hypertension     a  . Anxiety   . Depression     during divorce & legal matters  . Osteoarthritis   . GERD (gastroesophageal reflux disease)   . IBS (  irritable bowel syndrome)   . Lupus hx positive ANA    followed by Dr Gavin Pound; possible lupus  . History of polycystic ovarian disease S/P BSO  . Polyarthritis, inflammatory   . Chronic joint pain   . Myalgia   . History of kidney stones     Dr Phebe Colla  . Hx of anxiety disorder     PAST SURGICAL HISTORY: Past Surgical History  Procedure Laterality Date  . Eye surgery      Retinal tears surgery bilateral  . Tonsillectomy      . Carpal tunnel release      bilateral; Dr Sherwood Gambler  . Plantar fascia surgery      right  . Plantar fascia surgery  02/2011    left  . Cystoscopy w/ ureteral stent placement  11/12/2011    Procedure: CYSTOSCOPY WITH RETROGRADE PYELOGRAM/URETERAL STENT PLACEMENT;  Surgeon: Alexis Frock, MD;  Location: [facility];  Service: Urology;  Laterality: Right;  . Laparoscopic cholecystectomy  05-31-2007  . Cysto/ right retrograde ureteral pyelogram  07-09-2004    HX BILATERAL RENAL STONES/ RIGHT FLANK PAIN  . Excision left bartholin gland  02-05-2002  . Laparoscopy with right salpingo-oophectomy/ lysis adhesions and ablation endometriosis  05-17-2001  . Right ureteroscopic stone extraction  08-04-2000  . Laparoscopic laser ablation endometriosis and left salpingo-oophorectomy  11-03-1999  . Laparoscopic assisted vaginal hysterectomy  2000  . Dilation and curettage of uterus      X5  . Ankle surgery      X3  . Appendectomy  1991    exploratory lap  . Lumbar laminectomy  2003    L4 - L5  . Ureteral stent placement  11/12/11    Dr Tresa Moore  . Cystoscopy w/ ureteral stent removal  12/07/2011    Procedure: CYSTOSCOPY WITH STENT REMOVAL;  Surgeon: Alexis Frock, MD;  Location: [facility];  Service: Urology;  Laterality: Right;  . Cystoscopy/retrograde/ureteroscopy/stone extraction with basket  12/07/2011    Procedure: CYSTOSCOPY/RETROGRADE/URETEROSCOPY/STONE EXTRACTION WITH BASKET;  Surgeon: Alexis Frock, MD;  Location: [facility];  Service: Urology;  Laterality: Right;  . Colonoscopy      in 1990s    FAMILY HISTORY: Family History  Problem Relation Age of Onset  . Hypertension Mother   . Colon polyps Mother   . Atrial fibrillation Mother     Dr Angelena Form  . Heart attack Father 42  . Diabetes Father   . Hypertension Sister   . Heart attack Paternal Grandmother 58  . Breast cancer Paternal Aunt   . Colon cancer Maternal Uncle   . Colon cancer Paternal Uncle   .  Colon polyps Maternal Grandmother   . Stroke Maternal Grandmother 76  . Diabetes Paternal Grandfather   . Stroke Maternal Grandfather 37    SOCIAL HISTORY:  History   Social History  . Marital Status: Married    Spouse Name: Darnelle Maffucci    Number of Children: 0  . Years of Education: college   Occupational History  . disabled    Social History Main Topics  . Smoking status: Never Smoker   . Smokeless tobacco: Never Used  . Alcohol Use: 0.6 oz/week    1 Shots of liquor per week     Comment:  very rarely  . Drug Use: No  . Sexual Activity: Not on file   Other Topics Concern  . Not on file   Social History Narrative   Patient Lives at home with her husband Darnelle Maffucci)  Disabled.   Education two years of college.   Right handed.   Caffeine coffee and sweet tea. Not daily.     PHYSICAL EXAM   Filed Vitals:   02/03/14 1023  BP: 115/69  Pulse: 70  Height: 5\' 8"  (1.727 m)  Weight: 237 lb (107.502 kg)    Not recorded      Body mass index is 36.04 kg/(m^2).   Generalized: In no acute distress  Neck: Supple, no carotid bruits   Cardiac: Regular rate rhythm  Pulmonary: Clear to auscultation bilaterally  Musculoskeletal: No deformity  Neurological examination  Mentation: Alert oriented to time, place, history taking, and causual conversation  Cranial nerve II-XII: Pupils were equal round reactive to light. Extraocular movements were full.  Visual field were full on confrontational test. Bilateral fundi were sharp.  Facial sensation and strength were normal. Hearing was intact to finger rubbing bilaterally. Uvula tongue midline.  Head turning and shoulder shrug and were normal and symmetric.Tongue protrusion into cheek strength was normal.  Motor: right elbow tenderness to touch upon deep palpation,  mild right finger abduction weakness,right elbow tenderness upon deep palpation  Sensory: Intact to fine touch, pinprick, preserved vibratory sensation, and  proprioception at toes, with exception of decreased light touch, at right fourth and fifth fingers,.  Coordination: Normal finger to nose, heel-to-shin bilaterally there was no truncal ataxia  Gait: Rising up from seated position without assistance, normal stance, without trunk ataxia, moderate stride, good arm swing, smooth turning, able to perform tiptoe, and heel walking without difficulty.   Romberg signs: Negative  Deep tendon reflexes: Brachioradialis 2/2, biceps 2/2, triceps 2/2, patellar 2/2, Achilles 2/2, plantar responses were flexor bilaterally.   DIAGNOSTIC DATA (LABS, IMAGING, TESTING) - I reviewed patient records, labs, notes, testing and imaging myself where available.  Lab Results  Component Value Date   WBC 8.7 06/12/2013   HGB 13.5 06/12/2013   HCT 40.9 06/12/2013   MCV 86.4 06/12/2013   PLT 241.0 06/12/2013      Component Value Date/Time   NA 139 04/07/2013 1800   K 4.1 04/07/2013 1800   CL 99 04/07/2013 1800   CO2 27 04/07/2013 1800   GLUCOSE 115* 04/07/2013 1800   BUN 17 04/07/2013 1800   CREATININE 0.81 04/07/2013 1800   CALCIUM 9.4 04/07/2013 1800   PROT 7.2 06/12/2013 1632   ALBUMIN 3.8 06/12/2013 1632   AST 24 06/12/2013 1632   ALT 21 06/12/2013 1632   ALKPHOS 64 06/12/2013 1632   BILITOT 0.6 06/12/2013 1632   GFRNONAA 88* 04/07/2013 1800   GFRAA >90 04/07/2013 1800   Lab Results  Component Value Date   CHOL 144 08/14/2012   HDL 53.90 08/14/2012   LDLCALC 72 08/14/2012   TRIG 90.0 08/14/2012   CHOLHDL 3 08/14/2012   Lab Results  Component Value Date   HGBA1C 5.8 04/29/2013   Lab Results  Component Value Date   CNOBSJGG83 662 06/30/2009   Lab Results  Component Value Date   TSH 0.83 04/29/2013      ASSESSMENT AND PLAN  [name] is a 43 y.o. female complains of  Right-sided neck pain, radiating pain to right shoulder, bicep region, right elbow tenderness, radiating pain to right fourth and fifth fingers,  1,  differentiation diagnosis including right cervical radiculopathy, superimposed the right ulnar neuropathy 2. Complete evaluation with MRI of cervical spine, EMG nerve conduction study 3. Keep gabapentin 300 mg twice a day, add on nortriptyline 10 mg, titrating to  20 mg every night   Marcial Pacas, M.D. Ph.D.  [facility] [address] [place], [place] [address] [phone]

## 2014-02-11 ENCOUNTER — Ambulatory Visit
Admission: RE | Admit: 2014-02-11 | Discharge: 2014-02-11 | Disposition: A | Discharge status: Home / Self Care | Payer: Medicare HMO | Source: Ambulatory Visit | Attending: Neurology | Admitting: Neurology

## 2014-02-11 ENCOUNTER — Ambulatory Visit (INDEPENDENT_AMBULATORY_CARE_PROVIDER_SITE_OTHER): Payer: Commercial Managed Care - HMO | Admitting: Neurology

## 2014-02-11 ENCOUNTER — Encounter (INDEPENDENT_AMBULATORY_CARE_PROVIDER_SITE_OTHER): Payer: Self-pay | Admitting: Radiology

## 2014-02-11 DIAGNOSIS — Z0289 Encounter for other administrative examinations: Secondary | ICD-10-CM

## 2014-02-11 DIAGNOSIS — M542 Cervicalgia: Secondary | ICD-10-CM

## 2014-02-11 DIAGNOSIS — G5621 Lesion of ulnar nerve, right upper limb: Secondary | ICD-10-CM

## 2014-02-11 NOTE — Progress Notes (Signed)
PATIENT: [name] DOB: 24-Jan-1971  HISTORICAL  [name] is a 43 yo right-handed female, accompanied by her mother referred by her PCP Dr. [name] for evaluation of right neck pain,right hand fourth and fifth finger paresthesia  She had a past medical history of connective tissue disease, positive ANA, is under the care of rheumatologist Dr. Gavin Pound, taking Plaquenil 200 mg daily.  She also had PMHx of lumbar decompression surgery in the past for left lumbar radiculopathy, bilateral carpal tunnel release surgery, hypothyroidism, on supplement, has been on disability because of her multiple medical issues  Since August 2015, she began to notice right elbow tenderness, shooting discomfort to her right fourth, fifth fingers, also noticed right biceps area deep achy pain, right neck pain, sometimes spreading forward, headaches, over the past 2 months, her symptoms has getting worse, more frequent, also had weakness of her right hand, she also complains of right wrist pain, right hand joints pain,  She denies gait difficulty, chronic low back pain,  She is taking gabapentin 300 mg twice a day for her low back pain, left lumbar radicular pain  UPDATE Nov 10th 2015: She returned for electrodiagnostic study today, which showed evidence of chronic right C5, C6 radiculopathy, there was no evidence of active denervation,  We also reviewed MRI of the cervical, which showed evidence of degenerative disc disease, most severe at C5 and 6, with moderate to severe right foraminal stenosis, mild to moderate left foraminal stenosis  She continues to complains of right-sided neck pain, radiating pain to her right shoulder, right biceps region, right arm weakness,  REVIEW OF SYSTEMS: Full 14 system review of systems performed and notable only for As above ALLERGIES: Allergies  Allergen Reactions  . Ivp Dye [Iodinated Diagnostic Agents] Rash and Other (See Comments)      Flushing, minor facial rash & dyspnea  . Nalbuphine Shortness Of Breath and Rash    Nubain caused respiratory distress & rash  . Septra [Sulfamethoxazole-Trimethoprim] Shortness Of Breath and Rash  . Sulfamethoxazole-Trimethoprim Rash    rash  . Tylox [Oxycodone-Acetaminophen] Rash    HOME MEDICATIONS: Current Outpatient Prescriptions on File Prior to Visit  Medication Sig Dispense Refill  . albuterol (PROVENTIL HFA;VENTOLIN HFA) 108 (90 BASE) MCG/ACT inhaler Inhale 1-2 puffs into the lungs every 6 (six) hours as needed for wheezing or shortness of breath.    [name] aspirin 81 MG tablet Take 81 mg by mouth daily.     . celecoxib (CELEBREX) 200 MG capsule Take 200 capsules by mouth daily.    . cholecalciferol (VITAMIN D) 1000 UNITS tablet Take 1,000 Units by mouth daily.    . citalopram (CELEXA) 20 MG tablet TAKE 1 TABLET EVERY DAY 90 tablet 0  . estradiol (ESTRACE) 1 MG tablet Take 1.5 mg by mouth daily.    . fish oil-omega-3 fatty acids 1000 MG capsule Take 1 g by mouth daily.    [name] FLUARIX QUADRIVALENT 0.5 ML injection   0  . fluticasone-salmeterol (ADVAIR HFA) 115-21 MCG/ACT inhaler Inhale 2 puffs into the lungs as needed.    . gabapentin (NEURONTIN) 300 MG capsule Take 300 mg by mouth daily.    . hydrochlorothiazide (MICROZIDE) 12.5 MG capsule TAKE 1 CAPSULE EVERY DAY 90 capsule 1  . hydroxychloroquine (PLAQUENIL) 200 MG tablet Take 200 mg by mouth 2 (two) times daily.     [name] levothyroxine (SYNTHROID, LEVOTHROID) 112 MCG tablet Take 1 tablet (112 mcg total) by mouth every  morning. 90 tablet 3  . lisinopril (PRINIVIL,ZESTRIL) 40 MG tablet TAKE 1 TABLET EVERY DAY 90 tablet 1  . nortriptyline (PAMELOR) 10 MG capsule One po qhs xone week, then 2 tabs po qhs 60 capsule 6  . omeprazole (PRILOSEC) 40 MG capsule TAKE 1 CAPSULE EVERY MORNING 90 capsule 0   No current facility-administered medications on file prior to visit.    PAST MEDICAL HISTORY: Past Medical History  Diagnosis Date  .  Hypothyroidism   . Hypertension     a  . Anxiety   . Depression     during divorce & legal matters  . Osteoarthritis   . GERD (gastroesophageal reflux disease)   . IBS (irritable bowel syndrome)   . Lupus hx positive ANA    followed by Dr Gavin Pound; possible lupus  . History of polycystic ovarian disease S/P BSO  . Polyarthritis, inflammatory   . Chronic joint pain   . Myalgia   . History of kidney stones     Dr Phebe Colla  . Hx of anxiety disorder     PAST SURGICAL HISTORY: Past Surgical History  Procedure Laterality Date  . Eye surgery      Retinal tears surgery bilateral  . Tonsillectomy    . Carpal tunnel release      bilateral; Dr Sherwood Gambler  . Plantar fascia surgery      right  . Plantar fascia surgery  02/2011    left  . Cystoscopy w/ ureteral stent placement  11/12/2011    Procedure: CYSTOSCOPY WITH RETROGRADE PYELOGRAM/URETERAL STENT PLACEMENT;  Surgeon: Alexis Frock, MD;  Location: [facility];  Service: Urology;  Laterality: Right;  . Laparoscopic cholecystectomy  05-31-2007  . Cysto/ right retrograde ureteral pyelogram  07-09-2004    HX BILATERAL RENAL STONES/ RIGHT FLANK PAIN  . Excision left bartholin gland  02-05-2002  . Laparoscopy with right salpingo-oophectomy/ lysis adhesions and ablation endometriosis  05-17-2001  . Right ureteroscopic stone extraction  08-04-2000  . Laparoscopic laser ablation endometriosis and left salpingo-oophorectomy  11-03-1999  . Laparoscopic assisted vaginal hysterectomy  2000  . Dilation and curettage of uterus      X5  . Ankle surgery      X3  . Appendectomy  1991    exploratory lap  . Lumbar laminectomy  2003    L4 - L5  . Ureteral stent placement  11/12/11    Dr Tresa Moore  . Cystoscopy w/ ureteral stent removal  12/07/2011    Procedure: CYSTOSCOPY WITH STENT REMOVAL;  Surgeon: Alexis Frock, MD;  Location: [facility];  Service: Urology;  Laterality: Right;  . Cystoscopy/retrograde/ureteroscopy/stone  extraction with basket  12/07/2011    Procedure: CYSTOSCOPY/RETROGRADE/URETEROSCOPY/STONE EXTRACTION WITH BASKET;  Surgeon: Alexis Frock, MD;  Location: [facility];  Service: Urology;  Laterality: Right;  . Colonoscopy      in 1990s    FAMILY HISTORY: Family History  Problem Relation Age of Onset  . Hypertension Mother   . Colon polyps Mother   . Atrial fibrillation Mother     Dr Angelena Form  . Heart attack Father 59  . Diabetes Father   . Hypertension Sister   . Heart attack Paternal Grandmother 75  . Breast cancer Paternal Aunt   . Colon cancer Maternal Uncle   . Colon cancer Paternal Uncle   . Colon polyps Maternal Grandmother   . Stroke Maternal Grandmother 76  . Diabetes Paternal Grandfather   . Stroke Maternal Grandfather 37    SOCIAL  HISTORY:  History   Social History  . Marital Status: Married    Spouse Name: Darnelle Maffucci    Number of Children: 0  . Years of Education: college   Occupational History  . disabled    Social History Main Topics  . Smoking status: Never Smoker   . Smokeless tobacco: Never Used  . Alcohol Use: 0.6 oz/week    1 Shots of liquor per week     Comment:  very rarely  . Drug Use: No  . Sexual Activity: Not on file   Other Topics Concern  . Not on file   Social History Narrative   Patient Lives at home with her husband Darnelle Maffucci)   Disabled.   Education two years of college.   Right handed.   Caffeine coffee and sweet tea. Not daily.     PHYSICAL EXAM   There were no vitals filed for this visit.  Not recorded      There is no weight on file to calculate BMI.   Generalized: In no acute distress  Neck: Supple, no carotid bruits   Cardiac: Regular rate rhythm  Pulmonary: Clear to auscultation bilaterally  Musculoskeletal: No deformity  Neurological examination  Mentation: Alert oriented to time, place, history taking, and causual conversation  Cranial nerve II-XII: Pupils were equal round reactive to  light. Extraocular movements were full.  Visual field were full on confrontational test. Bilateral fundi were sharp.  Facial sensation and strength were normal. Hearing was intact to finger rubbing bilaterally. Uvula tongue midline.  Head turning and shoulder shrug and were normal and symmetric.Tongue protrusion into cheek strength was normal.  Motor: there was no limited range of motion of right shoulder, but she has mild right shoulder abduction, external rotation, elbow flexion weakness  Sensory: Intact to fine touch, pinprick, preserved vibratory sensation, and proprioception at toes, with exception of decreased light touch, at right fourth and fifth fingers,.  Coordination: Normal finger to nose, heel-to-shin bilaterally there was no truncal ataxia  Gait: Rising up from seated position without assistance, normal stance, without trunk ataxia, moderate stride, good arm swing, smooth turning, able to perform tiptoe, and heel walking without difficulty.   Romberg signs: Negative  Deep tendon reflexes: Brachioradialis 2/2, biceps 2/2, triceps 2/2, patellar 2/2, Achilles 2/2, plantar responses were flexor bilaterally.   DIAGNOSTIC DATA (LABS, IMAGING, TESTING) - I reviewed patient records, labs, notes, testing and imaging myself where available.  Lab Results  Component Value Date   WBC 8.7 06/12/2013   HGB 13.5 06/12/2013   HCT 40.9 06/12/2013   MCV 86.4 06/12/2013   PLT 241.0 06/12/2013      Component Value Date/Time   NA 139 04/07/2013 1800   K 4.1 04/07/2013 1800   CL 99 04/07/2013 1800   CO2 27 04/07/2013 1800   GLUCOSE 115* 04/07/2013 1800   BUN 17 04/07/2013 1800   CREATININE 0.81 04/07/2013 1800   CALCIUM 9.4 04/07/2013 1800   PROT 7.2 06/12/2013 1632   ALBUMIN 3.8 06/12/2013 1632   AST 24 06/12/2013 1632   ALT 21 06/12/2013 1632   ALKPHOS 64 06/12/2013 1632   BILITOT 0.6 06/12/2013 1632   GFRNONAA 88* 04/07/2013 1800   GFRAA >90 04/07/2013 1800   Lab Results    Component Value Date   CHOL 144 08/14/2012   HDL 53.90 08/14/2012   LDLCALC 72 08/14/2012   TRIG 90.0 08/14/2012   CHOLHDL 3 08/14/2012   Lab Results  Component Value Date   HGBA1C 5.8  04/29/2013   Lab Results  Component Value Date   VAPOLIDC30 131 06/30/2009   Lab Results  Component Value Date   TSH 0.83 04/29/2013      ASSESSMENT AND PLAN  [name] is a 42 y.o. female complains of  right-sided neck pain, radiating pain to right shoulder, bicep region, right elbow tenderness, radiating pain to right fourth and fifth fingers, today's EMG and MRI findings support a diagnosis of right cervical radiculopathy, mainly involving right C5, 6 myotome.  1. Keep nortriptyline every night. 2. Refer her to physical therpy. 3. Pain management 4. RTC in one month       Marcial Pacas, M.D. Ph.D.  [facility] [address], [place], [place] [address] [phone]

## 2014-02-11 NOTE — Procedures (Signed)
   NCS (NERVE CONDUCTION STUDY) WITH EMG (ELECTROMYOGRAPHY) REPORT   STUDY DATE: February 11 2014 PATIENT NAME: [name] DOB: 06/09/1970 MRN: [id number]    TECHNOLOGIST: Towana Badger ELECTROMYOGRAPHER: Marcial Pacas M.D.  CLINICAL INFORMATION:  44 years old right-handed female, with previous history of lumbar decompression surgery, presenting with right-sided neck pain, radiating pain to right shoulder, biceps region, right fourth and fifth finger paresthesia since August 2015  On examination: She has mild right shoulder abduction, external rotation weakness, deep tendon reflexes were present and symmetric.  FINDINGS: NERVE CONDUCTION STUDY: Right median, ulnar, radial sensory, and motor responses were normal.  NEEDLE ELECTROMYOGRAPHY: Selected needle examination was performed at right upper extremity muscles,and right cervical paraspinal muscles.  Right biceps, deltoid, brachioradialis: Normally insertion activity, no spontaneous activity, mildly enlarged complex motor unit potential, with mildly decreased recruitment patterns.  Right pronator teres: normally insertion activity, no spontaneous activity, mildly enlarged motor unit potential, with mildly decreased recruitment patterns.  Needle examination of right triceps, and right first dorsal interossei was normal  There was no spontaneous activity at right cervical paraspinal muscles, right C5, C6,  IMPRESSION:  This is a mild abnormal study. There was electrodiagnostic evidence of right chronic cervical radiculopathy, mainly involving right C5, C6. There was no evidence of active process   INTERPRETING PHYSICIAN:   Marcial Pacas M.D. Ph.D. [facility] [address] [place], [place] [address] [phone]

## 2014-02-18 ENCOUNTER — Telehealth: Payer: Self-pay | Admitting: Neurology

## 2014-02-18 NOTE — Telephone Encounter (Signed)
Patient calling to state that she has been taking Tylenol for her pain which has not been working, is wondering if there is anything else she can take since she cannot see the pain management doctor until January, please return call and advise.

## 2014-02-18 NOTE — Telephone Encounter (Signed)
[name]: I have called patient, left message,  Please move up her appointment in next 1-2 days to discuss the MRI findings, and her question about medications

## 2014-02-19 NOTE — Telephone Encounter (Signed)
Called and spoke to patient she is coming in 02-20-2014 to seen Dr. Krista Blue.

## 2014-02-20 ENCOUNTER — Ambulatory Visit (INDEPENDENT_AMBULATORY_CARE_PROVIDER_SITE_OTHER): Payer: Medicare HMO | Admitting: Neurology

## 2014-02-20 ENCOUNTER — Encounter: Payer: Self-pay | Admitting: Neurology

## 2014-02-20 VITALS — BP 124/80 | HR 69 | Ht 68.0 in | Wt 237.0 lb

## 2014-02-20 DIAGNOSIS — M5010 Cervical disc disorder with radiculopathy, unspecified cervical region: Secondary | ICD-10-CM

## 2014-02-20 MED ORDER — HYDROCODONE-ACETAMINOPHEN 5-325 MG PO TABS: 1.0000 | ORAL_TABLET | Freq: Two times a day (BID) | ORAL | Status: DC | PRN

## 2014-02-20 MED ORDER — GABAPENTIN 300 MG PO CAPS: 600.0000 mg | ORAL_CAPSULE | Freq: Three times a day (TID) | ORAL | Status: DC

## 2014-02-20 NOTE — Progress Notes (Signed)
PATIENT: Lainey Nelson DOB: [birth year]  HISTORICAL  Gurneet Matarese Stump Kreiger is a 43 yo right-handed female, accompanied by her mother referred by her PCP Dr. [name] for evaluation of right neck pain,right hand fourth and fifth finger paresthesia  She had a past medical history of connective tissue disease, positive ANA, is under the care of rheumatologist Dr. Gavin Pound, taking Plaquenil 200 mg daily.  She also had PMHx of lumbar decompression surgery in the past for left lumbar radiculopathy, bilateral carpal tunnel release surgery, hypothyroidism, on supplement, has been on disability because of her multiple medical issues  Since August 2015, she began to notice right elbow tenderness, shooting discomfort to her right fourth, fifth fingers, also noticed right biceps area deep achy pain, right neck pain, sometimes spreading forward, headaches, over the past 2 months, her symptoms has getting worse, more frequent, also had weakness of her right hand, she also complains of right wrist pain, right hand joints pain,  She denies gait difficulty, chronic low back pain,  She is taking gabapentin 300 mg twice a day for her low back pain, left lumbar radicular pain  UPDATE Nov 19th 2015: Electrodiagnostic study showed chronic mild right C5-6 radiculopathy, we have reviewed MRI cervical spine showed1. At C5-6: disc bulging, degenerative spondylosis, uncovertebral joint hypertrophy, with severe right foraminal stenosis; potential impingement upon the right C6 root. At C7-T1: uncovertebral joint hypertrophy with mild right foraminal stenosis.  She could not tolerate nortriptyline 10 mg every night, complains of irritations, she is also taking Celebrex 200 mg every day, gabapentin 300 mg daily,     REVIEW OF SYSTEMS: Full 14 system review of systems performed and notable only for weight gain, fatigue, cough, easy bruising, feeling hot, joint pain, swelling, achy muscles, cramps,  allergy, running nose, headaches, numbness, weakness, insomnia, restless leg, anxiety.  ALLERGIES: Allergies  Allergen Reactions  . Ivp Dye [Iodinated Diagnostic Agents] Rash and Other (See Comments)    Flushing, minor facial rash & dyspnea  . Nalbuphine Shortness Of Breath and Rash    Nubain caused respiratory distress & rash  . Septra [Sulfamethoxazole-Trimethoprim] Shortness Of Breath and Rash  . Sulfamethoxazole-Trimethoprim Rash    rash  . Tylox [Oxycodone-Acetaminophen] Rash    HOME MEDICATIONS: Current Outpatient Prescriptions on File Prior to Visit  Medication Sig Dispense Refill  . albuterol (PROVENTIL HFA;VENTOLIN HFA) 108 (90 BASE) MCG/ACT inhaler Inhale 1-2 puffs into the lungs every 6 (six) hours as needed for wheezing or shortness of breath.    [name] aspirin 81 MG tablet Take 81 mg by mouth daily.     . celecoxib (CELEBREX) 200 MG capsule Take 200 capsules by mouth daily.    . cholecalciferol (VITAMIN D) 1000 UNITS tablet Take 1,000 Units by mouth daily.    . citalopram (CELEXA) 20 MG tablet TAKE 1 TABLET EVERY DAY 90 tablet 0  . estradiol (ESTRACE) 1 MG tablet Take 1.5 mg by mouth daily.    . fish oil-omega-3 fatty acids 1000 MG capsule Take 1 g by mouth daily.    [name] gabapentin (NEURONTIN) 300 MG capsule Take 300 mg by mouth daily.    . hydrochlorothiazide (MICROZIDE) 12.5 MG capsule TAKE 1 CAPSULE EVERY DAY 90 capsule 1  . hydroxychloroquine (PLAQUENIL) 200 MG tablet Take 200 mg by mouth 2 (two) times daily.     [name] levothyroxine (SYNTHROID, LEVOTHROID) 112 MCG tablet Take 1 tablet (112 mcg total) by mouth every morning. 90 tablet 3  . lisinopril (PRINIVIL,ZESTRIL)  40 MG tablet TAKE 1 TABLET EVERY DAY 90 tablet 1  . nortriptyline (PAMELOR) 10 MG capsule One po qhs xone week, then 2 tabs po qhs 60 capsule 6  . omeprazole (PRILOSEC) 40 MG capsule TAKE 1 CAPSULE EVERY MORNING 90 capsule 0  . fluticasone-salmeterol (ADVAIR HFA) 115-21 MCG/ACT inhaler Inhale 2 puffs into the lungs  as needed.     No current facility-administered medications on file prior to visit.    PAST MEDICAL HISTORY: Past Medical History  Diagnosis Date  . Hypothyroidism   . Hypertension     a  . Anxiety   . Depression     during divorce & legal matters  . Osteoarthritis   . GERD (gastroesophageal reflux disease)   . IBS (irritable bowel syndrome)   . Lupus hx positive ANA    followed by Dr Gavin Pound; possible lupus  . History of polycystic ovarian disease S/P BSO  . Polyarthritis, inflammatory   . Chronic joint pain   . Myalgia   . History of kidney stones     Dr Phebe Colla  . Hx of anxiety disorder     PAST SURGICAL HISTORY: Past Surgical History  Procedure Laterality Date  . Eye surgery      Retinal tears surgery bilateral  . Tonsillectomy    . Carpal tunnel release      bilateral; Dr Sherwood Gambler  . Plantar fascia surgery      right  . Plantar fascia surgery  02/2011    left  . Cystoscopy w/ ureteral stent placement  11/12/2011    Procedure: CYSTOSCOPY WITH RETROGRADE PYELOGRAM/URETERAL STENT PLACEMENT;  Surgeon: Alexis Frock, MD;  Location: [facility];  Service: Urology;  Laterality: Right;  . Laparoscopic cholecystectomy  05-31-2007  . Cysto/ right retrograde ureteral pyelogram  07-09-2004    HX BILATERAL RENAL STONES/ RIGHT FLANK PAIN  . Excision left bartholin gland  02-05-2002  . Laparoscopy with right salpingo-oophectomy/ lysis adhesions and ablation endometriosis  05-17-2001  . Right ureteroscopic stone extraction  08-04-2000  . Laparoscopic laser ablation endometriosis and left salpingo-oophorectomy  11-03-1999  . Laparoscopic assisted vaginal hysterectomy  2000  . Dilation and curettage of uterus      X5  . Ankle surgery      X3  . Appendectomy  1991    exploratory lap  . Lumbar laminectomy  2003    L4 - L5  . Ureteral stent placement  11/12/11    Dr Tresa Moore  . Cystoscopy w/ ureteral stent removal  12/07/2011    Procedure: CYSTOSCOPY WITH STENT REMOVAL;   Surgeon: Alexis Frock, MD;  Location[facility];  Service: Urology;  Laterality: Right;  . Cystoscopy/retrograde/ureteroscopy/stone extraction with basket  12/07/2011    Procedure: CYSTOSCOPY/RETROGRADE/URETEROSCOPY/STONE EXTRACTION WITH BASKET;  Surgeon: Alexis Frock, MD;  Location: [facility];  Service: Urology;  Laterality: Right;  . Colonoscopy      in 1990s    FAMILY HISTORY: Family History  Problem Relation Age of Onset  . Hypertension Mother   . Colon polyps Mother   . Atrial fibrillation Mother     Dr Angelena Form  . Heart attack Father 70  . Diabetes Father   . Hypertension Sister   . Heart attack Paternal Grandmother 108  . Breast cancer Paternal Aunt   . Colon cancer Maternal Uncle   . Colon cancer Paternal Uncle   . Colon polyps Maternal Grandmother   . Stroke Maternal Grandmother 76  . Diabetes Paternal Grandfather   .  Stroke Maternal Grandfather 37    SOCIAL HISTORY:  History   Social History  . Marital Status: Married    Spouse Name: Darnelle Maffucci    Number of Children: 0  . Years of Education: college   Occupational History  . disabled    Social History Main Topics  . Smoking status: Never Smoker   . Smokeless tobacco: Never Used  . Alcohol Use: 0.6 oz/week    1 Shots of liquor per week     Comment:  very rarely  . Drug Use: No  . Sexual Activity: Not on file   Other Topics Concern  . Not on file   Social History Narrative   Patient Lives at home with her husband Darnelle Maffucci)   Disabled.   Education two years of college.   Right handed.   Caffeine coffee and sweet tea. Not daily.     PHYSICAL EXAM   Filed Vitals:   02/20/14 0920  BP: 124/80  Pulse: 69  Height: 5\' 8"  (1.727 m)  Weight: 237 lb (107.502 kg)    Not recorded      Body mass index is 36.04 kg/(m^2).   Generalized: In no acute distress  Neck: Supple, no carotid bruits   Cardiac: Regular rate rhythm  Pulmonary: Clear to auscultation  bilaterally  Musculoskeletal: No deformity  Neurological examination  Mentation: Alert oriented to time, place, history taking, and causual conversation  Cranial nerve II-XII: Pupils were equal round reactive to light. Extraocular movements were full.  Visual field were full on confrontational test. Bilateral fundi were sharp.  Facial sensation and strength were normal. Hearing was intact to finger rubbing bilaterally. Uvula tongue midline.  Head turning and shoulder shrug and were normal and symmetric.Tongue protrusion into cheek strength was normal.  Motor: right elbow tenderness to touch upon deep palpation,  mild right finger abduction weakness,right elbow tenderness upon deep palpation  Sensory: Intact to fine touch, pinprick, preserved vibratory sensation, and proprioception at toes, with exception of decreased light touch, at right fourth and fifth fingers,.  Coordination: Normal finger to nose, heel-to-shin bilaterally there was no truncal ataxia  Gait: Rising up from seated position without assistance, normal stance, without trunk ataxia, moderate stride, good arm swing, smooth turning, able to perform tiptoe, and heel walking without difficulty.   Romberg signs: Negative  Deep tendon reflexes: Brachioradialis 2/2, biceps 2/2, triceps 2/2, patellar 2/2, Achilles 2/2, plantar responses were flexor bilaterally.   DIAGNOSTIC DATA (LABS, IMAGING, TESTING) - I reviewed patient records, labs, notes, testing and imaging myself where available.  Lab Results  Component Value Date   WBC 8.7 06/12/2013   HGB 13.5 06/12/2013   HCT 40.9 06/12/2013   MCV 86.4 06/12/2013   PLT 241.0 06/12/2013      Component Value Date/Time   NA 139 04/07/2013 1800   K 4.1 04/07/2013 1800   CL 99 04/07/2013 1800   CO2 27 04/07/2013 1800   GLUCOSE 115* 04/07/2013 1800   BUN 17 04/07/2013 1800   CREATININE 0.81 04/07/2013 1800   CALCIUM 9.4 04/07/2013 1800   PROT 7.2 06/12/2013 1632   ALBUMIN 3.8  06/12/2013 1632   AST 24 06/12/2013 1632   ALT 21 06/12/2013 1632   ALKPHOS 64 06/12/2013 1632   BILITOT 0.6 06/12/2013 1632   GFRNONAA 88* 04/07/2013 1800   GFRAA >90 04/07/2013 1800   Lab Results  Component Value Date   CHOL 144 08/14/2012   HDL 53.90 08/14/2012   LDLCALC 72 08/14/2012   TRIG  90.0 08/14/2012   CHOLHDL 3 08/14/2012   Lab Results  Component Value Date   HGBA1C 5.8 04/29/2013   Lab Results  Component Value Date   ITGPQDIY64 158 06/30/2009   Lab Results  Component Value Date   TSH 0.83 04/29/2013      ASSESSMENT AND PLAN  Euretha Najarro Stump Pankonin is a 43 y.o. female complains of  Right-sided neck pain, radiating pain to right shoulder, bicep region, right elbow tenderness, radiating pain to right fourth and fifth fingers,, EMG nerve conductions showed mild chronic right C5-6 radiculopathy, MRI cervical spine showed C5-6: disc bulging, degenerative spondylosis, uncovertebral joint hypertrophy, with severe right foraminal stenosis; potential impingement upon the right C6 root. At C7-T1: uncovertebral joint hypertrophy with mild right foraminal stenosis.  She has constant right neck, right biceps, lateral shoulder, 8 out of 10 deep achy pain, I have referred her to pain management, appointment pending January 2016, physical therapy,  1, increase gabapentin to 300 mg 2 tablets 3 times a day 2. Vicodin as needed 3, return to clinic in February 2016      Marcial Pacas, M.D. Ph.D.  [facility] [address], [place], [place] [address] [phone]

## 2014-02-25 ENCOUNTER — Ambulatory Visit: Payer: Medicare HMO | Admitting: Neurology

## 2014-02-26 ENCOUNTER — Ambulatory Visit: Payer: Medicare HMO | Admitting: Rehabilitative and Restorative Service Providers"

## 2014-03-05 ENCOUNTER — Other Ambulatory Visit: Payer: Self-pay | Admitting: Internal Medicine

## 2014-03-05 ENCOUNTER — Other Ambulatory Visit: Payer: Self-pay | Admitting: Cardiovascular Disease

## 2014-03-07 ENCOUNTER — Ambulatory Visit
Payer: Commercial Managed Care - HMO | Attending: Neurology | Admitting: Rehabilitative and Restorative Service Providers"

## 2014-03-07 ENCOUNTER — Encounter: Payer: Self-pay | Admitting: Rehabilitative and Restorative Service Providers"

## 2014-03-07 DIAGNOSIS — M542 Cervicalgia: Secondary | ICD-10-CM | POA: Diagnosis not present

## 2014-03-07 DIAGNOSIS — M5010 Cervical disc disorder with radiculopathy, unspecified cervical region: Secondary | ICD-10-CM | POA: Insufficient documentation

## 2014-03-07 NOTE — Patient Instructions (Signed)
Thoracic Self-Mobilization (Supine) Healthy Back - Shoulder Roll   Stand straight with arms relaxed at sides. Roll shoulders backward continuously. Do _10___ times. Perform 2-3 times/day. This exercise can also be done one shoulder at a time.  Copyright  VHI. All rights reserved.    With rolled towel placed lengthwise at lower ribs level, lie back on towel with arms outstretched. Hold __2 minutes. Relax. Repeat __1__ times per set. Do __1__ sets per session. Do __2__ sessions per day.  http://orth.exer.us/1001   Copyright  VHI. All rights reserved.

## 2014-03-07 NOTE — Therapy (Signed)
[facility] [address] [place], [place], [address] Phone: [phone]   Fax:  [phone]  Physical Therapy Evaluation  Patient Details  Name: [name] MRN: [id number] Date of Birth: 18-Sep-1970  Encounter Date: 03/07/2014      PT End of Session - 03/07/14 1709    Visit Number 1  G code (1)   Number of Visits 12   Date for PT Re-Evaluation 04/06/14   Authorization Type humana HMO- no auth required   PT Start Time 1442   PT Stop Time 1530   PT Time Calculation (min) 48 min   Activity Tolerance Patient tolerated treatment well   Behavior During Therapy [facility] for tasks assessed/performed      Past Medical History  Diagnosis Date  . Hypothyroidism   . Hypertension     a  . Anxiety   . Depression     during divorce & legal matters  . Osteoarthritis   . GERD (gastroesophageal reflux disease)   . IBS (irritable bowel syndrome)   . Lupus hx positive ANA    followed by Dr Gavin Pound; possible lupus  . History of polycystic ovarian disease S/P BSO  . Polyarthritis, inflammatory   . Chronic joint pain   . Myalgia   . History of kidney stones     Dr Phebe Colla  . Hx of anxiety disorder     Past Surgical History  Procedure Laterality Date  . Eye surgery      Retinal tears surgery bilateral  . Tonsillectomy    . Carpal tunnel release      bilateral; Dr Sherwood Gambler  . Plantar fascia surgery      right  . Plantar fascia surgery  02/2011    left  . Cystoscopy w/ ureteral stent placement  11/12/2011    Procedure: CYSTOSCOPY WITH RETROGRADE PYELOGRAM/URETERAL STENT PLACEMENT;  Surgeon: Alexis Frock, MD;  Location: [facility];  Service: Urology;  Laterality: Right;  . Laparoscopic cholecystectomy  05-31-2007  . Cysto/ right retrograde ureteral pyelogram  07-09-2004    HX BILATERAL RENAL STONES/ RIGHT FLANK PAIN  . Excision left bartholin gland  02-05-2002  . Laparoscopy with right salpingo-oophectomy/ lysis  adhesions and ablation endometriosis  05-17-2001  . Right ureteroscopic stone extraction  08-04-2000  . Laparoscopic laser ablation endometriosis and left salpingo-oophorectomy  11-03-1999  . Laparoscopic assisted vaginal hysterectomy  2000  . Dilation and curettage of uterus      X5  . Ankle surgery      X3  . Appendectomy  1991    exploratory lap  . Lumbar laminectomy  2003    L4 - L5  . Ureteral stent placement  11/12/11    Dr Tresa Moore  . Cystoscopy w/ ureteral stent removal  12/07/2011    Procedure: CYSTOSCOPY WITH STENT REMOVAL;  Surgeon: Alexis Frock, MD;  Location: [facility];  Service: Urology;  Laterality: Right;  . Cystoscopy/retrograde/ureteroscopy/stone extraction with basket  12/07/2011    Procedure: CYSTOSCOPY/RETROGRADE/URETEROSCOPY/STONE EXTRACTION WITH BASKET;  Surgeon: Alexis Frock, MD;  Location: [facility];  Service: Urology;  Laterality: Right;  . Colonoscopy      in 1990s    There were no vitals taken for this visit.  Visit Diagnosis:  Cervical disc disorder with radiculopathy of cervical region - Plan: PT plan of care cert/re-cert  Neck pain on right side - Plan: PT plan of care cert/re-cert      Subjective Assessment - 03/07/14 1443  Symptoms The patient reports 2-3 month onset of R proximal arm pain in bicipital region.  She notes symptoms are worse when she is laying on her R side and proximal arm pain and elbow pain.  She notes that symptoms have progressively worsened since onset.  Neck pain is worsening, the R UE is "shaking" when holding a book.  Her 4th/5th digits go numb x the past 6 months (whereas the shoulder and neck the past 2-3 months).                                                                                                                              Pertinent History h/o back surgery with degenerative disc disease   Limitations House hold activities   Patient Stated Goals No pain is goal.   Currently in  Pain? Yes   Pain Score 8    Pain Location Shoulder   Pain Orientation Right   Pain Descriptors / Indicators Numbness   Pain Type Chronic pain   Pain Radiating Towards R UE   Pain Onset More than a month ago   Pain Frequency Constant   Aggravating Factors  worse withlaying on the R side, using the R UE in daily activities   Pain Relieving Factors nothing   Effect of Pain on Daily Activities hinders use of R UE   Multiple Pain Sites --   Pain Score 8   Pain Type Chronic pain   Pain Location Elbow   Pain Orientation Right   Pain Radiating Towards R UE   Pain Descriptors / Indicators Nagging;Throbbing;Sore   Pain Frequency Intermittent   Pain Onset On-going   Pain Score 8   Pain Type Chronic pain   Pain Location Neck   Pain Radiating Towards right UE   Pain Descriptors / Indicators Aching;Burning;Tingling   Pain Onset On-going   Pain Frequency Constant          [facility] PT Assessment - 03/07/14 1452    Assessment   Medical Diagnosis --  R cervical radiculopathy   Onset Date --  2-3 months ago   Next MD Visit --  04/2013 pain clinic 05/2013 Dr. Krista Blue f/u   Prior Therapy none   Precautions   Precaution Comments --  avoid painful movements   Home Environment   Living Enviornment Private residence   Living Arrangements Spouse/significant other   Type of [place] to enter   Entrance Stairs-Number of Steps --  3   Prior Function   Level of Independence Independent with basic ADLs   Vocation On disability   Observation/Other Assessments   Focus on Therapeutic Outcomes (FOTO)  45%   Other Surveys  --  NDI=38%   Sensation   Additional Comments --  intermittently impaired sensation 4th/5th digits   Posture/Postural Control   Posture/Postural Control Postural limitations   Postural Limitations Forward head;Rounded Shoulders   Posture Comments --  increased thoracic kyphosis  AROM   Overall AROM  Within functional limits for tasks performed    Overall AROM Comments --  UEs WFLs A/ROM, cervical A/ROM WFLs with + pain R   Strength   Right Shoulder Flexion 3/5  limited by pain   Right Shoulder ABduction 3/5  limited by pain   Right Elbow Flexion 3/5  limited by pain   Right Elbow Extension 5/5   Right Wrist Flexion 4/5   Right Wrist Extension 3+/5   Grip (lbs) --  12    Grip (lbs) --  30            PT Education - 03/07/14 1524    Education provided Yes   Education Details HEP: towel roll stretch, shoulder rolls   Person(s) Educated Patient   Methods Explanation;Demonstration;Handout   Comprehension Returned demonstration;Verbalized understanding     THERAPEUTIC EXERCISE: See HEP.        PT Short Term Goals - 03/07/14 1715    PT SHORT TERM GOAL #1   Title The patient will be independent with HEP.   Baseline 04/06/2014   Time 4   Period Weeks   Status New   PT SHORT TERM GOAL #2   Title The patient will decrease pain level to 5/10 in neck and proximal R UE.   Baseline 04/06/2014   Time 4   Period Weeks   Status New   PT SHORT TERM GOAL #3   Title The patient will return demo proper sleeping positions for improved management of symptoms at night.   Baseline 04/06/2014   Time 4   Period Weeks   Status New   PT SHORT TERM GOAL #4   Title The patient will have 4/5 R UE muscle testing strength for shoulder flexion, abduction.   Baseline 04/06/2014   Time 4   Period Weeks   Status New          PT Long Term Goals - 03/07/14 1717    PT LONG TERM GOAL #1   Title Pt will improve neck disability index rating by 10% for decreased subjective impact of neck pain.   Baseline 04/23/2014   Time 6   Period Weeks   Status New   PT LONG TERM GOAL #2   Title Pt will imrpove R grip strength to > or equal to 20lbs.   Baseline 04/23/2014   Time 6   Period Weeks   Status New   PT LONG TERM GOAL #3   Title Pt will report pain < or equal to 3/10 in neck, proximal R UE.   Baseline 04/23/2014   Time 6   Period Weeks    Status New   PT LONG TERM GOAL #4   Title Pt will verbalize understanding of gym routine for post d/c wellness routine.   Baseline 04/23/2014   Time 6   Period Weeks   Status New          Plan - 03/07/14 1710    Clinical Impression Statement The patient is a 43 yo female referred to outpatient physical therapy for R cervical pain with recent MRI revealing C5-C6 radiculopathy, R deltoid/bicipital pain, R elbow pain and intermittent numbness and tingling of the 4th/5th digits of the R UE.  PT will benefit from PT for flexibility,  postural stabilization, R UE strengthening, and instruction in HEP/ sleeping positions and pain management.  Pt will benefit from skilled therapeutic intervention in order to improve on the following deficits Decreased strength;Impaired flexibility;Improper body mechanics;Postural dysfunction;Impaired sensation;Impaired UE functional use;Pain   Rehab Potential Good   PT Frequency 2x / week   PT Duration 6 weeks   PT Treatment/Interventions ADLs/Self Care Home Management;Traction;Moist Heat;Therapeutic activities;Patient/family education;Passive range of motion;Therapeutic exercise;Manual techniques;Cryotherapy   PT Next Visit Plan Check current HEP (towel roll and shoulder rolls); begin posture stabilization, neural gliding, manual traction, try home traction unit (if indicated), and progress strengthening/flexibility to tolerance.   Consulted and Agree with Plan of Care Patient          G-Codes - Mar 24, 2014 1525    Functional Assessment Tool Used clinician judgment; neck disability index   Functional Limitation Self care   Mobility: Walking and Moving Around Current Status (J1914) At least 20 percent but less than 40 percent impaired, limited or restricted   Mobility: Walking and Moving Around Goal Status [phone]) At least 1 percent but less than 20 percent impaired,  limited or restricted      Problem List Patient Active Problem List   Diagnosis Date Noted  . Neck pain 02/03/2014  . Ulnar neuropathy 01/16/2014  . Fibromyalgia 01/16/2014  . 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 Other abnormal glucose 04/29/2013  . Arthralgia 04/29/2013  . Panic attacks 01/15/2013  . IBS (irritable bowel syndrome) 12/14/2011  . Premature menopause on hormone replacement therapy 09/02/2011  . B12 DEFICIENCY 06/30/2009  . HYPOTHYROIDISM 03/02/2007  . POLYCYSTIC OVARIAN DISEASE 03/02/2007  . Unspecified essential hypertension 03/02/2007     Rudell Cobb, PT, MPT 03-24-2014 5:24 PM [facility] Phone: [phone] Fax: [phone]  [place] 2014-03-24, 5:23 PM

## 2014-03-11 ENCOUNTER — Encounter: Payer: Self-pay | Admitting: Rehabilitative and Restorative Service Providers"

## 2014-03-11 ENCOUNTER — Ambulatory Visit: Payer: Commercial Managed Care - HMO | Admitting: Rehabilitative and Restorative Service Providers"

## 2014-03-11 DIAGNOSIS — M5010 Cervical disc disorder with radiculopathy, unspecified cervical region: Secondary | ICD-10-CM

## 2014-03-11 DIAGNOSIS — M542 Cervicalgia: Secondary | ICD-10-CM

## 2014-03-11 NOTE — Therapy (Signed)
[facility] [address] [place], [place], [address] Phone: [phone]   Fax:  [phone]  Physical Therapy Treatment  Patient Details  Name: [name] MRN: [id number] Date of Birth: [birth year]  Encounter Date: 03/11/2014      PT End of Session - 03/11/14 1352    Visit Number 2  G code (2)   Number of Visits 12   Date for PT Re-Evaluation 04/06/14   Authorization Type human[employer] HMO- no auth required   PT Start Time 6384   PT Stop Time 1330   PT Time Calculation (min) 55 min   Activity Tolerance Patient tolerated treatment well      Past Medical History  Diagnosis Date  . Hypothyroidism   . Hypertension     a  . Anxiety   . Depression     during divorce & legal matters  . Osteoarthritis   . GERD (gastroesophageal reflux disease)   . IBS (irritable bowel syndrome)   . Lupus hx positive ANA    followed by Dr Gavin Pound; possible lupus  . History of polycystic ovarian disease S/P BSO  . Polyarthritis, inflammatory   . Chronic joint pain   . Myalgia   . History of kidney stones     Dr Phebe Colla  . Hx of anxiety disorder     Past Surgical History  Procedure Laterality Date  . Eye surgery      Retinal tears surgery bilateral  . Tonsillectomy    . Carpal tunnel release      bilateral; Dr Sherwood Gambler  . Plantar fascia surgery      right  . Plantar fascia surgery  02/2011    left  . Cystoscopy w/ ureteral stent placement  11/12/2011    Procedure: CYSTOSCOPY WITH RETROGRADE PYELOGRAM/URETERAL STENT PLACEMENT;  Surgeon: Alexis Frock, MD;  Location: [facility];  Service: Urology;  Laterality: Right;  . Laparoscopic cholecystectomy  05-31-2007  . Cysto/ right retrograde ureteral pyelogram  07-09-2004    HX BILATERAL RENAL STONES/ RIGHT FLANK PAIN  . Excision left bartholin gland  02-05-2002  . Laparoscopy with right salpingo-oophectomy/ lysis adhesions and ablation endometriosis  05-17-2001  . Right  ureteroscopic stone extraction  08-04-2000  . Laparoscopic laser ablation endometriosis and left salpingo-oophorectomy  11-03-1999  . Laparoscopic assisted vaginal hysterectomy  2000  . Dilation and curettage of uterus      X5  . Ankle surgery      X3  . Appendectomy  1991    exploratory lap  . Lumbar laminectomy  2003    L4 - L5  . Ureteral stent placement  11/12/11    Dr Tresa Moore  . Cystoscopy w/ ureteral stent removal  12/07/2011    Procedure: CYSTOSCOPY WITH STENT REMOVAL;  Surgeon: Alexis Frock, MD;  Location: [facility];  Service: Urology;  Laterality: Right;  . Cystoscopy/retrograde/ureteroscopy/stone extraction with basket  12/07/2011    Procedure: CYSTOSCOPY/RETROGRADE/URETEROSCOPY/STONE EXTRACTION WITH BASKET;  Surgeon: Alexis Frock, MD;  Location: [facility];  Service: Urology;  Laterality: Right;  . Colonoscopy      in 1990s    There were no vitals taken for this visit.  Visit Diagnosis:  Cervical disc disorder with radiculopathy of cervical region  Neck pain on right side      Subjective Assessment - 03/11/14 1241    Symptoms The patient reports not sleeping well.  She cannot get comfortable when trying to sleep.  Her symptoms have been worse  the past 2 days, which she feels is related to trying to perform target shootong on Sunday.     Currently in Pain? Yes   Pain Score 5    Pain Location Neck   Pain Orientation Right   Pain Descriptors / Indicators Numbness;Sharp;Aching   Pain Type Chronic pain   Pain Radiating Towards R UE   Pain Onset More than a month ago   Pain Frequency Constant   Aggravating Factors  worse with lying on the R side and using R UE   Pain Relieving Factors improved from 9/10 today after taking 2 tylenol arthritis   Multiple Pain Sites No     THERAPEUTIC EXERCISE: Standing chin tucks x 10 reps while leaning against physioball to wall (behind upper back). Standing "W" exercise not tolerated due to increased  deltoid/bicipital pain. Door frame stretch for R pectoralis musculature to tolerable range. Standing scapular retraction x 10 reps with ER of shoulders (attempted to add yellow t-band, but creates 10/10 deltoid/bicipital pain). Supine chin tucks with towel roll.  Supine neck flexion increases neck pain, supine neck extension over towel roll decreases pain.   R rotation decreases pain, L rotation increases pain R cervical region.  Supine towel roll stretch into gentle traction position for elongation that relieves symptoms.  MANUAL: Gentle manual distraction of cervical spine with significant reduction in symptoms.  Passive ROM R/L rotation. Soft tissue mobilization R levator scapulae, R upper trapezius, R deltoid/bicipital region extending to elbow.    SELF CARE/HOME MANAGEMENT: Sleeping positions discussed and demonstrated for right and left sidelying and supine for adequate support and comfort of the R UE/neck.      PT Education - 03/11/14 1351    Education provided Yes   Education Details HEP: scapular retraction and axial extension   Person(s) Educated Patient   Methods Explanation;Demonstration;Handout   Comprehension Returned demonstration;Verbalized understanding         Plan - 03/11/14 1352    Clinical Impression Statement The patient has decreased neck/radiating R UE symptoms with cervical extension, axial extension and R rotation.  Symptoms are aggravated by cervical flexion and L rotation.  She has pain radiating into R deltoid region and bicipital region that is aggravated by shoulder abduction with elbow flexion.  PT and the patient discussed transfer of services to [facility] location (for cervical mechanical traction) as manual distraction significantly reduces radiating symptoms and neck pain. Also, recommend continued postural training/stabilization, soft tissue mobiliation and strengthening.                                                                                                                             PT Frequency 2x / week   PT Duration 6 weeks   PT Next Visit Plan Check HEP, perform postural stabilization within tolerable range of motion, manual traction, neural gliding.   Consulted and Agree with Plan of Care Patient  Problem List Patient Active Problem List   Diagnosis Date Noted  . Neck pain 02/03/2014  . Ulnar neuropathy 01/16/2014  . Fibromyalgia 01/16/2014  . 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 Other abnormal glucose 04/29/2013  . Arthralgia 04/29/2013  . Panic attacks 01/15/2013  . IBS (irritable bowel syndrome) 12/14/2011  . Premature menopause on hormone replacement therapy 09/02/2011  . B12 DEFICIENCY 06/30/2009  . HYPOTHYROIDISM 03/02/2007  . POLYCYSTIC OVARIAN DISEASE 03/02/2007  . Unspecified essential hypertension 03/02/2007    Rudell Cobb, PT, MPT 03/11/2014 2:02 PM [facility] Phone: [phone] Fax: [phone]   [place] 03/11/2014, 1:58 PM

## 2014-03-11 NOTE — Patient Instructions (Addendum)
Axial Extension (Chin Tuck)   Pull chin in and lengthen back of neck. Hold _3___ seconds while counting out loud. Repeat __10__ times. Do _2___ sessions per day.  http://gt2.exer.us/450   Copyright  VHI. All rights reserved.  Scapular Retraction (Standing)   With arms at sides, pinch shoulder blades together and *turn palms to face forwards. Repeat __10__ times per set. Do _1___ sets per session. Do __2__ sessions per day.  http://orth.exer.us/945   Copyright  VHI. All rights reserved.

## 2014-03-13 ENCOUNTER — Ambulatory Visit: Payer: Medicare HMO | Admitting: Neurology

## 2014-03-14 ENCOUNTER — Telehealth: Payer: Self-pay | Admitting: Neurology

## 2014-03-14 ENCOUNTER — Encounter: Payer: Self-pay | Admitting: Rehabilitative and Restorative Service Providers"

## 2014-03-14 ENCOUNTER — Ambulatory Visit: Payer: Commercial Managed Care - HMO | Admitting: Rehabilitative and Restorative Service Providers"

## 2014-03-14 DIAGNOSIS — M542 Cervicalgia: Secondary | ICD-10-CM

## 2014-03-14 DIAGNOSIS — M2550 Pain in unspecified joint: Secondary | ICD-10-CM

## 2014-03-14 DIAGNOSIS — M5010 Cervical disc disorder with radiculopathy, unspecified cervical region: Secondary | ICD-10-CM | POA: Diagnosis not present

## 2014-03-14 NOTE — Therapy (Deleted)
[facility] [address] [place], [place], [address] Phone: [phone]   Fax:  [phone]  Physical Therapy Treatment  Patient Details  Name: [name] MRN: [id number] Date of Birth: 25-Jul-1970  Encounter Date: 03/14/2014    Past Medical History  Diagnosis Date  . Hypothyroidism   . Hypertension     a  . Anxiety   . Depression     during divorce & legal matters  . Osteoarthritis   . GERD (gastroesophageal reflux disease)   . IBS (irritable bowel syndrome)   . Lupus hx positive ANA    followed by Dr Gavin Pound; possible lupus  . History of polycystic ovarian disease S/P BSO  . Polyarthritis, inflammatory   . Chronic joint pain   . Myalgia   . History of kidney stones     Dr Phebe Colla  . Hx of anxiety disorder     Past Surgical History  Procedure Laterality Date  . Eye surgery      Retinal tears surgery bilateral  . Tonsillectomy    . Carpal tunnel release      bilateral; Dr Sherwood Gambler  . Plantar fascia surgery      right  . Plantar fascia surgery  02/2011    left  . Cystoscopy w/ ureteral stent placement  11/12/2011    Procedure: CYSTOSCOPY WITH RETROGRADE PYELOGRAM/URETERAL STENT PLACEMENT;  Surgeon: Alexis Frock, MD;  Location: [facility];  Service: Urology;  Laterality: Right;  . Laparoscopic cholecystectomy  05-31-2007  . Cysto/ right retrograde ureteral pyelogram  07-09-2004    HX BILATERAL RENAL STONES/ RIGHT FLANK PAIN  . Excision left bartholin gland  02-05-2002  . Laparoscopy with right salpingo-oophectomy/ lysis adhesions and ablation endometriosis  05-17-2001  . Right ureteroscopic stone extraction  08-04-2000  . Laparoscopic laser ablation endometriosis and left salpingo-oophorectomy  11-03-1999  . Laparoscopic assisted vaginal hysterectomy  2000  . Dilation and curettage of uterus      X5  . Ankle surgery      X3  . Appendectomy  1991    exploratory lap  . Lumbar laminectomy  2003     L4 - L5  . Ureteral stent placement  11/12/11    Dr Tresa Moore  . Cystoscopy w/ ureteral stent removal  12/07/2011    Procedure: CYSTOSCOPY WITH STENT REMOVAL;  Surgeon: Alexis Frock, MD;  Location: [facility];  Service: Urology;  Laterality: Right;  . Cystoscopy/retrograde/ureteroscopy/stone extraction with basket  12/07/2011    Procedure: CYSTOSCOPY/RETROGRADE/URETEROSCOPY/STONE EXTRACTION WITH BASKET;  Surgeon: Alexis Frock, MD;  Location: [facility];  Service: Urology;  Laterality: Right;  . Colonoscopy      in 1990s    There were no vitals taken for this visit.  Visit Diagnosis:  No diagnosis found.      Subjective Assessment - 03/14/14 0857    Symptoms The patient reports pain worse after PT on 03/11/2014.  She feels pain worse in neck and in proximal shoulder.  She reports "pulling" sensation through R upper trap, and pain with lifting R UE.  The patient reports relief with shoulder rolls in the shower.  She also reports that she has a $45 co-pay at our clinic and would only have a $10 co-pay  Currently in Pain? Yes   Pain Score 8       THERAPEUTIC EXERCISE: Prone cervical retraction away from towel roll under forehead x 10 reps, Prone cervical retraction with minimal right and left rotation x 5 reps (with pain on the R side), prone superman UE attempted, but not tolerated due to proximal R E pain.    MANUAL: The patient performed supine gentle manual distraction with pain in bilateral upper trapezius musculature.  Passive overpressure performed for rotation with pain worse to the left. Gentle soft tissue mobilization proximal R UE.     Problem List Patient Active Problem List   Diagnosis Date Noted  . Neck pain 02/03/2014  . Ulnar neuropathy 01/16/2014  . Fibromyalgia 01/16/2014  . 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 Other abnormal glucose 04/29/2013  . Arthralgia 04/29/2013   . Panic attacks 01/15/2013  . IBS (irritable bowel syndrome) 12/14/2011  . Premature menopause on hormone replacement therapy 09/02/2011  . B12 DEFICIENCY 06/30/2009  . HYPOTHYROIDISM 03/02/2007  . POLYCYSTIC OVARIAN DISEASE 03/02/2007  . Unspecified essential hypertension 03/02/2007    [name] 03/14/2014, 11:28 AM

## 2014-03-14 NOTE — Therapy (Signed)
[facility] [address] [place], [place], [address] Phone: [phone]   Fax:  [phone]  Physical Therapy Treatment  Patient Details  Name: [name] MRN: [id number] Date of Birth: [birth year]  Encounter Date: 03/14/2014      PT End of Session - 03/14/14 1131    Visit Number 3   Number of Visits 12   Authorization Type [employer] H[employer]- no auth required   PT Start Time 0850   PT Stop Time 0935   PT Time Calculation (min) 45 min   Activity Tolerance Patient limited by pain      Past Medical History  Diagnosis Date  . Hypothyroidism   . Hypertension     a  . Anxiety   . Depression     during divorce & legal matters  . Osteoarthritis   . GERD (gastroesophageal reflux disease)   . IBS (irritable bowel syndrome)   . Lupus hx positive ANA    followed by Dr Gavin Pound; possible lupus  . History of polycystic ovarian disease S/P BSO  . Polyarthritis, inflammatory   . Chronic joint pain   . Myalgia   . History of kidney stones     Dr Phebe Colla  . Hx of anxiety disorder     Past Surgical History  Procedure Laterality Date  . Eye surgery      Retinal tears surgery bilateral  . Tonsillectomy    . Carpal tunnel release      bilateral; Dr Sherwood Gambler  . Plantar fascia surgery      right  . Plantar fascia surgery  02/2011    left  . Cystoscopy w/ ureteral stent placement  11/12/2011    Procedure: CYSTOSCOPY WITH RETROGRADE PYELOGRAM/URETERAL STENT PLACEMENT;  Surgeon: Alexis Frock, MD;  Location: [facility];  Service: Urology;  Laterality: Right;  . Laparoscopic cholecystectomy  05-31-2007  . Cysto/ right retrograde ureteral pyelogram  07-09-2004    HX BILATERAL RENAL STONES/ RIGHT FLANK PAIN  . Excision left bartholin gland  02-05-2002  . Laparoscopy with right salpingo-oophectomy/ lysis adhesions and ablation endometriosis  05-17-2001  . Right ureteroscopic stone extraction  08-04-2000  . Laparoscopic laser  ablation endometriosis and left salpingo-oophorectomy  11-03-1999  . Laparoscopic assisted vaginal hysterectomy  2000  . Dilation and curettage of uterus      X5  . Ankle surgery      X3  . Appendectomy  1991    exploratory lap  . Lumbar laminectomy  2003    L4 - L5  . Ureteral stent placement  11/12/11    Dr Tresa Moore  . Cystoscopy w/ ureteral stent removal  12/07/2011    Procedure: CYSTOSCOPY WITH STENT REMOVAL;  Surgeon: Alexis Frock, MD;  Location: [facility];  Service: Urology;  Laterality: Right;  . Cystoscopy/retrograde/ureteroscopy/stone extraction with basket  12/07/2011    Procedure: CYSTOSCOPY/RETROGRADE/URETEROSCOPY/STONE EXTRACTION WITH BASKET;  Surgeon: Alexis Frock, MD;  Location: [facility];  Service: Urology;  Laterality: Right;  . Colonoscopy      in 1990s    There were no vitals taken for this visit.  Visit Diagnosis:  Cervical disc disorder with radiculopathy of cervical region  Neck pain on right side      Subjective Assessment - 03/14/14 0857    Symptoms The patient reports pain worse after PT on 03/11/2014.  She feels pain worse in neck and in proximal shoulder.  She reports "pulling" sensation through R upper trap, and pain with  lifting R UE.  The patient reports relief with shoulder rolls in the shower.  She also reports that she has a $45 co-pay at our clinic and would only have a $10 co-pay at another clinic.                                                         Currently in Pain? Yes   Pain Score 8            PT Education - Mar 18, 2014 1131    Education provided Yes   Education Details HEP: prone axial extension, d/c scap retraction exercise   Person(s) Educated Patient   Methods Explanation;Demonstration;Handout   Comprehension Returned demonstration;Verbalized understanding          PT Short Term Goals - Mar 18, 2014 1134    PT SHORT TERM GOAL #1   Title The patient will be independent with HEP.   Status Achieved    PT SHORT TERM GOAL #2   Title The patient will decrease pain level to 5/10 in neck and proximal R UE.   Status Not Met   PT SHORT TERM GOAL #3   Title The patient will return demo proper sleeping positions for improved management of symptoms at night.   Status Achieved   PT SHORT TERM GOAL #4   Title The patient will have 4/5 R UE muscle testing strength for shoulder flexion, abduction.   Status Not Met          PT Long Term Goals - March 18, 2014 1134    PT LONG TERM GOAL #1   Title Pt will improve neck disability index rating by 10% for decreased subjective impact of neck pain.   Baseline *FOTO not done due to patient ending PT early due to financial concerns.   Status Deferred   PT LONG TERM GOAL #2   Title Pt will imrpove R grip strength to > or equal to 20lbs.   Status Not Met   PT LONG TERM GOAL #3   Title Pt will report pain < or equal to 3/10 in neck, proximal R UE.   Status Not Met   PT LONG TERM GOAL #4   Title Pt will verbalize understanding of gym routine for post d/c wellness routine.   Status Not Met          Plan - Mar 18, 2014 1132    Clinical Impression Statement PT is requesting discharge today due to high co-pay.  She reports that she thinks her copay is higher due to our clinic being a hospital based outpatient clinic.  She wants to request referral to a clinic in [place] that is private outpatient clinic because she things the copay will be $10 per session instead of $45.   Recommended Other Services Transfer to outpatient clinic in [place] if MD submits order/agrees.   Consulted and Agree with Plan of Care Patient          G-Codes - March 18, 2014 1135    Functional Assessment Tool Used clinician judgment; neck disability index   Functional Limitation Self care   Self Care Current Status (A2505) At least 1 percent but less than 20 percent impaired, limited or restricted   Self Care Discharge Status [phone]) At least 20 percent but less than 40 percent  impaired, limited or restricted  THERAPEUTIC EXERCISE: Prone cervical retraction away from towel roll under forehead x 10 reps, Prone cervical retraction with minimal right and left rotation x 5 reps (with pain on the R side), prone superman UE attempted, but not tolerated due to proximal R E pain.  Reviewed post d/c HEP verbally.   MANUAL: The patient performed supine gentle manual distraction with pain in bilateral upper trapezius musculature.  Passive overpressure performed for rotation with pain worse to the left. Gentle soft tissue mobilization proximal R UE.  Problem List Patient Active Problem List   Diagnosis Date Noted  . Neck pain 02/03/2014  . Ulnar neuropathy 01/16/2014  . Fibromyalgia 01/16/2014  . 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 Other abnormal glucose 04/29/2013  . Arthralgia 04/29/2013  . Panic attacks 01/15/2013  . IBS (irritable bowel syndrome) 12/14/2011  . Premature menopause on hormone replacement therapy 09/02/2011  . B12 DEFICIENCY 06/30/2009  . HYPOTHYROIDISM 03/02/2007  . POLYCYSTIC OVARIAN DISEASE 03/02/2007  . Unspecified essential hypertension 03/02/2007      PHYSICAL THERAPY DISCHARGE SUMMARY  Visits from Start of Care: 3  Current functional level related to goals / functional outcomes: See above for goals.  Patient did not meet all goals as she is requesting d/c early due to financial concerns.   Remaining deficits: Continued pain in R UE and neck improved with cervical extension and worse with flexion.  Patient will benefit from continued PT (possibly at another location in which she has a lower copay per her report) to work on postural stabilization, pain management, strengthening and flexibility activities.   Education / Equipment: HEP.  Plan: Patient agrees to discharge.  Patient goals were not met. Patient is being discharged due to financial reasons.  ?????   Thank you for the referral of this patient.  Rudell Cobb,  PT, MPT 03/14/2014 11:43 AM [facility] Phone: [phone] Fax: [phone]   [place] 03/14/2014, 11:40 AM

## 2014-03-14 NOTE — Patient Instructions (Signed)
Axial Extension: Over Edge   Lie on stomach WITH YOUR HEAD RESTING ON A TOWEL ROLL (hands can be by your shoulders). Tuck chin in and slowly raise head and shoulders until they are in line with torso. Repeat __10__ times per set. Do __2__ sets per session. Do 2___ sessions per day.   http://orth.exer.us/973   Copyright  VHI. All rights reserved.  Continue to perform "axial extension", shoulder rolls and the towel roll stretch (lying on your back). Discontinue the shoulder blade squeezing.

## 2014-03-14 NOTE — Telephone Encounter (Signed)
Ordered PT

## 2014-03-17 ENCOUNTER — Ambulatory Visit: Payer: Medicare HMO | Admitting: Rehabilitative and Restorative Service Providers"

## 2014-03-17 ENCOUNTER — Ambulatory Visit: Payer: Commercial Managed Care - HMO | Admitting: Physical Therapy

## 2014-03-19 ENCOUNTER — Ambulatory Visit: Payer: Medicare HMO | Admitting: Rehabilitative and Restorative Service Providers"

## 2014-03-19 ENCOUNTER — Encounter: Payer: Medicare HMO | Admitting: Physical Therapy

## 2014-03-24 ENCOUNTER — Ambulatory Visit: Payer: Medicare HMO | Admitting: Rehabilitative and Restorative Service Providers"

## 2014-03-26 ENCOUNTER — Ambulatory Visit: Payer: Medicare HMO | Admitting: Rehabilitative and Restorative Service Providers"

## 2014-04-01 ENCOUNTER — Ambulatory Visit: Payer: Medicare HMO | Admitting: Rehabilitative and Restorative Service Providers"

## 2014-04-02 NOTE — Telephone Encounter (Signed)
error 

## 2014-04-03 ENCOUNTER — Ambulatory Visit: Payer: Medicare HMO | Admitting: Physical Therapy

## 2014-04-04 HISTORY — PX: REDUCTION MAMMAPLASTY: SUR839

## 2014-05-23 ENCOUNTER — Ambulatory Visit (INDEPENDENT_AMBULATORY_CARE_PROVIDER_SITE_OTHER): Payer: Commercial Managed Care - HMO | Admitting: Neurology

## 2014-05-23 ENCOUNTER — Encounter: Payer: Self-pay | Admitting: Neurology

## 2014-05-23 VITALS — BP 144/86 | HR 76 | Ht 68.0 in | Wt 246.0 lb

## 2014-05-23 DIAGNOSIS — M5010 Cervical disc disorder with radiculopathy, unspecified cervical region: Secondary | ICD-10-CM | POA: Insufficient documentation

## 2014-05-23 NOTE — Progress Notes (Signed)
PATIENT: [name] DOB: 1970/06/15  HISTORICAL  [name] is a 44 yo right-handed female, accompanied by her mother referred by her PCP Frances. [name] for evaluation of right neck pain,right hand fourth and fifth finger paresthesia  She had a past medical history of connective tissue disease, positive ANA, is under the care of rheumatologist Frances. Gavin [name], taking Plaquenil 200 mg daily.  She also had PMHx of lumbar decompression surgery in the past for left lumbar radiculopathy, bilateral carpal tunnel release surgery, hypothyroidism, on supplement, has been on disability because of her multiple medical issues  Since August 2015, she began to notice right elbow tenderness, shooting discomfort to her right fourth, fifth fingers, also noticed right biceps area deep achy pain, right neck pain, sometimes spreading forward, headaches, over the past 2 months, her symptoms has getting worse, more frequent, also had weakness of her right hand, she also complains of right wrist pain, right hand joints pain,  She denies gait difficulty, chronic low back pain,  She is taking gabapentin 300 mg twice a day for her low back pain, left lumbar radicular pain  UPDATE Nov 19th 2015: Electrodiagnostic study showed chronic mild right C5-6 radiculopathy, we have reviewed MRI cervical spine showed1. At C5-6: disc bulging, degenerative spondylosis, uncovertebral joint hypertrophy, with severe right foraminal stenosis; potential impingement upon the right C6 root. At C7-T1: uncovertebral joint hypertrophy with mild right foraminal stenosis.  She could not tolerate nortriptyline 10 mg every night, complains of irritations, she is also taking Celebrex 200 mg every day, gabapentin 300 mg daily,    UPDATE Feb 19th 2016: She has physical therapy, taking gabapentin 300mg  tid, celebrex 200mg  bid, seen by [name], epidural injection, most recent May 05 2014, without helping, She still complains of  right arm jerking,right lateral arm, right shoulder pain, she could not lay on her right shoulder.    REVIEW OF SYSTEMS: Full 14 system review of systems performed and notable only for runny nose, restless leg, insomnia, joint pain, swelling, aching muscles, muscle cramps, neck pain, stiffness, dizziness, headache, numbness,   ALLERGIES: Allergies  Allergen Reactions  . Ivp Dye [Iodinated Diagnostic Agents] Rash and Other (See Comments)    Flushing, minor facial rash & dyspnea  . Nalbuphine Shortness Of Breath and Rash    Nubain caused respiratory distress & rash  . Septra [Sulfamethoxazole-Trimethoprim] Shortness Of Breath and Rash  . Sulfamethoxazole-Trimethoprim Rash    rash  . Tylox [Oxycodone-Acetaminophen] Rash    HOME MEDICATIONS: Current Outpatient Prescriptions on File Prior to Visit  Medication Sig Dispense Refill  . aspirin 81 MG tablet Take 81 mg by mouth daily.     . celecoxib (CELEBREX) 200 MG capsule Take 200 capsules by mouth 2 (two) times daily.     . cholecalciferol (VITAMIN D) 1000 UNITS tablet Take 1,000 Units by mouth daily.    . citalopram (CELEXA) 20 MG tablet TAKE 1 TABLET EVERY DAY 90 tablet 0  . estradiol (ESTRACE) 1 MG tablet Take 1.5 mg by mouth daily.    . fish oil-omega-3 fatty acids 1000 MG capsule Take 1 g by mouth daily.    [name] gabapentin (NEURONTIN) 300 MG capsule Take 2 capsules (600 mg total) by mouth 3 (three) times daily. 180 capsule 11  . hydrochlorothiazide (MICROZIDE) 12.5 MG capsule TAKE 1 CAPSULE EVERY DAY 90 capsule 1  . HYDROcodone-acetaminophen (NORCO/VICODIN) 5-325 MG per tablet Take 1 tablet by mouth every 12 (twelve) hours as needed for moderate  pain. 60 tablet 0  . hydroxychloroquine (PLAQUENIL) 200 MG tablet Take 200 mg by mouth 2 (two) times daily.     [name] levothyroxine (SYNTHROID, LEVOTHROID) 112 MCG tablet Take 1 tablet (112 mcg total) by mouth every morning. 90 tablet 3  . lisinopril (PRINIVIL,ZESTRIL) 40 MG tablet TAKE 1 TABLET EVERY  DAY 90 tablet 0  . omeprazole (PRILOSEC) 40 MG capsule TAKE 1 CAPSULE EVERY MORNING 90 capsule 0   No current facility-administered medications on file prior to visit.    PAST MEDICAL HISTORY: Past Medical History  Diagnosis Date  . Hypothyroidism   . Hypertension     a  . Anxiety   . Depression     during divorce & legal matters  . Osteoarthritis   . GERD (gastroesophageal reflux disease)   . IBS (irritable bowel syndrome)   . [name] hx positive ANA    followed by [name]  . History of polycystic ovarian disease S/P BSO  . Polyarthritis, inflammatory   . Chronic joint pain   . Myalgia   . History of kidney stones     [name]  . Hx of anxiety disorder     PAST SURGICAL HISTORY: Past Surgical History  Procedure Laterality Date  . Eye surgery      Retinal tears surgery bilateral  . Tonsillectomy    . Carpal tunnel release      bilateral; [name]  . Plantar fascia surgery      right  . Plantar fascia surgery  02/2011    left  . Cystoscopy w/ ureteral stent placement  11/12/2011    Procedure: CYSTOSCOPY WITH RETROGRADE PYELOGRAM/URETERAL STENT PLACEMENT;  Surgeon: Frances Frock, MD;  Location: [name];  Service: Urology;  Laterality: Right;  . Laparoscopic cholecystectomy  05-31-2007  . Cysto/ right retrograde ureteral pyelogram  07-09-2004    HX BILATERAL RENAL STONES/ RIGHT FLANK PAIN  . Excision left bartholin gland  02-05-2002  . Laparoscopy with right salpingo-oophectomy/ lysis adhesions and ablation endometriosis  05-17-2001  . Right ureteroscopic stone extraction  08-04-2000  . Laparoscopic laser ablation endometriosis and left salpingo-oophorectomy  11-03-1999  . Laparoscopic assisted vaginal hysterectomy  2000  . Dilation and curettage of uterus      X5  . Ankle surgery      X3  . Appendectomy  1991    exploratory lap  . Lumbar laminectomy  2003    L4 - L5  . Ureteral stent placement  11/12/11    [name]  . Cystoscopy  w/ ureteral stent removal  12/07/2011    Procedure: CYSTOSCOPY WITH STENT REMOVAL;  Surgeon: Frances Frock, MD;  Location: [name];  Service: Urology;  Laterality: Right;  . Cystoscopy/retrograde/ureteroscopy/stone extraction with basket  12/07/2011    Procedure: CYSTOSCOPY/RETROGRADE/URETEROSCOPY/STONE EXTRACTION WITH BASKET;  Surgeon: Frances Frock, MD;  Location: [facility];  Service: Urology;  Laterality: Right;  . Colonoscopy      in 1990s    FAMILY HISTORY: Family History  Problem Relation Age of Onset  . Hypertension Mother   . Colon polyps Mother   . Atrial fibrillation Mother     Frances Angelena Form  . Heart attack Father 54  . Diabetes Father   . Hypertension Sister   . Heart attack Paternal Grandmother 66  . Breast cancer Paternal Aunt   . Colon cancer Maternal Uncle   . Colon cancer Paternal Uncle   . Colon polyps Maternal Grandmother   .  Stroke Maternal Grandmother 76  . Diabetes Paternal Grandfather   . Stroke Maternal Grandfather 37    SOCIAL HISTORY:  History   Social History  . Marital Status: Married    Spouse Name: [name]    Number of Children: 0  . Years of Education: college   Occupational History  . disabled    Social History Main Topics  . Smoking status: Never Smoker   . Smokeless tobacco: Never Used  . Alcohol Use: 0.6 oz/week    1 Shots of liquor per week     Comment:  very rarely  . Drug Use: No  . Sexual Activity: Not on file   Other Topics Concern  . Not on file   Social History Narrative   Patient Lives at home with her husband [name])   Disabled.   Education two years of college.   Right handed.   Caffeine coffee and sweet tea. Not daily.     PHYSICAL EXAM   Filed Vitals:   05/23/14 0828  BP: 144/86  Pulse: 76  Height: 5\' 8"  (1.727 m)  Weight: 246 lb (111.585 kg)    Not recorded      Body mass index is 37.41 kg/(m^2).   Generalized: In no acute distress  Neck: Supple, no carotid  bruits   Cardiac: Regular rate rhythm  Pulmonary: Clear to auscultation bilaterally  Musculoskeletal: No deformity  Neurological examination  Mentation: Alert oriented to time, place, history taking, and causual conversation  Cranial nerve II-XII: Pupils were equal round reactive to light. Extraocular movements were full.  Visual field were full on confrontational test. Bilateral fundi were sharp.  Facial sensation and strength were normal. Hearing was intact to finger rubbing bilaterally. Uvula tongue midline.  Head turning and shoulder shrug and were normal and symmetric.Tongue protrusion into cheek strength was normal.  Motor: right elbow tenderness to touch upon deep palpation,  mild right finger abduction weakness,right elbow tenderness upon deep palpation  Sensory: Intact to fine touch, pinprick, preserved vibratory sensation, and proprioception at toes, with exception of decreased light touch, at right fourth and fifth fingers,.  Coordination: Normal finger to nose, heel-to-shin bilaterally there was no truncal ataxia  Gait: Rising up from seated position without assistance, normal stance, without trunk ataxia, moderate stride, good arm swing, smooth turning, able to perform tiptoe, and heel walking without difficulty.   Romberg signs: Negative  Deep tendon reflexes: Brachioradialis 1/2, biceps 1/2, triceps 2/2, patellar 2/2, Achilles 2/2, plantar responses were flexor bilaterally.   DIAGNOSTIC DATA (LABS, IMAGING, TESTING) - I reviewed patient records, labs, notes, testing and imaging myself where available.  Lab Results  Component Value Date   WBC 8.7 06/12/2013   HGB 13.5 06/12/2013   HCT 40.9 06/12/2013   MCV 86.4 06/12/2013   PLT 241.0 06/12/2013      Component Value Date/Time   NA 139 04/07/2013 1800   K 4.1 04/07/2013 1800   CL 99 04/07/2013 1800   CO2 27 04/07/2013 1800   GLUCOSE 115* 04/07/2013 1800   BUN 17 04/07/2013 1800   CREATININE 0.81 04/07/2013  1800   CALCIUM 9.4 04/07/2013 1800   PROT 7.2 06/12/2013 1632   ALBUMIN 3.8 06/12/2013 1632   AST 24 06/12/2013 1632   ALT 21 06/12/2013 1632   ALKPHOS 64 06/12/2013 1632   BILITOT 0.6 06/12/2013 1632   GFRNONAA 88* 04/07/2013 1800   GFRAA >90 04/07/2013 1800   Lab Results  Component Value Date   CHOL 144 08/14/2012  HDL 53.90 08/14/2012   LDLCALC 72 08/14/2012   TRIG 90.0 08/14/2012   CHOLHDL 3 08/14/2012   Lab Results  Component Value Date   HGBA1C 5.8 04/29/2013   Lab Results  Component Value Date   BZMCEYEM33 612 06/30/2009   Lab Results  Component Value Date   TSH 0.83 04/29/2013      ASSESSMENT AND PLAN  HEENA WOODBURY is a 44 y.o. female complains of  Right-sided neck pain, radiating pain to right shoulder, bicep region, right elbow tenderness, radiating pain to right fourth and fifth fingers,, EMG nerve conductions showed mild chronic right C5-6 radiculopathy, we have reviewed MRI cervical spine showed C5-6: disc bulging, degenerative spondylosis, uncovertebral joint hypertrophy, with severe right foraminal stenosis; potential impingement upon the right C6 root. At C7-T1: uncovertebral joint hypertrophy with mild right foraminal stenosis.  Right cervical radiculopathy, involving right C5, C6.  1, increase gabapentin to 300 mg 2 tablets 3 times a day 2. Continue pain management, 3, return to clinic in 3 months,    Marcial Pacas, M.D. Ph.D.  [facility] [address] [place], [place] [address] [phone]

## 2014-07-29 ENCOUNTER — Telehealth: Payer: Self-pay | Admitting: Internal Medicine

## 2014-07-29 ENCOUNTER — Other Ambulatory Visit: Payer: Self-pay | Admitting: Cardiovascular Disease

## 2014-07-29 MED ORDER — HYDROCHLOROTHIAZIDE 12.5 MG PO CAPS: 12.5000 mg | ORAL_CAPSULE | Freq: Every day | ORAL | Status: DC

## 2014-07-29 MED ORDER — LEVOTHYROXINE SODIUM 112 MCG PO TABS: 112.0000 ug | ORAL_TABLET | Freq: Every morning | ORAL | Status: DC

## 2014-07-29 NOTE — Telephone Encounter (Signed)
Patient is requesting a refill on levothyroxine and hydrocholorothiazide.  She needs scripts sent to [facility].

## 2014-07-29 NOTE — Telephone Encounter (Signed)
Done

## 2014-08-26 ENCOUNTER — Ambulatory Visit: Payer: Commercial Managed Care - HMO | Admitting: Neurology

## 2014-09-02 ENCOUNTER — Telehealth: Payer: Self-pay

## 2014-09-02 NOTE — Telephone Encounter (Signed)
Call to fup regarding AWV; STates she may have to reschedule; Will go home and check her schedule and call back; If she needs to reschedule AWV; will reschedule AWV with Manuela Schwartz prior to apt with Linna Darner for annual

## 2014-09-09 ENCOUNTER — Encounter: Payer: Medicare HMO | Admitting: Internal Medicine

## 2014-09-22 ENCOUNTER — Other Ambulatory Visit (INDEPENDENT_AMBULATORY_CARE_PROVIDER_SITE_OTHER): Payer: Commercial Managed Care - HMO

## 2014-09-22 ENCOUNTER — Encounter: Payer: Self-pay | Admitting: Internal Medicine

## 2014-09-22 ENCOUNTER — Ambulatory Visit (INDEPENDENT_AMBULATORY_CARE_PROVIDER_SITE_OTHER): Payer: Commercial Managed Care - HMO | Admitting: Internal Medicine

## 2014-09-22 VITALS — BP 124/72 | HR 62 | Temp 98.5°F | Resp 16 | Ht 68.0 in | Wt 241.8 lb

## 2014-09-22 DIAGNOSIS — I10 Essential (primary) hypertension: Secondary | ICD-10-CM

## 2014-09-22 DIAGNOSIS — E538 Deficiency of other specified B group vitamins: Secondary | ICD-10-CM

## 2014-09-22 DIAGNOSIS — R739 Hyperglycemia, unspecified: Secondary | ICD-10-CM

## 2014-09-22 DIAGNOSIS — E039 Hypothyroidism, unspecified: Secondary | ICD-10-CM | POA: Diagnosis not present

## 2014-09-22 DIAGNOSIS — Z0000 Encounter for general adult medical examination without abnormal findings: Secondary | ICD-10-CM

## 2014-09-22 LAB — BASIC METABOLIC PANEL
BUN: 21 mg/dL (ref 6–23)
CO2: 25 mEq/L (ref 19–32)
CREATININE: 0.7 mg/dL (ref 0.40–1.20)
Calcium: 9 mg/dL (ref 8.4–10.5)
Chloride: 109 mEq/L (ref 96–112)
GFR: 96.25 mL/min (ref >60.00)
Glucose, Bld: 82 mg/dL (ref 70–99)
POTASSIUM: 3.7 meq/L (ref 3.5–5.1)
Sodium: 138 mEq/L (ref 135–145)

## 2014-09-22 LAB — VITAMIN B12: VITAMIN B 12: 527 pg/mL (ref 211–911)

## 2014-09-22 LAB — HEMOGLOBIN A1C: HEMOGLOBIN A1C: 5.6 % (ref 4.6–6.5)

## 2014-09-22 LAB — TSH: TSH: 1.8 u[IU]/mL (ref 0.35–4.50)

## 2014-09-22 NOTE — Assessment & Plan Note (Signed)
TSH 

## 2014-09-22 NOTE — Progress Notes (Signed)
   Subjective:    Patient ID: [name], female    DOB: 05-May-1970, 44 y.o.   MRN: [id number]  HPI  The note was inadvertently closed prior to correction of Dragon dictation errors.  She feels Topamax is causing a dry cough. She is on ACE inhibitor but does not relate the cough to the lisinopril.  Her mother recently had orthopedic surgery; she has been providing direct patient care. Because of this she has interrupted her exercising at the gym which had been on high 3 times a week schedule.  She has developed some right inferior thoracic and right shoulder discomfort after helping lift her mother. She has had no chest pain working out at [employer].  Her rheumatologist had performed CBC and differential and ANA in April. Plaquenil was discontinued because the ANA was negative. Because of an exacerbation of symptoms off the Plaquenil; it was reinitiated by her rheumatologist.  Her father had a myocardial infarction and mini stroke at 50.   Review of Systems     Objective:   Physical Exam  Inguinal lymphadenopathy was not assessed      Assessment & Plan:

## 2014-09-22 NOTE — Progress Notes (Signed)
   Subjective:    Patient ID: [name], female    DOB: 1970-09-16, 44 y.o.   MRN: [id number]  HPI  The patient is here to assess status of active health conditions.  PMH, FH, & Social History reviewed & updated.  She has been trying to get refills on her HCTZ and thyroid for 6 weeks without success.  She had been compliant with her medications without adverse effects otherwise. She had been started on Topamax which he feels is causing a dry cough mainly at night. She's never had a cough with lisinopril. New progress she is on a heart healthy diet and restrict salt. Her blood pressure averages 120-130s over less than 80. She had been excised 3 times a week at the gym without cardio pulmonary symptoms.  She's never smoked. She rarely drinks.  Her rheumatologist performed a enhance CBC and differential in April. The DNA was negative and placode was discontinued. Because of active symptoms it was restarted  Her father did have Melchor infarction of 84 and a mini stroke at the same age.   Review of Systems  She describes intermittent nosebleeds on the right related to allergic rhinitis. She also has itchy, watery eyes. She has no associated wheezing or upper respiratory tract infection symptoms. Chest pain, palpitations, tachycardia, exertional dyspnea, paroxysmal nocturnal dyspnea, claudication or edema are absent. No unexplained weight loss, abdominal pain, significant dyspepsia, dysphagia, melena, rectal bleeding, or persistently small caliber stools. Dysuria, pyuria, hematuria, frequency, nocturia or polyuria are denied. Change in hair, skin, nails denied. No bowel changes of constipation or diarrhea. No intolerance to heat or cold.     Objective:   Physical Exam   Pertinent or positive findings include: The right nasal mucosa is dry with some eschar. There is accentuation of the upper thoracic curvature. She has crepitus of the knees. General appearance :adequately nourished;  in no distress.  Eyes: No conjunctival inflammation or scleral icterus is present.  Oral exam:  Lips and gums are healthy appearing.There is no oropharyngeal erythema or exudate noted. Dental hygiene is good.  Heart:  Normal rate and regular rhythm. S1 and S2 normal without gallop, murmur, click, rub or other extra sounds    Lungs:Chest clear to auscultation; no wheezes, rhonchi,rales ,or rubs present.No increased work of breathing.   Abdomen: bowel sounds normal, soft and non-tender without masses, organomegaly or hernias noted.  No guarding or rebound. No flank tenderness to percussion.  Vascular : all pulses equal ; no bruits present.  Skin:Warm & dry.  Intact without suspicious lesions or rashes ; no tenting or jaundice   Lymphatic: No lymphadenopathy is noted about the head, neck, axilla, or inguinal areas.   Neuro: Strength, tone & DTRs normal.        Assessment & Plan:  See Current Assessment & Plan in Problem List under specific Diagnosis

## 2014-09-22 NOTE — Progress Notes (Signed)
Subjective:   [name] is a 44 y.o. female who presents for Medicare Annual (Subsequent) preventive examination.  Review of Systems:  HRA assessment completed during visit;  Patient is here for Annual Wellness Assessment prior to doctor apt. The goal of the wellness visit is to assist the patient how to close the gaps in care and create a preventative care plan for the patient.  Risk; disability from Connective tissue disease; currently under treatment by [facility] Eye exam q year due to therapy with Plaquenil; A1c 5.8; Jan 2015; Wt BMI 36;    Problem list review for risk  Lipids 05/2014; HDL 63; LDL 88; chol 170; trig 96  BMI: The BMI (body mass index) is calculated from your height and weight. BMI is an estimate of body fat and a good gauge of your risk for diseases that can occur with more body fat. The higher your BMI, the more risk you have for heart disease, high blood pressure, type 2 diabetes, gallstones, breathing problems and certain cancers. Wt; labs; BP; exercise; cigarette smoking and family hx / seat belt use Diet;  Exercise; down to 229 but now has neck pain [facility]'t [facility];  Taking care of mother and father in law right now    Family Hx; HOH; heart disease; DM; stroke Social history: Just married x 9 months ago  Personalized education given regarding risk  for stress and weight loss Psychosocial support; safe community; firearm safety; smoke alarms;    Discussed Goal to improve health based on risk   Screenings overdue Ophthalmology exam; 07/2014 Immunizations; HIV screening; no risk/ referred to GYN Shingles; not due  Colonoscopy; IBS dx  EKG ; 06/2013  Dexa scan; not due Mammogram; Sept 2014; also had one last at Dr. Marvel Plan; and will have one this year Hearing: 4000 hz Dental: regular dental work  Gave information on safety to take home;   Current Care Team reviewed and updated       Objective:     Vitals: BP 152/82  mmHg  Pulse 65  Temp(Src) 98.5 F (36.9 C) (Oral)  Ht 5\' 8"  (1.727 m)  Wt 241 lb 12 oz (109.657 kg)  BMI 36.77 kg/m2  SpO2 98%  Tobacco History  Smoking status  . Never Smoker   Smokeless tobacco  . Never Used     Counseling given: Yes   Past Medical History  Diagnosis Date  . Hypothyroidism   . Hypertension     a  . Anxiety   . Depression     during divorce & legal matters  . Osteoarthritis   . GERD (gastroesophageal reflux disease)   . IBS (irritable bowel syndrome)   . Lupus hx positive ANA    followed by Dr Gavin Pound; possible lupus  . History of polycystic ovarian disease S/P BSO  . Polyarthritis, inflammatory   . Chronic joint pain   . Myalgia   . History of kidney stones     Dr Phebe Colla  . Hx of anxiety disorder    Past Surgical History  Procedure Laterality Date  . Eye surgery      Retinal tears surgery bilateral  . Tonsillectomy    . Carpal tunnel release      bilateral; Dr Sherwood Gambler  . Plantar fascia surgery      right  . Plantar fascia surgery  02/2011    left  . Cystoscopy w/ ureteral stent placement  11/12/2011  Procedure: CYSTOSCOPY WITH RETROGRADE PYELOGRAM/URETERAL STENT PLACEMENT;  Surgeon: Alexis Frock, MD;  Location: [facility];  Service: Urology;  Laterality: Right;  . Laparoscopic cholecystectomy  05-31-2007  . Cysto/ right retrograde ureteral pyelogram  07-09-2004    HX BILATERAL RENAL STONES/ RIGHT FLANK PAIN  . Excision left bartholin gland  02-05-2002  . Laparoscopy with right salpingo-oophectomy/ lysis adhesions and ablation endometriosis  05-17-2001  . Right ureteroscopic stone extraction  08-04-2000  . Laparoscopic laser ablation endometriosis and left salpingo-oophorectomy  11-03-1999  . Laparoscopic assisted vaginal hysterectomy  2000  . Dilation and curettage of uterus      X5  . Ankle surgery      X3  . Appendectomy  1991    exploratory lap  . Lumbar laminectomy  2003    L4 - L5  . Ureteral stent placement   11/12/11    Dr Tresa Moore  . Cystoscopy w/ ureteral stent removal  12/07/2011    Procedure: CYSTOSCOPY WITH STENT REMOVAL;  Surgeon: Alexis Frock, MD;  Location: [facility];  Service: Urology;  Laterality: Right;  . Cystoscopy/retrograde/ureteroscopy/stone extraction with basket  12/07/2011    Procedure: CYSTOSCOPY/RETROGRADE/URETEROSCOPY/STONE EXTRACTION WITH BASKET;  Surgeon: Alexis Frock, MD;  Location: [facility];  Service: Urology;  Laterality: Right;  . Colonoscopy      in 1990s   Family History  Problem Relation Age of Onset  . Hypertension Mother   . Colon polyps Mother   . Atrial fibrillation Mother     Dr Angelena Form  . Heart attack Father 61  . Diabetes Father   . Hypertension Sister   . Heart attack Paternal Grandmother 82  . Breast cancer Paternal Aunt   . Colon cancer Maternal Uncle   . Colon cancer Paternal Uncle   . Colon polyps Maternal Grandmother   . Stroke Maternal Grandmother 76  . Diabetes Paternal Grandfather   . Stroke Maternal Grandfather 37   History  Sexual Activity  . Sexual Activity: Not on file    Outpatient Encounter Prescriptions as of 09/22/2014  Medication Sig  . aspirin 81 MG tablet Take 81 mg by mouth daily.   . celecoxib (CELEBREX) 200 MG capsule Take 200 capsules by mouth 2 (two) times daily.   . cholecalciferol (VITAMIN D) 1000 UNITS tablet Take 1,000 Units by mouth daily.  . citalopram (CELEXA) 20 MG tablet TAKE 1 TABLET EVERY DAY  . fish oil-omega-3 fatty acids 1000 MG capsule Take 1 g by mouth daily.  [name] gabapentin (NEURONTIN) 300 MG capsule Take 2 capsules (600 mg total) by mouth 3 (three) times daily.  . hydrochlorothiazide (MICROZIDE) 12.5 MG capsule Take 1 capsule (12.5 mg total) by mouth daily.  . hydroxychloroquine (PLAQUENIL) 200 MG tablet Take 200 mg by mouth 2 (two) times daily.   [name] lisinopril (PRINIVIL,ZESTRIL) 40 MG tablet TAKE 1 TABLET EVERY DAY (NEED APPOINTMENT)  . omeprazole (PRILOSEC) 40 MG  capsule TAKE 1 CAPSULE EVERY MORNING  . tiZANidine (ZANAFLEX) 4 MG tablet Take 4 mg by mouth once.  . topiramate (TOPAMAX) 100 MG tablet Take 100 mg by mouth.  . estradiol (ESTRACE) 1 MG tablet Take 1.5 mg by mouth daily.  [name] HYDROcodone-acetaminophen (NORCO/VICODIN) 5-325 MG per tablet Take 1 tablet by mouth every 12 (twelve) hours as needed for moderate pain. (Patient not taking: Reported on 09/22/2014)  . levothyroxine (SYNTHROID, LEVOTHROID) 112 MCG tablet Take 1 tablet (112 mcg total) by mouth every morning. (Patient not taking: Reported on 09/22/2014)   No facility-administered  encounter medications on file as of 09/22/2014.    Activities of Daily Living In your present state of health, do you have any difficulty performing the following activities: 09/22/2014  Hearing? N  Vision? N  Difficulty concentrating or making decisions? N  Walking or climbing stairs? N  Dressing or bathing? N  Doing errands, shopping? N  Preparing Food and eating ? N  Using the Toilet? N  In the past six months, have you accidently leaked urine? N  Do you have problems with loss of bowel control? N  Managing your Medications? N  Managing your Finances? N  Housekeeping or managing your Housekeeping? N    Patient Care Team: Hendricks Limes, MD as PCP - General Gavin Pound, MD as Consulting Physician (Rheumatology)    Assessment:    Objective:   Personalized Education was given regarding: stress reduction and continued weight loss  Pt determined a personalized goal; see patient goals; weight loss  Assessment included: discussions of lipids   Taking meds without issues; no barriers identified Labs were and fup visit noted with MD if labs are due to be re-drawn. Stress: Recommendations for managing stress if assessed as a factor;  No Risk for hepatitis or high risk social behavior identified via hepatitis screen deferred Educated on Vaccines;  Safety issues reviewed; Cognition assessed by AD8;  Score 0 deferred / no memory issues noted at this time   Exercise Activities and Dietary recommendations    Goals    . 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 BMI is an estimate of body fat and a good gauge of your risk for diseases that can occur with more body fat. The higher your BMI, the more risk you have for heart disease, high blood pressure, type 2 diabetes, gallstones, breathing problems and certain cancers.   A high BMI in conjunction with other risk put you at greater risk for heart disease and other conditions Risk include High Blood pressure High LDL  (bad cholesterol) Low HDL (good cholesterol) High Triglycerides High Blood Glucose Family hx of premature heart disease Physical Inactivity  Cigarette smoking  Even a small weight loss will help lower your risk of developing disease associated with obesity.  Your goal today is based on decreasing one of these risk.        Fall Risk Fall Risk  09/22/2014  Falls in the past year? No   Depression Screen PHQ 2/9 Scores 09/22/2014  PHQ - 2 Score 0     Cognitive Testing MMSE - Mini Mental State Exam 09/22/2014  Not completed: Unable to complete    Immunization History  Administered Date(s) Administered  . Influenza Split 02/17/2011  . Influenza Whole 01/02/2010  . Influenza,inj,Quad PF,36+ Mos 01/02/2013  . Influenza-Unspecified 01/01/2014  . Pneumococcal Polysaccharide-23 06/04/2008, 05/02/2013  . Tdap 08/14/2012   Screening Tests Health Maintenance  Topic Date Due  . HIV Screening  11/19/1984  . INFLUENZA VACCINE  11/03/2014  . TETANUS/TDAP  08/15/2022      Plan:   1. Given information on stress 2. The patient will continue her plan for weight loss;  s/p neck pain resolution and caregiving resolved; During the course of the visit  the patient was educated and counseled about the following appropriate screening and preventive services:   Vaccines to include Pneumoccal, Influenza, Hepatitis B, Td, Zostavax, HCV  Electrocardiogram  Cardiovascular Disease  Colorectal cancer screening  Bone density screening  Diabetes screening  Glaucoma screening  Mammography/PAP  Nutrition counseling   Patient Instructions (the written plan) was given to the patient.   Wynetta Fines, RN  09/22/2014

## 2014-09-22 NOTE — Addendum Note (Signed)
Addended byUnice Cobble F on: 09/22/2014 11:12 AM   Modules accepted: Orders

## 2014-09-22 NOTE — Assessment & Plan Note (Signed)
A1c

## 2014-09-22 NOTE — Addendum Note (Signed)
Addended by: Terence Lux B on: 09/22/2014 10:47 AM   Modules accepted: Medications

## 2014-09-22 NOTE — Addendum Note (Signed)
Addended byUnice Cobble F on: 09/22/2014 05:50 PM   Modules accepted: Level of Service

## 2014-09-22 NOTE — Assessment & Plan Note (Signed)
Blood pressure goals reviewed. BMET 

## 2014-09-22 NOTE — Progress Notes (Signed)
   Subjective:    Patient ID: [name], female    DOB: [birth year], 44 y.o.   MRN: [id number]  HPI    Review of Systems     Objective:   Physical Exam        Assessment & Plan:

## 2014-09-22 NOTE — Assessment & Plan Note (Signed)
B12 level 

## 2014-09-22 NOTE — Patient Instructions (Addendum)
[name] , Thank you for taking time to come for your Medicare Wellness Visit. I appreciate your ongoing commitment to your health goals. Please review the following plan we discussed and let me know if I can assist you in the future.   These are the goals we discussed: Goals    . 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 BMI is an estimate of body fat and a good gauge of your risk for diseases that can occur with more body fat. The higher your BMI, the more risk you have for heart disease, high blood pressure, type 2 diabetes, gallstones, breathing problems and certain cancers.   A high BMI in conjunction with other risk put you at greater risk for heart disease and other conditions Risk include High Blood pressure High LDL  (bad cholesterol) Low HDL (good cholesterol) High Triglycerides High Blood Glucose Family hx of premature heart disease Physical Inactivity  Cigarette smoking  Even a small weight loss will help lower your risk of developing disease associated with obesity.  Your goal today is based on decreasing one of these risk.         This is a list of the screening recommended for you and due dates:  Health Maintenance  Topic Date Due  . HIV Screening  11/19/1984  . Flu Shot  11/03/2014  . Tetanus Vaccine  08/15/2022   Stress level is normally 8 (1-10) so now caregiving is adding some issues; and pain so can't exercise   Stress Stress-related medical problems are becoming increasingly common. The body has a built-in physical response to stressful situations. Faced with pressure, challenge or danger, we need to react quickly. Our bodies release hormones such as cortisol and adrenaline to help do this. These hormones  are part of the "fight or flight" response and affect the metabolic rate, heart rate and blood pressure, resulting in a heightened, stressed state that prepares the body for optimum performance in dealing with a stressful situation. It is likely that early man required these mechanisms to stay alive, but usually modern stresses do not call for this, and the same hormones released in today's world can damage health and reduce coping ability. CAUSES  Pressure to perform at work, at school or in sports.  Threats of physical violence.  Money worries.  Arguments.  Family conflicts.  Divorce or separation from significant other.  Bereavement.  New job or unemployment.  Changes in location.  Alcohol or drug abuse. SOMETIMES, THERE IS NO PARTICULAR REASON FOR DEVELOPING STRESS. Almost all people are at risk of being stressed at some time in their lives. It is important to know that some stress is temporary and some is long term.  Temporary stress will go away when a situation is resolved. Most people can cope with short periods of stress, and it can often be relieved by relaxing, taking a walk or getting any type of exercise, chatting through issues with friends, or having a good night's sleep.  Chronic (long-term, continuous) stress is much harder to deal with. It can be psychologically and emotionally damaging. It can be harmful both for an individual and for friends and family. SYMPTOMS Everyone reacts to stress  differently. There are some common effects that help [place] recognize it. In times of extreme stress, people may:  Shake uncontrollably.  Breathe faster and deeper than normal (hyperventilate).  Vomit.  For people with asthma, stress can trigger an attack.  For some people, stress may trigger migraine headaches, ulcers, and body pain. PHYSICAL EFFECTS OF STRESS MAY INCLUDE:  Loss of energy.  Skin problems.  Aches and pains resulting from tense muscles, including neck  ache, backache and tension headaches.  Increased pain from arthritis and other conditions.  Irregular heart beat (palpitations).  Periods of irritability or anger.  Apathy or depression.  Anxiety (feeling uptight or worrying).  Unusual behavior.  Loss of appetite.  Comfort eating.  Lack of concentration.  Loss of, or decreased, sex-drive.  Increased smoking, drinking, or recreational drug use.  For women, missed periods.  Ulcers, joint pain, and muscle pain. Post-traumatic stress is the stress caused by any serious accident, strong emotional damage, or extremely difficult or violent experience such as rape or war. Post-traumatic stress victims can experience mixtures of emotions such as fear, shame, depression, guilt or anger. It may include recurrent memories or images that may be haunting. These feelings can last for weeks, months or even years after the traumatic event that triggered them. Specialized treatment, possibly with medicines and psychological therapies, is available. If stress is causing physical symptoms, severe distress or making it difficult for you to function as normal, it is worth seeing your caregiver. It is important to remember that although stress is a usual part of life, extreme or prolonged stress can lead to other illnesses that will need treatment. It is better to visit a doctor sooner rather than later. Stress has been linked to the development of high blood pressure and heart disease, as well as insomnia and depression. There is no diagnostic test for stress since everyone reacts to it differently. But a caregiver will be able to spot the physical symptoms, such as:  Headaches.  Shingles.  Ulcers. Emotional distress such as intense worry, low mood or irritability should be detected when the doctor asks pertinent questions to identify any underlying problems that might be the cause. In case there are physical reasons for the symptoms, the doctor may  also want to do some tests to exclude certain conditions. If you feel that you are suffering from stress, try to identify the aspects of your life that are causing it. Sometimes you may not be able to change or avoid them, but even a small change can have a positive ripple effect. A simple lifestyle change can make all the difference. STRATEGIES THAT CAN HELP DEAL WITH STRESS:  Delegating or sharing responsibilities.  Avoiding confrontations.  Learning to be more assertive.  Regular exercise.  Avoid using alcohol or street drugs to cope.  Eating a healthy, balanced diet, rich in fruit and vegetables and proteins.  Finding humor or absurdity in stressful situations.  Never taking on more than you know you can handle comfortably.  Organizing your time better to get as much done as possible.  Talking to friends or family and sharing your thoughts and fears.  Listening to music or relaxation tapes.  Relaxation techniques like deep breathing, meditation, and yoga.  Tensing and then relaxing your muscles, starting at the toes and working up to the head and neck. If you think that you would benefit from help, either in identifying the things that are causing your stress or in learning techniques to help you relax,  see a caregiver who is capable of helping you with this. Rather than relying on medications, it is usually better to try and identify the things in your life that are causing stress and try to deal with them. There are many techniques of managing stress including counseling, psychotherapy, aromatherapy, yoga, and exercise. Your caregiver can help you determine what is best for you. Document Released: 06/11/2002 Document Revised: 03/26/2013 Document Reviewed: 05/08/2007 [facility] Patient Information 2015 [place], [place]. This information is not intended to replace advice given to you by your health care provider. Make sure you discuss any questions you have with your health care  provider.    Your next office appointment will be determined based upon review of your pending labs . Those written interpretation of the lab results and instructions will be transmitted to you by My Chart Critical results will be called. Followup as needed for any active or acute issue. Please report any significant change in your symptoms.  Plain Mucinex (NOT D) for thick secretions ;force NON dairy fluids .   Nasal cleansing in the shower as discussed with lather of mild shampoo.After 10 seconds wash off lather while  exhaling through nostrils. Make sure that all residual soap is removed to prevent irritation.  Flonase OR Nasacort AQ 1 spray in each nostril twice a day as needed. Use the "crossover" technique into opposite nostril spraying toward opposite ear @ 45 degree angle, not straight up into nostril.  Plain Allegra (NOT D )  160 daily , Loratidine 10 mg , OR Zyrtec 10 mg @ bedtime  as needed for itchy eyes & sneezing.

## 2014-11-15 ENCOUNTER — Other Ambulatory Visit: Payer: Self-pay | Admitting: Cardiovascular Disease

## 2014-11-20 ENCOUNTER — Other Ambulatory Visit: Payer: Self-pay | Admitting: Internal Medicine

## 2014-11-22 ENCOUNTER — Encounter: Payer: Self-pay | Admitting: Internal Medicine

## 2014-11-24 ENCOUNTER — Telehealth: Payer: Self-pay | Admitting: Cardiovascular Disease

## 2014-11-24 ENCOUNTER — Other Ambulatory Visit: Payer: Self-pay | Admitting: Internal Medicine

## 2014-11-24 MED ORDER — CITALOPRAM HYDROBROMIDE 20 MG PO TABS: 20.0000 mg | ORAL_TABLET | Freq: Every day | ORAL | Status: DC

## 2014-11-24 NOTE — Telephone Encounter (Signed)
Pt was seen in primary care recently.  Will forward this note to Dr. Linna Darner to see if his office would refill lisinopril.  Pt has not been seen by Dr. Angelena Form in over 2 years.

## 2014-11-24 NOTE — Telephone Encounter (Signed)
This pt was last seen by Dr Angelena Form 09/25/2012. The pt has been given warning to contact the office for appointment prior to further refills. No pending appointment at this time.

## 2014-11-25 ENCOUNTER — Other Ambulatory Visit: Payer: Self-pay | Admitting: Emergency Medicine

## 2014-11-25 ENCOUNTER — Other Ambulatory Visit: Payer: Self-pay

## 2014-11-25 MED ORDER — LISINOPRIL 40 MG PO TABS: ORAL_TABLET | ORAL | Status: DC

## 2014-11-25 NOTE — Telephone Encounter (Signed)
Patient requesting a refill of Lisinopril 40mg , however she has not been here in 2 years. In looking at med list, it was already refilled today by Dr. Clayborn Heron office.

## 2014-11-25 NOTE — Telephone Encounter (Signed)
Lisinopril has been sent in to Mail order

## 2014-11-25 NOTE — Telephone Encounter (Signed)
OK X 3 months

## 2014-11-25 NOTE — Telephone Encounter (Signed)
Refill has been sent by Dr Clayborn Heron office.

## 2014-11-26 ENCOUNTER — Telehealth: Payer: Self-pay | Admitting: Internal Medicine

## 2014-11-26 NOTE — Telephone Encounter (Signed)
Error:[id number] ° °

## 2014-12-12 ENCOUNTER — Ambulatory Visit: Payer: Commercial Managed Care - HMO | Attending: Audiology | Admitting: Audiology

## 2014-12-12 DIAGNOSIS — R94120 Abnormal auditory function study: Secondary | ICD-10-CM | POA: Diagnosis not present

## 2014-12-12 NOTE — Procedures (Signed)
[name], [name]  [id number] [name] EVALUATION  Patient Name: [name]  Medical Record Number:  [id number] Date of Birth:  July 17, 1970     Date of Test:  12/12/2014  HISTORY:  [name], a 44 y.o. old female was seen for audiological evaluation upon referral of Dr.William Linna Darner, MD and Dr. Melony Overly, ENT.   The patient reported a positive familial history of hearing loss (sister, father, mother wear hearing aids), frequent bouts of swimmer's ear (most recent was June 2016) high blood pressure, Lupus, severe osteoarthritis and chronic pain.  She denies tinnitus and facial numbness but does mention positional vertigo that feels as though the room is spinning, whenever she is laying flat and turns her head to to the left. She states that this began at the same time C5 and C6 vertebra were compressing.  Currently she is on steroid injections for management of the neck pain. Her hearing as never been evaluated and she does not complain of hearing difficulty, however her family complains that she speaks loudly.   REPORT OF PAIN:  None  EVALUATION:   Air and bone conduction audiometry from 500Hz  - 8000Hz  utilizing insert and standard earphones revealed normal hearing acuity bilaterally.   Speech reception thresholds were consistent with the pure tone results indicative of good test reliability.  Speech recognition testing was conducted in each ear independently, at a comfortable listening level (55dBHL) and indicated 100% and 96% in the right and left ears respectively.  Speech recognition abilities while in the presence of a soft background noise (s/n+5) were also evaluated.  Under this condition, scores were 72% in the right and 72% in the left ear.  Immittance Audiometry was utilized and Type A Tympanograms were obtained on the the right and left sides indicating normal ear canal volume,  tympanic membrane compliance and pressure. Acoustic reflexes were tested from 500Hz  - 4000Hz  and were absent on the right side and absent on the left side.  Distortion Product Otoacoustic Emissions were tested from 2000Hz  - 10000Hz  and were present however weaker above 4000Hz  on both sides.   CONCLUSION:   Normal hearing acuity with excellent speech recognition in quiet which drops to moderately impaired when a background noise in present.  DPOAEs are present however weaker above 4000Hz  on both sides indicating that those hair cells within the inner ear are not as healthy.    RECOMMENDATIONS:    1. Hearing should be monitored on an annual basis due to the weak high frequency DPOAEs and familial history of hearing loss. 2. Please follow up with Dr. Lucia Gaskins regarding the possibility of benign positional vertigo. 3. Although her hearing acuity is still within the normal range, there are ways to improve one's listening environment. Such as:  (A) When in a restaurant, sit with your back against a perimeter wall, thus reducing the extraneous noise.   (B) When in meetings, do not sit near an open doorway and position yourself within 8-10ft of the speaker. Front and center is typically best.  (C) Eliminate background noises at home such as the television, dishwasher, etc when having important conversations.  (D) Ask family members to face you when they speak and not to drop their heads or turn and walkway while still speaking.   4. Because [name] has weakened high frequency hair cells in the inner ear,  she is more at risk from damage of noise exposure  and therefore should  avoid excessive nose exposure if possible or certainly utilize ear protection.     [name], Au.D. Doctor of Audiology CCC-A 12/12/2014

## 2014-12-12 NOTE — Patient Instructions (Signed)
RECOMMENDATIONS:    1. Hearing should be monitored on an annual basis due to the weak high frequency DPOAEs and familial history of hearing loss. 2. Please follow up with Dr. Lucia Gaskins regarding the possibility of benign positional vertigo. 3. Although her hearing acuity is still within the normal range, there are ways to improve one's listening environment. Such as:  (A) When in a restaurant, sit with your back against a perimeter wall, thus reducing the extraneous noise.   (B) When in meetings, do not sit near an open doorway and position yourself within 8-10ft of the speaker. Front and center is typically best.  (C) Eliminate background noises at home such as the television, dishwasher, etc when having important conversations.  (D) Ask family members to face you when they speak and not to drop their heads or turn and walkway while still speaking.   4. Because Clarrisa has weakened high frequency hair cells in the inner ear,  she is more at risk from damage of noise exposure  and therefore should avoid excessive nose exposure if possible or certainly utilize ear protection.     Ivonne Andrew Pugh, Au.D. Doctor of Audiology CCC-A 12/12/2014

## 2015-01-21 ENCOUNTER — Other Ambulatory Visit: Payer: Self-pay | Admitting: Cardiovascular Disease

## 2015-01-21 ENCOUNTER — Other Ambulatory Visit: Payer: Self-pay | Admitting: Internal Medicine

## 2015-01-21 ENCOUNTER — Encounter: Payer: Self-pay | Admitting: Internal Medicine

## 2015-01-22 ENCOUNTER — Other Ambulatory Visit: Payer: Self-pay | Admitting: Cardiovascular Disease

## 2015-01-22 ENCOUNTER — Other Ambulatory Visit: Payer: Self-pay | Admitting: Emergency Medicine

## 2015-01-22 MED ORDER — CITALOPRAM HYDROBROMIDE 20 MG PO TABS: 20.0000 mg | ORAL_TABLET | Freq: Every day | ORAL | Status: DC

## 2015-01-22 NOTE — Telephone Encounter (Signed)
Should pt go back on her BP med? Please advise

## 2015-01-28 ENCOUNTER — Other Ambulatory Visit: Payer: Self-pay | Admitting: *Deleted

## 2015-01-28 MED ORDER — HYDROCHLOROTHIAZIDE 12.5 MG PO CAPS: 12.5000 mg | ORAL_CAPSULE | Freq: Every day | ORAL | Status: DC

## 2015-02-03 DIAGNOSIS — N62 Hypertrophy of breast: Secondary | ICD-10-CM | POA: Insufficient documentation

## 2015-02-05 ENCOUNTER — Other Ambulatory Visit: Payer: Self-pay | Admitting: Emergency Medicine

## 2015-02-05 MED ORDER — HYDROCHLOROTHIAZIDE 12.5 MG PO CAPS: 12.5000 mg | ORAL_CAPSULE | Freq: Every day | ORAL | Status: DC

## 2015-02-05 MED ORDER — LISINOPRIL 40 MG PO TABS: ORAL_TABLET | ORAL | Status: DC

## 2015-02-16 ENCOUNTER — Encounter: Payer: Self-pay | Admitting: Internal Medicine

## 2015-02-16 ENCOUNTER — Other Ambulatory Visit (INDEPENDENT_AMBULATORY_CARE_PROVIDER_SITE_OTHER): Payer: Commercial Managed Care - HMO

## 2015-02-16 ENCOUNTER — Ambulatory Visit (INDEPENDENT_AMBULATORY_CARE_PROVIDER_SITE_OTHER): Payer: Commercial Managed Care - HMO | Admitting: Internal Medicine

## 2015-02-16 VITALS — BP 120/70 | HR 69 | Temp 98.4°F | Ht 68.0 in | Wt 255.2 lb

## 2015-02-16 DIAGNOSIS — L259 Unspecified contact dermatitis, unspecified cause: Secondary | ICD-10-CM | POA: Diagnosis not present

## 2015-02-16 DIAGNOSIS — R7989 Other specified abnormal findings of blood chemistry: Secondary | ICD-10-CM

## 2015-02-16 DIAGNOSIS — I10 Essential (primary) hypertension: Secondary | ICD-10-CM

## 2015-02-16 NOTE — Progress Notes (Signed)
   Subjective:    Patient ID: [name], female    DOB: 10-29-1970, 44 y.o.   MRN: [id number]  HPI  She has been out of her HCTZ since August. She has been taking her husband's lisinopril 40 mg daily as he was taken off this medicine. She has 8 days left.  The blood pressure has ranged from 132/70-154/95. The blood pressure did jump after her mother's recent episode of abdominal pain which required extensive evaluation.  She denies any active cardio pulmonary symptoms. She's not been exercising.  She rarely will have some discomfort in the left leg which she relates to lumbar radiculopathy  In the last 2 weeks she noted erythema over the upper lids. She also has some itchy, watery eyes and sneezing.  She has been off thyroid supplement and questions her thyroid status.  Review of Systems  Chest pain, palpitations, tachycardia, exertional dyspnea, paroxysmal nocturnal dyspnea, claudication or edema are absent.  There is no significant cough, sputum production, wheezing,or  paroxysmal nocturnal dyspnea.    Objective:   Physical Exam Pertinent or positive findings include: There is faint erythema over the upper lids. The left ring finger has some dryness of the skin over the PIP joint.  General appearance :adequately nourished; in no distress.  Eyes: No conjunctival inflammation or scleral icterus is present.  Oral exam:  Lips and gums are healthy appearing.There is no oropharyngeal erythema or exudate noted. Dental hygiene is good.  Heart:  Normal rate and regular rhythm. S1 and S2 normal without gallop, murmur, click, rub or other extra sounds    Lungs:Chest clear to auscultation; no wheezes, rhonchi,rales ,or rubs present.No increased work of breathing.   Abdomen: bowel sounds normal, soft and non-tender without masses, organomegaly or hernias noted.  No guarding or rebound. No flank tenderness to percussion.  Vascular : all pulses equal ; no bruits  present.  Skin:Warm & dry.  Intact without suspicious lesions or rashes ; no tenting or jaundice   Lymphatic: No lymphadenopathy is noted about the head, neck, axilla.   Neuro: Strength, tone & DTRs normal.         Assessment & Plan:  #1 essential hypertension.  #2 probable contact dermatitis of the upper lids from hairspray   #3 PMH of hypothyroidism; questionable status off supplement.  Plan: See orders and recommendations

## 2015-02-16 NOTE — Patient Instructions (Addendum)
Minimal Blood Pressure Goal= AVERAGE < 140/90;  Ideal is an AVERAGE < 135/85. This AVERAGE should be calculated from @ least 5-7 BP readings taken @ different times of day on different days of week. You should not respond to isolated BP readings , but rather the AVERAGE for that week .Please bring your  blood pressure cuff to office visits to verify that it is reliable.It  can also be checked against the blood pressure device at the pharmacy. Finger or wrist cuffs are not dependable; an arm cuff is.  Plain Mucinex (NOT D) for thick secretions ;force NON dairy fluids .   Nasal cleansing in the shower as discussed with lather of mild shampoo.After 10 seconds wash off lather while  exhaling through nostrils. Make sure that all residual soap is removed to prevent irritation.  Flonase OR Nasacort AQ 1 spray in each nostril twice a day as needed. Use the "crossover" technique into opposite nostril spraying toward opposite ear @ 45 degree angle, not straight up into nostril.  Plain Allegra (NOT D )  160 daily , Loratidine 10 mg , OR Zyrtec 10 mg @ bedtime  as needed for itchy eyes & sneezing.  Use  Aveeno Daily  Moisturizing Lotion  twice a day  for the dry skin. Bathe with moisturizing liquid soap , not bar soap. Apply Cort Aid OTC twice a day to the involved tissues if itching. Do not get this topical steroid into eyes.

## 2015-02-16 NOTE — Progress Notes (Signed)
Pre visit review using our clinic review tool, if applicable. No additional management support is needed unless otherwise documented below in the visit note. 

## 2015-02-17 ENCOUNTER — Encounter: Payer: Self-pay | Admitting: Internal Medicine

## 2015-02-17 LAB — BASIC METABOLIC PANEL
BUN: 20 mg/dL (ref 6–23)
CALCIUM: 9.3 mg/dL (ref 8.4–10.5)
CO2: 27 mEq/L (ref 19–32)
Chloride: 108 mEq/L (ref 96–112)
Creatinine, Ser: 0.7 mg/dL (ref 0.40–1.20)
GFR: 96.07 mL/min (ref >60.00)
GLUCOSE: 86 mg/dL (ref 70–99)
Potassium: 4.3 mEq/L (ref 3.5–5.1)
Sodium: 142 mEq/L (ref 135–145)

## 2015-02-17 LAB — TSH: TSH: 2.45 u[IU]/mL (ref 0.35–4.50)

## 2015-03-06 ENCOUNTER — Encounter (HOSPITAL_BASED_OUTPATIENT_CLINIC_OR_DEPARTMENT_OTHER): Payer: Self-pay | Admitting: *Deleted

## 2015-03-10 ENCOUNTER — Other Ambulatory Visit: Payer: Self-pay

## 2015-03-10 ENCOUNTER — Encounter (HOSPITAL_BASED_OUTPATIENT_CLINIC_OR_DEPARTMENT_OTHER)
Admission: RE | Admit: 2015-03-10 | Discharge: 2015-03-10 | Disposition: A | Discharge status: Home / Self Care | Payer: Commercial Managed Care - HMO | Source: Ambulatory Visit | Attending: Plastic Surgery | Admitting: Plastic Surgery

## 2015-03-10 ENCOUNTER — Other Ambulatory Visit: Payer: Self-pay | Admitting: Plastic Surgery

## 2015-03-10 DIAGNOSIS — Z01818 Encounter for other preprocedural examination: Secondary | ICD-10-CM | POA: Diagnosis not present

## 2015-03-10 DIAGNOSIS — N62 Hypertrophy of breast: Secondary | ICD-10-CM

## 2015-03-11 ENCOUNTER — Encounter (HOSPITAL_BASED_OUTPATIENT_CLINIC_OR_DEPARTMENT_OTHER): Payer: Self-pay | Admitting: Anesthesiology

## 2015-03-12 ENCOUNTER — Ambulatory Visit (HOSPITAL_BASED_OUTPATIENT_CLINIC_OR_DEPARTMENT_OTHER)
Admission: RE | Admit: 2015-03-12 | Payer: Commercial Managed Care - HMO | Source: Ambulatory Visit | Admitting: Plastic Surgery

## 2015-03-12 HISTORY — DX: Hypertrophy of breast: N62

## 2015-03-12 SURGERY — MAMMOPLASTY, REDUCTION
Anesthesia: General | Site: Breast | Laterality: Bilateral

## 2015-03-20 ENCOUNTER — Encounter (HOSPITAL_BASED_OUTPATIENT_CLINIC_OR_DEPARTMENT_OTHER): Payer: Self-pay | Admitting: *Deleted

## 2015-03-23 ENCOUNTER — Encounter (HOSPITAL_BASED_OUTPATIENT_CLINIC_OR_DEPARTMENT_OTHER)
Admission: RE | Admit: 2015-03-23 | Discharge: 2015-03-23 | Disposition: A | Discharge status: Home / Self Care | Payer: Commercial Managed Care - HMO | Source: Ambulatory Visit | Attending: Plastic Surgery | Admitting: Plastic Surgery

## 2015-03-23 DIAGNOSIS — E039 Hypothyroidism, unspecified: Secondary | ICD-10-CM | POA: Diagnosis not present

## 2015-03-23 DIAGNOSIS — M1990 Unspecified osteoarthritis, unspecified site: Secondary | ICD-10-CM | POA: Diagnosis not present

## 2015-03-23 DIAGNOSIS — K219 Gastro-esophageal reflux disease without esophagitis: Secondary | ICD-10-CM | POA: Diagnosis not present

## 2015-03-23 DIAGNOSIS — Z79899 Other long term (current) drug therapy: Secondary | ICD-10-CM | POA: Diagnosis not present

## 2015-03-23 DIAGNOSIS — M329 Systemic lupus erythematosus, unspecified: Secondary | ICD-10-CM | POA: Diagnosis not present

## 2015-03-23 DIAGNOSIS — Z7982 Long term (current) use of aspirin: Secondary | ICD-10-CM | POA: Diagnosis not present

## 2015-03-23 DIAGNOSIS — I10 Essential (primary) hypertension: Secondary | ICD-10-CM | POA: Diagnosis not present

## 2015-03-23 DIAGNOSIS — N62 Hypertrophy of breast: Secondary | ICD-10-CM | POA: Diagnosis not present

## 2015-03-23 DIAGNOSIS — J069 Acute upper respiratory infection, unspecified: Secondary | ICD-10-CM | POA: Diagnosis not present

## 2015-03-23 DIAGNOSIS — Z9049 Acquired absence of other specified parts of digestive tract: Secondary | ICD-10-CM | POA: Diagnosis not present

## 2015-03-23 DIAGNOSIS — F419 Anxiety disorder, unspecified: Secondary | ICD-10-CM | POA: Diagnosis not present

## 2015-03-23 LAB — BASIC METABOLIC PANEL
Anion gap: 6 (ref 5–15)
BUN: 12 mg/dL (ref 6–20)
CO2: 27 mmol/L (ref 22–32)
Calcium: 9.1 mg/dL (ref 8.9–10.3)
Chloride: 108 mmol/L (ref 101–111)
Creatinine, Ser: 0.65 mg/dL (ref 0.44–1.00)
GLUCOSE: 110 mg/dL — AB (ref 65–99)
POTASSIUM: 4.1 mmol/L (ref 3.5–5.1)
Sodium: 141 mmol/L (ref 135–145)

## 2015-03-26 ENCOUNTER — Ambulatory Visit (HOSPITAL_BASED_OUTPATIENT_CLINIC_OR_DEPARTMENT_OTHER)
Admission: RE | Admit: 2015-03-26 | Discharge: 2015-03-26 | Disposition: A | Discharge status: Home / Self Care | Payer: Commercial Managed Care - HMO | Source: Ambulatory Visit | Attending: Plastic Surgery | Admitting: Plastic Surgery

## 2015-03-26 ENCOUNTER — Ambulatory Visit (HOSPITAL_BASED_OUTPATIENT_CLINIC_OR_DEPARTMENT_OTHER): Payer: Commercial Managed Care - HMO | Admitting: Anesthesiology

## 2015-03-26 ENCOUNTER — Encounter (HOSPITAL_BASED_OUTPATIENT_CLINIC_OR_DEPARTMENT_OTHER): Payer: Self-pay | Admitting: *Deleted

## 2015-03-26 ENCOUNTER — Encounter (HOSPITAL_BASED_OUTPATIENT_CLINIC_OR_DEPARTMENT_OTHER)
Admission: RE | Disposition: A | Discharge status: Home / Self Care | Payer: Self-pay | Source: Ambulatory Visit | Attending: Plastic Surgery

## 2015-03-26 DIAGNOSIS — Z9049 Acquired absence of other specified parts of digestive tract: Secondary | ICD-10-CM | POA: Insufficient documentation

## 2015-03-26 DIAGNOSIS — I10 Essential (primary) hypertension: Secondary | ICD-10-CM | POA: Diagnosis not present

## 2015-03-26 DIAGNOSIS — M1990 Unspecified osteoarthritis, unspecified site: Secondary | ICD-10-CM | POA: Diagnosis not present

## 2015-03-26 DIAGNOSIS — M329 Systemic lupus erythematosus, unspecified: Secondary | ICD-10-CM | POA: Insufficient documentation

## 2015-03-26 DIAGNOSIS — K219 Gastro-esophageal reflux disease without esophagitis: Secondary | ICD-10-CM | POA: Diagnosis not present

## 2015-03-26 DIAGNOSIS — N62 Hypertrophy of breast: Secondary | ICD-10-CM | POA: Insufficient documentation

## 2015-03-26 DIAGNOSIS — F419 Anxiety disorder, unspecified: Secondary | ICD-10-CM | POA: Insufficient documentation

## 2015-03-26 DIAGNOSIS — J069 Acute upper respiratory infection, unspecified: Secondary | ICD-10-CM | POA: Insufficient documentation

## 2015-03-26 DIAGNOSIS — E039 Hypothyroidism, unspecified: Secondary | ICD-10-CM | POA: Insufficient documentation

## 2015-03-26 DIAGNOSIS — Z7982 Long term (current) use of aspirin: Secondary | ICD-10-CM | POA: Insufficient documentation

## 2015-03-26 DIAGNOSIS — Z79899 Other long term (current) drug therapy: Secondary | ICD-10-CM | POA: Insufficient documentation

## 2015-03-26 HISTORY — DX: Acute upper respiratory infection, unspecified: J06.9

## 2015-03-26 HISTORY — PX: LIPOSUCTION: SHX10

## 2015-03-26 HISTORY — PX: BREAST REDUCTION SURGERY: SHX8

## 2015-03-26 SURGERY — MAMMOPLASTY, REDUCTION
Anesthesia: General | Site: Breast | Laterality: Bilateral

## 2015-03-26 MED ORDER — LIDOCAINE-EPINEPHRINE 1 %-1:100000 IJ SOLN
INTRAMUSCULAR | Status: DC | PRN
  Administered 2015-03-26: 20 mL

## 2015-03-26 MED ORDER — GLYCOPYRROLATE 0.2 MG/ML IJ SOLN: 0.2000 mg | Freq: Once | INTRAMUSCULAR | Status: DC | PRN

## 2015-03-26 MED ORDER — EPINEPHRINE HCL 1 MG/ML IJ SOLN
INTRAMUSCULAR | Status: AC
  Filled 2015-03-26: qty 1

## 2015-03-26 MED ORDER — SCOPOLAMINE 1 MG/3DAYS TD PT72
1.0000 | MEDICATED_PATCH | Freq: Once | TRANSDERMAL | Status: AC | PRN
  Administered 2015-03-26: 1 via TRANSDERMAL
  Administered 2015-03-26: 1.5 mg via TRANSDERMAL

## 2015-03-26 MED ORDER — PROPOFOL 10 MG/ML IV BOLUS
INTRAVENOUS | Status: DC | PRN
  Administered 2015-03-26: 200 mg via INTRAVENOUS

## 2015-03-26 MED ORDER — LIDOCAINE HCL (CARDIAC) 20 MG/ML IV SOLN
INTRAVENOUS | Status: DC | PRN
  Administered 2015-03-26: 75 mg via INTRAVENOUS

## 2015-03-26 MED ORDER — ONDANSETRON HCL 4 MG/2ML IJ SOLN
INTRAMUSCULAR | Status: AC
  Filled 2015-03-26: qty 2

## 2015-03-26 MED ORDER — ACETAMINOPHEN 10 MG/ML IV SOLN
INTRAVENOUS | Status: AC
  Filled 2015-03-26: qty 100

## 2015-03-26 MED ORDER — SUFENTANIL CITRATE 50 MCG/ML IV SOLN
INTRAVENOUS | Status: AC
  Filled 2015-03-26: qty 1

## 2015-03-26 MED ORDER — PROPOFOL 500 MG/50ML IV EMUL
INTRAVENOUS | Status: AC
  Filled 2015-03-26: qty 50

## 2015-03-26 MED ORDER — SODIUM CHLORIDE 0.9 % IJ SOLN
INTRAMUSCULAR | Status: AC
  Filled 2015-03-26: qty 10

## 2015-03-26 MED ORDER — FENTANYL CITRATE (PF) 100 MCG/2ML IJ SOLN: 25.0000 ug | INTRAMUSCULAR | Status: DC | PRN

## 2015-03-26 MED ORDER — MIDAZOLAM HCL 2 MG/2ML IJ SOLN
INTRAMUSCULAR | Status: AC
  Filled 2015-03-26: qty 2

## 2015-03-26 MED ORDER — EPHEDRINE SULFATE 50 MG/ML IJ SOLN
INTRAMUSCULAR | Status: DC | PRN
  Administered 2015-03-26 (×2): 10 mg via INTRAVENOUS

## 2015-03-26 MED ORDER — SUCCINYLCHOLINE CHLORIDE 20 MG/ML IJ SOLN
INTRAMUSCULAR | Status: DC | PRN
  Administered 2015-03-26: 140 mg via INTRAVENOUS

## 2015-03-26 MED ORDER — SUCCINYLCHOLINE CHLORIDE 20 MG/ML IJ SOLN
INTRAMUSCULAR | Status: AC
  Filled 2015-03-26: qty 1

## 2015-03-26 MED ORDER — LACTATED RINGERS IV SOLN
INTRAVENOUS | Status: DC
  Administered 2015-03-26 (×2): via INTRAVENOUS

## 2015-03-26 MED ORDER — SUFENTANIL CITRATE 50 MCG/ML IV SOLN
INTRAVENOUS | Status: DC | PRN
  Administered 2015-03-26: 20 ug via INTRAVENOUS
  Administered 2015-03-26: 25 ug via INTRAVENOUS
  Administered 2015-03-26: 10 ug via INTRAVENOUS
  Administered 2015-03-26: 5 ug via INTRAVENOUS

## 2015-03-26 MED ORDER — MIDAZOLAM HCL 2 MG/2ML IJ SOLN
1.0000 mg | INTRAMUSCULAR | Status: DC | PRN
  Administered 2015-03-26: 2 mg via INTRAVENOUS

## 2015-03-26 MED ORDER — PROPOFOL 10 MG/ML IV BOLUS
INTRAVENOUS | Status: AC
  Filled 2015-03-26: qty 20

## 2015-03-26 MED ORDER — SCOPOLAMINE 1 MG/3DAYS TD PT72
MEDICATED_PATCH | TRANSDERMAL | Status: AC
  Filled 2015-03-26: qty 1

## 2015-03-26 MED ORDER — LIDOCAINE HCL (CARDIAC) 20 MG/ML IV SOLN
INTRAVENOUS | Status: AC
  Filled 2015-03-26: qty 5

## 2015-03-26 MED ORDER — EPHEDRINE SULFATE 50 MG/ML IJ SOLN
INTRAMUSCULAR | Status: AC
  Filled 2015-03-26: qty 1

## 2015-03-26 MED ORDER — CEFAZOLIN SODIUM-DEXTROSE 2-3 GM-% IV SOLR
2.0000 g | INTRAVENOUS | Status: AC
  Administered 2015-03-26: 2 g via INTRAVENOUS

## 2015-03-26 MED ORDER — CEFAZOLIN SODIUM 1-5 GM-% IV SOLN
INTRAVENOUS | Status: AC
  Filled 2015-03-26: qty 100

## 2015-03-26 MED ORDER — ATROPINE SULFATE 0.4 MG/ML IJ SOLN
INTRAMUSCULAR | Status: AC
  Filled 2015-03-26: qty 1

## 2015-03-26 MED ORDER — LIDOCAINE HCL 1 % IJ SOLN
INTRAVENOUS | Status: DC | PRN
  Administered 2015-03-26: 1000 mL

## 2015-03-26 MED ORDER — FENTANYL CITRATE (PF) 100 MCG/2ML IJ SOLN
50.0000 ug | INTRAMUSCULAR | Status: DC | PRN
  Administered 2015-03-26: 50 ug via INTRAVENOUS

## 2015-03-26 MED ORDER — DEXAMETHASONE SODIUM PHOSPHATE 10 MG/ML IJ SOLN
INTRAMUSCULAR | Status: AC
  Filled 2015-03-26: qty 1

## 2015-03-26 MED ORDER — FENTANYL CITRATE (PF) 100 MCG/2ML IJ SOLN
INTRAMUSCULAR | Status: AC
  Filled 2015-03-26: qty 2

## 2015-03-26 MED ORDER — DEXAMETHASONE SODIUM PHOSPHATE 4 MG/ML IJ SOLN
INTRAMUSCULAR | Status: DC | PRN
  Administered 2015-03-26: 10 mg via INTRAVENOUS
  Administered 2015-03-26: 4 mg via INTRAVENOUS

## 2015-03-26 MED ORDER — ONDANSETRON HCL 4 MG/2ML IJ SOLN: 4.0000 mg | Freq: Once | INTRAMUSCULAR | Status: DC | PRN

## 2015-03-26 MED ORDER — ACETAMINOPHEN 10 MG/ML IV SOLN
INTRAVENOUS | Status: DC | PRN
  Administered 2015-03-26: 1000 mg via INTRAVENOUS

## 2015-03-26 SURGICAL SUPPLY — 71 items
BAG DECANTER FOR FLEXI CONT (MISCELLANEOUS) ×2 IMPLANT
BINDER ABDOMINAL  9 SM 30-45 (SOFTGOODS)
BINDER ABDOMINAL 10 UNV 27-48 (MISCELLANEOUS) IMPLANT
BINDER ABDOMINAL 12 ML 46-62 (SOFTGOODS) IMPLANT
BINDER ABDOMINAL 9 SM 30-45 (SOFTGOODS) IMPLANT
BINDER BREAST LRG (GAUZE/BANDAGES/DRESSINGS) IMPLANT
BINDER BREAST MEDIUM (GAUZE/BANDAGES/DRESSINGS) IMPLANT
BINDER BREAST XLRG (GAUZE/BANDAGES/DRESSINGS) IMPLANT
BINDER BREAST XXLRG (GAUZE/BANDAGES/DRESSINGS) ×1 IMPLANT
BLADE HEX COATED 2.75 (ELECTRODE) ×2 IMPLANT
BLADE KNIFE PERSONA 10 (BLADE) ×4 IMPLANT
BLADE SURG 15 STRL LF DISP TIS (BLADE) ×1 IMPLANT
BLADE SURG 15 STRL SS (BLADE) ×2
BNDG GAUZE ELAST 4 BULKY (GAUZE/BANDAGES/DRESSINGS) ×4 IMPLANT
CANISTER SUCT 1200ML W/VALVE (MISCELLANEOUS) ×2 IMPLANT
CHLORAPREP W/TINT 26ML (MISCELLANEOUS) ×2 IMPLANT
COVER BACK TABLE 60X90IN (DRAPES) ×2 IMPLANT
COVER MAYO STAND STRL (DRAPES) ×2 IMPLANT
DECANTER SPIKE VIAL GLASS SM (MISCELLANEOUS) IMPLANT
DRAIN CHANNEL 19F RND (DRAIN) IMPLANT
DRAPE LAPAROSCOPIC ABDOMINAL (DRAPES) ×2 IMPLANT
DRSG PAD ABDOMINAL 8X10 ST (GAUZE/BANDAGES/DRESSINGS) ×4 IMPLANT
ELECT BLADE 4.0 EZ CLEAN MEGAD (MISCELLANEOUS)
ELECT REM PT RETURN 9FT ADLT (ELECTROSURGICAL) ×2
ELECTRODE BLDE 4.0 EZ CLN MEGD (MISCELLANEOUS) IMPLANT
ELECTRODE REM PT RTRN 9FT ADLT (ELECTROSURGICAL) ×1 IMPLANT
EVACUATOR SILICONE 100CC (DRAIN) IMPLANT
FILTER LIPOSUCTION (MISCELLANEOUS) ×2 IMPLANT
GLOVE BIO SURGEON STRL SZ 6.5 (GLOVE) ×8 IMPLANT
GLOVE BIOGEL M STRL SZ7.5 (GLOVE) ×1 IMPLANT
GLOVE BIOGEL PI IND STRL 8 (GLOVE) IMPLANT
GLOVE BIOGEL PI INDICATOR 8 (GLOVE) ×1
GOWN STRL REUS W/ TWL LRG LVL3 (GOWN DISPOSABLE) ×2 IMPLANT
GOWN STRL REUS W/ TWL XL LVL3 (GOWN DISPOSABLE) IMPLANT
GOWN STRL REUS W/TWL LRG LVL3 (GOWN DISPOSABLE) ×4
GOWN STRL REUS W/TWL XL LVL3 (GOWN DISPOSABLE) ×2
LINER CANISTER 1000CC FLEX (MISCELLANEOUS) ×2 IMPLANT
LIQUID BAND (GAUZE/BANDAGES/DRESSINGS) ×4 IMPLANT
NDL HYPO 25X1 1.5 SAFETY (NEEDLE) ×1 IMPLANT
NDL SAFETY ECLIPSE 18X1.5 (NEEDLE) ×1 IMPLANT
NEEDLE HYPO 18GX1.5 SHARP (NEEDLE) ×2
NEEDLE HYPO 25X1 1.5 SAFETY (NEEDLE) ×2 IMPLANT
NS IRRIG 1000ML POUR BTL (IV SOLUTION) ×2 IMPLANT
PACK BASIN DAY SURGERY FS (CUSTOM PROCEDURE TRAY) ×2 IMPLANT
PAD ALCOHOL SWAB (MISCELLANEOUS) ×2 IMPLANT
PENCIL BUTTON HOLSTER BLD 10FT (ELECTRODE) ×2 IMPLANT
SLEEVE SCD COMPRESS KNEE MED (MISCELLANEOUS) ×2 IMPLANT
SPONGE LAP 18X18 X RAY DECT (DISPOSABLE) ×4 IMPLANT
STRIP SUTURE WOUND CLOSURE 1/2 (SUTURE) ×4 IMPLANT
SUT MNCRL AB 4-0 PS2 18 (SUTURE) ×4 IMPLANT
SUT MON AB 3-0 SH 27 (SUTURE) ×16
SUT MON AB 3-0 SH27 (SUTURE) ×1 IMPLANT
SUT MON AB 5-0 PS2 18 (SUTURE) ×6 IMPLANT
SUT PDS 3-0 CT2 (SUTURE)
SUT PDS AB 2-0 CT2 27 (SUTURE) IMPLANT
SUT PDS II 3-0 CT2 27 ABS (SUTURE) IMPLANT
SUT SILK 3 0 PS 1 (SUTURE) IMPLANT
SUT VIC AB 3-0 SH 27 (SUTURE)
SUT VIC AB 3-0 SH 27X BRD (SUTURE) IMPLANT
SUT VICRYL 4-0 PS2 18IN ABS (SUTURE) IMPLANT
SYR 3ML 23GX1 SAFETY (SYRINGE) ×2 IMPLANT
SYR 50ML LL SCALE MARK (SYRINGE) ×2 IMPLANT
SYR BULB IRRIGATION 50ML (SYRINGE) ×2 IMPLANT
SYR CONTROL 10ML LL (SYRINGE) ×2 IMPLANT
SYR TB 1ML LL NO SAFETY (SYRINGE) ×2 IMPLANT
TOWEL OR 17X24 6PK STRL BLUE (TOWEL DISPOSABLE) ×4 IMPLANT
TUBE CONNECTING 20X1/4 (TUBING) ×2 IMPLANT
TUBING INFILTRATION IT-10001 (TUBING) IMPLANT
TUBING SET GRADUATE ASPIR 12FT (MISCELLANEOUS) ×2 IMPLANT
UNDERPAD 30X30 (UNDERPADS AND DIAPERS) ×4 IMPLANT
YANKAUER SUCT BULB TIP NO VENT (SUCTIONS) ×2 IMPLANT

## 2015-03-26 NOTE — Anesthesia Procedure Notes (Signed)
Procedure Name: Intubation Date/Time: 03/26/2015 8:54 AM Performed by: Melynda Ripple D Pre-anesthesia Checklist: Patient identified, Emergency Drugs available, Suction available and Patient being monitored Patient Re-evaluated:Patient Re-evaluated prior to inductionOxygen Delivery Method: Circle System Utilized Preoxygenation: Pre-oxygenation with 100% oxygen Intubation Type: IV induction Ventilation: Mask ventilation without difficulty Laryngoscope Size: Mac and 3 Grade View: Grade II Tube type: Oral Tube size: 7.0 mm Number of attempts: 1 Airway Equipment and Method: Stylet and Oral airway Placement Confirmation: ETT inserted through vocal cords under direct vision,  positive ETCO2 and breath sounds checked- equal and bilateral Secured at: 23 cm Tube secured with: Tape Dental Injury: Teeth and Oropharynx as per pre-operative assessment

## 2015-03-26 NOTE — Transfer of Care (Signed)
Immediate Anesthesia Transfer of Care Note  Patient: [name]  Procedure(s) Performed: Procedure(s): BILATERAL BREAST REDUCTION  (Bilateral) WITH LIPOSUCTION (Bilateral)  Patient Location: PACU  Anesthesia Type:General  Level of Consciousness: awake, alert  and oriented  Airway & Oxygen Therapy: Patient Spontanous Breathing and Patient connected to face mask oxygen  Post-op Assessment: Report given to RN and Post -op Vital signs reviewed and stable  Post vital signs: Reviewed and stable  Last Vitals:  Filed Vitals:   03/26/15 0738  BP: 124/69  Pulse: 73  Temp: 36.6 C  Resp: 18    Complications: No apparent anesthesia complications

## 2015-03-26 NOTE — Brief Op Note (Signed)
03/26/2015  12:17 PM  PATIENT:  [name]  44 y.o. female  PRE-OPERATIVE DIAGNOSIS:  MAMMARY HYPERTROPHY UPPER AND LOWER BACK, NECK, SHOULDER STRAP GROOVING BREAST PAIN   POST-OPERATIVE DIAGNOSIS:  MAMMARY HYPERTROPHY UPPER AND LOWER BACK  PROCEDURE:  Procedure(s): BILATERAL BREAST REDUCTION  (Bilateral) WITH LIPOSUCTION (Bilateral)  SURGEON:  Surgeon(s) and Role:    * Kaitlan Bin S Maye Parkinson, DO - Primary  PHYSICIAN ASSISTANT: [name] Rayburn, PA  ASSISTANTS: none   ANESTHESIA:   general  EBL:  Total I/O In: 2000 [I.V.:2000] Out: -   BLOOD ADMINISTERED:none  DRAINS: none   LOCAL MEDICATIONS USED:  LIDOCAINE   SPECIMEN:  Source of Specimen:  bilateral breast tissue  DISPOSITION OF SPECIMEN:  PATHOLOGY  COUNTS:  YES  TOURNIQUET:  * No tourniquets in log *  DICTATION: .Dragon Dictation  PLAN OF CARE: Discharge to home after PACU  PATIENT DISPOSITION:  PACU - hemodynamically stable.   Delay start of Pharmacological VTE agent (>24hrs) due to surgical blood loss or risk of bleeding: no

## 2015-03-26 NOTE — Op Note (Signed)
Breast Reduction Op note:    DATE OF PROCEDURE: 03/26/2015  LOCATION: [place]  SURGEON: Lyndee Leo Sanger  ASSISTANT: [name] Rayburn, PA  PREOPERATIVE DIAGNOSIS 1. Macromastia 2. Neck Pain 3. Back Pain  POSTOPERATIVE DIAGNOSIS 1. Macromastia 2. Neck Pain 3. Back Pain  PROCEDURES 1. Bilateral breast reduction.  Right reduction 1237 gm, Left reduction 123XX123 gm  COMPLICATIONS: None.  DRAINS: none  INDICATIONS FOR PROCEDURE The patient, @FNAMEA @ [place], is a 44 y.o. year-old female with a history of symptomatic macromastia with concominant back pain, neck pain, shoulder grooving from her bra.   MRN: [id number]  CONSENT Informed consent was obtained directly from the patient. The risks, benefits and alternatives were fully discussed. Specific risks including but not limited to bleeding, infection, hematoma, seroma, scarring, pain, nipple necrosis, asymmetry, poor cosmetic results, and need for further surgery were discussed. The patient had ample opportunity to have her questions answered to her satisfaction.  DESCRIPTION OF PROCEDURE  Patient was brought into the operating room and placed in a supine position.  SCDs were placed and appropriate padding was performed.  Antibiotics were given. The patient underwent general anesthesia and the chest was prepped and draped in a sterile fashion.  A timeout was performed and all information was confirmed to be correct.  Right side: Preoperative markings were confirmed.  Tumescent was placed at the start of the case in the medial inferior and lateral breast area of each breast. Liposuction was done at the start. Incision lines were injected with 1% Xylocaine with epinephrine.  After waiting for vasoconstriction, the marked lines were incised.  A Wise-pattern superomedial breast reduction was performed by de-epithelializing the pedicle, using bovie to create the superomedial pedicle, and removing breast tissue from the  superior, lateral, and inferior portions of the breast.  Care was taken to not undermine the breast pedicle. Hemostasis was achieved.  The nipple was gently rotated into position and the soft tissue closed with 4-0 Monocryl.   The pocket was irrigated and hemostasis confirmed.  The deep tissues were approximated with 3-0 monocryl sutures and the skin was closed with deep dermal and subcuticular 4-0 Monocryl sutures.  The nipple and skin flaps had good capillary refill at the end of the procedure.    Left side: Preoperative markings were confirmed.  Incision lines were injected with 1% Xylocaine with epinephrine.  Tumescent was placed and liposuction was done at the start.  After waiting for vasoconstriction, the marked lines were incised.  A Wise-pattern superomedial breast reduction was performed by de-epithelializing the pedicle, using bovie to create the superomedial pedicle, and removing breast tissue from the superior, lateral, and inferior portions of the breast.  Care was taken to not undermine the breast pedicle. Hemostasis was achieved.  The nipple was gently rotated into position and the soft tissue was closed with 4-0 Monocryl.  The patient was sat upright and size and shape symmetry was confirmed.  The pocket was irrigated and hemostasis confirmed.  The deep tissues were approximated with 3-0 monocryl sutures and the skin was closed with deep dermal and subcuticular 4-0 Monocryl sutures.  Dermabond was applied.  A breast binder and ABDs were placed.  The nipple and skin flaps had good capillary refill at the end of the procedure.  The patient tolerated the procedure well. The patient was allowed to wake from anesthesia and taken to the recovery room in satisfactory condition

## 2015-03-26 NOTE — H&P (Signed)
[name] is an 44 y.o. female.   Chief Complaint: mammary hypertrophy HPI:  The patient is a 44 y.o. female here for pre operative history and physical for bilateral breast reduction surgery.  She has extremely large breasts causing symptoms that include the following: Back pain (upper and lower) and neck pain. She frequently pins bra cups higher on straps for better lift and relief. Notices relief when holding breast up in her hands. Shoulder straps causing grooves, pain occasional skin erosion requiring padding. Pain medication is sometimes required with motrin and tylenol. She has serious pain issue in her neck and back for which she has been to a pain specialist and is being worked up for neurosurgery. The sternal to nipple distance on the right is 33 and the left is 33. She is 5 feet 8 inches tall and weighs 240 pounds. Preoperative bra size = 42DDD cup. The estimated excess breast tissue to be removed at the time of surgery is 895 grams on the left and 895 grams on the right. She would like to be around a C/D cup. 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   Past Medical History  Diagnosis Date  . Hypertension     a  . Anxiety   . Depression     during divorce & legal matters  . Osteoarthritis   . GERD (gastroesophageal reflux disease)   . IBS (irritable bowel syndrome)   . Lupus (HCC) hx positive ANA    followed by Dr Gavin Pound; possible lupus  . History of polycystic ovarian disease S/P BSO  . 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 Chronic joint pain   . Myalgia   . History of kidney stones     Dr Phebe Colla  . Hx of anxiety disorder   . Hypothyroidism     no meds  . PONV (postoperative nausea and vomiting)   . Symptomatic mammary hypertrophy   . URI (upper respiratory infection)     currently on cefdinir    Past Surgical History  Procedure Laterality Date  . Eye  surgery      Retinal tears surgery bilateral  . Tonsillectomy    . Carpal tunnel release      bilateral; Dr Sherwood Gambler  . Plantar fascia surgery      right  . Plantar fascia surgery  02/2011    left  . Cystoscopy w/ ureteral stent placement  11/12/2011    Procedure: CYSTOSCOPY WITH RETROGRADE PYELOGRAM/URETERAL STENT PLACEMENT;  Surgeon: Alexis Frock, MD;  Location: [facility];  Service: Urology;  Laterality: Right;  . Laparoscopic cholecystectomy  05-31-2007  . Cysto/ right retrograde ureteral pyelogram  07-09-2004    HX BILATERAL RENAL STONES/ RIGHT FLANK PAIN  . Excision left bartholin gland  02-05-2002  . Laparoscopy with right salpingo-oophectomy/ lysis adhesions and ablation endometriosis  05-17-2001  . Right ureteroscopic stone extraction  08-04-2000  . Laparoscopic laser ablation endometriosis and left salpingo-oophorectomy  11-03-1999  . Laparoscopic assisted vaginal hysterectomy  2000  . Dilation and curettage of uterus      X5  . Ankle surgery      X3  . Appendectomy  1991    exploratory lap  . Lumbar laminectomy  2003    L4 - L5  . Ureteral stent placement  11/12/11    Dr Tresa Moore  . Cystoscopy w/ ureteral stent removal  12/07/2011  Procedure: CYSTOSCOPY WITH STENT REMOVAL;  Surgeon: Alexis Frock, MD;  Location: [facility];  Service: Urology;  Laterality: Right;  . Cystoscopy/retrograde/ureteroscopy/stone extraction with basket  12/07/2011    Procedure: CYSTOSCOPY/RETROGRADE/URETEROSCOPY/STONE EXTRACTION WITH BASKET;  Surgeon: Alexis Frock, MD;  Location: [facility];  Service: Urology;  Laterality: Right;  . Colonoscopy      in 1990s    Family History  Problem Relation Age of Onset  . Hypertension Mother   . Colon polyps Mother   . Atrial fibrillation Mother     Dr Angelena Form  . Heart attack Father 7  . Diabetes Father   . Hypertension Sister   . Heart attack Paternal Grandmother 15  . Breast cancer Paternal Aunt   . Colon cancer  Maternal Uncle   . Colon cancer Paternal Uncle   . Colon polyps Maternal Grandmother   . Stroke Maternal Grandmother 76  . Diabetes Paternal Grandfather   . Stroke Maternal Grandfather 38   Social History:  reports that she has never smoked. She has never used smokeless tobacco. She reports that she drinks about 0.6 oz of alcohol per week. She reports that she does not use illicit drugs.  Allergies:  Allergies  Allergen Reactions  . Ivp Dye [Iodinated Diagnostic Agents] Rash and Other (See Comments)    Flushing, minor facial rash & dyspnea  . Nalbuphine Shortness Of Breath and Rash    Nubain caused respiratory distress & rash  . Septra [Sulfamethoxazole-Trimethoprim] Shortness Of Breath and Rash  . Sulfamethoxazole-Trimethoprim Rash    rash  . Tylox [Oxycodone-Acetaminophen] Rash    Medications Prior to Admission  Medication Sig Dispense Refill  . aspirin 81 MG tablet Take 81 mg by mouth daily.     . benzonatate (TESSALON PERLES) 100 MG capsule Take by mouth 3 (three) times daily as needed for cough.    . calcium-vitamin D (OSCAL WITH D) 500-200 MG-UNIT tablet Take 1 tablet by mouth.    . cefdinir (OMNICEF) 300 MG capsule Take 300 mg by mouth 2 (two) times daily.    . celecoxib (CELEBREX) 200 MG capsule Take 200 capsules by mouth 2 (two) times daily.     . cetirizine (ZYRTEC) 10 MG tablet Take 10 mg by mouth daily.    . Chlorpheniramine-DM (CORICIDIN HBP COUGH/COLD PO) Take by mouth.    . cholecalciferol (VITAMIN D) 1000 UNITS tablet Take 1,000 Units by mouth daily.    . citalopram (CELEXA) 20 MG tablet Take 1 tablet (20 mg total) by mouth daily. 90 tablet 0  . diazepam (VALIUM) 5 MG tablet Take 5 mg by mouth every 6 (six) hours as needed for anxiety.    . fish oil-omega-3 fatty acids 1000 MG capsule Take 1 g by mouth daily.    [name] gabapentin (NEURONTIN) 300 MG capsule Take 2 capsules (600 mg total) by mouth 3 (three) times daily. 180 capsule 11  . hydrochlorothiazide (MICROZIDE)  12.5 MG capsule Take 1 capsule (12.5 mg total) by mouth daily. 90 capsule 1  . HYDROcodone-acetaminophen (NORCO/VICODIN) 5-325 MG per tablet Take 1 tablet by mouth every 12 (twelve) hours as needed for moderate pain. 60 tablet 0  . hydroxychloroquine (PLAQUENIL) 200 MG tablet Take 200 mg by mouth 2 (two) times daily.     [name] lisinopril (PRINIVIL,ZESTRIL) 40 MG tablet TAKE 1 TABLET EVERY DAY 90 tablet 1  . Lysine 500 MG CAPS Take by mouth.    . Multiple Vitamin (MULTIVITAMIN WITH MINERALS) TABS tablet Take 1 tablet  by mouth daily.    [name] omeprazole (PRILOSEC) 40 MG capsule TAKE 1 CAPSULE EVERY MORNING 90 capsule 0  . tamsulosin (FLOMAX) 0.4 MG CAPS capsule Take 0.4 mg by mouth.    . Gabapentin, Once-Daily, 300 MG TABS Take 300 mg by mouth.    . hydrochlorothiazide (MICROZIDE) 12.5 MG capsule Take 12.5 mg by mouth.    . hydroxychloroquine (PLAQUENIL) 200 MG tablet Take by mouth.    . levothyroxine (SYNTHROID) 112 MCG tablet Take 112 mcg by mouth.    [name] lisinopril (PRINIVIL,ZESTRIL) 40 MG tablet Take 40 mg by mouth.      No results found for this or any previous visit (from the past 48 hour(s)). No results found.  Review of Systems  Constitutional: Negative.   HENT: Negative.   Eyes: Negative.   Respiratory: Negative.   Cardiovascular: Negative.   Gastrointestinal: Negative.   Genitourinary: Negative.   Musculoskeletal: Negative.   Skin: Negative.   Neurological: Negative.   Psychiatric/Behavioral: Negative.     Blood pressure 124/69, pulse 73, temperature 97.9 F (36.6 C), temperature source Oral, resp. rate 18, height 5\' 8"  (1.727 m), weight 112.209 kg (247 lb 6 oz), SpO2 100 %. Physical Exam  Constitutional: She is oriented to person, place, and time. [name].  HENT:  Head: Normocephalic and atraumatic.  Eyes: Conjunctivae and EOM are normal. Pupils are equal, round, and reactive to light.  Cardiovascular: Normal rate.   Respiratory: Effort  normal.  GI: Soft.  Neurological: She is alert and oriented to person, place, and time.  Skin: Skin is warm.  Psychiatric: She has a normal mood and affect. Her behavior is normal. Judgment and thought content normal.     Assessment/Plan Plan for bilateral breast reduction.  Wallace Going 03/26/2015, 7:42 AM

## 2015-03-26 NOTE — Discharge Instructions (Signed)
May shower Saturday Continue binder or sports bra No heavy lifting   Post Anesthesia Home Care Instructions  Activity: Get plenty of rest for the remainder of the day. A responsible adult should stay with you for 24 hours following the procedure.  For the next 24 hours, DO NOT: -Drive a car -Paediatric nurse -Drink alcoholic beverages -Take any medication unless instructed by your physician -Make any legal decisions or sign important papers.  Meals: Start with liquid foods such as gelatin or soup. Progress to regular foods as tolerated. Avoid greasy, spicy, heavy foods. If nausea and/or vomiting occur, drink only clear liquids until the nausea and/or vomiting subsides. Call your physician if vomiting continues.  Special Instructions/Symptoms: Your throat may feel dry or sore from the anesthesia or the breathing tube placed in your throat during surgery. If this causes discomfort, gargle with warm salt water. The discomfort should disappear within 24 hours.  If you had a scopolamine patch placed behind your ear for the management of post- operative nausea and/or vomiting:  1. The medication in the patch is effective for 72 hours, after which it should be removed.  Wrap patch in a tissue and discard in the trash. Wash hands thoroughly with soap and water. 2. You may remove the patch earlier than 72 hours if you experience unpleasant side effects which may include dry mouth, dizziness or visual disturbances. 3. Avoid touching the patch. Wash your hands with soap and water after contact with the patch.

## 2015-03-26 NOTE — Anesthesia Preprocedure Evaluation (Signed)
Anesthesia Evaluation  Patient identified by MRN, date of birth, ID band Patient awake    Reviewed: Allergy & Precautions, NPO status , Patient's Chart, lab work & pertinent test results  History of Anesthesia Complications (+) PONV and history of anesthetic complications  Airway Mallampati: II  TM Distance: >3 FB Neck ROM: Full    Dental no notable dental hx. (+) Dental Advisory Given   Pulmonary neg pulmonary ROS,    Pulmonary exam normal breath sounds clear to auscultation       Cardiovascular hypertension, Pt. on medications Normal cardiovascular exam Rhythm:Regular Rate:Normal     Neuro/Psych PSYCHIATRIC DISORDERS Anxiety Depression negative neurological ROS     GI/Hepatic Neg liver ROS, GERD  Medicated and Controlled,  Endo/Other  Hypothyroidism   Renal/GU negative Renal ROS  negative genitourinary   Musculoskeletal  (+) Arthritis , Fibromyalgia -  Abdominal   Peds negative pediatric ROS (+)  Hematology negative hematology ROS (+)   Anesthesia Other Findings   Reproductive/Obstetrics negative OB ROS                             Anesthesia Physical Anesthesia Plan  ASA: II  Anesthesia Plan: General   Post-op Pain Management:    Induction: Intravenous  Airway Management Planned: LMA  Additional Equipment:   Intra-op Plan:   Post-operative Plan: Extubation in OR  Informed Consent: I have reviewed the patients History and Physical, chart, labs and discussed the procedure including the risks, benefits and alternatives for the proposed anesthesia with the patient or authorized representative who has indicated his/her understanding and acceptance.   Dental advisory given  Plan Discussed with: CRNA  Anesthesia Plan Comments:         Anesthesia Quick Evaluation

## 2015-03-26 NOTE — Anesthesia Postprocedure Evaluation (Signed)
Anesthesia Post Note  Patient: [name]  Procedure(s) Performed: Procedure(s) (LRB): BILATERAL BREAST REDUCTION  (Bilateral) WITH LIPOSUCTION (Bilateral)  Patient location during evaluation: PACU Anesthesia Type: General Level of consciousness: awake and alert Pain management: pain level controlled Vital Signs Assessment: post-procedure vital signs reviewed and stable Respiratory status: spontaneous breathing, nonlabored ventilation, respiratory function stable and patient connected to nasal cannula oxygen Cardiovascular status: blood pressure returned to baseline and stable Postop Assessment: no signs of nausea or vomiting Anesthetic complications: no    Last Vitals:  Filed Vitals:   03/26/15 1245 03/26/15 1330  BP: 113/66 144/73  Pulse: 103 97  Temp:  36.3 C  Resp: 21 20    Last Pain:  Filed Vitals:   03/26/15 1341  PainSc: 0-No pain                 Autym Siess JENNETTE

## 2015-03-27 ENCOUNTER — Encounter (HOSPITAL_BASED_OUTPATIENT_CLINIC_OR_DEPARTMENT_OTHER): Payer: Self-pay | Admitting: Plastic Surgery

## 2015-04-01 DIAGNOSIS — Z9889 Other specified postprocedural states: Secondary | ICD-10-CM | POA: Insufficient documentation

## 2015-04-05 HISTORY — PX: NECK SURGERY: SHX720

## 2015-04-07 DIAGNOSIS — Z9013 Acquired absence of bilateral breasts and nipples: Secondary | ICD-10-CM | POA: Diagnosis not present

## 2015-04-16 ENCOUNTER — Telehealth: Payer: Self-pay | Admitting: Internal Medicine

## 2015-04-16 MED ORDER — HYDROCHLOROTHIAZIDE 12.5 MG PO CAPS
12.5000 mg | ORAL_CAPSULE | Freq: Every day | ORAL | Status: DC
Start: 2015-04-16 — End: 2015-12-17

## 2015-04-16 MED ORDER — LISINOPRIL 40 MG PO TABS: ORAL_TABLET | ORAL | Status: DC

## 2015-04-16 NOTE — Telephone Encounter (Signed)
Patient is transfer over to [facility] in March.  She is switching pharmacies and needs scripts for lisinopril and htcz to be sent to [place] mail order.  Can be faxed to [phone].  Ph [phone].

## 2015-04-17 ENCOUNTER — Encounter (HOSPITAL_COMMUNITY): Payer: Self-pay | Admitting: Nurse Practitioner

## 2015-04-17 ENCOUNTER — Emergency Department (HOSPITAL_COMMUNITY)
Admission: EM | Admit: 2015-04-17 | Discharge: 2015-04-17 | Disposition: A | Discharge status: Home / Self Care | Payer: PPO | Attending: Emergency Medicine | Admitting: Emergency Medicine

## 2015-04-17 DIAGNOSIS — F419 Anxiety disorder, unspecified: Secondary | ICD-10-CM | POA: Insufficient documentation

## 2015-04-17 DIAGNOSIS — I10 Essential (primary) hypertension: Secondary | ICD-10-CM | POA: Insufficient documentation

## 2015-04-17 DIAGNOSIS — Z87442 Personal history of urinary calculi: Secondary | ICD-10-CM | POA: Diagnosis not present

## 2015-04-17 DIAGNOSIS — E039 Hypothyroidism, unspecified: Secondary | ICD-10-CM | POA: Insufficient documentation

## 2015-04-17 DIAGNOSIS — Z79899 Other long term (current) drug therapy: Secondary | ICD-10-CM | POA: Insufficient documentation

## 2015-04-17 DIAGNOSIS — Z9889 Other specified postprocedural states: Secondary | ICD-10-CM | POA: Diagnosis not present

## 2015-04-17 DIAGNOSIS — Z7982 Long term (current) use of aspirin: Secondary | ICD-10-CM | POA: Insufficient documentation

## 2015-04-17 DIAGNOSIS — K219 Gastro-esophageal reflux disease without esophagitis: Secondary | ICD-10-CM | POA: Insufficient documentation

## 2015-04-17 DIAGNOSIS — Z8742 Personal history of other diseases of the female genital tract: Secondary | ICD-10-CM | POA: Diagnosis not present

## 2015-04-17 DIAGNOSIS — M1990 Unspecified osteoarthritis, unspecified site: Secondary | ICD-10-CM | POA: Insufficient documentation

## 2015-04-17 DIAGNOSIS — Z791 Long term (current) use of non-steroidal anti-inflammatories (NSAID): Secondary | ICD-10-CM | POA: Insufficient documentation

## 2015-04-17 DIAGNOSIS — J069 Acute upper respiratory infection, unspecified: Secondary | ICD-10-CM | POA: Insufficient documentation

## 2015-04-17 DIAGNOSIS — Z4889 Encounter for other specified surgical aftercare: Secondary | ICD-10-CM

## 2015-04-17 DIAGNOSIS — Z792 Long term (current) use of antibiotics: Secondary | ICD-10-CM | POA: Insufficient documentation

## 2015-04-17 DIAGNOSIS — F329 Major depressive disorder, single episode, unspecified: Secondary | ICD-10-CM | POA: Insufficient documentation

## 2015-04-17 DIAGNOSIS — G8929 Other chronic pain: Secondary | ICD-10-CM | POA: Diagnosis not present

## 2015-04-17 DIAGNOSIS — Z4801 Encounter for change or removal of surgical wound dressing: Secondary | ICD-10-CM | POA: Insufficient documentation

## 2015-04-17 LAB — CBC WITH DIFFERENTIAL/PLATELET
Basophils Absolute: 0 10*3/uL (ref 0.0–0.1)
Basophils Relative: 0 %
EOS ABS: 0 10*3/uL (ref 0.0–0.7)
EOS PCT: 0 %
HCT: 40 % (ref 36.0–46.0)
Hemoglobin: 13.6 g/dL (ref 12.0–15.0)
LYMPHS ABS: 3.6 10*3/uL (ref 0.7–4.0)
LYMPHS PCT: 39 %
MCH: 32.3 pg (ref 26.0–34.0)
MCHC: 34 g/dL (ref 30.0–36.0)
MCV: 95 fL (ref 78.0–100.0)
MONO ABS: 0.9 10*3/uL (ref 0.1–1.0)
MONOS PCT: 10 %
Neutro Abs: 4.7 10*3/uL (ref 1.7–7.7)
Neutrophils Relative %: 51 %
PLATELETS: 146 10*3/uL — AB (ref 150–400)
RBC: 4.21 MIL/uL (ref 3.87–5.11)
RDW: 14.2 % (ref 11.5–15.5)
WBC: 9.2 10*3/uL (ref 4.0–10.5)

## 2015-04-17 LAB — I-STAT CHEM 8, ED
BUN: 19 mg/dL (ref 6–20)
CALCIUM ION: 1.19 mmol/L (ref 1.12–1.23)
CHLORIDE: 107 mmol/L (ref 101–111)
Creatinine, Ser: 0.7 mg/dL (ref 0.44–1.00)
GLUCOSE: 90 mg/dL (ref 65–99)
HCT: 34 % — ABNORMAL LOW (ref 36.0–46.0)
Hemoglobin: 11.6 g/dL — ABNORMAL LOW (ref 12.0–15.0)
Potassium: 3.7 mmol/L (ref 3.5–5.1)
SODIUM: 144 mmol/L (ref 135–145)
TCO2: 23 mmol/L (ref 0–100)

## 2015-04-17 LAB — BASIC METABOLIC PANEL
Anion gap: 20 — ABNORMAL HIGH (ref 5–15)
BUN: 11 mg/dL (ref 6–20)
CO2: 22 mmol/L (ref 22–32)
CREATININE: 2.39 mg/dL — AB (ref 0.44–1.00)
Calcium: 9.8 mg/dL (ref 8.9–10.3)
Chloride: 92 mmol/L — ABNORMAL LOW (ref 101–111)
GFR calc Af Amer: 27 mL/min — ABNORMAL LOW (ref >60)
GFR, EST NON AFRICAN AMERICAN: 23 mL/min — AB (ref >60)
GLUCOSE: 138 mg/dL — AB (ref 65–99)
Potassium: 2.9 mmol/L — ABNORMAL LOW (ref 3.5–5.1)
SODIUM: 134 mmol/L — AB (ref 135–145)

## 2015-04-17 MED ORDER — SODIUM CHLORIDE 0.9 % IV BOLUS (SEPSIS)
1000.0000 mL | Freq: Once | INTRAVENOUS | Status: AC
  Administered 2015-04-17: 1000 mL via INTRAVENOUS

## 2015-04-17 NOTE — Discharge Instructions (Signed)
You were seen in the ER today for evaluation of bleeding/drainage from your surgical site. I spoke to the on-call surgeon at Dr. Eusebio Friendly office. What you are experiencing is normal. We packed the area with a long gauze strip. This will need to be removed in clinic when you see Dr. Marla Roe on Tuesday. Apply pressure as needed. Return to the ER for new or worsening symptoms.

## 2015-04-17 NOTE — ED Notes (Signed)
She had breast reduction on December 22, states she was healing well. Has been following up with surgery as scheduled. Today she began bleeding from incision site in L breast, has soaked through multiple towels, bleeding oozing in triage when pt lifts and places pressure on breast but not otherwise. She states she did notice the breast was tender today.

## 2015-04-17 NOTE — ED Provider Notes (Signed)
CSN: [id number]     Arrival date & time 04/17/15  1702 History  By signing my name below, I, Rayna Sexton, attest that this documentation has been prepared under the direction and in the presence of Hena Ewalt Y Cana Mignano, [place]. Electronically Signed: Rayna Sexton, ED Scribe. 04/17/2015. 5:40 PM.   Chief Complaint  Patient presents with  . Post-op Problem   The history is provided by the patient. No language interpreter was used.    HPI Comments: [name] is a 45 y.o. female who had a breast reduction surgery performed on 03/26/2015 by Dr. Marla Roe at [facility] presents to the Emergency Department due to mild bleeding from the incision site of her left breast which began at 3:00 PM. States she got up from her chair and noticed her shirt was wet. Pt notes having soaked through multiple towels today when applying pressure to the site and worsening of her symptoms when moving the affected breast. She notes associated, mild, itchiness in the affected region and mild fatigue. SStates when she lays back the bleeding stops but when she sits up the bleeding starts again. She confirms having been following up with her surgeon as scheduled with her last appointment 1 week ago with no abnormalities. She notes having a follow up appointment on 1/17. Pt denies any recent trauma to the affected region. She denies increased redness of the region, fevers, chills, dizziness, lightheadedness or any other associated symptoms at this time.    Past Medical History  Diagnosis Date  . Hypertension     a  . Anxiety   . Depression     during divorce & legal matters  . Osteoarthritis   . GERD (gastroesophageal reflux disease)   . IBS (irritable bowel syndrome)   . Lupus (HCC) hx positive ANA    followed by Dr Gavin Pound; possible lupus  . History of polycystic ovarian disease S/P BSO  . 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 Chronic joint pain   . Myalgia   . History of kidney stones      Dr Phebe Colla  . Hx of anxiety disorder   . Hypothyroidism     no meds  . PONV (postoperative nausea and vomiting)   . Symptomatic mammary hypertrophy   . URI (upper respiratory infection)     currently on cefdinir   Past Surgical History  Procedure Laterality Date  . Eye surgery      Retinal tears surgery bilateral  . Tonsillectomy    . Carpal tunnel release      bilateral; Dr Sherwood Gambler  . Plantar fascia surgery      right  . Plantar fascia surgery  02/2011    left  . Cystoscopy w/ ureteral stent placement  11/12/2011    Procedure: CYSTOSCOPY WITH RETROGRADE PYELOGRAM/URETERAL STENT PLACEMENT;  Surgeon: Alexis Frock, MD;  Location: [facility];  Service: Urology;  Laterality: Right;  . Laparoscopic cholecystectomy  05-31-2007  . Cysto/ right retrograde ureteral pyelogram  07-09-2004    HX BILATERAL RENAL STONES/ RIGHT FLANK PAIN  . Excision left bartholin gland  02-05-2002  . Laparoscopy with right salpingo-oophectomy/ lysis adhesions and ablation endometriosis  05-17-2001  . Right ureteroscopic stone extraction  08-04-2000  . Laparoscopic laser ablation endometriosis and left salpingo-oophorectomy  11-03-1999  . Laparoscopic assisted vaginal hysterectomy  2000  . Dilation and curettage of uterus      X5  . Ankle surgery      X3  . Appendectomy  1991  exploratory lap  . Lumbar laminectomy  2003    L4 - L5  . Ureteral stent placement  11/12/11    Dr Tresa Moore  . Cystoscopy w/ ureteral stent removal  12/07/2011    Procedure: CYSTOSCOPY WITH STENT REMOVAL;  Surgeon: Alexis Frock, MD;  Location: [facility];  Service: Urology;  Laterality: Right;  . Cystoscopy/retrograde/ureteroscopy/stone extraction with basket  12/07/2011    Procedure: CYSTOSCOPY/RETROGRADE/URETEROSCOPY/STONE EXTRACTION WITH BASKET;  Surgeon: Alexis Frock, MD;  Location: [facility];  Service: Urology;  Laterality: Right;  . Colonoscopy      in 1990s  . Breast reduction surgery  Bilateral 03/26/2015    Procedure: BILATERAL BREAST REDUCTION ;  Surgeon: Wallace Going, DO;  Location: [place];  Service: Plastics;  Laterality: Bilateral;  . Liposuction Bilateral 03/26/2015    Procedure: WITH LIPOSUCTION;  Surgeon: Wallace Going, DO;  Location: [place];  Service: Plastics;  Laterality: Bilateral;   Family History  Problem Relation Age of Onset  . Hypertension Mother   . Colon polyps Mother   . Atrial fibrillation Mother     Dr Angelena Form  . Heart attack Father 90  . Diabetes Father   . Hypertension Sister   . Heart attack Paternal Grandmother 13  . Breast cancer Paternal Aunt   . Colon cancer Maternal Uncle   . Colon cancer Paternal Uncle   . Colon polyps Maternal Grandmother   . Stroke Maternal Grandmother 76  . Diabetes Paternal Grandfather   . Stroke Maternal Grandfather 41   Social History  Substance Use Topics  . Smoking status: Never Smoker   . Smokeless tobacco: Never Used  . Alcohol Use: 0.6 oz/week    1 Shots of liquor per week     Comment:  very rarely   OB History    No data available     Review of Systems  All other systems reviewed and are negative.   Allergies  Ivp dye; Nalbuphine; Septra; Sulfamethoxazole-trimethoprim; Tylox; and Chloraprep one step  Home Medications   Prior to Admission medications   Medication Sig Start Date End Date Taking? Authorizing Provider  aspirin 81 MG tablet Take 81 mg by mouth daily.     Historical Provider, MD  benzonatate (TESSALON PERLES) 100 MG capsule Take by mouth 3 (three) times daily as needed for cough.    Historical Provider, MD  calcium-vitamin D (OSCAL WITH D) 500-200 MG-UNIT tablet Take 1 tablet by mouth.    Historical Provider, MD  cefdinir (OMNICEF) 300 MG capsule Take 300 mg by mouth 2 (two) times daily.    Historical Provider, MD  celecoxib (CELEBREX) 200 MG capsule Take 200 capsules by mouth 2 (two) times daily.  12/10/13   Historical  Provider, MD  cetirizine (ZYRTEC) 10 MG tablet Take 10 mg by mouth daily.    Historical Provider, MD  Chlorpheniramine-DM (CORICIDIN HBP COUGH/COLD PO) Take by mouth.    Historical Provider, MD  cholecalciferol (VITAMIN D) 1000 UNITS tablet Take 1,000 Units by mouth daily.    Historical Provider, MD  citalopram (CELEXA) 20 MG tablet Take 1 tablet (20 mg total) by mouth daily. 01/22/15   Hendricks Limes, MD  diazepam (VALIUM) 5 MG tablet Take 5 mg by mouth every 6 (six) hours as needed for anxiety.    Historical Provider, MD  fish oil-omega-3 fatty acids 1000 MG capsule Take 1 g by mouth daily.    Historical Provider, MD  gabapentin (NEURONTIN) 300 MG  capsule Take 2 capsules (600 mg total) by mouth 3 (three) times daily. 02/20/14   Marcial Pacas, MD  Gabapentin, Once-Daily, 300 MG TABS Take 300 mg by mouth.    Historical Provider, MD  hydrochlorothiazide (MICROZIDE) 12.5 MG capsule Take 12.5 mg by mouth.    Historical Provider, MD  hydrochlorothiazide (MICROZIDE) 12.5 MG capsule Take 1 capsule (12.5 mg total) by mouth daily. 04/16/15   Binnie Rail, MD  HYDROcodone-acetaminophen (NORCO/VICODIN) 5-325 MG per tablet Take 1 tablet by mouth every 12 (twelve) hours as needed for moderate pain. 02/20/14   Marcial Pacas, MD  hydroxychloroquine (PLAQUENIL) 200 MG tablet Take 200 mg by mouth 2 (two) times daily.     Historical Provider, MD  hydroxychloroquine (PLAQUENIL) 200 MG tablet Take by mouth.    Historical Provider, MD  levothyroxine (SYNTHROID) 112 MCG tablet Take 112 mcg by mouth.    Historical Provider, MD  lisinopril (PRINIVIL,ZESTRIL) 40 MG tablet Take 40 mg by mouth.    Historical Provider, MD  lisinopril (PRINIVIL,ZESTRIL) 40 MG tablet TAKE 1 TABLET EVERY DAY 04/16/15   Binnie Rail, MD  Lysine 500 MG CAPS Take by mouth.    Historical Provider, MD  Multiple Vitamin (MULTIVITAMIN WITH MINERALS) TABS tablet Take 1 tablet by mouth daily.    Historical Provider, MD  omeprazole (PRILOSEC) 40 MG capsule  TAKE 1 CAPSULE EVERY MORNING    Ladene Artist, MD  tamsulosin (FLOMAX) 0.4 MG CAPS capsule Take 0.4 mg by mouth. 11/08/11   Historical Provider, MD   BP 127/70 mmHg  Pulse 78  Resp 14  SpO2 100%    Physical Exam  Constitutional: She is oriented to person, place, and time. [name].  HENT:  Head: Normocephalic and atraumatic.  Neck: Normal range of motion.  Cardiovascular: Normal rate.   Pulmonary/Chest: Effort normal. No respiratory distress.  Abdominal: Soft.  Musculoskeletal: Normal range of motion.  Neurological: She is alert and oriented to person, place, and time.  Skin: Skin is warm and dry. She is not diaphoretic.  Bilateral well-healing breast incisions from breast reduction surgery. Left incision site has pinhole sized opening inferior to nipple where there is serosanguinous drainage when pt is upright. No purulent discharge. THere is a fluctuant mass deep to this opening. No surrounding edema or erythema. Breast is nontender.   Psychiatric: She has a normal mood and affect. Her behavior is normal.  Nursing note and vitals reviewed.   ED Course  Procedures  DIAGNOSTIC STUDIES: Oxygen Saturation is 96% on RA, normal by my interpretation.    COORDINATION OF CARE: 5:38 PM Pt presents today due to bleeding from an incision site to her left breast. Discussed next steps with pt and she agreed to the plan.   Labs Review Labs Reviewed  CBC WITH DIFFERENTIAL/PLATELET - Abnormal; Notable for the following:    Platelets 146 (*)    All other components within normal limits  BASIC METABOLIC PANEL - Abnormal; Notable for the following:    Sodium 134 (*)    Potassium 2.9 (*)    Chloride 92 (*)    Glucose, Bld 138 (*)    Creatinine, Ser 2.39 (*)    GFR calc non Af Amer 23 (*)    GFR calc Af Amer 27 (*)    Anion gap 20 (*)    All other components within normal limits  I-STAT CHEM 8, ED - Abnormal; Notable for the following:    Hemoglobin  11.6 (*)  HCT 34.0 (*)    All other components within normal limits    Imaging Review No results found. I have personally reviewed and evaluated these images and lab results as part of my medical decision-making.   EKG Interpretation None      MDM   Final diagnoses:  Encounter for post surgical wound check    I am concerned about possible hematoma given fluctuant mass palpable with serosanguinous drainage from small pinpoint opening along incision line. Pt is afebrile, not tachycardic, not hypotensive. She has no tenderness, no erythema, no purulent discharge. However, will check basic labs and paged on-call surgeon for [facility]ives [facility]  Discussed case with on-call surgeon from [facility]. She is not concerned about pt's presentation and states hematoma unlikely, and it is normal to have drainage. She instructed covering the open area with bacitracin, covering with gauze, and f/u in clinic early next week. Labs reveal no white count and unremarkable H/H. However, pt's creatinine up to 2.39 from baseline with normal BUN. K+ 2.9. Na 134. Anion gap 20. Discussed with attending. 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 Pt denies history of kidney issues. Denies new meds. Denies history of diabetes. Suspect elevated creatinine due to dehydration. Will give 1L NS bolus and recheck. If Cr shows improvement anticipate d/c home with close primary care f/u.  Repeat Chem 8 back to baseline after NS bolus. Instructed to f/u with PCP as above. Incision site opened up a little bit more to 1cm area of dehisience while pt was in the ED. No purulent discharge. I packed the area with iodoform gauze as it was still mildly draining serosanguinous fluid. Pt instructed to f/u with surgery on 1/17 as scheduled. Strict ER return precautions given.   I personally performed the services described in this documentation, which was scribed in my presence. The recorded information has been  reviewed and is accurate.   Anne Ng, PA-C 04/17/15 [place], MD 04/21/15 0830

## 2015-04-17 NOTE — ED Notes (Signed)
PA with pt.

## 2015-04-17 NOTE — ED Notes (Signed)
PA with pt at this time 

## 2015-04-19 ENCOUNTER — Emergency Department (HOSPITAL_COMMUNITY): Payer: PPO

## 2015-04-19 ENCOUNTER — Encounter (HOSPITAL_COMMUNITY): Payer: Self-pay

## 2015-04-19 ENCOUNTER — Observation Stay (HOSPITAL_COMMUNITY)
Admission: EM | Admit: 2015-04-19 | Discharge: 2015-04-20 | Disposition: A | Discharge status: Home / Self Care | Payer: PPO | Attending: Plastic Surgery | Admitting: Plastic Surgery

## 2015-04-19 DIAGNOSIS — T888XXA Other specified complications of surgical and medical care, not elsewhere classified, initial encounter: Secondary | ICD-10-CM

## 2015-04-19 DIAGNOSIS — F329 Major depressive disorder, single episode, unspecified: Secondary | ICD-10-CM | POA: Diagnosis not present

## 2015-04-19 DIAGNOSIS — T792XXA Traumatic secondary and recurrent hemorrhage and seroma, initial encounter: Secondary | ICD-10-CM

## 2015-04-19 DIAGNOSIS — R509 Fever, unspecified: Secondary | ICD-10-CM | POA: Diagnosis not present

## 2015-04-19 DIAGNOSIS — M064 Inflammatory polyarthropathy: Secondary | ICD-10-CM | POA: Insufficient documentation

## 2015-04-19 DIAGNOSIS — K219 Gastro-esophageal reflux disease without esophagitis: Secondary | ICD-10-CM | POA: Diagnosis not present

## 2015-04-19 DIAGNOSIS — M1990 Unspecified osteoarthritis, unspecified site: Secondary | ICD-10-CM | POA: Insufficient documentation

## 2015-04-19 DIAGNOSIS — M329 Systemic lupus erythematosus, unspecified: Secondary | ICD-10-CM | POA: Diagnosis not present

## 2015-04-19 DIAGNOSIS — IMO0001 Reserved for inherently not codable concepts without codable children: Secondary | ICD-10-CM | POA: Diagnosis present

## 2015-04-19 DIAGNOSIS — Z79891 Long term (current) use of opiate analgesic: Secondary | ICD-10-CM | POA: Diagnosis not present

## 2015-04-19 DIAGNOSIS — I10 Essential (primary) hypertension: Secondary | ICD-10-CM | POA: Insufficient documentation

## 2015-04-19 DIAGNOSIS — E039 Hypothyroidism, unspecified: Secondary | ICD-10-CM | POA: Diagnosis not present

## 2015-04-19 DIAGNOSIS — Z791 Long term (current) use of non-steroidal anti-inflammatories (NSAID): Secondary | ICD-10-CM | POA: Diagnosis not present

## 2015-04-19 DIAGNOSIS — Z7982 Long term (current) use of aspirin: Secondary | ICD-10-CM | POA: Diagnosis not present

## 2015-04-19 DIAGNOSIS — Z79899 Other long term (current) drug therapy: Secondary | ICD-10-CM | POA: Insufficient documentation

## 2015-04-19 DIAGNOSIS — L7634 Postprocedural seroma of skin and subcutaneous tissue following other procedure: Principal | ICD-10-CM | POA: Insufficient documentation

## 2015-04-19 LAB — URINALYSIS, ROUTINE W REFLEX MICROSCOPIC
Bilirubin Urine: NEGATIVE
GLUCOSE, UA: NEGATIVE mg/dL
Hgb urine dipstick: NEGATIVE
KETONES UR: NEGATIVE mg/dL
LEUKOCYTES UA: NEGATIVE
NITRITE: NEGATIVE
PH: 6 (ref 5.0–8.0)
PROTEIN: NEGATIVE mg/dL
Specific Gravity, Urine: 1.009 (ref 1.005–1.030)

## 2015-04-19 LAB — CBC WITH DIFFERENTIAL/PLATELET
BASOS ABS: 0 10*3/uL (ref 0.0–0.1)
BASOS PCT: 0 %
Eosinophils Absolute: 0.2 10*3/uL (ref 0.0–0.7)
Eosinophils Relative: 2 %
HEMATOCRIT: 37.5 % (ref 36.0–46.0)
Hemoglobin: 12.2 g/dL (ref 12.0–15.0)
LYMPHS PCT: 21 %
Lymphs Abs: 2.6 10*3/uL (ref 0.7–4.0)
MCH: 27 pg (ref 26.0–34.0)
MCHC: 32.5 g/dL (ref 30.0–36.0)
MCV: 83 fL (ref 78.0–100.0)
Monocytes Absolute: 1.4 10*3/uL — ABNORMAL HIGH (ref 0.1–1.0)
Monocytes Relative: 12 %
NEUTROS ABS: 7.8 10*3/uL — AB (ref 1.7–7.7)
Neutrophils Relative %: 65 %
PLATELETS: 277 10*3/uL (ref 150–400)
RBC: 4.52 MIL/uL (ref 3.87–5.11)
RDW: 13.7 % (ref 11.5–15.5)
WBC: 12 10*3/uL — AB (ref 4.0–10.5)

## 2015-04-19 LAB — COMPREHENSIVE METABOLIC PANEL
ALBUMIN: 3.3 g/dL — AB (ref 3.5–5.0)
ALT: 21 U/L (ref 14–54)
ANION GAP: 7 (ref 5–15)
AST: 20 U/L (ref 15–41)
Alkaline Phosphatase: 69 U/L (ref 38–126)
BILIRUBIN TOTAL: 0.3 mg/dL (ref 0.3–1.2)
BUN: 21 mg/dL — ABNORMAL HIGH (ref 6–20)
CALCIUM: 9.6 mg/dL (ref 8.9–10.3)
CO2: 24 mmol/L (ref 22–32)
Chloride: 107 mmol/L (ref 101–111)
Creatinine, Ser: 0.84 mg/dL (ref 0.44–1.00)
Glucose, Bld: 125 mg/dL — ABNORMAL HIGH (ref 65–99)
POTASSIUM: 4.2 mmol/L (ref 3.5–5.1)
Sodium: 138 mmol/L (ref 135–145)
TOTAL PROTEIN: 7 g/dL (ref 6.5–8.1)

## 2015-04-19 LAB — I-STAT CG4 LACTIC ACID, ED: Lactic Acid, Venous: 0.94 mmol/L (ref 0.5–2.0)

## 2015-04-19 MED ORDER — CITALOPRAM HYDROBROMIDE 20 MG PO TABS
20.0000 mg | ORAL_TABLET | Freq: Every day | ORAL | Status: DC
  Administered 2015-04-19 – 2015-04-20 (×2): 20 mg via ORAL
  Filled 2015-04-19: qty 1
  Filled 2015-04-19: qty 2

## 2015-04-19 MED ORDER — GLUCERNA PO LIQD
1.0000 | Freq: Every morning | ORAL | Status: DC
  Administered 2015-04-19: 237 mL via ORAL
  Administered 2015-04-20: 1 via ORAL
  Filled 2015-04-19 (×3): qty 237

## 2015-04-19 MED ORDER — PANTOPRAZOLE SODIUM 40 MG PO TBEC
40.0000 mg | DELAYED_RELEASE_TABLET | Freq: Every day | ORAL | Status: DC
  Administered 2015-04-19 – 2015-04-20 (×2): 40 mg via ORAL
  Filled 2015-04-19 (×2): qty 1

## 2015-04-19 MED ORDER — DIAZEPAM 5 MG PO TABS: 5.0000 mg | ORAL_TABLET | Freq: Four times a day (QID) | ORAL | Status: DC | PRN

## 2015-04-19 MED ORDER — KCL IN DEXTROSE-NACL 20-5-0.45 MEQ/L-%-% IV SOLN
INTRAVENOUS | Status: DC
  Administered 2015-04-19: 11:00:00 via INTRAVENOUS
  Filled 2015-04-19 (×4): qty 1000

## 2015-04-19 MED ORDER — DEXAMETHASONE SODIUM PHOSPHATE 10 MG/ML IJ SOLN
10.0000 mg | Freq: Once | INTRAMUSCULAR | Status: AC
  Administered 2015-04-19: 10 mg via INTRAVENOUS
  Filled 2015-04-19: qty 1

## 2015-04-19 MED ORDER — ONDANSETRON 4 MG PO TBDP
4.0000 mg | ORAL_TABLET | Freq: Four times a day (QID) | ORAL | Status: DC | PRN
  Administered 2015-04-19: 4 mg via ORAL
  Filled 2015-04-19: qty 1

## 2015-04-19 MED ORDER — ONDANSETRON HCL 4 MG/2ML IJ SOLN
4.0000 mg | Freq: Four times a day (QID) | INTRAMUSCULAR | Status: DC | PRN
Start: 2015-04-19 — End: 2015-04-20

## 2015-04-19 MED ORDER — FAMOTIDINE 10 MG PO TABS
10.0000 mg | ORAL_TABLET | Freq: Two times a day (BID) | ORAL | Status: DC
  Administered 2015-04-19 – 2015-04-20 (×3): 10 mg via ORAL
  Filled 2015-04-19 (×3): qty 1

## 2015-04-19 MED ORDER — MORPHINE SULFATE (PF) 2 MG/ML IV SOLN
1.0000 mg | INTRAVENOUS | Status: DC | PRN
  Administered 2015-04-19 – 2015-04-20 (×3): 1 mg via INTRAVENOUS
  Filled 2015-04-19 (×3): qty 1

## 2015-04-19 MED ORDER — CELECOXIB 200 MG PO CAPS
400.0000 mg | ORAL_CAPSULE | Freq: Two times a day (BID) | ORAL | Status: DC
  Administered 2015-04-19 – 2015-04-20 (×3): 400 mg via ORAL
  Filled 2015-04-19 (×7): qty 2

## 2015-04-19 MED ORDER — GABAPENTIN 300 MG PO CAPS
600.0000 mg | ORAL_CAPSULE | Freq: Three times a day (TID) | ORAL | Status: DC
  Administered 2015-04-19 – 2015-04-20 (×4): 600 mg via ORAL
  Filled 2015-04-19 (×5): qty 2

## 2015-04-19 MED ORDER — HYDROXYCHLOROQUINE SULFATE 200 MG PO TABS
400.0000 mg | ORAL_TABLET | Freq: Every day | ORAL | Status: DC
  Administered 2015-04-19 – 2015-04-20 (×2): 400 mg via ORAL
  Filled 2015-04-19 (×3): qty 2

## 2015-04-19 MED ORDER — DIPHENHYDRAMINE HCL 25 MG PO CAPS
25.0000 mg | ORAL_CAPSULE | Freq: Every evening | ORAL | Status: DC
  Administered 2015-04-19: 25 mg via ORAL
  Filled 2015-04-19: qty 1

## 2015-04-19 MED ORDER — HYDROCHLOROTHIAZIDE 12.5 MG PO CAPS
12.5000 mg | ORAL_CAPSULE | Freq: Every day | ORAL | Status: DC
  Administered 2015-04-19 – 2015-04-20 (×2): 12.5 mg via ORAL
  Filled 2015-04-19 (×2): qty 1

## 2015-04-19 MED ORDER — CEFAZOLIN SODIUM-DEXTROSE 2-3 GM-% IV SOLR
2.0000 g | Freq: Three times a day (TID) | INTRAVENOUS | Status: DC
  Administered 2015-04-19 (×3): 2 g via INTRAVENOUS
  Filled 2015-04-19 (×8): qty 50

## 2015-04-19 MED ORDER — LISINOPRIL 40 MG PO TABS
40.0000 mg | ORAL_TABLET | Freq: Every day | ORAL | Status: DC
  Administered 2015-04-19 – 2015-04-20 (×2): 40 mg via ORAL
  Filled 2015-04-19: qty 1
  Filled 2015-04-19: qty 2

## 2015-04-19 MED ORDER — ACETAMINOPHEN 325 MG PO TABS: 650.0000 mg | ORAL_TABLET | Freq: Four times a day (QID) | ORAL | Status: DC | PRN

## 2015-04-19 MED ORDER — ACETAMINOPHEN 650 MG RE SUPP: 650.0000 mg | Freq: Four times a day (QID) | RECTAL | Status: DC | PRN

## 2015-04-19 MED ORDER — LORATADINE 10 MG PO TABS
10.0000 mg | ORAL_TABLET | Freq: Every day | ORAL | Status: DC
  Administered 2015-04-19 – 2015-04-20 (×2): 10 mg via ORAL
  Filled 2015-04-19 (×2): qty 1

## 2015-04-19 MED ORDER — HYDROCODONE-ACETAMINOPHEN 5-325 MG PO TABS
1.0000 | ORAL_TABLET | Freq: Two times a day (BID) | ORAL | Status: DC | PRN
  Administered 2015-04-19: 1 via ORAL
  Filled 2015-04-19 (×2): qty 1

## 2015-04-19 NOTE — H&P (Signed)
[name] is an 45 y.o. female.   Chief Complaint: fevers HPI: 45 yo female post bilateral breast reduction 03/26/15 with Dr. Marla Roe. Presents with fever 101.5 at home. Experienced onset drainage left breast 2 days ago and seen in ED and wound packed. Patient reports increase in size wound and pain similar to right after surgery. No emesis.   Past Medical History  Diagnosis Date  . Hypertension     a  . Anxiety   . Depression     during divorce & legal matters  . Osteoarthritis   . GERD (gastroesophageal reflux disease)   . IBS (irritable bowel syndrome)   . Lupus (HCC) hx positive ANA    followed by Dr Gavin Pound; possible lupus  . History of polycystic ovarian disease S/P BSO  . 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 Chronic joint pain   . Myalgia   . History of kidney stones     Dr Phebe Colla  . Hx of anxiety disorder   . Hypothyroidism     no meds  . PONV (postoperative nausea and vomiting)   . Symptomatic mammary hypertrophy   . URI (upper respiratory infection)     currently on cefdinir    Past Surgical History  Procedure Laterality Date  . Eye surgery      Retinal tears surgery bilateral  . Tonsillectomy    . Carpal tunnel release      bilateral; Dr Sherwood Gambler  . Plantar fascia surgery      right  . Plantar fascia surgery  02/2011    left  . Cystoscopy w/ ureteral stent placement  11/12/2011    Procedure: CYSTOSCOPY WITH RETROGRADE PYELOGRAM/URETERAL STENT PLACEMENT;  Surgeon: Alexis Frock, MD;  Location: [facility];  Service: Urology;  Laterality: Right;  . Laparoscopic cholecystectomy  05-31-2007  . Cysto/ right retrograde ureteral pyelogram  07-09-2004    HX BILATERAL RENAL STONES/ RIGHT FLANK PAIN  . Excision left bartholin gland  02-05-2002  . Laparoscopy with right salpingo-oophectomy/ lysis adhesions and ablation endometriosis  05-17-2001  . Right ureteroscopic stone extraction  08-04-2000  . Laparoscopic laser ablation endometriosis and left  salpingo-oophorectomy  11-03-1999  . Laparoscopic assisted vaginal hysterectomy  2000  . Dilation and curettage of uterus      X5  . Ankle surgery      X3  . Appendectomy  1991    exploratory lap  . Lumbar laminectomy  2003    L4 - L5  . Ureteral stent placement  11/12/11    Dr Tresa Moore  . Cystoscopy w/ ureteral stent removal  12/07/2011    Procedure: CYSTOSCOPY WITH STENT REMOVAL;  Surgeon: Alexis Frock, MD;  Location: [facility];  Service: Urology;  Laterality: Right;  . Cystoscopy/retrograde/ureteroscopy/stone extraction with basket  12/07/2011    Procedure: CYSTOSCOPY/RETROGRADE/URETEROSCOPY/STONE EXTRACTION WITH BASKET;  Surgeon: Alexis Frock, MD;  Location: [facility];  Service: Urology;  Laterality: Right;  . Colonoscopy      in 1990s  . Breast reduction surgery Bilateral 03/26/2015    Procedure: BILATERAL BREAST REDUCTION ;  Surgeon: Wallace Going, DO;  Location: [place];  Service: Plastics;  Laterality: Bilateral;  . Liposuction Bilateral 03/26/2015    Procedure: WITH LIPOSUCTION;  Surgeon: Wallace Going, DO;  Location: [place];  Service: Plastics;  Laterality: Bilateral;    Family History  Problem Relation Age of Onset  . Hypertension Mother   . Colon polyps Mother   .  Atrial fibrillation Mother     Dr Angelena Form  . Heart attack Father 40  . Diabetes Father   . Hypertension Sister   . Heart attack Paternal Grandmother 9  . Breast cancer Paternal Aunt   . Colon cancer Maternal Uncle   . Colon cancer Paternal Uncle   . Colon polyps Maternal Grandmother   . Stroke Maternal Grandmother 76  . Diabetes Paternal Grandfather   . Stroke Maternal Grandfather 20   Social History:  reports that she has never smoked. She has never used smokeless tobacco. She reports that she drinks about 0.6 oz of alcohol per week. She reports that she does not use illicit drugs.  Allergies:  Allergies  Allergen  Reactions  . Chloraprep One Step [Chlorhexidine Gluconate] Rash    Developed severe rash where chloraprep was used on chest area  . Ivp Dye [Iodinated Diagnostic Agents] Rash and Other (See Comments)    Flushing, minor facial rash & dyspnea  . Nalbuphine Shortness Of Breath and Rash    Nubain caused respiratory distress & rash  . Septra [Sulfamethoxazole-Trimethoprim] Shortness Of Breath and Rash  . Sulfamethoxazole-Trimethoprim Rash    rash  . Tylox [Oxycodone-Acetaminophen] Rash     Results for orders placed or performed during the hospital encounter of 04/19/15 (from the past 48 hour(s))  Urinalysis, Routine w reflex microscopic (not at [facility])     Status: Abnormal   Collection Time: 04/19/15  6:30 AM  Result Value Ref Range   Color, Urine STRAW (A) YELLOW   APPearance CLEAR CLEAR   Specific Gravity, Urine 1.009 1.005 - 1.030   pH 6.0 5.0 - 8.0   Glucose, UA NEGATIVE NEGATIVE mg/dL   Hgb urine dipstick NEGATIVE NEGATIVE   Bilirubin Urine NEGATIVE NEGATIVE   Ketones, ur NEGATIVE NEGATIVE mg/dL   Protein, ur NEGATIVE NEGATIVE mg/dL   Nitrite NEGATIVE NEGATIVE   Leukocytes, UA NEGATIVE NEGATIVE    Comment: MICROSCOPIC NOT DONE ON URINES WITH NEGATIVE PROTEIN, BLOOD, LEUKOCYTES, NITRITE, OR GLUCOSE <1000 mg/dL.  I-Stat CG4 Lactic Acid, ED  (not at [facility])     Status: None   Collection Time: 04/19/15  6:49 AM  Result Value Ref Range   Lactic Acid, Venous 0.94 0.5 - 2.0 mmol/L  Comprehensive metabolic panel     Status: Abnormal   Collection Time: 04/19/15  6:50 AM  Result Value Ref Range   Sodium 138 135 - 145 mmol/L   Potassium 4.2 3.5 - 5.1 mmol/L   Chloride 107 101 - 111 mmol/L   CO2 24 22 - 32 mmol/L   Glucose, Bld 125 (H) 65 - 99 mg/dL   BUN 21 (H) 6 - 20 mg/dL   Creatinine, Ser 0.84 0.44 - 1.00 mg/dL   Calcium 9.6 8.9 - 10.3 mg/dL   Total Protein 7.0 6.5 - 8.1 g/dL   Albumin 3.3 (L) 3.5 - 5.0 g/dL   AST 20 15 - 41 U/L   ALT 21 14 - 54 U/L   Alkaline Phosphatase 69 38 -  126 U/L   Total Bilirubin 0.3 0.3 - 1.2 mg/dL   GFR calc non Af Amer >60 >60 mL/min   GFR calc Af Amer >60 >60 mL/min    Comment: (NOTE) The eGFR has been calculated using the CKD EPI equation. This calculation has not been validated in all clinical situations. eGFR's persistently <60 mL/min signify possible Chronic Kidney Disease.    Anion gap 7 5 - 15  CBC with Differential     Status:  Abnormal   Collection Time: 04/19/15  6:50 AM  Result Value Ref Range   WBC 12.0 (H) 4.0 - 10.5 K/uL   RBC 4.52 3.87 - 5.11 MIL/uL   Hemoglobin 12.2 12.0 - 15.0 g/dL   HCT 37.5 36.0 - 46.0 %   MCV 83.0 78.0 - 100.0 fL    Comment: RESULTS VERIFIED VIA RECOLLECT   MCH 27.0 26.0 - 34.0 pg   MCHC 32.5 30.0 - 36.0 g/dL   RDW 13.7 11.5 - 15.5 %   Platelets 277 150 - 400 K/uL   Neutrophils Relative % 65 %   Neutro Abs 7.8 (H) 1.7 - 7.7 K/uL   Lymphocytes Relative 21 %   Lymphs Abs 2.6 0.7 - 4.0 K/uL   Monocytes Relative 12 %   Monocytes Absolute 1.4 (H) 0.1 - 1.0 K/uL   Eosinophils Relative 2 %   Eosinophils Absolute 0.2 0.0 - 0.7 K/uL   Basophils Relative 0 %   Basophils Absolute 0.0 0.0 - 0.1 K/uL   Dg Chest 2 View  04/19/2015  CLINICAL DATA:  Fever with left-sided breast drainage and swelling. Breast reduction 03/26/2015 with persistent swelling and bleeding at the surgical site. EXAM: CHEST  2 VIEW COMPARISON:  05/25/2012 and 07/21/2008 FINDINGS: Lungs are adequately inflated without focal consolidation or effusion. Cardiomediastinal silhouette and remainder of the exam is unchanged. IMPRESSION: No active cardiopulmonary disease. Electronically Signed   By: Marin Olp M.D.   On: 04/19/2015 07:20    ROS  Blood pressure 117/104, pulse 100, temperature 99.2 F (37.3 C), temperature source Oral, resp. rate 18, SpO2 96 %. Physical Exam  Alert, NAD Clear to auscultation Normal heart sounds Breasts: soft, right incisions intact. Left with 2-3 cm opening vertical scar, removed packing and  seroma fluid noted, undermining up to 12 cm superiorly, faint pink color.  Assessment/Plan Spontaneous draining seroma left breast post reduction. Plan admit for observation and IV antibiotics. Afebrile here. WBC 12. Will be evaluated by Dr. Marla Roe today. Discussed options packing wound vs return to OR, possible drain placement.    Irene Limbo, MD [facility] [phone]

## 2015-04-19 NOTE — ED Notes (Signed)
Attempted report x1. 

## 2015-04-19 NOTE — ED Notes (Signed)
Pt beng transported to 6N via stretcher. Spouse w/pt.

## 2015-04-19 NOTE — ED Notes (Signed)
Pt did take tylenol prior to arrival

## 2015-04-19 NOTE — ED Notes (Addendum)
Surgeon in w/pt - removed packing to left breast. Pt tolerated well.

## 2015-04-19 NOTE — ED Notes (Signed)
Surgery at bedside.

## 2015-04-19 NOTE — Progress Notes (Signed)
Pt admitted to 6N22 via stretcher from ED.  Pt AAO X 4.  Pt on RA.  Pt has 20G to rt FA SL.  Pt has healing incisions to both breasts, but lt breast has an open wound with ABD pad over it.  Husband and belongings to bedside. Pt has no questions at the moment.  Will continue to monitor.

## 2015-04-19 NOTE — ED Notes (Signed)
Fever, and bleeding and drainage from left breast where she had a reduction in December.

## 2015-04-20 NOTE — Progress Notes (Signed)
Pt discharged to home.  Discharge instructions explained to pt.  Pt has no questions at the time of discharge.  Pt states she has all belongings.  IV removed.  Pt taken off the floor via wheelchair by NT.

## 2015-04-20 NOTE — Discharge Summary (Signed)
Physician Discharge Summary  Patient ID: [name] MRN: [id number] DOB/AGE: 46-May-1972 46 y.o.  Admit date: 04/19/2015 Discharge date: 04/20/2015  Admission Diagnoses: left breast seroma  Discharge Diagnoses:  Active Problems:   Seroma complicating a procedure, initial encounter   Discharged Condition: good  Hospital Course: The patient was admitted for IV antibiotics and observation.  She reported an increase temperature.  She was managed on the regular surgery unit and did well overnight.  She will be seen in the office in 3 days. She was instructed on wet to dry dressings twice daily.  Consults: None  Significant Diagnostic Studies: labs  Treatments: IV hydration and antibiotics  Discharge Exam: Blood pressure 121/63, pulse 78, temperature 98.3 F (36.8 C), temperature source Oral, resp. rate 18, weight 95.709 kg (211 lb), SpO2 96 %. General appearance: alert, cooperative and no distress Incision/Wound:  Disposition: 01-Home or Self Care     Medication List    TAKE these medications        aspirin 81 MG tablet  Take 81 mg by mouth daily.     celecoxib 200 MG capsule  Commonly known as:  CELEBREX  Take 200 capsules by mouth 2 (two) times daily.     cholecalciferol 1000 units tablet  Commonly known as:  VITAMIN D  Take 1,000 Units by mouth daily.     citalopram 20 MG tablet  Commonly known as:  CELEXA  Take 1 tablet (20 mg total) by mouth daily.     diazepam 5 MG tablet  Commonly known as:  VALIUM  Take 5 mg by mouth every 6 (six) hours as needed for anxiety.     fish oil-omega-3 fatty acids 1000 MG capsule  Take 1 g by mouth daily.     gabapentin 300 MG capsule  Commonly known as:  NEURONTIN  Take 2 capsules (600 mg total) by mouth 3 (three) times daily.     hydrochlorothiazide 12.5 MG capsule  Commonly known as:  MICROZIDE  Take 1 capsule (12.5 mg total) by mouth daily.     HYDROcodone-acetaminophen 5-325 MG tablet  Commonly known as:   NORCO/VICODIN  Take 1 tablet by mouth every 12 (twelve) hours as needed for moderate pain.     hydroxychloroquine 200 MG tablet  Commonly known as:  PLAQUENIL  Take 400 mg by mouth daily.     lisinopril 40 MG tablet  Commonly known as:  PRINIVIL,ZESTRIL  TAKE 1 TABLET EVERY DAY     Lysine 500 MG Caps  Take by mouth.     multivitamin with minerals Tabs tablet  Take 1 tablet by mouth daily.     omeprazole 40 MG capsule  Commonly known as:  PRILOSEC  TAKE 1 CAPSULE EVERY MORNING         Signed: Wallace Going 04/20/2015, 9:56 AM

## 2015-04-21 NOTE — ED Provider Notes (Addendum)
CSN: [id number]     Arrival date & time 04/19/15  [id number] History   First MD Initiated Contact with Patient 04/19/15 0730     Chief Complaint  Patient presents with  . Post-op Problem     (Consider location/radiation/quality/duration/timing/severity/associated sxs/prior Treatment) HPI  Past Medical History  Diagnosis Date  . Hypertension     a  . Anxiety   . Depression     during divorce & legal matters  . Osteoarthritis   . GERD (gastroesophageal reflux disease)   . IBS (irritable bowel syndrome)   . Lupus (HCC) hx positive ANA    followed by Dr Gavin Pound; possible lupus  . History of polycystic ovarian disease S/P BSO  . 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 Chronic joint pain   . Myalgia   . History of kidney stones     Dr Phebe Colla  . Hx of anxiety disorder   . Hypothyroidism     no meds  . PONV (postoperative nausea and vomiting)   . Symptomatic mammary hypertrophy   . URI (upper respiratory infection)     currently on cefdinir   Past Surgical History  Procedure Laterality Date  . Eye surgery      Retinal tears surgery bilateral  . Tonsillectomy    . Carpal tunnel release      bilateral; Dr Sherwood Gambler  . Plantar fascia surgery      right  . Plantar fascia surgery  02/2011    left  . Cystoscopy w/ ureteral stent placement  11/12/2011    Procedure: CYSTOSCOPY WITH RETROGRADE PYELOGRAM/URETERAL STENT PLACEMENT;  Surgeon: Alexis Frock, MD;  Location: [facility];  Service: Urology;  Laterality: Right;  . Laparoscopic cholecystectomy  05-31-2007  . Cysto/ right retrograde ureteral pyelogram  07-09-2004    HX BILATERAL RENAL STONES/ RIGHT FLANK PAIN  . Excision left bartholin gland  02-05-2002  . Laparoscopy with right salpingo-oophectomy/ lysis adhesions and ablation endometriosis  05-17-2001  . Right ureteroscopic stone extraction  08-04-2000  . Laparoscopic laser ablation endometriosis and left salpingo-oophorectomy  11-03-1999  . Laparoscopic assisted vaginal  hysterectomy  2000  . Dilation and curettage of uterus      X5  . Ankle surgery      X3  . Appendectomy  1991    exploratory lap  . Lumbar laminectomy  2003    L4 - L5  . Ureteral stent placement  11/12/11    Dr Tresa Moore  . Cystoscopy w/ ureteral stent removal  12/07/2011    Procedure: CYSTOSCOPY WITH STENT REMOVAL;  Surgeon: Alexis Frock, MD;  Location: [facility];  Service: Urology;  Laterality: Right;  . Cystoscopy/retrograde/ureteroscopy/stone extraction with basket  12/07/2011    Procedure: CYSTOSCOPY/RETROGRADE/URETEROSCOPY/STONE EXTRACTION WITH BASKET;  Surgeon: Alexis Frock, MD;  Location: [facility];  Service: Urology;  Laterality: Right;  . Colonoscopy      in 1990s  . Breast reduction surgery Bilateral 03/26/2015    Procedure: BILATERAL BREAST REDUCTION ;  Surgeon: Wallace Going, DO;  Location: [place];  Service: Plastics;  Laterality: Bilateral;  . Liposuction Bilateral 03/26/2015    Procedure: WITH LIPOSUCTION;  Surgeon: Wallace Going, DO;  Location: [place];  Service: Plastics;  Laterality: Bilateral;   Family History  Problem Relation Age of Onset  . Hypertension Mother   . Colon polyps Mother   . Atrial fibrillation Mother     Dr Angelena Form  . Heart attack Father 82  .  Diabetes Father   . Hypertension Sister   . Heart attack Paternal Grandmother 65  . Breast cancer Paternal Aunt   . Colon cancer Maternal Uncle   . Colon cancer Paternal Uncle   . Colon polyps Maternal Grandmother   . Stroke Maternal Grandmother 76  . Diabetes Paternal Grandfather   . Stroke Maternal Grandfather 44   Social History  Substance Use Topics  . Smoking status: Never Smoker   . Smokeless tobacco: Never Used  . Alcohol Use: 0.6 oz/week    1 Shots of liquor per week     Comment:  very rarely   OB History    No data available     Review of Systems    Allergies  Chloraprep one step; Ivp dye;  Nalbuphine; Septra; Sulfamethoxazole-trimethoprim; and Tylox  Home Medications   Prior to Admission medications   Medication Sig Start Date End Date Taking? Authorizing Provider  aspirin 81 MG tablet Take 81 mg by mouth daily.    Yes Historical Provider, MD  celecoxib (CELEBREX) 200 MG capsule Take 200 capsules by mouth 2 (two) times daily.  12/10/13  Yes Historical Provider, MD  cholecalciferol (VITAMIN D) 1000 UNITS tablet Take 1,000 Units by mouth daily.   Yes Historical Provider, MD  citalopram (CELEXA) 20 MG tablet Take 1 tablet (20 mg total) by mouth daily. 01/22/15  Yes Hendricks Limes, MD  diazepam (VALIUM) 5 MG tablet Take 5 mg by mouth every 6 (six) hours as needed for anxiety.   Yes Historical Provider, MD  fish oil-omega-3 fatty acids 1000 MG capsule Take 1 g by mouth daily.   Yes Historical Provider, MD  gabapentin (NEURONTIN) 300 MG capsule Take 2 capsules (600 mg total) by mouth 3 (three) times daily. 02/20/14  Yes Marcial Pacas, MD  hydrochlorothiazide (MICROZIDE) 12.5 MG capsule Take 1 capsule (12.5 mg total) by mouth daily. 04/16/15  Yes Binnie Rail, MD  HYDROcodone-acetaminophen (NORCO/VICODIN) 5-325 MG per tablet Take 1 tablet by mouth every 12 (twelve) hours as needed for moderate pain. 02/20/14  Yes Marcial Pacas, MD  hydroxychloroquine (PLAQUENIL) 200 MG tablet Take 400 mg by mouth daily.    Yes Historical Provider, MD  lisinopril (PRINIVIL,ZESTRIL) 40 MG tablet TAKE 1 TABLET EVERY DAY 04/16/15  Yes Binnie Rail, MD  Lysine 500 MG CAPS Take by mouth.   Yes Historical Provider, MD  Multiple Vitamin (MULTIVITAMIN WITH MINERALS) TABS tablet Take 1 tablet by mouth daily.   Yes Historical Provider, MD  omeprazole (PRILOSEC) 40 MG capsule TAKE 1 CAPSULE EVERY MORNING   Yes Ladene Artist, MD   BP 124/62 mmHg  Pulse 71  Temp(Src) 98.1 F (36.7 C) (Oral)  Resp 16  Wt 211 lb (95.709 kg)  SpO2 100% Physical Exam  ED Course  Procedures (including critical care time) Labs  Review Labs Reviewed  COMPREHENSIVE METABOLIC PANEL - Abnormal; Notable for the following:    Glucose, Bld 125 (*)    BUN 21 (*)    Albumin 3.3 (*)    All other components within normal limits  CBC WITH DIFFERENTIAL/PLATELET - Abnormal; Notable for the following:    WBC 12.0 (*)    Neutro Abs 7.8 (*)    Monocytes Absolute 1.4 (*)    All other components within normal limits  URINALYSIS, ROUTINE W REFLEX MICROSCOPIC (NOT AT [facility]) - Abnormal; Notable for the following:    Color, Urine STRAW (*)    All other components within normal limits  I-STAT CG4  LACTIC ACID, ED    Imaging Review No results found. I have personally reviewed and evaluated these images and lab results as part of my medical decision-making.   EKG Interpretation None      MDM   Final diagnoses:  None    Pt was not seen by Dr. Roderic Palau,  The pt was seen and admitted by plastic md    Milton Ferguson, MD 04/22/15 1514  Milton Ferguson, MD 06/16/15 [id number]

## 2015-05-05 DIAGNOSIS — L309 Dermatitis, unspecified: Secondary | ICD-10-CM | POA: Diagnosis not present

## 2015-06-02 DIAGNOSIS — M4722 Other spondylosis with radiculopathy, cervical region: Secondary | ICD-10-CM | POA: Diagnosis not present

## 2015-06-02 DIAGNOSIS — M5412 Radiculopathy, cervical region: Secondary | ICD-10-CM | POA: Diagnosis not present

## 2015-06-02 DIAGNOSIS — M5020 Other cervical disc displacement, unspecified cervical region: Secondary | ICD-10-CM | POA: Diagnosis not present

## 2015-06-02 DIAGNOSIS — M5030 Other cervical disc degeneration, unspecified cervical region: Secondary | ICD-10-CM | POA: Diagnosis not present

## 2015-06-04 ENCOUNTER — Other Ambulatory Visit (INDEPENDENT_AMBULATORY_CARE_PROVIDER_SITE_OTHER): Payer: PPO

## 2015-06-04 ENCOUNTER — Ambulatory Visit (INDEPENDENT_AMBULATORY_CARE_PROVIDER_SITE_OTHER): Payer: PPO | Admitting: Internal Medicine

## 2015-06-04 ENCOUNTER — Encounter: Payer: Self-pay | Admitting: Internal Medicine

## 2015-06-04 VITALS — BP 120/82 | HR 69 | Temp 98.6°F | Resp 18 | Wt 249.0 lb

## 2015-06-04 DIAGNOSIS — E039 Hypothyroidism, unspecified: Secondary | ICD-10-CM | POA: Diagnosis not present

## 2015-06-04 DIAGNOSIS — R7309 Other abnormal glucose: Secondary | ICD-10-CM | POA: Diagnosis not present

## 2015-06-04 DIAGNOSIS — M2550 Pain in unspecified joint: Secondary | ICD-10-CM | POA: Diagnosis not present

## 2015-06-04 DIAGNOSIS — M5010 Cervical disc disorder with radiculopathy, unspecified cervical region: Secondary | ICD-10-CM

## 2015-06-04 DIAGNOSIS — M797 Fibromyalgia: Secondary | ICD-10-CM

## 2015-06-04 DIAGNOSIS — R739 Hyperglycemia, unspecified: Secondary | ICD-10-CM | POA: Diagnosis not present

## 2015-06-04 DIAGNOSIS — I10 Essential (primary) hypertension: Secondary | ICD-10-CM

## 2015-06-04 DIAGNOSIS — K219 Gastro-esophageal reflux disease without esophagitis: Secondary | ICD-10-CM | POA: Insufficient documentation

## 2015-06-04 LAB — CBC WITH DIFFERENTIAL/PLATELET
BASOS ABS: 0 10*3/uL (ref 0.0–0.1)
BASOS PCT: 0.6 % (ref 0.0–3.0)
Eosinophils Absolute: 0.2 10*3/uL (ref 0.0–0.7)
Eosinophils Relative: 3.8 % (ref 0.0–5.0)
HCT: 37.7 % (ref 36.0–46.0)
HEMOGLOBIN: 12.6 g/dL (ref 12.0–15.0)
LYMPHS PCT: 33.1 % (ref 12.0–46.0)
Lymphs Abs: 2 10*3/uL (ref 0.7–4.0)
MCHC: 33.3 g/dL (ref 30.0–36.0)
MCV: 79.6 fl (ref 78.0–100.0)
MONOS PCT: 8.8 % (ref 3.0–12.0)
Monocytes Absolute: 0.5 10*3/uL (ref 0.1–1.0)
NEUTROS ABS: 3.3 10*3/uL (ref 1.4–7.7)
Neutrophils Relative %: 53.7 % (ref 43.0–77.0)
PLATELETS: 238 10*3/uL (ref 150.0–400.0)
RBC: 4.74 Mil/uL (ref 3.87–5.11)
RDW: 14.2 % (ref 11.5–15.5)
WBC: 6.1 10*3/uL (ref 4.0–10.5)

## 2015-06-04 LAB — COMPREHENSIVE METABOLIC PANEL
ALBUMIN: 4 g/dL (ref 3.5–5.2)
ALK PHOS: 70 U/L (ref 39–117)
ALT: 21 U/L (ref 0–35)
AST: 24 U/L (ref 0–37)
BILIRUBIN TOTAL: 0.4 mg/dL (ref 0.2–1.2)
BUN: 21 mg/dL (ref 6–23)
CALCIUM: 9.5 mg/dL (ref 8.4–10.5)
CO2: 30 mEq/L (ref 19–32)
CREATININE: 0.68 mg/dL (ref 0.40–1.20)
Chloride: 104 mEq/L (ref 96–112)
GFR: 99.21 mL/min (ref >60.00)
Glucose, Bld: 88 mg/dL (ref 70–99)
Potassium: 3.8 mEq/L (ref 3.5–5.1)
Sodium: 141 mEq/L (ref 135–145)
TOTAL PROTEIN: 7.1 g/dL (ref 6.0–8.3)

## 2015-06-04 LAB — HEMOGLOBIN A1C: Hgb A1c MFr Bld: 5.9 % (ref 4.6–6.5)

## 2015-06-04 LAB — LIPID PANEL
CHOLESTEROL: 162 mg/dL (ref 0–200)
HDL: 50.2 mg/dL (ref >39.00)
LDL Cholesterol: 92 mg/dL (ref 0–99)
NonHDL: 111.97
TRIGLYCERIDES: 102 mg/dL (ref 0.0–149.0)
Total CHOL/HDL Ratio: 3
VLDL: 20.4 mg/dL (ref 0.0–40.0)

## 2015-06-04 LAB — TSH: TSH: 2.4 u[IU]/mL (ref 0.35–4.50)

## 2015-06-04 MED ORDER — VITAMIN B-12 1000 MCG PO TABS: 1000.0000 ug | ORAL_TABLET | Freq: Every day | ORAL | Status: DC

## 2015-06-04 NOTE — Progress Notes (Signed)
Subjective:    Patient ID: [name], female    DOB: 01-Apr-1971, 45 y.o.   MRN: [id number]  HPI She is here to establish with a new pcp and here for follow up.   Hypertension: She is taking her medication daily. She is compliant with a low sodium diet.  She denies chest pain, palpitations, edema, shortness of breath and regular headaches. She is exercising regularly.  She does not monitor her blood pressure at home.    Hypothyroidism:  She is not taking her medication daily - she was taken off of it last fall by Dr. Linna Darner.  She has not been able to lose weight despite regular exercise and a healthy diet.  She is eating about 1800 calories a day.  She would like her thyroid rechecked.    GERD:  She is taking otc omeprazole daily.  She denies any GERD symptoms and feels her GERD is well controlled.   Osteoarthritis, possible inflammatory arthritis, positive ANA, fibromyalgia:  She is follows with Dr. Trudie Reed.  She is taking celebrex and plaquenil.    Cervical radiculopathy: She has cervical disc disease and is planning on having neck surgery.  She is taking celebrex and gabapentin.    Anxiety;  She takes celexa daily.  She takes valium nightly to help her sleep.   Medications and allergies reviewed with patient and updated if appropriate.  Patient Active Problem List   Diagnosis Date Noted  . Seroma complicating a procedure, initial encounter 04/19/2015  . Hyperglycemia 09/22/2014  . Cervical disc disorder with radiculopathy of cervical region 05/23/2014  . Neck pain 02/03/2014  . Ulnar neuropathy 01/16/2014  . Fibromyalgia 01/16/2014  . 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 Other abnormal glucose 04/29/2013  . Arthralgia 04/29/2013  . Panic attacks 01/15/2013  . IBS (irritable bowel syndrome) 12/14/2011  . Premature menopause on hormone replacement therapy 09/02/2011  . B12 deficiency 06/30/2009  . Hypothyroidism 03/02/2007  . POLYCYSTIC OVARIAN DISEASE 03/02/2007  .  Essential hypertension 03/02/2007    Current Outpatient Prescriptions on File Prior to Visit  Medication Sig Dispense Refill  . aspirin 81 MG tablet Take 81 mg by mouth daily.     . celecoxib (CELEBREX) 200 MG capsule Take 200 capsules by mouth 2 (two) times daily.     . cholecalciferol (VITAMIN D) 1000 UNITS tablet Take 1,000 Units by mouth daily.    . citalopram (CELEXA) 20 MG tablet Take 1 tablet (20 mg total) by mouth daily. 90 tablet 0  . diazepam (VALIUM) 5 MG tablet Take 5 mg by mouth every 6 (six) hours as needed for anxiety.    . fish oil-omega-3 fatty acids 1000 MG capsule Take 1 g by mouth daily.    [name] gabapentin (NEURONTIN) 300 MG capsule Take 2 capsules (600 mg total) by mouth 3 (three) times daily. 180 capsule 11  . hydrochlorothiazide (MICROZIDE) 12.5 MG capsule Take 1 capsule (12.5 mg total) by mouth daily. 90 capsule 1  . HYDROcodone-acetaminophen (NORCO/VICODIN) 5-325 MG per tablet Take 1 tablet by mouth every 12 (twelve) hours as needed for moderate pain. 60 tablet 0  . hydroxychloroquine (PLAQUENIL) 200 MG tablet Take 400 mg by mouth daily.     [name] lisinopril (PRINIVIL,ZESTRIL) 40 MG tablet TAKE 1 TABLET EVERY DAY 90 tablet 1  . Lysine 500 MG CAPS Take by mouth.    . Multiple Vitamin (MULTIVITAMIN WITH MINERALS) TABS tablet Take 1 tablet by mouth daily.    [name] omeprazole (PRILOSEC) 40 MG  capsule TAKE 1 CAPSULE EVERY MORNING 90 capsule 0   No current facility-administered medications on file prior to visit.    Past Medical History  Diagnosis Date  . Hypertension     a  . Anxiety   . Depression     during divorce & legal matters  . Osteoarthritis   . GERD (gastroesophageal reflux disease)   . IBS (irritable bowel syndrome)   . Lupus (HCC) hx positive ANA    followed by Dr Gavin Pound; possible lupus  . History of polycystic ovarian disease S/P BSO  . 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 Chronic joint pain   . Myalgia   . History of kidney stones     Dr Phebe Colla  . Hx of anxiety disorder   . Hypothyroidism     no meds  . PONV (postoperative nausea and vomiting)   . Symptomatic mammary hypertrophy   . URI (upper respiratory infection)     currently on cefdinir    Past Surgical History  Procedure Laterality Date  . Eye surgery      Retinal tears surgery bilateral  . Tonsillectomy    . Carpal tunnel release      bilateral; Dr Sherwood Gambler  . Plantar fascia surgery      right  . Plantar fascia surgery  02/2011    left  . Cystoscopy w/ ureteral stent placement  11/12/2011    Procedure: CYSTOSCOPY WITH RETROGRADE PYELOGRAM/URETERAL STENT PLACEMENT;  Surgeon: Alexis Frock, MD;  Location: [facility];  Service: Urology;  Laterality: Right;  . Laparoscopic cholecystectomy  05-31-2007  . Cysto/ right retrograde ureteral pyelogram  07-09-2004    HX BILATERAL RENAL STONES/ RIGHT FLANK PAIN  . Excision left bartholin gland  02-05-2002  . Laparoscopy with right salpingo-oophectomy/ lysis adhesions and ablation endometriosis  05-17-2001  . Right ureteroscopic stone extraction  08-04-2000  . Laparoscopic laser ablation endometriosis and left salpingo-oophorectomy  11-03-1999  . Laparoscopic assisted vaginal hysterectomy  2000  . Dilation and curettage of uterus      X5  . Ankle surgery      X3  . Appendectomy  1991    exploratory lap  . Lumbar laminectomy  2003    L4 - L5  . Ureteral stent placement  11/12/11    Dr Tresa Moore  . Cystoscopy w/ ureteral stent removal  12/07/2011    Procedure: CYSTOSCOPY WITH STENT REMOVAL;  Surgeon: Alexis Frock, MD;  Location: [facility];  Service: Urology;  Laterality: Right;  . Cystoscopy/retrograde/ureteroscopy/stone extraction with basket  12/07/2011    Procedure: CYSTOSCOPY/RETROGRADE/URETEROSCOPY/STONE EXTRACTION WITH BASKET;  Surgeon: Alexis Frock, MD;  Location: [facility];  Service: Urology;  Laterality: Right;  . Colonoscopy      in 1990s  . Breast reduction surgery Bilateral  03/26/2015    Procedure: BILATERAL BREAST REDUCTION ;  Surgeon: Wallace Going, DO;  Location: [place];  Service: Plastics;  Laterality: Bilateral;  . Liposuction Bilateral 03/26/2015    Procedure: WITH LIPOSUCTION;  Surgeon: Wallace Going, DO;  Location: [place];  Service: Plastics;  Laterality: Bilateral;    Social History   Social History  . Marital Status: Married    Spouse Name: Darnelle Maffucci  . Number of Children: 0  . Years of Education: college   Occupational History  . disabled    Social History Main Topics  . Smoking status: Never Smoker   . Smokeless tobacco: Never Used  . Alcohol Use:  0.6 oz/week    1 Shots of liquor per week     Comment:  very rarely  . Drug Use: No  . Sexual Activity: Yes    Birth Control/ Protection: Surgical   Other Topics Concern  . Not on file   Social History Narrative   Patient Lives at home with her husband Darnelle Maffucci)   Disabled.   Education two years of college.   Right handed.   Caffeine coffee and sweet tea. Not daily.    Family History  Problem Relation Age of Onset  . Hypertension Mother   . Colon polyps Mother   . Atrial fibrillation Mother     Dr Angelena Form  . Heart attack Father 29  . Diabetes Father   . Hypertension Sister   . Heart attack Paternal Grandmother 74  . Breast cancer Paternal Aunt   . Colon cancer Maternal Uncle   . Colon cancer Paternal Uncle   . Colon polyps Maternal Grandmother   . Stroke Maternal Grandmother 76  . Diabetes Paternal Grandfather   . Stroke Maternal Grandfather 37    Review of Systems  Constitutional: Negative for fever.  Respiratory: Positive for cough (occasional, dry  - weather changes, alleriges). Negative for shortness of breath and wheezing.   Cardiovascular: Negative for chest pain, palpitations and leg swelling.  Gastrointestinal:       Occ GERD  Musculoskeletal: Positive for neck pain.  Neurological: Positive for numbness (bilateral  numbness first three fingers on both hands). Negative for light-headedness and headaches.       Objective:   Filed Vitals:   06/04/15 0808  BP: 120/82  Pulse: 69  Temp: 98.6 F (37 C)  Resp: 18   Filed Weights   06/04/15 0808  Weight: 249 lb (112.946 kg)   Body mass index is 37.87 kg/(m^2).   Physical Exam Constitutional: Appears well-developed and well-nourished. No distress.  Neck: Neck supple. No tracheal deviation present. No thyromegaly present.  No carotid bruit. No cervical adenopathy.   Cardiovascular: Normal rate, regular rhythm and normal heart sounds.   No murmur heard.  No edema Pulmonary/Chest: Effort normal and breath sounds normal. No respiratory distress. No wheezes.  Psych: normal mood and affect       Assessment & Plan:   Discussed weight loss in detail  Stressed continuing regular exercise Decrease caloric intake slightly Will check tsh  See Problem List for Assessment and Plan of chronic medical problems.

## 2015-06-04 NOTE — Patient Instructions (Addendum)
  We have reviewed your prior records including labs and tests today.  Test(s) ordered today. Your results will be released to [place] (or called to you) after review, usually within 72hours after test completion. If any changes need to be made, you will be notified at that same time.  All other Health Maintenance issues reviewed.   All recommended immunizations and age-appropriate screenings are up-to-date.  No immunizations administered today.   Medications reviewed and updated.  No changes recommended at this time.    Please followup annually

## 2015-06-04 NOTE — Assessment & Plan Note (Signed)
Numbness in first three fingers b/l Neck pain Will likely have surgery later this year On gabapentin and celebrex

## 2015-06-04 NOTE — Assessment & Plan Note (Signed)
Following with Dr. Trudie Reed Exercising - walking regularly

## 2015-06-04 NOTE — Assessment & Plan Note (Signed)
BP well controlled Continue current medication Continue to work on weight loss and exercise Hopefully we can decrease lisinopril after she loses weight Check cmp

## 2015-06-04 NOTE — Assessment & Plan Note (Signed)
Positive ANA, following with Dr Trudie Reed On plaquenil and celebrex Asked to have ANA rechecked - I will order it

## 2015-06-04 NOTE — Assessment & Plan Note (Signed)
Taken off medication last fall Difficulty losing weight Check tsh - will restart med if needed

## 2015-06-04 NOTE — Assessment & Plan Note (Signed)
Controlled Continue daily medication 

## 2015-06-04 NOTE — Assessment & Plan Note (Signed)
Family history of diabetes Check a1c Continue lifestyle changes

## 2015-06-04 NOTE — Progress Notes (Signed)
Pre visit review using our clinic review tool, if applicable. No additional management support is needed unless otherwise documented below in the visit note. 

## 2015-06-05 ENCOUNTER — Encounter: Payer: Self-pay | Admitting: Internal Medicine

## 2015-06-05 DIAGNOSIS — R7303 Prediabetes: Secondary | ICD-10-CM | POA: Insufficient documentation

## 2015-06-05 LAB — ANTI-NUCLEAR AB-TITER (ANA TITER)

## 2015-06-05 LAB — ANA: Anti Nuclear Antibody(ANA): POSITIVE — AB

## 2015-06-08 DIAGNOSIS — M064 Inflammatory polyarthropathy: Secondary | ICD-10-CM | POA: Diagnosis not present

## 2015-06-08 DIAGNOSIS — Z79899 Other long term (current) drug therapy: Secondary | ICD-10-CM | POA: Diagnosis not present

## 2015-06-08 DIAGNOSIS — M542 Cervicalgia: Secondary | ICD-10-CM | POA: Diagnosis not present

## 2015-06-08 DIAGNOSIS — M159 Polyosteoarthritis, unspecified: Secondary | ICD-10-CM | POA: Diagnosis not present

## 2015-06-08 DIAGNOSIS — M7712 Lateral epicondylitis, left elbow: Secondary | ICD-10-CM | POA: Diagnosis not present

## 2015-06-08 DIAGNOSIS — M2550 Pain in unspecified joint: Secondary | ICD-10-CM | POA: Diagnosis not present

## 2015-06-12 ENCOUNTER — Other Ambulatory Visit: Payer: Self-pay | Admitting: Internal Medicine

## 2015-06-12 ENCOUNTER — Telehealth: Payer: Self-pay | Admitting: Emergency Medicine

## 2015-06-12 MED ORDER — CITALOPRAM HYDROBROMIDE 20 MG PO TABS: 20.0000 mg | ORAL_TABLET | Freq: Every day | ORAL | Status: DC

## 2015-06-12 MED ORDER — VITAMIN B-12 1000 MCG PO TABS: 1000.0000 ug | ORAL_TABLET | Freq: Every day | ORAL | Status: DC

## 2015-06-12 NOTE — Telephone Encounter (Signed)
Please advise, do not see Synthroid on med list.

## 2015-06-16 DIAGNOSIS — Z9013 Acquired absence of bilateral breasts and nipples: Secondary | ICD-10-CM | POA: Diagnosis not present

## 2015-06-23 DIAGNOSIS — Z9013 Acquired absence of bilateral breasts and nipples: Secondary | ICD-10-CM | POA: Diagnosis not present

## 2015-06-24 ENCOUNTER — Encounter: Payer: Self-pay | Admitting: Internal Medicine

## 2015-06-24 ENCOUNTER — Ambulatory Visit (INDEPENDENT_AMBULATORY_CARE_PROVIDER_SITE_OTHER): Payer: PPO | Admitting: Internal Medicine

## 2015-06-24 VITALS — BP 132/88 | HR 81 | Temp 98.7°F | Resp 16 | Wt 244.0 lb

## 2015-06-24 DIAGNOSIS — M7712 Lateral epicondylitis, left elbow: Secondary | ICD-10-CM

## 2015-06-24 DIAGNOSIS — J0100 Acute maxillary sinusitis, unspecified: Secondary | ICD-10-CM

## 2015-06-24 MED ORDER — HYDROCODONE-HOMATROPINE 5-1.5 MG/5ML PO SYRP: 5.0000 mL | ORAL_SOLUTION | Freq: Three times a day (TID) | ORAL | Status: DC | PRN

## 2015-06-24 NOTE — Progress Notes (Signed)
Subjective:    Patient ID: [name], female    DOB: Jan 08, 1971, 45 y.o.   MRN: [id number]  HPI She is here for an acute visit for cold symptoms.   She started having cold symptoms just over one week ago. It does not seem to be getting better.  She has low grade fever, nasal congestion, pnd, sinus pressure that is mild, cough - mostly at night and fatigue.  She denies wheeze and sob.  She is not sleeping well due to the cough.  She is taking corcidan cold and cough.    Left elbow pain:  It started 2 weeks ago.  There was no activity that caused it. She has felt slight warmth to the touch in that area at times, but denies redness. There is no swelling.  She has tried a brace, but it does not seem to help much.   Medications and allergies reviewed with patient and updated if appropriate.  Patient Active Problem List   Diagnosis Date Noted  . Prediabetes 06/05/2015  . GERD (gastroesophageal reflux disease) 06/04/2015  . Seroma complicating a procedure, initial encounter 04/19/2015  . Hyperglycemia 09/22/2014  . Cervical disc disorder with radiculopathy of cervical region 05/23/2014  . Neck pain 02/03/2014  . Ulnar neuropathy 01/16/2014  . Fibromyalgia 01/16/2014  . 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 Arthralgia 04/29/2013  . Panic attacks 01/15/2013  . IBS (irritable bowel syndrome) 12/14/2011  . Premature menopause on hormone replacement therapy 09/02/2011  . B12 deficiency 06/30/2009  . Hypothyroidism 03/02/2007  . POLYCYSTIC OVARIAN DISEASE 03/02/2007  . Essential hypertension 03/02/2007    Current Outpatient Prescriptions on File Prior to Visit  Medication Sig Dispense Refill  . aspirin 81 MG tablet Take 81 mg by mouth daily.     . celecoxib (CELEBREX) 200 MG capsule Take 200 capsules by mouth 2 (two) times daily.     . cholecalciferol (VITAMIN D) 1000 UNITS tablet Take 1,000 Units by mouth daily.    . citalopram (CELEXA) 20 MG tablet Take 1 tablet (20 mg total)  by mouth daily. 90 tablet 3  . diazepam (VALIUM) 5 MG tablet Take 5 mg by mouth every 6 (six) hours as needed for anxiety.    . fish oil-omega-3 fatty acids 1000 MG capsule Take 1 g by mouth daily.    [name] gabapentin (NEURONTIN) 300 MG capsule Take 2 capsules (600 mg total) by mouth 3 (three) times daily. 180 capsule 11  . hydrochlorothiazide (MICROZIDE) 12.5 MG capsule Take 1 capsule (12.5 mg total) by mouth daily. 90 capsule 1  . hydroxychloroquine (PLAQUENIL) 200 MG tablet Take 400 mg by mouth daily.     [name] lisinopril (PRINIVIL,ZESTRIL) 40 MG tablet TAKE 1 TABLET EVERY DAY 90 tablet 1  . loratadine (CLARITIN) 10 MG tablet Take 10 mg by mouth.    . Lysine 500 MG CAPS Take by mouth.    . Multiple Vitamin (MULTIVITAMIN WITH MINERALS) TABS tablet Take 1 tablet by mouth daily.    [name] omeprazole (PRILOSEC) 40 MG capsule TAKE 1 CAPSULE EVERY MORNING 90 capsule 0  . vitamin B-12 (CYANOCOBALAMIN) 1000 MCG tablet Take 1 tablet (1,000 mcg total) by mouth daily. 90 tablet 3   No current facility-administered medications on file prior to visit.    Past Medical History  Diagnosis Date  . Hypertension     a  . Anxiety   . Depression     during divorce & legal matters  . Osteoarthritis   . GERD (  gastroesophageal reflux disease)   . IBS (irritable bowel syndrome)   . Lupus (HCC) hx positive ANA    followed by Dr Gavin Pound; possible lupus  . History of polycystic ovarian disease S/P BSO  . 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 Chronic joint pain   . Myalgia   . History of kidney stones     Dr Phebe Colla  . Hx of anxiety disorder   . Hypothyroidism     no meds  . PONV (postoperative nausea and vomiting)   . Symptomatic mammary hypertrophy   . URI (upper respiratory infection)     currently on cefdinir    Past Surgical History  Procedure Laterality Date  . Eye surgery      Retinal tears surgery bilateral  . Tonsillectomy    . Carpal tunnel release      bilateral; Dr Sherwood Gambler  . Plantar  fascia surgery      right  . Plantar fascia surgery  02/2011    left  . Cystoscopy w/ ureteral stent placement  11/12/2011    Procedure: CYSTOSCOPY WITH RETROGRADE PYELOGRAM/URETERAL STENT PLACEMENT;  Surgeon: Alexis Frock, MD;  Location: [facility];  Service: Urology;  Laterality: Right;  . Laparoscopic cholecystectomy  05-31-2007  . Cysto/ right retrograde ureteral pyelogram  07-09-2004    HX BILATERAL RENAL STONES/ RIGHT FLANK PAIN  . Excision left bartholin gland  02-05-2002  . Laparoscopy with right salpingo-oophectomy/ lysis adhesions and ablation endometriosis  05-17-2001  . Right ureteroscopic stone extraction  08-04-2000  . Laparoscopic laser ablation endometriosis and left salpingo-oophorectomy  11-03-1999  . Laparoscopic assisted vaginal hysterectomy  2000  . Dilation and curettage of uterus      X5  . Ankle surgery      X3  . Appendectomy  1991    exploratory lap  . Lumbar laminectomy  2003    L4 - L5  . Ureteral stent placement  11/12/11    Dr Tresa Moore  . Cystoscopy w/ ureteral stent removal  12/07/2011    Procedure: CYSTOSCOPY WITH STENT REMOVAL;  Surgeon: Alexis Frock, MD;  Location: [facility];  Service: Urology;  Laterality: Right;  . Cystoscopy/retrograde/ureteroscopy/stone extraction with basket  12/07/2011    Procedure: CYSTOSCOPY/RETROGRADE/URETEROSCOPY/STONE EXTRACTION WITH BASKET;  Surgeon: Alexis Frock, MD;  Location: [facility];  Service: Urology;  Laterality: Right;  . Colonoscopy      in 1990s  . Breast reduction surgery Bilateral 03/26/2015    Procedure: BILATERAL BREAST REDUCTION ;  Surgeon: Wallace Going, DO;  Location: [place];  Service: Plastics;  Laterality: Bilateral;  . Liposuction Bilateral 03/26/2015    Procedure: WITH LIPOSUCTION;  Surgeon: Wallace Going, DO;  Location: [place];  Service: Plastics;  Laterality: Bilateral;    Social History   Social History  . Marital  Status: Married    Spouse Name: Darnelle Maffucci  . Number of Children: 0  . Years of Education: college   Occupational History  . disabled    Social History Main Topics  . Smoking status: Never Smoker   . Smokeless tobacco: Never Used  . Alcohol Use: 0.6 oz/week    1 Shots of liquor per week     Comment:  very rarely  . Drug Use: No  . Sexual Activity: Yes    Birth Control/ Protection: Surgical   Other Topics Concern  . Not on file   Social History Narrative   Patient Lives at home with her husband (  Darnelle Maffucci)   Disabled.   Education two years of college.   Right handed.   Caffeine coffee and sweet tea. Not daily.    Family History  Problem Relation Age of Onset  . Hypertension Mother   . Colon polyps Mother   . Atrial fibrillation Mother     Dr Angelena Form  . Heart attack Father 29  . Diabetes Father   . Hypertension Sister   . Heart attack Paternal Grandmother 41  . Breast cancer Paternal Aunt   . Colon cancer Maternal Uncle   . Colon cancer Paternal Uncle   . Colon polyps Maternal Grandmother   . Stroke Maternal Grandmother 76  . Diabetes Paternal Grandfather   . Stroke Maternal Grandfather 37    Review of Systems  Constitutional: Positive for fever (low grade) and chills.  HENT: Positive for congestion (discolored mucus), postnasal drip, sinus pressure (mild) and sneezing. Negative for ear pain and sore throat.   Respiratory: Positive for cough (when laying down). Negative for shortness of breath and wheezing.   Cardiovascular: Negative for chest pain.  Gastrointestinal: Positive for diarrhea. Negative for nausea and abdominal pain.  Musculoskeletal: Negative for myalgias.  Neurological: Negative for dizziness, light-headedness and headaches.       Objective:   Filed Vitals:   06/24/15 1348  BP: 132/88  Pulse: 81  Temp: 98.7 F (37.1 C)  Resp: 16   Filed Weights   06/24/15 1348  Weight: 244 lb (110.678 kg)   Body mass index is 37.11 kg/(m^2).   Physical  Exam GENERAL APPEARANCE: Appears stated age, well appearing, NAD EYES: conjunctiva clear, no icterus HEENT: bilateral tympanic membranes and ear canals normal, oropharynx with mild erythema, no thyromegaly, trachea midline, no cervical or supraclavicular lymphadenopathy LUNGS: Clear to auscultation without wheeze or crackles, unlabored breathing, good air entry bilaterally HEART: Normal S1,S2 without murmurs EXTREMITIES: No edema, tenderness at lateral epicondyle, no swelling, redness or deformity, FROM of elbow, no joint swelling or effusion, normal sensation and strength      Assessment & Plan:   actue sinusitis: Likely viral in nature No need for an antibiotic Will give a cough syrup  - hycodan for nighttime She will use tessalon perles during the day Can continue corcidan  Call if no improvement   Left elbow Lateral epicondylitis Her symptoms are consistent with tendinitis Discussed rest, ice, using her elbow brace and doing the exercises Taking Celebrex daily She deferred further treatment at this time, but there is no improvement she will call

## 2015-06-24 NOTE — Patient Instructions (Addendum)
Your infection is likely viral.  Continue over the counter cold medication.  Take the prescription cough syrup as needed. Call if no improvement.    Lateral Epicondylitis With Rehab Lateral epicondylitis involves inflammation and pain around the outer portion of the elbow. The pain is caused by inflammation of the tendons in the forearm that bring back (extend) the wrist. Lateral epicondylitis is also called tennis elbow, because it is very common in tennis players. However, it may occur in any individual who extends the wrist repetitively. If lateral epicondylitis is left untreated, it may become a chronic problem. SYMPTOMS   Pain, tenderness, and inflammation on the outer (lateral) side of the elbow.  Pain or weakness with gripping activities.  Pain that increases with wrist-twisting motions (playing tennis, using a screwdriver, opening a door or a jar).  Pain with lifting objects, including a coffee cup. CAUSES  Lateral epicondylitis is caused by inflammation of the tendons that extend the wrist. Causes of injury may include:  Repetitive stress and strain on the muscles and tendons that extend the wrist.  Sudden change in activity level or intensity.  Incorrect grip in racquet sports.  Incorrect grip size of racquet (often too large).  Incorrect hitting position or technique (usually backhand, leading with the elbow).  Using a racket that is too heavy. RISK INCREASES WITH:  Sports or occupations that require repetitive and/or strenuous forearm and wrist movements (tennis, squash, racquetball, carpentry).  Poor wrist and forearm strength and flexibility.  Failure to warm up properly before activity.  Resuming activity before healing, rehabilitation, and conditioning are complete. PREVENTION   Warm up and stretch properly before activity.  Maintain physical fitness:  Strength, flexibility, and endurance.  Cardiovascular fitness.  Wear and use properly fitted  equipment.  Learn and use proper technique and have a coach correct improper technique.  Wear a tennis elbow (counterforce) brace. PROGNOSIS  The course of this condition depends on the degree of the injury. If treated properly, acute cases (symptoms lasting less than 4 weeks) are often resolved in 2 to 6 weeks. Chronic (longer lasting cases) often resolve in 3 to 6 months but may require physical therapy. RELATED COMPLICATIONS   Frequently recurring symptoms, resulting in a chronic problem. Properly treating the problem the first time decreases frequency of recurrence.  Chronic inflammation, scarring tendon degeneration, and partial tendon tear, requiring surgery.  Delayed healing or resolution of symptoms. TREATMENT  Treatment first involves the use of ice and medicine to reduce pain and inflammation. Strengthening and stretching exercises may help reduce discomfort if performed regularly. These exercises may be performed at home if the condition is an acute injury. Chronic cases may require a referral to a physical therapist for evaluation and treatment. Your caregiver may advise a corticosteroid injection to help reduce inflammation. Rarely, surgery is needed. MEDICATION  If pain medicine is needed, nonsteroidal anti-inflammatory medicines (aspirin and ibuprofen), or other minor pain relievers (acetaminophen), are often advised.  Do not take pain medicine for 7 days before surgery.  Prescription pain relievers may be given, if your caregiver thinks they are needed. Use only as directed and only as much as you need.  Corticosteroid injections may be recommended. These injections should be reserved only for the most severe cases, because they can only be given a certain number of times. HEAT AND COLD  Cold treatment (icing) should be applied for 10 to 15 minutes every 2 to 3 hours for inflammation and pain, and immediately after activity that aggravates  your symptoms. Use ice packs or an  ice massage.  Heat treatment may be used before performing stretching and strengthening activities prescribed by your caregiver, physical therapist, or athletic trainer. Use a heat pack or a warm water soak. SEEK MEDICAL CARE IF: Symptoms get worse or do not improve in 2 weeks, despite treatment. EXERCISES  RANGE OF MOTION (ROM) AND STRETCHING EXERCISES - Epicondylitis, Lateral (Tennis Elbow) These exercises may help you when beginning to rehabilitate your injury. Your symptoms may go away with or without further involvement from your physician, physical therapist, or athletic trainer. While completing these exercises, remember:   Restoring tissue flexibility helps normal motion to return to the joints. This allows healthier, less painful movement and activity.  An effective stretch should be held for at least 30 seconds.  A stretch should never be painful. You should only feel a gentle lengthening or release in the stretched tissue. RANGE OF MOTION - Wrist Flexion, Active-Assisted  Extend your right / left elbow with your fingers pointing down.*  Gently pull the back of your hand towards you, until you feel a gentle stretch on the top of your forearm.  Hold this position for __________ seconds. Repeat __________ times. Complete this exercise __________ times per day.  *If directed by your physician, physical therapist or athletic trainer, complete this stretch with your elbow bent, rather than extended. RANGE OF MOTION - Wrist Extension, Active-Assisted  Extend your right / left elbow and turn your palm upwards.*  Gently pull your palm and fingertips back, so your wrist extends and your fingers point more toward the ground.  You should feel a gentle stretch on the inside of your forearm.  Hold this position for __________ seconds. Repeat __________ times. Complete this exercise __________ times per day. *If directed by your physician, physical therapist or athletic trainer, complete  this stretch with your elbow bent, rather than extended. STRETCH - Wrist Flexion  Place the back of your right / left hand on a tabletop, leaving your elbow slightly bent. Your fingers should point away from your body.  Gently press the back of your hand down onto the table by straightening your elbow. You should feel a stretch on the top of your forearm.  Hold this position for __________ seconds. Repeat __________ times. Complete this stretch __________ times per day.  STRETCH - Wrist Extension   Place your right / left fingertips on a tabletop, leaving your elbow slightly bent. Your fingers should point backwards.  Gently press your fingers and palm down onto the table by straightening your elbow. You should feel a stretch on the inside of your forearm.  Hold this position for __________ seconds. Repeat __________ times. Complete this stretch __________ times per day.  STRENGTHENING EXERCISES - Epicondylitis, Lateral (Tennis Elbow) These exercises may help you when beginning to rehabilitate your injury. They may resolve your symptoms with or without further involvement from your physician, physical therapist, or athletic trainer. While completing these exercises, remember:   Muscles can gain both the endurance and the strength needed for everyday activities through controlled exercises.  Complete these exercises as instructed by your physician, physical therapist or athletic trainer. Increase the resistance and repetitions only as guided.  You may experience muscle soreness or fatigue, but the pain or discomfort you are trying to eliminate should never worsen during these exercises. If this pain does get worse, stop and make sure you are following the directions exactly. If the pain is still present after adjustments, discontinue  the exercise until you can discuss the trouble with your caregiver. STRENGTH - Wrist Flexors  Sit with your right / left forearm palm-up and fully supported on  a table or countertop. Your elbow should be resting below the height of your shoulder. Allow your wrist to extend over the edge of the surface.  Loosely holding a __________ weight, or a piece of rubber exercise band or tubing, slowly curl your hand up toward your forearm.  Hold this position for __________ seconds. Slowly lower the wrist back to the starting position in a controlled manner. Repeat __________ times. Complete this exercise __________ times per day.  STRENGTH - Wrist Extensors  Sit with your right / left forearm palm-down and fully supported on a table or countertop. Your elbow should be resting below the height of your shoulder. Allow your wrist to extend over the edge of the surface.  Loosely holding a __________ weight, or a piece of rubber exercise band or tubing, slowly curl your hand up toward your forearm.  Hold this position for __________ seconds. Slowly lower the wrist back to the starting position in a controlled manner. Repeat __________ times. Complete this exercise __________ times per day.  STRENGTH - Ulnar Deviators  Stand with a ____________________ weight in your right / left hand, or sit while holding a rubber exercise band or tubing, with your healthy arm supported on a table or countertop.  Move your wrist, so that your pinkie travels toward your forearm and your thumb moves away from your forearm.  Hold this position for __________ seconds and then slowly lower the wrist back to the starting position. Repeat __________ times. Complete this exercise __________ times per day STRENGTH - Radial Deviators  Stand with a ____________________ weight in your right / left hand, or sit while holding a rubber exercise band or tubing, with your injured arm supported on a table or countertop.  Raise your hand upward in front of you or pull up on the rubber tubing.  Hold this position for __________ seconds and then slowly lower the wrist back to the starting  position. Repeat __________ times. Complete this exercise __________ times per day. STRENGTH - Forearm Supinators   Sit with your right / left forearm supported on a table, keeping your elbow below shoulder height. Rest your hand over the edge, palm down.  Gently grip a hammer or a soup ladle.  Without moving your elbow, slowly turn your palm and hand upward to a "thumbs-up" position.  Hold this position for __________ seconds. Slowly return to the starting position. Repeat __________ times. Complete this exercise __________ times per day.  STRENGTH - Forearm Pronators   Sit with your right / left forearm supported on a table, keeping your elbow below shoulder height. Rest your hand over the edge, palm up.  Gently grip a hammer or a soup ladle.  Without moving your elbow, slowly turn your palm and hand upward to a "thumbs-up" position.  Hold this position for __________ seconds. Slowly return to the starting position. Repeat __________ times. Complete this exercise __________ times per day.  STRENGTH - Grip  Grasp a tennis ball, a dense sponge, or a large, rolled sock in your hand.  Squeeze as hard as you can, without increasing any pain.  Hold this position for __________ seconds. Release your grip slowly. Repeat __________ times. Complete this exercise __________ times per day.  STRENGTH - Elbow Extensors, Isometric  Stand or sit upright, on a firm surface. Place your right /  left arm so that your palm faces your stomach, and it is at the height of your waist.  Place your opposite hand on the underside of your forearm. Gently push up as your right / left arm resists. Push as hard as you can with both arms, without causing any pain or movement at your right / left elbow. Hold this stationary position for __________ seconds. Gradually release the tension in both arms. Allow your muscles to relax completely before repeating.   This information is not intended to replace advice  given to you by your health care provider. Make sure you discuss any questions you have with your health care provider.   Document Released: 03/21/2005 Document Revised: 04/11/2014 Document Reviewed: 07/03/2008 Elsevier Interactive Patient Education [employer].

## 2015-07-07 ENCOUNTER — Ambulatory Visit: Payer: PPO

## 2015-07-07 DIAGNOSIS — M542 Cervicalgia: Secondary | ICD-10-CM | POA: Diagnosis not present

## 2015-07-07 DIAGNOSIS — M50222 Other cervical disc displacement at C5-C6 level: Secondary | ICD-10-CM | POA: Diagnosis not present

## 2015-07-08 ENCOUNTER — Telehealth: Payer: Self-pay

## 2015-07-08 NOTE — Telephone Encounter (Signed)
Call to the patient 04/04 to discuss postponement of AWV which will not be due until May/ The patient is in process of trying to lose weight; Describes changes in diet and feels she is eating approx 1800 calories; has stopped sugary drinks etc. Discussed tracking every thing she eats for 3 to 5 days per myfitnesspal or calorie king to note ratio of carbs; protein, sodium; fat etc. She is tracking calories but not specific nutrients which may need to be tweaked to meet weight loss goals.  Will call back next week for follow up. Still to schedule AWV for may

## 2015-07-11 ENCOUNTER — Encounter: Payer: Self-pay | Admitting: Internal Medicine

## 2015-07-13 NOTE — Telephone Encounter (Signed)
[name] called this am; Did change her caloric intake to 1550 and has lost over 7lbs;  Wt now is tracked at 230 from 76; Continues to walk and track steps. The patient stated she was billed for cancellation of AWV; Will refer to manager to have billing withdrawn as last AWV was completed on 09/22/14; now noted that it was not billed. Can re-schedule visit

## 2015-07-14 NOTE — Telephone Encounter (Signed)
fup with [name] regarding AWV; Dr. Linna Darner did an AWV last year, but this visit was not billed; Prior AWV was deleted and now rescheduled for 4/26 at 11am. The patient will be held harmless for any incidental billing that may have occurred from the 1st AWV not being canceled. Mgr informed; billing corrrected Sh

## 2015-07-16 ENCOUNTER — Ambulatory Visit: Payer: PPO

## 2015-07-21 DIAGNOSIS — N62 Hypertrophy of breast: Secondary | ICD-10-CM | POA: Diagnosis not present

## 2015-07-21 DIAGNOSIS — Z9013 Acquired absence of bilateral breasts and nipples: Secondary | ICD-10-CM | POA: Diagnosis not present

## 2015-07-27 DIAGNOSIS — Z719 Counseling, unspecified: Secondary | ICD-10-CM | POA: Insufficient documentation

## 2015-07-29 ENCOUNTER — Ambulatory Visit (INDEPENDENT_AMBULATORY_CARE_PROVIDER_SITE_OTHER): Payer: PPO

## 2015-07-29 VITALS — BP 110/70 | Ht 68.0 in | Wt 236.0 lb

## 2015-07-29 DIAGNOSIS — Z0000 Encounter for general adult medical examination without abnormal findings: Secondary | ICD-10-CM | POA: Diagnosis not present

## 2015-07-29 NOTE — Patient Instructions (Addendum)
Ms. Virrueta , Thank you for taking time to come for your Medicare Wellness Visit. I appreciate your ongoing commitment to your health goals. Please review the following plan we discussed and let me know if I can assist you in the future.   Check out  online nutrition programs as GumSearch.nl and http://vang.com/; fit20m; sparkpeople  Look for foods with "whole" wheat; bran; oatmeal etc Watch for "hydrogenated" on the label of oils which are trans-fats.  Meats have less marbling; bright colored fruits and vegetables;  Canned; dump out liquid and wash vegetables. Be mindful of what we are eating  Sit down; take a break and enjoy your meal; take smaller bites; put the fork down between bites;  It takes 20 minutes to get full; so check in with your fullness cues and stop eating when you start to fill full   Will see Dr. BQuay Burowif IBS becomes a problem  [place]  These are the goals we discussed: Goals    . Weight < 190 lb (86.183 kg)     Will continue to lose weight and eat well  http://healthyeating.sfgate.com/recommended-grams-nutrients-per-day-healthy-weight-loss-6294.html Carbs provide 4 calories per gram. Therefore, you'd need 135 to 195 grams of carbohydrates when consuming a 1,200-calorie diet, and 180 to 260 grams of carbs when following a 1,600-calorie weight loss plan.   Protein contains 4 calories per gram. Many well-balanced, reduced-calorie diets contain about 20 percent protein, which is equivalent to 60 grams of protein for a 1,200-calorie diet and 80 grams of protein when following a 1,600-calorie weight loss plan.   The [employer] encourages adults to consume 20 to 35 percent of their total calories from fat. If you consume 50 percent of your energy intake from carbs and 20 percent from protein, aim to eat 30 percent of your calories from fat, which is equivalent to 40 grams when following a 1,200-calorie weight loss diet and 53 grams of  fat when consuming 1,600 calories per day.     . 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 BMI is an estimate of body fat and a good gauge of your risk for diseases that can occur with more body fat. The higher your BMI, the more risk you have for heart disease, high blood pressure, type 2 diabetes, gallstones, breathing problems and certain cancers.   A high BMI in conjunction with other risk put you at greater risk for heart disease and other conditions Risk include High Blood pressure High LDL  (bad cholesterol) Low HDL (good cholesterol) High Triglycerides High Blood Glucose Family hx of premature heart disease Physical Inactivity  Cigarette smoking  Even a small weight loss will help lower your risk of developing disease associated with obesity.  Your goal today is based on decreasing one of these risk.         This is a list of the screening recommended for you and due dates:  Health Maintenance  Topic Date Due  . Flu Shot  11/03/2015  . Tetanus Vaccine  08/15/2022  . HIV Screening  Completed     Fat and Cholesterol Restricted Diet Getting too much fat and cholesterol in your diet  may cause health problems. Following this diet helps keep your fat and cholesterol at normal levels. This can keep you from getting sick. WHAT TYPES OF FAT SHOULD I CHOOSE?  Choose monosaturated and polyunsaturated fats. These are found in foods such as olive oil, canola oil, flaxseeds, walnuts, almonds, and seeds.  Eat more omega-3 fats. Good choices include salmon, mackerel, sardines, tuna, flaxseed oil, and ground flaxseeds.  Limit saturated fats. These are in animal products such as meats, butter, and cream. They can also be in plant  products such as palm oil, palm kernel oil, and coconut oil.   Avoid foods with partially hydrogenated oils in them. These contain trans fats. Examples of foods that have trans fats are stick margarine, some tub margarines, cookies, crackers, and other baked goods. WHAT GENERAL GUIDELINES DO I NEED TO FOLLOW?   Check food labels. Look for the words "trans fat" and "saturated fat."  When preparing a meal:  Fill half of your plate with vegetables and green salads.  Fill one fourth of your plate with whole grains. Look for the word "whole" as the first word in the ingredient list.  Fill one fourth of your plate with lean protein foods.  Limit fruit to two servings a day. Choose fruit instead of juice.  Eat more foods with soluble fiber. Examples of foods with this type of fiber are apples, broccoli, carrots, beans, peas, and barley. Try to get 20-30 g (grams) of fiber per day.  Eat more home-cooked foods. Eat less at restaurants and buffets.  Limit or avoid alcohol.  Limit foods high in starch and sugar.  Limit fried foods.  Cook foods without frying them. Baking, boiling, grilling, and broiling are all great options.  Lose weight if you are overweight. Losing even a small amount of weight can help your overall health. It can also help prevent diseases such as diabetes and heart disease. WHAT FOODS CAN I EAT? Grains Whole grains, such as whole wheat or whole grain breads, crackers, cereals, and pasta. Unsweetened oatmeal, bulgur, barley, quinoa, or brown rice. Corn or whole wheat flour tortillas. Vegetables Fresh or frozen vegetables (raw, steamed, roasted, or grilled). Green salads. Fruits All fresh, canned (in natural juice), or frozen fruits. Meat and Other Protein Products Ground beef (85% or leaner), grass-fed beef, or beef trimmed of fat. Skinless chicken or [place]. Ground chicken or [place]. Pork trimmed of fat. All fish and seafood. Eggs. Dried beans, peas, or lentils.  Unsalted nuts or seeds. Unsalted canned or dry beans. Dairy Low-fat dairy products, such as skim or 1% milk, 2% or reduced-fat cheeses, low-fat ricotta or cottage cheese, or plain low-fat yogurt. Fats and Oils Tub margarines without trans fats. Light or reduced-fat mayonnaise and salad dressings. Avocado. Olive, canola, sesame, or safflower oils. Natural peanut or almond butter (choose ones without added sugar and oil). The items listed above may not be a complete list of recommended foods or beverages. Contact your dietitian for more options. WHAT FOODS ARE NOT RECOMMENDED? Grains White bread. White pasta. White rice. Cornbread. Bagels, pastries, and croissants. Crackers that contain trans fat. Vegetables White potatoes. Corn. Creamed or fried vegetables. Vegetables in a cheese sauce. Fruits Dried fruits. Canned fruit in light or heavy syrup. Fruit juice. Meat and Other Protein Products Fatty cuts of meat. Ribs, chicken wings, bacon, sausage, bologna, salami, chitterlings, fatback, hot dogs, bratwurst, and packaged luncheon meats. Liver and organ meats. Dairy Whole or 2% milk, cream, half-and-half, and cream cheese. Whole milk cheeses. Whole-fat  or sweetened yogurt. Full-fat cheeses. Nondairy creamers and whipped toppings. Processed cheese, cheese spreads, or cheese curds. Sweets and Desserts Corn syrup, sugars, honey, and molasses. Candy. Jam and jelly. Syrup. Sweetened cereals. Cookies, pies, cakes, donuts, muffins, and ice cream. Fats and Oils Butter, stick margarine, lard, shortening, ghee, or bacon fat. Coconut, palm kernel, or palm oils. Beverages Alcohol. Sweetened drinks (such as sodas, lemonade, and fruit drinks or punches). The items listed above may not be a complete list of foods and beverages to avoid. Contact your dietitian for more information.   This information is not intended to replace advice given to you by your health care provider. Make sure you discuss any questions  you have with your health care provider.   Document Released: 09/20/2011 Document Revised: 04/11/2014 Document Reviewed: 06/20/2013 Elsevier Interactive Patient Education 2016 [place] in the Home  Falls can cause injuries. They can happen to people of all ages. There are many things you can do to make your home safe and to help prevent falls.  WHAT CAN I DO ON THE OUTSIDE OF MY HOME?  Regularly fix the edges of walkways and driveways and fix any cracks.  Remove anything that might make you trip as you walk through a door, such as a raised step or threshold.  Trim any bushes or trees on the path to your home.  Use bright outdoor lighting.  Clear any walking paths of anything that might make someone trip, such as rocks or tools.  Regularly check to see if handrails are loose or broken. Make sure that both sides of any steps have handrails.  Any raised decks and porches should have guardrails on the edges.  Have any leaves, snow, or ice cleared regularly.  Use sand or salt on walking paths during winter.  Clean up any spills in your garage right away. This includes oil or grease spills. WHAT CAN I DO IN THE BATHROOM?   Use night lights.  Install grab bars by the toilet and in the tub and shower. Do not use towel bars as grab bars.  Use non-skid mats or decals in the tub or shower.  If you need to sit down in the shower, use a plastic, non-slip stool.  Keep the floor dry. Clean up any water that spills on the floor as soon as it happens.  Remove soap buildup in the tub or shower regularly.  Attach bath mats securely with double-sided non-slip rug tape.  Do not have throw rugs and other things on the floor that can make you trip. WHAT CAN I DO IN THE BEDROOM?  Use night lights.  Make sure that you have a light by your bed that is easy to reach.  Do not use any sheets or blankets that are too big for your bed. They should not hang down onto the  floor.  Have a firm chair that has side arms. You can use this for support while you get dressed.  Do not have throw rugs and other things on the floor that can make you trip. WHAT CAN I DO IN THE KITCHEN?  Clean up any spills right away.  Avoid walking on wet floors.  Keep items that you use a lot in easy-to-reach places.  If you need to reach something above you, use a strong step stool that has a grab bar.  Keep electrical cords out of the way.  Do not use floor polish or wax that makes floors slippery. If  you must use wax, use non-skid floor wax.  Do not have throw rugs and other things on the floor that can make you trip. WHAT CAN I DO WITH MY STAIRS?  Do not leave any items on the stairs.  Make sure that there are handrails on both sides of the stairs and use them. Fix handrails that are broken or loose. Make sure that handrails are as long as the stairways.  Check any carpeting to make sure that it is firmly attached to the stairs. Fix any carpet that is loose or worn.  Avoid having throw rugs at the top or bottom of the stairs. If you do have throw rugs, attach them to the floor with carpet tape.  Make sure that you have a light switch at the top of the stairs and the bottom of the stairs. If you do not have them, ask someone to add them for you. WHAT ELSE CAN I DO TO HELP PREVENT FALLS?  Wear shoes that:  Do not have high heels.  Have rubber bottoms.  Are comfortable and fit you well.  Are closed at the toe. Do not wear sandals.  If you use a stepladder:  Make sure that it is fully opened. Do not climb a closed stepladder.  Make sure that both sides of the stepladder are locked into place.  Ask someone to hold it for you, if possible.  Clearly mark and make sure that you can see:  Any grab bars or handrails.  First and last steps.  Where the edge of each step is.  Use tools that help you move around (mobility aids) if they are needed. These  include:  Canes.  Walkers.  Scooters.  Crutches.  Turn on the lights when you go into a dark area. Replace any light bulbs as soon as they burn out.  Set up your furniture so you have a clear path. Avoid moving your furniture around.  If any of your floors are uneven, fix them.  If there are any pets around you, be aware of where they are.  Review your medicines with your doctor. Some medicines can make you feel dizzy. This can increase your chance of falling. Ask your doctor what other things that you can do to help prevent falls.   This information is not intended to replace advice given to you by your health care provider. Make sure you discuss any questions you have with your health care provider.   Document Released: 01/15/2009 Document Revised: 08/05/2014 Document Reviewed: 04/25/2014 Elsevier Interactive Patient Education 2016 [place] Maintenance, Female Adopting a healthy lifestyle and getting preventive care can go a long way to promote health and wellness. Talk with your health care provider about what schedule of regular examinations is right for you. This is a good chance for you to check in with your provider about disease prevention and staying healthy. In between checkups, there are plenty of things you can do on your own. Experts have done a lot of research about which lifestyle changes and preventive measures are most likely to keep you healthy. Ask your health care provider for more information. WEIGHT AND DIET  Eat a healthy diet  Be sure to include plenty of vegetables, fruits, low-fat dairy products, and lean protein.  Do not eat a lot of foods high in solid fats, added sugars, or salt.  Get regular exercise. This is one of the most important things you can do for your health.  Most  adults should exercise for at least 150 minutes each week. The exercise should increase your heart rate and make you sweat (moderate-intensity exercise).  Most  adults should also do strengthening exercises at least twice a week. This is in addition to the moderate-intensity exercise.  Maintain a healthy weight  Body mass index (BMI) is a measurement that can be used to identify possible weight problems. It estimates body fat based on height and weight. Your health care provider can help determine your BMI and help you achieve or maintain a healthy weight.  For females 90 years of age and older:   A BMI below 18.5 is considered underweight.  A BMI of 18.5 to 24.9 is normal.  A BMI of 25 to 29.9 is considered overweight.  A BMI of 30 and above is considered obese.  Watch levels of cholesterol and blood lipids  You should start having your blood tested for lipids and cholesterol at 46 years of age, then have this test every 5 years.  You may need to have your cholesterol levels checked more often if:  Your lipid or cholesterol levels are high.  You are older than 46 years of age.  You are at high risk for heart disease.  CANCER SCREENING   Lung Cancer  Lung cancer screening is recommended for adults 42-45 years old who are at high risk for lung cancer because of a history of smoking.  A yearly low-dose CT scan of the lungs is recommended for people who:  Currently smoke.  Have quit within the past 15 years.  Have at least a 30-pack-year history of smoking. A pack year is smoking an average of one pack of cigarettes a day for 1 year.  Yearly screening should continue until it has been 15 years since you quit.  Yearly screening should stop if you develop a health problem that would prevent you from having lung cancer treatment.  Breast Cancer  Practice breast self-awareness. This means understanding how your breasts normally appear and feel.  It also means doing regular breast self-exams. Let your health care provider know about any changes, no matter how small.  If you are in your 20s or 30s, you should have a clinical  breast exam (CBE) by a health care provider every 1-3 years as part of a regular health exam.  If you are 75 or older, have a CBE every year. Also consider having a breast X-ray (mammogram) every year.  If you have a family history of breast cancer, talk to your health care provider about genetic screening.  If you are at high risk for breast cancer, talk to your health care provider about having an MRI and a mammogram every year.  Breast cancer gene (BRCA) assessment is recommended for women who have family members with BRCA-related cancers. BRCA-related cancers include:  Breast.  Ovarian.  Tubal.  Peritoneal cancers.  Results of the assessment will determine the need for genetic counseling and BRCA1 and BRCA2 testing. Cervical Cancer Your health care provider may recommend that you be screened regularly for cancer of the pelvic organs (ovaries, uterus, and vagina). This screening involves a pelvic examination, including checking for microscopic changes to the surface of your cervix (Pap test). You may be encouraged to have this screening done every 3 years, beginning at age 67.  For women ages 29-65, health care providers may recommend pelvic exams and Pap testing every 3 years, or they may recommend the Pap and pelvic exam, combined with testing for  human papilloma virus (HPV), every 5 years. Some types of HPV increase your risk of cervical cancer. Testing for HPV may also be done on women of any age with unclear Pap test results.  Other health care providers may not recommend any screening for nonpregnant women who are considered low risk for pelvic cancer and who do not have symptoms. Ask your health care provider if a screening pelvic exam is right for you.  If you have had past treatment for cervical cancer or a condition that could lead to cancer, you need Pap tests and screening for cancer for at least 20 years after your treatment. If Pap tests have been discontinued, your risk  factors (such as having a new sexual partner) need to be reassessed to determine if screening should resume. Some women have medical problems that increase the chance of getting cervical cancer. In these cases, your health care provider may recommend more frequent screening and Pap tests. Colorectal Cancer  This type of cancer can be detected and often prevented.  Routine colorectal cancer screening usually begins at 46 years of age and continues through 46 years of age.  Your health care provider may recommend screening at an earlier age if you have risk factors for colon cancer.  Your health care provider may also recommend using home test kits to check for hidden blood in the stool.  A small camera at the end of a tube can be used to examine your colon directly (sigmoidoscopy or colonoscopy). This is done to check for the earliest forms of colorectal cancer.  Routine screening usually begins at age 70.  Direct examination of the colon should be repeated every 5-10 years through 46 years of age. However, you may need to be screened more often if early forms of precancerous polyps or small growths are found. Skin Cancer  Check your skin from head to toe regularly.  Tell your health care provider about any new moles or changes in moles, especially if there is a change in a mole's shape or color.  Also tell your health care provider if you have a mole that is larger than the size of a pencil eraser.  Always use sunscreen. Apply sunscreen liberally and repeatedly throughout the day.  Protect yourself by wearing long sleeves, pants, a wide-brimmed hat, and sunglasses whenever you are outside. HEART DISEASE, DIABETES, AND HIGH BLOOD PRESSURE   High blood pressure causes heart disease and increases the risk of stroke. High blood pressure is more likely to develop in:  People who have blood pressure in the high end of the normal range (130-139/85-89 mm Hg).  People who are overweight or  obese.  People who are African American.  If you are 34-22 years of age, have your blood pressure checked every 3-5 years. If you are 61 years of age or older, have your blood pressure checked every year. You should have your blood pressure measured twice--once when you are at a hospital or clinic, and once when you are not at a hospital or clinic. Record the average of the two measurements. To check your blood pressure when you are not at a hospital or clinic, you can use:  An automated blood pressure machine at a pharmacy.  A home blood pressure monitor.  If you are between 93 years and 77 years old, ask your health care provider if you should take aspirin to prevent strokes.  Have regular diabetes screenings. This involves taking a blood sample to check your fasting  blood sugar level.  If you are at a normal weight and have a low risk for diabetes, have this test once every three years after 46 years of age.  If you are overweight and have a high risk for diabetes, consider being tested at a younger age or more often. PREVENTING INFECTION  Hepatitis B  If you have a higher risk for hepatitis B, you should be screened for this virus. You are considered at high risk for hepatitis B if:  You were born in a country where hepatitis B is common. Ask your health care provider which countries are considered high risk.  Your parents were born in a high-risk country, and you have not been immunized against hepatitis B (hepatitis B vaccine).  You have HIV or AIDS.  You use needles to inject street drugs.  You live with someone who has hepatitis B.  You have had sex with someone who has hepatitis B.  You get hemodialysis treatment.  You take certain medicines for conditions, including cancer, organ transplantation, and autoimmune conditions. Hepatitis C  Blood testing is recommended for:  Everyone born from [birth year] through 1966.  Anyone with known risk factors for hepatitis  C. Sexually transmitted infections (STIs)  You should be screened for sexually transmitted infections (STIs) including gonorrhea and chlamydia if:  You are sexually active and are younger than 46 years of age.  You are older than 46 years of age and your health care provider tells you that you are at risk for this type of infection.  Your sexual activity has changed since you were last screened and you are at an increased risk for chlamydia or gonorrhea. Ask your health care provider if you are at risk.  If you do not have HIV, but are at risk, it may be recommended that you take a prescription medicine daily to prevent HIV infection. This is called pre-exposure prophylaxis (PrEP). You are considered at risk if:  You are sexually active and do not regularly use condoms or know the HIV status of your partner(s).  You take drugs by injection.  You are sexually active with a partner who has HIV. Talk with your health care provider about whether you are at high risk of being infected with HIV. If you choose to begin PrEP, you should first be tested for HIV. You should then be tested every 3 months for as long as you are taking PrEP.  PREGNANCY   If you are premenopausal and you may become pregnant, ask your health care provider about preconception counseling.  If you may become pregnant, take 400 to 800 micrograms (mcg) of folic acid every day.  If you want to prevent pregnancy, talk to your health care provider about birth control (contraception). OSTEOPOROSIS AND MENOPAUSE   Osteoporosis is a disease in which the bones lose minerals and strength with aging. This can result in serious bone fractures. Your risk for osteoporosis can be identified using a bone density scan.  If you are 22 years of age or older, or if you are at risk for osteoporosis and fractures, ask your health care provider if you should be screened.  Ask your health care provider whether you should take a calcium or  vitamin D supplement to lower your risk for osteoporosis.  Menopause may have certain physical symptoms and risks.  Hormone replacement therapy may reduce some of these symptoms and risks. Talk to your health care provider about whether hormone replacement therapy is right for you.  HOME CARE INSTRUCTIONS   Schedule regular health, dental, and eye exams.  Stay current with your immunizations.   Do not use any tobacco products including cigarettes, chewing tobacco, or electronic cigarettes.  If you are pregnant, do not drink alcohol.  If you are breastfeeding, limit how much and how often you drink alcohol.  Limit alcohol intake to no more than 1 drink per day for nonpregnant women. One drink equals 12 ounces of beer, 5 ounces of wine, or 1 ounces of hard liquor.  Do not use street drugs.  Do not share needles.  Ask your health care provider for help if you need support or information about quitting drugs.  Tell your health care provider if you often feel depressed.  Tell your health care provider if you have ever been abused or do not feel safe at home.   This information is not intended to replace advice given to you by your health care provider. Make sure you discuss any questions you have with your health care provider.   Document Released: 10/04/2010 Document Revised: 04/11/2014 Document Reviewed: 02/20/2013 Elsevier Interactive Patient Education [employer].

## 2015-07-29 NOTE — Progress Notes (Addendum)
Subjective:   [name] is a 45 y.o. female who presents for Medicare Annual (Subsequent) preventive examination.  Review of Systems:   Cardiac Risk Factors include: family history of premature cardiovascular disease;obesity (BMI >30kg/m2) HRA assessment completed during visit; [name]  The Patient was informed that this wellness visit is to identify risk and educate on how to reduce risk for increase disease through lifestyle changes.   ROS deferred to CPE exam with physician  Family and medical hx given below; Family Hx; HOH; heart disease; DM; stroke Her father had a myocardial infarction and mini stroke at 36.   Lifestyle review:  Social history: Just married x 9 months ago HTN Polycystic ovarian disease/ s/p BSO Fibro  Lipids 3/2 2017; chol 162; trig 102; HDL 50 LDL 92 / reviewed and educated on all labs; A1c 5.9 but glucose normal; Diet is healthy; Cutting back on sweets to meet health loss goals    Tobacco Never smoked  ETOH: 6oz a week   Medication review/ Adherent;   BMI: Wt 244 to 236 and was 231 on scale at home;  Breast reduction surgery completed; lost 7 lbs;  Monday she was 232.2 on her scale;  started in March 8th 246.9  Almost lost 14.7 lbs;  Spouse and her both are working on weight loss;   Diet;   Has protein shake with iced coffee combined in the am; 18 grams of protein Watching calories; eating very health carbs; Taking Vitamins for women; Calories 1550 per day;   Exercise;   Cardio walking; states she loves swimming; will discuss with [name] prior to going. Can also try walking in pool if she can't swim. To see [name] to schedule for possible surgery on neck soon.  [facility] [place]; Discussed the benefits of swimming if doctor recommends    Eleva; one level  No functional losses unless she has flare up.  OA; pain; sometimes a lot of pain. Gabapentin helps  Falls; has fallen x 2; just walking inside; hurt  knee and back started hurting; some OA of back; sharp pain in sciatic; may be knee;   Community safety; YES Smoke detectors yes Firearms safety reviewed and will keep in a safe place if these exist. Driving accidents; no  Advised to use sun but cannot be in the sun/ did dx lupus    Depression: Denies feeling depressed or hopeless; voices pleasure in daily life Mood stable;   Cognitive; Presents with no issues;  Engaged in assessment Manages checkbook, medications; no failures of task Ad8 score reviewed for issues; 0   Fall assessment; x 2 and not sure why;  Gait assessment appears normal;   Sleep pattern changes; good   Urinary or fecal incontinence reviewed/ some IBS    Counseling Health Maintenance Colonoscopy; 06/1988; has not had another ; discussed colonoscopy at 42 in 5 years unless issues develop sooner  EKG: 03/2015  Mammogram 12/2012/12/2014; once a year; [name]; at her office [employer]; had one; ok; early hyst 27  PAP; q 2 years at GYN   Hearing: ok; audiology; can't hear well with background noise.   Ophthalmology exam; every 6 months;  Plaquenil vision checks q year   Immunizations Due none due  Advanced Directive; No; have given information for her to review   Health Recommendations and Referrals Will continue to watch nutrients (carbs; fat and protein) Discussed changing food [name] from day to day; eating bars for meals replacement every  other day. Choosing quinoa; salads; wraps; learning much about health eating and committed to losing weight.    Current Care Team reviewed and updated      Objective:     Vitals: BP 110/70 mmHg  Ht '5\' 8"'  (1.727 m)  Wt 236 lb (107.049 kg)  BMI 35.89 kg/m2  Body mass index is 35.89 kg/(m^2).   Tobacco History  Smoking status  . Never Smoker   Smokeless tobacco  . Never Used     Counseling given: Not Answered   Past Medical History  Diagnosis Date  . Hypertension     a  . Anxiety   . Depression      during divorce & legal matters  . Osteoarthritis   . GERD (gastroesophageal reflux disease)   . IBS (irritable bowel syndrome)   . Lupus (HCC) hx positive ANA    followed by [name]; possible lupus  . History of polycystic ovarian disease S/P BSO  . 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 Chronic joint pain   . Myalgia   . History of kidney stones     [name]  . Hx of anxiety disorder   . Hypothyroidism     no meds  . PONV (postoperative nausea and vomiting)   . Symptomatic mammary hypertrophy   . URI (upper respiratory infection)     currently on cefdinir   Past Surgical History  Procedure Laterality Date  . Eye surgery      Retinal tears surgery bilateral  . Tonsillectomy    . Carpal tunnel release      bilateral; [name]  . Plantar fascia surgery      right  . Plantar fascia surgery  02/2011    left  . Cystoscopy w/ ureteral stent placement  11/12/2011    Procedure: CYSTOSCOPY WITH RETROGRADE PYELOGRAM/URETERAL STENT PLACEMENT;  Surgeon: Frances Frock, MD;  Location: [facility];  Service: Urology;  Laterality: Right;  . Laparoscopic cholecystectomy  05-31-2007  . Cysto/ right retrograde ureteral pyelogram  07-09-2004    HX BILATERAL RENAL STONES/ RIGHT FLANK PAIN  . Excision left bartholin gland  02-05-2002  . Laparoscopy with right salpingo-oophectomy/ lysis adhesions and ablation endometriosis  05-17-2001  . Right ureteroscopic stone extraction  08-04-2000  . Laparoscopic laser ablation endometriosis and left salpingo-oophorectomy  11-03-1999  . Laparoscopic assisted vaginal hysterectomy  2000  . Dilation and curettage of uterus      X5  . Ankle surgery      X3  . Appendectomy  1991    exploratory lap  . Lumbar laminectomy  2003    L4 - L5  . Ureteral stent placement  11/12/11    Frances Tresa Moore  . Cystoscopy w/ ureteral stent removal  12/07/2011    Procedure: CYSTOSCOPY WITH STENT REMOVAL;  Surgeon: Frances Frock, MD;  [facility];   Service: Urology;  Laterality: Right;  . Cystoscopy/retrograde/ureteroscopy/stone extraction with basket  12/07/2011    Procedure: CYSTOSCOPY/RETROGRADE/URETEROSCOPY/STONE EXTRACTION WITH BASKET;  Surgeon: Frances Frock, MD;  Location: [facility];  Service: Urology;  Laterality: Right;  . Colonoscopy      in 1990s  . Breast reduction surgery Bilateral 03/26/2015    Procedure: BILATERAL BREAST REDUCTION ;  Surgeon: Wallace Going, DO;  Location: [facility];  Service: Plastics;  Laterality: Bilateral;  . Liposuction Bilateral 03/26/2015    Procedure: WITH LIPOSUCTION;  Surgeon: Wallace Going, DO;  Location: [facility];  Service:  Plastics;  Laterality: Bilateral;   Family History  Problem Relation Age of Onset  . Hypertension Mother   . Colon polyps Mother   . Atrial fibrillation Mother     Frances Angelena Form  . Heart attack Father 63  . Diabetes Father   . Hypertension Sister   . Heart attack Paternal Grandmother 28  . Breast cancer Paternal Aunt   . Colon cancer Maternal Uncle   . Colon cancer Paternal Uncle   . Colon polyps Maternal Grandmother   . Stroke Maternal Grandmother 76  . Diabetes Paternal Grandfather   . Stroke Maternal Grandfather 37   History  Sexual Activity  . Sexual Activity: Yes  . Birth Control/ Protection: Surgical    Outpatient Encounter Prescriptions as of 07/29/2015  Medication Sig  . aspirin 81 MG tablet Take 81 mg by mouth daily.   . celecoxib (CELEBREX) 200 MG capsule Take 200 capsules by mouth 2 (two) times daily.   . cholecalciferol (VITAMIN D) 1000 UNITS tablet Take 1,000 Units by mouth daily.  . citalopram (CELEXA) 20 MG tablet Take 1 tablet (20 mg total) by mouth daily.  . diazepam (VALIUM) 5 MG tablet Take 5 mg by mouth every 6 (six) hours as needed for anxiety.  . fish oil-omega-3 fatty acids 1000 MG capsule Take 1 g by mouth daily.  [name] gabapentin (NEURONTIN) 300 MG capsule Take 2 capsules (600  mg total) by mouth 3 (three) times daily.  . hydrochlorothiazide (MICROZIDE) 12.5 MG capsule Take 1 capsule (12.5 mg total) by mouth daily.  [name] HYDROcodone-homatropine (HYCODAN) 5-1.5 MG/5ML syrup Take 5 mLs by mouth every 8 (eight) hours as needed for cough.  . hydroxychloroquine (PLAQUENIL) 200 MG tablet Take 400 mg by mouth daily.   [name] lisinopril (PRINIVIL,ZESTRIL) 40 MG tablet TAKE 1 TABLET EVERY DAY  . loratadine (CLARITIN) 10 MG tablet Take 10 mg by mouth.  . Multiple Vitamin (MULTIVITAMIN WITH MINERALS) TABS tablet Take 1 tablet by mouth daily.  [name] omeprazole (PRILOSEC) 40 MG capsule TAKE 1 CAPSULE EVERY MORNING  . Probiotic Product (PROBIOTIC FORMULA PO) Take by mouth.  . vitamin B-12 (CYANOCOBALAMIN) 1000 MCG tablet Take 1 tablet (1,000 mcg total) by mouth daily.  [name] Lysine 500 MG CAPS Take by mouth. Reported on 07/29/2015   No facility-administered encounter medications on file as of 07/29/2015.    Activities of Daily Living In your present state of health, do you have any difficulty performing the following activities: 07/29/2015 03/26/2015  Hearing? (No Data) N  Vision? N N  Difficulty concentrating or making decisions? N N  Walking or climbing stairs? N N  Dressing or bathing? N N  Doing errands, shopping? N -  Preparing Food and eating ? N -  Using the Toilet? N -  In the past six months, have you accidently leaked urine? N -  Do you have problems with loss of bowel control? (No Data) -  Managing your Medications? N -  Managing your Finances? N -  Housekeeping or managing your Housekeeping? N -    Patient Care Team: Binnie Rail, MD as PCP - General (Internal Medicine) Gavin Pound, MD as Consulting Physician (Rheumatology)    Assessment:   Exercise Activities and Dietary recommendations Current Exercise Habits: Structured exercise class, Exercise limited by: Other - see comments  Goals    . Exercise 150 minutes per week (moderate activity)     Loves to swim; will  start doing laps; 30 laps is a mile; start as tolerated  and build in time     . Weight < 190 lb (86.183 kg)     Will continue to lose weight and eat well  http://healthyeating.sfgate.com/recommended-grams-nutrients-per-day-healthy-weight-loss-6294.html Carbs provide 4 calories per gram. Therefore, you'd need 135 to 195 grams of carbohydrates when consuming a 1,200-calorie diet, and 180 to 260 grams of carbs when following a 1,600-calorie weight loss [name].   Protein contains 4 calories per gram. Many well-balanced, reduced-calorie diets contain about 20 percent protein, which is equivalent to 60 grams of protein for a 1,200-calorie diet and 80 grams of protein when following a 1,600-calorie weight loss [name].   The Institute of Medicine encourages adults to consume 20 to 35 percent of their total calories from fat. If you consume 50 percent of your energy intake from carbs and 20 percent from protein, aim to eat 30 percent of your calories from fat, which is equivalent to 40 grams when following a 1,200-calorie weight loss diet and 53 grams of fat when consuming 1,600 calories per day.     . 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 BMI is an estimate of body fat and a good gauge of your risk for diseases that can occur with more body fat. The higher your BMI, the more risk you have for heart disease, high blood pressure, type 2 diabetes, gallstones, breathing problems and certain cancers.   A high BMI in conjunction with other risk put you at greater risk for heart disease and other conditions Risk include High Blood pressure High LDL  (bad cholesterol) Low HDL (good cholesterol) High Triglycerides High Blood Glucose Family hx of premature heart  disease Physical Inactivity  Cigarette smoking  Even a small weight loss will help lower your risk of developing disease associated with obesity.  Your goal today is based on decreasing one of these risk.        Fall Risk Fall Risk  07/29/2015 09/22/2014  Falls in the past year? Yes No  Number falls in past yr: 2 or more -  Injury with Fall? No -  Follow up Education provided -   Depression Screen PHQ 2/9 Scores 07/29/2015 09/22/2014  PHQ - 2 Score 0 0     Cognitive Testing MMSE - Mini Mental State Exam 09/22/2014  Not completed: Unable to complete    Immunization History  Administered Date(s) Administered  . Influenza Split 02/17/2011  . Influenza Whole 01/02/2010  . Influenza,inj,Quad PF,36+ Mos 01/02/2013  . Influenza-Unspecified 01/01/2014, 01/06/2015  . Pneumococcal Polysaccharide-23 06/04/2008, 05/02/2013  . Tdap 08/14/2012   Screening Tests Health Maintenance  Topic Date Due  . INFLUENZA VACCINE  11/03/2015  . TETANUS/TDAP  08/15/2022  . HIV Screening  Completed      [name]:   Check out  online nutrition programs as GumSearch.nl and http://vang.com/; fit67m; sparkpeople  Will see Frances. BQuay Burowif IBS becomes a problem Will continue weight loss goals  During the course of the visit the patient was educated and counseled about the following appropriate screening and preventive services:   Vaccines to include Pneumoccal, Influenza, Hepatitis B, Td, Zostavax, HCV/ up to date at present  Electrocardiogram/ 03/2015  Cardiovascular Disease/ BP good, chol good; working on weight  Colorectal cancer screening/ 1990  Bone density screening/ competed x 1 due to early hyst; was normal  Diabetes screening/ A1c 5.9; Glucose normal; watching diet   Glaucoma screening/ q 6 months to fup on plaquenil  Mammography/ 12/2014  Nutrition counseling; improved; met goals from last year's visit to drink more water and is doing well; motivated   Patient Instructions (the  written [name]) was given to the patient.   Wynetta Fines, RN  07/29/2015     Medical screening examination/treatment/procedure(s) were performed by non-physician practitioner and as supervising physician I was immediately available for consultation/collaboration. I agree with above. Binnie Rail, MD

## 2015-08-07 DIAGNOSIS — M50222 Other cervical disc displacement at C5-C6 level: Secondary | ICD-10-CM | POA: Diagnosis not present

## 2015-08-07 DIAGNOSIS — M5030 Other cervical disc degeneration, unspecified cervical region: Secondary | ICD-10-CM | POA: Diagnosis not present

## 2015-08-07 DIAGNOSIS — M4722 Other spondylosis with radiculopathy, cervical region: Secondary | ICD-10-CM | POA: Diagnosis not present

## 2015-08-07 DIAGNOSIS — M50223 Other cervical disc displacement at C6-C7 level: Secondary | ICD-10-CM | POA: Diagnosis not present

## 2015-08-11 ENCOUNTER — Telehealth: Payer: Self-pay

## 2015-08-11 NOTE — Telephone Encounter (Signed)
Call rec'd from [facility] who stated she was starting to have diarrhea post eating;  Has hx of IBS; eating fiber on weight loss plan and has been doing well until a few days ago. Apt made with Dr. Sharlet Salina for fup or sooner if she needs to come in for treatment; Will stop some of the "new foods' she has been trying.

## 2015-08-12 DIAGNOSIS — M961 Postlaminectomy syndrome, not elsewhere classified: Secondary | ICD-10-CM | POA: Diagnosis not present

## 2015-08-12 DIAGNOSIS — M542 Cervicalgia: Secondary | ICD-10-CM | POA: Diagnosis not present

## 2015-08-12 DIAGNOSIS — M50223 Other cervical disc displacement at C6-C7 level: Secondary | ICD-10-CM | POA: Diagnosis not present

## 2015-08-12 DIAGNOSIS — M7712 Lateral epicondylitis, left elbow: Secondary | ICD-10-CM | POA: Diagnosis not present

## 2015-08-20 ENCOUNTER — Encounter: Payer: Self-pay | Admitting: Internal Medicine

## 2015-08-20 ENCOUNTER — Ambulatory Visit (INDEPENDENT_AMBULATORY_CARE_PROVIDER_SITE_OTHER): Payer: PPO | Admitting: Internal Medicine

## 2015-08-20 VITALS — BP 120/84 | HR 43 | Temp 98.7°F | Resp 16 | Wt 229.0 lb

## 2015-08-20 DIAGNOSIS — K648 Other hemorrhoids: Secondary | ICD-10-CM | POA: Diagnosis not present

## 2015-08-20 DIAGNOSIS — K644 Residual hemorrhoidal skin tags: Secondary | ICD-10-CM

## 2015-08-20 DIAGNOSIS — K589 Irritable bowel syndrome without diarrhea: Secondary | ICD-10-CM

## 2015-08-20 MED ORDER — DICYCLOMINE HCL 20 MG PO TABS: 20.0000 mg | ORAL_TABLET | Freq: Four times a day (QID) | ORAL | Status: DC

## 2015-08-20 MED ORDER — HYDROCORTISONE 2.5 % RE CREA: 1.0000 "application " | TOPICAL_CREAM | Freq: Two times a day (BID) | RECTAL | Status: DC

## 2015-08-20 NOTE — Patient Instructions (Addendum)
We will try bentyl for your IBS symptoms.  I also sent a cream for your hemorrhoids.  Try adjusting your diet.  Try eating a low FODMAP diet.     Characteristics and sources of common FODMAPs  F Fermentable    O Oligosaccharides Fructans, galacto-oligosaccharides Wheat, barley, rye, onion, leek, white part of spring onion, garlic, shallots, artichokes, beetroot, fennel, peas, chicory, pistachio, cashews, legumes, lentils, and chickpeas  D Disaccharides Lactose Milk, custard, ice cream, and yogurt  M Monosaccharides "Free fructose" (fructose in excess of glucose) Apples, pears, mangoes, cherries, watermelon, asparagus, sugar snap peas, honey, high-fructose corn syrup  A And  P Polyols Sorbitol, mannitol, maltitol, and xylitol Apples, pears, apricots, cherries, nectarines, peaches, plums, watermelon, mushrooms, cauliflower, artificially sweetened chewing gum and confectionery    Diet for Irritable Bowel Syndrome When you have irritable bowel syndrome (IBS), the foods you eat and your eating habits are very important. IBS may cause various symptoms, such as abdominal pain, constipation, or diarrhea. Choosing the right foods can help ease discomfort caused by these symptoms. Work with your health care provider and dietitian to find the best eating plan to help control your symptoms. WHAT GENERAL GUIDELINES DO I NEED TO FOLLOW?  Keep a food diary. This will help you identify foods that cause symptoms. Write down:  What you eat and when.  What symptoms you have.  When symptoms occur in relation to your meals.  Avoid foods that cause symptoms. Talk with your dietitian about other ways to get the same nutrients that are in these foods.  Eat more foods that contain fiber. Take a fiber supplement if directed by your dietitian.  Eat your meals slowly, in a relaxed setting.  Aim to eat 5-6 small meals per day. Do not skip meals.  Drink enough fluids to keep your urine clear or pale  yellow.  Ask your health care provider if you should take an over-the-counter probiotic during flare-ups to help restore healthy gut bacteria.  If you have cramping or diarrhea, try making your meals low in fat and high in carbohydrates. Examples of carbohydrates are pasta, rice, whole grain breads and cereals, fruits, and vegetables.  If dairy products cause your symptoms to flare up, try eating less of them. You might be able to handle yogurt better than other dairy products because it contains bacteria that help with digestion. WHAT FOODS ARE NOT RECOMMENDED? The following are some foods and drinks that may worsen your symptoms:  Fatty foods, such as [place] fries.  Milk products, such as cheese or ice cream.  Chocolate.  Alcohol.  Products with caffeine, such as coffee.  Carbonated drinks, such as soda. The items listed above may not be a complete list of foods and beverages to avoid. Contact your dietitian for more information. WHAT FOODS ARE GOOD SOURCES OF FIBER? Your health care provider or dietitian may recommend that you eat more foods that contain fiber. Fiber can help reduce constipation and other IBS symptoms. Add foods with fiber to your diet a little at a time so that your body can get used to them. Too much fiber at once might cause gas and swelling of your abdomen. The following are some foods that are good sources of fiber:  Apples.  Peaches.  Pears.  Berries.  Figs.  Broccoli (raw).  Cabbage.  Carrots.  Raw peas.  Kidney beans.  Lima beans.  Whole grain bread.  Whole grain cereal. FOR MORE INFORMATION  International Foundation for Functional Gastrointestinal  Disorders: www.iffgd.[employer] of Diabetes and Digestive and Kidney Diseases: NetworkAffair.co.za.aspx   This information is not intended to replace advice given to you by your health care provider. Make sure you  discuss any questions you have with your health care provider.   Document Released: 06/11/2003 Document Revised: 04/11/2014 Document Reviewed: 06/21/2013 Elsevier Interactive Patient Education [employer].

## 2015-08-20 NOTE — Progress Notes (Signed)
Subjective:    Patient ID: [name], female    DOB: 06/17/1970, 45 y.o.   MRN: [id number]  HPI She is here for an acute visit.   She was diagnosed with IBS years ago.  She has been eating very healthy and losing weight.  She is eating a lot of beans, veges and grains.  She has frequent abdominal cramping, bloating, nausea and abdominal cramping.  Last week she was constipated and this week she has had diarrhea.  She has a flare of her known external hemorrhoids.  She has tried several medications in the past   She is walking regularly.  She is losing weight and is happy with how she is doing.   Medications and allergies reviewed with patient and updated if appropriate.  Patient Active Problem List   Diagnosis Date Noted  . Prediabetes 06/05/2015  . GERD (gastroesophageal reflux disease) 06/04/2015  . Seroma complicating a procedure, initial encounter 04/19/2015  . Hyperglycemia 09/22/2014  . Cervical disc disorder with radiculopathy of cervical region 05/23/2014  . Neck pain 02/03/2014  . Ulnar neuropathy 01/16/2014  . Fibromyalgia 01/16/2014  . 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 Arthralgia 04/29/2013  . Panic attacks 01/15/2013  . IBS (irritable bowel syndrome) 12/14/2011  . Premature menopause on hormone replacement therapy 09/02/2011  . B12 deficiency 06/30/2009  . Hypothyroidism 03/02/2007  . POLYCYSTIC OVARIAN DISEASE 03/02/2007  . Essential hypertension 03/02/2007    Current Outpatient Prescriptions on File Prior to Visit  Medication Sig Dispense Refill  . aspirin 81 MG tablet Take 81 mg by mouth daily.     . celecoxib (CELEBREX) 200 MG capsule Take 200 capsules by mouth 2 (two) times daily.     . cholecalciferol (VITAMIN D) 1000 UNITS tablet Take 1,000 Units by mouth daily.    . citalopram (CELEXA) 20 MG tablet Take 1 tablet (20 mg total) by mouth daily. 90 tablet 3  . diazepam (VALIUM) 5 MG tablet Take 5 mg by mouth every 6 (six) hours as needed for  anxiety.    . fish oil-omega-3 fatty acids 1000 MG capsule Take 1 g by mouth daily.    [name] gabapentin (NEURONTIN) 300 MG capsule Take 2 capsules (600 mg total) by mouth 3 (three) times daily. 180 capsule 11  . hydrochlorothiazide (MICROZIDE) 12.5 MG capsule Take 1 capsule (12.5 mg total) by mouth daily. 90 capsule 1  . HYDROcodone-homatropine (HYCODAN) 5-1.5 MG/5ML syrup Take 5 mLs by mouth every 8 (eight) hours as needed for cough. 120 mL 0  . hydroxychloroquine (PLAQUENIL) 200 MG tablet Take 400 mg by mouth daily.     [name] lisinopril (PRINIVIL,ZESTRIL) 40 MG tablet TAKE 1 TABLET EVERY DAY 90 tablet 1  . loratadine (CLARITIN) 10 MG tablet Take 10 mg by mouth.    . Lysine 500 MG CAPS Take by mouth. Reported on 07/29/2015    . Multiple Vitamin (MULTIVITAMIN WITH MINERALS) TABS tablet Take 1 tablet by mouth daily.    [name] omeprazole (PRILOSEC) 40 MG capsule TAKE 1 CAPSULE EVERY MORNING 90 capsule 0  . Probiotic Product (PROBIOTIC FORMULA PO) Take by mouth.    . vitamin B-12 (CYANOCOBALAMIN) 1000 MCG tablet Take 1 tablet (1,000 mcg total) by mouth daily. 90 tablet 3   No current facility-administered medications on file prior to visit.    Past Medical History  Diagnosis Date  . Hypertension     a  . Anxiety   . Depression     during divorce & legal  matters  . Osteoarthritis   . GERD (gastroesophageal reflux disease)   . IBS (irritable bowel syndrome)   . Lupus (HCC) hx positive ANA    followed by Dr Gavin Pound; possible lupus  . History of polycystic ovarian disease S/P BSO  . 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 Chronic joint pain   . Myalgia   . History of kidney stones     Dr Phebe Colla  . Hx of anxiety disorder   . Hypothyroidism     no meds  . PONV (postoperative nausea and vomiting)   . Symptomatic mammary hypertrophy   . URI (upper respiratory infection)     currently on cefdinir    Past Surgical History  Procedure Laterality Date  . Eye surgery      Retinal tears surgery  bilateral  . Tonsillectomy    . Carpal tunnel release      bilateral; Dr Sherwood Gambler  . Plantar fascia surgery      right  . Plantar fascia surgery  02/2011    left  . Cystoscopy w/ ureteral stent placement  11/12/2011    Procedure: CYSTOSCOPY WITH RETROGRADE PYELOGRAM/URETERAL STENT PLACEMENT;  Surgeon: Alexis Frock, MD;  Location: [facility];  Service: Urology;  Laterality: Right;  . Laparoscopic cholecystectomy  05-31-2007  . Cysto/ right retrograde ureteral pyelogram  07-09-2004    HX BILATERAL RENAL STONES/ RIGHT FLANK PAIN  . Excision left bartholin gland  02-05-2002  . Laparoscopy with right salpingo-oophectomy/ lysis adhesions and ablation endometriosis  05-17-2001  . Right ureteroscopic stone extraction  08-04-2000  . Laparoscopic laser ablation endometriosis and left salpingo-oophorectomy  11-03-1999  . Laparoscopic assisted vaginal hysterectomy  2000  . Dilation and curettage of uterus      X5  . Ankle surgery      X3  . Appendectomy  1991    exploratory lap  . Lumbar laminectomy  2003    L4 - L5  . Ureteral stent placement  11/12/11    Dr Tresa Moore  . Cystoscopy w/ ureteral stent removal  12/07/2011    Procedure: CYSTOSCOPY WITH STENT REMOVAL;  Surgeon: Alexis Frock, MD;  [facility];  Service: Urology;  Laterality: Right;  . Cystoscopy/retrograde/ureteroscopy/stone extraction with basket  12/07/2011    Procedure: CYSTOSCOPY/RETROGRADE/URETEROSCOPY/STONE EXTRACTION WITH BASKET;  Surgeon: Alexis Frock, MD;  Location: [facility];  Service: Urology;  Laterality: Right;  . Colonoscopy      in 1990s  . Breast reduction surgery Bilateral 03/26/2015    Procedure: BILATERAL BREAST REDUCTION ;  Surgeon: Wallace Going, DO;  Location: [place];  Service: Plastics;  Laterality: Bilateral;  . Liposuction Bilateral 03/26/2015    Procedure: WITH LIPOSUCTION;  Surgeon: Wallace Going, DO;  Location: [place];   Service: Plastics;  Laterality: Bilateral;    Social History   Social History  . Marital Status: Married    Spouse Name: Darnelle Maffucci  . Number of Children: 0  . Years of Education: college   Occupational History  . disabled    Social History Main Topics  . Smoking status: Never Smoker   . Smokeless tobacco: Never Used  . Alcohol Use: 0.6 oz/week    1 Shots of liquor per week     Comment:  very rarely  . Drug Use: No  . Sexual Activity: Yes    Birth Control/ Protection: Surgical   Other Topics Concern  . Not on file   Social History Narrative  Patient Lives at home with her husband Darnelle Maffucci)   Disabled.   Education two years of college.   Right handed.   Caffeine coffee and sweet tea. Not daily.    Family History  Problem Relation Age of Onset  . Hypertension Mother   . Colon polyps Mother   . Atrial fibrillation Mother     Dr Angelena Form  . Heart attack Father 57  . Diabetes Father   . Hypertension Sister   . Heart attack Paternal Grandmother 32  . Breast cancer Paternal Aunt   . Colon cancer Maternal Uncle   . Colon cancer Paternal Uncle   . Colon polyps Maternal Grandmother   . Stroke Maternal Grandmother 76  . Diabetes Paternal Grandfather   . Stroke Maternal Grandfather 37    Review of Systems  Gastrointestinal: Positive for nausea, abdominal pain (cramping), diarrhea, constipation, abdominal distention (bloating) and anal bleeding (hemorrhoidal). Negative for vomiting and blood in stool.       Objective:   Filed Vitals:   08/20/15 1103  BP: 120/84  Pulse: 43  Temp: 98.7 F (37.1 C)  Resp: 16   Filed Weights   08/20/15 1103  Weight: 229 lb (103.874 kg)   Body mass index is 34.83 kg/(m^2).   Physical Exam  Constitutional: [name]. No distress.  Abdominal: Soft. She exhibits no distension and no mass. There is no tenderness. There is no rebound and no guarding.  Genitourinary:  Deferred rectal exam  Skin:  She is not diaphoretic.  Psychiatric: She has a normal mood and affect. Her behavior is normal.        Assessment & Plan:   See Problem List for Assessment and Plan of chronic medical problems.

## 2015-08-20 NOTE — Assessment & Plan Note (Signed)
Using preparation H Will try anusol cream

## 2015-08-20 NOTE — Assessment & Plan Note (Addendum)
Diagnosed years ago - now more symptomatic with eating healthy  - high fiber/grain/vege diet Bloating, increase gas and cramping.  Diarrhea and constipation Revise diet Trial of Bentyl - she has been on this in the past and has tried several other medications Can retry other meds if this does not work Reviewed low FODMAP diet - info given Will refer to GI if needed

## 2015-08-20 NOTE — Progress Notes (Signed)
Pre visit review using our clinic review tool, if applicable. No additional management support is needed unless otherwise documented below in the visit note. 

## 2015-09-25 NOTE — Telephone Encounter (Signed)
A user error has taken place:Not sure why encounter is still open since march. Closing encounter...[name]

## 2015-09-27 ENCOUNTER — Encounter: Payer: Self-pay | Admitting: Internal Medicine

## 2015-09-27 DIAGNOSIS — M2550 Pain in unspecified joint: Secondary | ICD-10-CM

## 2015-09-27 DIAGNOSIS — R5383 Other fatigue: Secondary | ICD-10-CM

## 2015-09-27 DIAGNOSIS — K589 Irritable bowel syndrome without diarrhea: Secondary | ICD-10-CM

## 2015-09-30 ENCOUNTER — Other Ambulatory Visit (INDEPENDENT_AMBULATORY_CARE_PROVIDER_SITE_OTHER): Payer: PPO

## 2015-09-30 DIAGNOSIS — R5383 Other fatigue: Secondary | ICD-10-CM | POA: Diagnosis not present

## 2015-09-30 DIAGNOSIS — M2550 Pain in unspecified joint: Secondary | ICD-10-CM

## 2015-09-30 LAB — COMPREHENSIVE METABOLIC PANEL
ALT: 17 U/L (ref 0–35)
AST: 18 U/L (ref 0–37)
Albumin: 4 g/dL (ref 3.5–5.2)
Alkaline Phosphatase: 92 U/L (ref 39–117)
BILIRUBIN TOTAL: 0.4 mg/dL (ref 0.2–1.2)
BUN: 19 mg/dL (ref 6–23)
CHLORIDE: 106 meq/L (ref 96–112)
CO2: 27 meq/L (ref 19–32)
CREATININE: 0.72 mg/dL (ref 0.40–1.20)
Calcium: 9.3 mg/dL (ref 8.4–10.5)
GFR: 92.74 mL/min (ref >60.00)
GLUCOSE: 89 mg/dL (ref 70–99)
Potassium: 4 mEq/L (ref 3.5–5.1)
SODIUM: 139 meq/L (ref 135–145)
Total Protein: 7.1 g/dL (ref 6.0–8.3)

## 2015-09-30 LAB — CBC WITH DIFFERENTIAL/PLATELET
BASOS ABS: 0 10*3/uL (ref 0.0–0.1)
BASOS PCT: 0.5 % (ref 0.0–3.0)
EOS ABS: 0.2 10*3/uL (ref 0.0–0.7)
Eosinophils Relative: 2.8 % (ref 0.0–5.0)
HCT: 40.1 % (ref 36.0–46.0)
Hemoglobin: 13.2 g/dL (ref 12.0–15.0)
LYMPHS ABS: 1.9 10*3/uL (ref 0.7–4.0)
Lymphocytes Relative: 28.9 % (ref 12.0–46.0)
MCHC: 33 g/dL (ref 30.0–36.0)
MCV: 80.9 fl (ref 78.0–100.0)
Monocytes Absolute: 0.5 10*3/uL (ref 0.1–1.0)
Monocytes Relative: 8.1 % (ref 3.0–12.0)
NEUTROS ABS: 4 10*3/uL (ref 1.4–7.7)
NEUTROS PCT: 59.7 % (ref 43.0–77.0)
PLATELETS: 209 10*3/uL (ref 150.0–400.0)
RBC: 4.95 Mil/uL (ref 3.87–5.11)
RDW: 16.6 % — AB (ref 11.5–15.5)
WBC: 6.7 10*3/uL (ref 4.0–10.5)

## 2015-09-30 LAB — SEDIMENTATION RATE: Sed Rate: 11 mm/hr (ref 0–20)

## 2015-09-30 LAB — C-REACTIVE PROTEIN: CRP: 0.9 mg/dL (ref 0.5–20.0)

## 2015-10-01 ENCOUNTER — Telehealth: Payer: Self-pay | Admitting: Internal Medicine

## 2015-10-01 NOTE — Telephone Encounter (Signed)
Spoke with pt to inform that labs were not released yet and we would contact her when they were.

## 2015-10-01 NOTE — Telephone Encounter (Signed)
Patient called and wanted to know results of labs. Very Anxious. Please advise

## 2015-10-02 ENCOUNTER — Encounter: Payer: Self-pay | Admitting: Internal Medicine

## 2015-10-14 DIAGNOSIS — M542 Cervicalgia: Secondary | ICD-10-CM | POA: Diagnosis not present

## 2015-10-14 DIAGNOSIS — M50223 Other cervical disc displacement at C6-C7 level: Secondary | ICD-10-CM | POA: Diagnosis not present

## 2015-10-14 DIAGNOSIS — M5412 Radiculopathy, cervical region: Secondary | ICD-10-CM | POA: Diagnosis not present

## 2015-10-30 ENCOUNTER — Telehealth: Payer: Self-pay | Admitting: Gastroenterology

## 2015-10-30 NOTE — Telephone Encounter (Signed)
Dr. Fuller Plan do you approve of switch?

## 2015-11-02 ENCOUNTER — Ambulatory Visit: Payer: PPO | Admitting: Podiatry

## 2015-11-02 NOTE — Telephone Encounter (Signed)
Dr. Silverio Decamp will you accept?

## 2015-11-02 NOTE — Telephone Encounter (Signed)
OK 

## 2015-11-03 DIAGNOSIS — M4722 Other spondylosis with radiculopathy, cervical region: Secondary | ICD-10-CM | POA: Diagnosis not present

## 2015-11-03 DIAGNOSIS — M5412 Radiculopathy, cervical region: Secondary | ICD-10-CM | POA: Diagnosis not present

## 2015-11-03 DIAGNOSIS — M5030 Other cervical disc degeneration, unspecified cervical region: Secondary | ICD-10-CM | POA: Diagnosis not present

## 2015-11-03 DIAGNOSIS — M5020 Other cervical disc displacement, unspecified cervical region: Secondary | ICD-10-CM | POA: Diagnosis not present

## 2015-11-04 ENCOUNTER — Ambulatory Visit (INDEPENDENT_AMBULATORY_CARE_PROVIDER_SITE_OTHER): Payer: PPO

## 2015-11-04 ENCOUNTER — Encounter: Payer: Self-pay | Admitting: Podiatry

## 2015-11-04 ENCOUNTER — Ambulatory Visit (INDEPENDENT_AMBULATORY_CARE_PROVIDER_SITE_OTHER): Payer: PPO | Admitting: Podiatry

## 2015-11-04 ENCOUNTER — Ambulatory Visit: Payer: PPO

## 2015-11-04 VITALS — BP 121/88 | HR 69 | Resp 12

## 2015-11-04 DIAGNOSIS — R52 Pain, unspecified: Secondary | ICD-10-CM

## 2015-11-04 DIAGNOSIS — M6740 Ganglion, unspecified site: Secondary | ICD-10-CM

## 2015-11-04 NOTE — Progress Notes (Signed)
   Subjective:    Patient ID: [name], female    DOB: 21-Jul-1970, 45 y.o.   MRN: [id number]  HPI: She presents today as a 45 year old female with a history of lupus chief complaint of a painful lesion dorsal aspect of the DIPJ second toe left foot. She was referred by her rheumatologist. She also has stinging and burning which she has had all of her life to the lateral aspect of the bilateral foot. She also states that she is about to have a cervical 3 level neck fusion. She has had back fusion in the past.  Review of Systems  Skin: Positive for color change.       Objective:   Physical Exam: Vital signs are stable she is alert and oriented 3 pulses are palpable. Neurologic sensorium is intact. Deep tendon reflexes are intact. Muscle strength +5 over 5 dorsiflexion plantar flexors and inverters everters on physical musculatures intact. Orthopedic evaluation and strips all joints distal ankle range of motion without crepitation. She has mild flexor contraction at the DIPJ second digit left foot. Radiographic evaluation does not demonstrates any type of osseus abnormalities of the left foot today. Rectus foot is otherwise noted. No bony lesions noted. No osteoarthritic process noted. Cutaneous evaluation does demonstrate a small mucoid cyst to the distal lateral DIPJ left.      Assessment & Plan:  Mucoid cyst left foot.  Plan: I recommended that she follow up with me after she is cleared by her neurosurgeon for surgical arthroplasty her second digit DIPJ left foot.

## 2015-11-16 NOTE — Telephone Encounter (Signed)
Dr. Silverio Decamp will you accept?

## 2015-11-16 NOTE — Telephone Encounter (Signed)
ok 

## 2015-11-18 DIAGNOSIS — M50322 Other cervical disc degeneration at C5-C6 level: Secondary | ICD-10-CM | POA: Diagnosis not present

## 2015-11-18 DIAGNOSIS — M47812 Spondylosis without myelopathy or radiculopathy, cervical region: Secondary | ICD-10-CM | POA: Diagnosis not present

## 2015-11-18 DIAGNOSIS — M50222 Other cervical disc displacement at C5-C6 level: Secondary | ICD-10-CM | POA: Diagnosis not present

## 2015-11-18 DIAGNOSIS — M5020 Other cervical disc displacement, unspecified cervical region: Secondary | ICD-10-CM | POA: Diagnosis not present

## 2015-11-23 DIAGNOSIS — M542 Cervicalgia: Secondary | ICD-10-CM | POA: Diagnosis not present

## 2015-11-26 ENCOUNTER — Encounter: Payer: Self-pay | Admitting: Gastroenterology

## 2015-11-26 NOTE — Telephone Encounter (Signed)
Spoke to patient and scheduled her for next available 02-10-16 w/Dr Nandigam.

## 2015-12-09 ENCOUNTER — Ambulatory Visit: Payer: PPO | Admitting: Podiatry

## 2015-12-14 ENCOUNTER — Encounter: Payer: Self-pay | Admitting: Internal Medicine

## 2015-12-17 ENCOUNTER — Other Ambulatory Visit: Payer: Self-pay | Admitting: *Deleted

## 2015-12-17 MED ORDER — LISINOPRIL 40 MG PO TABS: ORAL_TABLET | ORAL | 1 refills | Status: DC

## 2015-12-17 MED ORDER — HYDROCHLOROTHIAZIDE 12.5 MG PO CAPS: 12.5000 mg | ORAL_CAPSULE | Freq: Every day | ORAL | 1 refills | Status: DC

## 2015-12-17 NOTE — Telephone Encounter (Signed)
Rec'd call pt requesting refills to be sent to [facility] on her Lisinopril & HCTZ. Sent electronically...Johny Chess

## 2015-12-23 ENCOUNTER — Encounter: Payer: Self-pay | Admitting: Podiatry

## 2015-12-23 ENCOUNTER — Ambulatory Visit (INDEPENDENT_AMBULATORY_CARE_PROVIDER_SITE_OTHER): Payer: PPO | Admitting: Podiatry

## 2015-12-23 DIAGNOSIS — M6740 Ganglion, unspecified site: Secondary | ICD-10-CM | POA: Diagnosis not present

## 2015-12-23 NOTE — Progress Notes (Signed)
She presents today for follow-up of her mucoid cyst second digit of the left foot. She states that she has not been released from neurosurgery yet. But she wanted to ahead and do the consent for surgery for the left foot.  Objective: Vital signs are stable she's alert and oriented 3. Pulses are palpable. 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  Assessment: Mucoid cyst DIPJ left. Mallet toe deformity second left.  Plan: We went over the consent form today line by line number by number giving her ample time to ask questions she saw fit regarding excision mucoid cyst and arthroplasty DIPJ. I answered all the questions regarding this procedure to the best of my ability in layman's terms. She understands this is amenable to it and signed all 3 pages of the consent form. We dispensed information regarding surgery Center as well as a [facility] shoe and preop instructions. Follow-up with her in the near future.

## 2015-12-23 NOTE — Patient Instructions (Signed)

## 2016-01-08 DIAGNOSIS — M542 Cervicalgia: Secondary | ICD-10-CM | POA: Diagnosis not present

## 2016-01-08 DIAGNOSIS — M159 Polyosteoarthritis, unspecified: Secondary | ICD-10-CM | POA: Diagnosis not present

## 2016-01-08 DIAGNOSIS — M25552 Pain in left hip: Secondary | ICD-10-CM | POA: Diagnosis not present

## 2016-01-08 DIAGNOSIS — Z981 Arthrodesis status: Secondary | ICD-10-CM | POA: Diagnosis not present

## 2016-01-08 DIAGNOSIS — M2550 Pain in unspecified joint: Secondary | ICD-10-CM | POA: Diagnosis not present

## 2016-01-08 DIAGNOSIS — Z23 Encounter for immunization: Secondary | ICD-10-CM | POA: Diagnosis not present

## 2016-01-08 DIAGNOSIS — M064 Inflammatory polyarthropathy: Secondary | ICD-10-CM | POA: Diagnosis not present

## 2016-01-08 DIAGNOSIS — Z79899 Other long term (current) drug therapy: Secondary | ICD-10-CM | POA: Diagnosis not present

## 2016-01-08 DIAGNOSIS — M25512 Pain in left shoulder: Secondary | ICD-10-CM | POA: Diagnosis not present

## 2016-01-13 DIAGNOSIS — M5030 Other cervical disc degeneration, unspecified cervical region: Secondary | ICD-10-CM | POA: Diagnosis not present

## 2016-01-13 DIAGNOSIS — M25511 Pain in right shoulder: Secondary | ICD-10-CM | POA: Diagnosis not present

## 2016-01-13 DIAGNOSIS — M25552 Pain in left hip: Secondary | ICD-10-CM | POA: Diagnosis not present

## 2016-01-14 ENCOUNTER — Telehealth: Payer: Self-pay | Admitting: Internal Medicine

## 2016-01-14 NOTE — Telephone Encounter (Signed)
Please update chart to reflect patient got flu shot at Dr. Gavin Pound office on 10/6.

## 2016-01-14 NOTE — Telephone Encounter (Signed)
Updated.

## 2016-01-19 ENCOUNTER — Other Ambulatory Visit: Payer: Self-pay | Admitting: Rheumatology

## 2016-01-19 DIAGNOSIS — M25552 Pain in left hip: Secondary | ICD-10-CM

## 2016-01-21 ENCOUNTER — Other Ambulatory Visit: Payer: PPO

## 2016-01-21 ENCOUNTER — Telehealth: Payer: Self-pay | Admitting: Internal Medicine

## 2016-01-21 MED ORDER — HYDROCHLOROTHIAZIDE 12.5 MG PO CAPS: 12.5000 mg | ORAL_CAPSULE | Freq: Every day | ORAL | 0 refills | Status: DC

## 2016-01-21 MED ORDER — HYDROCHLOROTHIAZIDE 12.5 MG PO CAPS: 12.5000 mg | ORAL_CAPSULE | Freq: Every day | ORAL | 1 refills | Status: DC

## 2016-01-21 NOTE — Telephone Encounter (Signed)
Send in 5 day supply to local pharmacy.  Have her monitor her BP and HR daily  -  If not controlled she needs to come in.

## 2016-01-21 NOTE — Telephone Encounter (Signed)
LVM informing pt rxs were sent to local and mail order.

## 2016-01-21 NOTE — Telephone Encounter (Signed)
[facility]  Patient Name: [name]  DOB: 11-Aug-1970    Initial Comment Caller states she is on bp meds, has been good lately. Last night woke up with heart racing, hr 123. Feeling anxious now, bp 133/103.   Nurse Assessment  Nurse: Harlow Mares, RN, Suanne Marker Date/Time Eilene Ghazi Time): 01/21/2016 9:11:25 AM  Confirm and document reason for call. If symptomatic, describe symptoms. You must click the next button to save text entered. ---Caller states she is on bp meds, has been good lately. Last night woke up with heart racing, hr 123. Feeling anxious now, bp 133/103. Reports that she ran out of one of her meds (HCTZ) about 4 days ago and is awaiting it's delivery from mail order.  Has the patient traveled out of the country within the last 30 days? ---Not Applicable  Does the patient have any new or worsening symptoms? ---Yes  Will a triage be completed? ---Yes  Related visit to physician within the last 2 weeks? ---N/A  Does the PT have any chronic conditions? (i.e. diabetes, asthma, etc.) ---Yes  List chronic conditions. ---HTN; lupus; osteoarthritis; fibromyalgia  Is the patient pregnant or possibly pregnant? (Ask all females between the ages of 55-55) ---No  Is this a behavioral health or substance abuse call? ---No     Guidelines    Guideline Title Affirmed Question Affirmed Notes  High Blood Pressure Ran out of BP medications    Final Disposition User   Call PCP within 24 Hours [place], RN, Suanne Marker    Comments  Patient requests a short supply of the HCTZ until her mail order comes in (expected in the next 2-3 days). Please send to the [facility] @ [phone].   Referrals  REFERRED TO PCP OFFICE   Disagree/Comply: Comply

## 2016-02-10 ENCOUNTER — Ambulatory Visit: Payer: PPO | Admitting: Gastroenterology

## 2016-02-17 DIAGNOSIS — M25511 Pain in right shoulder: Secondary | ICD-10-CM | POA: Diagnosis not present

## 2016-02-17 DIAGNOSIS — M5030 Other cervical disc degeneration, unspecified cervical region: Secondary | ICD-10-CM | POA: Diagnosis not present

## 2016-03-07 DIAGNOSIS — M5440 Lumbago with sciatica, unspecified side: Secondary | ICD-10-CM | POA: Diagnosis not present

## 2016-03-07 DIAGNOSIS — M5416 Radiculopathy, lumbar region: Secondary | ICD-10-CM | POA: Diagnosis not present

## 2016-03-07 DIAGNOSIS — M5136 Other intervertebral disc degeneration, lumbar region: Secondary | ICD-10-CM | POA: Diagnosis not present

## 2016-03-07 DIAGNOSIS — Z981 Arthrodesis status: Secondary | ICD-10-CM | POA: Diagnosis not present

## 2016-03-07 DIAGNOSIS — M47816 Spondylosis without myelopathy or radiculopathy, lumbar region: Secondary | ICD-10-CM | POA: Diagnosis not present

## 2016-03-10 ENCOUNTER — Telehealth: Payer: Self-pay | Admitting: *Deleted

## 2016-03-10 NOTE — Telephone Encounter (Signed)
"  I'm a patient of Dr. Milinda Pointer.  He wanted to do some surgery on my little toe beside my big toe on my left foot.  The thing is I have had surgery on my neck in August with Dr. Sherwood Gambler.  He has not released me yet but he told me everything was going well.  I go back to see him in couple of weeks.  I'm wondering if he can do it in January. Give me a call back.  I attempted to call patient back.  I left messages for her to give me a call back.

## 2016-03-11 DIAGNOSIS — Z1231 Encounter for screening mammogram for malignant neoplasm of breast: Secondary | ICD-10-CM | POA: Diagnosis not present

## 2016-03-21 DIAGNOSIS — H31003 Unspecified chorioretinal scars, bilateral: Secondary | ICD-10-CM | POA: Diagnosis not present

## 2016-03-21 DIAGNOSIS — H16223 Keratoconjunctivitis sicca, not specified as Sjogren's, bilateral: Secondary | ICD-10-CM | POA: Diagnosis not present

## 2016-03-21 DIAGNOSIS — H524 Presbyopia: Secondary | ICD-10-CM | POA: Diagnosis not present

## 2016-03-21 DIAGNOSIS — H5213 Myopia, bilateral: Secondary | ICD-10-CM | POA: Diagnosis not present

## 2016-03-21 DIAGNOSIS — H52223 Regular astigmatism, bilateral: Secondary | ICD-10-CM | POA: Diagnosis not present

## 2016-03-21 DIAGNOSIS — Z79899 Other long term (current) drug therapy: Secondary | ICD-10-CM | POA: Diagnosis not present

## 2016-03-21 DIAGNOSIS — H04123 Dry eye syndrome of bilateral lacrimal glands: Secondary | ICD-10-CM | POA: Diagnosis not present

## 2016-03-22 DIAGNOSIS — M5416 Radiculopathy, lumbar region: Secondary | ICD-10-CM | POA: Diagnosis not present

## 2016-03-22 DIAGNOSIS — M47816 Spondylosis without myelopathy or radiculopathy, lumbar region: Secondary | ICD-10-CM | POA: Diagnosis not present

## 2016-03-22 DIAGNOSIS — M5440 Lumbago with sciatica, unspecified side: Secondary | ICD-10-CM | POA: Diagnosis not present

## 2016-03-22 DIAGNOSIS — M5020 Other cervical disc displacement, unspecified cervical region: Secondary | ICD-10-CM | POA: Diagnosis not present

## 2016-03-22 DIAGNOSIS — M5136 Other intervertebral disc degeneration, lumbar region: Secondary | ICD-10-CM | POA: Diagnosis not present

## 2016-03-24 DIAGNOSIS — M5416 Radiculopathy, lumbar region: Secondary | ICD-10-CM | POA: Diagnosis not present

## 2016-03-25 DIAGNOSIS — M5416 Radiculopathy, lumbar region: Secondary | ICD-10-CM | POA: Diagnosis not present

## 2016-03-25 DIAGNOSIS — M545 Low back pain: Secondary | ICD-10-CM | POA: Diagnosis not present

## 2016-04-06 DIAGNOSIS — M47816 Spondylosis without myelopathy or radiculopathy, lumbar region: Secondary | ICD-10-CM | POA: Diagnosis not present

## 2016-04-06 DIAGNOSIS — M5416 Radiculopathy, lumbar region: Secondary | ICD-10-CM | POA: Diagnosis not present

## 2016-04-06 DIAGNOSIS — M25551 Pain in right hip: Secondary | ICD-10-CM | POA: Diagnosis not present

## 2016-04-06 DIAGNOSIS — M5136 Other intervertebral disc degeneration, lumbar region: Secondary | ICD-10-CM | POA: Diagnosis not present

## 2016-04-13 ENCOUNTER — Telehealth: Payer: Self-pay | Admitting: *Deleted

## 2016-04-13 NOTE — Telephone Encounter (Signed)
"  You just called me.  I got your message.  I just got released by Dr. Sherwood Gambler from my neck surgery.  I would like to schedule my foot surgery."  Do you have a date in mind?  Dr. Milinda Pointer does surgeries on Fridays.  "Does he have anything next week?  I want to ahead and get this over with."  No, he does not.  He can do it on January 26 or February 2.  "I'll do it on January 26.  Thank you so much."

## 2016-04-13 NOTE — Telephone Encounter (Signed)
I attempted to return patient's call.  I left messages for her to call me back.

## 2016-04-27 ENCOUNTER — Other Ambulatory Visit: Payer: Self-pay | Admitting: *Deleted

## 2016-04-27 MED ORDER — HYDROCHLOROTHIAZIDE 12.5 MG PO CAPS: 12.5000 mg | ORAL_CAPSULE | Freq: Every day | ORAL | 0 refills | Status: DC

## 2016-04-27 MED ORDER — LISINOPRIL 40 MG PO TABS: 40.0000 mg | ORAL_TABLET | Freq: Every day | ORAL | 0 refills | Status: DC

## 2016-04-27 MED ORDER — LISINOPRIL 40 MG PO TABS: ORAL_TABLET | ORAL | 0 refills | Status: DC

## 2016-04-27 MED ORDER — CELECOXIB 200 MG PO CAPS: 200.0000 mg | ORAL_CAPSULE | Freq: Two times a day (BID) | ORAL | 0 refills | Status: DC

## 2016-04-27 NOTE — Telephone Encounter (Signed)
Rec'd call pt states she can not get in contact w/[facility] and needing to order her medications. Only have 3 days left and not sure if she will recie med in 3 days. Inform pt we can send her 2 wk supply to local pharmay, and 90 day script to [facility]. Verified local pharmacy sent requested meds to both pharmacy...Johny Chess

## 2016-04-28 ENCOUNTER — Other Ambulatory Visit: Payer: Self-pay | Admitting: Emergency Medicine

## 2016-04-28 ENCOUNTER — Other Ambulatory Visit: Payer: Self-pay | Admitting: Podiatry

## 2016-04-28 MED ORDER — CITALOPRAM HYDROBROMIDE 20 MG PO TABS: 20.0000 mg | ORAL_TABLET | Freq: Every day | ORAL | 0 refills | Status: DC

## 2016-04-28 MED ORDER — ONDANSETRON HCL 4 MG PO TABS: 4.0000 mg | ORAL_TABLET | Freq: Three times a day (TID) | ORAL | 0 refills | Status: DC | PRN

## 2016-04-28 MED ORDER — MEPERIDINE HCL 50 MG PO TABS: ORAL_TABLET | ORAL | 0 refills | Status: DC

## 2016-04-28 MED ORDER — CITALOPRAM HYDROBROMIDE 20 MG PO TABS: 20.0000 mg | ORAL_TABLET | Freq: Every day | ORAL | 1 refills | Status: DC

## 2016-04-28 MED ORDER — CEPHALEXIN 500 MG PO CAPS: 500.0000 mg | ORAL_CAPSULE | Freq: Three times a day (TID) | ORAL | 0 refills | Status: DC

## 2016-04-28 NOTE — Telephone Encounter (Signed)
Pt called back stating that the wrong medication was sent in yesterday. Pt was needing Celexa, which was removed from her med list in error. RX has been sent in to local and mail order.

## 2016-04-29 ENCOUNTER — Encounter: Payer: Self-pay | Admitting: Podiatry

## 2016-04-29 DIAGNOSIS — M67472 Ganglion, left ankle and foot: Secondary | ICD-10-CM | POA: Diagnosis not present

## 2016-04-29 DIAGNOSIS — M71372 Other bursal cyst, left ankle and foot: Secondary | ICD-10-CM | POA: Diagnosis not present

## 2016-04-29 DIAGNOSIS — M205X2 Other deformities of toe(s) (acquired), left foot: Secondary | ICD-10-CM | POA: Diagnosis not present

## 2016-04-29 DIAGNOSIS — I10 Essential (primary) hypertension: Secondary | ICD-10-CM | POA: Diagnosis not present

## 2016-04-29 DIAGNOSIS — M2042 Other hammer toe(s) (acquired), left foot: Secondary | ICD-10-CM | POA: Diagnosis not present

## 2016-05-03 ENCOUNTER — Telehealth: Payer: Self-pay

## 2016-05-03 NOTE — Telephone Encounter (Signed)
Spoke with pt regarding post op status for surgery on 04/29/16. She states that she has been doing well, minimal pain. She denies s/s of infection. She is to remain in shoe at all times. Call with any acute symptom changes

## 2016-05-04 ENCOUNTER — Ambulatory Visit (INDEPENDENT_AMBULATORY_CARE_PROVIDER_SITE_OTHER): Payer: Self-pay | Admitting: Podiatry

## 2016-05-04 ENCOUNTER — Ambulatory Visit (INDEPENDENT_AMBULATORY_CARE_PROVIDER_SITE_OTHER): Payer: PPO

## 2016-05-04 VITALS — BP 136/82 | HR 78 | Temp 98.6°F | Resp 18

## 2016-05-04 DIAGNOSIS — M2042 Other hammer toe(s) (acquired), left foot: Secondary | ICD-10-CM | POA: Diagnosis not present

## 2016-05-04 DIAGNOSIS — M6740 Ganglion, unspecified site: Secondary | ICD-10-CM | POA: Diagnosis not present

## 2016-05-04 DIAGNOSIS — Z9889 Other specified postprocedural states: Secondary | ICD-10-CM

## 2016-05-04 NOTE — Progress Notes (Signed)
She presents today for follow-up second toe left foot where removed the cyst. She had a DIPJ arthroplasties well. Denies fever chills nausea vomiting muscle aches and pains. He states it hasn't hurt at all.  Objective: Vital signs are stable she is alert and oriented 3 dress her dressing was removed demonstrates no erythema cellulitis drainage or odor rectus second toe radiographs confirm DIPJ arthroplasty.  Assessment: Wound surgical toe margins well coapted second left.  Plan: Redressed the toe today follow up with her in 1 week for suture removal.

## 2016-05-05 ENCOUNTER — Encounter: Payer: Self-pay | Admitting: Podiatry

## 2016-05-09 DIAGNOSIS — Z79899 Other long term (current) drug therapy: Secondary | ICD-10-CM | POA: Diagnosis not present

## 2016-05-10 NOTE — Progress Notes (Signed)
DOS 01.26.2018 2nd Metatarsal osteotomy right foot with screws. Hammertoe repair 2nd toe right foot with pin/screw.

## 2016-05-10 NOTE — Progress Notes (Signed)
DOS 01.26.2018 Excision mucoid cyst 2nd toe left. Arthroplasty DiPj 2nd toe left.

## 2016-05-11 ENCOUNTER — Ambulatory Visit (INDEPENDENT_AMBULATORY_CARE_PROVIDER_SITE_OTHER): Payer: Self-pay | Admitting: Podiatry

## 2016-05-11 DIAGNOSIS — Z9889 Other specified postprocedural states: Secondary | ICD-10-CM

## 2016-05-11 DIAGNOSIS — M6740 Ganglion, unspecified site: Secondary | ICD-10-CM

## 2016-05-11 DIAGNOSIS — M2042 Other hammer toe(s) (acquired), left foot: Secondary | ICD-10-CM

## 2016-05-11 NOTE — Progress Notes (Signed)
She presents today for follow-up of her excision mucoid cyst and arthroplasty DIPJ second digit left foot. She states this seems to be doing well. She denies chest pain shortness of breath or calf pain.  Objective: Sutures were removed today margins remain well coapted a stenosis on some infection.  Assessment: Well-healing excision of mucoid cyst and arthroplasty second digit.  Plan: I encouraged her to continue to dress this on her regular basis with toe band. Follow-up with her in a couple of 3 weeks.

## 2016-05-13 DIAGNOSIS — D225 Melanocytic nevi of trunk: Secondary | ICD-10-CM | POA: Diagnosis not present

## 2016-05-13 DIAGNOSIS — L258 Unspecified contact dermatitis due to other agents: Secondary | ICD-10-CM | POA: Diagnosis not present

## 2016-05-13 DIAGNOSIS — M7061 Trochanteric bursitis, right hip: Secondary | ICD-10-CM | POA: Diagnosis not present

## 2016-05-13 DIAGNOSIS — L308 Other specified dermatitis: Secondary | ICD-10-CM | POA: Diagnosis not present

## 2016-05-17 DIAGNOSIS — M25551 Pain in right hip: Secondary | ICD-10-CM | POA: Diagnosis not present

## 2016-05-17 DIAGNOSIS — M7521 Bicipital tendinitis, right shoulder: Secondary | ICD-10-CM | POA: Diagnosis not present

## 2016-05-17 DIAGNOSIS — M47816 Spondylosis without myelopathy or radiculopathy, lumbar region: Secondary | ICD-10-CM | POA: Diagnosis not present

## 2016-05-25 ENCOUNTER — Ambulatory Visit (INDEPENDENT_AMBULATORY_CARE_PROVIDER_SITE_OTHER): Payer: PPO

## 2016-05-25 ENCOUNTER — Encounter: Payer: Self-pay | Admitting: *Deleted

## 2016-05-25 ENCOUNTER — Ambulatory Visit (INDEPENDENT_AMBULATORY_CARE_PROVIDER_SITE_OTHER): Payer: Self-pay | Admitting: Podiatry

## 2016-05-25 DIAGNOSIS — M2042 Other hammer toe(s) (acquired), left foot: Secondary | ICD-10-CM | POA: Diagnosis not present

## 2016-05-25 DIAGNOSIS — Z9889 Other specified postprocedural states: Secondary | ICD-10-CM

## 2016-05-25 NOTE — Progress Notes (Signed)
She presents today for another postop visit date of surgery 04/29/2016 with excision of mucoid cyst and arthroplasty DIPJ second digit of the left foot. She states that other than being swollen and it seems to be doing pretty good is not very painful.  Objective: Vital signs are stable she is alert and oriented 3 there is mild edema at the surgical site distal aspect of the second digit left foot. Radiographs confirm well healing surgical toe. No erythema cellulitis drainage or odor.  Assessment: Well-healing surgical toe left.  Plan: Redress today with compression dressing and continue to do so until edema has resolved.

## 2016-06-22 DIAGNOSIS — M7521 Bicipital tendinitis, right shoulder: Secondary | ICD-10-CM | POA: Diagnosis not present

## 2016-07-12 DIAGNOSIS — M2550 Pain in unspecified joint: Secondary | ICD-10-CM | POA: Diagnosis not present

## 2016-07-12 DIAGNOSIS — Z6837 Body mass index (BMI) 37.0-37.9, adult: Secondary | ICD-10-CM | POA: Diagnosis not present

## 2016-07-12 DIAGNOSIS — Z79899 Other long term (current) drug therapy: Secondary | ICD-10-CM | POA: Diagnosis not present

## 2016-07-12 DIAGNOSIS — M25552 Pain in left hip: Secondary | ICD-10-CM | POA: Diagnosis not present

## 2016-07-12 DIAGNOSIS — M159 Polyosteoarthritis, unspecified: Secondary | ICD-10-CM | POA: Diagnosis not present

## 2016-07-12 DIAGNOSIS — M542 Cervicalgia: Secondary | ICD-10-CM | POA: Diagnosis not present

## 2016-07-12 DIAGNOSIS — M064 Inflammatory polyarthropathy: Secondary | ICD-10-CM | POA: Diagnosis not present

## 2016-07-12 DIAGNOSIS — M25512 Pain in left shoulder: Secondary | ICD-10-CM | POA: Diagnosis not present

## 2016-07-12 DIAGNOSIS — E669 Obesity, unspecified: Secondary | ICD-10-CM | POA: Diagnosis not present

## 2016-07-13 ENCOUNTER — Telehealth: Payer: Self-pay | Admitting: General Practice

## 2016-07-13 NOTE — Telephone Encounter (Signed)
I spoke with the patient who stated that she has advised Medicare that [name] is her PCP.  She stated that she is due for a wellness visit with Dr. Quay Burow and will be making an appointment soon. Duane Lope ([place])

## 2016-07-27 ENCOUNTER — Other Ambulatory Visit: Payer: Self-pay | Admitting: Emergency Medicine

## 2016-07-27 MED ORDER — LISINOPRIL 40 MG PO TABS: 40.0000 mg | ORAL_TABLET | Freq: Every day | ORAL | 0 refills | Status: DC

## 2016-07-27 MED ORDER — HYDROCHLOROTHIAZIDE 12.5 MG PO CAPS: 12.5000 mg | ORAL_CAPSULE | Freq: Every day | ORAL | 0 refills | Status: DC

## 2016-08-16 ENCOUNTER — Other Ambulatory Visit (INDEPENDENT_AMBULATORY_CARE_PROVIDER_SITE_OTHER): Payer: PPO

## 2016-08-16 ENCOUNTER — Encounter: Payer: Self-pay | Admitting: Internal Medicine

## 2016-08-16 ENCOUNTER — Ambulatory Visit (INDEPENDENT_AMBULATORY_CARE_PROVIDER_SITE_OTHER): Payer: PPO | Admitting: Internal Medicine

## 2016-08-16 VITALS — BP 132/82 | HR 84 | Temp 98.8°F | Resp 16 | Ht 68.0 in | Wt 247.0 lb

## 2016-08-16 DIAGNOSIS — E039 Hypothyroidism, unspecified: Secondary | ICD-10-CM

## 2016-08-16 DIAGNOSIS — I10 Essential (primary) hypertension: Secondary | ICD-10-CM

## 2016-08-16 DIAGNOSIS — Z0000 Encounter for general adult medical examination without abnormal findings: Secondary | ICD-10-CM | POA: Diagnosis not present

## 2016-08-16 DIAGNOSIS — K219 Gastro-esophageal reflux disease without esophagitis: Secondary | ICD-10-CM | POA: Diagnosis not present

## 2016-08-16 DIAGNOSIS — R7303 Prediabetes: Secondary | ICD-10-CM | POA: Diagnosis not present

## 2016-08-16 DIAGNOSIS — E6609 Other obesity due to excess calories: Secondary | ICD-10-CM

## 2016-08-16 DIAGNOSIS — F410 Panic disorder [episodic paroxysmal anxiety] without agoraphobia: Secondary | ICD-10-CM | POA: Diagnosis not present

## 2016-08-16 DIAGNOSIS — E538 Deficiency of other specified B group vitamins: Secondary | ICD-10-CM | POA: Diagnosis not present

## 2016-08-16 DIAGNOSIS — M79601 Pain in right arm: Secondary | ICD-10-CM | POA: Diagnosis not present

## 2016-08-16 DIAGNOSIS — M5010 Cervical disc disorder with radiculopathy, unspecified cervical region: Secondary | ICD-10-CM | POA: Diagnosis not present

## 2016-08-16 DIAGNOSIS — E8940 Asymptomatic postprocedural ovarian failure: Secondary | ICD-10-CM | POA: Insufficient documentation

## 2016-08-16 LAB — COMPREHENSIVE METABOLIC PANEL
ALBUMIN: 4 g/dL (ref 3.5–5.2)
ALK PHOS: 84 U/L (ref 39–117)
ALT: 16 U/L (ref 0–35)
AST: 17 U/L (ref 0–37)
BILIRUBIN TOTAL: 0.4 mg/dL (ref 0.2–1.2)
BUN: 20 mg/dL (ref 6–23)
CALCIUM: 9.3 mg/dL (ref 8.4–10.5)
CO2: 27 mEq/L (ref 19–32)
CREATININE: 0.65 mg/dL (ref 0.40–1.20)
Chloride: 108 mEq/L (ref 96–112)
GFR: 103.96 mL/min (ref >60.00)
Glucose, Bld: 92 mg/dL (ref 70–99)
Potassium: 4.5 mEq/L (ref 3.5–5.1)
Sodium: 141 mEq/L (ref 135–145)
Total Protein: 7 g/dL (ref 6.0–8.3)

## 2016-08-16 LAB — CBC WITH DIFFERENTIAL/PLATELET
BASOS PCT: 0.7 % (ref 0.0–3.0)
Basophils Absolute: 0 10*3/uL (ref 0.0–0.1)
EOS PCT: 2.5 % (ref 0.0–5.0)
Eosinophils Absolute: 0.2 10*3/uL (ref 0.0–0.7)
HEMATOCRIT: 39.4 % (ref 36.0–46.0)
Hemoglobin: 13.2 g/dL (ref 12.0–15.0)
Lymphocytes Relative: 27.4 % (ref 12.0–46.0)
Lymphs Abs: 1.8 10*3/uL (ref 0.7–4.0)
MCHC: 33.4 g/dL (ref 30.0–36.0)
MCV: 84.2 fl (ref 78.0–100.0)
MONO ABS: 0.6 10*3/uL (ref 0.1–1.0)
Monocytes Relative: 8.9 % (ref 3.0–12.0)
Neutro Abs: 4 10*3/uL (ref 1.4–7.7)
Neutrophils Relative %: 60.5 % (ref 43.0–77.0)
PLATELETS: 235 10*3/uL (ref 150.0–400.0)
RBC: 4.68 Mil/uL (ref 3.87–5.11)
RDW: 13.9 % (ref 11.5–15.5)
WBC: 6.6 10*3/uL (ref 4.0–10.5)

## 2016-08-16 LAB — LIPID PANEL
CHOLESTEROL: 153 mg/dL (ref 0–200)
HDL: 55.4 mg/dL (ref >39.00)
LDL Cholesterol: 85 mg/dL (ref 0–99)
NonHDL: 97.2
TRIGLYCERIDES: 61 mg/dL (ref 0.0–149.0)
Total CHOL/HDL Ratio: 3
VLDL: 12.2 mg/dL (ref 0.0–40.0)

## 2016-08-16 LAB — VITAMIN B12: VITAMIN B 12: 463 pg/mL (ref 211–911)

## 2016-08-16 LAB — HEMOGLOBIN A1C: HEMOGLOBIN A1C: 5.9 % (ref 4.6–6.5)

## 2016-08-16 LAB — TSH: TSH: 1.54 u[IU]/mL (ref 0.35–4.50)

## 2016-08-16 MED ORDER — LISINOPRIL 40 MG PO TABS: ORAL_TABLET | ORAL | 3 refills | Status: DC

## 2016-08-16 MED ORDER — HYDROCHLOROTHIAZIDE 12.5 MG PO CAPS: 12.5000 mg | ORAL_CAPSULE | Freq: Every day | ORAL | 3 refills | Status: DC

## 2016-08-16 MED ORDER — CELECOXIB 200 MG PO CAPS: 200.0000 mg | ORAL_CAPSULE | Freq: Two times a day (BID) | ORAL | 0 refills | Status: DC

## 2016-08-16 NOTE — Progress Notes (Signed)
Subjective:    Patient ID: [name], female    DOB: 1970/04/28, 46 y.o.   MRN: [id number]  HPI She is here for a physical exam.   Right arm pain:  She had neck surgery last fall and has had right upper arm pain since then.  She is following with Dr Maryjean Ka for pain management.   She is going to the gym 3-4 times a week.  She has gained weight since surgery.  She eats less than 1500-1600 calories a day.  She does have some cheat days.  Her weight has remained stable.  She is very frustrated and does not understand why she has not lost weight.   Right nostril feels swollen since she did yard work.  She had some green mucus over the weekend.  She takes zyrtec daily.    Medications and allergies reviewed with patient and updated if appropriate.  Patient Active Problem List   Diagnosis Date Noted  . Postsurgical menopause 08/16/2016  . Mucoid cyst of joint 11/04/2015  . External hemorrhoid 08/20/2015  . Encounter for counseling 07/27/2015  . Prediabetes 06/05/2015  . GERD (gastroesophageal reflux disease) 06/04/2015  . Status post bilateral breast reduction 04/01/2015  . Symptomatic mammary hypertrophy 02/03/2015  . Cervical disc disorder with radiculopathy of cervical region 05/23/2014  . Neck pain 02/03/2014  . Ulnar neuropathy 01/16/2014  . Fibromyalgia 01/16/2014  . Recurrent nephrolithiasis 04/29/2013  . Arthralgia 04/29/2013  . Panic attacks 01/15/2013  . IBS (irritable bowel syndrome) 12/14/2011  . Dysuria 11/07/2011  . Premature menopause on hormone replacement therapy 09/02/2011  . B12 deficiency 06/30/2009  . Hypothyroidism 03/02/2007  . Essential hypertension 03/02/2007    Current Outpatient Prescriptions on File Prior to Visit  Medication Sig Dispense Refill  . aspirin 81 MG tablet Take 81 mg by mouth daily.     . celecoxib (CELEBREX) 200 MG capsule Take 1 capsule (200 mg total) by mouth 2 (two) times daily. Yearly physical w/labs due in March 180  capsule 0  . cholecalciferol (VITAMIN D) 1000 UNITS tablet Take 1,000 Units by mouth daily.    . citalopram (CELEXA) 20 MG tablet Take 1 tablet (20 mg total) by mouth daily. 90 tablet 1  . hydrochlorothiazide (MICROZIDE) 12.5 MG capsule Take 1 capsule (12.5 mg total) by mouth daily. 90 capsule 0  . lisinopril (PRINIVIL,ZESTRIL) 40 MG tablet TAKE 1 TABLET EVERY DAY 14 tablet 0  . meperidine (DEMEROL) 50 MG tablet Take one to two capsules by mouth every six to eight hours as needed for pain. 40 tablet 0  . ondansetron (ZOFRAN) 4 MG tablet Take 1 tablet (4 mg total) by mouth every 8 (eight) hours as needed. 20 tablet 0  . vitamin B-12 (CYANOCOBALAMIN) 1000 MCG tablet Take 1 tablet (1,000 mcg total) by mouth daily. 90 tablet 3   No current facility-administered medications on file prior to visit.     Past Medical History:  Diagnosis Date  . Anxiety   . Chronic joint pain   . Depression    during divorce & legal matters  . GERD (gastroesophageal reflux disease)   . History of kidney stones    Dr Phebe Colla  . History of polycystic ovarian disease S/P BSO  . Hx of anxiety disorder   . Hypertension    a  . Hypothyroidism    no meds  . IBS (irritable bowel syndrome)   . Lupus hx positive ANA   followed by Dr Gavin Pound; possible  lupus  . Myalgia   . Osteoarthritis   . 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 POLYCYSTIC OVARIAN DISEASE 03/02/2007   Qualifier: Diagnosis of  By: Linna Darner MD, Rae Mar BSO for cysts & endometriosis; Dr Reino Kent, Gyn Seeing Dr Paula Compton    . PONV (postoperative nausea and vomiting)   . Symptomatic mammary hypertrophy   . URI (upper respiratory infection)    currently on cefdinir    Past Surgical History:  Procedure Laterality Date  . [place]   exploratory lap  . BREAST REDUCTION SURGERY Bilateral 03/26/2015   Procedure: BILATERAL BREAST REDUCTION ;  Surgeon: Wallace Going, DO;  Location: [place];  Service: Plastics;  Laterality: Bilateral;  . CARPAL TUNNEL RELEASE     bilateral; Dr Sherwood Gambler  . COLONOSCOPY     in 1990s  . CYSTO/ RIGHT RETROGRADE URETERAL PYELOGRAM  07-09-2004   HX BILATERAL RENAL STONES/ RIGHT FLANK PAIN  . CYSTOSCOPY W/ URETERAL STENT PLACEMENT  11/12/2011   Procedure: CYSTOSCOPY WITH RETROGRADE PYELOGRAM/URETERAL STENT PLACEMENT;  Surgeon: Alexis Frock, MD;  Location: [facility];  Service: Urology;  Laterality: Right;  . CYSTOSCOPY W/ URETERAL STENT REMOVAL  12/07/2011   Procedure: CYSTOSCOPY WITH STENT REMOVAL;  Surgeon: Alexis Frock, MD;  Location: [facility];  Service: Urology;  Laterality: Right;  . CYSTOSCOPY/RETROGRADE/URETEROSCOPY/STONE EXTRACTION WITH BASKET  12/07/2011   Procedure: CYSTOSCOPY/RETROGRADE/URETEROSCOPY/STONE EXTRACTION WITH BASKET;  Surgeon: Alexis Frock, MD;  Location: [facility];  Service: Urology;  Laterality: Right;  . DILATION AND CURETTAGE OF UTERUS     X5  . EXCISION LEFT BARTHOLIN GLAND  02-05-2002  . EYE SURGERY     Retinal tears surgery bilateral  . LAPAROSCOPIC ASSISTED VAGINAL HYSTERECTOMY  2000  . LAPAROSCOPIC CHOLECYSTECTOMY  05-31-2007  . LAPAROSCOPIC LASER ABLATION ENDOMETRIOSIS AND LEFT SALPINGO-OOPHORECTOMY  11-03-1999  . LAPAROSCOPY WITH RIGHT SALPINGO-OOPHECTOMY/ LYSIS ADHESIONS AND ABLATION ENDOMETRIOSIS  05-17-2001  . LIPOSUCTION Bilateral 03/26/2015   Procedure: WITH LIPOSUCTION;  Surgeon: Wallace Going, DO;  Location: [place];  Service: Plastics;  Laterality: Bilateral;  . LUMBAR LAMINECTOMY  2003   L4 - L5  . PLANTAR FASCIA SURGERY     right  . PLANTAR FASCIA SURGERY  02/2011   left  . RIGHT URETEROSCOPIC STONE EXTRACTION  08-04-2000  . TONSILLECTOMY    . URETERAL STENT PLACEMENT  11/12/11   Dr Tresa Moore    Social History   Social History  . Marital status: Married    Spouse name: Darnelle Maffucci  . Number of children: 0  . Years of education:  college   Occupational History  . disabled    Social History Main Topics  . Smoking status: Never Smoker  . Smokeless tobacco: Never Used  . Alcohol use 0.6 oz/week    1 Shots of liquor per week     Comment:  very rarely  . Drug use: No  . Sexual activity: Yes    Birth control/ protection: Surgical   Other Topics Concern  . Not on file   Social History Narrative   Patient Lives at home with her husband Darnelle Maffucci)   Disabled.   Education two years of college.   Right handed.   Caffeine coffee and sweet tea. Not daily.    Family History  Problem Relation Age of Onset  . Hypertension Mother   . Colon polyps Mother   . Atrial fibrillation Mother  Dr Angelena Form  . Heart attack Father 18  . Diabetes Father   . Hypertension Sister   . Heart attack Paternal Grandmother 61  . Breast cancer Paternal Aunt   . Colon cancer Maternal Uncle   . Colon cancer Paternal Uncle   . Colon polyps Maternal Grandmother   . Stroke Maternal Grandmother 76  . Diabetes Paternal Grandfather   . Stroke Maternal Grandfather 37    Review of Systems  Constitutional: Negative for chills and fever.  HENT: Positive for congestion, postnasal drip, sinus pain (R> L) and sore throat. Negative for ear pain (ears popping).   Eyes: Negative for visual disturbance.  Respiratory: Positive for cough (at night - PND). Negative for shortness of breath and wheezing.   Cardiovascular: Positive for palpitations (occ, transient). Negative for chest pain and leg swelling.  Gastrointestinal: Negative for abdominal pain, blood in stool, constipation, diarrhea and nausea.       No gerd  Genitourinary: Negative for dysuria and hematuria.  Musculoskeletal: Positive for arthralgias.  Skin: Negative for color change and rash.  Neurological: Negative for light-headedness and headaches.  Psychiatric/Behavioral: Negative for dysphoric mood. The patient is nervous/anxious (controlled).        Objective:   Vitals:     08/16/16 0921  BP: 132/82  Pulse: 84  Resp: 16  Temp: 98.8 F (37.1 C)   Filed Weights   08/16/16 0921  Weight: 247 lb (112 kg)   Body mass index is 37.56 kg/m.  Wt Readings from Last 3 Encounters:  08/16/16 247 lb (112 kg)  08/20/15 229 lb (103.9 kg)  07/29/15 236 lb (107 kg)     Physical Exam Constitutional: [name]. No distress.  HENT:  Head: Normocephalic and atraumatic.  Right Ear: External ear normal. Normal ear canal and TM Left Ear: External ear normal.  Normal ear canal and TM Mouth/Throat: Oropharynx is clear and moist.  Eyes: Conjunctivae and EOM are normal.  Neck: Neck supple. No tracheal deviation present. No thyromegaly present.  No carotid bruit  Cardiovascular: Normal rate, regular rhythm and normal heart sounds.   No murmur heard.  No edema. Pulmonary/Chest: Effort normal and breath sounds normal. No respiratory distress. She has no wheezes. She has no rales.  Breast: deferred to Gyn Abdominal: Soft. She exhibits no distension. There is no tenderness.  Lymphadenopathy: She has no cervical adenopathy.  Skin: Skin is warm and dry. She is not diaphoretic.  Psychiatric: She has a normal mood and affect. Her behavior is normal.         Assessment & Plan:   Physical exam: Screening blood work ordered Immunizations  Up to date  Mammogram  Done in December 2017 - dr Richardson's office Gyn   - up to date Eye exams  Up to date  Exercise  regular Weight - discussed weight loss Skin  No concernes Substance abuse  none  See Problem List for Assessment and Plan of chronic medical problems.  FU in 6 months

## 2016-08-16 NOTE — Assessment & Plan Note (Signed)
Not on medication Having difficulty losing weight Check tsh

## 2016-08-16 NOTE — Assessment & Plan Note (Signed)
BP well controlled Current regimen effective and well tolerated Continue current medications at current doses  

## 2016-08-16 NOTE — Assessment & Plan Note (Addendum)
Check B12 level Not currently taking B12

## 2016-08-16 NOTE — Assessment & Plan Note (Signed)
Having nerve pain after surgery in right arm Still has some finger pain Dr Maryjean Ka - pain mangement Taking cymbalta Still has pain in right upper arm - worse with using the arm

## 2016-08-16 NOTE — Assessment & Plan Note (Signed)
Check a1c 

## 2016-08-16 NOTE — Assessment & Plan Note (Addendum)
For the past month has been taking cymbalta, doxepin and celexa Started cymbalta for nerve pain Stop celexa Has not had any recent panic attacks

## 2016-08-16 NOTE — Patient Instructions (Addendum)
Test(s) ordered today. Your results will be released to MyChart (or called to you) after review, usually within 72hours after test completion. If any changes need to be made, you will be notified at that same time.  All other Health Maintenance issues reviewed.   All recommended immunizations and age-appropriate screenings are up-to-date or discussed.  No immunizations administered today.   Medications reviewed and updated.  Changes include discontinuing citalopram.   Your prescription(s) have been submitted to your pharmacy. Please take as directed and contact our office if you believe you are having problem(s) with the medication(s).   Please followup in 6 months   Health Maintenance, Female Adopting a healthy lifestyle and getting preventive care can go a long way to promote health and wellness. Talk with your health care provider about what schedule of regular examinations is right for you. This is a good chance for you to check in with your provider about disease prevention and staying healthy. In between checkups, there are plenty of things you can do on your own. Experts have done a lot of research about which lifestyle changes and preventive measures are most likely to keep you healthy. Ask your health care provider for more information. Weight and diet Eat a healthy diet  Be sure to include plenty of vegetables, fruits, low-fat dairy products, and lean protein.  Do not eat a lot of foods high in solid fats, added sugars, or salt.  Get regular exercise. This is one of the most important things you can do for your health.  Most adults should exercise for at least 150 minutes each week. The exercise should increase your heart rate and make you sweat (moderate-intensity exercise).  Most adults should also do strengthening exercises at least twice a week. This is in addition to the moderate-intensity exercise. Maintain a healthy weight  Body mass index (BMI) is a measurement that  can be used to identify possible weight problems. It estimates body fat based on height and weight. Your health care provider can help determine your BMI and help you achieve or maintain a healthy weight.  For females 68 years of age and older:  A BMI below 18.5 is considered underweight.  A BMI of 18.5 to 24.9 is normal.  A BMI of 25 to 29.9 is considered overweight.  A BMI of 30 and above is considered obese. Watch levels of cholesterol and blood lipids  You should start having your blood tested for lipids and cholesterol at 47 years of age, then have this test every 5 years.  You may need to have your cholesterol levels checked more often if:  Your lipid or cholesterol levels are high.  You are older than 47 years of age.  You are at high risk for heart disease. Cancer screening Lung Cancer  Lung cancer screening is recommended for adults 5-37 years old who are at high risk for lung cancer because of a history of smoking.  A yearly low-dose CT scan of the lungs is recommended for people who:  Currently smoke.  Have quit within the past 15 years.  Have at least a 30-pack-year history of smoking. A pack year is smoking an average of one pack of cigarettes a day for 1 year.  Yearly screening should continue until it has been 15 years since you quit.  Yearly screening should stop if you develop a health problem that would prevent you from having lung cancer treatment. Breast Cancer  Practice breast self-awareness. This means understanding how your  breasts normally appear and feel.  It also means doing regular breast self-exams. Let your health care provider know about any changes, no matter how small.  If you are in your 20s or 30s, you should have a clinical breast exam (CBE) by a health care provider every 1-3 years as part of a regular health exam.  If you are 18 or older, have a CBE every year. Also consider having a breast X-ray (mammogram) every year.  If you  have a family history of breast cancer, talk to your health care provider about genetic screening.  If you are at high risk for breast cancer, talk to your health care provider about having an MRI and a mammogram every year.  Breast cancer gene (BRCA) assessment is recommended for women who have family members with BRCA-related cancers. BRCA-related cancers include:  Breast.  Ovarian.  Tubal.  Peritoneal cancers.  Results of the assessment will determine the need for genetic counseling and BRCA1 and BRCA2 testing. Cervical Cancer  Your health care provider may recommend that you be screened regularly for cancer of the pelvic organs (ovaries, uterus, and vagina). This screening involves a pelvic examination, including checking for microscopic changes to the surface of your cervix (Pap test). You may be encouraged to have this screening done every 3 years, beginning at age 59.  For women ages 40-65, health care providers may recommend pelvic exams and Pap testing every 3 years, or they may recommend the Pap and pelvic exam, combined with testing for human papilloma virus (HPV), every 5 years. Some types of HPV increase your risk of cervical cancer. Testing for HPV may also be done on women of any age with unclear Pap test results.  Other health care providers may not recommend any screening for nonpregnant women who are considered low risk for pelvic cancer and who do not have symptoms. Ask your health care provider if a screening pelvic exam is right for you.  If you have had past treatment for cervical cancer or a condition that could lead to cancer, you need Pap tests and screening for cancer for at least 20 years after your treatment. If Pap tests have been discontinued, your risk factors (such as having a new sexual partner) need to be reassessed to determine if screening should resume. Some women have medical problems that increase the chance of getting cervical cancer. In these cases, your  health care provider may recommend more frequent screening and Pap tests. Colorectal Cancer  This type of cancer can be detected and often prevented.  Routine colorectal cancer screening usually begins at 47 years of age and continues through 47 years of age.  Your health care provider may recommend screening at an earlier age if you have risk factors for colon cancer.  Your health care provider may also recommend using home test kits to check for hidden blood in the stool.  A small camera at the end of a tube can be used to examine your colon directly (sigmoidoscopy or colonoscopy). This is done to check for the earliest forms of colorectal cancer.  Routine screening usually begins at age 21.  Direct examination of the colon should be repeated every 5-10 years through 47 years of age. However, you may need to be screened more often if early forms of precancerous polyps or small growths are found. Skin Cancer  Check your skin from head to toe regularly.  Tell your health care provider about any new moles or changes in moles, especially  if there is a change in a mole's shape or color.  Also tell your health care provider if you have a mole that is larger than the size of a pencil eraser.  Always use sunscreen. Apply sunscreen liberally and repeatedly throughout the day.  Protect yourself by wearing long sleeves, pants, a wide-brimmed hat, and sunglasses whenever you are outside. Heart disease, diabetes, and high blood pressure  High blood pressure causes heart disease and increases the risk of stroke. High blood pressure is more likely to develop in:  People who have blood pressure in the high end of the normal range (130-139/85-89 mm Hg).  People who are overweight or obese.  People who are African American.  If you are 67-33 years of age, have your blood pressure checked every 3-5 years. If you are 98 years of age or older, have your blood pressure checked every year. You should  have your blood pressure measured twice-once when you are at a hospital or clinic, and once when you are not at a hospital or clinic. Record the average of the two measurements. To check your blood pressure when you are not at a hospital or clinic, you can use:  An automated blood pressure machine at a pharmacy.  A home blood pressure monitor.  If you are between 60 years and 40 years old, ask your health care provider if you should take aspirin to prevent strokes.  Have regular diabetes screenings. This involves taking a blood sample to check your fasting blood sugar level.  If you are at a normal weight and have a low risk for diabetes, have this test once every three years after 47 years of age.  If you are overweight and have a high risk for diabetes, consider being tested at a younger age or more often. Preventing infection Hepatitis B  If you have a higher risk for hepatitis B, you should be screened for this virus. You are considered at high risk for hepatitis B if:  You were born in a country where hepatitis B is common. Ask your health care provider which countries are considered high risk.  Your parents were born in a high-risk country, and you have not been immunized against hepatitis B (hepatitis B vaccine).  You have HIV or AIDS.  You use needles to inject street drugs.  You live with someone who has hepatitis B.  You have had sex with someone who has hepatitis B.  You get hemodialysis treatment.  You take certain medicines for conditions, including cancer, organ transplantation, and autoimmune conditions. Hepatitis C  Blood testing is recommended for:  Everyone born from [birth year] through 1966.  Anyone with known risk factors for hepatitis C. Sexually transmitted infections (STIs)  You should be screened for sexually transmitted infections (STIs) including gonorrhea and chlamydia if:  You are sexually active and are younger than 47 years of age.  You are older  than 47 years of age and your health care provider tells you that you are at risk for this type of infection.  Your sexual activity has changed since you were last screened and you are at an increased risk for chlamydia or gonorrhea. Ask your health care provider if you are at risk.  If you do not have HIV, but are at risk, it may be recommended that you take a prescription medicine daily to prevent HIV infection. This is called pre-exposure prophylaxis (PrEP). You are considered at risk if:  You are sexually active and do not  regularly use condoms or know the HIV status of your partner(s).  You take drugs by injection.  You are sexually active with a partner who has HIV. Talk with your health care provider about whether you are at high risk of being infected with HIV. If you choose to begin PrEP, you should first be tested for HIV. You should then be tested every 3 months for as long as you are taking PrEP. Pregnancy  If you are premenopausal and you may become pregnant, ask your health care provider about preconception counseling.  If you may become pregnant, take 400 to 800 micrograms (mcg) of folic acid every day.  If you want to prevent pregnancy, talk to your health care provider about birth control (contraception). Osteoporosis and menopause  Osteoporosis is a disease in which the bones lose minerals and strength with aging. This can result in serious bone fractures. Your risk for osteoporosis can be identified using a bone density scan.  If you are 31 years of age or older, or if you are at risk for osteoporosis and fractures, ask your health care provider if you should be screened.  Ask your health care provider whether you should take a calcium or vitamin D supplement to lower your risk for osteoporosis.  Menopause may have certain physical symptoms and risks.  Hormone replacement therapy may reduce some of these symptoms and risks. Talk to your health care provider about  whether hormone replacement therapy is right for you. Follow these instructions at home:  Schedule regular health, dental, and eye exams.  Stay current with your immunizations.  Do not use any tobacco products including cigarettes, chewing tobacco, or electronic cigarettes.  If you are pregnant, do not drink alcohol.  If you are breastfeeding, limit how much and how often you drink alcohol.  Limit alcohol intake to no more than 1 drink per day for nonpregnant women. One drink equals 12 ounces of beer, 5 ounces of wine, or 1 ounces of hard liquor.  Do not use street drugs.  Do not share needles.  Ask your health care provider for help if you need support or information about quitting drugs.  Tell your health care provider if you often feel depressed.  Tell your health care provider if you have ever been abused or do not feel safe at home. This information is not intended to replace advice given to you by your health care provider. Make sure you discuss any questions you have with your health care provider. Document Released: 10/04/2010 Document Revised: 08/27/2015 Document Reviewed: 12/23/2014 Elsevier Interactive Patient Education  2017 [employer].

## 2016-08-18 ENCOUNTER — Telehealth: Payer: Self-pay | Admitting: Internal Medicine

## 2016-08-18 MED ORDER — AMOXICILLIN-POT CLAVULANATE 875-125 MG PO TABS: 1.0000 | ORAL_TABLET | Freq: Two times a day (BID) | ORAL | 0 refills | Status: DC

## 2016-08-18 MED ORDER — HYDROCHLOROTHIAZIDE 12.5 MG PO CAPS: 12.5000 mg | ORAL_CAPSULE | Freq: Every day | ORAL | 3 refills | Status: DC

## 2016-08-18 MED ORDER — LISINOPRIL 40 MG PO TABS: ORAL_TABLET | ORAL | 3 refills | Status: DC

## 2016-08-18 NOTE — Telephone Encounter (Signed)
Please advise on last 2 messages pt has sent.

## 2016-08-18 NOTE — Telephone Encounter (Signed)
Synthroid is not on her med list?  Yes she needs to stay on it.  Confirm dose and please send in.

## 2016-08-18 NOTE — Telephone Encounter (Signed)
Pt caled regarding the email she sent to Dr. Quay Burow.  Hi Dr. Quay Burow- I use for local pharmacy [facility] please. As for the lab results I was happy to see good results. I do have a question though. You were speaking yesterday about my serontonin levels due to being on (celexa,Cymbalta and Dexopin). Was my serontonin levels checked? If so, which one is it? Yes, I would also like to try the metabolic clinic you were speaking of. Can you refer please? Can they help me there if I was diagnosed before with POS? Just curious! Thank you so much!    She would like a call back with an answer.

## 2016-08-19 NOTE — Telephone Encounter (Signed)
See mychart message.    abx sent.   ? Regarding synthroid

## 2016-08-19 NOTE — Telephone Encounter (Signed)
LVM for pt to call back and discuss.  

## 2016-08-22 ENCOUNTER — Encounter: Payer: Self-pay | Admitting: Internal Medicine

## 2016-08-22 MED ORDER — AZITHROMYCIN 250 MG PO TABS: ORAL_TABLET | ORAL | 0 refills | Status: DC

## 2016-09-02 ENCOUNTER — Encounter: Payer: Self-pay | Admitting: Internal Medicine

## 2016-09-13 ENCOUNTER — Encounter: Payer: Self-pay | Admitting: Family Medicine

## 2016-09-13 ENCOUNTER — Ambulatory Visit: Payer: Self-pay

## 2016-09-13 ENCOUNTER — Ambulatory Visit (INDEPENDENT_AMBULATORY_CARE_PROVIDER_SITE_OTHER): Payer: PPO | Admitting: Family Medicine

## 2016-09-13 VITALS — BP 118/84 | HR 78 | Ht 68.0 in | Wt 245.0 lb

## 2016-09-13 DIAGNOSIS — M79601 Pain in right arm: Secondary | ICD-10-CM

## 2016-09-13 DIAGNOSIS — M19011 Primary osteoarthritis, right shoulder: Secondary | ICD-10-CM | POA: Diagnosis not present

## 2016-09-13 DIAGNOSIS — M19019 Primary osteoarthritis, unspecified shoulder: Secondary | ICD-10-CM | POA: Insufficient documentation

## 2016-09-13 MED ORDER — GABAPENTIN 100 MG PO CAPS: 200.0000 mg | ORAL_CAPSULE | Freq: Every day | ORAL | 3 refills | Status: DC

## 2016-09-13 NOTE — Assessment & Plan Note (Signed)
Patient was given an injection. Daughter to the procedure well. We discussed icing regimen patient was given an injection today and tolerated the procedure well. Discussed which activities to do in which ones to avoid. Patient will try topical anti-inflammatories. Started gabapentin at night. Patient work with Corporate treasurer and home exercises in greater detail.

## 2016-09-13 NOTE — Patient Instructions (Addendum)
Good to see you  Exercises 3 times a week.  Ice 20 minutes 2 times daily. Usually after activity and before bed. Keep hands within peripheral vision Gabapentin 200mg  at night Injected AC joint today  Could consider compression sleeve to arm with a lot of activity  See me again in 4 weeks

## 2016-09-13 NOTE — Progress Notes (Signed)
[name] [address], [place] [address] Phone: [phone] Subjective:    I'm seeing this patient by the request  of:    CC: right arm pain    VVO:[id number]  [name] is a 46 y.o. female coming in with complaint of right arm pain Patient is had this pain for quite some time. In 2017 patient did unfortunately have a have cervical surgery. Since then patient has had multiple right arm pain. Describes the pain is more in the humerus. Since that is almost constant. Describes the pain as a dull, throbbing aching sensation. Patient states that sometimes it seems to radiate up towards her job and sometimes down towards her hand but not all the time. Does not notice weakness but can wake her up at night. Patient has gone to pain management and has recently increased Cymbalta to 60 mg. Has not notice any significant improvement at this time.    Past Medical History:  Diagnosis Date  . Anxiety   . Chronic joint pain   . Depression    during divorce & legal matters  . GERD (gastroesophageal reflux disease)   . History of kidney stones    Dr Phebe Colla  . History of polycystic ovarian disease S/P BSO  . Hx of anxiety disorder   . Hypertension    a  . Hypothyroidism    no meds  . IBS (irritable bowel syndrome)   . Lupus hx positive ANA   followed by Dr Gavin Pound; possible lupus  . Myalgia   . Osteoarthritis   . 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 POLYCYSTIC OVARIAN DISEASE 03/02/2007   Qualifier: Diagnosis of  By: Linna Darner MD, Rae Mar BSO for cysts & endometriosis; Dr Reino Kent, Gyn Seeing Dr Paula Compton    . PONV (postoperative nausea and vomiting)   . Symptomatic mammary hypertrophy   . URI (upper respiratory infection)    currently on cefdinir   Past Surgical History:  Procedure Laterality Date  . [place]   exploratory lap  . BREAST REDUCTION SURGERY Bilateral 03/26/2015   Procedure: BILATERAL BREAST REDUCTION ;  Surgeon: Wallace Going, DO;  Location: [facility];  Service: Plastics;  Laterality: Bilateral;  . CARPAL TUNNEL RELEASE     bilateral; Dr Sherwood Gambler  . COLONOSCOPY     in 1990s  . CYSTO/ RIGHT RETROGRADE URETERAL PYELOGRAM  07-09-2004   HX BILATERAL RENAL STONES/ RIGHT FLANK PAIN  . CYSTOSCOPY W/ URETERAL STENT PLACEMENT  11/12/2011   Procedure: CYSTOSCOPY WITH RETROGRADE PYELOGRAM/URETERAL STENT PLACEMENT;  Surgeon: Alexis Frock, MD;  Location: [facility];  Service: Urology;  Laterality: Right;  . CYSTOSCOPY W/ URETERAL STENT REMOVAL  12/07/2011   Procedure: CYSTOSCOPY WITH STENT REMOVAL;  Surgeon: Alexis Frock, MD;  Location: [facility];  Service: Urology;  Laterality: Right;  . CYSTOSCOPY/RETROGRADE/URETEROSCOPY/STONE EXTRACTION WITH BASKET  12/07/2011   Procedure: CYSTOSCOPY/RETROGRADE/URETEROSCOPY/STONE EXTRACTION WITH BASKET;  Surgeon: Alexis Frock, MD;  Location: [facility];  Service: Urology;  Laterality: Right;  . DILATION AND CURETTAGE OF UTERUS     X5  . EXCISION LEFT BARTHOLIN GLAND  02-05-2002  . EYE SURGERY     Retinal tears surgery bilateral  . LAPAROSCOPIC ASSISTED VAGINAL HYSTERECTOMY  2000  . LAPAROSCOPIC CHOLECYSTECTOMY  05-31-2007  . LAPAROSCOPIC LASER ABLATION ENDOMETRIOSIS AND LEFT SALPINGO-OOPHORECTOMY  11-03-1999  . LAPAROSCOPY WITH RIGHT SALPINGO-OOPHECTOMY/ LYSIS ADHESIONS  AND ABLATION ENDOMETRIOSIS  05-17-2001  . LIPOSUCTION Bilateral 03/26/2015   Procedure: WITH LIPOSUCTION;  Surgeon: Wallace Going, DO;  Location: [facility];  Service: Plastics;  Laterality: Bilateral;  . LUMBAR LAMINECTOMY  2003   L4 - L5  . PLANTAR FASCIA SURGERY     right  . PLANTAR FASCIA SURGERY  02/2011   left  . RIGHT URETEROSCOPIC STONE EXTRACTION  08-04-2000  . TONSILLECTOMY    . URETERAL STENT PLACEMENT  11/12/11   Dr Tresa Moore   Social History   Social History  . Marital  status: Married    Spouse name: Darnelle Maffucci  . Number of children: 0  . Years of education: college   Occupational History  . disabled    Social History Main Topics  . Smoking status: Never Smoker  . Smokeless tobacco: Never Used  . Alcohol use 0.6 oz/week    1 Shots of liquor per week     Comment:  very rarely  . Drug use: No  . Sexual activity: Yes    Birth control/ protection: Surgical   Other Topics Concern  . None   Social History Narrative   Patient Lives at home with her husband Darnelle Maffucci)   Disabled.   Education two years of college.   Right handed.   Caffeine coffee and sweet tea. Not daily.   Allergies  Allergen Reactions  . Chloraprep One Step [Chlorhexidine Gluconate] Rash    Developed severe rash where chloraprep was used on chest area  . Ivp Dye [Iodinated Diagnostic Agents] Rash and Other (See Comments)    Flushing, minor facial rash & dyspnea  . Nalbuphine Shortness Of Breath and Rash    Nubain caused respiratory distress & rash  . Septra [Sulfamethoxazole-Trimethoprim] Shortness Of Breath and Rash  . Sulfamethoxazole-Trimethoprim Rash    rash  . Tylox [Oxycodone-Acetaminophen] Rash   Family History  Problem Relation Age of Onset  . Hypertension Mother   . Colon polyps Mother   . Atrial fibrillation Mother        Dr Angelena Form  . Heart attack Father 34  . Diabetes Father   . Hypertension Sister   . Heart attack Paternal Grandmother 40  . Breast cancer Paternal Aunt   . Colon cancer Maternal Uncle   . Colon cancer Paternal Uncle   . Colon polyps Maternal Grandmother   . Stroke Maternal Grandmother 76  . Diabetes Paternal Grandfather   . Stroke Maternal Grandfather 37    Past medical history, social, surgical and family history all reviewed in electronic medical record.  No pertanent information unless stated regarding to the chief complaint.   Review of Systems:Review of systems updated and as accurate as of 09/13/16  No  visual changes, nausea,  vomiting, diarrhea, constipation, dizziness, abdominal pain, skin rash, fevers, chills, night sweats, weight loss, swollen lymph nodes,chest pain, shortness of breath, mood changes.  Positive muscle aches, body aches, headaches  Objective  Blood pressure 118/84, pulse 78, height 5\' 8"  (1.727 m), weight 245 lb (111.1 kg). Systems examined below as of 09/13/16   General: No apparent distress alert and oriented x3 mood and affect normal, dressed appropriately.  HEENT: Pupils equal, extraocular movements intact  Respiratory: Patient's speak in full sentences and does not appear short of breath  Cardiovascular: No lower extremity edema, non tender, no erythema  Skin: Warm dry intact with no signs of infection or rash on extremities or on axial skeleton.  Abdomen: Soft nontender  Neuro: Cranial nerves II  through XII are intact, neurovascularly intact in all extremities with 2+ DTRs and 2+ pulses.  Lymph: No lymphadenopathy of posterior or anterior cervical chain or axillae bilaterally.  Gait normal with good balance and coordination.  MSK:  Mild tender with full range of motion and good stability and symmetric strength and tone of  elbows, wrist, hip, knee and ankles bilaterally.  Neck: Inspection reveals mild loss of lordosis No palpable stepoffs. Negative Spurling's maneuver. Mild decrease in range of motion of the neck. Grip strength and sensation normal in bilateral hands Strength good C4 to T1 distribution No sensory change to C4 to T1 Negative Hoffman sign bilaterally Reflexes normal  Shoulder: Right Inspection reveals no abnormalities, atrophy or asymmetry. Patient does have some fullness of the humerus compared to the contralateral side but no bony abnormality noted. Potential lipoma is felt distally he is approximate 3 cm proximal to the elbow Palpation is normal with no tenderness over AC joint or bicipital groove. ROM is full in all planes. Rotator cuff strength normal  throughout. No signs of impingement with negative Neer and Hawkin's tests, empty can sign. Speeds and Yergason's tests normal. Positive crossover test Normal scapular function observed. No painful arc and no drop arm sign. No apprehension sign Contralateral shoulder unremarkable  MSK US performed of: Right shoulder This study was ordered, performed, and interpreted by Charlann Boxer D.O.  Shoulder:   Supraspinatus:  Mild degenerative changes noted Subscapularis:  Mild degenerative changes noted. Teres Minor:  Appears normal on long and transverse views. AC joint:  Severe arthritic changes with capsulitis noted. Glenohumeral Joint:  Appears normal without effusion. Biceps Tendon:  Appears normal on long and transverse views, no fraying of tendon, tendon located in intertubercular groove, no subluxation with shoulder internal or external rotation. No increased power doppler signal. Area on distal humerus and be more of a lipoma. 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  Verbal informed consent obtained.  Time-out conducted.  Noted no overlying erythema, induration, or other signs of local infection.  Skin prepped in a sterile fashion.  Local anesthesia: Topical Ethyl chloride.  With sterile technique and under real time ultrasound guidance:  With a 25-gauge 1 and she will patient injected with a total of 0.5 mL of 0.5% Marcaine and 0.5 mL of Kenalog 40 mg/dL Completed without difficulty  Pain immediately resolved suggesting accurate placement of the medication.  Advised to call if  fevers/chills, erythema, induration, drainage, or persistent bleeding.  Images permanently stored and available for review in the ultrasound unit.  Impression: Technically successful ultrasound guided injection.  Procedure note 52841; 15 minutes spent for Therapeutic exercises as stated in above notes.  This included exercises focusing on stretching, strengthening, with significant focus on eccentric aspects.   Shoulder Exercises that included:  Basic scapular stabilization to include adduction and depression of scapula Scaption, focusing on proper movement and good control Internal and External rotation utilizing a theraband, with elbow tucked at side entire time Rows with theraband  Proper technique shown and discussed handout in great detail with ATC.  All questions were discussed and answered.     Impression and Recommendations:     This case required medical decision making of moderate  complexity.      Note: This dictation was prepared with Dragon dictation along with smaller phrase technology. Any transcriptional errors that result from this process are unintentional.

## 2016-09-20 DIAGNOSIS — M47816 Spondylosis without myelopathy or radiculopathy, lumbar region: Secondary | ICD-10-CM | POA: Diagnosis not present

## 2016-09-22 ENCOUNTER — Encounter (INDEPENDENT_AMBULATORY_CARE_PROVIDER_SITE_OTHER): Payer: PPO | Admitting: Family Medicine

## 2016-09-28 ENCOUNTER — Ambulatory Visit (INDEPENDENT_AMBULATORY_CARE_PROVIDER_SITE_OTHER): Payer: PPO | Admitting: Family Medicine

## 2016-09-28 ENCOUNTER — Encounter (INDEPENDENT_AMBULATORY_CARE_PROVIDER_SITE_OTHER): Payer: Self-pay | Admitting: Family Medicine

## 2016-09-28 VITALS — BP 137/84 | HR 79 | Temp 98.8°F | Resp 15 | Ht 68.0 in | Wt 241.0 lb

## 2016-09-28 DIAGNOSIS — R0602 Shortness of breath: Secondary | ICD-10-CM | POA: Diagnosis not present

## 2016-09-28 DIAGNOSIS — R5383 Other fatigue: Secondary | ICD-10-CM

## 2016-09-28 DIAGNOSIS — Z1389 Encounter for screening for other disorder: Secondary | ICD-10-CM

## 2016-09-28 DIAGNOSIS — Z0289 Encounter for other administrative examinations: Secondary | ICD-10-CM

## 2016-09-28 DIAGNOSIS — I10 Essential (primary) hypertension: Secondary | ICD-10-CM | POA: Diagnosis not present

## 2016-09-28 DIAGNOSIS — R739 Hyperglycemia, unspecified: Secondary | ICD-10-CM

## 2016-09-28 DIAGNOSIS — E669 Obesity, unspecified: Secondary | ICD-10-CM

## 2016-09-28 DIAGNOSIS — Z6836 Body mass index (BMI) 36.0-36.9, adult: Secondary | ICD-10-CM | POA: Diagnosis not present

## 2016-09-28 DIAGNOSIS — Z1331 Encounter for screening for depression: Secondary | ICD-10-CM

## 2016-09-28 NOTE — Progress Notes (Signed)
Office: [phone]  /  Fax: [phone]   Dear Dr. Quay Burow,   Thank you for referring [name] to our clinic. The following note includes my evaluation and treatment recommendations.  HPI:   Chief Complaint: OBESITY  [name] has been referred by Binnie Rail, MD for consultation regarding her obesity and obesity related comorbidities.  [name] (MR# [id number]) is a 46 y.o. female who presents on 09/28/2016 for obesity evaluation and treatment. Current BMI is Body mass index is 36.64 kg/m.[name] has struggled with obesity for years and has been unsuccessful in either losing weight or maintaining long term weight loss. She has been trying to eat healthy but cannot seem to lose weight. [name] attended our information session and states she is currently in the action stage of change and ready to dedicate time achieving and maintaining a healthier weight.  [name] states her family eats meals together she thinks her family will eat healthier with  her her desired weight loss is 72 lbs she has been heavy most of  her life she started gaining weight after hysterectomy in 2000 her heaviest weight ever was 258.6 lbs. she has significant food cravings issues  she skips meals frequently she is frequently drinking liquids with calories she frequently makes poor food choices she has problems with excessive hunger  she has binge eating behaviors she struggles with emotional eating    Fatigue [name] feels her energy is lower than it should be. This has worsened with weight gain and has not worsened recently. [name] admits to daytime somnolence and  admits to waking up still tired. Patient is at risk for obstructive sleep apnea. [name] has a history of symptoms of daytime fatigue, morning fatigue and hypertension. Patient generally gets 5 or 6 hours of sleep per night, and states they generally have restless sleep. Snoring is present. Apneic  episodes are not present. Epworth Sleepiness Score is 11  Dyspnea on exertion [name] notes increasing shortness of breath with exercising and seems to be worsening over time with weight gain. She notes getting out of breath sooner with activity than she used to. This has not gotten worse recently. [name] denies orthopnea.  Hypertension [name] is a 46 y.o. female with hypertension. Her blood pressure is stable on medications. [name] denies chest pain or headache. She is working weight loss to help control her blood pressure with the goal of decreasing her risk of heart attack and stroke. [name] blood pressure is currently controlled.  Hyperglycemia [name] has a history of some elevated blood glucose readings without a diagnosis of diabetes. Her last A1c was at 5.9 and she has a positive family history of diabetes. She admits to polyphagia.  Depression Screen [name]'s Food and Mood (modified PHQ-9) score was  Depression screen PHQ 2/9 09/28/2016  Decreased Interest 2  Down, Depressed, Hopeless 1  PHQ - 2 Score 3  Altered sleeping 1  Tired, decreased energy 2  Change in appetite 1  Feeling bad or failure about yourself  1  Trouble concentrating 2  Moving slowly or fidgety/restless 1  Suicidal thoughts 0  PHQ-9 Score 11  Some recent data might be hidden    ALLERGIES: Allergies  Allergen Reactions   Chloraprep One Step [Chlorhexidine Gluconate] Rash    Developed severe rash where chloraprep was used on chest area   Ivp Dye [Iodinated Diagnostic Agents] Rash and Other (See Comments)    Flushing, minor facial rash & dyspnea  Nalbuphine Shortness Of Breath and Rash    Nubain caused respiratory distress & rash   Septra [Sulfamethoxazole-Trimethoprim] Shortness Of Breath and Rash   Sulfamethoxazole-Trimethoprim Rash    rash   Tylox [Oxycodone-Acetaminophen] Rash    MEDICATIONS: Current Outpatient Prescriptions on File Prior to Visit    Medication Sig Dispense Refill   aspirin 81 MG tablet Take 81 mg by mouth daily.      celecoxib (CELEBREX) 200 MG capsule Take 1 capsule (200 mg total) by mouth 2 (two) times daily. 180 capsule 0   cholecalciferol (VITAMIN D) 1000 UNITS tablet Take 1,000 Units by mouth daily.     DOXEPIN HCL PO Take by mouth at bedtime.     DULoxetine (CYMBALTA) 60 MG capsule Take 60 mg by mouth 2 (two) times daily.     gabapentin (NEURONTIN) 100 MG capsule Take 2 capsules (200 mg total) by mouth at bedtime. 60 capsule 3   hydrochlorothiazide (MICROZIDE) 12.5 MG capsule Take 1 capsule (12.5 mg total) by mouth daily. 90 capsule 3   lisinopril (PRINIVIL,ZESTRIL) 40 MG tablet TAKE 1 TABLET EVERY DAY 90 tablet 3   No current facility-administered medications on file prior to visit.     PAST MEDICAL HISTORY: Past Medical History:  Diagnosis Date   Anemia    Anxiety    Arthritis    inflammitory   Asthma    B12 deficiency    Back pain    Chronic joint pain    Constipation    DDD (degenerative disc disease), lumbar    low back and neck and shoulders   Depression    during divorce & legal matters   Fatty liver    Fibromyalgia    Gallbladder problem    GERD (gastroesophageal reflux disease)    History of kidney stones    Dr Phebe Colla   History of polycystic ovarian disease S/P BSO   Hx of anxiety disorder    Hypertension    a   Hypothyroidism    no meds   IBS (irritable bowel syndrome)    IBS (irritable bowel syndrome)    Infertility, female    Joint pain    Kidney problem    Lactose intolerance    Lupus hx positive ANA   followed by Dr Gavin Pound; possible lupus   Multiple food allergies    Myalgia    Osteoarthritis    Polyarthritis, 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[facility])    POLYCYSTIC OVARIAN DISEASE 03/02/2007   Qualifier: Diagnosis of  By: Linna Darner MD, Rae Mar BSO for cysts & endometriosis; Dr Reino Kent, Gyn Seeing Dr Paula Compton     PONV (postoperative  nausea and vomiting)    Prediabetes    Sweating profusely    Symptomatic mammary hypertrophy    URI (upper respiratory infection)    currently on cefdinir    PAST SURGICAL HISTORY: Past Surgical History:  Procedure Laterality Date   ANKLE SURGERY     X3   APPENDECTOMY  1991   exploratory lap   BREAST REDUCTION SURGERY Bilateral 03/26/2015   Procedure: BILATERAL BREAST REDUCTION ;  Surgeon: Wallace Going, DO;  Location: [place];  Service: Plastics;  Laterality: Bilateral;   CARPAL TUNNEL RELEASE     bilateral; Dr Sherwood Gambler   COLONOSCOPY     in [place]  07-09-2004   HX BILATERAL RENAL STONES/ RIGHT FLANK PAIN   CYSTOSCOPY W/ URETERAL STENT PLACEMENT  11/12/2011   Procedure: CYSTOSCOPY  WITH RETROGRADE PYELOGRAM/URETERAL STENT PLACEMENT;  Surgeon: Alexis Frock, MD;  Location: [facility];  Service: Urology;  Laterality: Right;   CYSTOSCOPY W/ URETERAL STENT REMOVAL  12/07/2011   Procedure: CYSTOSCOPY WITH STENT REMOVAL;  Surgeon: Alexis Frock, MD;  Location: [facility];  Service: Urology;  Laterality: Right;   CYSTOSCOPY/RETROGRADE/URETEROSCOPY/STONE EXTRACTION WITH BASKET  12/07/2011   Procedure: CYSTOSCOPY/RETROGRADE/URETEROSCOPY/STONE EXTRACTION WITH BASKET;  Surgeon: Alexis Frock, MD;  Location: [facility];  Service: Urology;  Laterality: Right;   DILATION AND CURETTAGE OF UTERUS     X5   EXCISION LEFT BARTHOLIN GLAND  02-05-2002   EYE SURGERY     Retinal tears surgery bilateral   LAPAROSCOPIC ASSISTED VAGINAL HYSTERECTOMY  2000   LAPAROSCOPIC CHOLECYSTECTOMY  05-31-2007   LAPAROSCOPIC LASER ABLATION ENDOMETRIOSIS AND LEFT SALPINGO-OOPHORECTOMY  11-03-1999   LAPAROSCOPY WITH RIGHT SALPINGO-OOPHECTOMY/ LYSIS ADHESIONS AND ABLATION ENDOMETRIOSIS  05-17-2001   LIPOSUCTION Bilateral 03/26/2015   Procedure: WITH LIPOSUCTION;  Surgeon: Wallace Going, DO;  Location:  [place];  Service: Plastics;  Laterality: Bilateral;   LUMBAR LAMINECTOMY  2003   L4 - L5   PLANTAR FASCIA SURGERY     right   PLANTAR FASCIA SURGERY  02/2011   left   RIGHT URETEROSCOPIC STONE EXTRACTION  08-04-2000   TONSILLECTOMY     URETERAL STENT PLACEMENT  11/12/11   Dr Tresa Moore    SOCIAL HISTORY: Social History  Substance Use Topics   Smoking status: Never Smoker   Smokeless tobacco: Never Used   Alcohol use 0.6 oz/week    1 Shots of liquor per week     Comment:  very rarely    FAMILY HISTORY: Family History  Problem Relation Age of Onset   Hypertension Mother    Colon polyps Mother    Atrial fibrillation Mother        Dr Angelena Form   Hyperlipidemia Mother    Heart disease Mother    Anxiety disorder Mother    Sleep apnea Mother    Obesity Mother    Heart attack Father 26   Diabetes Father    Hypertension Father    Hyperlipidemia Father    Heart disease Father    Stroke Father    Obesity Father    Hypertension Sister    Heart attack Paternal Grandmother 16   Breast cancer Paternal Aunt    Colon cancer Maternal Uncle    Colon cancer Paternal Uncle    Colon polyps Maternal Grandmother    Stroke Maternal Grandmother 76   Diabetes Paternal Grandfather    Stroke Maternal Grandfather 37    ROS: Review of Systems  Constitutional: Positive for malaise/fatigue.  HENT:       Dry mouth  Eyes:       Wear Glasses or Contacts  Respiratory: Positive for shortness of breath (on exertion).   Cardiovascular: Negative for chest pain and orthopnea.       Leg cramping  Musculoskeletal: Positive for back pain, joint pain and neck pain.       Neck stiffness Muscle stiffness Red or Swollen Joints  Skin: Positive for itching and rash.       dryness  Neurological: Positive for weakness. Negative for headaches.  Endo/Heme/Allergies:       Polyphagia  Psychiatric/Behavioral: The patient has insomnia.        Stress     PHYSICAL EXAM: Blood pressure 137/84, pulse 79, temperature 98.8 F (37.1 C), temperature source Oral, resp. rate 15, height 5'  8" (1.727 m), weight 241 lb (109.3 kg), SpO2 99 %. Body mass index is 36.64 kg/m. Physical Exam  Constitutional: She is oriented to person, place, and time. [name].  Cardiovascular: Normal rate.   Pulmonary/Chest: Effort normal.  Musculoskeletal: Normal range of motion.  Neurological: She is oriented to person, place, and time.  Skin: Skin is warm and dry.  Psychiatric: She has a normal mood and affect. Her behavior is normal.  Vitals reviewed.   RECENT LABS AND TESTS: BMET    Component Value Date/Time   NA 141 08/16/2016 1011   K 4.5 08/16/2016 1011   CL 108 08/16/2016 1011   CO2 27 08/16/2016 1011   GLUCOSE 92 08/16/2016 1011   BUN 20 08/16/2016 1011   CREATININE 0.65 08/16/2016 1011   CALCIUM 9.3 08/16/2016 1011   GFRNONAA >60 04/19/2015 0650   GFRAA >60 04/19/2015 0650   Lab Results  Component Value Date   HGBA1C 5.9 08/16/2016   No results found for: INSULIN CBC    Component Value Date/Time   WBC 6.6 08/16/2016 1011   RBC 4.68 08/16/2016 1011   HGB 13.2 08/16/2016 1011   HCT 39.4 08/16/2016 1011   PLT 235.0 08/16/2016 1011   MCV 84.2 08/16/2016 1011   MCH 27.0 04/19/2015 0650   MCHC 33.4 08/16/2016 1011   RDW 13.9 08/16/2016 1011   LYMPHSABS 1.8 08/16/2016 1011   MONOABS 0.6 08/16/2016 1011   EOSABS 0.2 08/16/2016 1011   BASOSABS 0.0 08/16/2016 1011   Iron/TIBC/Ferritin/ %Sat No results found for: IRON, TIBC, FERRITIN, IRONPCTSAT Lipid Panel     Component Value Date/Time   CHOL 153 08/16/2016 1011   TRIG 61.0 08/16/2016 1011   HDL 55.40 08/16/2016 1011   CHOLHDL 3 08/16/2016 1011   VLDL 12.2 08/16/2016 1011   LDLCALC 85 08/16/2016 1011   Hepatic Function Panel     Component Value Date/Time   PROT 7.0 08/16/2016 1011   ALBUMIN 4.0 08/16/2016 1011   AST 17 08/16/2016 1011    ALT 16 08/16/2016 1011   ALKPHOS 84 08/16/2016 1011   BILITOT 0.4 08/16/2016 1011   BILIDIR 0.1 06/12/2013 1632   IBILI 0.2 06/11/2009 2309      Component Value Date/Time   TSH 1.54 08/16/2016 1011   TSH 2.40 06/04/2015 0857   TSH 2.45 02/16/2015 1706    ECG  shows NSR with a rate of 74 BPM INDIRECT CALORIMETER done today shows a VO2 of 302 and a REE of 2104.    ASSESSMENT AND PLAN: Other fatigue - Plan: EKG 12-Lead, Comprehensive metabolic panel, VITAMIN D 25 Hydroxy (Vit-D Deficiency, Fractures)  SOB (shortness of breath) on exertion  Essential hypertension - Plan: Comprehensive metabolic panel  Hyperglycemia - Plan: Hemoglobin A1c, Insulin, random  Depression screening  Class 2 obesity without serious comorbidity with body mass index (BMI) of 36.0 to 36.9 in adult, unspecified obesity type  PLAN: Fatigue Radiance was informed that her fatigue may be related to obesity, depression or many other causes. Labs will be ordered, and in the meanwhile Yumi has agreed to work on diet, exercise and weight loss to help with fatigue. Proper sleep hygiene was discussed including the need for 7-8 hours of quality sleep each night. A sleep study was not ordered based on symptoms and Epworth score.  Dyspnea on exertion Devonia's shortness of breath appears to be obesity related and exercise induced. She has agreed to work on weight loss and gradually increase exercise to treat  her exercise induced shortness of breath. If [name] follows our instructions and loses weight without improvement of her shortness of breath, we will plan to refer to pulmonology. We will monitor this condition regularly. Anthony agrees to this plan.  Hypertension We discussed sodium restriction, working on healthy weight loss, and a regular exercise program as the means to achieve improved blood pressure control. Berkleigh agreed with this plan and agreed to follow up as directed. We will check labs and  continue to monitor her blood pressure as well as her progress with the above lifestyle modifications. She will continue her medications as prescribed and will watch for signs of hypotension as she continues her lifestyle modifications.  Hyperglycemia Fasting labs will be obtained and results with be discussed with [name] in 2 weeks at her follow up visit. In the meanwhile Pete was started on a lower simple carbohydrate diet and will work on weight loss efforts.  Depression Screen Lynna had a moderately positive depression screening. Depression is commonly associated with obesity and often results in emotional eating behaviors. We will monitor this closely and work on CBT to help improve the non-hunger eating patterns. Referral to Psychology may be required if no improvement is seen as she continues in our clinic.  Obesity Siya is currently in the action stage of change and her goal is to continue with weight loss efforts. I recommend Mercedies begin the structured treatment plan as follows:  She has agreed to follow the Category 3 plan [place] has been instructed to eventually work up to a goal of 150 minutes of combined cardio and strengthening exercise per week for weight loss and overall health benefits. We discussed the following Behavioral Modification Strategies today: meal planning & cooking strategies, increasing lean protein intake and decreasing simple carbohydrates   Seana has agreed to join our obesity program and follow up with our clinic in 2 weeks. She was informed of the importance of frequent follow up visits to maximize her success with intensive lifestyle modifications for her multiple health conditions. She was informed we would discuss her lab results at her next visit unless there is a critical issue that needs to be addressed sooner. Kalyiah agreed to keep her next visit at the agreed upon time to discuss these results.  I, Doreene Nest, am acting as  transcriptionist for Dennard Nip, MD  I have reviewed the above documentation for accuracy and completeness, and I agree with the above. -Dennard Nip, MD  OBESITY BEHAVIORAL INTERVENTION VISIT  Today's visit was # 1 out of 26.  Starting weight: 241 lbs Starting date: 09/28/16 Today's weight : 241 lbs Today's date: 09/28/2016 Total lbs lost to date: 0 (Patients must lose 7 lbs in the first 6 months to continue with counseling)   ASK: We discussed the diagnosis of obesity with [place] today and Dianelys agreed to give [place] permission to discuss obesity behavioral modification therapy today.  ASSESS: Johni has the diagnosis of obesity and her BMI today is 36.7 Lashann is in the action stage of change   ADVISE: Jessicaann was educated on the multiple health risks of obesity as well as the benefit of weight loss to improve her health. She was advised of the need for long term treatment and the importance of lifestyle modifications.  AGREE: Multiple dietary modification options and treatment options were discussed and  Kaleigh agreed to follow the Category 3 plan We discussed the following Behavioral Modification Strategies today: meal planning & cooking strategies, increasing lean  protein intake and decreasing simple carbohydrates

## 2016-09-29 LAB — COMPREHENSIVE METABOLIC PANEL
A/G RATIO: 1.5 (ref 1.2–2.2)
ALBUMIN: 4.1 g/dL (ref 3.5–5.5)
ALK PHOS: 115 IU/L (ref 39–117)
ALT: 39 IU/L — ABNORMAL HIGH (ref 0–32)
AST: 30 IU/L (ref 0–40)
BUN / CREAT RATIO: 30 — AB (ref 9–23)
BUN: 19 mg/dL (ref 6–24)
Bilirubin Total: 0.4 mg/dL (ref 0.0–1.2)
CO2: 26 mmol/L (ref 20–29)
Calcium: 9 mg/dL (ref 8.7–10.2)
Chloride: 104 mmol/L (ref 96–106)
Creatinine, Ser: 0.64 mg/dL (ref 0.57–1.00)
GFR calc Af Amer: 124 mL/min/{1.73_m2} (ref >59)
GFR, EST NON AFRICAN AMERICAN: 107 mL/min/{1.73_m2} (ref >59)
GLOBULIN, TOTAL: 2.7 g/dL (ref 1.5–4.5)
Glucose: 83 mg/dL (ref 65–99)
POTASSIUM: 4.4 mmol/L (ref 3.5–5.2)
SODIUM: 141 mmol/L (ref 134–144)
Total Protein: 6.8 g/dL (ref 6.0–8.5)

## 2016-09-29 LAB — HEMOGLOBIN A1C
ESTIMATED AVERAGE GLUCOSE: 114 mg/dL
HEMOGLOBIN A1C: 5.6 % (ref 4.8–5.6)

## 2016-09-29 LAB — INSULIN, RANDOM: INSULIN: 14.7 u[IU]/mL (ref 2.6–24.9)

## 2016-09-29 LAB — VITAMIN D 25 HYDROXY (VIT D DEFICIENCY, FRACTURES): VIT D 25 HYDROXY: 44.2 ng/mL (ref 30.0–100.0)

## 2016-10-11 ENCOUNTER — Ambulatory Visit: Payer: Self-pay

## 2016-10-11 ENCOUNTER — Ambulatory Visit (INDEPENDENT_AMBULATORY_CARE_PROVIDER_SITE_OTHER): Payer: PPO | Admitting: Family Medicine

## 2016-10-11 ENCOUNTER — Encounter: Payer: Self-pay | Admitting: Family Medicine

## 2016-10-11 VITALS — BP 128/80 | HR 72 | Ht 68.0 in | Wt 243.0 lb

## 2016-10-11 DIAGNOSIS — G8929 Other chronic pain: Secondary | ICD-10-CM | POA: Diagnosis not present

## 2016-10-11 DIAGNOSIS — M25511 Pain in right shoulder: Secondary | ICD-10-CM | POA: Diagnosis not present

## 2016-10-11 DIAGNOSIS — M5010 Cervical disc disorder with radiculopathy, unspecified cervical region: Secondary | ICD-10-CM | POA: Diagnosis not present

## 2016-10-11 MED ORDER — VITAMIN D (ERGOCALCIFEROL) 1.25 MG (50000 UNIT) PO CAPS: 50000.0000 [IU] | ORAL_CAPSULE | ORAL | 0 refills | Status: DC

## 2016-10-11 NOTE — Assessment & Plan Note (Signed)
Injected today. Tolerated the procedure well. Discussed icing regimen and home exercises. Discussed objective is a doing which ones to avoid. She'll increase activity as tolerated. Hopefully patient does well with conservative therapy.

## 2016-10-11 NOTE — Patient Instructions (Addendum)
Good to see you  Once weekly vitamin D for 12 weeks.  Over the counter get  B12 1088mcg daily  B6 20mg  daily  Tart cherry extract any dose at night Turmeric 500mg  twice daily  See me again in 4-6 weeks

## 2016-10-11 NOTE — Assessment & Plan Note (Signed)
Concern some of the pain is still secondary to patient's postsurgical changes. We discussed over-the-counter medications a could be beneficial. Discussed the possibility of increasing gabapentin which she declined at this time. Declined any type of other home exercises. Does have fibromyalgia which also complicates the picture. Attempted an injection in the shoulder to see if this will be beneficial. Follow-up again in 6 weeks.

## 2016-10-11 NOTE — Progress Notes (Signed)
[name] [address] [place], [place] [address] Phone: [phone] Subjective:    I'm seeing this patient by the request  of:    CC: right arm pain    [id number]  [name] is a 46 y.o. female coming in with complaint of right arm pain Patient is had this pain for quite some time. In 2017 patient did unfortunately have a have cervical surgery.  Patient did see me previously and did have some acromioclavicular arthritis and we attempted an injection. Mild improvement. We started gabapentin was also very mild improvement. Overall states that she is about 10% better. Still having more right shoulder pain. Still radiation down the arm with some pain can wake her up at night. Patient's chronic pain medications is not seem to be helping overall.    Past Medical History:  Diagnosis Date  . Anemia   . Anxiety   . Arthritis    inflammitory  . Asthma   . B12 deficiency   . Back pain   . Chronic joint pain   . Constipation   . DDD (degenerative disc disease), lumbar    low back and neck and shoulders  . Depression    during divorce & legal matters  . Fatty liver   . Fibromyalgia   . Gallbladder problem   . GERD (gastroesophageal reflux disease)   . History of kidney stones    Dr Phebe Colla  . History of polycystic ovarian disease S/P BSO  . Hx of anxiety disorder   . Hypertension    a  . Hypothyroidism    no meds  . IBS (irritable bowel syndrome)   . IBS (irritable bowel syndrome)   . Infertility, female   . Joint pain   . Kidney problem   . Lactose intolerance   . Lupus hx positive ANA   followed by Dr Gavin Pound; possible lupus  . Multiple food allergies   . Myalgia   . Osteoarthritis   . 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 POLYCYSTIC OVARIAN DISEASE 03/02/2007   Qualifier: Diagnosis of  By: Linna Darner MD, Rae Mar BSO for cysts & endometriosis; Dr Reino Kent, Gyn Seeing Dr Paula Compton    . PONV (postoperative  nausea and vomiting)   . Prediabetes   . Sweating profusely   . Symptomatic mammary hypertrophy   . URI (upper respiratory infection)    currently on cefdinir   Past Surgical History:  Procedure Laterality Date  . [place]   exploratory lap  . BREAST REDUCTION SURGERY Bilateral 03/26/2015   Procedure: BILATERAL BREAST REDUCTION ;  Surgeon: Wallace Going, DO;  Location: [facility];  Service: Plastics;  Laterality: Bilateral;  . CARPAL TUNNEL RELEASE     bilateral; Dr Sherwood Gambler  . COLONOSCOPY     in 1990s  . CYSTO/ RIGHT RETROGRADE URETERAL PYELOGRAM  07-09-2004   HX BILATERAL RENAL STONES/ RIGHT FLANK PAIN  . CYSTOSCOPY W/ URETERAL STENT PLACEMENT  11/12/2011   Procedure: CYSTOSCOPY WITH RETROGRADE PYELOGRAM/URETERAL STENT PLACEMENT;  Surgeon: Alexis Frock, MD;  Location: [facility];  Service: Urology;  Laterality: Right;  . CYSTOSCOPY W/ URETERAL STENT REMOVAL  12/07/2011   Procedure: CYSTOSCOPY WITH STENT REMOVAL;  Surgeon: Alexis Frock, MD;  Location: [facility];  Service: Urology;  Laterality: Right;  . CYSTOSCOPY/RETROGRADE/URETEROSCOPY/STONE EXTRACTION WITH BASKET  12/07/2011   Procedure: CYSTOSCOPY/RETROGRADE/URETEROSCOPY/STONE EXTRACTION WITH BASKET;  Surgeon:  Alexis Frock, MD;  Location: [facility];  Service: Urology;  Laterality: Right;  . DILATION AND CURETTAGE OF UTERUS     X5  . EXCISION LEFT BARTHOLIN GLAND  02-05-2002  . EYE SURGERY     Retinal tears surgery bilateral  . LAPAROSCOPIC ASSISTED VAGINAL HYSTERECTOMY  2000  . LAPAROSCOPIC CHOLECYSTECTOMY  05-31-2007  . LAPAROSCOPIC LASER ABLATION ENDOMETRIOSIS AND LEFT SALPINGO-OOPHORECTOMY  11-03-1999  . LAPAROSCOPY WITH RIGHT SALPINGO-OOPHECTOMY/ LYSIS ADHESIONS AND ABLATION ENDOMETRIOSIS  05-17-2001  . LIPOSUCTION Bilateral 03/26/2015   Procedure: WITH LIPOSUCTION;  Surgeon: Wallace Going, DO;  Location: [facility];  Service: Plastics;  Laterality: Bilateral;  . LUMBAR LAMINECTOMY  2003   L4 - L5  . PLANTAR FASCIA SURGERY     right  . PLANTAR FASCIA SURGERY  02/2011   left  . RIGHT URETEROSCOPIC STONE EXTRACTION  08-04-2000  . TONSILLECTOMY    . URETERAL STENT PLACEMENT  11/12/11   Dr Tresa Moore   Social History   Social History  . Marital status: Married    Spouse name: Darnelle Maffucci  . Number of children: 0  . Years of education: college   Occupational History  . disabled    Social History Main Topics  . Smoking status: Never Smoker  . Smokeless tobacco: Never Used  . Alcohol use 0.6 oz/week    1 Shots of liquor per week     Comment:  very rarely  . Drug use: No  . Sexual activity: Yes    Birth control/ protection: Surgical   Other Topics Concern  . None   Social History Narrative   Patient Lives at home with her husband Darnelle Maffucci)   Disabled.   Education two years of college.   Right handed.   Caffeine coffee and sweet tea. Not daily.   Allergies  Allergen Reactions  . Chloraprep One Step [Chlorhexidine Gluconate] Rash    Developed severe rash where chloraprep was used on chest area  . Ivp Dye [Iodinated Diagnostic Agents] Rash and Other (See Comments)    Flushing, minor facial rash & dyspnea  . Nalbuphine Shortness Of Breath and Rash    Nubain caused respiratory distress & rash  . Septra [Sulfamethoxazole-Trimethoprim] Shortness Of Breath and Rash  . Sulfamethoxazole-Trimethoprim Rash    rash  . Tylox [Oxycodone-Acetaminophen] Rash   Family History  Problem Relation Age of Onset  . Hypertension Mother   . Colon polyps Mother   . Atrial fibrillation Mother        Dr Angelena Form  . Hyperlipidemia Mother   . Heart disease Mother   . Anxiety disorder Mother   . Sleep apnea Mother   . Obesity Mother   . Heart attack Father 81  . Diabetes Father   . Hypertension Father   . Hyperlipidemia Father   . Heart disease Father   . Stroke Father   . Obesity Father   .  Hypertension Sister   . Heart attack Paternal Grandmother 54  . Breast cancer Paternal Aunt   . Colon cancer Maternal Uncle   . Colon cancer Paternal Uncle   . Colon polyps Maternal Grandmother   . Stroke Maternal Grandmother 76  . Diabetes Paternal Grandfather   . Stroke Maternal Grandfather 37    Past medical history, social, surgical and family history all reviewed in electronic medical record.  No pertanent information unless stated regarding to the chief complaint.   Review of Systems: No  visual changes, nausea, vomiting, diarrhea, constipation, dizziness,  abdominal pain, skin rash, fevers, chills, night sweats, weight loss, swollen lymph nodes,  chest pain, shortness of breath, mood changes.  Positive muscle aches, body aches, headaches  Objective  Blood pressure 128/80, pulse 72, height 5\' 8"  (1.727 m), weight 243 lb (110.2 kg).   Systems examined below as of 10/11/16 General: NAD A&O x3 mood, affect normal  HEENT: Pupils equal, extraocular movements intact no nystagmus Respiratory: not short of breath at rest or with speaking Cardiovascular: No lower extremity edema, non tender Skin: Warm dry intact with no signs of infection or rash on extremities or on axial skeleton. Abdomen: Soft nontender, no masses Neuro: Cranial nerves  intact, neurovascularly intact in all extremities with 2+ DTRs and 2+ pulses. Lymph: No lymphadenopathy appreciated today  Gait normal with good balance and coordination.  MSK: Non tender with full range of motion and good stability and symmetric strength and tone of  elbows, wrist,  knee hips and ankles bilaterally.  Mild arthritic changes of multiple joints Neck: Inspection reveals mild loss of lordosis No palpable stepoffs. Mild positive Spurling's maneuver. Mild decrease in range of motion of the neck. Grip strength and sensation normal in bilateral hands Strength good C4 to T1 distribution No sensory change to C4 to T1 Negative Hoffman sign  bilaterally Reflexes normal  Shoulder: Right Inspection reveals no abnormalities, atrophy or asymmetry. Palpation is normal with no tenderness over AC joint or bicipital groove. ROM is full in all planes passively. Rotator cuff strength normal throughout. signs of impingement with positive Neer and Hawkin's tests, but negative empty can sign. Speeds and Yergason's tests normal. No labral pathology noted with negative Obrien's, negative clunk and good stability. Normal scapular function observed. No painful arc and no drop arm sign. No apprehension sign Contralateral shoulder unremarkable   Procedure: Real-time Ultrasound Guided Injection of right glenohumeral joint Device: GE Logiq E  Ultrasound guided injection is preferred based studies that show increased duration, increased effect, greater accuracy, decreased procedural pain, increased response rate with ultrasound guided versus blind injection.  Verbal informed consent obtained.  Time-out conducted.  Noted no overlying erythema, induration, or other signs of local infection.  Skin prepped in a sterile fashion.  Local anesthesia: Topical Ethyl chloride.  With sterile technique and under real time ultrasound guidance:  Joint visualized.  23g 1  inch needle inserted posterior approach. Pictures taken for needle placement. Patient did have injection of 2 cc of 1% lidocaine, 2 cc of 0.5% Marcaine, and 1.0 cc of Kenalog 40 mg/dL. Completed without difficulty  Pain immediately resolved suggesting accurate placement of the medication.  Advised to call if fevers/chills, erythema, induration, drainage, or persistent bleeding.  Images permanently stored and available for review in the ultrasound unit.  Impression: Technically successful ultrasound guided injection.     Impression and Recommendations:     This case required medical decision making of moderate complexity.      Note: This dictation was prepared with Dragon  dictation along with smaller phrase technology. Any transcriptional errors that result from this process are unintentional.

## 2016-10-12 ENCOUNTER — Telehealth: Payer: Self-pay | Admitting: Internal Medicine

## 2016-10-12 ENCOUNTER — Ambulatory Visit (INDEPENDENT_AMBULATORY_CARE_PROVIDER_SITE_OTHER): Payer: PPO | Admitting: Family Medicine

## 2016-10-12 ENCOUNTER — Telehealth: Payer: Self-pay | Admitting: Emergency Medicine

## 2016-10-12 VITALS — BP 123/80 | HR 82 | Temp 99.1°F | Ht 68.0 in | Wt 237.0 lb

## 2016-10-12 DIAGNOSIS — K5909 Other constipation: Secondary | ICD-10-CM

## 2016-10-12 DIAGNOSIS — R7989 Other specified abnormal findings of blood chemistry: Secondary | ICD-10-CM | POA: Diagnosis not present

## 2016-10-12 DIAGNOSIS — IMO0001 Reserved for inherently not codable concepts without codable children: Secondary | ICD-10-CM

## 2016-10-12 DIAGNOSIS — R945 Abnormal results of liver function studies: Secondary | ICD-10-CM

## 2016-10-12 DIAGNOSIS — F410 Panic disorder [episodic paroxysmal anxiety] without agoraphobia: Secondary | ICD-10-CM

## 2016-10-12 DIAGNOSIS — E669 Obesity, unspecified: Secondary | ICD-10-CM | POA: Diagnosis not present

## 2016-10-12 DIAGNOSIS — E559 Vitamin D deficiency, unspecified: Secondary | ICD-10-CM

## 2016-10-12 DIAGNOSIS — Z9189 Other specified personal risk factors, not elsewhere classified: Secondary | ICD-10-CM | POA: Diagnosis not present

## 2016-10-12 DIAGNOSIS — Z6836 Body mass index (BMI) 36.0-36.9, adult: Secondary | ICD-10-CM | POA: Diagnosis not present

## 2016-10-12 DIAGNOSIS — E860 Dehydration: Secondary | ICD-10-CM

## 2016-10-12 DIAGNOSIS — R7303 Prediabetes: Secondary | ICD-10-CM

## 2016-10-12 MED ORDER — VITAMIN D (ERGOCALCIFEROL) 1.25 MG (50000 UNIT) PO CAPS: 50000.0000 [IU] | ORAL_CAPSULE | ORAL | 0 refills | Status: DC

## 2016-10-12 MED ORDER — METFORMIN HCL 500 MG PO TABS: 500.0000 mg | ORAL_TABLET | Freq: Every day | ORAL | 0 refills | Status: DC

## 2016-10-12 NOTE — Progress Notes (Signed)
Office: [phone]  /  Fax: [phone]   HPI:   Chief Complaint: OBESITY [name] is here to discuss her progress with her obesity treatment plan. She is on the  follow the Category 3 plan and is following her eating plan approximately 90 % of the time. She states she is walking 3 to 4 times per week. [name] has done well with weight loss but noted increased hunger, especially in the morning. She did a lot of walking while [place] the beach. She didn't like all the protein at dinner and would like to increase breakfast protein. Her weight is 237 lb (107.5 kg) today and has had a weight loss of 4 pounds over a period of 2 weeks since her last visit. She has lost 4 lbs since starting treatment with [place].  Vitamin D deficiency [name] has a diagnosis of vitamin D deficiency. She is currently taking OTC vit D. She admits fatigue and denies nausea, vomiting or muscle weakness.  Pre-Diabetes [name] has a diagnosis of pre-diabetes based on her elevated fasting insulin level >5 and elevated Hgb A1c and was informed this puts her at greater risk of developing diabetes. She is not taking metformin currently and continues to work on diet and exercise to decrease risk of diabetes. She admits polyphagia and denies nausea or hypoglycemia.  At risk for diabetes [name] is at higher than average risk for developing diabetes due to her obesity and pre-diabetes. She currently denies polyuria or polydipsia.  Elevated LFT [name] has a new dx of elevated ALT with normal AST, likely due to fatty liver.  She denies abdominal pain or jaundice and has never been told of any liver problems in the past. She denies excessive alcohol intake.  Constipation [name] notes constipation for the last few weeks, worse since attempting weight loss. She has a history of IBS and notes increased abdominal cramping. She felt eating a fiber one 90 calorie bar at night helped. She states BM are less frequent and are not hard  and painful. She denies hematochezia or melena. She admits to drinking less H20 recently.  Dehydration [name] has a diagnosis of dehydration based on her elevated BUN at 30. She admits not drinking much H2O and increased heat. She denies lightheadedness and admits dry mouth.  ALLERGIES: Allergies  Allergen Reactions   Chloraprep One Step [Chlorhexidine Gluconate] Rash    Developed severe rash where chloraprep was used on chest area   Ivp Dye [Iodinated Diagnostic Agents] Rash and Other (See Comments)    Flushing, minor facial rash & dyspnea   Nalbuphine Shortness Of Breath and Rash    Nubain caused respiratory distress & rash   Septra [Sulfamethoxazole-Trimethoprim] Shortness Of Breath and Rash   Sulfamethoxazole-Trimethoprim Rash    rash   Tylox [Oxycodone-Acetaminophen] Rash    MEDICATIONS: Current Outpatient Prescriptions on File Prior to Visit  Medication Sig Dispense Refill   aspirin 81 MG tablet Take 81 mg by mouth daily.      celecoxib (CELEBREX) 200 MG capsule Take 1 capsule (200 mg total) by mouth 2 (two) times daily. 180 capsule 0   cetirizine (ZYRTEC) 10 MG chewable tablet Chew 10 mg by mouth daily.     cholecalciferol (VITAMIN D) 1000 UNITS tablet Take 1,000 Units by mouth daily.     DOXEPIN HCL PO Take by mouth at bedtime.     DULoxetine (CYMBALTA) 60 MG capsule Take 60 mg by mouth 2 (two) times daily.     gabapentin (NEURONTIN) 100 MG capsule  Take 2 capsules (200 mg total) by mouth at bedtime. 60 capsule 3   hydrochlorothiazide (MICROZIDE) 12.5 MG capsule Take 1 capsule (12.5 mg total) by mouth daily. 90 capsule 3   HYDROcodone-acetaminophen (NORCO/VICODIN) 5-325 MG tablet Take 1 tablet by mouth every 6 (six) hours as needed for moderate pain.     hydroxychloroquine (PLAQUENIL) 200 MG tablet Take 400 mg by mouth daily.     lisinopril (PRINIVIL,ZESTRIL) 40 MG tablet TAKE 1 TABLET EVERY DAY 90 tablet 3   No current facility-administered medications on  file prior to visit.     PAST MEDICAL HISTORY: Past Medical History:  Diagnosis Date   Anemia    Anxiety    Arthritis    inflammitory   Asthma    B12 deficiency    Back pain    Chronic joint pain    Constipation    DDD (degenerative disc disease), lumbar    low back and neck and shoulders   Depression    during divorce & legal matters   Fatty liver    Fibromyalgia    Gallbladder problem    GERD (gastroesophageal reflux disease)    History of kidney stones    Dr Phebe Colla   History of polycystic ovarian disease S/P BSO   Hx of anxiety disorder    Hypertension    a   Hypothyroidism    no meds   IBS (irritable bowel syndrome)    IBS (irritable bowel syndrome)    Infertility, female    Joint pain    Kidney problem    Lactose intolerance    Lupus hx positive ANA   followed by Dr Gavin Pound; possible lupus   Multiple food allergies    Myalgia    Osteoarthritis    Polyarthritis, 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[place])    POLYCYSTIC OVARIAN DISEASE 03/02/2007   Qualifier: Diagnosis of  By: Linna Darner MD, Rae Mar BSO for cysts & endometriosis; Dr Reino Kent, Gyn Seeing Dr Paula Compton     PONV (postoperative nausea and vomiting)    Prediabetes    Sweating profusely    Symptomatic mammary hypertrophy    URI (upper respiratory infection)    currently on cefdinir    PAST SURGICAL HISTORY: Past Surgical History:  Procedure Laterality Date   ANKLE SURGERY     X3   APPENDECTOMY  1991   exploratory lap   BREAST REDUCTION SURGERY Bilateral 03/26/2015   Procedure: BILATERAL BREAST REDUCTION ;  Surgeon: Wallace Going, DO;  Location: [facility];  Service: Plastics;  Laterality: Bilateral;   CARPAL TUNNEL RELEASE     bilateral; Dr Sherwood Gambler   COLONOSCOPY     in [place]  07-09-2004   HX BILATERAL RENAL STONES/ RIGHT FLANK PAIN   CYSTOSCOPY W/ URETERAL STENT PLACEMENT  11/12/2011    Procedure: CYSTOSCOPY WITH RETROGRADE PYELOGRAM/URETERAL STENT PLACEMENT;  Surgeon: Alexis Frock, MD;  Location: [facility];  Service: Urology;  Laterality: Right;   CYSTOSCOPY W/ URETERAL STENT REMOVAL  12/07/2011   Procedure: CYSTOSCOPY WITH STENT REMOVAL;  Surgeon: Alexis Frock, MD;  Location: [facility];  Service: Urology;  Laterality: Right;   CYSTOSCOPY/RETROGRADE/URETEROSCOPY/STONE EXTRACTION WITH BASKET  12/07/2011   Procedure: CYSTOSCOPY/RETROGRADE/URETEROSCOPY/STONE EXTRACTION WITH BASKET;  Surgeon: Alexis Frock, MD;  Location: [facility];  Service: Urology;  Laterality: Right;   DILATION AND CURETTAGE OF UTERUS     X5   EXCISION LEFT BARTHOLIN GLAND  02-05-2002  EYE SURGERY     Retinal tears surgery bilateral   LAPAROSCOPIC ASSISTED VAGINAL HYSTERECTOMY  2000   LAPAROSCOPIC CHOLECYSTECTOMY  05-31-2007   LAPAROSCOPIC LASER ABLATION ENDOMETRIOSIS AND LEFT SALPINGO-OOPHORECTOMY  11-03-1999   LAPAROSCOPY WITH RIGHT SALPINGO-OOPHECTOMY/ LYSIS ADHESIONS AND ABLATION ENDOMETRIOSIS  05-17-2001   LIPOSUCTION Bilateral 03/26/2015   Procedure: WITH LIPOSUCTION;  Surgeon: Wallace Going, DO;  Location: [place];  Service: Plastics;  Laterality: Bilateral;   LUMBAR LAMINECTOMY  2003   L4 - L5   PLANTAR FASCIA SURGERY     right   PLANTAR FASCIA SURGERY  02/2011   left   RIGHT URETEROSCOPIC STONE EXTRACTION  08-04-2000   TONSILLECTOMY     URETERAL STENT PLACEMENT  11/12/11   Dr Tresa Moore    SOCIAL HISTORY: Social History  Substance Use Topics   Smoking status: Never Smoker   Smokeless tobacco: Never Used   Alcohol use 0.6 oz/week    1 Shots of liquor per week     Comment:  very rarely    FAMILY HISTORY: Family History  Problem Relation Age of Onset   Hypertension Mother    Colon polyps Mother    Atrial fibrillation Mother        Dr Angelena Form   Hyperlipidemia Mother    Heart disease Mother    Anxiety  disorder Mother    Sleep apnea Mother    Obesity Mother    Heart attack Father 47   Diabetes Father    Hypertension Father    Hyperlipidemia Father    Heart disease Father    Stroke Father    Obesity Father    Hypertension Sister    Heart attack Paternal Grandmother 16   Breast cancer Paternal Aunt    Colon cancer Maternal Uncle    Colon cancer Paternal Uncle    Colon polyps Maternal Grandmother    Stroke Maternal Grandmother 76   Diabetes Paternal Grandfather    Stroke Maternal Grandfather 37    ROS: Review of Systems  Constitutional: Positive for malaise/fatigue and weight loss.       Negative jaundice  HENT:       Dry mouth  Gastrointestinal: Positive for constipation. Negative for abdominal pain, melena, nausea and vomiting.       Abdominal cramping  Genitourinary: Negative for frequency.  Musculoskeletal:       Negative muscle weakness  Neurological:       Negative lightheadedness  Endo/Heme/Allergies: Negative for polydipsia.       Polyphagia Negative hypoglycemia    PHYSICAL EXAM: Blood pressure 123/80, pulse 82, temperature 99.1 F (37.3 C), temperature source Oral, height 5\' 8"  (1.727 m), weight 237 lb (107.5 kg), SpO2 99 %. Body mass index is 36.04 kg/m. Physical Exam  Constitutional: She is oriented to person, place, and time. [name].  Cardiovascular: Normal rate.   Pulmonary/Chest: Effort normal.  Musculoskeletal: Normal range of motion.  Neurological: She is oriented to person, place, and time.  Skin: Skin is warm and dry.  Psychiatric: She has a normal mood and affect. Her behavior is normal.  Vitals reviewed.   RECENT LABS AND TESTS: BMET    Component Value Date/Time   NA 141 09/28/2016 1043   K 4.4 09/28/2016 1043   CL 104 09/28/2016 1043   CO2 26 09/28/2016 1043   GLUCOSE 83 09/28/2016 1043   GLUCOSE 92 08/16/2016 1011   BUN 19 09/28/2016 1043   CREATININE 0.64 09/28/2016 1043  CALCIUM 9.0 09/28/2016 1043   GFRNONAA 107 09/28/2016 1043   GFRAA 124 09/28/2016 1043   Lab Results  Component Value Date   HGBA1C 5.6 09/28/2016   HGBA1C 5.9 08/16/2016   HGBA1C 5.9 06/04/2015   HGBA1C 5.6 09/22/2014   HGBA1C 5.8 04/29/2013   Lab Results  Component Value Date   INSULIN 14.7 09/28/2016   CBC    Component Value Date/Time   WBC 6.6 08/16/2016 1011   RBC 4.68 08/16/2016 1011   HGB 13.2 08/16/2016 1011   HCT 39.4 08/16/2016 1011   PLT 235.0 08/16/2016 1011   MCV 84.2 08/16/2016 1011   MCH 27.0 04/19/2015 0650   MCHC 33.4 08/16/2016 1011   RDW 13.9 08/16/2016 1011   LYMPHSABS 1.8 08/16/2016 1011   MONOABS 0.6 08/16/2016 1011   EOSABS 0.2 08/16/2016 1011   BASOSABS 0.0 08/16/2016 1011   Iron/TIBC/Ferritin/ %Sat No results found for: IRON, TIBC, FERRITIN, IRONPCTSAT Lipid Panel     Component Value Date/Time   CHOL 153 08/16/2016 1011   TRIG 61.0 08/16/2016 1011   HDL 55.40 08/16/2016 1011   CHOLHDL 3 08/16/2016 1011   VLDL 12.2 08/16/2016 1011   LDLCALC 85 08/16/2016 1011   Hepatic Function Panel     Component Value Date/Time   PROT 6.8 09/28/2016 1043   ALBUMIN 4.1 09/28/2016 1043   AST 30 09/28/2016 1043   ALT 39 (H) 09/28/2016 1043   ALKPHOS 115 09/28/2016 1043   BILITOT 0.4 09/28/2016 1043   BILIDIR 0.1 06/12/2013 1632   IBILI 0.2 06/11/2009 2309      Component Value Date/Time   TSH 1.54 08/16/2016 1011   TSH 2.40 06/04/2015 0857   TSH 2.45 02/16/2015 1706    ASSESSMENT AND PLAN: Prediabetes - Plan: metFORMIN (GLUCOPHAGE) 500 MG tablet  Vitamin D deficiency - Plan: Vitamin D, Ergocalciferol, (DRISDOL) 50000 units CAPS capsule  Elevated liver function tests  Other constipation  Dehydration  At risk for diabetes mellitus  Class 2 obesity with serious comorbidity and body mass index (BMI) of 36.0 to 36.9 in adult, unspecified obesity type  PLAN:  Vitamin D Deficiency [name] was informed that low vitamin D levels  contributes to fatigue and are associated with obesity, breast, and colon cancer. She agrees to start to take prescription Vit D @50 ,000 IU every week #4 with no refills and will follow up for routine testing of vitamin D, at least 2-3 times per year. She was informed of the risk of over-replacement of vitamin D and agrees to not increase her dose unless he discusses this with [place] first. [name] agrees to follow up with our clinic in 2 weeks.  Pre-Diabetes [name] will continue to work on weight loss, exercise, and decreasing simple carbohydrates in her diet to help decrease the risk of diabetes. We dicussed metformin including benefits and risks. She was informed that eating too many simple carbohydrates or too many calories at one sitting increases the likelihood of GI side effects. [name] requested metformin for now and a prescription was written today for metformin 500 mg every morning #30 with no refills. [name] agreed to follow up with [place] as directed to monitor her progress.  Diabetes risk counselling [name] was given extended (at least 30 minutes) diabetes prevention counseling today. She is 46 y.o. female and has risk factors for diabetes including obesity and pre-diabetes. We discussed intensive lifestyle modifications today with an emphasis on weight loss as well as increasing exercise and decreasing simple carbohydrates in her diet.  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 [name] was educated on her risk of developing NASH or even liver failure and th only proven treatment for NAFLD was weight loss. [name] agreed to continue with her weight loss efforts with healthier diet and exercise as an essential part of her treatment plan. We will re-check labs in 3 months and she agrees to follow up with our clinic in 2 weeks.  Constipation [name] was informed decrease bowel movement frequency is  normal while losing weight, but stools should not be hard or painful. She was advised to increase her H20 intake and work on increasing her fiber intake. High fiber foods were discussed today. [name] was advised, okay to continue fiber one bar and she agrees to follow up with our clinic in 2 weeks.  Dehydration [name] was advised to increase H2O intake and we will re-check labs in 3 months. [name] agrees to follow up with our clinic in 2 weeks.  Obesity [name] is currently in the action stage of change. As such, her goal is to continue with weight loss efforts She has agreed to follow the Category 3 plan [name] has been instructed to work up to a goal of 150 minutes of combined cardio and strengthening exercise per week for weight loss and overall health benefits. We discussed the following Behavioral Modification Strataeies today: meal planning & cooking strategies, increasing lean protein intake and emotional eating strategies  Glenice has agreed to follow up with our clinic in 2 weeks. She was informed of the importance of frequent follow up visits to maximize her success with intensive lifestyle modifications for her multiple health conditions.  I, Doreene Nest, am acting as transcriptionist for Dennard Nip, MD  I have reviewed the above documentation for accuracy and completeness, and I agree with the above. -Dennard Nip, MD  OBESITY BEHAVIORAL INTERVENTION VISIT  Today's visit was # 2 out of 56.  Starting weight: 241 lbs Starting date: 09/28/16 Today's weight : 237 lbs Today's date: 10/12/2016 Total lbs lost to date: 4 (Patients must lose 7 lbs in the first 6 months to continue with counseling)   ASK: We discussed the diagnosis of obesity with [place] today and Jendaya agreed to give [place] permission to discuss obesity behavioral modification therapy today.  ASSESS: Rilyn has the diagnosis of obesity and her BMI today is 36.1 [place] is in the  action stage of change   ADVISE: Adonna was educated on the multiple health risks of obesity as well as the benefit of weight loss to improve her health. She was advised of the need for long term treatment and the importance of lifestyle modifications.  AGREE: Multiple dietary modification options and treatment options were discussed and  [place] agreed to follow the Category 3 plan We discussed the following Behavioral Modification Strategies today: meal planning & cooking strategies, increasing lean protein intake and emotional eating strategies

## 2016-10-12 NOTE — Telephone Encounter (Signed)
Refuse - no longer taking

## 2016-10-12 NOTE — Telephone Encounter (Signed)
Received a refill request for Citalopram from [facility], it is not on pts current med list. Please advise.

## 2016-10-12 NOTE — Telephone Encounter (Signed)
Done

## 2016-10-12 NOTE — Telephone Encounter (Signed)
[facility] off of the pts chart she does not use that pharmacy

## 2016-10-13 ENCOUNTER — Other Ambulatory Visit: Payer: Self-pay | Admitting: Emergency Medicine

## 2016-10-13 NOTE — Telephone Encounter (Signed)
RX refused.

## 2016-10-26 ENCOUNTER — Ambulatory Visit (INDEPENDENT_AMBULATORY_CARE_PROVIDER_SITE_OTHER): Payer: PPO | Admitting: Family Medicine

## 2016-10-26 VITALS — BP 109/64 | HR 73 | Temp 99.0°F | Ht 68.0 in | Wt 233.0 lb

## 2016-10-26 DIAGNOSIS — F3289 Other specified depressive episodes: Secondary | ICD-10-CM | POA: Diagnosis not present

## 2016-10-26 DIAGNOSIS — IMO0001 Reserved for inherently not codable concepts without codable children: Secondary | ICD-10-CM

## 2016-10-26 DIAGNOSIS — Z6835 Body mass index (BMI) 35.0-35.9, adult: Secondary | ICD-10-CM

## 2016-10-26 DIAGNOSIS — R7303 Prediabetes: Secondary | ICD-10-CM

## 2016-10-26 DIAGNOSIS — E669 Obesity, unspecified: Secondary | ICD-10-CM | POA: Diagnosis not present

## 2016-10-26 DIAGNOSIS — Z9189 Other specified personal risk factors, not elsewhere classified: Secondary | ICD-10-CM

## 2016-10-26 MED ORDER — BUPROPION HCL ER (SR) 150 MG PO TB12: 150.0000 mg | ORAL_TABLET | Freq: Every day | ORAL | 0 refills | Status: DC

## 2016-10-26 MED ORDER — METFORMIN HCL 500 MG PO TABS: 500.0000 mg | ORAL_TABLET | Freq: Two times a day (BID) | ORAL | 0 refills | Status: DC

## 2016-10-26 NOTE — Progress Notes (Signed)
Office: [phone]  /  Fax: [phone]   HPI:   Chief Complaint: OBESITY [name] is here to discuss her progress with her obesity treatment plan. She is on the  follow the Category 3 plan and is following her eating plan approximately 95 % of the time. She states she is walking for 30 minutes 7 times per week. [name] continues to do well with weight loss on category 3 plan. She is deviating more, due to increased eating out. Her weight is 233 lb (105.7 kg) today and has had a weight loss of 4 pounds over a period of 2 weeks since her last visit. She has lost 8 lbs since starting treatment with [place].  Depression with emotional eating behaviors [name] is on celexa and cymbalta (for neuropathy) but is still struggling with emotional eating. [name] struggles with emotional eating and using food for comfort to the extent that it is negatively impacting her health. She often snacks when she is not hungry. [name] sometimes feels she is out of control and then feels guilty that she made poor food choices. She has been working on behavior modification techniques to help reduce her emotional eating and has been somewhat successful. She shows no sign of suicidal or homicidal ideations.  Pre-Diabetes [name] has a diagnosis of pre-diabetes based on her elevated Hgb A1c and was informed this puts her at greater risk of developing diabetes. She is taking metformin currently and continues to work on diet and exercise to decrease risk of diabetes. She notes decreased polyphagia in the morning but still struggles in the evening. She denies nausea or hypoglycemia.  At risk for diabetes [name] is at higher than average risk for developing diabetes due to her obesity and pre-diabetes. She currently denies polyuria or polydipsia.   Depression screen [facility] 2/9 09/28/2016 07/29/2015 09/22/2014  Decreased Interest 2 0 0  Down, Depressed, Hopeless 1 0 0  PHQ - 2 Score 3 0 0  Altered sleeping 1 - -    Tired, decreased energy 2 - -  Change in appetite 1 - -  Feeling bad or failure about yourself  1 - -  Trouble concentrating 2 - -  Moving slowly or fidgety/restless 1 - -  Suicidal thoughts 0 - -  PHQ-9 Score 11 - -  Some recent data might be hidden     ALLERGIES: Allergies  Allergen Reactions  . Chloraprep One Step [Chlorhexidine Gluconate] Rash    Developed severe rash where chloraprep was used on chest area  . Ivp Dye [Iodinated Diagnostic Agents] Rash and Other (See Comments)    Flushing, minor facial rash & dyspnea  . Nalbuphine Shortness Of Breath and Rash    Nubain caused respiratory distress & rash  . Septra [Sulfamethoxazole-Trimethoprim] Shortness Of Breath and Rash  . Sulfamethoxazole-Trimethoprim Rash    rash  . Tylox [Oxycodone-Acetaminophen] Rash    MEDICATIONS: Current Outpatient Prescriptions on File Prior to Visit  Medication Sig Dispense Refill  . aspirin 81 MG tablet Take 81 mg by mouth daily.     . cetirizine (ZYRTEC) 10 MG chewable tablet Chew 10 mg by mouth daily.    . cholecalciferol (VITAMIN D) 1000 UNITS tablet Take 1,000 Units by mouth daily.    [name] DOXEPIN HCL PO Take by mouth at bedtime.    . DULoxetine (CYMBALTA) 60 MG capsule Take 60 mg by mouth 2 (two) times daily.    [name] gabapentin (NEURONTIN) 100 MG capsule Take 2 capsules (200 mg total) by mouth at  bedtime. 60 capsule 3  . hydrochlorothiazide (MICROZIDE) 12.5 MG capsule Take 1 capsule (12.5 mg total) by mouth daily. 90 capsule 3  . HYDROcodone-acetaminophen (NORCO/VICODIN) 5-325 MG tablet Take 1 tablet by mouth every 6 (six) hours as needed for moderate pain.    . hydroxychloroquine (PLAQUENIL) 200 MG tablet Take 400 mg by mouth daily.    [name] lisinopril (PRINIVIL,ZESTRIL) 40 MG tablet TAKE 1 TABLET EVERY DAY 90 tablet 3  . metFORMIN (GLUCOPHAGE) 500 MG tablet Take 1 tablet (500 mg total) by mouth daily with breakfast. 30 tablet 0  . Vitamin D, Ergocalciferol, (DRISDOL) 50000 units CAPS capsule  Take 1 capsule (50,000 Units total) by mouth every 7 (seven) days. 4 capsule 0   No current facility-administered medications on file prior to visit.     PAST MEDICAL HISTORY: Past Medical History:  Diagnosis Date  . Anemia   . Anxiety   . Arthritis    inflammitory  . Asthma   . B12 deficiency   . Back pain   . Chronic joint pain   . Constipation   . DDD (degenerative disc disease), lumbar    low back and neck and shoulders  . Depression    during divorce & legal matters  . Fatty liver   . Fibromyalgia   . Gallbladder problem   . GERD (gastroesophageal reflux disease)   . History of kidney stones    Dr Phebe Colla  . History of polycystic ovarian disease S/P BSO  . Hx of anxiety disorder   . Hypertension    a  . Hypothyroidism    no meds  . IBS (irritable bowel syndrome)   . IBS (irritable bowel syndrome)   . Infertility, female   . Joint pain   . Kidney problem   . Lactose intolerance   . Lupus hx positive ANA   followed by Dr Gavin Pound; possible lupus  . Multiple food allergies   . Myalgia   . Osteoarthritis   . 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 POLYCYSTIC OVARIAN DISEASE 03/02/2007   Qualifier: Diagnosis of  By: Linna Darner MD, Rae Mar BSO for cysts & endometriosis; Dr Reino Kent, Gyn Seeing Dr Paula Compton    . PONV (postoperative nausea and vomiting)   . Prediabetes   . Sweating profusely   . Symptomatic mammary hypertrophy   . URI (upper respiratory infection)    currently on cefdinir    PAST SURGICAL HISTORY: Past Surgical History:  Procedure Laterality Date  . [place]   exploratory lap  . BREAST REDUCTION SURGERY Bilateral 03/26/2015   Procedure: BILATERAL BREAST REDUCTION ;  Surgeon: Wallace Going, DO;  Location: [facility];  Service: Plastics;  Laterality: Bilateral;  . CARPAL TUNNEL RELEASE     bilateral; Dr Sherwood Gambler  . COLONOSCOPY     in 1990s  . CYSTO/ RIGHT RETROGRADE  URETERAL PYELOGRAM  07-09-2004   HX BILATERAL RENAL STONES/ RIGHT FLANK PAIN  . CYSTOSCOPY W/ URETERAL STENT PLACEMENT  11/12/2011   Procedure: CYSTOSCOPY WITH RETROGRADE PYELOGRAM/URETERAL STENT PLACEMENT;  Surgeon: Alexis Frock, MD;  Location: [facility];  Service: Urology;  Laterality: Right;  . CYSTOSCOPY W/ URETERAL STENT REMOVAL  12/07/2011   Procedure: CYSTOSCOPY WITH STENT REMOVAL;  Surgeon: Alexis Frock, MD;  Location: [facility];  Service: Urology;  Laterality: Right;  . CYSTOSCOPY/RETROGRADE/URETEROSCOPY/STONE EXTRACTION WITH BASKET  12/07/2011   Procedure: CYSTOSCOPY/RETROGRADE/URETEROSCOPY/STONE EXTRACTION WITH BASKET;  Surgeon:  Alexis Frock, MD;  Location: [facility];  Service: Urology;  Laterality: Right;  . DILATION AND CURETTAGE OF UTERUS     X5  . EXCISION LEFT BARTHOLIN GLAND  02-05-2002  . EYE SURGERY     Retinal tears surgery bilateral  . LAPAROSCOPIC ASSISTED VAGINAL HYSTERECTOMY  2000  . LAPAROSCOPIC CHOLECYSTECTOMY  05-31-2007  . LAPAROSCOPIC LASER ABLATION ENDOMETRIOSIS AND LEFT SALPINGO-OOPHORECTOMY  11-03-1999  . LAPAROSCOPY WITH RIGHT SALPINGO-OOPHECTOMY/ LYSIS ADHESIONS AND ABLATION ENDOMETRIOSIS  05-17-2001  . LIPOSUCTION Bilateral 03/26/2015   Procedure: WITH LIPOSUCTION;  Surgeon: Wallace Going, DO;  Location: [facility];  Service: Plastics;  Laterality: Bilateral;  . LUMBAR LAMINECTOMY  2003   L4 - L5  . PLANTAR FASCIA SURGERY     right  . PLANTAR FASCIA SURGERY  02/2011   left  . RIGHT URETEROSCOPIC STONE EXTRACTION  08-04-2000  . TONSILLECTOMY    . URETERAL STENT PLACEMENT  11/12/11   Dr Tresa Moore    SOCIAL HISTORY: Social History  Substance Use Topics  . Smoking status: Never Smoker  . Smokeless tobacco: Never Used  . Alcohol use 0.6 oz/week    1 Shots of liquor per week     Comment:  very rarely    FAMILY HISTORY: Family History  Problem Relation Age of Onset  . Hypertension Mother   . Colon  polyps Mother   . Atrial fibrillation Mother        Dr Angelena Form  . Hyperlipidemia Mother   . Heart disease Mother   . Anxiety disorder Mother   . Sleep apnea Mother   . Obesity Mother   . Heart attack Father 65  . Diabetes Father   . Hypertension Father   . Hyperlipidemia Father   . Heart disease Father   . Stroke Father   . Obesity Father   . Hypertension Sister   . Heart attack Paternal Grandmother 82  . Breast cancer Paternal Aunt   . Colon cancer Maternal Uncle   . Colon cancer Paternal Uncle   . Colon polyps Maternal Grandmother   . Stroke Maternal Grandmother 76  . Diabetes Paternal Grandfather   . Stroke Maternal Grandfather 37    ROS: Review of Systems  Constitutional: Positive for weight loss.  Gastrointestinal: Negative for nausea.  Genitourinary: Negative for frequency.  Endo/Heme/Allergies: Negative for polydipsia.       Polyphagia Negative hypoglycemia  Psychiatric/Behavioral: Positive for depression. Negative for suicidal ideas.    PHYSICAL EXAM: Blood pressure 109/64, pulse 73, temperature 99 F (37.2 C), temperature source Oral, height 5\' 8"  (1.727 m), weight 233 lb (105.7 kg), SpO2 98 %. Body mass index is 35.43 kg/m. Physical Exam  Constitutional: She is oriented to person, place, and time. [name].  Cardiovascular: Normal rate.   Pulmonary/Chest: Effort normal.  Musculoskeletal: Normal range of motion.  Neurological: She is oriented to person, place, and time.  Skin: Skin is warm and dry.  Vitals reviewed.   RECENT LABS AND TESTS: BMET    Component Value Date/Time   NA 141 09/28/2016 1043   K 4.4 09/28/2016 1043   CL 104 09/28/2016 1043   CO2 26 09/28/2016 1043   GLUCOSE 83 09/28/2016 1043   GLUCOSE 92 08/16/2016 1011   BUN 19 09/28/2016 1043   CREATININE 0.64 09/28/2016 1043   CALCIUM 9.0 09/28/2016 1043   GFRNONAA 107 09/28/2016 1043   GFRAA 124 09/28/2016 1043   Lab Results  Component  Value Date  HGBA1C 5.6 09/28/2016   HGBA1C 5.9 08/16/2016   HGBA1C 5.9 06/04/2015   HGBA1C 5.6 09/22/2014   HGBA1C 5.8 04/29/2013   Lab Results  Component Value Date   INSULIN 14.7 09/28/2016   CBC    Component Value Date/Time   WBC 6.6 08/16/2016 1011   RBC 4.68 08/16/2016 1011   HGB 13.2 08/16/2016 1011   HCT 39.4 08/16/2016 1011   PLT 235.0 08/16/2016 1011   MCV 84.2 08/16/2016 1011   MCH 27.0 04/19/2015 0650   MCHC 33.4 08/16/2016 1011   RDW 13.9 08/16/2016 1011   LYMPHSABS 1.8 08/16/2016 1011   MONOABS 0.6 08/16/2016 1011   EOSABS 0.2 08/16/2016 1011   BASOSABS 0.0 08/16/2016 1011   Iron/TIBC/Ferritin/ %Sat No results found for: IRON, TIBC, FERRITIN, IRONPCTSAT Lipid Panel     Component Value Date/Time   CHOL 153 08/16/2016 1011   TRIG 61.0 08/16/2016 1011   HDL 55.40 08/16/2016 1011   CHOLHDL 3 08/16/2016 1011   VLDL 12.2 08/16/2016 1011   LDLCALC 85 08/16/2016 1011   Hepatic Function Panel     Component Value Date/Time   PROT 6.8 09/28/2016 1043   ALBUMIN 4.1 09/28/2016 1043   AST 30 09/28/2016 1043   ALT 39 (H) 09/28/2016 1043   ALKPHOS 115 09/28/2016 1043   BILITOT 0.4 09/28/2016 1043   BILIDIR 0.1 06/12/2013 1632   IBILI 0.2 06/11/2009 2309      Component Value Date/Time   TSH 1.54 08/16/2016 1011   TSH 2.40 06/04/2015 0857   TSH 2.45 02/16/2015 1706    ASSESSMENT AND PLAN: Prediabetes - Plan: metFORMIN (GLUCOPHAGE) 500 MG tablet  Other depression  At risk for diabetes mellitus  Class 2 obesity with serious comorbidity and body mass index (BMI) of 35.0 to 35.9 in adult, unspecified obesity type  PLAN:  Depression with Emotional Eating Behaviors We discussed behavior modification techniques today to help [name] deal with her emotional eating and depression. She has agreed to discontinue celexa and start to take Wellbutrin SR 150 mg every morning #30 with no refills and continue cymbalta as prescribed and will follow up as  directed.  Pre-Diabetes [name] will continue to work on weight loss, exercise, and decreasing simple carbohydrates in her diet to help decrease the risk of diabetes. We dicussed metformin including benefits and risks. She was informed that eating too many simple carbohydrates or too many calories at one sitting increases the likelihood of GI side effects. [name] agrees to increase metformin to 500 mg bid #60 with no refills  prescription. [name] agreed to follow up with [place] as directed to monitor her progress.  Diabetes risk counselling [name] was given extended (15 minutes) diabetes prevention counseling today. She is 46 y.o. female and has risk factors for diabetes including obesity and pre-diabetes. We discussed intensive lifestyle modifications today with an emphasis on weight loss as well as increasing exercise and decreasing simple carbohydrates in her diet.  Obesity [name] is currently in the action stage of change. As such, her goal is to continue with weight loss efforts She has agreed to follow the Category 3 plan [name] has been instructed to work up to a goal of 150 minutes of combined cardio and strengthening exercise per week for weight loss and overall health benefits. We discussed the following Behavioral Modification Strategies today: meal planning & cooking strategies, increasing lean protein intake, decreasing simple carbohydrates  and decrease eating out  [name] has agreed to follow up with our clinic in 2 weeks.  She was informed of the importance of frequent follow up visits to maximize her success with intensive lifestyle modifications for her multiple health conditions.  I, Doreene Nest, am acting as transcriptionist for Frances Nip, MD  I have reviewed the above documentation for accuracy and completeness, and I agree with the above. -Frances Nip, MD  OBESITY BEHAVIORAL INTERVENTION VISIT  Today's visit was # 3 out of 22.  Starting weight: 241  lbs Starting date: 09/28/16 Today's weight : 233 lbs Today's date: 10/26/2016 Total lbs lost to date: 8 (Patients must lose 7 lbs in the first 6 months to continue with counseling)   ASK: We discussed the diagnosis of obesity with [place] today and Katherleen agreed to give [place] permission to discuss obesity behavioral modification therapy today.  ASSESS: Blakeley has the diagnosis of obesity and her BMI today is 35.5 Colbi is in the action stage of change   ADVISE: Brehanna was educated on the multiple health risks of obesity as well as the benefit of weight loss to improve her health. She was advised of the need for long term treatment and the importance of lifestyle modifications.  AGREE: Multiple dietary modification options and treatment options were discussed and  Shawntay agreed to follow the Category 3 plan We discussed the following Behavioral Modification Strategies today: meal planning & cooking strategies, increasing lean protein intake, decreasing simple carbohydrates  and decrease eating out

## 2016-11-03 ENCOUNTER — Other Ambulatory Visit: Payer: Self-pay | Admitting: Emergency Medicine

## 2016-11-03 MED ORDER — HYDROCHLOROTHIAZIDE 12.5 MG PO CAPS: 12.5000 mg | ORAL_CAPSULE | Freq: Every day | ORAL | 2 refills | Status: DC

## 2016-11-03 MED ORDER — LISINOPRIL 40 MG PO TABS: ORAL_TABLET | ORAL | 2 refills | Status: DC

## 2016-11-09 ENCOUNTER — Telehealth (INDEPENDENT_AMBULATORY_CARE_PROVIDER_SITE_OTHER): Payer: Self-pay | Admitting: Family Medicine

## 2016-11-09 ENCOUNTER — Ambulatory Visit (INDEPENDENT_AMBULATORY_CARE_PROVIDER_SITE_OTHER): Payer: PPO | Admitting: Family Medicine

## 2016-11-09 ENCOUNTER — Encounter (INDEPENDENT_AMBULATORY_CARE_PROVIDER_SITE_OTHER): Payer: Self-pay

## 2016-11-09 VITALS — BP 124/79 | HR 74 | Temp 98.8°F | Ht 68.0 in | Wt 231.0 lb

## 2016-11-09 DIAGNOSIS — R7303 Prediabetes: Secondary | ICD-10-CM

## 2016-11-09 DIAGNOSIS — Z6835 Body mass index (BMI) 35.0-35.9, adult: Secondary | ICD-10-CM | POA: Diagnosis not present

## 2016-11-09 DIAGNOSIS — E559 Vitamin D deficiency, unspecified: Secondary | ICD-10-CM | POA: Insufficient documentation

## 2016-11-09 DIAGNOSIS — Z6833 Body mass index (BMI) 33.0-33.9, adult: Secondary | ICD-10-CM

## 2016-11-09 DIAGNOSIS — E669 Obesity, unspecified: Secondary | ICD-10-CM

## 2016-11-09 DIAGNOSIS — IMO0001 Reserved for inherently not codable concepts without codable children: Secondary | ICD-10-CM

## 2016-11-09 MED ORDER — METFORMIN HCL 500 MG PO TABS: 500.0000 mg | ORAL_TABLET | Freq: Two times a day (BID) | ORAL | 0 refills | Status: DC

## 2016-11-09 MED ORDER — VITAMIN D (ERGOCALCIFEROL) 1.25 MG (50000 UNIT) PO CAPS: 50000.0000 [IU] | ORAL_CAPSULE | ORAL | 0 refills | Status: DC

## 2016-11-09 NOTE — Telephone Encounter (Signed)
Vitamin D, Ergocalciferol, (DRISDOL) 50000 units CAPS capsule [facility] [place] sent request for refill 11/06/16 , what is status

## 2016-11-09 NOTE — Telephone Encounter (Signed)
Patient has appt today and we will refill at that time, although pt states Dr Leafy Ro doesn't prescribe this one.     Thanks, Ember Henrikson

## 2016-11-10 NOTE — Progress Notes (Signed)
Office: [phone]  /  Fax: [phone]   HPI:   Chief Complaint: OBESITY [name] is here to discuss her progress with her obesity treatment plan. She is on the  follow the Category 3 plan and is following her eating plan approximately 100 % of the time. She states she is walking, working outside and going to the gym for exercise 2-3 days per week.  Jaxon continues to do well with weight loss on Cat 3 plan. She has increased her yard work for exercise. Hunger is mostly controlled and she sometimes doesn't eat all her food.  Her weight is 231 lb (104.8 kg) today and has had a weight loss of 2 pounds over a period of 2 weeks since her last visit. She has lost 10 lbs since starting treatment with [place].  Pre-Diabetes [name] has a diagnosis of prediabetes based on her elevated HgA1c and was informed this puts her at greater risk of developing diabetes. She is taking metformin currently and continues to work on diet and exercise to decrease risk of diabetes. She denies nausea or hypoglycemia.  Vitamin D deficiency [name] has a diagnosis of vitamin D deficiency. She is currently taking vit D and denies nausea, vomiting or muscle weakness.   ALLERGIES: Allergies  Allergen Reactions  . Chloraprep One Step [Chlorhexidine Gluconate] Rash    Developed severe rash where chloraprep was used on chest area  . Ivp Dye [Iodinated Diagnostic Agents] Rash and Other (See Comments)    Flushing, minor facial rash & dyspnea  . Nalbuphine Shortness Of Breath and Rash    Nubain caused respiratory distress & rash  . Septra [Sulfamethoxazole-Trimethoprim] Shortness Of Breath and Rash  . Sulfamethoxazole-Trimethoprim Rash    rash  . Tylox [Oxycodone-Acetaminophen] Rash    MEDICATIONS: Current Outpatient Prescriptions on File Prior to Visit  Medication Sig Dispense Refill  . aspirin 81 MG tablet Take 81 mg by mouth daily.     [name] buPROPion (WELLBUTRIN SR) 150 MG 12 hr tablet Take 1 tablet (150 mg  total) by mouth daily. 30 tablet 0  . cetirizine (ZYRTEC) 10 MG chewable tablet Chew 10 mg by mouth daily.    . cholecalciferol (VITAMIN D) 1000 UNITS tablet Take 1,000 Units by mouth daily.    [name] DOXEPIN HCL PO Take by mouth at bedtime.    . DULoxetine (CYMBALTA) 60 MG capsule Take 60 mg by mouth 2 (two) times daily.    [name] gabapentin (NEURONTIN) 100 MG capsule Take 2 capsules (200 mg total) by mouth at bedtime. 60 capsule 3  . hydrochlorothiazide (MICROZIDE) 12.5 MG capsule Take 1 capsule (12.5 mg total) by mouth daily. 90 capsule 2  . HYDROcodone-acetaminophen (NORCO/VICODIN) 5-325 MG tablet Take 1 tablet by mouth every 6 (six) hours as needed for moderate pain.    . hydroxychloroquine (PLAQUENIL) 200 MG tablet Take 400 mg by mouth daily.    [name] lisinopril (PRINIVIL,ZESTRIL) 40 MG tablet TAKE 1 TABLET EVERY DAY 90 tablet 2   No current facility-administered medications on file prior to visit.     PAST MEDICAL HISTORY: Past Medical History:  Diagnosis Date  . Anemia   . Anxiety   . Arthritis    inflammitory  . Asthma   . B12 deficiency   . Back pain   . Chronic joint pain   . Constipation   . DDD (degenerative disc disease), lumbar    low back and neck and shoulders  . Depression    during divorce & legal matters  .  Fatty liver   . Fibromyalgia   . Gallbladder problem   . GERD (gastroesophageal reflux disease)   . History of kidney stones    Dr Phebe Colla  . History of polycystic ovarian disease S/P BSO  . Hx of anxiety disorder   . Hypertension    a  . Hypothyroidism    no meds  . IBS (irritable bowel syndrome)   . IBS (irritable bowel syndrome)   . Infertility, female   . Joint pain   . Kidney problem   . Lactose intolerance   . Lupus hx positive ANA   followed by Dr Gavin Pound; possible lupus  . Multiple food allergies   . Myalgia   . Osteoarthritis   . 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 POLYCYSTIC OVARIAN DISEASE 03/02/2007   Qualifier: Diagnosis of  By:  Linna Darner MD, Rae Mar BSO for cysts & endometriosis; Dr Reino Kent, Gyn Seeing Dr Paula Compton    . PONV (postoperative nausea and vomiting)   . Prediabetes   . Sweating profusely   . Symptomatic mammary hypertrophy   . URI (upper respiratory infection)    currently on cefdinir    PAST SURGICAL HISTORY: Past Surgical History:  Procedure Laterality Date  . [place]   exploratory lap  . BREAST REDUCTION SURGERY Bilateral 03/26/2015   Procedure: BILATERAL BREAST REDUCTION ;  Surgeon: Wallace Going, DO;  Location: [facility];  Service: Plastics;  Laterality: Bilateral;  . CARPAL TUNNEL RELEASE     bilateral; Dr Sherwood Gambler  . COLONOSCOPY     in 1990s  . CYSTO/ RIGHT RETROGRADE URETERAL PYELOGRAM  07-09-2004   HX BILATERAL RENAL STONES/ RIGHT FLANK PAIN  . CYSTOSCOPY W/ URETERAL STENT PLACEMENT  11/12/2011   Procedure: CYSTOSCOPY WITH RETROGRADE PYELOGRAM/URETERAL STENT PLACEMENT;  Surgeon: Alexis Frock, MD;  Location: [facility];  Service: Urology;  Laterality: Right;  . CYSTOSCOPY W/ URETERAL STENT REMOVAL  12/07/2011   Procedure: CYSTOSCOPY WITH STENT REMOVAL;  Surgeon: Alexis Frock, MD;  Location: [facility];  Service: Urology;  Laterality: Right;  . CYSTOSCOPY/RETROGRADE/URETEROSCOPY/STONE EXTRACTION WITH BASKET  12/07/2011   Procedure: CYSTOSCOPY/RETROGRADE/URETEROSCOPY/STONE EXTRACTION WITH BASKET;  Surgeon: Alexis Frock, MD;  Location: [facility];  Service: Urology;  Laterality: Right;  . DILATION AND CURETTAGE OF UTERUS     X5  . EXCISION LEFT BARTHOLIN GLAND  02-05-2002  . EYE SURGERY     Retinal tears surgery bilateral  . LAPAROSCOPIC ASSISTED VAGINAL HYSTERECTOMY  2000  . LAPAROSCOPIC CHOLECYSTECTOMY  05-31-2007  . LAPAROSCOPIC LASER ABLATION ENDOMETRIOSIS AND LEFT SALPINGO-OOPHORECTOMY  11-03-1999  . LAPAROSCOPY WITH RIGHT SALPINGO-OOPHECTOMY/ LYSIS ADHESIONS AND ABLATION ENDOMETRIOSIS   05-17-2001  . LIPOSUCTION Bilateral 03/26/2015   Procedure: WITH LIPOSUCTION;  Surgeon: Wallace Going, DO;  Location: [place];  Service: Plastics;  Laterality: Bilateral;  . LUMBAR LAMINECTOMY  2003   L4 - L5  . PLANTAR FASCIA SURGERY     right  . PLANTAR FASCIA SURGERY  02/2011   left  . RIGHT URETEROSCOPIC STONE EXTRACTION  08-04-2000  . TONSILLECTOMY    . URETERAL STENT PLACEMENT  11/12/11   Dr Tresa Moore    SOCIAL HISTORY: Social History  Substance Use Topics  . Smoking status: Never Smoker  . Smokeless tobacco: Never Used  . Alcohol use 0.6 oz/week    1 Shots of liquor per week     Comment:  very rarely  FAMILY HISTORY: Family History  Problem Relation Age of Onset  . Hypertension Mother   . Colon polyps Mother   . Atrial fibrillation Mother        Dr Angelena Form  . Hyperlipidemia Mother   . Heart disease Mother   . Anxiety disorder Mother   . Sleep apnea Mother   . Obesity Mother   . Heart attack Father 7  . Diabetes Father   . Hypertension Father   . Hyperlipidemia Father   . Heart disease Father   . Stroke Father   . Obesity Father   . Hypertension Sister   . Heart attack Paternal Grandmother 79  . Breast cancer Paternal Aunt   . Colon cancer Maternal Uncle   . Colon cancer Paternal Uncle   . Colon polyps Maternal Grandmother   . Stroke Maternal Grandmother 76  . Diabetes Paternal Grandfather   . Stroke Maternal Grandfather 37    ROS: Review of Systems  Constitutional: Positive for weight loss.  All other systems reviewed and are negative.   PHYSICAL EXAM: Blood pressure 124/79, pulse 74, temperature 98.8 F (37.1 C), temperature source Oral, height 5\' 8"  (1.727 m), weight 231 lb (104.8 kg), SpO2 98 %. Body mass index is 35.12 kg/m. Physical Exam  Constitutional: She is oriented to person, place, and time. [name].  Eyes: EOM are normal.  Neck: Normal range of motion.    Pulmonary/Chest: Effort normal.  Musculoskeletal: Normal range of motion.  Neurological: She is alert and oriented to person, place, and time.  Skin: Skin is warm and dry.  Psychiatric: She has a normal mood and affect.  Vitals reviewed.   RECENT LABS AND TESTS: BMET    Component Value Date/Time   NA 141 09/28/2016 1043   K 4.4 09/28/2016 1043   CL 104 09/28/2016 1043   CO2 26 09/28/2016 1043   GLUCOSE 83 09/28/2016 1043   GLUCOSE 92 08/16/2016 1011   BUN 19 09/28/2016 1043   CREATININE 0.64 09/28/2016 1043   CALCIUM 9.0 09/28/2016 1043   GFRNONAA 107 09/28/2016 1043   GFRAA 124 09/28/2016 1043   Lab Results  Component Value Date   HGBA1C 5.6 09/28/2016   HGBA1C 5.9 08/16/2016   HGBA1C 5.9 06/04/2015   HGBA1C 5.6 09/22/2014   HGBA1C 5.8 04/29/2013   Lab Results  Component Value Date   INSULIN 14.7 09/28/2016   CBC    Component Value Date/Time   WBC 6.6 08/16/2016 1011   RBC 4.68 08/16/2016 1011   HGB 13.2 08/16/2016 1011   HCT 39.4 08/16/2016 1011   PLT 235.0 08/16/2016 1011   MCV 84.2 08/16/2016 1011   MCH 27.0 04/19/2015 0650   MCHC 33.4 08/16/2016 1011   RDW 13.9 08/16/2016 1011   LYMPHSABS 1.8 08/16/2016 1011   MONOABS 0.6 08/16/2016 1011   EOSABS 0.2 08/16/2016 1011   BASOSABS 0.0 08/16/2016 1011   Iron/TIBC/Ferritin/ %Sat No results found for: IRON, TIBC, FERRITIN, IRONPCTSAT Lipid Panel     Component Value Date/Time   CHOL 153 08/16/2016 1011   TRIG 61.0 08/16/2016 1011   HDL 55.40 08/16/2016 1011   CHOLHDL 3 08/16/2016 1011   VLDL 12.2 08/16/2016 1011   LDLCALC 85 08/16/2016 1011   Hepatic Function Panel     Component Value Date/Time   PROT 6.8 09/28/2016 1043   ALBUMIN 4.1 09/28/2016 1043   AST 30 09/28/2016 1043   ALT 39 (H) 09/28/2016 1043   ALKPHOS 115 09/28/2016 1043  BILITOT 0.4 09/28/2016 1043   BILIDIR 0.1 06/12/2013 1632   IBILI 0.2 06/11/2009 2309      Component Value Date/Time   TSH 1.54 08/16/2016 1011   TSH 2.40  06/04/2015 0857   TSH 2.45 02/16/2015 1706    ASSESSMENT AND PLAN: Prediabetes - Plan: metFORMIN (GLUCOPHAGE) 500 MG tablet  Vitamin D deficiency - Plan: Vitamin D, Ergocalciferol, (DRISDOL) 50000 units CAPS capsule  Class 2 obesity with serious comorbidity and body mass index (BMI) of 35.0 to 35.9 in adult, unspecified obesity type  PLAN:  Pre-Diabetes Cliffie will continue to work on weight loss, exercise, and decreasing simple carbohydrates in her diet to help decrease the risk of diabetes. We dicussed metformin including benefits and risks. She was informed that eating too many simple carbohydrates or too many calories at one sitting increases the likelihood of GI side effects. [name] requested metformin for now and a prescription was written today for # 30 and no refills. Katalena agreed to follow up with [place] as directed to monitor her progress.  Vitamin D Deficiency Tanairy was informed that low vitamin D levels contributes to fatigue and are associated with obesity, breast, and colon cancer. She agrees to continue to take prescription Vit D @50 ,000 IU every week and will follow up for routine testing of vitamin D, at least 2-3 times per year. She was informed of the risk of over-replacement of vitamin D and agrees to not increase her dose unless he discusses this with [place] first. A prescription was written today for #30 and no refills.   Obesity Salley is currently in the action stage of change. As such, her goal is to continue with weight loss efforts She has agreed to follow the Category 3 plan with journaling 300-500 calories and 30+ protein for breakfast. Naleyah has been instructed to work up to a goal of 150 minutes of combined cardio and strengthening exercise per week for weight loss and overall health benefits. We discussed the following Behavioral Modification Stratagies today: increasing lean protein intake, decreasing simple carbohydrates , increase H20 intake, and no  skipping meals  Tejasvi has agreed to follow up with our clinic in 2-3 weeks. She was informed of the importance of frequent follow up visits to maximize her success with intensive lifestyle modifications for her multiple health conditions.  I, April Moore, am acting as Location manager for Dennard Nip, MD  I have reviewed the above documentation for accuracy and completeness, and I agree with the above. -Dennard Nip, MD

## 2016-11-15 ENCOUNTER — Ambulatory Visit: Payer: PPO | Admitting: Family Medicine

## 2016-11-24 ENCOUNTER — Ambulatory Visit (INDEPENDENT_AMBULATORY_CARE_PROVIDER_SITE_OTHER): Payer: PPO | Admitting: Internal Medicine

## 2016-11-24 ENCOUNTER — Encounter: Payer: Self-pay | Admitting: Internal Medicine

## 2016-11-24 VITALS — BP 112/76 | HR 90 | Temp 98.8°F | Resp 16 | Wt 231.0 lb

## 2016-11-24 DIAGNOSIS — J45901 Unspecified asthma with (acute) exacerbation: Secondary | ICD-10-CM | POA: Diagnosis not present

## 2016-11-24 DIAGNOSIS — J45909 Unspecified asthma, uncomplicated: Secondary | ICD-10-CM | POA: Insufficient documentation

## 2016-11-24 DIAGNOSIS — J069 Acute upper respiratory infection, unspecified: Secondary | ICD-10-CM | POA: Diagnosis not present

## 2016-11-24 MED ORDER — FLUTICASONE-SALMETEROL 100-50 MCG/DOSE IN AEPB: 1.0000 | INHALATION_SPRAY | Freq: Two times a day (BID) | RESPIRATORY_TRACT | 5 refills | Status: DC

## 2016-11-24 MED ORDER — MONTELUKAST SODIUM 10 MG PO TABS: 10.0000 mg | ORAL_TABLET | Freq: Every day | ORAL | 3 refills | Status: DC

## 2016-11-24 NOTE — Progress Notes (Signed)
Subjective:    Patient ID: [name], female    DOB: June 22, 1970, 46 y.o.   MRN: [id number]  HPI She is here for an acute visit for cold symptoms.   Her symptoms started 2 weeks ago.   She is experiencing wheezing, cough that is sometimes productive, SOB, fatigue, sinus pressure, sneezing, headaches, lightheadedness She is unsure if the allies or infection. Nothing seems to be helping. She does have a history of asthma.  She has tried taking zyrtec.  She took days of Keflex she had from a foot doctor from January.  This did not help.     Medications and allergies reviewed with patient and updated if appropriate.  Patient Active Problem List   Diagnosis Date Noted  . Vitamin D deficiency 11/09/2016  . Class 2 obesity with serious comorbidity and body mass index (BMI) of 35.0 to 35.9 in adult 11/09/2016  . Right shoulder pain 10/11/2016  . AC (acromioclavicular) joint arthritis 09/13/2016  . Postsurgical menopause 08/16/2016  . Mucoid cyst of joint 11/04/2015  . External hemorrhoid 08/20/2015  . Prediabetes 06/05/2015  . Status post bilateral breast reduction 04/01/2015  . Cervical disc disorder with radiculopathy of cervical region 05/23/2014  . Neck pain 02/03/2014  . Ulnar neuropathy 01/16/2014  . Fibromyalgia 01/16/2014  . Recurrent nephrolithiasis 04/29/2013  . Arthralgia 04/29/2013  . Panic attacks 01/15/2013  . IBS (irritable bowel syndrome) 12/14/2011  . Premature menopause on hormone replacement therapy 09/02/2011  . B12 deficiency 06/30/2009  . Hypothyroidism 03/02/2007  . Essential hypertension 03/02/2007    Current Outpatient Prescriptions on File Prior to Visit  Medication Sig Dispense Refill  . aspirin 81 MG tablet Take 81 mg by mouth daily.     [name] buPROPion (WELLBUTRIN SR) 150 MG 12 hr tablet Take 1 tablet (150 mg total) by mouth daily. 30 tablet 0  . cetirizine (ZYRTEC) 10 MG chewable tablet Chew 10 mg by mouth daily.    [name] DOXEPIN HCL PO Take  by mouth at bedtime.    . DULoxetine (CYMBALTA) 60 MG capsule Take 60 mg by mouth 2 (two) times daily.    [name] gabapentin (NEURONTIN) 100 MG capsule Take 2 capsules (200 mg total) by mouth at bedtime. 60 capsule 3  . hydrochlorothiazide (MICROZIDE) 12.5 MG capsule Take 1 capsule (12.5 mg total) by mouth daily. 90 capsule 2  . lisinopril (PRINIVIL,ZESTRIL) 40 MG tablet TAKE 1 TABLET EVERY DAY 90 tablet 2  . metFORMIN (GLUCOPHAGE) 500 MG tablet Take 1 tablet (500 mg total) by mouth 2 (two) times daily. 60 tablet 0  . Vitamin D, Ergocalciferol, (DRISDOL) 50000 units CAPS capsule Take 1 capsule (50,000 Units total) by mouth every 7 (seven) days. 4 capsule 0   No current facility-administered medications on file prior to visit.     Past Medical History:  Diagnosis Date  . Anemia   . Anxiety   . Arthritis    inflammitory  . Asthma   . B12 deficiency   . Back pain   . Chronic joint pain   . Constipation   . DDD (degenerative disc disease), lumbar    low back and neck and shoulders  . Depression    during divorce & legal matters  . Fatty liver   . Fibromyalgia   . Gallbladder problem   . GERD (gastroesophageal reflux disease)   . History of kidney stones    Dr Phebe Colla  . History of polycystic ovarian disease S/P BSO  . Hx  of anxiety disorder   . Hypertension    a  . Hypothyroidism    no meds  . IBS (irritable bowel syndrome)   . IBS (irritable bowel syndrome)   . Infertility, female   . Joint pain   . Kidney problem   . Lactose intolerance   . Lupus hx positive ANA   followed by Dr Gavin Pound; possible lupus  . Multiple food allergies   . Myalgia   . Osteoarthritis   . 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 POLYCYSTIC OVARIAN DISEASE 03/02/2007   Qualifier: Diagnosis of  By: Linna Darner MD, Rae Mar BSO for cysts & endometriosis; Dr Reino Kent, Gyn Seeing Dr Paula Compton    . PONV (postoperative nausea and vomiting)   . Prediabetes   . Sweating profusely   .  Symptomatic mammary hypertrophy   . URI (upper respiratory infection)    currently on cefdinir    Past Surgical History:  Procedure Laterality Date  . [place]   exploratory lap  . BREAST REDUCTION SURGERY Bilateral 03/26/2015   Procedure: BILATERAL BREAST REDUCTION ;  Surgeon: Wallace Going, DO;  Location: [place];  Service: Plastics;  Laterality: Bilateral;  . CARPAL TUNNEL RELEASE     bilateral; Dr Sherwood Gambler  . COLONOSCOPY     in 1990s  . CYSTO/ RIGHT RETROGRADE URETERAL PYELOGRAM  07-09-2004   HX BILATERAL RENAL STONES/ RIGHT FLANK PAIN  . CYSTOSCOPY W/ URETERAL STENT PLACEMENT  11/12/2011   Procedure: CYSTOSCOPY WITH RETROGRADE PYELOGRAM/URETERAL STENT PLACEMENT;  Surgeon: Alexis Frock, MD;  Location: [facility];  Service: Urology;  Laterality: Right;  . CYSTOSCOPY W/ URETERAL STENT REMOVAL  12/07/2011   Procedure: CYSTOSCOPY WITH STENT REMOVAL;  Surgeon: Alexis Frock, MD;  Location: [facility];  Service: Urology;  Laterality: Right;  . CYSTOSCOPY/RETROGRADE/URETEROSCOPY/STONE EXTRACTION WITH BASKET  12/07/2011   Procedure: CYSTOSCOPY/RETROGRADE/URETEROSCOPY/STONE EXTRACTION WITH BASKET;  Surgeon: Alexis Frock, MD;  Location: [facility];  Service: Urology;  Laterality: Right;  . DILATION AND CURETTAGE OF UTERUS     X5  . EXCISION LEFT BARTHOLIN GLAND  02-05-2002  . EYE SURGERY     Retinal tears surgery bilateral  . LAPAROSCOPIC ASSISTED VAGINAL HYSTERECTOMY  2000  . LAPAROSCOPIC CHOLECYSTECTOMY  05-31-2007  . LAPAROSCOPIC LASER ABLATION ENDOMETRIOSIS AND LEFT SALPINGO-OOPHORECTOMY  11-03-1999  . LAPAROSCOPY WITH RIGHT SALPINGO-OOPHECTOMY/ LYSIS ADHESIONS AND ABLATION ENDOMETRIOSIS  05-17-2001  . LIPOSUCTION Bilateral 03/26/2015   Procedure: WITH LIPOSUCTION;  Surgeon: Wallace Going, DO;  Location: [place];  Service: Plastics;  Laterality: Bilateral;  . LUMBAR  LAMINECTOMY  2003   L4 - L5  . PLANTAR FASCIA SURGERY     right  . PLANTAR FASCIA SURGERY  02/2011   left  . RIGHT URETEROSCOPIC STONE EXTRACTION  08-04-2000  . TONSILLECTOMY    . URETERAL STENT PLACEMENT  11/12/11   Dr Tresa Moore    Social History   Social History  . Marital status: Married    Spouse name: Darnelle Maffucci  . Number of children: 0  . Years of education: college   Occupational History  . disabled    Social History Main Topics  . Smoking status: Never Smoker  . Smokeless tobacco: Never Used  . Alcohol use 0.6 oz/week    1 Shots of liquor per week     Comment:  very rarely  . Drug use: No  . Sexual activity: Yes  Birth control/ protection: Surgical   Other Topics Concern  . Not on file   Social History Narrative   Patient Lives at home with her husband Darnelle Maffucci)   Disabled.   Education two years of college.   Right handed.   Caffeine coffee and sweet tea. Not daily.    Family History  Problem Relation Age of Onset  . Hypertension Mother   . Colon polyps Mother   . Atrial fibrillation Mother        Dr Angelena Form  . Hyperlipidemia Mother   . Heart disease Mother   . Anxiety disorder Mother   . Sleep apnea Mother   . Obesity Mother   . Heart attack Father 29  . Diabetes Father   . Hypertension Father   . Hyperlipidemia Father   . Heart disease Father   . Stroke Father   . Obesity Father   . Hypertension Sister   . Heart attack Paternal Grandmother 19  . Breast cancer Paternal Aunt   . Colon cancer Maternal Uncle   . Colon cancer Paternal Uncle   . Colon polyps Maternal Grandmother   . Stroke Maternal Grandmother 76  . Diabetes Paternal Grandfather   . Stroke Maternal Grandfather 37    Review of Systems  Constitutional: Positive for fatigue. Negative for chills and fever.  HENT: Positive for sinus pressure and sneezing. Negative for ear pain, postnasal drip and sore throat.   Respiratory: Positive for cough (sometimes productive - white, yellow),  shortness of breath and wheezing.   Gastrointestinal: Positive for diarrhea. Negative for nausea.  Musculoskeletal: Negative for myalgias.  Neurological: Positive for light-headedness (occ) and headaches.       Objective:   Vitals:   11/24/16 1132  BP: 112/76  Pulse: 90  Resp: 16  Temp: 98.8 F (37.1 C)  SpO2: 97%   Filed Weights   11/24/16 1132  Weight: 231 lb (104.8 kg)   Body mass index is 35.12 kg/m.  Wt Readings from Last 3 Encounters:  11/24/16 231 lb (104.8 kg)  11/09/16 231 lb (104.8 kg)  10/26/16 233 lb (105.7 kg)     Physical Exam GENERAL APPEARANCE: Appears stated age, well appearing, NAD EYES: conjunctiva clear, no icterus HEENT: bilateral tympanic membranes and ear canals normal, oropharynx with mild erythema, no thyromegaly, trachea midline, no cervical or supraclavicular lymphadenopathy LUNGS: Clear to auscultation without wheeze or crackles, unlabored breathing, good air entry bilaterally HEART: Normal S1,S2 without murmurs EXTREMITIES: Without clubbing, cyanosis, or edema        Assessment & Plan:   See Problem List for Assessment and Plan of chronic medical problems.

## 2016-11-24 NOTE — Patient Instructions (Addendum)
Continue over-the- counter medications.  Try Xyzal instead of Zyrtec.   Use the Advair daily.    If your symptoms do not improve please let me know.

## 2016-11-24 NOTE — Assessment & Plan Note (Signed)
Start advair and singulair  Continue allergy medications Call if no improvement

## 2016-11-24 NOTE — Assessment & Plan Note (Signed)
Likely viral She did take keflex and there was no improvement Discussed symptomatic treatment Will treat asthma exac Call if no improvement

## 2016-11-29 ENCOUNTER — Encounter (INDEPENDENT_AMBULATORY_CARE_PROVIDER_SITE_OTHER): Payer: Self-pay

## 2016-11-29 ENCOUNTER — Ambulatory Visit (INDEPENDENT_AMBULATORY_CARE_PROVIDER_SITE_OTHER): Payer: PPO | Admitting: Family Medicine

## 2016-12-06 ENCOUNTER — Ambulatory Visit (INDEPENDENT_AMBULATORY_CARE_PROVIDER_SITE_OTHER): Payer: PPO | Admitting: Family Medicine

## 2016-12-06 VITALS — BP 129/79 | HR 75 | Temp 98.4°F | Ht 68.0 in | Wt 235.0 lb

## 2016-12-06 DIAGNOSIS — F3289 Other specified depressive episodes: Secondary | ICD-10-CM | POA: Diagnosis not present

## 2016-12-06 DIAGNOSIS — E669 Obesity, unspecified: Secondary | ICD-10-CM

## 2016-12-06 DIAGNOSIS — E559 Vitamin D deficiency, unspecified: Secondary | ICD-10-CM | POA: Diagnosis not present

## 2016-12-06 DIAGNOSIS — Z6835 Body mass index (BMI) 35.0-35.9, adult: Secondary | ICD-10-CM | POA: Diagnosis not present

## 2016-12-06 DIAGNOSIS — IMO0001 Reserved for inherently not codable concepts without codable children: Secondary | ICD-10-CM

## 2016-12-06 MED ORDER — BUPROPION HCL ER (SR) 150 MG PO TB12: 150.0000 mg | ORAL_TABLET | Freq: Every day | ORAL | 0 refills | Status: DC

## 2016-12-06 MED ORDER — VITAMIN D (ERGOCALCIFEROL) 1.25 MG (50000 UNIT) PO CAPS: 50000.0000 [IU] | ORAL_CAPSULE | ORAL | 0 refills | Status: DC

## 2016-12-08 NOTE — Progress Notes (Signed)
Office: [phone]  /  Fax: [phone]   HPI:   Chief Complaint: OBESITY [name] is here to discuss her progress with her obesity treatment plan. She is on the Category 3 plan and is following her eating plan approximately 85 % of the time. She states she is exercising 0 minutes 0 times per week. [name] has been off track for the past few weeks and increased emotional eating. She is deviating from the plan more, especially for dinner. She is ready to get back on track. Her weight is 235 lb (106.6 kg) today and has had a weight gain of 4 pounds over a period of 4 weeks since her last visit. She has lost 6 lbs since starting treatment with [place].  Vitamin D deficiency [name] has a diagnosis of vitamin D deficiency. She is currently stable on vit D and denies nausea, vomiting or muscle weakness.  Depression with emotional eating behaviors [name] has been out of Wellbutrin for 5 days and notes increased emotional eating. She would like to restart. She is on Cymbalta currently. [name] struggles with emotional eating and using food for comfort to the extent that it is negatively impacting her health. She often snacks when she is not hungry. [name] sometimes feels she is out of control and then feels guilty that she made poor food choices. She has been working on behavior modification techniques to help reduce her emotional eating and has been somewhat successful. She shows no sign of Frances or homicidal ideations.  Depression screen [name] 2/9 09/28/2016 07/29/2015 09/22/2014  [name] 2 0 0  [name], [name], [name] 1 0 0  [name] 3 0 0  [name] 1 - -  [name], [name] 2 - -  [name] 1 - -  [name]  1 - -  [name] 2 - -  [name] 1 - -  [name] 0 - -  [name] 11 - -  Some recent data might be hidden      [name]: [name]  Allergen Reactions  .  Frances One Step [Chlorhexidine Gluconate] Rash    Developed severe rash where Frances was used on chest area  . Ivp Dye [Iodinated Diagnostic Agents] Rash and Other (See Comments)    Flushing, minor facial rash & dyspnea  . Nalbuphine Shortness Of Breath and Rash    Nubain caused respiratory distress & rash  . Septra [Sulfamethoxazole-Trimethoprim] Shortness Of Breath and Rash  . Sulfamethoxazole-Trimethoprim Rash    rash  . Tylox [Oxycodone-Acetaminophen] Rash    MEDICATIONS: Current Outpatient Prescriptions on File Prior to Visit  Medication Sig Dispense Refill  . aspirin 81 MG tablet Take 81 mg by mouth daily.     . cetirizine (ZYRTEC) 10 MG chewable tablet Chew 10 mg by mouth daily.    [name] DOXEPIN HCL PO Take by mouth at bedtime.    . DULoxetine (CYMBALTA) 60 MG capsule Take 60 mg by mouth 3 (three) times daily.     . Fluticasone-Salmeterol (ADVAIR DISKUS) 100-50 MCG/DOSE AEPB Inhale 1 puff into the lungs 2 (two) times daily. 60 each 5  . gabapentin (NEURONTIN) 100 MG capsule Take 2 capsules (200 mg total) by mouth at bedtime. (Patient taking differently: Take 100 mg by mouth at bedtime. ) 60 capsule 3  . hydrochlorothiazide (MICROZIDE) 12.5 MG capsule Take 1 capsule (12.5 mg total) by mouth daily. 90 capsule 2  . lisinopril (PRINIVIL,ZESTRIL) 40 MG tablet TAKE  1 TABLET EVERY DAY 90 tablet 2  . metFORMIN (GLUCOPHAGE) 500 MG tablet Take 1 tablet (500 mg total) by mouth 2 (two) times daily. 60 tablet 0  . montelukast (SINGULAIR) 10 MG tablet Take 1 tablet (10 mg total) by mouth at bedtime. 90 tablet 3   No current facility-administered medications on file prior to visit.     PAST MEDICAL HISTORY: Past Medical History:  Diagnosis Date  . Anemia   . Anxiety   . Arthritis    inflammitory  . Asthma   . B12 deficiency   . Back pain   . Chronic joint pain   . Constipation   . DDD (degenerative disc disease), lumbar    low back and neck and shoulders  . Depression     during divorce & legal matters  . Fatty liver   . Fibromyalgia   . Gallbladder problem   . GERD (gastroesophageal reflux disease)   . History of kidney stones    Dr Phebe Colla  . History of polycystic ovarian disease S/P BSO  . Hx of anxiety disorder   . Hypertension    a  . Hypothyroidism    no meds  . IBS (irritable bowel syndrome)   . IBS (irritable bowel syndrome)   . Infertility, female   . Joint pain   . Kidney problem   . Lactose intolerance   . Lupus hx positive ANA   followed by Dr Gavin Pound; possible lupus  . Multiple food [name]   . Myalgia   . Osteoarthritis   . 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 POLYCYSTIC OVARIAN DISEASE 03/02/2007   Qualifier: Diagnosis of  By: Linna Darner MD, Rae Mar BSO for cysts & endometriosis; Dr Reino Kent, Gyn Seeing Dr Paula Compton    . PONV (postoperative nausea and vomiting)   . Prediabetes   . Sweating profusely   . Symptomatic mammary hypertrophy   . URI (upper respiratory infection)    currently on cefdinir    PAST SURGICAL HISTORY: Past Surgical History:  Procedure Laterality Date  . [place]   exploratory lap  . BREAST REDUCTION SURGERY Bilateral 03/26/2015   Procedure: BILATERAL BREAST REDUCTION ;  Surgeon: Wallace Going, DO;  Location: [facility];  Service: Plastics;  Laterality: Bilateral;  . CARPAL TUNNEL RELEASE     bilateral; Dr Sherwood Gambler  . COLONOSCOPY     in 1990s  . CYSTO/ RIGHT RETROGRADE URETERAL PYELOGRAM  07-09-2004   HX BILATERAL RENAL STONES/ RIGHT FLANK PAIN  . CYSTOSCOPY W/ URETERAL STENT PLACEMENT  11/12/2011   Procedure: CYSTOSCOPY WITH RETROGRADE PYELOGRAM/URETERAL STENT PLACEMENT;  Surgeon: Alexis Frock, MD;  Location: [facility];  Service: Urology;  Laterality: Right;  . CYSTOSCOPY W/ URETERAL STENT REMOVAL  12/07/2011   Procedure: CYSTOSCOPY WITH STENT REMOVAL;  Surgeon: Alexis Frock, MD;  Location: [name];   Service: Urology;  Laterality: Right;  . CYSTOSCOPY/RETROGRADE/URETEROSCOPY/STONE EXTRACTION WITH BASKET  12/07/2011   Procedure: CYSTOSCOPY/RETROGRADE/URETEROSCOPY/STONE EXTRACTION WITH BASKET;  Surgeon: Alexis Frock, MD;  Location: [facility];  Service: Urology;  Laterality: Right;  . DILATION AND CURETTAGE OF UTERUS     X5  . EXCISION LEFT BARTHOLIN GLAND  02-05-2002  . EYE SURGERY     Retinal tears surgery bilateral  . LAPAROSCOPIC ASSISTED VAGINAL HYSTERECTOMY  2000  . LAPAROSCOPIC CHOLECYSTECTOMY  05-31-2007  . LAPAROSCOPIC LASER ABLATION ENDOMETRIOSIS AND LEFT SALPINGO-OOPHORECTOMY  11-03-1999  .  LAPAROSCOPY WITH RIGHT SALPINGO-OOPHECTOMY/ LYSIS ADHESIONS AND ABLATION ENDOMETRIOSIS  05-17-2001  . LIPOSUCTION Bilateral 03/26/2015   Procedure: WITH LIPOSUCTION;  Surgeon: Wallace Going, DO;  Location: [facility];  Service: Plastics;  Laterality: Bilateral;  . LUMBAR LAMINECTOMY  2003   L4 - L5  . PLANTAR FASCIA SURGERY     right  . PLANTAR FASCIA SURGERY  02/2011   left  . RIGHT URETEROSCOPIC STONE EXTRACTION  08-04-2000  . TONSILLECTOMY    . URETERAL STENT PLACEMENT  11/12/11   Dr Tresa Moore    SOCIAL HISTORY: Social History  Substance Use Topics  . Smoking status: Never Smoker  . Smokeless tobacco: Never Used  . Alcohol use 0.6 oz/week    1 Shots of liquor per week     Comment:  very rarely    FAMILY HISTORY: Family History  Problem Relation Age of Onset  . Hypertension Mother   . Colon polyps Mother   . Atrial fibrillation Mother        Dr Angelena Form  . Hyperlipidemia Mother   . Heart disease Mother   . Anxiety disorder Mother   . Sleep apnea Mother   . Obesity Mother   . Heart attack Father 18  . Diabetes Father   . Hypertension Father   . Hyperlipidemia Father   . Heart disease Father   . Stroke Father   . Obesity Father   . Hypertension Sister   . Heart attack Paternal Grandmother 11  . Breast cancer Paternal Aunt   .  Colon cancer Maternal Uncle   . Colon cancer Paternal Uncle   . Colon polyps Maternal Grandmother   . Stroke Maternal Grandmother 76  . Diabetes Paternal Grandfather   . Stroke Maternal Grandfather 37    ROS: Review of Systems  Constitutional: Negative for weight loss.  Gastrointestinal: Negative for nausea and vomiting.  Musculoskeletal:       Negative muscle weakness  Psychiatric/Behavioral: Positive for depression. Negative for Frances ideas.    PHYSICAL EXAM: Blood pressure 129/79, pulse 75, temperature 98.4 F (36.9 C), temperature source Oral, height 5\' 8"  (1.727 m), weight 235 lb (106.6 kg), SpO2 99 %. Body mass index is 35.73 kg/m. Physical Exam  Constitutional: She is oriented to person, place, and time. [name].  Cardiovascular: Normal rate.   Pulmonary/Chest: Effort normal.  Musculoskeletal: Normal range of motion.  Neurological: She is oriented to person, place, and time.  Skin: Skin is warm and dry.  Vitals reviewed.   RECENT LABS AND TESTS: BMET    Component Value Date/Time   NA 141 09/28/2016 1043   K 4.4 09/28/2016 1043   CL 104 09/28/2016 1043   CO2 26 09/28/2016 1043   GLUCOSE 83 09/28/2016 1043   GLUCOSE 92 08/16/2016 1011   BUN 19 09/28/2016 1043   CREATININE 0.64 09/28/2016 1043   CALCIUM 9.0 09/28/2016 1043   GFRNONAA 107 09/28/2016 1043   GFRAA 124 09/28/2016 1043   Lab Results  Component Value Date   HGBA1C 5.6 09/28/2016   HGBA1C 5.9 08/16/2016   HGBA1C 5.9 06/04/2015   HGBA1C 5.6 09/22/2014   HGBA1C 5.8 04/29/2013   Lab Results  Component Value Date   INSULIN 14.7 09/28/2016   CBC    Component Value Date/Time   WBC 6.6 08/16/2016 1011   RBC 4.68 08/16/2016 1011   HGB 13.2 08/16/2016 1011   HCT 39.4 08/16/2016 1011   PLT 235.0 08/16/2016 1011   MCV 84.2 08/16/2016  1011   MCH 27.0 04/19/2015 0650   MCHC 33.4 08/16/2016 1011   RDW 13.9 08/16/2016 1011   LYMPHSABS 1.8 08/16/2016 1011     MONOABS 0.6 08/16/2016 1011   EOSABS 0.2 08/16/2016 1011   BASOSABS 0.0 08/16/2016 1011   Iron/TIBC/Ferritin/ %Sat No results found for: IRON, TIBC, FERRITIN, IRONPCTSAT Lipid Panel     Component Value Date/Time   CHOL 153 08/16/2016 1011   TRIG 61.0 08/16/2016 1011   HDL 55.40 08/16/2016 1011   CHOLHDL 3 08/16/2016 1011   VLDL 12.2 08/16/2016 1011   LDLCALC 85 08/16/2016 1011   Hepatic Function Panel     Component Value Date/Time   PROT 6.8 09/28/2016 1043   ALBUMIN 4.1 09/28/2016 1043   AST 30 09/28/2016 1043   ALT 39 (H) 09/28/2016 1043   ALKPHOS 115 09/28/2016 1043   BILITOT 0.4 09/28/2016 1043   BILIDIR 0.1 06/12/2013 1632   IBILI 0.2 06/11/2009 2309      Component Value Date/Time   TSH 1.54 08/16/2016 1011   TSH 2.40 06/04/2015 0857   TSH 2.45 02/16/2015 1706    ASSESSMENT AND PLAN: Vitamin D deficiency - Plan: Vitamin D, Ergocalciferol, (DRISDOL) 50000 units CAPS capsule  Other depression - Plan: buPROPion (WELLBUTRIN SR) 150 MG 12 hr tablet  Class 2 obesity with serious comorbidity and body mass index (BMI) of 35.0 to 35.9 in adult, unspecified obesity type  PLAN:  Vitamin D Deficiency Maedell was informed that low vitamin D levels contributes to fatigue and are associated with obesity, breast, and colon cancer. She agrees to continue to take prescription Vit D @50 ,000 IU every week, we will refill for 1 month and will follow up for routine testing of vitamin D, at least 2-3 times per year. She was informed of the risk of over-replacement of vitamin D and agrees to not increase her dose unless he discusses this with [place] first. Tachina agrees to follow up with our clinic in 2 to 3 weeks.  Depression with Emotional Eating Behaviors We discussed behavior modification techniques today to help Tiauna deal with her emotional eating and depression. She has agreed to continue Wellbutrin SR 150 mg qd, we will refill for 1 month and she agreed to follow up as  directed.  Obesity Abbigael is currently in the action stage of Frances. As such, her goal is to continue with weight loss efforts She has agreed to keep a food journal with 450 to 650 calories and 40+ grams of protein at supper daily and follow the Category 3 plan Elsey has been instructed to work up to a goal of 150 minutes of combined cardio and strengthening exercise per week for weight loss and overall health benefits. We discussed the following Behavioral Modification Strategies today: increasing lean protein intake, decreasing simple carbohydrates , work on meal planning and easy cooking plans and emotional eating strategies  Saralee has agreed to follow up with our clinic in 2 to 3 weeks. She was informed of the importance of frequent follow up visits to maximize her success with intensive lifestyle modifications for her multiple health conditions.  I, Doreene Nest, am acting as transcriptionist for Dennard Nip, MD  I have reviewed the above documentation for accuracy and completeness, and I agree with the above. -Dennard Nip, MD   OBESITY BEHAVIORAL INTERVENTION VISIT  Today's visit was # 5 out of 22.  Starting weight: 241 lbs Starting date: 09/28/16 Today's weight : 235 lbs Today's date: 12/06/2016 Total lbs lost to date:  6 (Patients must lose 7 lbs in the first 6 months to continue with counseling)   ASK: We discussed the diagnosis of obesity with [place] today and Tanekia agreed to give [place] permission to discuss obesity behavioral modification therapy today.  ASSESS: Daijah has the diagnosis of obesity and her BMI today is 35.74 Ahmira is in the action stage of Frances   ADVISE: Safari was educated on the multiple health risks of obesity as well as the benefit of weight loss to improve her health. She was advised of the need for long term treatment and the importance of lifestyle modifications.  AGREE: Multiple dietary modification options  and treatment options were discussed and  Mackenize agreed to keep a food journal with 450 to 650 calories and 40+ grams of protein at supper daily and follow the Category 3 plan We discussed the following Behavioral Modification Strategies today: increasing lean protein intake, decreasing simple carbohydrates , work on meal planning and easy cooking plans and emotional eating strategies

## 2016-12-21 ENCOUNTER — Ambulatory Visit (INDEPENDENT_AMBULATORY_CARE_PROVIDER_SITE_OTHER): Payer: PPO | Admitting: Family Medicine

## 2016-12-21 VITALS — BP 149/84 | HR 86 | Temp 98.6°F | Ht 68.0 in | Wt 235.0 lb

## 2016-12-21 DIAGNOSIS — F3289 Other specified depressive episodes: Secondary | ICD-10-CM | POA: Diagnosis not present

## 2016-12-21 DIAGNOSIS — E669 Obesity, unspecified: Secondary | ICD-10-CM

## 2016-12-21 DIAGNOSIS — E559 Vitamin D deficiency, unspecified: Secondary | ICD-10-CM

## 2016-12-21 DIAGNOSIS — R7303 Prediabetes: Secondary | ICD-10-CM

## 2016-12-21 DIAGNOSIS — K581 Irritable bowel syndrome with constipation: Secondary | ICD-10-CM | POA: Diagnosis not present

## 2016-12-21 DIAGNOSIS — IMO0001 Reserved for inherently not codable concepts without codable children: Secondary | ICD-10-CM

## 2016-12-21 DIAGNOSIS — Z6835 Body mass index (BMI) 35.0-35.9, adult: Secondary | ICD-10-CM | POA: Diagnosis not present

## 2016-12-21 MED ORDER — METFORMIN HCL 500 MG PO TABS: 500.0000 mg | ORAL_TABLET | Freq: Two times a day (BID) | ORAL | 0 refills | Status: DC

## 2016-12-21 MED ORDER — VITAMIN D (ERGOCALCIFEROL) 1.25 MG (50000 UNIT) PO CAPS: 50000.0000 [IU] | ORAL_CAPSULE | ORAL | 0 refills | Status: DC

## 2016-12-21 MED ORDER — BUPROPION HCL ER (SR) 200 MG PO TB12: 200.0000 mg | ORAL_TABLET | Freq: Every day | ORAL | 0 refills | Status: DC

## 2016-12-21 MED ORDER — POLYETHYLENE GLYCOL 3350 17 GM/SCOOP PO POWD: 17.0000 g | Freq: Every day | ORAL | 0 refills | Status: DC
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
## 2016-12-25 ENCOUNTER — Encounter: Payer: Self-pay | Admitting: Internal Medicine

## 2016-12-26 ENCOUNTER — Encounter: Payer: Self-pay | Admitting: Internal Medicine

## 2016-12-26 MED ORDER — LISINOPRIL 40 MG PO TABS: ORAL_TABLET | ORAL | 2 refills | Status: DC

## 2016-12-26 MED ORDER — HYDROCHLOROTHIAZIDE 12.5 MG PO CAPS: 12.5000 mg | ORAL_CAPSULE | Freq: Every day | ORAL | 2 refills | Status: DC

## 2017-01-03 ENCOUNTER — Other Ambulatory Visit (INDEPENDENT_AMBULATORY_CARE_PROVIDER_SITE_OTHER): Payer: Self-pay | Admitting: Family Medicine

## 2017-01-03 DIAGNOSIS — F3289 Other specified depressive episodes: Secondary | ICD-10-CM

## 2017-01-04 DIAGNOSIS — M25511 Pain in right shoulder: Secondary | ICD-10-CM | POA: Diagnosis not present

## 2017-01-04 DIAGNOSIS — M159 Polyosteoarthritis, unspecified: Secondary | ICD-10-CM | POA: Diagnosis not present

## 2017-01-04 DIAGNOSIS — M25552 Pain in left hip: Secondary | ICD-10-CM | POA: Diagnosis not present

## 2017-01-04 DIAGNOSIS — E669 Obesity, unspecified: Secondary | ICD-10-CM | POA: Diagnosis not present

## 2017-01-04 DIAGNOSIS — M25512 Pain in left shoulder: Secondary | ICD-10-CM | POA: Diagnosis not present

## 2017-01-04 DIAGNOSIS — Z6836 Body mass index (BMI) 36.0-36.9, adult: Secondary | ICD-10-CM | POA: Diagnosis not present

## 2017-01-04 DIAGNOSIS — M064 Inflammatory polyarthropathy: Secondary | ICD-10-CM | POA: Diagnosis not present

## 2017-01-04 DIAGNOSIS — M2550 Pain in unspecified joint: Secondary | ICD-10-CM | POA: Diagnosis not present

## 2017-01-04 DIAGNOSIS — Z79899 Other long term (current) drug therapy: Secondary | ICD-10-CM | POA: Diagnosis not present

## 2017-01-04 DIAGNOSIS — M542 Cervicalgia: Secondary | ICD-10-CM | POA: Diagnosis not present

## 2017-01-11 ENCOUNTER — Ambulatory Visit (INDEPENDENT_AMBULATORY_CARE_PROVIDER_SITE_OTHER): Payer: PPO | Admitting: Family Medicine

## 2017-01-11 VITALS — BP 113/74 | HR 85 | Temp 98.7°F | Ht 68.0 in | Wt 230.0 lb

## 2017-01-11 DIAGNOSIS — E559 Vitamin D deficiency, unspecified: Secondary | ICD-10-CM | POA: Diagnosis not present

## 2017-01-11 DIAGNOSIS — R7303 Prediabetes: Secondary | ICD-10-CM

## 2017-01-11 DIAGNOSIS — Z6835 Body mass index (BMI) 35.0-35.9, adult: Secondary | ICD-10-CM

## 2017-01-11 DIAGNOSIS — I10 Essential (primary) hypertension: Secondary | ICD-10-CM

## 2017-01-11 DIAGNOSIS — F3289 Other specified depressive episodes: Secondary | ICD-10-CM

## 2017-01-11 MED ORDER — METFORMIN HCL 500 MG PO TABS: 500.0000 mg | ORAL_TABLET | Freq: Two times a day (BID) | ORAL | 0 refills | Status: DC

## 2017-01-11 MED ORDER — VITAMIN D (ERGOCALCIFEROL) 1.25 MG (50000 UNIT) PO CAPS: 50000.0000 [IU] | ORAL_CAPSULE | ORAL | 0 refills | Status: DC

## 2017-01-11 MED ORDER — BUPROPION HCL ER (SR) 200 MG PO TB12: 200.0000 mg | ORAL_TABLET | Freq: Every day | ORAL | 0 refills | Status: DC

## 2017-01-12 ENCOUNTER — Telehealth (INDEPENDENT_AMBULATORY_CARE_PROVIDER_SITE_OTHER): Payer: Self-pay | Admitting: Family Medicine

## 2017-01-12 NOTE — Progress Notes (Signed)
Office: [phone]  /  Fax: [phone]   HPI:   Chief Complaint: OBESITY [name] is here to discuss her progress with her obesity treatment plan. She is on the Category 3 plan and is following her eating plan approximately 100 % of the time. She states she is walking for 30 to 60 minutes 7 times per week. [name] continues to do well with weight loss on the category 3 plan but is getting bored with options, especially breakfast. Her weight is 230 lb (104.3 kg) today and has had a weight loss of 5 pounds over a period of 3 weeks since her last visit. She has lost 11 lbs since starting treatment with [place].  Vitamin D deficiency [name] has a diagnosis of vitamin D deficiency. She is currently stable on vit D and denies nausea, vomiting or muscle weakness.  Pre-Diabetes [name] has a diagnosis of pre-diabetes based on her elevated Hgb A1c and was informed this puts her at greater risk of developing diabetes. She is stable on metformin currently and continues to work on diet and exercise to decrease risk of diabetes. She denies nausea, vomiting or hypoglycemia.  Hypertension [name] is a 46 y.o. female with hypertension. She is currently on Lisinopril and HCTZ. Her blood pressure is improved from last visit.  Colwyn denies chest pain or headache. She is working weight loss to help control her blood pressure with the goal of decreasing her risk of heart attack and stroke. Kimberlys blood pressure is currently controlled.  Depression with emotional eating behaviors [name] is doing well decreasing emotional eating. She struggles with emotional eating and using food for comfort to the extent that it is negatively impacting her health. She often snacks when she is not hungry. [name] sometimes feels she is out of control and then feels guilty that she made poor food choices. She has been working on behavior modification techniques to help reduce her emotional  eating and has been somewhat successful. Her mood is stable and she shows no sign of suicidal or homicidal ideations.  Depression screen [name] 2/9 09/28/2016 07/29/2015 09/22/2014  Decreased Interest 2 0 0  Down, Depressed, Hopeless 1 0 0  PHQ - 2 Score 3 0 0  Altered sleeping 1 - -  Tired, decreased energy 2 - -  Change in appetite 1 - -  Feeling bad or failure about yourself  1 - -  Trouble concentrating 2 - -  Moving slowly or fidgety/restless 1 - -  Suicidal thoughts 0 - -  PHQ-9 Score 11 - -  Some recent data might be hidden      ALLERGIES: Allergies  Allergen Reactions  . Chloraprep One Step [Chlorhexidine Gluconate] Rash    Developed severe rash where chloraprep was used on chest area  . Ivp Dye [Iodinated Diagnostic Agents] Rash and Other (See Comments)    Flushing, minor facial rash & dyspnea  . Nalbuphine Shortness Of Breath and Rash    Nubain caused respiratory distress & rash  . Septra [Sulfamethoxazole-Trimethoprim] Shortness Of Breath and Rash  . Sulfamethoxazole-Trimethoprim Rash    rash  . Tylox [Oxycodone-Acetaminophen] Rash    MEDICATIONS: Current Outpatient Prescriptions on File Prior to Visit  Medication Sig Dispense Refill  . aspirin 81 MG tablet Take 81 mg by mouth daily.     [name] DOXEPIN HCL PO Take by mouth at bedtime.    . DULoxetine (CYMBALTA) 60 MG capsule Take 60 mg by mouth 3 (three) times daily.     [name]  Fluticasone-Salmeterol (ADVAIR DISKUS) 100-50 MCG/DOSE AEPB Inhale 1 puff into the lungs 2 (two) times daily. 60 each 5  . gabapentin (NEURONTIN) 100 MG capsule Take 2 capsules (200 mg total) by mouth at bedtime. (Patient taking differently: Take 100 mg by mouth at bedtime. ) 60 capsule 3  . hydrochlorothiazide (MICROZIDE) 12.5 MG capsule Take 1 capsule (12.5 mg total) by mouth daily. 90 capsule 2  . lisinopril (PRINIVIL,ZESTRIL) 40 MG tablet TAKE 1 TABLET EVERY DAY 90 tablet 2  . montelukast (SINGULAIR) 10 MG tablet Take 1 tablet (10 mg total) by mouth  at bedtime. 90 tablet 3  . polyethylene glycol powder (GLYCOLAX/MIRALAX) powder Take 17 g by mouth daily. 3350 g 0   No current facility-administered medications on file prior to visit.     PAST MEDICAL HISTORY: Past Medical History:  Diagnosis Date  . Anemia   . Anxiety   . Arthritis    inflammitory  . Asthma   . B12 deficiency   . Back pain   . Chronic joint pain   . Constipation   . DDD (degenerative disc disease), lumbar    low back and neck and shoulders  . Depression    during divorce & legal matters  . Fatty liver   . Fibromyalgia   . Gallbladder problem   . GERD (gastroesophageal reflux disease)   . History of kidney stones    Dr Phebe Colla  . History of polycystic ovarian disease S/P BSO  . Hx of anxiety disorder   . Hypertension    a  . Hypothyroidism    no meds  . IBS (irritable bowel syndrome)   . IBS (irritable bowel syndrome)   . Infertility, female   . Joint pain   . Kidney problem   . Lactose intolerance   . Lupus hx positive ANA   followed by Dr Gavin Pound; possible lupus  . Multiple food allergies   . Myalgia   . Osteoarthritis   . 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 POLYCYSTIC OVARIAN DISEASE 03/02/2007   Qualifier: Diagnosis of  By: Linna Darner MD, Rae Mar BSO for cysts & endometriosis; Dr Reino Kent, Gyn Seeing Dr Paula Compton    . PONV (postoperative nausea and vomiting)   . Prediabetes   . Sweating profusely   . Symptomatic mammary hypertrophy   . URI (upper respiratory infection)    currently on cefdinir    PAST SURGICAL HISTORY: Past Surgical History:  Procedure Laterality Date  . [place]   exploratory lap  . BREAST REDUCTION SURGERY Bilateral 03/26/2015   Procedure: BILATERAL BREAST REDUCTION ;  Surgeon: Wallace Going, DO;  Location: [facility];  Service: Plastics;  Laterality: Bilateral;  . CARPAL TUNNEL RELEASE     bilateral; Dr Sherwood Gambler  . COLONOSCOPY     in  1990s  . CYSTO/ RIGHT RETROGRADE URETERAL PYELOGRAM  07-09-2004   HX BILATERAL RENAL STONES/ RIGHT FLANK PAIN  . CYSTOSCOPY W/ URETERAL STENT PLACEMENT  11/12/2011   Procedure: CYSTOSCOPY WITH RETROGRADE PYELOGRAM/URETERAL STENT PLACEMENT;  Surgeon: Alexis Frock, MD;  Location: [facility];  Service: Urology;  Laterality: Right;  . CYSTOSCOPY W/ URETERAL STENT REMOVAL  12/07/2011   Procedure: CYSTOSCOPY WITH STENT REMOVAL;  Surgeon: Alexis Frock, MD;  Location[name];  Service: Urology;  Laterality: Right;  . CYSTOSCOPY/RETROGRADE/URETEROSCOPY/STONE EXTRACTION WITH BASKET  12/07/2011   Procedure: CYSTOSCOPY/RETROGRADE/URETEROSCOPY/STONE EXTRACTION WITH BASKET;  Surgeon: Alexis Frock,  MD;  Location: [facility];  Service: Urology;  Laterality: Right;  . DILATION AND CURETTAGE OF UTERUS     X5  . EXCISION LEFT BARTHOLIN GLAND  02-05-2002  . EYE SURGERY     Retinal tears surgery bilateral  . LAPAROSCOPIC ASSISTED VAGINAL HYSTERECTOMY  2000  . LAPAROSCOPIC CHOLECYSTECTOMY  05-31-2007  . LAPAROSCOPIC LASER ABLATION ENDOMETRIOSIS AND LEFT SALPINGO-OOPHORECTOMY  11-03-1999  . LAPAROSCOPY WITH RIGHT SALPINGO-OOPHECTOMY/ LYSIS ADHESIONS AND ABLATION ENDOMETRIOSIS  05-17-2001  . LIPOSUCTION Bilateral 03/26/2015   Procedure: WITH LIPOSUCTION;  Surgeon: Wallace Going, DO;  Location: [facility];  Service: Plastics;  Laterality: Bilateral;  . LUMBAR LAMINECTOMY  2003   L4 - L5  . PLANTAR FASCIA SURGERY     right  . PLANTAR FASCIA SURGERY  02/2011   left  . RIGHT URETEROSCOPIC STONE EXTRACTION  08-04-2000  . TONSILLECTOMY    . URETERAL STENT PLACEMENT  11/12/11   Dr Tresa Moore    SOCIAL HISTORY: Social History  Substance Use Topics  . Smoking status: Never Smoker  . Smokeless tobacco: Never Used  . Alcohol use 0.6 oz/week    1 Shots of liquor per week     Comment:  very rarely    FAMILY HISTORY: Family History  Problem Relation Age of Onset    . Hypertension Mother   . Colon polyps Mother   . Atrial fibrillation Mother        Dr Angelena Form  . Hyperlipidemia Mother   . Heart disease Mother   . Anxiety disorder Mother   . Sleep apnea Mother   . Obesity Mother   . Heart attack Father 40  . Diabetes Father   . Hypertension Father   . Hyperlipidemia Father   . Heart disease Father   . Stroke Father   . Obesity Father   . Hypertension Sister   . Heart attack Paternal Grandmother 5  . Breast cancer Paternal Aunt   . Colon cancer Maternal Uncle   . Colon cancer Paternal Uncle   . Colon polyps Maternal Grandmother   . Stroke Maternal Grandmother 76  . Diabetes Paternal Grandfather   . Stroke Maternal Grandfather 37    ROS: Review of Systems  Constitutional: Positive for weight loss.  Cardiovascular: Negative for chest pain.  Gastrointestinal: Negative for nausea and vomiting.  Musculoskeletal:       Negative muscle weakness  Neurological: Negative for headaches.  Endo/Heme/Allergies:       Negative hypoglycemia  Psychiatric/Behavioral: Positive for depression. Negative for suicidal ideas.    PHYSICAL EXAM: Blood pressure 113/74, pulse 85, temperature 98.7 F (37.1 C), temperature source Oral, height 5\' 8"  (1.727 m), weight 230 lb (104.3 kg), SpO2 98 %. Body mass index is 34.97 kg/m. Physical Exam  Constitutional: She is oriented to person, place, and time. [name].  Cardiovascular: Normal rate.   Pulmonary/Chest: Effort normal.  Musculoskeletal: Normal range of motion.  Neurological: She is oriented to person, place, and time.  Skin: Skin is warm and dry.  Psychiatric: She has a normal mood and affect. Her behavior is normal.  Vitals reviewed.   RECENT LABS AND TESTS: BMET    Component Value Date/Time   NA 141 09/28/2016 1043   K 4.4 09/28/2016 1043   CL 104 09/28/2016 1043   CO2 26 09/28/2016 1043   GLUCOSE 83 09/28/2016 1043   GLUCOSE 92 08/16/2016 1011   BUN  19 09/28/2016 1043   CREATININE 0.64 09/28/2016 1043  CALCIUM 9.0 09/28/2016 1043   GFRNONAA 107 09/28/2016 1043   GFRAA 124 09/28/2016 1043   Lab Results  Component Value Date   HGBA1C 5.6 09/28/2016   HGBA1C 5.9 08/16/2016   HGBA1C 5.9 06/04/2015   HGBA1C 5.6 09/22/2014   HGBA1C 5.8 04/29/2013   Lab Results  Component Value Date   INSULIN 14.7 09/28/2016   CBC    Component Value Date/Time   WBC 6.6 08/16/2016 1011   RBC 4.68 08/16/2016 1011   HGB 13.2 08/16/2016 1011   HCT 39.4 08/16/2016 1011   PLT 235.0 08/16/2016 1011   MCV 84.2 08/16/2016 1011   MCH 27.0 04/19/2015 0650   MCHC 33.4 08/16/2016 1011   RDW 13.9 08/16/2016 1011   LYMPHSABS 1.8 08/16/2016 1011   MONOABS 0.6 08/16/2016 1011   EOSABS 0.2 08/16/2016 1011   BASOSABS 0.0 08/16/2016 1011   Iron/TIBC/Ferritin/ %Sat No results found for: IRON, TIBC, FERRITIN, IRONPCTSAT Lipid Panel     Component Value Date/Time   CHOL 153 08/16/2016 1011   TRIG 61.0 08/16/2016 1011   HDL 55.40 08/16/2016 1011   CHOLHDL 3 08/16/2016 1011   VLDL 12.2 08/16/2016 1011   LDLCALC 85 08/16/2016 1011   Hepatic Function Panel     Component Value Date/Time   PROT 6.8 09/28/2016 1043   ALBUMIN 4.1 09/28/2016 1043   AST 30 09/28/2016 1043   ALT 39 (H) 09/28/2016 1043   ALKPHOS 115 09/28/2016 1043   BILITOT 0.4 09/28/2016 1043   BILIDIR 0.1 06/12/2013 1632   IBILI 0.2 06/11/2009 2309      Component Value Date/Time   TSH 1.54 08/16/2016 1011   TSH 2.40 06/04/2015 0857   TSH 2.45 02/16/2015 1706    ASSESSMENT AND PLAN: Other depression - with emotional eating - Plan: buPROPion (WELLBUTRIN SR) 200 MG 12 hr tablet  Essential hypertension  Prediabetes - Plan: metFORMIN (GLUCOPHAGE) 500 MG tablet  Vitamin D deficiency - Plan: Vitamin D, Ergocalciferol, (DRISDOL) 50000 units CAPS capsule  Class 2 severe obesity with serious comorbidity and body mass index (BMI) of 35.0 to 35.9 in adult, unspecified obesity type  ([place])  PLAN:  Vitamin D Deficiency Montzerrat was informed that low vitamin D levels contributes to fatigue and are associated with obesity, breast, and colon cancer. She agrees to continue to take prescription Vit D @50 ,000 IU every week #4 with no refills. We will check labs next month and will follow up for routine testing of vitamin D, at least 2-3 times per year. She was informed of the risk of over-replacement of vitamin D and agrees to not increase her dose unless he discusses this with [place] first. Chontel agrees to follow up with our clinic in 4 weeks.  Pre-Diabetes Anner will continue to work on weight loss, exercise, and decreasing simple carbohydrates in her diet to help decrease the risk of diabetes. We dicussed metformin including benefits and risks. She was informed that eating too many simple carbohydrates or too many calories at one sitting increases the likelihood of GI side effects. Halee requested metformin for now and a prescription was written today for 1 month refill. We will check labs next month and Markie agreed to follow up with [place] as directed to monitor her progress.  Hypertension We discussed sodium restriction, working on healthy weight loss, and a regular exercise program as the means to achieve improved blood pressure control. Makalia agreed with this plan and agreed to follow up as directed. We will continue to monitor her blood  pressure as well as her progress with the above lifestyle modifications. She will continue her medications as prescribed and will watch for signs of hypotension as she continues her lifestyle modifications.  Depression with Emotional Eating Behaviors We discussed behavior modification techniques today to help Lilyanah deal with her emotional eating and depression. She has agreed to continue Wellbutrin SR 200 mg qd #30 with no refills and will follow up as directed.  Obesity Marjory is currently in the action stage of change. As such,  her goal is to continue with weight loss efforts She has agreed to keep a food journal with 300 to 400 calories and 20+ grams of protein at breakfast daily and follow the Category 3 plan [place] has been instructed to work up to a goal of 150 minutes of combined cardio and strengthening exercise per week for weight loss and overall health benefits. We discussed the following Behavioral Modification Strategies today: increasing lean protein intake, decreasing simple carbohydrates , work on meal planning and easy cooking plans and emotional eating strategies  Jon has agreed to follow up with our clinic in 4 weeks and with our registered dietician in 2 weeks. She was informed of the importance of frequent follow up visits to maximize her success with intensive lifestyle modifications for her multiple health conditions.  I, Doreene Nest, am acting as transcriptionist for Dennard Nip, MD  I have reviewed the above documentation for accuracy and completeness, and I agree with the above. -Dennard Nip, MD   OBESITY BEHAVIORAL INTERVENTION VISIT  Today's visit was # 7 out of 22.  Starting weight: 241 lbs Starting date: 09/28/16 Today's weight : 230 lbs Today's date: 01/11/2017 Total lbs lost to date: 230 lbs (Patients must lose 7 lbs in the first 6 months to continue with counseling)   ASK: We discussed the diagnosis of obesity with Ames today and Raechel agreed to give [place] permission to discuss obesity behavioral modification therapy today.  ASSESS: Vernida has the diagnosis of obesity and her BMI today is 34.98 Patrcia is in the action stage of change   ADVISE: Roshan was educated on the multiple health risks of obesity as well as the benefit of weight loss to improve her health. She was advised of the need for long term treatment and the importance of lifestyle modifications.  AGREE: Multiple dietary modification options and treatment options were  discussed and  Karalee agreed to keep a food journal with 300 to 400 calories and 20+ grams of protein at breakfast daily and follow the Category 3 plan We discussed the following Behavioral Modification Strategies today: increasing lean protein intake, decreasing simple carbohydrates , work on meal planning and easy cooking plans and emotional eating strategies

## 2017-01-12 NOTE — Telephone Encounter (Signed)
Spoke to [place] at [facility] and the bupropion and metformin were recd but due to insr cannot be filled until 01/16/17.  Pt notified of above and will check with pharm on that day and ask for rx's that are on hold.   Pt gave verbal understanding.   [name] R CMA

## 2017-01-12 NOTE — Telephone Encounter (Signed)
Pt called in stating the pharmacy does not have buPROPion (WELLBUTRIN SR) 200 MG 12 hr tablet , metformin. She says the vit d rx was received at her pharmacy just not the rest.  [facility] [place] phone: [phone]

## 2017-01-25 ENCOUNTER — Ambulatory Visit (INDEPENDENT_AMBULATORY_CARE_PROVIDER_SITE_OTHER): Payer: PPO | Admitting: Dietician

## 2017-01-25 VITALS — BP 132/82 | HR 81 | Temp 98.3°F | Ht 68.0 in | Wt 231.0 lb

## 2017-01-25 DIAGNOSIS — R7303 Prediabetes: Secondary | ICD-10-CM | POA: Diagnosis not present

## 2017-01-25 DIAGNOSIS — Z6835 Body mass index (BMI) 35.0-35.9, adult: Secondary | ICD-10-CM

## 2017-01-25 DIAGNOSIS — Z9189 Other specified personal risk factors, not elsewhere classified: Secondary | ICD-10-CM | POA: Diagnosis not present

## 2017-01-25 NOTE — Progress Notes (Signed)
  Office: [phone]  /  Fax: [phone]     [name] has a diagnosis of prediabetes based on her elevated HgA1c and was informed this puts her at greater risk of developing diabetes. She is here today for diabetes prevention nutrition counseling.   [name] continues to follow the category 3 meal plan ~80% of the time and is journaling food intake at breakfast ~100% of the time, 300-400 calories and 20+ grams of protein. She is doing  well meeting her nutrition goals at breakfast but is deviating from the category 3 meal plan at lunch and dinner.  Her weight today is 231 lbs a weight increase of 1 lb since her last visit however she is retaining some fluid. She has had a total of 10 lbs since starting treatment with [place]. Her BMI is 35.13.  Patient was educated about food nutrients ie protein, fats, simple and complex carbohydrates and how these affect insulin response. Focus on portion control,  avoiding simple carbohydrates and lower fat foods for ongoing wt loss efforts. She was given journaling instructions for her daily food intake of 1400-1600 calories and 90 grams of protein.   [name] is on the following meal plan category 3 with journaling at breakfast 300-400 calories 20+ grams of protein.  Her meal plan was individualized for maximum benefit.  Also discussed at length the following behavioral modifications to help maximize  Success,  keeping a strict food journal.   [name] has been instructed to work up to a goal of 150 minutes of combined cardio and strengthening exercise per week for weight loss and overall health benefits. Written information was provided and the following handouts were given:   journaling instructions, healthy recipes.

## 2017-02-06 ENCOUNTER — Ambulatory Visit (INDEPENDENT_AMBULATORY_CARE_PROVIDER_SITE_OTHER): Payer: PPO | Admitting: Family Medicine

## 2017-02-06 VITALS — BP 111/75 | HR 74 | Temp 98.2°F | Ht 68.0 in | Wt 231.0 lb

## 2017-02-06 DIAGNOSIS — F3289 Other specified depressive episodes: Secondary | ICD-10-CM

## 2017-02-06 DIAGNOSIS — R7303 Prediabetes: Secondary | ICD-10-CM

## 2017-02-06 DIAGNOSIS — E7849 Other hyperlipidemia: Secondary | ICD-10-CM | POA: Diagnosis not present

## 2017-02-06 DIAGNOSIS — E559 Vitamin D deficiency, unspecified: Secondary | ICD-10-CM | POA: Diagnosis not present

## 2017-02-06 DIAGNOSIS — Z6835 Body mass index (BMI) 35.0-35.9, adult: Secondary | ICD-10-CM

## 2017-02-06 MED ORDER — VITAMIN D (ERGOCALCIFEROL) 1.25 MG (50000 UNIT) PO CAPS: 50000.0000 [IU] | ORAL_CAPSULE | ORAL | 0 refills | Status: DC

## 2017-02-06 MED ORDER — METFORMIN HCL 500 MG PO TABS: 500.0000 mg | ORAL_TABLET | Freq: Two times a day (BID) | ORAL | 0 refills | Status: DC

## 2017-02-06 MED ORDER — BUPROPION HCL ER (SR) 200 MG PO TB12: 200.0000 mg | ORAL_TABLET | Freq: Every day | ORAL | 0 refills | Status: DC

## 2017-02-06 NOTE — Progress Notes (Signed)
Office: [phone]  /  Fax: [phone]   HPI:   Chief Complaint: OBESITY [name] is here to discuss her progress with her obesity treatment plan. She is on the  keep a food journal with 300-400 calories and 20+ grams of protein at breakfast daily and follow the Category 3 plan and is following her eating plan approximately 75 % of the time. She states she is doing stairs and walking 20,000 steps over a whole day. [name] has done well maintaining her weight. She is journaling approximately 50% of the time and she is doing better increasing her protein but not as many vegetables. She is frustrated that she hasn't lost any weight.  Her weight is 231 lb (104.8 kg) today and has gained 1 pound since her last visit. She has lost 10 lbs since starting treatment with [place].  Pre-Diabetes [name] has a diagnosis of pre-diabetes based on her elevated Hgb A1c and was informed this puts her at greater risk of developing diabetes. She is taking metformin currently and continues to work on diet and exercise to decrease risk of diabetes. She denies nausea or hypoglycemia.  Hyperlipidemia [name] has hyperlipidemia and she is attempting to diet control her cholesterol levels with intensive lifestyle modification including a low saturated fat diet, exercise and weight loss. She is not on statin and denies any chest pain, claudication or myalgias.  Vitamin D deficiency [name] has a diagnosis of vitamin D deficiency. She stable on prescription Vit D, she still notes fatigue and she is due for labs. She denies nausea, vomiting or muscle weakness.  Depression with emotional eating behaviors [name] is struggling with emotional eating and using food for comfort to the extent that it is negatively impacting her health. She often snacks when she is not hungry. [name] sometimes feels she is out of control and then feels guilty that she made poor food choices. [name] is doing well on Wellbutrin, her blood  pressure is good and she has decreased her emotional eating overall and has been somewhat successful. She shows no sign of suicidal or homicidal ideations.  Depression screen [name] 2/9 09/28/2016 07/29/2015 09/22/2014  Decreased Interest 2 0 0  Down, Depressed, Hopeless 1 0 0  PHQ - 2 Score 3 0 0  Altered sleeping 1 - -  Tired, decreased energy 2 - -  Change in appetite 1 - -  Feeling bad or failure about yourself  1 - -  Trouble concentrating 2 - -  Moving slowly or fidgety/restless 1 - -  Suicidal thoughts 0 - -  PHQ-9 Score 11 - -  Some recent data might be hidden   ALLERGIES: Allergies  Allergen Reactions  . Chloraprep One Step [Chlorhexidine Gluconate] Rash    Developed severe rash where chloraprep was used on chest area  . Ivp Dye [Iodinated Diagnostic Agents] Rash and Other (See Comments)    Flushing, minor facial rash & dyspnea  . Nalbuphine Shortness Of Breath and Rash    Nubain caused respiratory distress & rash  . Septra [Sulfamethoxazole-Trimethoprim] Shortness Of Breath and Rash  . Sulfamethoxazole-Trimethoprim Rash    rash  . Tylox [Oxycodone-Acetaminophen] Rash    MEDICATIONS: Current Outpatient Medications on File Prior to Visit  Medication Sig Dispense Refill  . aspirin 81 MG tablet Take 81 mg by mouth daily.     [name] DOXEPIN HCL PO Take by mouth at bedtime.    . DULoxetine (CYMBALTA) 60 MG capsule Take 60 mg by mouth 3 (three) times daily.     [name]  Fluticasone-Salmeterol (ADVAIR DISKUS) 100-50 MCG/DOSE AEPB Inhale 1 puff into the lungs 2 (two) times daily. 60 each 5  . gabapentin (NEURONTIN) 100 MG capsule Take 2 capsules (200 mg total) by mouth at bedtime. (Patient taking differently: Take 100 mg by mouth at bedtime. ) 60 capsule 3  . hydrochlorothiazide (MICROZIDE) 12.5 MG capsule Take 1 capsule (12.5 mg total) by mouth daily. 90 capsule 2  . lisinopril (PRINIVIL,ZESTRIL) 40 MG tablet TAKE 1 TABLET EVERY DAY 90 tablet 2  . montelukast (SINGULAIR) 10 MG tablet Take 1  tablet (10 mg total) by mouth at bedtime. 90 tablet 3  . polyethylene glycol powder (GLYCOLAX/MIRALAX) powder Take 17 g by mouth daily. 3350 g 0   No current facility-administered medications on file prior to visit.     PAST MEDICAL HISTORY: Past Medical History:  Diagnosis Date  . Anemia   . Anxiety   . Arthritis    inflammitory  . Asthma   . B12 deficiency   . Back pain   . Chronic joint pain   . Constipation   . DDD (degenerative disc disease), lumbar    low back and neck and shoulders  . Depression    during divorce & legal matters  . Fatty liver   . Fibromyalgia   . Gallbladder problem   . GERD (gastroesophageal reflux disease)   . History of kidney stones    Dr Phebe Colla  . History of polycystic ovarian disease S/P BSO  . Hx of anxiety disorder   . Hypertension    a  . Hypothyroidism    no meds  . IBS (irritable bowel syndrome)   . IBS (irritable bowel syndrome)   . Infertility, female   . Joint pain   . Kidney problem   . Lactose intolerance   . Lupus hx positive ANA   followed by Dr Gavin Pound; possible lupus  . Multiple food allergies   . Myalgia   . Osteoarthritis   . 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 POLYCYSTIC OVARIAN DISEASE 03/02/2007   Qualifier: Diagnosis of  By: Linna Darner MD, Rae Mar BSO for cysts & endometriosis; Dr Reino [name], Gyn Seeing Dr Paula Compton    . PONV (postoperative nausea and vomiting)   . Prediabetes   . Sweating profusely   . Symptomatic mammary hypertrophy   . URI (upper respiratory infection)    currently on cefdinir    PAST SURGICAL HISTORY: Past Surgical History:  Procedure Laterality Date  . [place]   exploratory lap  . CARPAL TUNNEL RELEASE     bilateral; Dr Sherwood Gambler  . COLONOSCOPY     in 1990s  . CYSTO/ RIGHT RETROGRADE URETERAL PYELOGRAM  07-09-2004   HX BILATERAL RENAL STONES/ RIGHT FLANK PAIN  . DILATION AND CURETTAGE OF UTERUS     X5  . EXCISION LEFT  BARTHOLIN GLAND  02-05-2002  . EYE SURGERY     Retinal tears surgery bilateral  . LAPAROSCOPIC ASSISTED VAGINAL HYSTERECTOMY  2000  . LAPAROSCOPIC CHOLECYSTECTOMY  05-31-2007  . LAPAROSCOPIC LASER ABLATION ENDOMETRIOSIS AND LEFT SALPINGO-OOPHORECTOMY  11-03-1999  . LAPAROSCOPY WITH RIGHT SALPINGO-OOPHECTOMY/ LYSIS ADHESIONS AND ABLATION ENDOMETRIOSIS  05-17-2001  . LUMBAR LAMINECTOMY  2003   L4 - L5  . PLANTAR FASCIA SURGERY     right  . PLANTAR FASCIA SURGERY  02/2011   left  . RIGHT URETEROSCOPIC STONE EXTRACTION  08-04-2000  . TONSILLECTOMY    . URETERAL  STENT PLACEMENT  11/12/11   Dr Tresa Moore    SOCIAL HISTORY: Social History   Tobacco Use  . Smoking status: Never Smoker  . Smokeless tobacco: Never Used  Substance Use Topics  . Alcohol use: Yes    Alcohol/week: 0.6 oz    Types: 1 Shots of liquor per week    Comment:  very rarely  . Drug use: No    FAMILY HISTORY: Family History  Problem Relation Age of Onset  . Hypertension Mother   . Colon polyps Mother   . Atrial fibrillation Mother        Dr Angelena Form  . Hyperlipidemia Mother   . Heart disease Mother   . Anxiety disorder Mother   . Sleep apnea Mother   . Obesity Mother   . Heart attack Father 66  . Diabetes Father   . Hypertension Father   . Hyperlipidemia Father   . Heart disease Father   . Stroke Father   . Obesity Father   . Hypertension Sister   . Heart attack Paternal Grandmother 18  . Breast cancer Paternal Aunt   . Colon cancer Maternal Uncle   . Colon cancer Paternal Uncle   . Colon polyps Maternal Grandmother   . Stroke Maternal Grandmother 76  . Diabetes Paternal Grandfather   . Stroke Maternal Grandfather 37    ROS: Review of Systems  Constitutional: Positive for malaise/fatigue. Negative for weight loss.  Cardiovascular: Negative for chest pain and claudication.  Gastrointestinal: Negative for nausea and vomiting.  Musculoskeletal: Negative for myalgias.       Negative muscle weakness   Endo/Heme/Allergies:       Negative hypoglycemia  Psychiatric/Behavioral: Positive for depression. Negative for suicidal ideas.    PHYSICAL EXAM: Blood pressure 111/75, pulse 74, temperature 98.2 F (36.8 C), temperature source Oral, height 5\' 8"  (1.727 m), weight 231 lb (104.8 kg), SpO2 93 %. Body mass index is 35.12 kg/m. Physical Exam  Constitutional: She is oriented to person, place, and time. [name].  Cardiovascular: Normal rate.  Pulmonary/Chest: Effort normal.  Musculoskeletal: Normal range of motion.  Neurological: She is oriented to person, place, and time.  Skin: Skin is warm and dry.  Psychiatric: She has a normal mood and affect.  Vitals reviewed.   RECENT LABS AND TESTS: BMET    Component Value Date/Time   NA 141 09/28/2016 1043   K 4.4 09/28/2016 1043   CL 104 09/28/2016 1043   CO2 26 09/28/2016 1043   GLUCOSE 83 09/28/2016 1043   GLUCOSE 92 08/16/2016 1011   BUN 19 09/28/2016 1043   CREATININE 0.64 09/28/2016 1043   CALCIUM 9.0 09/28/2016 1043   GFRNONAA 107 09/28/2016 1043   GFRAA 124 09/28/2016 1043   Lab Results  Component Value Date   HGBA1C 5.6 09/28/2016   HGBA1C 5.9 08/16/2016   HGBA1C 5.9 06/04/2015   HGBA1C 5.6 09/22/2014   HGBA1C 5.8 04/29/2013   Lab Results  Component Value Date   INSULIN 14.7 09/28/2016   CBC    Component Value Date/Time   WBC 6.6 08/16/2016 1011   RBC 4.68 08/16/2016 1011   HGB 13.2 08/16/2016 1011   HCT 39.4 08/16/2016 1011   PLT 235.0 08/16/2016 1011   MCV 84.2 08/16/2016 1011   MCH 27.0 04/19/2015 0650   MCHC 33.4 08/16/2016 1011   RDW 13.9 08/16/2016 1011   LYMPHSABS 1.8 08/16/2016 1011   MONOABS 0.6 08/16/2016 1011   EOSABS 0.2 08/16/2016 1011   BASOSABS  0.0 08/16/2016 1011   Iron/TIBC/Ferritin/ %Sat No results found for: IRON, TIBC, FERRITIN, IRONPCTSAT Lipid Panel     Component Value Date/Time   CHOL 153 08/16/2016 1011   TRIG 61.0 08/16/2016 1011   HDL  55.40 08/16/2016 1011   CHOLHDL 3 08/16/2016 1011   VLDL 12.2 08/16/2016 1011   LDLCALC 85 08/16/2016 1011   Hepatic Function Panel     Component Value Date/Time   PROT 6.8 09/28/2016 1043   ALBUMIN 4.1 09/28/2016 1043   AST 30 09/28/2016 1043   ALT 39 (H) 09/28/2016 1043   ALKPHOS 115 09/28/2016 1043   BILITOT 0.4 09/28/2016 1043   BILIDIR 0.1 06/12/2013 1632   IBILI 0.2 06/11/2009 2309      Component Value Date/Time   TSH 1.54 08/16/2016 1011   TSH 2.40 06/04/2015 0857   TSH 2.45 02/16/2015 1706    ASSESSMENT AND PLAN: Prediabetes - Plan: Comprehensive metabolic panel, Hemoglobin A1c, Insulin, random, metFORMIN (GLUCOPHAGE) 500 MG tablet  Other hyperlipidemia - Plan: Lipid Panel With LDL/HDL Ratio  Vitamin D deficiency - Plan: VITAMIN D 25 Hydroxy (Vit-D Deficiency, Fractures), Vitamin D, Ergocalciferol, (DRISDOL) 50000 units CAPS capsule  Other depression - with emotional eating - Plan: buPROPion (WELLBUTRIN SR) 200 MG 12 hr tablet  Class 2 severe obesity with serious comorbidity and body mass index (BMI) of 35.0 to 35.9 in adult, unspecified obesity type ([place])  PLAN:  Pre-Diabetes [name] will continue to work on weight loss, exercise, and decreasing simple carbohydrates in her diet to help decrease the risk of diabetes. We dicussed metformin including benefits and risks. She was informed that eating too many simple carbohydrates or too many calories at one sitting increases the likelihood of GI side effects. [name] agrees to continue taking metformin and we will refill for 1 month. We will recheck labs and [name] agrees to follow up with our clinic in 2 weeks as directed to monitor her progress.  Hyperlipidemia [name] was informed of the American Heart Association Guidelines emphasizing intensive lifestyle modifications as the first line treatment for hyperlipidemia. We discussed many lifestyle modifications today in depth, and Vy will continue to work on  decreasing saturated fats such as fatty red meat, butter and many fried foods. She will also increase vegetables and lean protein in her diet and continue to work on diet, exercise, and weight loss efforts. We will recheck labs and [name] agrees to follow up with our clinic in 2 weeks.  Vitamin D Deficiency [name] was informed that low vitamin D levels contributes to fatigue and are associated with obesity, breast, and colon cancer. [name] agrees to continue taking prescription Vit D @50 ,000 IU every week #4 and we will refill for 1 month. She will follow up for routine testing of vitamin D, at least 2-3 times per year. She was informed of the risk of over-replacement of vitamin D and agrees to not increase her dose unless he discusses this with [place] first. We will recheck labs and [name] agrees to follow up with our clinic in 2 weeks.  Depression with Emotional Eating Behaviors We discussed behavior modification techniques today to help [name] deal with her emotional eating and depression. [name] agrees to continue taking  Wellbutrin SR 200 mg qd and we will refill for 1 month. [name] agrees to follow up with our clinic in 2 weeks.  Obesity [name] is currently in the action stage of change. As such, her goal is to continue with weight loss efforts She has agreed to change  to follow the Category 3 plan + breakfast options [name] has been instructed to work up to a goal of 150 minutes of combined cardio and strengthening exercise per week for weight loss and overall health benefits. We discussed the following Behavioral Modification Strategies today: increasing lean protein intake, decreasing simple carbohydrates, work on meal planning and easy cooking plans, ways to avoid boredom eating, ways to avoid night time snacking, and keeping healthy foods in the home.   [name] has agreed to follow up with our clinic in 2 weeks. She was informed of the importance of frequent follow up visits  to maximize her success with intensive lifestyle modifications for her multiple health conditions.  I, Trixie Dredge, am acting as transcriptionist for Dennard Nip, MD  I have reviewed the above documentation for accuracy and completeness, and I agree with the above. -Dennard Nip, MD      Today's visit was # 8 out of 22.  Starting weight: 241 lbs Starting date: 09/28/16 Today's weight : 231 lbs  Today's date: 02/06/2017 Total lbs lost to date: 10 (Patients must lose 7 lbs in the first 6 months to continue with counseling)   ASK: We discussed the diagnosis of obesity with [name] today and [name] agreed to give [place] permission to discuss obesity behavioral modification therapy today.  ASSESS: [name] has the diagnosis of obesity and her BMI today is 35.13 [name] is in the action stage of change   ADVISE: [name] was educated on the multiple health risks of obesity as well as the benefit of weight loss to improve her health. She was advised of the need for long term treatment and the importance of lifestyle modifications.  AGREE: Multiple dietary modification options and treatment options were discussed and  [name] agreed to follow the Category 3 plan + breakfast options We discussed the following Behavioral Modification Strategies today: increasing lean protein intake, decreasing simple carbohydrates, work on meal planning and easy cooking plans, ways to avoid boredom eating, ways to avoid night time snacking, and keeping healthy foods in the home.

## 2017-02-07 LAB — HEMOGLOBIN A1C
Est. average glucose Bld gHb Est-mCnc: 114 mg/dL
HEMOGLOBIN A1C: 5.6 % (ref 4.8–5.6)

## 2017-02-07 LAB — COMPREHENSIVE METABOLIC PANEL
A/G RATIO: 1.5 (ref 1.2–2.2)
ALK PHOS: 94 IU/L (ref 39–117)
ALT: 23 IU/L (ref 0–32)
AST: 20 IU/L (ref 0–40)
Albumin: 4.1 g/dL (ref 3.5–5.5)
BILIRUBIN TOTAL: 0.2 mg/dL (ref 0.0–1.2)
BUN / CREAT RATIO: 21 (ref 9–23)
BUN: 16 mg/dL (ref 6–24)
CO2: 24 mmol/L (ref 20–29)
CREATININE: 0.76 mg/dL (ref 0.57–1.00)
Calcium: 9.5 mg/dL (ref 8.7–10.2)
Chloride: 105 mmol/L (ref 96–106)
GFR calc Af Amer: 108 mL/min/{1.73_m2} (ref >59)
GFR calc non Af Amer: 94 mL/min/{1.73_m2} (ref >59)
GLUCOSE: 91 mg/dL (ref 65–99)
Globulin, Total: 2.7 g/dL (ref 1.5–4.5)
Potassium: 4.6 mmol/L (ref 3.5–5.2)
Sodium: 143 mmol/L (ref 134–144)
Total Protein: 6.8 g/dL (ref 6.0–8.5)

## 2017-02-07 LAB — LIPID PANEL WITH LDL/HDL RATIO
CHOLESTEROL TOTAL: 153 mg/dL (ref 100–199)
HDL: 51 mg/dL (ref >39)
LDL CALC: 86 mg/dL (ref 0–99)
LDl/HDL Ratio: 1.7 ratio (ref 0.0–3.2)
TRIGLYCERIDES: 78 mg/dL (ref 0–149)
VLDL Cholesterol Cal: 16 mg/dL (ref 5–40)

## 2017-02-07 LAB — VITAMIN D 25 HYDROXY (VIT D DEFICIENCY, FRACTURES): Vit D, 25-Hydroxy: 43.7 ng/mL (ref 30.0–100.0)

## 2017-02-07 LAB — INSULIN, RANDOM: INSULIN: 28.1 u[IU]/mL — ABNORMAL HIGH (ref 2.6–24.9)

## 2017-02-15 DIAGNOSIS — M5412 Radiculopathy, cervical region: Secondary | ICD-10-CM | POA: Diagnosis not present

## 2017-02-16 ENCOUNTER — Other Ambulatory Visit: Payer: PPO

## 2017-02-16 ENCOUNTER — Encounter: Payer: Self-pay | Admitting: Internal Medicine

## 2017-02-16 ENCOUNTER — Ambulatory Visit: Payer: PPO | Admitting: Internal Medicine

## 2017-02-16 VITALS — BP 122/80 | HR 82 | Temp 99.6°F | Resp 16 | Wt 234.0 lb

## 2017-02-16 DIAGNOSIS — E039 Hypothyroidism, unspecified: Secondary | ICD-10-CM | POA: Diagnosis not present

## 2017-02-16 DIAGNOSIS — R7303 Prediabetes: Secondary | ICD-10-CM

## 2017-02-16 DIAGNOSIS — R319 Hematuria, unspecified: Secondary | ICD-10-CM | POA: Diagnosis not present

## 2017-02-16 DIAGNOSIS — I10 Essential (primary) hypertension: Secondary | ICD-10-CM | POA: Diagnosis not present

## 2017-02-16 DIAGNOSIS — R109 Unspecified abdominal pain: Secondary | ICD-10-CM

## 2017-02-16 LAB — POCT URINALYSIS DIPSTICK
Bilirubin, UA: NEGATIVE
Blood, UA: 10
Glucose, UA: NEGATIVE
KETONES UA: NEGATIVE
LEUKOCYTES UA: NEGATIVE
NITRITE UA: NEGATIVE
PROTEIN UA: NEGATIVE
Spec Grav, UA: 1.02 (ref 1.010–1.025)
Urobilinogen, UA: 0.2 E.U./dL
pH, UA: 6 (ref 5.0–8.0)

## 2017-02-16 MED ORDER — HYDROCODONE-ACETAMINOPHEN 5-325 MG PO TABS: 1.0000 | ORAL_TABLET | Freq: Four times a day (QID) | ORAL | 0 refills | Status: DC | PRN

## 2017-02-16 NOTE — Assessment & Plan Note (Signed)
BP well controlled Current regimen effective and well tolerated Continue current medications at current doses cmp  

## 2017-02-16 NOTE — Assessment & Plan Note (Signed)
Urine dip not suggestive of urinary tract infection Her symptoms are consistent with a renal stone We will evaluate further with a CT scan-renal stone protocol We will send urine for culture in case there is a UTI, but will hold off on antibiotics Vicodin as needed for pain She does have an appointment with urology next month

## 2017-02-16 NOTE — Progress Notes (Signed)
Subjective:    Patient ID: [name], female    DOB: 02/06/1971, 46 y.o.   MRN: [id number]  HPI The patient is here for follow up.  She is following at the weight management clinic and is happy with her weight loss and changes she has made in her lifestyle.   Hypertension: She is taking her medication daily. She is compliant with a low sodium diet.  She denies chest pain, edema, shortness of breath and regular headaches. She is exercising regularly - walking  .      Hypothyroidism:  She is taking her medication daily.  She denies any recent changes in energy or weight that are unexplained.   Prediabetes:  She is compliant with a low sugar/carbohydrate diet.  She is exercising regularly.  Urinary symptoms/abdominal pain:  She has had urinary symptoms for several days and has had a history of several kidney stones in the past.  For about one month she has had urinary urgency. At times she will experience incontinence.  For several days she has had back and lower abdominal discomfort.  She has had increased frequency, dysuria and has hematuria.  She had sharp pain the other day in the urethra that was transient.  She feels her symptoms are consistent with prior kidney stones.  She did make an appointment with neurology, but was not able to see them until next month.    Medications and allergies reviewed with patient and updated if appropriate.  Patient Active Problem List   Diagnosis Date Noted  . Other hyperlipidemia 02/06/2017  . Class 2 severe obesity with serious comorbidity and body mass index (BMI) of 35.0 to 35.9 in adult ([place]) 02/06/2017  . Asthma exacerbation, mild 11/24/2016  . Vitamin D deficiency 11/09/2016  . Class 2 obesity with serious comorbidity and body mass index (BMI) of 35.0 to 35.9 in adult 11/09/2016  . Right shoulder pain 10/11/2016  . AC (acromioclavicular) joint arthritis 09/13/2016  . Postsurgical menopause 08/16/2016  . Mucoid cyst of joint  11/04/2015  . External hemorrhoid 08/20/2015  . Prediabetes 06/05/2015  . Status post bilateral breast reduction 04/01/2015  . Cervical disc disorder with radiculopathy of cervical region 05/23/2014  . Neck pain 02/03/2014  . Ulnar neuropathy 01/16/2014  . Fibromyalgia 01/16/2014  . Recurrent nephrolithiasis 04/29/2013  . Arthralgia 04/29/2013  . Panic attacks 01/15/2013  . IBS (irritable bowel syndrome) 12/14/2011  . Premature menopause on hormone replacement therapy 09/02/2011  . B12 deficiency 06/30/2009  . Hypothyroidism 03/02/2007  . Essential hypertension 03/02/2007    Current Outpatient Medications on File Prior to Visit  Medication Sig Dispense Refill  . aspirin 81 MG tablet Take 81 mg by mouth daily.     [name] buPROPion (WELLBUTRIN SR) 200 MG 12 hr tablet Take 1 tablet (200 mg total) daily by mouth. 30 tablet 0  . DOXEPIN HCL PO Take by mouth at bedtime.    . DULoxetine (CYMBALTA) 60 MG capsule Take 60 mg by mouth 3 (three) times daily.     . Fluticasone-Salmeterol (ADVAIR DISKUS) 100-50 MCG/DOSE AEPB Inhale 1 puff into the lungs 2 (two) times daily. 60 each 5  . gabapentin (NEURONTIN) 100 MG capsule Take 2 capsules (200 mg total) by mouth at bedtime. (Patient taking differently: Take 100 mg by mouth at bedtime. ) 60 capsule 3  . hydrochlorothiazide (MICROZIDE) 12.5 MG capsule Take 1 capsule (12.5 mg total) by mouth daily. 90 capsule 2  . lisinopril (PRINIVIL,ZESTRIL) 40 MG tablet TAKE 1  TABLET EVERY DAY 90 tablet 2  . metFORMIN (GLUCOPHAGE) 500 MG tablet Take 1 tablet (500 mg total) 2 (two) times daily by mouth. 60 tablet 0  . montelukast (SINGULAIR) 10 MG tablet Take 1 tablet (10 mg total) by mouth at bedtime. 90 tablet 3  . polyethylene glycol powder (GLYCOLAX/MIRALAX) powder Take 17 g by mouth daily. 3350 g 0  . Vitamin D, Ergocalciferol, (DRISDOL) 50000 units CAPS capsule Take 1 capsule (50,000 Units total) every 7 (seven) days by mouth. 4 capsule 0   No current  facility-administered medications on file prior to visit.     Past Medical History:  Diagnosis Date  . Anemia   . Anxiety   . Arthritis    inflammitory  . Asthma   . B12 deficiency   . Back pain   . Chronic joint pain   . Constipation   . DDD (degenerative disc disease), lumbar    low back and neck and shoulders  . Depression    during divorce & legal matters  . Fatty liver   . Fibromyalgia   . Gallbladder problem   . GERD (gastroesophageal reflux disease)   . History of kidney stones    Dr Phebe Colla  . History of polycystic ovarian disease S/P BSO  . Hx of anxiety disorder   . Hypertension    a  . Hypothyroidism    no meds  . IBS (irritable bowel syndrome)   . IBS (irritable bowel syndrome)   . Infertility, female   . Joint pain   . Kidney problem   . Lactose intolerance   . Lupus hx positive ANA   followed by Dr Gavin Pound; possible lupus  . Multiple food allergies   . Myalgia   . Osteoarthritis   . 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 POLYCYSTIC OVARIAN DISEASE 03/02/2007   Qualifier: Diagnosis of  By: Linna Darner MD, Rae Mar BSO for cysts & endometriosis; Dr Reino Kent, Gyn Seeing Dr Paula Compton    . PONV (postoperative nausea and vomiting)   . Prediabetes   . Sweating profusely   . Symptomatic mammary hypertrophy   . URI (upper respiratory infection)    currently on cefdinir    Past Surgical History:  Procedure Laterality Date  . [place]za   exploratory lap  . BREAST REDUCTION SURGERY Bilateral 03/26/2015   Procedure: BILATERAL BREAST REDUCTION ;  Surgeon: Wallace Going, DO;  Location: [place];  Service: Plastics;  Laterality: Bilateral;  . CARPAL TUNNEL RELEASE     bilateral; Dr Sherwood Gambler  . COLONOSCOPY     in 1990s  . CYSTO/ RIGHT RETROGRADE URETERAL PYELOGRAM  07-09-2004   HX BILATERAL RENAL STONES/ RIGHT FLANK PAIN  . CYSTOSCOPY W/ URETERAL STENT PLACEMENT  11/12/2011   Procedure:  CYSTOSCOPY WITH RETROGRADE PYELOGRAM/URETERAL STENT PLACEMENT;  Surgeon: Alexis Frock, MD;  Location: [facility];  Service: Urology;  Laterality: Right;  . CYSTOSCOPY W/ URETERAL STENT REMOVAL  12/07/2011   Procedure: CYSTOSCOPY WITH STENT REMOVAL;  Surgeon: Alexis Frock, MD;  Location: [facility];  Service: Urology;  Laterality: Right;  . CYSTOSCOPY/RETROGRADE/URETEROSCOPY/STONE EXTRACTION WITH BASKET  12/07/2011   Procedure: CYSTOSCOPY/RETROGRADE/URETEROSCOPY/STONE EXTRACTION WITH BASKET;  Surgeon: Alexis Frock, MD;  Location: [facility];  Service: Urology;  Laterality: Right;  . DILATION AND CURETTAGE OF UTERUS     X5  . EXCISION LEFT BARTHOLIN GLAND  02-05-2002  . EYE SURGERY  Retinal tears surgery bilateral  . LAPAROSCOPIC ASSISTED VAGINAL HYSTERECTOMY  2000  . LAPAROSCOPIC CHOLECYSTECTOMY  05-31-2007  . LAPAROSCOPIC LASER ABLATION ENDOMETRIOSIS AND LEFT SALPINGO-OOPHORECTOMY  11-03-1999  . LAPAROSCOPY WITH RIGHT SALPINGO-OOPHECTOMY/ LYSIS ADHESIONS AND ABLATION ENDOMETRIOSIS  05-17-2001  . LIPOSUCTION Bilateral 03/26/2015   Procedure: WITH LIPOSUCTION;  Surgeon: Wallace Going, DO;  Location: [place];  Service: Plastics;  Laterality: Bilateral;  . LUMBAR LAMINECTOMY  2003   L4 - L5  . PLANTAR FASCIA SURGERY     right  . PLANTAR FASCIA SURGERY  02/2011   left  . RIGHT URETEROSCOPIC STONE EXTRACTION  08-04-2000  . TONSILLECTOMY    . URETERAL STENT PLACEMENT  11/12/11   Dr Tresa Moore    Social History   Socioeconomic History  . Marital status: Married    Spouse name: Darnelle Maffucci  . Number of children: 0  . Years of education: college  . Highest education level: None  Social Needs  . Financial resource strain: None  . Food insecurity - worry: None  . Food insecurity - inability: None  . Transportation needs - medical: None  . Transportation needs - non-medical: None  Occupational History  . Occupation: disabled  Tobacco Use    . Smoking status: Never Smoker  . Smokeless tobacco: Never Used  Substance and Sexual Activity  . Alcohol use: Yes    Alcohol/week: 0.6 oz    Types: 1 Shots of liquor per week    Comment:  very rarely  . Drug use: No  . Sexual activity: Yes    Birth control/protection: Surgical  Other Topics Concern  . None  Social History Narrative   Patient Lives at home with her husband Darnelle Maffucci)   Disabled.   Education two years of college.   Right handed.   Caffeine coffee and sweet tea. Not daily.    Family History  Problem Relation Age of Onset  . Hypertension Mother   . Colon polyps Mother   . Atrial fibrillation Mother        Dr Angelena Form  . Hyperlipidemia Mother   . Heart disease Mother   . Anxiety disorder Mother   . Sleep apnea Mother   . Obesity Mother   . Heart attack Father 52  . Diabetes Father   . Hypertension Father   . Hyperlipidemia Father   . Heart disease Father   . Stroke Father   . Obesity Father   . Hypertension Sister   . Heart attack Paternal Grandmother 22  . Breast cancer Paternal Aunt   . Colon cancer Maternal Uncle   . Colon cancer Paternal Uncle   . Colon polyps Maternal Grandmother   . Stroke Maternal Grandmother 76  . Diabetes Paternal Grandfather   . Stroke Maternal Grandfather 37    Review of Systems  Constitutional: Negative for chills and fever.  Respiratory: Positive for cough (dry at night - ? allergies). Negative for shortness of breath and wheezing.   Cardiovascular: Positive for palpitations (rare). Negative for chest pain and leg swelling.  Gastrointestinal: Positive for abdominal pain.  Genitourinary: Positive for dysuria, frequency and hematuria.  Musculoskeletal: Positive for back pain.  Neurological: Negative for light-headedness and headaches.       Objective:   Vitals:   02/16/17 1002  BP: 122/80  Pulse: 82  Resp: 16  Temp: 99.6 F (37.6 C)  SpO2: 96%   Wt Readings from Last 3 Encounters:  02/16/17 234 lb (106.1  kg)  02/06/17 231 lb (104.8  kg)  01/25/17 231 lb (104.8 kg)   Body mass index is 35.58 kg/m.   Physical Exam    Constitutional: Appears well-developed and well-nourished. No distress.  HENT:  Head: Normocephalic and atraumatic.  Neck: Neck supple. No tracheal deviation present. No thyromegaly present.  No cervical lymphadenopathy Cardiovascular: Normal rate, regular rhythm and normal heart sounds.   No murmur heard. No carotid bruit .  No edema Pulmonary/Chest: Effort normal and breath sounds normal. No respiratory distress. No has no wheezes. No rales.  Abdomen: Mild tenderness bilateral lower abdomen without rebound or guarding, no other abdominal tenderness GU: No CVA tenderness Skin: Skin is warm and dry. Not diaphoretic.  Psychiatric: Normal mood and affect. Behavior is normal.      Assessment & Plan:    See Problem List for Assessment and Plan of chronic medical problems.

## 2017-02-16 NOTE — Patient Instructions (Addendum)
  Medications reviewed and updated.  Changes include pain medication for your kidney stones.   Your prescription(s) have been submitted to your pharmacy. Please take as directed and contact our office if you believe you are having problem(s) with the medication(s).   Please followup in 6 months

## 2017-02-16 NOTE — Assessment & Plan Note (Signed)
Lab Results  Component Value Date   TSH 1.54 08/16/2016   TSH 6 months ago in normal range Will recheck at her next visit in 6 months Continue current dose of levothyroxine

## 2017-02-16 NOTE — Assessment & Plan Note (Signed)
Lab Results  Component Value Date   HGBA1C 5.6 02/06/2017   Sugars improved with diet changes and exercise Working on weight loss F/u in 6 months

## 2017-02-17 ENCOUNTER — Inpatient Hospital Stay: Admission: RE | Admit: 2017-02-17 | Payer: PPO | Source: Ambulatory Visit

## 2017-02-17 LAB — URINE CULTURE
MICRO NUMBER: 81289828
SPECIMEN QUALITY:: ADEQUATE

## 2017-02-20 ENCOUNTER — Ambulatory Visit (INDEPENDENT_AMBULATORY_CARE_PROVIDER_SITE_OTHER)
Admission: RE | Admit: 2017-02-20 | Discharge: 2017-02-20 | Disposition: A | Discharge status: Home / Self Care | Payer: PPO | Source: Ambulatory Visit | Attending: Internal Medicine | Admitting: Internal Medicine

## 2017-02-20 ENCOUNTER — Ambulatory Visit (INDEPENDENT_AMBULATORY_CARE_PROVIDER_SITE_OTHER): Payer: PPO | Admitting: Family Medicine

## 2017-02-20 VITALS — BP 117/75 | HR 84 | Temp 98.9°F | Ht 68.0 in | Wt 233.0 lb

## 2017-02-20 DIAGNOSIS — Z6835 Body mass index (BMI) 35.0-35.9, adult: Secondary | ICD-10-CM

## 2017-02-20 DIAGNOSIS — E559 Vitamin D deficiency, unspecified: Secondary | ICD-10-CM

## 2017-02-20 DIAGNOSIS — F3289 Other specified depressive episodes: Secondary | ICD-10-CM

## 2017-02-20 DIAGNOSIS — R109 Unspecified abdominal pain: Secondary | ICD-10-CM | POA: Diagnosis not present

## 2017-02-20 DIAGNOSIS — R7303 Prediabetes: Secondary | ICD-10-CM

## 2017-02-20 DIAGNOSIS — K429 Umbilical hernia without obstruction or gangrene: Secondary | ICD-10-CM | POA: Diagnosis not present

## 2017-02-20 MED ORDER — VITAMIN D (ERGOCALCIFEROL) 1.25 MG (50000 UNIT) PO CAPS: 50000.0000 [IU] | ORAL_CAPSULE | ORAL | 0 refills | Status: DC

## 2017-02-20 MED ORDER — METFORMIN HCL 500 MG PO TABS: 500.0000 mg | ORAL_TABLET | Freq: Two times a day (BID) | ORAL | 0 refills | Status: DC

## 2017-02-20 MED ORDER — BUPROPION HCL ER (SR) 200 MG PO TB12: 200.0000 mg | ORAL_TABLET | Freq: Every day | ORAL | 0 refills | Status: DC

## 2017-02-21 ENCOUNTER — Encounter: Payer: Self-pay | Admitting: Internal Medicine

## 2017-02-21 NOTE — Progress Notes (Signed)
Office: [phone]  /  Fax: [phone]   HPI:   Chief Complaint: OBESITY [name] is here to discuss her progress with her obesity treatment plan. She is on the Category 3 plan plus breakfast options and is following her eating plan approximately 80 % of the time. She states she is walking and painting for 60 minutes 6 times per week. [name] is retaining fluid today. She has struggled to stay on track and has increased her simple carbohydrates. [name] is concerned about holiday weight gain. Her weight is 233 lb (105.7 kg) today and has had a weight loss of 2 pounds over a period of 2 weeks since her last visit. She has lost 8 lbs since starting treatment with [place].  Vitamin D deficiency [name] has a diagnosis of vitamin D deficiency. She is currently stable on vit D but level is not improved, even on 3 months of weekly vitamin D prescription. She admits fatigue and denies nausea, vomiting or muscle weakness.  Pre-Diabetes [name] has a diagnosis of pre-diabetes based on her elevated Hgb A1c and was informed this puts her at greater risk of developing diabetes. Her A1c is stable but fasting insulin has worsened with increased simple carbohydrate intake. She is taking metformin currently and continues to work on diet and exercise to decrease risk of diabetes. She denies nausea or hypoglycemia.  Depression with emotional eating behaviors [name] mood is stable but she is still struggling with emotional eating and carbohydrate cravings. [name] struggles with emotional eating and using food for comfort to the extent that it is negatively impacting her health. She often snacks when she is not hungry. [name] sometimes feels she is out of control and then feels guilty that she made poor food choices. She has been working on behavior modification techniques to help reduce her emotional eating and has been somewhat successful. She shows no sign of suicidal or homicidal  ideations.  Depression screen [facility] 2/9 09/28/2016 07/29/2015 09/22/2014  Decreased Interest 2 0 0  Down, Depressed, Hopeless 1 0 0  PHQ - 2 Score 3 0 0  Altered sleeping 1 - -  Tired, decreased energy 2 - -  Change in appetite 1 - -  Feeling bad or failure about yourself  1 - -  Trouble concentrating 2 - -  Moving slowly or fidgety/restless 1 - -  Suicidal thoughts 0 - -  PHQ-9 Score 11 - -  Some recent data might be hidden      ALLERGIES: Allergies  Allergen Reactions  . Chloraprep One Step [Chlorhexidine Gluconate] Rash    Developed severe rash where chloraprep was used on chest area  . Ivp Dye [Iodinated Diagnostic Agents] Rash and Other (See Comments)    Flushing, minor facial rash & dyspnea  . Nalbuphine Shortness Of Breath and Rash    Nubain caused respiratory distress & rash  . Septra [Sulfamethoxazole-Trimethoprim] Shortness Of Breath and Rash  . Sulfamethoxazole-Trimethoprim Rash    rash  . Tylox [Oxycodone-Acetaminophen] Rash    MEDICATIONS: Current Outpatient Medications on File Prior to Visit  Medication Sig Dispense Refill  . aspirin 81 MG tablet Take 81 mg by mouth daily.     [name] DOXEPIN HCL PO Take by mouth at bedtime.    . Fluticasone-Salmeterol (ADVAIR DISKUS) 100-50 MCG/DOSE AEPB Inhale 1 puff into the lungs 2 (two) times daily. 60 each 5  . gabapentin (NEURONTIN) 100 MG capsule Take 2 capsules (200 mg total) by mouth at bedtime. (Patient taking differently: Take 100 mg  by mouth at bedtime. ) 60 capsule 3  . hydrochlorothiazide (MICROZIDE) 12.5 MG capsule Take 1 capsule (12.5 mg total) by mouth daily. 90 capsule 2  . HYDROcodone-acetaminophen (NORCO/VICODIN) 5-325 MG tablet Take 1-2 tablets every 6 (six) hours as needed by mouth for moderate pain. 30 tablet 0  . lisinopril (PRINIVIL,ZESTRIL) 40 MG tablet TAKE 1 TABLET EVERY DAY 90 tablet 2  . montelukast (SINGULAIR) 10 MG tablet Take 1 tablet (10 mg total) by mouth at bedtime. 90 tablet 3  . polyethylene  glycol powder (GLYCOLAX/MIRALAX) powder Take 17 g by mouth daily. 3350 g 0   No current facility-administered medications on file prior to visit.     PAST MEDICAL HISTORY: Past Medical History:  Diagnosis Date  . Anemia   . Anxiety   . Arthritis    inflammitory  . Asthma   . B12 deficiency   . Back pain   . Chronic joint pain   . Constipation   . DDD (degenerative disc disease), lumbar    low back and neck and shoulders  . Depression    during divorce & legal matters  . Fatty liver   . Fibromyalgia   . Gallbladder problem   . GERD (gastroesophageal reflux disease)   . History of kidney stones    Dr Phebe Colla  . History of polycystic ovarian disease S/P BSO  . Hx of anxiety disorder   . Hypertension    a  . Hypothyroidism    no meds  . IBS (irritable bowel syndrome)   . IBS (irritable bowel syndrome)   . Infertility, female   . Joint pain   . Kidney problem   . Lactose intolerance   . Lupus hx positive ANA   followed by Dr Gavin Pound; possible lupus  . Multiple food allergies   . Myalgia   . Osteoarthritis   . 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 POLYCYSTIC OVARIAN DISEASE 03/02/2007   Qualifier: Diagnosis of  By: Linna Darner MD, Rae Mar BSO for cysts & endometriosis; Dr Reino Kent, Gyn Seeing Dr Paula Compton    . PONV (postoperative nausea and vomiting)   . Prediabetes   . Sweating profusely   . Symptomatic mammary hypertrophy   . URI (upper respiratory infection)    currently on cefdinir    PAST SURGICAL HISTORY: Past Surgical History:  Procedure Laterality Date  . JAARS   exploratory lap  . BILATERAL BREAST REDUCTION Bilateral 03/26/2015   Performed by Wallace Going, DO at [facility]  . CARPAL TUNNEL RELEASE     bilateral; Dr Sherwood Gambler  . COLONOSCOPY     in 1990s  . CYSTO/ RIGHT RETROGRADE URETERAL PYELOGRAM  07-09-2004   HX BILATERAL RENAL STONES/ RIGHT FLANK PAIN  . CYSTOSCOPY WITH  RETROGRADE PYELOGRAM/URETERAL STENT PLACEMENT Right 11/12/2011   Performed by Alexis Frock, MD at [facility] ORS  . CYSTOSCOPY WITH STENT REMOVAL Right 12/07/2011   Performed by Alexis Frock, MD at [facility]  . CYSTOSCOPY/RETROGRADE/URETEROSCOPY/STONE EXTRACTION WITH BASKET Right 12/07/2011   Performed by Alexis Frock, MD at [facility]  . DILATION AND CURETTAGE OF UTERUS     X5  . EXCISION LEFT BARTHOLIN GLAND  02-05-2002  . EYE SURGERY     Retinal tears surgery bilateral  . LAPAROSCOPIC ASSISTED VAGINAL HYSTERECTOMY  2000  . LAPAROSCOPIC CHOLECYSTECTOMY  05-31-2007  . LAPAROSCOPIC LASER ABLATION ENDOMETRIOSIS AND LEFT SALPINGO-OOPHORECTOMY  11-03-1999  . LAPAROSCOPY WITH RIGHT SALPINGO-OOPHECTOMY/ LYSIS ADHESIONS AND ABLATION ENDOMETRIOSIS  05-17-2001  . LUMBAR LAMINECTOMY  2003   L4 - L5  . PLANTAR FASCIA SURGERY     right  . PLANTAR FASCIA SURGERY  02/2011   left  . RIGHT URETEROSCOPIC STONE EXTRACTION  08-04-2000  . TONSILLECTOMY    . URETERAL STENT PLACEMENT  11/12/11   Dr Tresa Moore  . WITH LIPOSUCTION Bilateral 03/26/2015   Performed by Wallace Going, DO at [place]: Social History   Tobacco Use  . Smoking status: Never Smoker  . Smokeless tobacco: Never Used  Substance Use Topics  . Alcohol use: Yes    Alcohol/week: 0.6 oz    Types: 1 Shots of liquor per week    Comment:  very rarely  . Drug use: No    FAMILY HISTORY: Family History  Problem Relation Age of Onset  . Hypertension Mother   . Colon polyps Mother   . Atrial fibrillation Mother        Dr Angelena Form  . Hyperlipidemia Mother   . Heart disease Mother   . Anxiety disorder Mother   . Sleep apnea Mother   . Obesity Mother   . Heart attack Father 40  . Diabetes Father   . Hypertension Father   . Hyperlipidemia Father   . Heart disease Father   . Stroke Father   . Obesity Father   . Hypertension Sister   . Heart attack Paternal  Grandmother 67  . Breast cancer Paternal Aunt   . Colon cancer Maternal Uncle   . Colon cancer Paternal Uncle   . Colon polyps Maternal Grandmother   . Stroke Maternal Grandmother 76  . Diabetes Paternal Grandfather   . Stroke Maternal Grandfather 37    ROS: Review of Systems  Constitutional: Positive for malaise/fatigue and weight loss.  Gastrointestinal: Negative for nausea and vomiting.  Musculoskeletal:       Negative muscle weakness  Endo/Heme/Allergies:       Negative hypoglycemia  Psychiatric/Behavioral: Positive for depression. Negative for suicidal ideas.    PHYSICAL EXAM: Blood pressure 117/75, pulse 84, temperature 98.9 F (37.2 C), temperature source Oral, height 5\' 8"  (1.727 m), weight 233 lb (105.7 kg), SpO2 99 %. Body mass index is 35.43 kg/m. Physical Exam  Constitutional: She is oriented to person, place, and time. [name].  Cardiovascular: Normal rate.  Pulmonary/Chest: Effort normal.  Musculoskeletal: Normal range of motion.  Neurological: She is oriented to person, place, and time.  Skin: Skin is warm and dry.  Psychiatric: She has a normal mood and affect. Her behavior is normal.  Vitals reviewed.   RECENT LABS AND TESTS: BMET    Component Value Date/Time   NA 143 02/06/2017 0822   K 4.6 02/06/2017 0822   CL 105 02/06/2017 0822   CO2 24 02/06/2017 0822   GLUCOSE 91 02/06/2017 0822   GLUCOSE 92 08/16/2016 1011   BUN 16 02/06/2017 0822   CREATININE 0.76 02/06/2017 0822   CALCIUM 9.5 02/06/2017 0822   GFRNONAA 94 02/06/2017 0822   GFRAA 108 02/06/2017 0822   Lab Results  Component Value Date   HGBA1C 5.6 02/06/2017   HGBA1C 5.6 09/28/2016   HGBA1C 5.9 08/16/2016   HGBA1C 5.9 06/04/2015   HGBA1C 5.6 09/22/2014   Lab Results  Component Value Date   INSULIN 28.1 (H) 02/06/2017   INSULIN 14.7 09/28/2016   CBC  Component Value Date/Time   WBC 6.6 08/16/2016 1011   RBC 4.68 08/16/2016 1011   HGB  13.2 08/16/2016 1011   HCT 39.4 08/16/2016 1011   PLT 235.0 08/16/2016 1011   MCV 84.2 08/16/2016 1011   MCH 27.0 04/19/2015 0650   MCHC 33.4 08/16/2016 1011   RDW 13.9 08/16/2016 1011   LYMPHSABS 1.8 08/16/2016 1011   MONOABS 0.6 08/16/2016 1011   EOSABS 0.2 08/16/2016 1011   BASOSABS 0.0 08/16/2016 1011   Iron/TIBC/Ferritin/ %Sat No results found for: IRON, TIBC, FERRITIN, IRONPCTSAT Lipid Panel     Component Value Date/Time   CHOL 153 02/06/2017 0822   TRIG 78 02/06/2017 0822   HDL 51 02/06/2017 0822   CHOLHDL 3 08/16/2016 1011   VLDL 12.2 08/16/2016 1011   LDLCALC 86 02/06/2017 0822   Hepatic Function Panel     Component Value Date/Time   PROT 6.8 02/06/2017 0822   ALBUMIN 4.1 02/06/2017 0822   AST 20 02/06/2017 0822   ALT 23 02/06/2017 0822   ALKPHOS 94 02/06/2017 0822   BILITOT 0.2 02/06/2017 0822   BILIDIR 0.1 06/12/2013 1632   IBILI 0.2 06/11/2009 2309      Component Value Date/Time   TSH 1.54 08/16/2016 1011   TSH 2.40 06/04/2015 0857   TSH 2.45 02/16/2015 1706    ASSESSMENT AND PLAN: Vitamin D deficiency - Plan: Vitamin D, Ergocalciferol, (DRISDOL) 50000 units CAPS capsule  Prediabetes - Plan: metFORMIN (GLUCOPHAGE) 500 MG tablet  Other depression - with emotional eating - Plan: buPROPion (WELLBUTRIN SR) 200 MG 12 hr tablet  Class 2 severe obesity with serious comorbidity and body mass index (BMI) of 35.0 to 35.9 in adult, unspecified obesity type ([place])  PLAN:  Vitamin D Deficiency [name] was informed that low vitamin D levels contributes to fatigue and are associated with obesity, breast, and colon cancer. She agrees to increase prescription Vit D @50 ,000 IU to every 3 days #10 with no refills and will follow up for routine testing of vitamin D, at least 2-3 times per year. She was informed of the risk of over-replacement of vitamin D and agrees to not increase her dose unless he discusses this with [place] first. [name] agrees to follow up with our  clinic in 2 to 3 weeks.  Pre-Diabetes [name] will continue to work on weight loss, exercise, and decreasing simple carbohydrates in her diet to help decrease the risk of diabetes. We dicussed metformin including benefits and risks. She was informed that eating too many simple carbohydrates or too many calories at one sitting increases the likelihood of GI side effects. [name] requested metformin for now and a prescription was written today for metformin 500 mg bid #60 with no refills. [name] agreed to follow up with [place] as directed to monitor her progress.  Depression with Emotional Eating Behaviors We discussed behavior modification techniques today to help [name] deal with her emotional eating and depression. She has agreed to continue  Wellbutrin SR 200 mg qd #30 with no refills and cognitive behavioral therapy to help decrease emotional eating. [name] agrees to follow up as directed.  Obesity [name] is currently in the action stage of change. As such, her goal is to continue with weight loss efforts She has agreed to keep a food journal with 1500 calories and 85+grams of protein daily [place] has been instructed to work up to a goal of 150 minutes of combined cardio and strengthening exercise per week for weight loss and overall health benefits. We discussed  the following Behavioral Modification Strategies today: increasing lean protein intake, decreasing simple carbohydrates , dealing with family or coworker sabotage and holiday eating strategies   [name] has agreed to follow up with our clinic in 2 to 3 weeks. She was informed of the importance of frequent follow up visits to maximize her success with intensive lifestyle modifications for her multiple health conditions.  I, Doreene Nest, am acting as transcriptionist for Dennard Nip, MD  I have reviewed the above documentation for accuracy and completeness, and I agree with the above. -Dennard Nip, MD    OBESITY  BEHAVIORAL INTERVENTION VISIT  Today's visit was # 9 out of 22.  Starting weight: 241 lbs Starting date: 09/28/16 Today's weight : 233 lbs Today's date: 02/20/2017 Total lbs lost to date: 8 (Patients must lose 7 lbs in the first 6 months to continue with counseling)   ASK: We discussed the diagnosis of obesity with [name] today and [name] agreed to give [place] permission to discuss obesity behavioral modification therapy today.  ASSESS: [name] has the diagnosis of obesity and her BMI today is 35.44 [name] is in the action stage of change   ADVISE: [name] was educated on the multiple health risks of obesity as well as the benefit of weight loss to improve her health. She was advised of the need for long term treatment and the importance of lifestyle modifications.  AGREE: Multiple dietary modification options and treatment options were discussed and  [name] agreed to keep a food journal with 1500 calories and 85+ grams of protein daily We discussed the following Behavioral Modification Strategies today: increasing lean protein intake, decreasing simple carbohydrates , dealing with family or coworker sabotage and holiday eating strategies

## 2017-02-25 DIAGNOSIS — S93601A Unspecified sprain of right foot, initial encounter: Secondary | ICD-10-CM | POA: Diagnosis not present

## 2017-02-26 ENCOUNTER — Encounter: Payer: Self-pay | Admitting: Internal Medicine

## 2017-02-26 DIAGNOSIS — K5730 Diverticulosis of large intestine without perforation or abscess without bleeding: Secondary | ICD-10-CM | POA: Insufficient documentation

## 2017-02-26 DIAGNOSIS — K429 Umbilical hernia without obstruction or gangrene: Secondary | ICD-10-CM | POA: Insufficient documentation

## 2017-03-06 ENCOUNTER — Ambulatory Visit: Payer: PPO | Admitting: Podiatry

## 2017-03-07 ENCOUNTER — Ambulatory Visit (INDEPENDENT_AMBULATORY_CARE_PROVIDER_SITE_OTHER): Payer: PPO | Admitting: Family Medicine

## 2017-03-07 VITALS — BP 121/77 | HR 89 | Temp 98.3°F | Ht 68.0 in | Wt 233.0 lb

## 2017-03-07 DIAGNOSIS — Z6835 Body mass index (BMI) 35.0-35.9, adult: Secondary | ICD-10-CM | POA: Diagnosis not present

## 2017-03-07 DIAGNOSIS — R7303 Prediabetes: Secondary | ICD-10-CM | POA: Diagnosis not present

## 2017-03-07 NOTE — Progress Notes (Signed)
Office: [phone]  /  Fax: [phone]   HPI:   Chief Complaint: OBESITY [name] is here to discuss her progress with her obesity treatment plan. She is on the keep a food journal with 1500 calories and 85+ grams of protein  and is following her eating plan approximately 100 % of the time. She states she is exercising 0 minutes 0 times per week. [name] has done well maintaining her weight over Thanksgiving. She has lots of celebrations coming up and is worried about holiday weight gain. Her weight is 233 lb (105.7 kg) today and has maintained weight over a period of 2 weeks since her last visit. She has lost 8 lbs since starting treatment with [place].  Pre-Diabetes [name] has a diagnosis of pre-diabetes based on her elevated Hgb A1c and was informed this puts her at greater risk of developing diabetes. She is taking metformin currently and denies GI upset with metformin. [name] continues to work on diet and exercise to decrease risk of diabetes. She denies nausea or hypoglycemia.  ALLERGIES: Allergies  Allergen Reactions  . Chloraprep One Step [Chlorhexidine Gluconate] Rash    Developed severe rash where chloraprep was used on chest area  . Ivp Dye [Iodinated Diagnostic Agents] Rash and Other (See Comments)    Flushing, minor facial rash & dyspnea  . Nalbuphine Shortness Of Breath and Rash    Nubain caused respiratory distress & rash  . Septra [Sulfamethoxazole-Trimethoprim] Shortness Of Breath and Rash  . Sulfamethoxazole-Trimethoprim Rash    rash  . Tylox [Oxycodone-Acetaminophen] Rash    MEDICATIONS: Current Outpatient Medications on File Prior to Visit  Medication Sig Dispense Refill  . aspirin 81 MG tablet Take 81 mg by mouth daily.     [name] buPROPion (WELLBUTRIN SR) 200 MG 12 hr tablet Take 1 tablet (200 mg total) daily by mouth. 30 tablet 0  . DOXEPIN HCL PO Take by mouth at bedtime.    . Fluticasone-Salmeterol (ADVAIR DISKUS) 100-50 MCG/DOSE AEPB Inhale 1 puff into  the lungs 2 (two) times daily. 60 each 5  . gabapentin (NEURONTIN) 100 MG capsule Take 2 capsules (200 mg total) by mouth at bedtime. (Patient taking differently: Take 100 mg by mouth at bedtime. ) 60 capsule 3  . hydrochlorothiazide (MICROZIDE) 12.5 MG capsule Take 1 capsule (12.5 mg total) by mouth daily. 90 capsule 2  . HYDROcodone-acetaminophen (NORCO/VICODIN) 5-325 MG tablet Take 1-2 tablets every 6 (six) hours as needed by mouth for moderate pain. 30 tablet 0  . lisinopril (PRINIVIL,ZESTRIL) 40 MG tablet TAKE 1 TABLET EVERY DAY 90 tablet 2  . metFORMIN (GLUCOPHAGE) 500 MG tablet Take 1 tablet (500 mg total) 2 (two) times daily by mouth. 60 tablet 0  . montelukast (SINGULAIR) 10 MG tablet Take 1 tablet (10 mg total) by mouth at bedtime. 90 tablet 3  . polyethylene glycol powder (GLYCOLAX/MIRALAX) powder Take 17 g by mouth daily. 3350 g 0  . Vitamin D, Ergocalciferol, (DRISDOL) 50000 units CAPS capsule Take 1 capsule (50,000 Units total) every 7 (seven) days by mouth. 4 capsule 0   No current facility-administered medications on file prior to visit.     PAST MEDICAL HISTORY: Past Medical History:  Diagnosis Date  . Anemia   . Anxiety   . Arthritis    inflammitory  . Asthma   . B12 deficiency   . Back pain   . Chronic joint pain   . Constipation   . DDD (degenerative disc disease), lumbar    low back  and neck and shoulders  . Depression    during divorce & legal matters  . Fatty liver   . Fibromyalgia   . Gallbladder problem   . GERD (gastroesophageal reflux disease)   . History of kidney stones    Dr Phebe Colla  . History of polycystic ovarian disease S/P BSO  . Hx of anxiety disorder   . Hypertension    a  . Hypothyroidism    no meds  . IBS (irritable bowel syndrome)   . IBS (irritable bowel syndrome)   . Infertility, female   . Joint pain   . Kidney problem   . Lactose intolerance   . Lupus hx positive ANA   followed by Dr Gavin Pound; possible lupus  .  Multiple food allergies   . Myalgia   . Osteoarthritis   . 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 POLYCYSTIC OVARIAN DISEASE 03/02/2007   Qualifier: Diagnosis of  By: Linna Darner MD, Rae Mar BSO for cysts & endometriosis; Dr Reino Kent, Gyn Seeing Dr Paula Compton    . PONV (postoperative nausea and vomiting)   . Prediabetes   . Sweating profusely   . Symptomatic mammary hypertrophy   . URI (upper respiratory infection)    currently on cefdinir    PAST SURGICAL HISTORY: Past Surgical History:  Procedure Laterality Date  . [place]   exploratory lap  . BREAST REDUCTION SURGERY Bilateral 03/26/2015   Procedure: BILATERAL BREAST REDUCTION ;  Surgeon: Wallace Going, DO;  Location: [place];  Service: Plastics;  Laterality: Bilateral;  . CARPAL TUNNEL RELEASE     bilateral; Dr Sherwood Gambler  . COLONOSCOPY     in 1990s  . CYSTO/ RIGHT RETROGRADE URETERAL PYELOGRAM  07-09-2004   HX BILATERAL RENAL STONES/ RIGHT FLANK PAIN  . CYSTOSCOPY W/ URETERAL STENT PLACEMENT  11/12/2011   Procedure: CYSTOSCOPY WITH RETROGRADE PYELOGRAM/URETERAL STENT PLACEMENT;  Surgeon: Alexis Frock, MD;  Location: [facility];  Service: Urology;  Laterality: Right;  . CYSTOSCOPY W/ URETERAL STENT REMOVAL  12/07/2011   Procedure: CYSTOSCOPY WITH STENT REMOVAL;  Surgeon: Alexis Frock, MD;  Location: [facility];  Service: Urology;  Laterality: Right;  . CYSTOSCOPY/RETROGRADE/URETEROSCOPY/STONE EXTRACTION WITH BASKET  12/07/2011   Procedure: CYSTOSCOPY/RETROGRADE/URETEROSCOPY/STONE EXTRACTION WITH BASKET;  Surgeon: Alexis Frock, MD;  Location: [facility];  Service: Urology;  Laterality: Right;  . DILATION AND CURETTAGE OF UTERUS     X5  . EXCISION LEFT BARTHOLIN GLAND  02-05-2002  . EYE SURGERY     Retinal tears surgery bilateral  . LAPAROSCOPIC ASSISTED VAGINAL HYSTERECTOMY  2000  . LAPAROSCOPIC CHOLECYSTECTOMY  05-31-2007  .  LAPAROSCOPIC LASER ABLATION ENDOMETRIOSIS AND LEFT SALPINGO-OOPHORECTOMY  11-03-1999  . LAPAROSCOPY WITH RIGHT SALPINGO-OOPHECTOMY/ LYSIS ADHESIONS AND ABLATION ENDOMETRIOSIS  05-17-2001  . LIPOSUCTION Bilateral 03/26/2015   Procedure: WITH LIPOSUCTION;  Surgeon: Wallace Going, DO;  Location: [facility] [place];  Service: Plastics;  Laterality: Bilateral;  . LUMBAR LAMINECTOMY  2003   L4 - L5  . PLANTAR FASCIA SURGERY     right  . PLANTAR FASCIA SURGERY  02/2011   left  . RIGHT URETEROSCOPIC STONE EXTRACTION  08-04-2000  . TONSILLECTOMY    . URETERAL STENT PLACEMENT  11/12/11   Dr Tresa Moore    SOCIAL HISTORY: Social History   Tobacco Use  . Smoking status: Never Smoker  . Smokeless tobacco: Never Used  Substance Use Topics  .  Alcohol use: Yes    Alcohol/week: 0.6 oz    Types: 1 Shots of liquor per week    Comment:  very rarely  . Drug use: No    FAMILY HISTORY: Family History  Problem Relation Age of Onset  . Hypertension Mother   . Colon polyps Mother   . Atrial fibrillation Mother        Dr Angelena Form  . Hyperlipidemia Mother   . Heart disease Mother   . Anxiety disorder Mother   . Sleep apnea Mother   . Obesity Mother   . Heart attack Father 23  . Diabetes Father   . Hypertension Father   . Hyperlipidemia Father   . Heart disease Father   . Stroke Father   . Obesity Father   . Hypertension Sister   . Heart attack Paternal Grandmother 56  . Breast cancer Paternal Aunt   . Colon cancer Maternal Uncle   . Colon cancer Paternal Uncle   . Colon polyps Maternal Grandmother   . Stroke Maternal Grandmother 76  . Diabetes Paternal Grandfather   . Stroke Maternal Grandfather 37    ROS: Review of Systems  Constitutional: Negative for weight loss.  Gastrointestinal: Negative for diarrhea, nausea and vomiting.  Endo/Heme/Allergies:       Negative hypoglycemia    PHYSICAL EXAM: Blood pressure 121/77, pulse 89, temperature 98.3 F (36.8 C), temperature  source Oral, height 5\' 8"  (1.727 m), weight 233 lb (105.7 kg), SpO2 99 %. Body mass index is 35.43 kg/m. Physical Exam  Constitutional: She is oriented to person, place, and time. [name].  Cardiovascular: Normal rate.  Pulmonary/Chest: Effort normal.  Musculoskeletal: Normal range of motion.  Neurological: She is oriented to person, place, and time.  Skin: Skin is warm and dry.  Psychiatric: She has a normal mood and affect. Her behavior is normal.  Vitals reviewed.   RECENT LABS AND TESTS: BMET    Component Value Date/Time   NA 143 02/06/2017 0822   K 4.6 02/06/2017 0822   CL 105 02/06/2017 0822   CO2 24 02/06/2017 0822   GLUCOSE 91 02/06/2017 0822   GLUCOSE 92 08/16/2016 1011   BUN 16 02/06/2017 0822   CREATININE 0.76 02/06/2017 0822   CALCIUM 9.5 02/06/2017 0822   GFRNONAA 94 02/06/2017 0822   GFRAA 108 02/06/2017 0822   Lab Results  Component Value Date   HGBA1C 5.6 02/06/2017   HGBA1C 5.6 09/28/2016   HGBA1C 5.9 08/16/2016   HGBA1C 5.9 06/04/2015   HGBA1C 5.6 09/22/2014   Lab Results  Component Value Date   INSULIN 28.1 (H) 02/06/2017   INSULIN 14.7 09/28/2016   CBC    Component Value Date/Time   WBC 6.6 08/16/2016 1011   RBC 4.68 08/16/2016 1011   HGB 13.2 08/16/2016 1011   HCT 39.4 08/16/2016 1011   PLT 235.0 08/16/2016 1011   MCV 84.2 08/16/2016 1011   MCH 27.0 04/19/2015 0650   MCHC 33.4 08/16/2016 1011   RDW 13.9 08/16/2016 1011   LYMPHSABS 1.8 08/16/2016 1011   MONOABS 0.6 08/16/2016 1011   EOSABS 0.2 08/16/2016 1011   BASOSABS 0.0 08/16/2016 1011   Iron/TIBC/Ferritin/ %Sat No results found for: IRON, TIBC, FERRITIN, IRONPCTSAT Lipid Panel     Component Value Date/Time   CHOL 153 02/06/2017 0822   TRIG 78 02/06/2017 0822   HDL 51 02/06/2017 0822   CHOLHDL 3 08/16/2016 1011   VLDL 12.2 08/16/2016 1011   LDLCALC 86 02/06/2017 1017  Hepatic Function Panel     Component Value Date/Time   PROT 6.8  02/06/2017 0822   ALBUMIN 4.1 02/06/2017 0822   AST 20 02/06/2017 0822   ALT 23 02/06/2017 0822   ALKPHOS 94 02/06/2017 0822   BILITOT 0.2 02/06/2017 0822   BILIDIR 0.1 06/12/2013 1632   IBILI 0.2 06/11/2009 2309      Component Value Date/Time   TSH 1.54 08/16/2016 1011   TSH 2.40 06/04/2015 0857   TSH 2.45 02/16/2015 1706    ASSESSMENT AND PLAN: Prediabetes  Class 2 severe obesity with serious comorbidity and body mass index (BMI) of 35.0 to 35.9 in adult, unspecified obesity type ([place])  PLAN:  Pre-Diabetes [name] will continue to work on weight loss, exercise, and decreasing simple carbohydrates in her diet to help decrease the risk of diabetes. We dicussed metformin including benefits and risks. She was informed that eating too many simple carbohydrates or too many calories at one sitting increases the likelihood of GI side effects. [name] will continue metformin for now and a prescription was not written today. [name] agreed to follow up with [place] as directed to monitor her progress.  We spent > than 50% of the 15 minute visit on the counseling as documented in the note.  Obesity [name] is currently in the action stage of change. As such, her goal is to continue with weight loss efforts She has agreed to keep a food journal with 1200 to 1500 calories and 85 grams of protein  [name] has been instructed to work up to a goal of 150 minutes of combined cardio and strengthening exercise per week for weight loss and overall health benefits. We discussed the following Behavioral Modification Strategies today: keep a strict food journal, increasing lean protein intake and work on meal planning and easy cooking plans  Goal is to maintain weight over the holidays and change diet prescription the 1st of the year.  [name] has agreed to follow up with our clinic in 2 weeks. She was informed of the importance of frequent follow up visits to maximize her success with intensive  lifestyle modifications for her multiple health conditions.  I, Doreene Nest, am acting as transcriptionist for Dennard Nip, MD  I have reviewed the above documentation for accuracy and completeness, and I agree with the above. -Dennard Nip, MD    OBESITY BEHAVIORAL INTERVENTION VISIT  Today's visit was # 10 out of 22.  Starting weight: 241 lbs Starting date: 09/28/16 Today's weight : 233 lbs Today's date: 03/07/2017 Total lbs lost to date: 8 (Patients must lose 7 lbs in the first 6 months to continue with counseling)   ASK: We discussed the diagnosis of obesity with [name] today and [name] agreed to give [place] permission to discuss obesity behavioral modification therapy today.  ASSESS: [name] has the diagnosis of obesity and her BMI today is 35.44 [name] is in the action stage of change   ADVISE: [name] was educated on the multiple health risks of obesity as well as the benefit of weight loss to improve her health. She was advised of the need for long term treatment and the importance of lifestyle modifications.  AGREE: Multiple dietary modification options and treatment options were discussed and  [name] agreed to keep a food journal with 1200 to 1500 calories and 85 grams of protein daily We discussed the following Behavioral Modification Strategies today: keep a strict food journal, increasing lean protein intake and work on meal planning and easy cooking plans

## 2017-03-08 DIAGNOSIS — M25511 Pain in right shoulder: Secondary | ICD-10-CM | POA: Diagnosis not present

## 2017-03-08 DIAGNOSIS — M5412 Radiculopathy, cervical region: Secondary | ICD-10-CM | POA: Diagnosis not present

## 2017-03-08 DIAGNOSIS — M542 Cervicalgia: Secondary | ICD-10-CM | POA: Diagnosis not present

## 2017-03-08 DIAGNOSIS — M533 Sacrococcygeal disorders, not elsewhere classified: Secondary | ICD-10-CM | POA: Diagnosis not present

## 2017-03-10 DIAGNOSIS — M5417 Radiculopathy, lumbosacral region: Secondary | ICD-10-CM | POA: Diagnosis not present

## 2017-03-10 DIAGNOSIS — M6281 Muscle weakness (generalized): Secondary | ICD-10-CM | POA: Diagnosis not present

## 2017-03-10 DIAGNOSIS — M545 Low back pain: Secondary | ICD-10-CM | POA: Diagnosis not present

## 2017-03-10 DIAGNOSIS — R262 Difficulty in walking, not elsewhere classified: Secondary | ICD-10-CM | POA: Diagnosis not present

## 2017-03-15 DIAGNOSIS — M6281 Muscle weakness (generalized): Secondary | ICD-10-CM | POA: Diagnosis not present

## 2017-03-15 DIAGNOSIS — Z1231 Encounter for screening mammogram for malignant neoplasm of breast: Secondary | ICD-10-CM | POA: Diagnosis not present

## 2017-03-15 DIAGNOSIS — M545 Low back pain: Secondary | ICD-10-CM | POA: Diagnosis not present

## 2017-03-15 DIAGNOSIS — M5417 Radiculopathy, lumbosacral region: Secondary | ICD-10-CM | POA: Diagnosis not present

## 2017-03-15 DIAGNOSIS — R262 Difficulty in walking, not elsewhere classified: Secondary | ICD-10-CM | POA: Diagnosis not present

## 2017-03-15 LAB — HM MAMMOGRAPHY

## 2017-03-17 DIAGNOSIS — M5417 Radiculopathy, lumbosacral region: Secondary | ICD-10-CM | POA: Diagnosis not present

## 2017-03-17 DIAGNOSIS — M545 Low back pain: Secondary | ICD-10-CM | POA: Diagnosis not present

## 2017-03-17 DIAGNOSIS — R262 Difficulty in walking, not elsewhere classified: Secondary | ICD-10-CM | POA: Diagnosis not present

## 2017-03-17 DIAGNOSIS — M6281 Muscle weakness (generalized): Secondary | ICD-10-CM | POA: Diagnosis not present

## 2017-03-21 ENCOUNTER — Ambulatory Visit (INDEPENDENT_AMBULATORY_CARE_PROVIDER_SITE_OTHER): Payer: PPO | Admitting: Family Medicine

## 2017-03-21 VITALS — BP 120/87 | HR 77 | Temp 98.7°F | Ht 68.0 in | Wt 233.0 lb

## 2017-03-21 DIAGNOSIS — R262 Difficulty in walking, not elsewhere classified: Secondary | ICD-10-CM | POA: Diagnosis not present

## 2017-03-21 DIAGNOSIS — M5417 Radiculopathy, lumbosacral region: Secondary | ICD-10-CM | POA: Diagnosis not present

## 2017-03-21 DIAGNOSIS — Z6835 Body mass index (BMI) 35.0-35.9, adult: Secondary | ICD-10-CM

## 2017-03-21 DIAGNOSIS — F3289 Other specified depressive episodes: Secondary | ICD-10-CM | POA: Diagnosis not present

## 2017-03-21 DIAGNOSIS — M6281 Muscle weakness (generalized): Secondary | ICD-10-CM | POA: Diagnosis not present

## 2017-03-21 DIAGNOSIS — E559 Vitamin D deficiency, unspecified: Secondary | ICD-10-CM

## 2017-03-21 DIAGNOSIS — M545 Low back pain: Secondary | ICD-10-CM | POA: Diagnosis not present

## 2017-03-21 DIAGNOSIS — E8881 Metabolic syndrome: Secondary | ICD-10-CM

## 2017-03-21 MED ORDER — VITAMIN D (ERGOCALCIFEROL) 1.25 MG (50000 UNIT) PO CAPS: 50000.0000 [IU] | ORAL_CAPSULE | ORAL | 0 refills | Status: DC

## 2017-03-21 MED ORDER — METFORMIN HCL 500 MG PO TABS: 500.0000 mg | ORAL_TABLET | Freq: Two times a day (BID) | ORAL | 0 refills | Status: DC

## 2017-03-21 MED ORDER — BUPROPION HCL ER (SR) 200 MG PO TB12: 200.0000 mg | ORAL_TABLET | Freq: Every day | ORAL | 0 refills | Status: DC

## 2017-03-21 NOTE — Progress Notes (Signed)
Office: [phone]  /  Fax: [phone]   HPI:   Chief Complaint: OBESITY [name] is here to discuss her progress with her obesity treatment plan. She is on the keep a food journal with 1200-1500 calories and 85+ grams of protein daily and is following her eating plan approximately 75 % of the time. She states she is exercising 0 minutes 0 times per week. Lunna has done well maintaining her weight over the last weeks. She is not journaling as much but she is doing better with eating more mindfully. She has had significant family sabotage from her mother in-law.  Her weight is 233 lb (105.7 kg) today and has not lost weight since her last visit. She has lost 8 lbs since starting treatment with [place].  Vitamin D deficiency Eleah has a diagnosis of vitamin D deficiency. She is on prescription Vit D, but not yet at goal. She denies nausea, vomiting or muscle weakness.  Insulin Resistance Delissa has a diagnosis of insulin resistance based on her elevated fasting insulin level >5. Although Shaeley's blood glucose readings are still under good control, insulin resistance puts her at greater risk of metabolic syndrome and diabetes. She is taking metformin currently and attempting to diet improve, she notes both parents have diabetes mellitus.  Depression with emotional eating behaviors Keeleigh is stable on Wellbutrin and she is decreasing emotional eating. Ashana struggles with emotional eating and using food for comfort to the extent that it is negatively impacting her health. She often snacks when she is not hungry. Leonor sometimes feels she is out of control and then feels guilty that she made poor food choices. She has been working on behavior modification techniques to help reduce her emotional eating and has been somewhat successful. She shows no sign of suicidal or homicidal ideations.  Depression screen [facility] 2/9 09/28/2016 07/29/2015 09/22/2014  Decreased Interest 2 0 0  Down,  Depressed, Hopeless 1 0 0  PHQ - 2 Score 3 0 0  Altered sleeping 1 - -  Tired, decreased energy 2 - -  Change in appetite 1 - -  Feeling bad or failure about yourself  1 - -  Trouble concentrating 2 - -  Moving slowly or fidgety/restless 1 - -  Suicidal thoughts 0 - -  PHQ-9 Score 11 - -  Some recent data might be hidden   ALLERGIES: Allergies  Allergen Reactions  . Chloraprep One Step [Chlorhexidine Gluconate] Rash    Developed severe rash where chloraprep was used on chest area  . Ivp Dye [Iodinated Diagnostic Agents] Rash and Other (See Comments)    Flushing, minor facial rash & dyspnea  . Nalbuphine Shortness Of Breath and Rash    Nubain caused respiratory distress & rash  . Septra [Sulfamethoxazole-Trimethoprim] Shortness Of Breath and Rash  . Sulfamethoxazole-Trimethoprim Rash    rash  . Tylox [Oxycodone-Acetaminophen] Rash    MEDICATIONS: Current Outpatient Medications on File Prior to Visit  Medication Sig Dispense Refill  . aspirin 81 MG tablet Take 81 mg by mouth daily.     [name] DOXEPIN HCL PO Take by mouth at bedtime.    . Fluticasone-Salmeterol (ADVAIR DISKUS) 100-50 MCG/DOSE AEPB Inhale 1 puff into the lungs 2 (two) times daily. 60 each 5  . gabapentin (NEURONTIN) 100 MG capsule Take 2 capsules (200 mg total) by mouth at bedtime. (Patient taking differently: Take 100 mg by mouth at bedtime. ) 60 capsule 3  . hydrochlorothiazide (MICROZIDE) 12.5 MG capsule Take 1 capsule (12.5 mg  total) by mouth daily. 90 capsule 2  . HYDROcodone-acetaminophen (NORCO/VICODIN) 5-325 MG tablet Take 1-2 tablets every 6 (six) hours as needed by mouth for moderate pain. 30 tablet 0  . lisinopril (PRINIVIL,ZESTRIL) 40 MG tablet TAKE 1 TABLET EVERY DAY 90 tablet 2  . montelukast (SINGULAIR) 10 MG tablet Take 1 tablet (10 mg total) by mouth at bedtime. 90 tablet 3  . polyethylene glycol powder (GLYCOLAX/MIRALAX) powder Take 17 g by mouth daily. 3350 g 0   No current facility-administered  medications on file prior to visit.     PAST MEDICAL HISTORY: Past Medical History:  Diagnosis Date  . Anemia   . Anxiety   . Arthritis    inflammitory  . Asthma   . B12 deficiency   . Back pain   . Chronic joint pain   . Constipation   . DDD (degenerative disc disease), lumbar    low back and neck and shoulders  . Depression    during divorce & legal matters  . Fatty liver   . Fibromyalgia   . Gallbladder problem   . GERD (gastroesophageal reflux disease)   . History of kidney stones    Dr Phebe Colla  . History of polycystic ovarian disease S/P BSO  . Hx of anxiety disorder   . Hypertension    a  . Hypothyroidism    no meds  . IBS (irritable bowel syndrome)   . IBS (irritable bowel syndrome)   . Infertility, female   . Joint pain   . Kidney problem   . Lactose intolerance   . Lupus hx positive ANA   followed by Dr Gavin Pound; possible lupus  . Multiple food allergies   . Myalgia   . Osteoarthritis   . 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 POLYCYSTIC OVARIAN DISEASE 03/02/2007   Qualifier: Diagnosis of  By: Linna Darner MD, Rae Mar BSO for cysts & endometriosis; Dr Reino Kent, Gyn Seeing Dr Paula Compton    . PONV (postoperative nausea and vomiting)   . Prediabetes   . Sweating profusely   . Symptomatic mammary hypertrophy   . URI (upper respiratory infection)    currently on cefdinir    PAST SURGICAL HISTORY: Past Surgical History:  Procedure Laterality Date  . [place]   exploratory lap  . BREAST REDUCTION SURGERY Bilateral 03/26/2015   Procedure: BILATERAL BREAST REDUCTION ;  Surgeon: Wallace Going, DO;  Location: [facility];  Service: Plastics;  Laterality: Bilateral;  . CARPAL TUNNEL RELEASE     bilateral; Dr Sherwood Gambler  . COLONOSCOPY     in 1990s  . CYSTO/ RIGHT RETROGRADE URETERAL PYELOGRAM  07-09-2004   HX BILATERAL RENAL STONES/ RIGHT FLANK PAIN  . CYSTOSCOPY W/ URETERAL STENT PLACEMENT   11/12/2011   Procedure: CYSTOSCOPY WITH RETROGRADE PYELOGRAM/URETERAL STENT PLACEMENT;  Surgeon: Alexis Frock, MD;  Location: [facility];  Service: Urology;  Laterality: Right;  . CYSTOSCOPY W/ URETERAL STENT REMOVAL  12/07/2011   Procedure: CYSTOSCOPY WITH STENT REMOVAL;  Surgeon: Alexis Frock, MD;  Location: [facility];  Service: Urology;  Laterality: Right;  . CYSTOSCOPY/RETROGRADE/URETEROSCOPY/STONE EXTRACTION WITH BASKET  12/07/2011   Procedure: CYSTOSCOPY/RETROGRADE/URETEROSCOPY/STONE EXTRACTION WITH BASKET;  Surgeon: Alexis Frock, MD;  Location: [facility];  Service: Urology;  Laterality: Right;  . DILATION AND CURETTAGE OF UTERUS     X5  . EXCISION LEFT BARTHOLIN GLAND  02-05-2002  . EYE SURGERY  Retinal tears surgery bilateral  . LAPAROSCOPIC ASSISTED VAGINAL HYSTERECTOMY  2000  . LAPAROSCOPIC CHOLECYSTECTOMY  05-31-2007  . LAPAROSCOPIC LASER ABLATION ENDOMETRIOSIS AND LEFT SALPINGO-OOPHORECTOMY  11-03-1999  . LAPAROSCOPY WITH RIGHT SALPINGO-OOPHECTOMY/ LYSIS ADHESIONS AND ABLATION ENDOMETRIOSIS  05-17-2001  . LIPOSUCTION Bilateral 03/26/2015   Procedure: WITH LIPOSUCTION;  Surgeon: Wallace Going, DO;  Location: [facility];  Service: Plastics;  Laterality: Bilateral;  . LUMBAR LAMINECTOMY  2003   L4 - L5  . PLANTAR FASCIA SURGERY     right  . PLANTAR FASCIA SURGERY  02/2011   left  . RIGHT URETEROSCOPIC STONE EXTRACTION  08-04-2000  . TONSILLECTOMY    . URETERAL STENT PLACEMENT  11/12/11   Dr Tresa Moore    SOCIAL HISTORY: Social History   Tobacco Use  . Smoking status: Never Smoker  . Smokeless tobacco: Never Used  Substance Use Topics  . Alcohol use: Yes    Alcohol/week: 0.6 oz    Types: 1 Shots of liquor per week    Comment:  very rarely  . Drug use: No    FAMILY HISTORY: Family History  Problem Relation Age of Onset  . Hypertension Mother   . Colon polyps Mother   . Atrial fibrillation Mother        Dr  Angelena Form  . Hyperlipidemia Mother   . Heart disease Mother   . Anxiety disorder Mother   . Sleep apnea Mother   . Obesity Mother   . Heart attack Father 24  . Diabetes Father   . Hypertension Father   . Hyperlipidemia Father   . Heart disease Father   . Stroke Father   . Obesity Father   . Hypertension Sister   . Heart attack Paternal Grandmother 27  . Breast cancer Paternal Aunt   . Colon cancer Maternal Uncle   . Colon cancer Paternal Uncle   . Colon polyps Maternal Grandmother   . Stroke Maternal Grandmother 76  . Diabetes Paternal Grandfather   . Stroke Maternal Grandfather 37    ROS: Review of Systems  Constitutional: Negative for weight loss.  Gastrointestinal: Negative for nausea and vomiting.  Musculoskeletal:       Negative muscle weakness  Psychiatric/Behavioral: Positive for depression. Negative for suicidal ideas.    PHYSICAL EXAM: Blood pressure 120/87, pulse 77, temperature 98.7 F (37.1 C), temperature source Oral, height 5\' 8"  (1.727 m), weight 233 lb (105.7 kg), SpO2 97 %. Body mass index is 35.43 kg/m. Physical Exam  Constitutional: She is oriented to person, place, and time. [name].  Cardiovascular: Normal rate.  Pulmonary/Chest: Effort normal.  Musculoskeletal: Normal range of motion.  Neurological: She is oriented to person, place, and time.  Skin: Skin is warm and dry.  Psychiatric: She has a normal mood and affect.  Vitals reviewed.   RECENT LABS AND TESTS: BMET    Component Value Date/Time   NA 143 02/06/2017 0822   K 4.6 02/06/2017 0822   CL 105 02/06/2017 0822   CO2 24 02/06/2017 0822   GLUCOSE 91 02/06/2017 0822   GLUCOSE 92 08/16/2016 1011   BUN 16 02/06/2017 0822   CREATININE 0.76 02/06/2017 0822   CALCIUM 9.5 02/06/2017 0822   GFRNONAA 94 02/06/2017 0822   GFRAA 108 02/06/2017 0822   Lab Results  Component Value Date   HGBA1C 5.6 02/06/2017   HGBA1C 5.6 09/28/2016   HGBA1C 5.9  08/16/2016   HGBA1C 5.9 06/04/2015   HGBA1C 5.6 09/22/2014   Lab  Results  Component Value Date   INSULIN 28.1 (H) 02/06/2017   INSULIN 14.7 09/28/2016   CBC    Component Value Date/Time   WBC 6.6 08/16/2016 1011   RBC 4.68 08/16/2016 1011   HGB 13.2 08/16/2016 1011   HCT 39.4 08/16/2016 1011   PLT 235.0 08/16/2016 1011   MCV 84.2 08/16/2016 1011   MCH 27.0 04/19/2015 0650   MCHC 33.4 08/16/2016 1011   RDW 13.9 08/16/2016 1011   LYMPHSABS 1.8 08/16/2016 1011   MONOABS 0.6 08/16/2016 1011   EOSABS 0.2 08/16/2016 1011   BASOSABS 0.0 08/16/2016 1011   Iron/TIBC/Ferritin/ %Sat No results found for: IRON, TIBC, FERRITIN, IRONPCTSAT Lipid Panel     Component Value Date/Time   CHOL 153 02/06/2017 0822   TRIG 78 02/06/2017 0822   HDL 51 02/06/2017 0822   CHOLHDL 3 08/16/2016 1011   VLDL 12.2 08/16/2016 1011   LDLCALC 86 02/06/2017 0822   Hepatic Function Panel     Component Value Date/Time   PROT 6.8 02/06/2017 0822   ALBUMIN 4.1 02/06/2017 0822   AST 20 02/06/2017 0822   ALT 23 02/06/2017 0822   ALKPHOS 94 02/06/2017 0822   BILITOT 0.2 02/06/2017 0822   BILIDIR 0.1 06/12/2013 1632   IBILI 0.2 06/11/2009 2309      Component Value Date/Time   TSH 1.54 08/16/2016 1011   TSH 2.40 06/04/2015 0857   TSH 2.45 02/16/2015 1706    ASSESSMENT AND PLAN: Vitamin D deficiency - Plan: Vitamin D, Ergocalciferol, (DRISDOL) 50000 units CAPS capsule  Insulin resistance - Plan: metFORMIN (GLUCOPHAGE) 500 MG tablet  Other depression - with emotional eating - Plan: buPROPion (WELLBUTRIN SR) 200 MG 12 hr tablet  Class 2 severe obesity with serious comorbidity and body mass index (BMI) of 35.0 to 35.9 in adult, unspecified obesity type ([place])  PLAN:  Vitamin D Deficiency Raeghan was informed that low vitamin D levels contributes to fatigue and are associated with obesity, breast, and colon cancer. Layla agrees to continue taking prescription Vit D @50 ,000 IU every week #4 and  we will refill for 1 month. She will follow up for routine testing of vitamin D, at least 2-3 times per year. She was informed of the risk of over-replacement of vitamin D and agrees to not increase her dose unless he discusses this with [place] first. Kamyra agrees to follow up with our clinic in 3 weeks.  Insulin Resistance Jniyah will continue to work on weight loss, exercise, and decreasing simple carbohydrates in her diet to help decrease the risk of diabetes. We dicussed metformin including benefits and risks. She was informed that eating too many simple carbohydrates or too many calories at one sitting increases the likelihood of GI side effects. Avynn agrees to continue taking metformin 500 mg BID #60 and we will refill for 1 month. Masen agrees to follow up with our clinic in 3 weeks as directed to monitor her progress.  Depression with Emotional Eating Behaviors We discussed behavior modification techniques today to help Kala deal with her emotional eating and depression. Tallia agrees to continue taking Wellbutrin SR 200 mg qd #30 and we will refill for 1 month. Roquel agrees to follow up with our clinic in 3 weeks.  Obesity Delania is currently in the action stage of change. As such, her goal is to continue with weight loss efforts She has agreed to keep a food journal with 1200-1500 calories and 85+ grams of protein daily Judit has been instructed to work  up to a goal of 150 minutes of combined cardio and strengthening exercise per week for weight loss and overall health benefits. We discussed the following Behavioral Modification Strategies today: holiday eating strategies and travel eating strategies   Jenasia has agreed to follow up with our clinic in 3 weeks. She was informed of the importance of frequent follow up visits to maximize her success with intensive lifestyle modifications for her multiple health conditions.  I, Trixie Dredge, am acting as  transcriptionist for Dennard Nip, MD  I have reviewed the above documentation for accuracy and completeness, and I agree with the above. -Dennard Nip, MD     Today's visit was # 11 out of 22.  Starting weight: 241 lbs Starting date: 09/28/16 Today's weight : 233 lbs  Today's date: 03/21/2017 Total lbs lost to date: 8 (Patients must lose 7 lbs in the first 6 months to continue with counseling)   ASK: We discussed the diagnosis of obesity with Anne Ng today and Quiera agreed to give [place] permission to discuss obesity behavioral modification therapy today.  ASSESS: Kortlynn has the diagnosis of obesity and her BMI today is 35.44 Keshawna is in the action stage of change   ADVISE: Maritza was educated on the multiple health risks of obesity as well as the benefit of weight loss to improve her health. She was advised of the need for long term treatment and the importance of lifestyle modifications.  AGREE: Multiple dietary modification options and treatment options were discussed and  Corlene agreed to keep a food journal with 1200-1500 calories and 85+ grams of protein daily We discussed the following Behavioral Modification Strategies today: holiday eating strategies and travel eating strategies

## 2017-03-22 ENCOUNTER — Ambulatory Visit (INDEPENDENT_AMBULATORY_CARE_PROVIDER_SITE_OTHER): Payer: PPO | Admitting: Physician Assistant

## 2017-03-23 DIAGNOSIS — M6281 Muscle weakness (generalized): Secondary | ICD-10-CM | POA: Diagnosis not present

## 2017-03-23 DIAGNOSIS — M545 Low back pain: Secondary | ICD-10-CM | POA: Diagnosis not present

## 2017-03-23 DIAGNOSIS — M5417 Radiculopathy, lumbosacral region: Secondary | ICD-10-CM | POA: Diagnosis not present

## 2017-03-23 DIAGNOSIS — R262 Difficulty in walking, not elsewhere classified: Secondary | ICD-10-CM | POA: Diagnosis not present

## 2017-04-11 ENCOUNTER — Ambulatory Visit (INDEPENDENT_AMBULATORY_CARE_PROVIDER_SITE_OTHER): Payer: PPO | Admitting: Family Medicine

## 2017-04-11 VITALS — BP 116/76 | HR 97 | Temp 98.6°F | Ht 68.0 in | Wt 238.0 lb

## 2017-04-11 DIAGNOSIS — Z6836 Body mass index (BMI) 36.0-36.9, adult: Secondary | ICD-10-CM

## 2017-04-11 DIAGNOSIS — E559 Vitamin D deficiency, unspecified: Secondary | ICD-10-CM | POA: Diagnosis not present

## 2017-04-11 DIAGNOSIS — M533 Sacrococcygeal disorders, not elsewhere classified: Secondary | ICD-10-CM | POA: Diagnosis not present

## 2017-04-11 DIAGNOSIS — E8881 Metabolic syndrome: Secondary | ICD-10-CM | POA: Diagnosis not present

## 2017-04-11 DIAGNOSIS — Z9189 Other specified personal risk factors, not elsewhere classified: Secondary | ICD-10-CM

## 2017-04-11 DIAGNOSIS — F3289 Other specified depressive episodes: Secondary | ICD-10-CM

## 2017-04-11 MED ORDER — METFORMIN HCL 500 MG PO TABS: 500.0000 mg | ORAL_TABLET | Freq: Two times a day (BID) | ORAL | 0 refills | Status: DC

## 2017-04-11 MED ORDER — BUPROPION HCL ER (SR) 200 MG PO TB12: 200.0000 mg | ORAL_TABLET | Freq: Every day | ORAL | 0 refills | Status: DC

## 2017-04-11 MED ORDER — VITAMIN D (ERGOCALCIFEROL) 1.25 MG (50000 UNIT) PO CAPS: 50000.0000 [IU] | ORAL_CAPSULE | ORAL | 0 refills | Status: DC

## 2017-04-12 NOTE — Progress Notes (Signed)
Office: [phone]  /  Fax: [phone]   HPI:   Chief Complaint: OBESITY [name] is here to discuss her progress with her obesity treatment plan. She is on the keep a food journal with 1200-1500 calories and 85+ grams of protein daily and is following her eating plan approximately 75 % of the time. She states she is exercising 0 minutes 0 times per week. [name] struggled to maintain her weight over Christmas but states she is ready to get back on track. She isn't doing well with journaling and would like to go back to a structured plan.  Her weight is 238 lb (108 kg) today and has gained 5 pounds since her last visit. She has lost 3 lbs since starting treatment with [place].  Insulin Resistance [name] has a diagnosis of insulin resistance based on her elevated fasting insulin level >5. Although Tabbetha's blood glucose readings are still under good control, insulin resistance puts her at greater risk of metabolic syndrome and diabetes. She is doing well on metformin and she denies nausea, vomiting, or hypoglycemia. She is struggling to follow diet prescription.  At risk for diabetes [name] is at higher than average risk for developing diabetes due to her obesity and insulin resistance. She currently denies polyuria or polydipsia.  Vitamin D deficiency [name] has a diagnosis of vitamin D deficiency. She is on prescription Vit D, but not yet at goal. She denies nausea, vomiting or muscle weakness.  Depression with emotional eating behaviors [name]'s mood is stable on Wellbutrin but she has been eating more simple carbohydrates and feeling more out of control. She denies insomnia and her blood pressure is controlled. [name] struggles with emotional eating and using food for comfort to the extent that it is negatively impacting her health. She often snacks when she is not hungry. [name] sometimes feels she is out of control and then feels guilty that she made poor food choices. She  has been working on behavior modification techniques to help reduce her emotional eating and has been somewhat successful. She shows no sign of suicidal or homicidal ideations.  Depression screen [name] 2/9 09/28/2016 07/29/2015 09/22/2014  Decreased Interest 2 0 0  Down, Depressed, Hopeless 1 0 0  PHQ - 2 Score 3 0 0  Altered sleeping 1 - -  Tired, decreased energy 2 - -  Change in appetite 1 - -  Feeling bad or failure about yourself  1 - -  Trouble concentrating 2 - -  Moving slowly or fidgety/restless 1 - -  Suicidal thoughts 0 - -  PHQ-9 Score 11 - -  Some recent data might be hidden   ALLERGIES: Allergies  Allergen Reactions  . Chloraprep One Step [Chlorhexidine Gluconate] Rash    Developed severe rash where chloraprep was used on chest area  . Ivp Dye [Iodinated Diagnostic Agents] Rash and Other (See Comments)    Flushing, minor facial rash & dyspnea  . Nalbuphine Shortness Of Breath and Rash    Nubain caused respiratory distress & rash  . Septra [Sulfamethoxazole-Trimethoprim] Shortness Of Breath and Rash  . Sulfamethoxazole-Trimethoprim Rash    rash  . Tylox [Oxycodone-Acetaminophen] Rash    MEDICATIONS: Current Outpatient Medications on File Prior to Visit  Medication Sig Dispense Refill  . aspirin 81 MG tablet Take 81 mg by mouth daily.     [name] DOXEPIN HCL PO Take by mouth at bedtime.    . Fluticasone-Salmeterol (ADVAIR DISKUS) 100-50 MCG/DOSE AEPB Inhale 1 puff into the lungs 2 (two) times  daily. 60 each 5  . gabapentin (NEURONTIN) 100 MG capsule Take 2 capsules (200 mg total) by mouth at bedtime. (Patient taking differently: Take 100 mg by mouth at bedtime. ) 60 capsule 3  . hydrochlorothiazide (MICROZIDE) 12.5 MG capsule Take 1 capsule (12.5 mg total) by mouth daily. 90 capsule 2  . HYDROcodone-acetaminophen (NORCO/VICODIN) 5-325 MG tablet Take 1-2 tablets every 6 (six) hours as needed by mouth for moderate pain. 30 tablet 0  . lisinopril (PRINIVIL,ZESTRIL) 40 MG tablet  TAKE 1 TABLET EVERY DAY 90 tablet 2  . montelukast (SINGULAIR) 10 MG tablet Take 1 tablet (10 mg total) by mouth at bedtime. 90 tablet 3  . polyethylene glycol powder (GLYCOLAX/MIRALAX) powder Take 17 g by mouth daily. 3350 g 0   No current facility-administered medications on file prior to visit.     PAST MEDICAL HISTORY: Past Medical History:  Diagnosis Date  . Anemia   . Anxiety   . Arthritis    inflammitory  . Asthma   . B12 deficiency   . Back pain   . Chronic joint pain   . Constipation   . DDD (degenerative disc disease), lumbar    low back and neck and shoulders  . Depression    during divorce & legal matters  . Fatty liver   . Fibromyalgia   . Gallbladder problem   . GERD (gastroesophageal reflux disease)   . History of kidney stones    Dr Phebe Colla  . History of polycystic ovarian disease S/P BSO  . Hx of anxiety disorder   . Hypertension    a  . Hypothyroidism    no meds  . IBS (irritable bowel syndrome)   . IBS (irritable bowel syndrome)   . Infertility, female   . Joint pain   . Kidney problem   . Lactose intolerance   . Lupus hx positive ANA   followed by Dr Gavin Pound; possible lupus  . Multiple food allergies   . Myalgia   . Osteoarthritis   . 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 POLYCYSTIC OVARIAN DISEASE 03/02/2007   Qualifier: Diagnosis of  By: Linna Darner MD, Rae Mar BSO for cysts & endometriosis; Dr Reino Kent, Gyn Seeing Dr Paula Compton    . PONV (postoperative nausea and vomiting)   . Prediabetes   . Sweating profusely   . Symptomatic mammary hypertrophy   . URI (upper respiratory infection)    currently on cefdinir    PAST SURGICAL HISTORY: Past Surgical History:  Procedure Laterality Date  . [place]   exploratory lap  . BREAST REDUCTION SURGERY Bilateral 03/26/2015   Procedure: BILATERAL BREAST REDUCTION ;  Surgeon: Wallace Going, DO;  Location: [facility];  Service:  Plastics;  Laterality: Bilateral;  . CARPAL TUNNEL RELEASE     bilateral; Dr Sherwood Gambler  . COLONOSCOPY     in 1990s  . CYSTO/ RIGHT RETROGRADE URETERAL PYELOGRAM  07-09-2004   HX BILATERAL RENAL STONES/ RIGHT FLANK PAIN  . CYSTOSCOPY W/ URETERAL STENT PLACEMENT  11/12/2011   Procedure: CYSTOSCOPY WITH RETROGRADE PYELOGRAM/URETERAL STENT PLACEMENT;  Surgeon: Alexis Frock, MD;  Location: [facility];  Service: Urology;  Laterality: Right;  . CYSTOSCOPY W/ URETERAL STENT REMOVAL  12/07/2011   Procedure: CYSTOSCOPY WITH STENT REMOVAL;  Surgeon: Alexis Frock, MD;  Location: [facility];  Service: Urology;  Laterality: Right;  . CYSTOSCOPY/RETROGRADE/URETEROSCOPY/STONE EXTRACTION WITH BASKET  12/07/2011  Procedure: CYSTOSCOPY/RETROGRADE/URETEROSCOPY/STONE EXTRACTION WITH BASKET;  Surgeon: Alexis Frock, MD;  Location: [facility];  Service: Urology;  Laterality: Right;  . DILATION AND CURETTAGE OF UTERUS     X5  . EXCISION LEFT BARTHOLIN GLAND  02-05-2002  . EYE SURGERY     Retinal tears surgery bilateral  . LAPAROSCOPIC ASSISTED VAGINAL HYSTERECTOMY  2000  . LAPAROSCOPIC CHOLECYSTECTOMY  05-31-2007  . LAPAROSCOPIC LASER ABLATION ENDOMETRIOSIS AND LEFT SALPINGO-OOPHORECTOMY  11-03-1999  . LAPAROSCOPY WITH RIGHT SALPINGO-OOPHECTOMY/ LYSIS ADHESIONS AND ABLATION ENDOMETRIOSIS  05-17-2001  . LIPOSUCTION Bilateral 03/26/2015   Procedure: WITH LIPOSUCTION;  Surgeon: Wallace Going, DO;  Location: [facility];  Service: Plastics;  Laterality: Bilateral;  . LUMBAR LAMINECTOMY  2003   L4 - L5  . PLANTAR FASCIA SURGERY     right  . PLANTAR FASCIA SURGERY  02/2011   left  . RIGHT URETEROSCOPIC STONE EXTRACTION  08-04-2000  . TONSILLECTOMY    . URETERAL STENT PLACEMENT  11/12/11   Dr Tresa Moore    SOCIAL HISTORY: Social History   Tobacco Use  . Smoking status: Never Smoker  . Smokeless tobacco: Never Used  Substance Use Topics  . Alcohol use: Yes     Alcohol/week: 0.6 oz    Types: 1 Shots of liquor per week    Comment:  very rarely  . Drug use: No    FAMILY HISTORY: Family History  Problem Relation Age of Onset  . Hypertension Mother   . Colon polyps Mother   . Atrial fibrillation Mother        Dr Angelena Form  . Hyperlipidemia Mother   . Heart disease Mother   . Anxiety disorder Mother   . Sleep apnea Mother   . Obesity Mother   . Heart attack Father 49  . Diabetes Father   . Hypertension Father   . Hyperlipidemia Father   . Heart disease Father   . Stroke Father   . Obesity Father   . Hypertension Sister   . Heart attack Paternal Grandmother 69  . Breast cancer Paternal Aunt   . Colon cancer Maternal Uncle   . Colon cancer Paternal Uncle   . Colon polyps Maternal Grandmother   . Stroke Maternal Grandmother 76  . Diabetes Paternal Grandfather   . Stroke Maternal Grandfather 37    ROS: Review of Systems  Constitutional: Negative for weight loss.  Gastrointestinal: Negative for nausea and vomiting.  Genitourinary: Negative for frequency.  Musculoskeletal:       Negative muscle weakness  Endo/Heme/Allergies: Negative for polydipsia.       Negative hypoglycemia  Psychiatric/Behavioral: Positive for depression. Negative for suicidal ideas. The patient does not have insomnia.     PHYSICAL EXAM: Blood pressure 116/76, pulse 97, temperature 98.6 F (37 C), temperature source Oral, height 5\' 8"  (1.727 m), weight 238 lb (108 kg), SpO2 99 %. Body mass index is 36.19 kg/m. Physical Exam  Constitutional: She is oriented to person, place, and time. [name].  Cardiovascular: Normal rate.  Pulmonary/Chest: Effort normal.  Musculoskeletal: Normal range of motion.  Neurological: She is oriented to person, place, and time.  Skin: Skin is warm and dry.  Psychiatric: She has a normal mood and affect.  Vitals reviewed.   RECENT LABS AND TESTS: BMET    Component Value Date/Time   NA  143 02/06/2017 0822   K 4.6 02/06/2017 0822   CL 105 02/06/2017 0822   CO2 24 02/06/2017 0822   GLUCOSE  91 02/06/2017 0822   GLUCOSE 92 08/16/2016 1011   BUN 16 02/06/2017 0822   CREATININE 0.76 02/06/2017 0822   CALCIUM 9.5 02/06/2017 0822   GFRNONAA 94 02/06/2017 0822   GFRAA 108 02/06/2017 0822   Lab Results  Component Value Date   HGBA1C 5.6 02/06/2017   HGBA1C 5.6 09/28/2016   HGBA1C 5.9 08/16/2016   HGBA1C 5.9 06/04/2015   HGBA1C 5.6 09/22/2014   Lab Results  Component Value Date   INSULIN 28.1 (H) 02/06/2017   INSULIN 14.7 09/28/2016   CBC    Component Value Date/Time   WBC 6.6 08/16/2016 1011   RBC 4.68 08/16/2016 1011   HGB 13.2 08/16/2016 1011   HCT 39.4 08/16/2016 1011   PLT 235.0 08/16/2016 1011   MCV 84.2 08/16/2016 1011   MCH 27.0 04/19/2015 0650   MCHC 33.4 08/16/2016 1011   RDW 13.9 08/16/2016 1011   LYMPHSABS 1.8 08/16/2016 1011   MONOABS 0.6 08/16/2016 1011   EOSABS 0.2 08/16/2016 1011   BASOSABS 0.0 08/16/2016 1011   Iron/TIBC/Ferritin/ %Sat No results found for: IRON, TIBC, FERRITIN, IRONPCTSAT Lipid Panel     Component Value Date/Time   CHOL 153 02/06/2017 0822   TRIG 78 02/06/2017 0822   HDL 51 02/06/2017 0822   CHOLHDL 3 08/16/2016 1011   VLDL 12.2 08/16/2016 1011   LDLCALC 86 02/06/2017 0822   Hepatic Function Panel     Component Value Date/Time   PROT 6.8 02/06/2017 0822   ALBUMIN 4.1 02/06/2017 0822   AST 20 02/06/2017 0822   ALT 23 02/06/2017 0822   ALKPHOS 94 02/06/2017 0822   BILITOT 0.2 02/06/2017 0822   BILIDIR 0.1 06/12/2013 1632   IBILI 0.2 06/11/2009 2309      Component Value Date/Time   TSH 1.54 08/16/2016 1011   TSH 2.40 06/04/2015 0857   TSH 2.45 02/16/2015 1706  Results for CHANTEE, [name] (MRN [id number]) as of 04/12/2017 12:22  Ref. Range 02/06/2017 08:22  Vitamin D, 25-Hydroxy Latest Ref Range: 30.0 - 100.0 [name]/mL 43.7    ASSESSMENT AND PLAN: Insulin resistance - Plan: metFORMIN (GLUCOPHAGE) 500 MG  tablet  Vitamin D deficiency - Plan: Vitamin D, Ergocalciferol, (DRISDOL) 50000 units CAPS capsule  Other depression - with emotional eating - Plan: buPROPion (WELLBUTRIN SR) 200 MG 12 hr tablet  At risk for diabetes mellitus  Class 2 severe obesity with serious comorbidity and body mass index (BMI) of 36.0 to 36.9 in adult, unspecified obesity type ([place])  PLAN:  Insulin Resistance [name] will continue to work on weight loss, exercise, and decreasing simple carbohydrates in her diet to help decrease the risk of diabetes. We dicussed metformin including benefits and risks. She was informed that eating too many simple carbohydrates or too many calories at one sitting increases the likelihood of GI side effects. Carron agrees to continue taking metformin 500 mg BID #60 and we will refill for 1 month . [name] agrees to follow up with our clinic in 3 weeks as directed to monitor her progress.  Diabetes risk counselling [name] was given extended (15 minutes) diabetes prevention counseling today. She is 47 y.o. female and has risk factors for diabetes including obesity and insulin resistance. We discussed intensive lifestyle modifications today with an emphasis on weight loss as well as increasing exercise and decreasing simple carbohydrates in her diet.  Vitamin D Deficiency [name] was informed that low vitamin D levels contributes to fatigue and are associated with obesity, breast, and colon cancer. [name] agrees to  continue taking prescription Vit D @50 ,000 IU every week #4 and we will refill for 1 month. She will follow up for routine testing of vitamin D, at least 2-3 times per year. She was informed of the risk of over-replacement of vitamin D and agrees to not increase her dose unless he discusses this with [place] first. [name] agrees to follow up with our clinic in 3 weeks.  Depression with Emotional Eating Behaviors We discussed behavior modification techniques today to help  [name] deal with her emotional eating and depression. [name] agrees to continue taking Wellbutrin SR 200 mg qd #30 and we will refill for 1 month. [name] agrees to follow up with our clinic in 3 weeks.  Obesity [name] is currently in the action stage of change. As such, her goal is to continue with weight loss efforts She has agreed to change to follow the Category 2 plan + 100 calories [name] has been instructed to work up to a goal of 150 minutes of combined cardio and strengthening exercise per week for weight loss and overall health benefits. We discussed the following Behavioral Modification Strategies today: increasing lean protein intake, decreasing simple carbohydrates  and work on meal planning and easy cooking plans   [name] has agreed to follow up with our clinic in 3 weeks. She was informed of the importance of frequent follow up visits to maximize her success with intensive lifestyle modifications for her multiple health conditions.   OBESITY BEHAVIORAL INTERVENTION VISIT  Today's visit was # 12 out of 22.  Starting weight: 241 lbs Starting date: 09/28/16 Today's weight : 238 lbs  Today's date: 04/11/2017 Total lbs lost to date: 3 (Patients must lose 7 lbs in the first 6 months to continue with counseling)   ASK: We discussed the diagnosis of obesity with [name] today and [name] agreed to give [place] permission to discuss obesity behavioral modification therapy today.  ASSESS: [name] has the diagnosis of obesity and her BMI today is 36.2 [name] is in the action stage of change   ADVISE: [name] was educated on the multiple health risks of obesity as well as the benefit of weight loss to improve her health. She was advised of the need for long term treatment and the importance of lifestyle modifications.  AGREE: Multiple dietary modification options and treatment options were discussed and  [name] agreed to the above obesity treatment  plan.  I, Trixie Dredge, am acting as transcriptionist for Dennard Nip, MD  I have reviewed the above documentation for accuracy and completeness, and I agree with the above. -Dennard Nip, MD

## 2017-04-13 DIAGNOSIS — M545 Low back pain: Secondary | ICD-10-CM | POA: Diagnosis not present

## 2017-04-13 DIAGNOSIS — R262 Difficulty in walking, not elsewhere classified: Secondary | ICD-10-CM | POA: Diagnosis not present

## 2017-04-13 DIAGNOSIS — M5417 Radiculopathy, lumbosacral region: Secondary | ICD-10-CM | POA: Diagnosis not present

## 2017-04-13 DIAGNOSIS — M6281 Muscle weakness (generalized): Secondary | ICD-10-CM | POA: Diagnosis not present

## 2017-04-25 DIAGNOSIS — M5417 Radiculopathy, lumbosacral region: Secondary | ICD-10-CM | POA: Diagnosis not present

## 2017-04-25 DIAGNOSIS — M6281 Muscle weakness (generalized): Secondary | ICD-10-CM | POA: Diagnosis not present

## 2017-04-25 DIAGNOSIS — R262 Difficulty in walking, not elsewhere classified: Secondary | ICD-10-CM | POA: Diagnosis not present

## 2017-04-25 DIAGNOSIS — M545 Low back pain: Secondary | ICD-10-CM | POA: Diagnosis not present

## 2017-05-02 ENCOUNTER — Ambulatory Visit (INDEPENDENT_AMBULATORY_CARE_PROVIDER_SITE_OTHER): Payer: PPO | Admitting: Family Medicine

## 2017-05-02 VITALS — BP 123/74 | HR 75 | Temp 99.6°F | Ht 68.0 in | Wt 235.0 lb

## 2017-05-02 DIAGNOSIS — Z6835 Body mass index (BMI) 35.0-35.9, adult: Secondary | ICD-10-CM

## 2017-05-02 DIAGNOSIS — R262 Difficulty in walking, not elsewhere classified: Secondary | ICD-10-CM | POA: Diagnosis not present

## 2017-05-02 DIAGNOSIS — E8881 Metabolic syndrome: Secondary | ICD-10-CM

## 2017-05-02 DIAGNOSIS — M6281 Muscle weakness (generalized): Secondary | ICD-10-CM | POA: Diagnosis not present

## 2017-05-02 DIAGNOSIS — M5417 Radiculopathy, lumbosacral region: Secondary | ICD-10-CM | POA: Diagnosis not present

## 2017-05-02 DIAGNOSIS — M545 Low back pain: Secondary | ICD-10-CM | POA: Diagnosis not present

## 2017-05-02 NOTE — Progress Notes (Signed)
Office: [phone]  /  Fax: [phone]   HPI:   Chief Complaint: OBESITY [name] is here to discuss her progress with her obesity treatment plan. She is on the Category 2 plan + 100 calories and is following her eating plan approximately 50 % of the time. She states she is painting and walking. [name] did well with weight loss even when going on a cruise. She increased steps to 20,000 per day but did indulge in high calorie drinks. (1,000 calories per day in drinks).  Her weight is 235 lb (106.6 kg) today and has had a weight loss of 3 pounds over a period of 2 weeks since her last visit. She has lost 6 lbs since starting treatment with [name].  Insulin Resistance [name] has a diagnosis of insulin resistance based on her elevated fasting insulin level >5. Although [name]'s blood glucose readings are still under good control, insulin resistance puts her at greater risk of metabolic syndrome and diabetes. She is on metformin and working on diet but has drank a lot more calories recently. She denies nausea, vomiting, and hypoglycemia.  ALLERGIES: Allergies  Allergen Reactions  . Chloraprep One Step [Chlorhexidine Gluconate] Rash    Developed severe rash where chloraprep was used on chest area  . Ivp Dye [Iodinated Diagnostic Agents] Rash and Other (See Comments)    Flushing, minor facial rash & dyspnea  . Nalbuphine Shortness Of Breath and Rash    Nubain caused respiratory distress & rash  . Septra [Sulfamethoxazole-Trimethoprim] Shortness Of Breath and Rash  . Sulfamethoxazole-Trimethoprim Rash    rash  . Tylox [Oxycodone-Acetaminophen] Rash    MEDICATIONS: Current Outpatient Medications on File Prior to Visit  Medication Sig Dispense Refill  . aspirin 81 MG tablet Take 81 mg by mouth daily.     [name] buPROPion (WELLBUTRIN SR) 200 MG 12 hr tablet Take 1 tablet (200 mg total) by mouth daily. 30 tablet 0  . DOXEPIN HCL PO Take by mouth at bedtime.    . Fluticasone-Salmeterol  (ADVAIR DISKUS) 100-50 MCG/DOSE AEPB Inhale 1 puff into the lungs 2 (two) times daily. 60 each 5  . gabapentin (NEURONTIN) 100 MG capsule Take 2 capsules (200 mg total) by mouth at bedtime. (Patient taking differently: Take 100 mg by mouth at bedtime. ) 60 capsule 3  . hydrochlorothiazide (MICROZIDE) 12.5 MG capsule Take 1 capsule (12.5 mg total) by mouth daily. 90 capsule 2  . HYDROcodone-acetaminophen (NORCO/VICODIN) 5-325 MG tablet Take 1-2 tablets every 6 (six) hours as needed by mouth for moderate pain. 30 tablet 0  . lisinopril (PRINIVIL,ZESTRIL) 40 MG tablet TAKE 1 TABLET EVERY DAY 90 tablet 2  . metFORMIN (GLUCOPHAGE) 500 MG tablet Take 1 tablet (500 mg total) by mouth 2 (two) times daily. 60 tablet 0  . montelukast (SINGULAIR) 10 MG tablet Take 1 tablet (10 mg total) by mouth at bedtime. 90 tablet 3  . polyethylene glycol powder (GLYCOLAX/MIRALAX) powder Take 17 g by mouth daily. 3350 g 0  . Vitamin D, Ergocalciferol, (DRISDOL) 50000 units CAPS capsule Take 1 capsule (50,000 Units total) by mouth every 7 (seven) days. 4 capsule 0   No current facility-administered medications on file prior to visit.     PAST MEDICAL HISTORY: Past Medical History:  Diagnosis Date  . Anemia   . Anxiety   . Arthritis    inflammitory  . Asthma   . B12 deficiency   . Back pain   . Chronic joint pain   . Constipation   .  DDD (degenerative disc disease), lumbar    low back and neck and shoulders  . Depression    during divorce & legal matters  . Fatty liver   . Fibromyalgia   . Gallbladder problem   . GERD (gastroesophageal reflux disease)   . History of kidney stones    Dr Phebe Colla  . History of polycystic ovarian disease S/P BSO  . Hx of anxiety disorder   . Hypertension    a  . Hypothyroidism    no meds  . IBS (irritable bowel syndrome)   . IBS (irritable bowel syndrome)   . Infertility, female   . Joint pain   . Kidney problem   . Lactose intolerance   . Lupus hx positive ANA    followed by Dr Gavin Pound; possible lupus  . Multiple food allergies   . Myalgia   . Osteoarthritis   . 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 POLYCYSTIC OVARIAN DISEASE 03/02/2007   Qualifier: Diagnosis of  By: Linna Darner MD, Rae Mar BSO for cysts & endometriosis; Dr Reino Kent, Gyn Seeing Dr Paula Compton    . PONV (postoperative nausea and vomiting)   . Prediabetes   . Sweating profusely   . Symptomatic mammary hypertrophy   . URI (upper respiratory infection)    currently on cefdinir    PAST SURGICAL HISTORY: Past Surgical History:  Procedure Laterality Date  . [place]   exploratory lap  . BREAST REDUCTION SURGERY Bilateral 03/26/2015   Procedure: BILATERAL BREAST REDUCTION ;  Surgeon: Wallace Going, DO;  Location: [facility];  Service: Plastics;  Laterality: Bilateral;  . CARPAL TUNNEL RELEASE     bilateral; Dr Sherwood Gambler  . COLONOSCOPY     in 1990s  . CYSTO/ RIGHT RETROGRADE URETERAL PYELOGRAM  07-09-2004   HX BILATERAL RENAL STONES/ RIGHT FLANK PAIN  . CYSTOSCOPY W/ URETERAL STENT PLACEMENT  11/12/2011   Procedure: CYSTOSCOPY WITH RETROGRADE PYELOGRAM/URETERAL STENT PLACEMENT;  Surgeon: Alexis Frock, MD;  Location: [facility];  Service: Urology;  Laterality: Right;  . CYSTOSCOPY W/ URETERAL STENT REMOVAL  12/07/2011   Procedure: CYSTOSCOPY WITH STENT REMOVAL;  Surgeon: Alexis Frock, MD;  Location: [facility];  Service: Urology;  Laterality: Right;  . CYSTOSCOPY/RETROGRADE/URETEROSCOPY/STONE EXTRACTION WITH BASKET  12/07/2011   Procedure: CYSTOSCOPY/RETROGRADE/URETEROSCOPY/STONE EXTRACTION WITH BASKET;  Surgeon: Alexis Frock, MD;  Location[facility];  Service: Urology;  Laterality: Right;  . DILATION AND CURETTAGE OF UTERUS     X5  . EXCISION LEFT BARTHOLIN GLAND  02-05-2002  . EYE SURGERY     Retinal tears surgery bilateral  . LAPAROSCOPIC ASSISTED VAGINAL HYSTERECTOMY  2000  .  LAPAROSCOPIC CHOLECYSTECTOMY  05-31-2007  . LAPAROSCOPIC LASER ABLATION ENDOMETRIOSIS AND LEFT SALPINGO-OOPHORECTOMY  11-03-1999  . LAPAROSCOPY WITH RIGHT SALPINGO-OOPHECTOMY/ LYSIS ADHESIONS AND ABLATION ENDOMETRIOSIS  05-17-2001  . LIPOSUCTION Bilateral 03/26/2015   Procedure: WITH LIPOSUCTION;  Surgeon: Wallace Going, DO;  Location: [place] [facility];  Service: Plastics;  Laterality: Bilateral;  . LUMBAR LAMINECTOMY  2003   L4 - L5  . PLANTAR FASCIA SURGERY     right  . PLANTAR FASCIA SURGERY  02/2011   left  . RIGHT URETEROSCOPIC STONE EXTRACTION  08-04-2000  . TONSILLECTOMY    . URETERAL STENT PLACEMENT  11/12/11   Dr Tresa Moore    SOCIAL HISTORY: Social History   Tobacco Use  . Smoking status: Never Smoker  .  Smokeless tobacco: Never Used  Substance Use Topics  . Alcohol use: Yes    Alcohol/week: 0.6 oz    Types: 1 Shots of liquor per week    Comment:  very rarely  . Drug use: No    FAMILY HISTORY: Family History  Problem Relation Age of Onset  . Hypertension Mother   . Colon polyps Mother   . Atrial fibrillation Mother        Dr Angelena Form  . Hyperlipidemia Mother   . Heart disease Mother   . Anxiety disorder Mother   . Sleep apnea Mother   . Obesity Mother   . Heart attack Father 80  . Diabetes Father   . Hypertension Father   . Hyperlipidemia Father   . Heart disease Father   . Stroke Father   . Obesity Father   . Hypertension Sister   . Heart attack Paternal Grandmother 54  . Breast cancer Paternal Aunt   . Colon cancer Maternal Uncle   . Colon cancer Paternal Uncle   . Colon polyps Maternal Grandmother   . Stroke Maternal Grandmother 76  . Diabetes Paternal Grandfather   . Stroke Maternal Grandfather 37    ROS: Review of Systems  Constitutional: Positive for weight loss.  Gastrointestinal: Negative for nausea and vomiting.  Endo/Heme/Allergies:       Negative hypoglycemia    PHYSICAL EXAM: Blood pressure 123/74, pulse 75,  temperature 99.6 F (37.6 C), temperature source Oral, height 5\' 8"  (1.727 m), weight 235 lb (106.6 kg), SpO2 99 %. Body mass index is 35.73 kg/m. Physical Exam  Constitutional: She is oriented to person, place, and time. [name].  Cardiovascular: Normal rate.  Pulmonary/Chest: Effort normal.  Musculoskeletal: Normal range of motion.  Neurological: She is oriented to person, place, and time.  Skin: Skin is warm and dry.  Psychiatric: She has a normal mood and affect. Her behavior is normal.  Vitals reviewed.   RECENT LABS AND TESTS: BMET    Component Value Date/Time   NA 143 02/06/2017 0822   K 4.6 02/06/2017 0822   CL 105 02/06/2017 0822   CO2 24 02/06/2017 0822   GLUCOSE 91 02/06/2017 0822   GLUCOSE 92 08/16/2016 1011   BUN 16 02/06/2017 0822   CREATININE 0.76 02/06/2017 0822   CALCIUM 9.5 02/06/2017 0822   GFRNONAA 94 02/06/2017 0822   GFRAA 108 02/06/2017 0822   Lab Results  Component Value Date   HGBA1C 5.6 02/06/2017   HGBA1C 5.6 09/28/2016   HGBA1C 5.9 08/16/2016   HGBA1C 5.9 06/04/2015   HGBA1C 5.6 09/22/2014   Lab Results  Component Value Date   INSULIN 28.1 (H) 02/06/2017   INSULIN 14.7 09/28/2016   CBC    Component Value Date/Time   WBC 6.6 08/16/2016 1011   RBC 4.68 08/16/2016 1011   HGB 13.2 08/16/2016 1011   HCT 39.4 08/16/2016 1011   PLT 235.0 08/16/2016 1011   MCV 84.2 08/16/2016 1011   MCH 27.0 04/19/2015 0650   MCHC 33.4 08/16/2016 1011   RDW 13.9 08/16/2016 1011   LYMPHSABS 1.8 08/16/2016 1011   MONOABS 0.6 08/16/2016 1011   EOSABS 0.2 08/16/2016 1011   BASOSABS 0.0 08/16/2016 1011   Iron/TIBC/Ferritin/ %Sat No results found for: IRON, TIBC, FERRITIN, IRONPCTSAT Lipid Panel     Component Value Date/Time   CHOL 153 02/06/2017 0822   TRIG 78 02/06/2017 0822   HDL 51 02/06/2017 0822   CHOLHDL 3 08/16/2016 1011   VLDL  12.2 08/16/2016 1011   LDLCALC 86 02/06/2017 0822   Hepatic Function Panel       Component Value Date/Time   PROT 6.8 02/06/2017 0822   ALBUMIN 4.1 02/06/2017 0822   AST 20 02/06/2017 0822   ALT 23 02/06/2017 0822   ALKPHOS 94 02/06/2017 0822   BILITOT 0.2 02/06/2017 0822   BILIDIR 0.1 06/12/2013 1632   IBILI 0.2 06/11/2009 2309      Component Value Date/Time   TSH 1.54 08/16/2016 1011   TSH 2.40 06/04/2015 0857   TSH 2.45 02/16/2015 1706    ASSESSMENT AND PLAN: Insulin resistance  Class 2 severe obesity with serious comorbidity and body mass index (BMI) of 35.0 to 35.9 in adult, unspecified obesity type ([place])  PLAN:  Insulin Resistance [name] will continue to work on weight loss, diet, exercise, and decreasing simple carbohydrates in her diet to help decrease the risk of diabetes. We dicussed metformin including benefits and risks. She was informed that eating too many simple carbohydrates or too many calories at one sitting increases the likelihood of GI side effects. [name] agrees to continue metformin as prescribed and will go back to not drinking liquid calories. We will recheck labs in 1 month and [name] agrees to follow up with our clinic in 2 weeks as directed to monitor her progress.  We spent > than 50% of the 15 minute visit on the counseling as documented in the note.  Obesity [name] is currently in the action stage of change. As such, her goal is to continue with weight loss efforts She has agreed to keep a food journal with 1200-1600 calories and 85+ grams of protein daily [name] has been instructed to work up to a goal of 150 minutes of combined cardio and strengthening exercise per week for weight loss and overall health benefits. We discussed the following Behavioral Modification Strategies today: increasing lean protein intake and work on meal planning and easy cooking plans   [name] has agreed to follow up with our clinic in 2 weeks. She was informed of the importance of frequent follow up visits to maximize her success  with intensive lifestyle modifications for her multiple health conditions.   OBESITY BEHAVIORAL INTERVENTION VISIT  Today's visit was # 13 out of 22.  Starting weight: 241 lbs Starting date: 09/28/16 Today's weight : 235 lbs  Today's date: 05/02/2017 Total lbs lost to date: 6 (Patients must lose 7 lbs in the first 6 months to continue with counseling)   ASK: We discussed the diagnosis of obesity with [name] today and [name] agreed to give [name] permission to discuss obesity behavioral modification therapy today.  ASSESS: [name] has the diagnosis of obesity and her BMI today is 35.74 [name] is in the action stage of change   ADVISE: [name] was educated on the multiple health risks of obesity as well as the benefit of weight loss to improve her health. She was advised of the need for long term treatment and the importance of lifestyle modifications.  AGREE: Multiple dietary modification options and treatment options were discussed and  [name] agreed to the above obesity treatment plan.  I, [name], am acting as transcriptionist for Dennard Nip, MD  I have reviewed the above documentation for accuracy and completeness, and I agree with the above. -Dennard Nip, MD

## 2017-05-10 DIAGNOSIS — M25511 Pain in right shoulder: Secondary | ICD-10-CM | POA: Diagnosis not present

## 2017-05-10 DIAGNOSIS — M533 Sacrococcygeal disorders, not elsewhere classified: Secondary | ICD-10-CM | POA: Diagnosis not present

## 2017-05-15 DIAGNOSIS — H59813 Chorioretinal scars after surgery for detachment, bilateral: Secondary | ICD-10-CM | POA: Diagnosis not present

## 2017-05-16 ENCOUNTER — Ambulatory Visit (INDEPENDENT_AMBULATORY_CARE_PROVIDER_SITE_OTHER): Payer: PPO | Admitting: Family Medicine

## 2017-05-16 VITALS — BP 111/66 | HR 77 | Temp 98.7°F | Ht 68.0 in | Wt 233.0 lb

## 2017-05-16 DIAGNOSIS — F3289 Other specified depressive episodes: Secondary | ICD-10-CM

## 2017-05-16 DIAGNOSIS — F418 Other specified anxiety disorders: Secondary | ICD-10-CM | POA: Insufficient documentation

## 2017-05-16 DIAGNOSIS — Z6835 Body mass index (BMI) 35.0-35.9, adult: Secondary | ICD-10-CM

## 2017-05-16 DIAGNOSIS — R7303 Prediabetes: Secondary | ICD-10-CM | POA: Diagnosis not present

## 2017-05-16 DIAGNOSIS — F329 Major depressive disorder, single episode, unspecified: Secondary | ICD-10-CM | POA: Insufficient documentation

## 2017-05-16 DIAGNOSIS — F32A Depression, unspecified: Secondary | ICD-10-CM | POA: Insufficient documentation

## 2017-05-16 MED ORDER — BUPROPION HCL ER (SR) 150 MG PO TB12: 150.0000 mg | ORAL_TABLET | Freq: Two times a day (BID) | ORAL | 0 refills | Status: DC

## 2017-05-16 MED ORDER — METFORMIN HCL 500 MG PO TABS: 500.0000 mg | ORAL_TABLET | Freq: Two times a day (BID) | ORAL | 0 refills | Status: DC

## 2017-05-16 NOTE — Progress Notes (Signed)
Office: [phone]  /  Fax: [phone]   HPI:   Chief Complaint: OBESITY [name] is here to discuss her progress with her obesity treatment plan. She is on the keep a food journal with 1200-1600 calories and 85+ grams of protein daily and is following her eating plan approximately 75 % of the time. She states she is walking for 30 minutes 5 times per week. [name] has done better with weight loss with journaling. She has been sick in the last week and not eating as much.  Her weight is 233 lb (105.7 kg) today and has had a weight loss of 2 pounds over a period of 2 weeks since her last visit. She has lost 8 lbs since starting treatment with [place].  Pre-Diabetes [name] has a diagnosis of pre-diabetes based on her elevated Hgb A1c and was informed this puts her at greater risk of developing diabetes. She is stable on metformin and denies nausea,vomiting, or hypoglycemia. She is working on diet prescription and notes decreased polyphagia.  Depression with emotional eating behaviors [name] is on Wellbutrin SR 200 mg but still struggling with emotional eating and fatigue and using food for comfort to the extent that it is negatively impacting her health. She often snacks when she is not hungry. [name] sometimes feels she is out of control and then feels guilty that she made poor food choices. She has been working on behavior modification techniques to help reduce her emotional eating and has been somewhat successful. She shows no sign of suicidal or homicidal ideations.  Depression screen [name] 2/9 09/28/2016 07/29/2015 09/22/2014  Decreased Interest 2 0 0  Down, Depressed, Hopeless 1 0 0  PHQ - 2 Score 3 0 0  Altered sleeping 1 - -  Tired, decreased energy 2 - -  Change in appetite 1 - -  Feeling bad or failure about yourself  1 - -  Trouble concentrating 2 - -  Moving slowly or fidgety/restless 1 - -  Suicidal thoughts 0 - -  PHQ-9 Score 11 - -  Some recent data might be hidden    ALLERGIES: Allergies  Allergen Reactions  . Chloraprep One Step [Chlorhexidine Gluconate] Rash    Developed severe rash where chloraprep was used on chest area  . Ivp Dye [Iodinated Diagnostic Agents] Rash and Other (See Comments)    Flushing, minor facial rash & dyspnea  . Nalbuphine Shortness Of Breath and Rash    Nubain caused respiratory distress & rash  . Septra [Sulfamethoxazole-Trimethoprim] Shortness Of Breath and Rash  . Sulfamethoxazole-Trimethoprim Rash    rash  . Tylox [Oxycodone-Acetaminophen] Rash    MEDICATIONS: Current Outpatient Medications on File Prior to Visit  Medication Sig Dispense Refill  . aspirin 81 MG tablet Take 81 mg by mouth daily.     [name] DOXEPIN HCL PO Take by mouth at bedtime.    . Fluticasone-Salmeterol (ADVAIR DISKUS) 100-50 MCG/DOSE AEPB Inhale 1 puff into the lungs 2 (two) times daily. 60 each 5  . gabapentin (NEURONTIN) 100 MG capsule Take 2 capsules (200 mg total) by mouth at bedtime. (Patient taking differently: Take 100 mg by mouth at bedtime. ) 60 capsule 3  . hydrochlorothiazide (MICROZIDE) 12.5 MG capsule Take 1 capsule (12.5 mg total) by mouth daily. 90 capsule 2  . HYDROcodone-acetaminophen (NORCO/VICODIN) 5-325 MG tablet Take 1-2 tablets every 6 (six) hours as needed by mouth for moderate pain. 30 tablet 0  . lisinopril (PRINIVIL,ZESTRIL) 40 MG tablet TAKE 1 TABLET EVERY DAY 90 tablet  2  . metFORMIN (GLUCOPHAGE) 500 MG tablet Take 1 tablet (500 mg total) by mouth 2 (two) times daily. 60 tablet 0  . montelukast (SINGULAIR) 10 MG tablet Take 1 tablet (10 mg total) by mouth at bedtime. 90 tablet 3  . polyethylene glycol powder (GLYCOLAX/MIRALAX) powder Take 17 g by mouth daily. 3350 g 0  . Vitamin D, Ergocalciferol, (DRISDOL) 50000 units CAPS capsule Take 1 capsule (50,000 Units total) by mouth every 7 (seven) days. 4 capsule 0   No current facility-administered medications on file prior to visit.     PAST MEDICAL HISTORY: Past Medical  History:  Diagnosis Date  . Anemia   . Anxiety   . Arthritis    inflammitory  . Asthma   . B12 deficiency   . Back pain   . Chronic joint pain   . Constipation   . DDD (degenerative disc disease), lumbar    low back and neck and shoulders  . Depression    during divorce & legal matters  . Fatty liver   . Fibromyalgia   . Gallbladder problem   . GERD (gastroesophageal reflux disease)   . History of kidney stones    [name]  . History of polycystic ovarian disease S/P [name]  . Hx of anxiety disorder   . Hypertension    a  . Hypothyroidism    no meds  . IBS (irritable bowel syndrome)   . IBS (irritable bowel syndrome)   . Infertility, female   . Joint pain   . Kidney problem   . Lactose intolerance   . Lupus hx positive ANA   followed by [name]; possible lupus  . Multiple food allergies   . Myalgia   . Osteoarthritis   . 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 POLYCYSTIC OVARIAN DISEASE 03/02/2007   Qualifier: Diagnosis of  By: Frances Darner MD, [name] for cysts & endometriosis; Frances Reino Kent, Gyn Seeing Frances Paula Compton    . PONV (postoperative nausea and vomiting)   . Prediabetes   . Sweating profusely   . Symptomatic mammary hypertrophy   . URI (upper respiratory infection)    currently on cefdinir    PAST SURGICAL HISTORY: Past Surgical History:  Procedure Laterality Date  . [place]   exploratory lap  . BREAST REDUCTION SURGERY Bilateral 03/26/2015   Procedure: BILATERAL BREAST REDUCTION ;  Surgeon: Frances Going, DO;  Location: [facility];  Service: Plastics;  Laterality: Bilateral;  . CARPAL TUNNEL RELEASE     bilateral; Frances Sherwood Gambler  . COLONOSCOPY     in 1990s  . CYSTO/ RIGHT RETROGRADE URETERAL PYELOGRAM  07-09-2004   HX BILATERAL RENAL STONES/ RIGHT FLANK PAIN  . CYSTOSCOPY W/ URETERAL STENT PLACEMENT  11/12/2011   Procedure: CYSTOSCOPY WITH RETROGRADE PYELOGRAM/URETERAL STENT  PLACEMENT;  Surgeon: Alexis Frock, MD;  Location: [facility];  Service: Urology;  Laterality: Right;  . CYSTOSCOPY W/ URETERAL STENT REMOVAL  12/07/2011   Procedure: CYSTOSCOPY WITH STENT REMOVAL;  Surgeon: Alexis Frock, MD;  Location: [facility];  Service: Urology;  Laterality: Right;  . CYSTOSCOPY/RETROGRADE/URETEROSCOPY/STONE EXTRACTION WITH BASKET  12/07/2011   Procedure: CYSTOSCOPY/RETROGRADE/URETEROSCOPY/STONE EXTRACTION WITH BASKET;  Surgeon: Alexis Frock, MD;  Location: [facility];  Service: Urology;  Laterality: Right;  . DILATION AND CURETTAGE OF UTERUS     X5  . EXCISION LEFT BARTHOLIN GLAND  02-05-2002  . EYE  SURGERY     Retinal tears surgery bilateral  . LAPAROSCOPIC ASSISTED VAGINAL HYSTERECTOMY  2000  . LAPAROSCOPIC CHOLECYSTECTOMY  05-31-2007  . LAPAROSCOPIC LASER ABLATION ENDOMETRIOSIS AND LEFT SALPINGO-OOPHORECTOMY  11-03-1999  . LAPAROSCOPY WITH RIGHT SALPINGO-OOPHECTOMY/ LYSIS ADHESIONS AND ABLATION ENDOMETRIOSIS  05-17-2001  . LIPOSUCTION Bilateral 03/26/2015   Procedure: WITH LIPOSUCTION;  Surgeon: Frances Going, DO;  Location: [facility];  Service: Plastics;  Laterality: Bilateral;  . LUMBAR LAMINECTOMY  2003   L4 - L5  . PLANTAR FASCIA SURGERY     right  . PLANTAR FASCIA SURGERY  02/2011   left  . RIGHT URETEROSCOPIC STONE EXTRACTION  08-04-2000  . TONSILLECTOMY    . URETERAL STENT PLACEMENT  11/12/11   Frances Tresa Moore    SOCIAL HISTORY: Social History   Tobacco Use  . Smoking status: Never Smoker  . Smokeless tobacco: Never Used  Substance Use Topics  . Alcohol use: Yes    Alcohol/week: 0.6 oz    Types: 1 Shots of liquor per week    Comment:  very rarely  . Drug use: No    FAMILY HISTORY: Family History  Problem Relation Age of Onset  . Hypertension Mother   . Colon polyps Mother   . Atrial fibrillation Mother        [name]  . Hyperlipidemia Mother   . Heart disease Mother   . Anxiety disorder  Mother   . Sleep apnea Mother   . Obesity Mother   . Heart attack Father 53  . Diabetes Father   . Hypertension Father   . Hyperlipidemia Father   . Heart disease Father   . Stroke Father   . Obesity Father   . Hypertension Sister   . Heart attack Paternal Grandmother 88  . Breast cancer Paternal Aunt   . Colon cancer Maternal Uncle   . Colon cancer Paternal Uncle   . Colon polyps Maternal Grandmother   . Stroke Maternal Grandmother 76  . Diabetes Paternal Grandfather   . Stroke Maternal Grandfather 37    ROS: Review of Systems  Constitutional: Positive for malaise/fatigue and weight loss.  Gastrointestinal: Negative for nausea and vomiting.  Endo/Heme/Allergies:       Negative hypoglycemia Positive polyphagia  Psychiatric/Behavioral: Positive for depression. Negative for suicidal ideas.    PHYSICAL EXAM: Blood pressure 111/66, pulse 77, temperature 98.7 F (37.1 C), temperature source Oral, height 5\' 8"  (1.727 m), weight 233 lb (105.7 kg), SpO2 100 %. Body mass index is 35.43 kg/m. Physical Exam  Constitutional: She is oriented to person, place, and time. [name].  Cardiovascular: Normal rate.  Pulmonary/Chest: Effort normal.  Musculoskeletal: Normal range of motion.  Neurological: She is oriented to person, place, and time.  Skin: Skin is warm and dry.  Psychiatric: She has a normal mood and affect. Her behavior is normal.  Vitals reviewed.   RECENT LABS AND TESTS: BMET    Component Value Date/Time   NA 143 02/06/2017 0822   K 4.6 02/06/2017 0822   CL 105 02/06/2017 0822   CO2 24 02/06/2017 0822   GLUCOSE 91 02/06/2017 0822   GLUCOSE 92 08/16/2016 1011   BUN 16 02/06/2017 0822   CREATININE 0.76 02/06/2017 0822   CALCIUM 9.5 02/06/2017 0822   GFRNONAA 94 02/06/2017 0822   GFRAA 108 02/06/2017 0822   Lab Results  Component Value Date   HGBA1C 5.6 02/06/2017   HGBA1C 5.6 09/28/2016   HGBA1C 5.9 08/16/2016  HGBA1C 5.9 06/04/2015   HGBA1C 5.6 09/22/2014   Lab Results  Component Value Date   INSULIN 28.1 (H) 02/06/2017   INSULIN 14.7 09/28/2016   CBC    Component Value Date/Time   WBC 6.6 08/16/2016 1011   RBC 4.68 08/16/2016 1011   HGB 13.2 08/16/2016 1011   HCT 39.4 08/16/2016 1011   PLT 235.0 08/16/2016 1011   MCV 84.2 08/16/2016 1011   MCH 27.0 04/19/2015 0650   MCHC 33.4 08/16/2016 1011   RDW 13.9 08/16/2016 1011   LYMPHSABS 1.8 08/16/2016 1011   MONOABS 0.6 08/16/2016 1011   EOSABS 0.2 08/16/2016 1011   BASOSABS 0.0 08/16/2016 1011   Iron/TIBC/Ferritin/ %Sat No results found for: IRON, TIBC, FERRITIN, IRONPCTSAT Lipid Panel     Component Value Date/Time   CHOL 153 02/06/2017 0822   TRIG 78 02/06/2017 0822   HDL 51 02/06/2017 0822   CHOLHDL 3 08/16/2016 1011   VLDL 12.2 08/16/2016 1011   LDLCALC 86 02/06/2017 0822   Hepatic Function Panel     Component Value Date/Time   PROT 6.8 02/06/2017 0822   ALBUMIN 4.1 02/06/2017 0822   AST 20 02/06/2017 0822   ALT 23 02/06/2017 0822   ALKPHOS 94 02/06/2017 0822   BILITOT 0.2 02/06/2017 0822   BILIDIR 0.1 06/12/2013 1632   IBILI 0.2 06/11/2009 2309      Component Value Date/Time   TSH 1.54 08/16/2016 1011   TSH 2.40 06/04/2015 0857   TSH 2.45 02/16/2015 1706    ASSESSMENT AND PLAN: Prediabetes - Plan: metFORMIN (GLUCOPHAGE) 500 MG tablet  Other depression - with emotional eating - Plan: buPROPion (WELLBUTRIN SR) 150 MG 12 hr tablet  Class 2 severe obesity with serious comorbidity and body mass index (BMI) of 35.0 to 35.9 in adult, unspecified obesity type ([place])  PLAN:  Pre-Diabetes Doneisha will continue to work on weight loss, exercise, and decreasing simple carbohydrates in her diet to help decrease the risk of diabetes. We dicussed metformin including benefits and risks. She was informed that eating too many simple carbohydrates or too many calories at one sitting increases the likelihood of GI side effects.  Joia agrees to continue taking metformin 500 mg BID #60 and we will refill for 1 month. Vonda agrees to follow up with our clinic in 2 weeks as directed to monitor her progress.  Depression with Emotional Eating Behaviors We discussed behavior modification techniques today to help Bhumi deal with her emotional eating and depression. Bethann agrees to increase Wellbutrin SR to 150 mg BID #60 with no refills. Concha agrees to follow up with our clinic in 2 weeks.  Obesity Philena is currently in the action stage of change. As such, her goal is to continue with weight loss efforts She has agreed to keep a food journal with 1600 calories and 90+ grams of protein daily Amore has been instructed to work up to a goal of 150 minutes of combined cardio and strengthening exercise per week for weight loss and overall health benefits. We discussed the following Behavioral Modification Strategies today: increasing lean protein intake and keep a strict food journal   Spenser has agreed to follow up with our clinic in 2 weeks. She was informed of the importance of frequent follow up visits to maximize her success with intensive lifestyle modifications for her multiple health conditions.   OBESITY BEHAVIORAL INTERVENTION VISIT  Today's visit was # 14 out of 22.  Starting weight: 241 lbs Starting date: 09/28/16 Today's weight : 233  lbs  Today's date: 05/16/2017 Total lbs lost to date: 8 (Patients must lose 7 lbs in the first 6 months to continue with counseling)   ASK: We discussed the diagnosis of obesity with Anne Ng today and Shaquayla agreed to give [place] permission to discuss obesity behavioral modification therapy today.  ASSESS: Braeleigh has the diagnosis of obesity and her BMI today is 35.44 Sally is in the action stage of change   ADVISE: Yoltzin was educated on the multiple health risks of obesity as well as the benefit of weight loss to improve her health. She  was advised of the need for long term treatment and the importance of lifestyle modifications.  AGREE: Multiple dietary modification options and treatment options were discussed and  Kaisha agreed to the above obesity treatment plan.  I, Trixie Dredge, am acting as transcriptionist for Dennard Nip, MD  I have reviewed the above documentation for accuracy and completeness, and I agree with the above. -Dennard Nip, MD

## 2017-05-22 DIAGNOSIS — M25511 Pain in right shoulder: Secondary | ICD-10-CM | POA: Diagnosis not present

## 2017-05-30 ENCOUNTER — Ambulatory Visit (INDEPENDENT_AMBULATORY_CARE_PROVIDER_SITE_OTHER): Payer: PPO | Admitting: Family Medicine

## 2017-05-30 DIAGNOSIS — M533 Sacrococcygeal disorders, not elsewhere classified: Secondary | ICD-10-CM | POA: Diagnosis not present

## 2017-05-30 DIAGNOSIS — M25511 Pain in right shoulder: Secondary | ICD-10-CM | POA: Diagnosis not present

## 2017-06-02 DIAGNOSIS — M25511 Pain in right shoulder: Secondary | ICD-10-CM | POA: Diagnosis not present

## 2017-06-06 ENCOUNTER — Ambulatory Visit (INDEPENDENT_AMBULATORY_CARE_PROVIDER_SITE_OTHER): Payer: PPO | Admitting: Family Medicine

## 2017-06-06 VITALS — BP 130/75 | HR 82 | Temp 98.1°F | Ht 68.0 in | Wt 233.0 lb

## 2017-06-06 DIAGNOSIS — E559 Vitamin D deficiency, unspecified: Secondary | ICD-10-CM

## 2017-06-06 DIAGNOSIS — G4709 Other insomnia: Secondary | ICD-10-CM | POA: Diagnosis not present

## 2017-06-06 DIAGNOSIS — F3289 Other specified depressive episodes: Secondary | ICD-10-CM | POA: Diagnosis not present

## 2017-06-06 DIAGNOSIS — Z9189 Other specified personal risk factors, not elsewhere classified: Secondary | ICD-10-CM | POA: Diagnosis not present

## 2017-06-06 DIAGNOSIS — G4700 Insomnia, unspecified: Secondary | ICD-10-CM | POA: Insufficient documentation

## 2017-06-06 DIAGNOSIS — R7303 Prediabetes: Secondary | ICD-10-CM

## 2017-06-06 DIAGNOSIS — Z6835 Body mass index (BMI) 35.0-35.9, adult: Secondary | ICD-10-CM

## 2017-06-06 MED ORDER — METFORMIN HCL 500 MG PO TABS: 500.0000 mg | ORAL_TABLET | Freq: Two times a day (BID) | ORAL | 0 refills | Status: DC

## 2017-06-06 MED ORDER — VITAMIN D (ERGOCALCIFEROL) 1.25 MG (50000 UNIT) PO CAPS: 50000.0000 [IU] | ORAL_CAPSULE | ORAL | 0 refills | Status: DC

## 2017-06-06 MED ORDER — BUPROPION HCL ER (SR) 150 MG PO TB12: 150.0000 mg | ORAL_TABLET | Freq: Two times a day (BID) | ORAL | 0 refills | Status: DC

## 2017-06-06 NOTE — Progress Notes (Signed)
Office: [phone]  /  Fax: [phone]   HPI:   Chief Complaint: OBESITY [name] is here to discuss her progress with her obesity treatment plan. She is on the  keep a food journal with 1600 calories and 90+ grams of protein daily and is following her eating plan approximately 95 % of the time. She states she is exercising 0 minutes 0 times per week. [name] has done well with maintaining weight, but is frustrated she hasn't lost further. She is journaling most of the time and is working on increasing lean protein intake. [name] is going to have rotator cuff surgery, and is worried about increasing boredom eating while recovering. Her weight is 233 lb (105.7 kg) today and has maintained weight over a period of 3 weeks since her last visit. She has lost 8 lbs since starting treatment with [place].  Pre-Diabetes [name] has a diagnosis of pre-diabetes based on her elevated Hgb A1c and was informed this puts her at greater risk of developing diabetes. She is stable on metformin currently and is working on decreasing simple carbohydrates in her diet. Cardelia continues to work on diet and exercise to decrease risk of diabetes. She denies hypoglycemia.  At risk for diabetes [name] is at higher than average risk for developing diabetes due to her obesity and pre-diabetes. She currently denies polyuria or polydipsia.  Vitamin D deficiency [name] has a diagnosis of vitamin D deficiency. She is on prescription vit D and is not yet at goal. She denies nausea, vomiting or muscle weakness.   Ref. Range 02/06/2017 08:22  Vitamin D, 25-Hydroxy Latest Ref Range: 30.0 - 100.0 [name]/mL 43.7   Insomnia Donzella was changed from doxepin to topiramate, but she has a history of multiple kidney stones.  Depression with emotional eating behaviors [name] mood is stable on wellbutrin and she has no worsening insomnia (but her insomnia is not improving either). Her blood pressure is controlled. [name]  struggles with emotional eating and using food for comfort to the extent that it is negatively impacting her health. She often snacks when she is not hungry. [name] sometimes feels she is out of control and then feels guilty that she made poor food choices. She has been working on behavior modification techniques to help reduce her emotional eating and has been somewhat successful. She shows no sign of suicidal or homicidal ideations.  Depression screen [facility] 2/9 09/28/2016 07/29/2015 09/22/2014  Decreased Interest 2 0 0  Down, Depressed, Hopeless 1 0 0  PHQ - 2 Score 3 0 0  Altered sleeping 1 - -  Tired, decreased energy 2 - -  Change in appetite 1 - -  Feeling bad or failure about yourself  1 - -  Trouble concentrating 2 - -  Moving slowly or fidgety/restless 1 - -  Suicidal thoughts 0 - -  PHQ-9 Score 11 - -  Some recent data might be hidden     ALLERGIES: Allergies  Allergen Reactions  . Chloraprep One Step [Chlorhexidine Gluconate] Rash    Developed severe rash where chloraprep was used on chest area  . Ivp Dye [Iodinated Diagnostic Agents] Rash and Other (See Comments)    Flushing, minor facial rash & dyspnea  . Nalbuphine Shortness Of Breath and Rash    Nubain caused respiratory distress & rash  . Septra [Sulfamethoxazole-Trimethoprim] Shortness Of Breath and Rash  . Sulfamethoxazole-Trimethoprim Rash    rash  . Tylox [Oxycodone-Acetaminophen] Rash    MEDICATIONS: Current Outpatient Medications on File Prior to Visit  Medication Sig Dispense Refill  . aspirin 81 MG tablet Take 81 mg by mouth daily.     . Fluticasone-Salmeterol (ADVAIR DISKUS) 100-50 MCG/DOSE AEPB Inhale 1 puff into the lungs 2 (two) times daily. 60 each 5  . gabapentin (NEURONTIN) 100 MG capsule Take 2 capsules (200 mg total) by mouth at bedtime. (Patient taking differently: Take 100 mg by mouth at bedtime. ) 60 capsule 3  . hydrochlorothiazide (MICROZIDE) 12.5 MG capsule Take 1 capsule (12.5 mg total) by  mouth daily. 90 capsule 2  . HYDROcodone-acetaminophen (NORCO/VICODIN) 5-325 MG tablet Take 1-2 tablets every 6 (six) hours as needed by mouth for moderate pain. 30 tablet 0  . lisinopril (PRINIVIL,ZESTRIL) 40 MG tablet TAKE 1 TABLET EVERY DAY 90 tablet 2  . montelukast (SINGULAIR) 10 MG tablet Take 1 tablet (10 mg total) by mouth at bedtime. 90 tablet 3  . polyethylene glycol powder (GLYCOLAX/MIRALAX) powder Take 17 g by mouth daily. 3350 g 0  . topiramate (TOPAMAX) 25 MG tablet Take 25 mg by mouth 2 (two) times daily.     No current facility-administered medications on file prior to visit.     PAST MEDICAL HISTORY: Past Medical History:  Diagnosis Date  . Anemia   . Anxiety   . Arthritis    inflammitory  . Asthma   . B12 deficiency   . Back pain   . Chronic joint pain   . Constipation   . DDD (degenerative disc disease), lumbar    low back and neck and shoulders  . Depression    during divorce & legal matters  . Fatty liver   . Fibromyalgia   . Gallbladder problem   . GERD (gastroesophageal reflux disease)   . History of kidney stones    Dr Phebe Colla  . History of polycystic ovarian disease S/P BSO  . Hx of anxiety disorder   . Hypertension    a  . Hypothyroidism    no meds  . IBS (irritable bowel syndrome)   . IBS (irritable bowel syndrome)   . Infertility, female   . Joint pain   . Kidney problem   . Lactose intolerance   . Lupus hx positive ANA   followed by Dr Gavin Pound; possible lupus  . Multiple food allergies   . Myalgia   . Osteoarthritis   . 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 POLYCYSTIC OVARIAN DISEASE 03/02/2007   Qualifier: Diagnosis of  By: Linna Darner MD, Rae Mar BSO for cysts & endometriosis; Dr Reino Kent, Gyn Seeing Dr Paula Compton    . PONV (postoperative nausea and vomiting)   . Prediabetes   . Sweating profusely   . Symptomatic mammary hypertrophy   . URI (upper respiratory infection)    currently on cefdinir    PAST SURGICAL  HISTORY: Past Surgical History:  Procedure Laterality Date  . [place]   exploratory lap  . BREAST REDUCTION SURGERY Bilateral 03/26/2015   Procedure: BILATERAL BREAST REDUCTION ;  Surgeon: Wallace Going, DO;  Location: [facility];  Service: Plastics;  Laterality: Bilateral;  . CARPAL TUNNEL RELEASE     bilateral; Dr Sherwood Gambler  . COLONOSCOPY     in 1990s  . CYSTO/ RIGHT RETROGRADE URETERAL PYELOGRAM  07-09-2004   HX BILATERAL RENAL STONES/ RIGHT FLANK PAIN  . CYSTOSCOPY W/ URETERAL STENT PLACEMENT  11/12/2011   Procedure: CYSTOSCOPY WITH RETROGRADE PYELOGRAM/URETERAL STENT PLACEMENT;  Surgeon: Alexis Frock,  MD;  Location: [facility];  Service: Urology;  Laterality: Right;  . CYSTOSCOPY W/ URETERAL STENT REMOVAL  12/07/2011   Procedure: CYSTOSCOPY WITH STENT REMOVAL;  Surgeon: Alexis Frock, MD;  Location[facility];  Service: Urology;  Laterality: Right;  . CYSTOSCOPY/RETROGRADE/URETEROSCOPY/STONE EXTRACTION WITH BASKET  12/07/2011   Procedure: CYSTOSCOPY/RETROGRADE/URETEROSCOPY/STONE EXTRACTION WITH BASKET;  Surgeon: Alexis Frock, MD;  Location: [facility];  Service: Urology;  Laterality: Right;  . DILATION AND CURETTAGE OF UTERUS     X5  . EXCISION LEFT BARTHOLIN GLAND  02-05-2002  . EYE SURGERY     Retinal tears surgery bilateral  . LAPAROSCOPIC ASSISTED VAGINAL HYSTERECTOMY  2000  . LAPAROSCOPIC CHOLECYSTECTOMY  05-31-2007  . LAPAROSCOPIC LASER ABLATION ENDOMETRIOSIS AND LEFT SALPINGO-OOPHORECTOMY  11-03-1999  . LAPAROSCOPY WITH RIGHT SALPINGO-OOPHECTOMY/ LYSIS ADHESIONS AND ABLATION ENDOMETRIOSIS  05-17-2001  . LIPOSUCTION Bilateral 03/26/2015   Procedure: WITH LIPOSUCTION;  Surgeon: Wallace Going, DO;  Location: [facility];  Service: Plastics;  Laterality: Bilateral;  . LUMBAR LAMINECTOMY  2003   L4 - L5  . PLANTAR FASCIA SURGERY     right  . PLANTAR FASCIA SURGERY   02/2011   left  . RIGHT URETEROSCOPIC STONE EXTRACTION  08-04-2000  . TONSILLECTOMY    . URETERAL STENT PLACEMENT  11/12/11   Dr Tresa Moore    SOCIAL HISTORY: Social History   Tobacco Use  . Smoking status: Never Smoker  . Smokeless tobacco: Never Used  Substance Use Topics  . Alcohol use: Yes    Alcohol/week: 0.6 oz    Types: 1 Shots of liquor per week    Comment:  very rarely  . Drug use: No    FAMILY HISTORY: Family History  Problem Relation Age of Onset  . Hypertension Mother   . Colon polyps Mother   . Atrial fibrillation Mother        Dr Angelena Form  . Hyperlipidemia Mother   . Heart disease Mother   . Anxiety disorder Mother   . Sleep apnea Mother   . Obesity Mother   . Heart attack Father 84  . Diabetes Father   . Hypertension Father   . Hyperlipidemia Father   . Heart disease Father   . Stroke Father   . Obesity Father   . Hypertension Sister   . Heart attack Paternal Grandmother 59  . Breast cancer Paternal Aunt   . Colon cancer Maternal Uncle   . Colon cancer Paternal Uncle   . Colon polyps Maternal Grandmother   . Stroke Maternal Grandmother 76  . Diabetes Paternal Grandfather   . Stroke Maternal Grandfather 37    ROS: Review of Systems  Constitutional: Negative for weight loss.  Gastrointestinal: Negative for nausea and vomiting.  Genitourinary: Negative for frequency.  Musculoskeletal:       Negative for muscle weakness  Endo/Heme/Allergies: Negative for polydipsia.       Negative for hypoglycemia  Psychiatric/Behavioral: Positive for depression. Negative for suicidal ideas. The patient has insomnia.     PHYSICAL EXAM: Blood pressure 130/75, pulse 82, temperature 98.1 F (36.7 C), temperature source Oral, height 5\' 8"  (1.727 m), weight 233 lb (105.7 kg), SpO2 100 %. Body mass index is 35.43 kg/m. Physical Exam  Constitutional: She is oriented to person, place, and time. [name].  Cardiovascular: Normal  rate.  Pulmonary/Chest: Effort normal.  Musculoskeletal: Normal range of motion.  Neurological: She is oriented to person, place, and time.  Skin: Skin is warm  and dry.  Psychiatric: She has a normal mood and affect. Her behavior is normal.  Vitals reviewed.   RECENT LABS AND TESTS: BMET    Component Value Date/Time   NA 143 02/06/2017 0822   K 4.6 02/06/2017 0822   CL 105 02/06/2017 0822   CO2 24 02/06/2017 0822   GLUCOSE 91 02/06/2017 0822   GLUCOSE 92 08/16/2016 1011   BUN 16 02/06/2017 0822   CREATININE 0.76 02/06/2017 0822   CALCIUM 9.5 02/06/2017 0822   GFRNONAA 94 02/06/2017 0822   GFRAA 108 02/06/2017 0822   Lab Results  Component Value Date   HGBA1C 5.6 02/06/2017   HGBA1C 5.6 09/28/2016   HGBA1C 5.9 08/16/2016   HGBA1C 5.9 06/04/2015   HGBA1C 5.6 09/22/2014   Lab Results  Component Value Date   INSULIN 28.1 (H) 02/06/2017   INSULIN 14.7 09/28/2016   CBC    Component Value Date/Time   WBC 6.6 08/16/2016 1011   RBC 4.68 08/16/2016 1011   HGB 13.2 08/16/2016 1011   HCT 39.4 08/16/2016 1011   PLT 235.0 08/16/2016 1011   MCV 84.2 08/16/2016 1011   MCH 27.0 04/19/2015 0650   MCHC 33.4 08/16/2016 1011   RDW 13.9 08/16/2016 1011   LYMPHSABS 1.8 08/16/2016 1011   MONOABS 0.6 08/16/2016 1011   EOSABS 0.2 08/16/2016 1011   BASOSABS 0.0 08/16/2016 1011   Iron/TIBC/Ferritin/ %Sat No results found for: IRON, TIBC, FERRITIN, IRONPCTSAT Lipid Panel     Component Value Date/Time   CHOL 153 02/06/2017 0822   TRIG 78 02/06/2017 0822   HDL 51 02/06/2017 0822   CHOLHDL 3 08/16/2016 1011   VLDL 12.2 08/16/2016 1011   LDLCALC 86 02/06/2017 0822   Hepatic Function Panel     Component Value Date/Time   PROT 6.8 02/06/2017 0822   ALBUMIN 4.1 02/06/2017 0822   AST 20 02/06/2017 0822   ALT 23 02/06/2017 0822   ALKPHOS 94 02/06/2017 0822   BILITOT 0.2 02/06/2017 0822   BILIDIR 0.1 06/12/2013 1632   IBILI 0.2 06/11/2009 2309      Component Value Date/Time     TSH 1.54 08/16/2016 1011   TSH 2.40 06/04/2015 0857   TSH 2.45 02/16/2015 1706    Ref. Range 02/06/2017 08:22  Vitamin D, 25-Hydroxy Latest Ref Range: 30.0 - 100.0 [name]/mL 43.7   ASSESSMENT AND PLAN: Prediabetes - Plan: metFORMIN (GLUCOPHAGE) 500 MG tablet  Vitamin D deficiency - Plan: Vitamin D, Ergocalciferol, (DRISDOL) 50000 units CAPS capsule  Other insomnia  Other depression - with emotional eating - Plan: buPROPion (WELLBUTRIN SR) 150 MG 12 hr tablet  At risk for heart disease  Class 2 severe obesity with serious comorbidity and body mass index (BMI) of 35.0 to 35.9 in adult, unspecified obesity type ([place])  PLAN:  Pre-Diabetes [name] will continue to work on weight loss, exercise, and decreasing simple carbohydrates in her diet to help decrease the risk of diabetes. We dicussed metformin including benefits and risks. She was informed that eating too many simple carbohydrates or too many calories at one sitting increases the likelihood of GI side effects. [name] requested metformin for now and a prescription was written today for 1 month refill. [name] agreed to follow up with [place] as directed to monitor her progress.  Diabetes risk counseling [name] was given extended (15 minutes) diabetes prevention counseling today. She is 47 y.o. female and has risk factors for diabetes including obesity and pre-diabetes. We discussed intensive lifestyle modifications today with an emphasis on  weight loss as well as increasing exercise and decreasing simple carbohydrates in her diet.  Vitamin D Deficiency [name] was informed that low vitamin D levels contributes to fatigue and are associated with obesity, breast, and colon cancer. She agrees to continue to take prescription Vit D @50 ,000 IU every week #4 with no refills and will follow up for routine testing of vitamin D, at least 2-3 times per year. She was informed of the risk of over-replacement of vitamin D and agrees to not increase  her dose unless she discusses this with [place] first. [name] agrees to follow up with our clinic in 4 weeks.  Insomnia [name] was advised to contact her physician and remind them of her kidney stone history, to verify if they still want her to take topiramate.  Depression with Emotional Eating Behaviors We discussed behavior modification techniques today to help [name] deal with her emotional eating and depression. She has agreed to continue Wellbutrin SR 150 mg bid #60 with no refills and follow up as directed.  Obesity [name] is currently in the action stage of change. As such, her goal is to continue with weight loss efforts She has agreed to keep a food journal with 1400 to 1600 calories and 90+ grams of protein daily [name] has been instructed to work up to a goal of 150 minutes of combined cardio and strengthening exercise per week for weight loss and overall health benefits. We discussed the following Behavioral Modification Strategies today: keep a strict food journal, increasing lean protein intake, decreasing simple carbohydrates  and emotional eating strategies  [name] has agreed to follow up with our clinic in 4 weeks. She was informed of the importance of frequent follow up visits to maximize her success with intensive lifestyle modifications for her multiple health conditions.   OBESITY BEHAVIORAL INTERVENTION VISIT  Today's visit was # 15 out of 22.  Starting weight: 241 lbs Starting date: 09/28/16 Today's weight : 233 lbs Today's date: 06/06/2017 Total lbs lost to date: 8 (Patients must lose 7 lbs in the first 6 months to continue with counseling)   ASK: We discussed the diagnosis of obesity with [name] today and [name] agreed to give [place] permission to discuss obesity behavioral modification therapy today.  ASSESS: [name] has the diagnosis of obesity and her BMI today is 35.44 [name] is in the action stage of change   ADVISE: [name] was  educated on the multiple health risks of obesity as well as the benefit of weight loss to improve her health. She was advised of the need for long term treatment and the importance of lifestyle modifications.  AGREE: Multiple dietary modification options and treatment options were discussed and  [name] agreed to the above obesity treatment plan.  I, Doreene Nest, am acting as transcriptionist for Dennard Nip, MD  I have reviewed the above documentation for accuracy and completeness, and I agree with the above. -Dennard Nip, MD

## 2017-06-13 ENCOUNTER — Ambulatory Visit (INDEPENDENT_AMBULATORY_CARE_PROVIDER_SITE_OTHER): Payer: PPO | Admitting: Family Medicine

## 2017-06-15 DIAGNOSIS — Y999 Unspecified external cause status: Secondary | ICD-10-CM | POA: Diagnosis not present

## 2017-06-15 DIAGNOSIS — M24111 Other articular cartilage disorders, right shoulder: Secondary | ICD-10-CM | POA: Diagnosis not present

## 2017-06-15 DIAGNOSIS — M19011 Primary osteoarthritis, right shoulder: Secondary | ICD-10-CM | POA: Diagnosis not present

## 2017-06-15 DIAGNOSIS — X58XXXA Exposure to other specified factors, initial encounter: Secondary | ICD-10-CM | POA: Diagnosis not present

## 2017-06-15 DIAGNOSIS — S46211A Strain of muscle, fascia and tendon of other parts of biceps, right arm, initial encounter: Secondary | ICD-10-CM | POA: Diagnosis not present

## 2017-06-15 DIAGNOSIS — M7541 Impingement syndrome of right shoulder: Secondary | ICD-10-CM | POA: Diagnosis not present

## 2017-06-15 DIAGNOSIS — M7551 Bursitis of right shoulder: Secondary | ICD-10-CM | POA: Diagnosis not present

## 2017-06-15 DIAGNOSIS — M75121 Complete rotator cuff tear or rupture of right shoulder, not specified as traumatic: Secondary | ICD-10-CM | POA: Diagnosis not present

## 2017-06-15 DIAGNOSIS — S43431A Superior glenoid labrum lesion of right shoulder, initial encounter: Secondary | ICD-10-CM | POA: Diagnosis not present

## 2017-06-15 DIAGNOSIS — S46011A Strain of muscle(s) and tendon(s) of the rotator cuff of right shoulder, initial encounter: Secondary | ICD-10-CM | POA: Diagnosis not present

## 2017-06-15 DIAGNOSIS — G8918 Other acute postprocedural pain: Secondary | ICD-10-CM | POA: Diagnosis not present

## 2017-06-15 DIAGNOSIS — M7521 Bicipital tendinitis, right shoulder: Secondary | ICD-10-CM | POA: Diagnosis not present

## 2017-06-19 DIAGNOSIS — M75121 Complete rotator cuff tear or rupture of right shoulder, not specified as traumatic: Secondary | ICD-10-CM | POA: Diagnosis not present

## 2017-06-19 DIAGNOSIS — M25511 Pain in right shoulder: Secondary | ICD-10-CM | POA: Diagnosis not present

## 2017-06-19 DIAGNOSIS — M6281 Muscle weakness (generalized): Secondary | ICD-10-CM | POA: Diagnosis not present

## 2017-06-19 DIAGNOSIS — M25611 Stiffness of right shoulder, not elsewhere classified: Secondary | ICD-10-CM | POA: Diagnosis not present

## 2017-06-26 DIAGNOSIS — M25511 Pain in right shoulder: Secondary | ICD-10-CM | POA: Diagnosis not present

## 2017-06-27 DIAGNOSIS — M6281 Muscle weakness (generalized): Secondary | ICD-10-CM | POA: Diagnosis not present

## 2017-06-27 DIAGNOSIS — M25611 Stiffness of right shoulder, not elsewhere classified: Secondary | ICD-10-CM | POA: Diagnosis not present

## 2017-06-27 DIAGNOSIS — M75121 Complete rotator cuff tear or rupture of right shoulder, not specified as traumatic: Secondary | ICD-10-CM | POA: Diagnosis not present

## 2017-06-27 DIAGNOSIS — M25511 Pain in right shoulder: Secondary | ICD-10-CM | POA: Diagnosis not present

## 2017-07-01 ENCOUNTER — Other Ambulatory Visit (INDEPENDENT_AMBULATORY_CARE_PROVIDER_SITE_OTHER): Payer: Self-pay | Admitting: Family Medicine

## 2017-07-01 DIAGNOSIS — E559 Vitamin D deficiency, unspecified: Secondary | ICD-10-CM

## 2017-07-03 DIAGNOSIS — M75121 Complete rotator cuff tear or rupture of right shoulder, not specified as traumatic: Secondary | ICD-10-CM | POA: Diagnosis not present

## 2017-07-03 DIAGNOSIS — M25611 Stiffness of right shoulder, not elsewhere classified: Secondary | ICD-10-CM | POA: Diagnosis not present

## 2017-07-03 DIAGNOSIS — M25511 Pain in right shoulder: Secondary | ICD-10-CM | POA: Diagnosis not present

## 2017-07-03 DIAGNOSIS — M6281 Muscle weakness (generalized): Secondary | ICD-10-CM | POA: Diagnosis not present

## 2017-07-04 ENCOUNTER — Ambulatory Visit (INDEPENDENT_AMBULATORY_CARE_PROVIDER_SITE_OTHER): Payer: PPO | Admitting: Family Medicine

## 2017-07-04 VITALS — BP 118/83 | HR 85 | Temp 98.3°F | Ht 68.0 in | Wt 231.0 lb

## 2017-07-04 DIAGNOSIS — Z9189 Other specified personal risk factors, not elsewhere classified: Secondary | ICD-10-CM

## 2017-07-04 DIAGNOSIS — Z6835 Body mass index (BMI) 35.0-35.9, adult: Secondary | ICD-10-CM

## 2017-07-04 DIAGNOSIS — R7303 Prediabetes: Secondary | ICD-10-CM

## 2017-07-04 DIAGNOSIS — E559 Vitamin D deficiency, unspecified: Secondary | ICD-10-CM | POA: Diagnosis not present

## 2017-07-04 DIAGNOSIS — M533 Sacrococcygeal disorders, not elsewhere classified: Secondary | ICD-10-CM | POA: Diagnosis not present

## 2017-07-04 MED ORDER — VITAMIN D (ERGOCALCIFEROL) 1.25 MG (50000 UNIT) PO CAPS: 50000.0000 [IU] | ORAL_CAPSULE | ORAL | 0 refills | Status: DC

## 2017-07-04 NOTE — Progress Notes (Signed)
Office: [phone]  /  Fax: [phone]   HPI:   Chief Complaint: OBESITY [name] is here to discuss her progress with her obesity treatment plan. She is on the keep a food journal with 1400 to 1600 calories and 90+ grams of protein daily and is following her eating plan approximately 75 % of the time. She states she is exercising 0 minutes 0 times per week. [name] continues to do well with weight loss, even while recovering from shoulder surgery and multiple people bringing her food. She hasn't been journaling, but she is mostly controlling her portions and making smarter food choices. Her weight is 231 lb (104.8 kg) today and has had a weight loss of 2 pounds over a period of 4 weeks since her last visit. She has lost 10 lbs since starting treatment with [place].  Vitamin D deficiency [name] has a diagnosis of vitamin D deficiency. She is stable on prescription vit D and is due for labs. [name] denies nausea, vomiting or muscle weakness.  Pre-Diabetes [name] has a diagnosis of pre-diabetes based on her elevated Hgb A1c and was informed this puts her at greater risk of developing diabetes. She is stable on metformin and diet. [name] is due for labs. [name] continues to work on diet and exercise to decrease risk of diabetes. She denies nausea or hypoglycemia.  At risk for diabetes [name] is at higher than average risk for developing diabetes due to her obesity and pre-diabetes. She currently denies polyuria or polydipsia.  ALLERGIES: Allergies  Allergen Reactions  . Chloraprep One Step [Chlorhexidine Gluconate] Rash    Developed severe rash where chloraprep was used on chest area  . Ivp Dye [Iodinated Diagnostic Agents] Rash and Other (See Comments)    Flushing, minor facial rash & dyspnea  . Nalbuphine Shortness Of Breath and Rash    Nubain caused respiratory distress & rash  . Septra [Sulfamethoxazole-Trimethoprim] Shortness Of Breath and Rash  .  Sulfamethoxazole-Trimethoprim Rash    rash  . Tylox [Oxycodone-Acetaminophen] Rash    MEDICATIONS: Current Outpatient Medications on File Prior to Visit  Medication Sig Dispense Refill  . aspirin 81 MG tablet Take 81 mg by mouth daily.     [name] buPROPion (WELLBUTRIN SR) 150 MG 12 hr tablet Take 1 tablet (150 mg total) by mouth 2 (two) times daily. 60 tablet 0  . Fluticasone-Salmeterol (ADVAIR DISKUS) 100-50 MCG/DOSE AEPB Inhale 1 puff into the lungs 2 (two) times daily. 60 each 5  . gabapentin (NEURONTIN) 100 MG capsule Take 2 capsules (200 mg total) by mouth at bedtime. (Patient taking differently: Take 100 mg by mouth at bedtime. ) 60 capsule 3  . hydrochlorothiazide (MICROZIDE) 12.5 MG capsule Take 1 capsule (12.5 mg total) by mouth daily. 90 capsule 2  . HYDROcodone-acetaminophen (NORCO/VICODIN) 5-325 MG tablet Take 1-2 tablets every 6 (six) hours as needed by mouth for moderate pain. 30 tablet 0  . lisinopril (PRINIVIL,ZESTRIL) 40 MG tablet TAKE 1 TABLET EVERY DAY 90 tablet 2  . metFORMIN (GLUCOPHAGE) 500 MG tablet Take 1 tablet (500 mg total) by mouth 2 (two) times daily. 60 tablet 0  . montelukast (SINGULAIR) 10 MG tablet Take 1 tablet (10 mg total) by mouth at bedtime. 90 tablet 3  . polyethylene glycol powder (GLYCOLAX/MIRALAX) powder Take 17 g by mouth daily. 3350 g 0  . topiramate (TOPAMAX) 25 MG tablet Take 25 mg by mouth 2 (two) times daily.     No current facility-administered medications on file prior to visit.  PAST MEDICAL HISTORY: Past Medical History:  Diagnosis Date  . Anemia   . Anxiety   . Arthritis    inflammitory  . Asthma   . B12 deficiency   . Back pain   . Chronic joint pain   . Constipation   . DDD (degenerative disc disease), lumbar    low back and neck and shoulders  . Depression    during divorce & legal matters  . Fatty liver   . Fibromyalgia   . Gallbladder problem   . GERD (gastroesophageal reflux disease)   . History of kidney stones     Dr Phebe Colla  . History of polycystic ovarian disease S/P BSO  . Hx of anxiety disorder   . Hypertension    a  . Hypothyroidism    no meds  . IBS (irritable bowel syndrome)   . IBS (irritable bowel syndrome)   . Infertility, female   . Joint pain   . Kidney problem   . Lactose intolerance   . Lupus hx positive ANA   followed by Dr Gavin Pound; possible lupus  . Multiple food allergies   . Myalgia   . Osteoarthritis   . 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 POLYCYSTIC OVARIAN DISEASE 03/02/2007   Qualifier: Diagnosis of  By: Linna Darner MD, Rae Mar BSO for cysts & endometriosis; Dr Reino Kent, Gyn Seeing Dr Paula Compton    . PONV (postoperative nausea and vomiting)   . Prediabetes   . Sweating profusely   . Symptomatic mammary hypertrophy   . URI (upper respiratory infection)    currently on cefdinir    PAST SURGICAL HISTORY: Past Surgical History:  Procedure Laterality Date  . [place]   exploratory lap  . BREAST REDUCTION SURGERY Bilateral 03/26/2015   Procedure: BILATERAL BREAST REDUCTION ;  Surgeon: Wallace Going, DO;  Location: [facility];  Service: Plastics;  Laterality: Bilateral;  . CARPAL TUNNEL RELEASE     bilateral; Dr Sherwood Gambler  . COLONOSCOPY     in 1990s  . CYSTO/ RIGHT RETROGRADE URETERAL PYELOGRAM  07-09-2004   HX BILATERAL RENAL STONES/ RIGHT FLANK PAIN  . CYSTOSCOPY W/ URETERAL STENT PLACEMENT  11/12/2011   Procedure: CYSTOSCOPY WITH RETROGRADE PYELOGRAM/URETERAL STENT PLACEMENT;  Surgeon: Alexis Frock, MD;  Location: [facility];  Service: Urology;  Laterality: Right;  . CYSTOSCOPY W/ URETERAL STENT REMOVAL  12/07/2011   Procedure: CYSTOSCOPY WITH STENT REMOVAL;  Surgeon: Alexis Frock, MD;  Location: [facility];  Service: Urology;  Laterality: Right;  . CYSTOSCOPY/RETROGRADE/URETEROSCOPY/STONE EXTRACTION WITH BASKET  12/07/2011   Procedure: CYSTOSCOPY/RETROGRADE/URETEROSCOPY/STONE  EXTRACTION WITH BASKET;  Surgeon: Alexis Frock, MD;  Location: [facility];  Service: Urology;  Laterality: Right;  . DILATION AND CURETTAGE OF UTERUS     X5  . EXCISION LEFT BARTHOLIN GLAND  02-05-2002  . EYE SURGERY     Retinal tears surgery bilateral  . LAPAROSCOPIC ASSISTED VAGINAL HYSTERECTOMY  2000  . LAPAROSCOPIC CHOLECYSTECTOMY  05-31-2007  . LAPAROSCOPIC LASER ABLATION ENDOMETRIOSIS AND LEFT SALPINGO-OOPHORECTOMY  11-03-1999  . LAPAROSCOPY WITH RIGHT SALPINGO-OOPHECTOMY/ LYSIS ADHESIONS AND ABLATION ENDOMETRIOSIS  05-17-2001  . LIPOSUCTION Bilateral 03/26/2015   Procedure: WITH LIPOSUCTION;  Surgeon: Wallace Going, DO;  Location: [place];  Service: Plastics;  Laterality: Bilateral;  . LUMBAR LAMINECTOMY  2003   L4 - L5  . PLANTAR FASCIA SURGERY     right  . PLANTAR  FASCIA SURGERY  02/2011   left  . RIGHT URETEROSCOPIC STONE EXTRACTION  08-04-2000  . TONSILLECTOMY    . URETERAL STENT PLACEMENT  11/12/11   Dr Tresa Moore    SOCIAL HISTORY: Social History   Tobacco Use  . Smoking status: Never Smoker  . Smokeless tobacco: Never Used  Substance Use Topics  . Alcohol use: Yes    Alcohol/week: 0.6 oz    Types: 1 Shots of liquor per week    Comment:  very rarely  . Drug use: No    FAMILY HISTORY: Family History  Problem Relation Age of Onset  . Hypertension Mother   . Colon polyps Mother   . Atrial fibrillation Mother        Dr Angelena Form  . Hyperlipidemia Mother   . Heart disease Mother   . Anxiety disorder Mother   . Sleep apnea Mother   . Obesity Mother   . Heart attack Father 31  . Diabetes Father   . Hypertension Father   . Hyperlipidemia Father   . Heart disease Father   . Stroke Father   . Obesity Father   . Hypertension Sister   . Heart attack Paternal Grandmother 80  . Breast cancer Paternal Aunt   . Colon cancer Maternal Uncle   . Colon cancer Paternal Uncle   . Colon polyps Maternal Grandmother   . Stroke  Maternal Grandmother 76  . Diabetes Paternal Grandfather   . Stroke Maternal Grandfather 37    ROS: Review of Systems  Constitutional: Positive for weight loss.  Gastrointestinal: Negative for nausea and vomiting.  Genitourinary: Negative for frequency.  Musculoskeletal:       Negative for muscle weakness  Endo/Heme/Allergies: Negative for polydipsia.       Negative for hypoglycemia    PHYSICAL EXAM: Blood pressure 118/83, pulse 85, temperature 98.3 F (36.8 C), temperature source Oral, height 5\' 8"  (1.727 m), weight 231 lb (104.8 kg), SpO2 100 %. Body mass index is 35.12 kg/m. Physical Exam  Constitutional: She is oriented to person, place, and time. [name].  Cardiovascular: Normal rate.  Pulmonary/Chest: Effort normal.  Musculoskeletal: Normal range of motion.  Neurological: She is oriented to person, place, and time.  Skin: Skin is warm and dry.  Psychiatric: She has a normal mood and affect. Her behavior is normal.  Vitals reviewed.   RECENT LABS AND TESTS: BMET    Component Value Date/Time   NA 143 02/06/2017 0822   K 4.6 02/06/2017 0822   CL 105 02/06/2017 0822   CO2 24 02/06/2017 0822   GLUCOSE 91 02/06/2017 0822   GLUCOSE 92 08/16/2016 1011   BUN 16 02/06/2017 0822   CREATININE 0.76 02/06/2017 0822   CALCIUM 9.5 02/06/2017 0822   GFRNONAA 94 02/06/2017 0822   GFRAA 108 02/06/2017 0822   Lab Results  Component Value Date   HGBA1C 5.6 02/06/2017   HGBA1C 5.6 09/28/2016   HGBA1C 5.9 08/16/2016   HGBA1C 5.9 06/04/2015   HGBA1C 5.6 09/22/2014   Lab Results  Component Value Date   INSULIN 28.1 (H) 02/06/2017   INSULIN 14.7 09/28/2016   CBC    Component Value Date/Time   WBC 6.6 08/16/2016 1011   RBC 4.68 08/16/2016 1011   HGB 13.2 08/16/2016 1011   HCT 39.4 08/16/2016 1011   PLT 235.0 08/16/2016 1011   MCV 84.2 08/16/2016 1011   MCH 27.0 04/19/2015 0650   MCHC 33.4 08/16/2016 1011   RDW 13.9 08/16/2016  1011  LYMPHSABS 1.8 08/16/2016 1011   MONOABS 0.6 08/16/2016 1011   EOSABS 0.2 08/16/2016 1011   BASOSABS 0.0 08/16/2016 1011   Iron/TIBC/Ferritin/ %Sat No results found for: IRON, TIBC, FERRITIN, IRONPCTSAT Lipid Panel     Component Value Date/Time   CHOL 153 02/06/2017 0822   TRIG 78 02/06/2017 0822   HDL 51 02/06/2017 0822   CHOLHDL 3 08/16/2016 1011   VLDL 12.2 08/16/2016 1011   LDLCALC 86 02/06/2017 0822   Hepatic Function Panel     Component Value Date/Time   PROT 6.8 02/06/2017 0822   ALBUMIN 4.1 02/06/2017 0822   AST 20 02/06/2017 0822   ALT 23 02/06/2017 0822   ALKPHOS 94 02/06/2017 0822   BILITOT 0.2 02/06/2017 0822   BILIDIR 0.1 06/12/2013 1632   IBILI 0.2 06/11/2009 2309      Component Value Date/Time   TSH 1.54 08/16/2016 1011   TSH 2.40 06/04/2015 0857   TSH 2.45 02/16/2015 1706   Results for AMARI, BURNSWORTH (MRN [id number]) as of 07/04/2017 12:30  Ref. Range 02/06/2017 08:22  Vitamin D, 25-Hydroxy Latest Ref Range: 30.0 - 100.0 [name]/mL 43.7   ASSESSMENT AND PLAN: Vitamin D deficiency - Plan: VITAMIN D 25 Hydroxy (Vit-D Deficiency, Fractures), Vitamin D, Ergocalciferol, (DRISDOL) 50000 units CAPS capsule  Prediabetes - Plan: Vitamin B12, CBC With Differential, Comprehensive metabolic panel, Folate, Hemoglobin A1c, Insulin, random, Lipid Panel With LDL/HDL Ratio, T3, T4, free, TSH  At risk for diabetes mellitus  Class 2 severe obesity with serious comorbidity and body mass index (BMI) of 35.0 to 35.9 in adult, unspecified obesity type (HCC)  PLAN:  Vitamin D Deficiency [name] was informed that low vitamin D levels contributes to fatigue and are associated with obesity, breast, and colon cancer. She agrees to continue to take prescription Vit D @50 ,000 IU every week #4 with no refills. We will check labs and she will follow up for routine testing of vitamin D, at least 2-3 times per year. She was informed of the risk of over-replacement of vitamin D and  agrees to not increase her dose unless she discusses this with [place] first. [name] agrees to follow up with our clinic in 2 to 3 weeks.  Pre-Diabetes [name] will continue to work on weight loss, exercise, and decreasing simple carbohydrates in her diet to help decrease the risk of diabetes. We dicussed metformin including benefits and risks. She was informed that eating too many simple carbohydrates or too many calories at one sitting increases the likelihood of GI side effects. [name] agrees to continue metformin for now and a prescription was not written today. We will check labs and [name] agreed to follow up with [place] as directed to monitor her progress.  Diabetes risk counseling [name] was given extended (15 minutes) diabetes prevention counseling today. She is 47 y.o. female and has risk factors for diabetes including obesity and pre-diabetes. We discussed intensive lifestyle modifications today with an emphasis on weight loss as well as increasing exercise and decreasing simple carbohydrates in her diet.  Obesity [name] is currently in the action stage of change. As such, her goal is to continue with weight loss efforts She has agreed to keep a food journal with 1400 to 1600 calories and 90+ grams of protein daily [name] has been instructed to work up to a goal of 150 minutes of combined cardio and strengthening exercise per week for weight loss and overall health benefits. We discussed the following Behavioral Modification Strategies today: increasing lean protein intake and  decreasing simple carbohydrates   [name] has agreed to follow up with our clinic in 2 to 3 weeks. She was informed of the importance of frequent follow up visits to maximize her success with intensive lifestyle modifications for her multiple health conditions.   OBESITY BEHAVIORAL INTERVENTION VISIT  Today's visit was # 16 out of 22.  Starting weight: 241 lbs Starting date: 09/28/16 Today's weight : 231  lbs Today's date: 07/04/2017 Total lbs lost to date: 10 (Patients must lose 7 lbs in the first 6 months to continue with counseling)   ASK: We discussed the diagnosis of obesity with [name] today and Brieonna agreed to give [place] permission to discuss obesity behavioral modification therapy today.  ASSESS: [name] has the diagnosis of obesity and her BMI today is 35.13 [name] is in the action stage of change   ADVISE: [name] was educated on the multiple health risks of obesity as well as the benefit of weight loss to improve her health. She was advised of the need for long term treatment and the importance of lifestyle modifications.  AGREE: Multiple dietary modification options and treatment options were discussed and  [name] agreed to the above obesity treatment plan.  I, Doreene Nest, am acting as transcriptionist for Dennard Nip, MD  I have reviewed the above documentation for accuracy and completeness, and I agree with the above. -Dennard Nip, MD

## 2017-07-05 LAB — HEMOGLOBIN A1C
Est. average glucose Bld gHb Est-mCnc: 120 mg/dL
Hgb A1c MFr Bld: 5.8 % — ABNORMAL HIGH (ref 4.8–5.6)

## 2017-07-05 LAB — CBC WITH DIFFERENTIAL
BASOS ABS: 0 10*3/uL (ref 0.0–0.2)
Basos: 0 %
EOS (ABSOLUTE): 0.2 10*3/uL (ref 0.0–0.4)
EOS: 3 %
HEMOGLOBIN: 13 g/dL (ref 11.1–15.9)
Hematocrit: 40.3 % (ref 34.0–46.6)
IMMATURE GRANS (ABS): 0 10*3/uL (ref 0.0–0.1)
IMMATURE GRANULOCYTES: 0 %
LYMPHS ABS: 2.4 10*3/uL (ref 0.7–3.1)
Lymphs: 32 %
MCH: 27.1 pg (ref 26.6–33.0)
MCHC: 32.3 g/dL (ref 31.5–35.7)
MCV: 84 fL (ref 79–97)
MONOCYTES: 8 %
Monocytes Absolute: 0.6 10*3/uL (ref 0.1–0.9)
NEUTROS ABS: 4.3 10*3/uL (ref 1.4–7.0)
Neutrophils: 57 %
RBC: 4.8 x10E6/uL (ref 3.77–5.28)
RDW: 15.2 % (ref 12.3–15.4)
WBC: 7.5 10*3/uL (ref 3.4–10.8)

## 2017-07-05 LAB — COMPREHENSIVE METABOLIC PANEL
ALBUMIN: 4.1 g/dL (ref 3.5–5.5)
ALT: 21 IU/L (ref 0–32)
AST: 15 IU/L (ref 0–40)
Albumin/Globulin Ratio: 1.6 (ref 1.2–2.2)
Alkaline Phosphatase: 98 IU/L (ref 39–117)
BUN / CREAT RATIO: 32 — AB (ref 9–23)
BUN: 24 mg/dL (ref 6–24)
Bilirubin Total: 0.3 mg/dL (ref 0.0–1.2)
CALCIUM: 9.5 mg/dL (ref 8.7–10.2)
CO2: 22 mmol/L (ref 20–29)
CREATININE: 0.76 mg/dL (ref 0.57–1.00)
Chloride: 108 mmol/L — ABNORMAL HIGH (ref 96–106)
GFR, EST AFRICAN AMERICAN: 108 mL/min/{1.73_m2} (ref >59)
GFR, EST NON AFRICAN AMERICAN: 94 mL/min/{1.73_m2} (ref >59)
GLUCOSE: 74 mg/dL (ref 65–99)
Globulin, Total: 2.5 g/dL (ref 1.5–4.5)
Potassium: 4.2 mmol/L (ref 3.5–5.2)
SODIUM: 144 mmol/L (ref 134–144)
TOTAL PROTEIN: 6.6 g/dL (ref 6.0–8.5)

## 2017-07-05 LAB — INSULIN, RANDOM: INSULIN: 10.4 u[IU]/mL (ref 2.6–24.9)

## 2017-07-05 LAB — FOLATE: FOLATE: 12.7 ng/mL (ref >3.0)

## 2017-07-05 LAB — LIPID PANEL WITH LDL/HDL RATIO
CHOLESTEROL TOTAL: 158 mg/dL (ref 100–199)
HDL: 60 mg/dL (ref >39)
LDL Calculated: 84 mg/dL (ref 0–99)
LDl/HDL Ratio: 1.4 ratio (ref 0.0–3.2)
TRIGLYCERIDES: 69 mg/dL (ref 0–149)
VLDL CHOLESTEROL CAL: 14 mg/dL (ref 5–40)

## 2017-07-05 LAB — T3: T3 TOTAL: 126 ng/dL (ref 71–180)

## 2017-07-05 LAB — VITAMIN B12

## 2017-07-05 LAB — VITAMIN D 25 HYDROXY (VIT D DEFICIENCY, FRACTURES): VIT D 25 HYDROXY: 49.7 ng/mL (ref 30.0–100.0)

## 2017-07-05 LAB — TSH: TSH: 2.1 u[IU]/mL (ref 0.450–4.500)

## 2017-07-05 LAB — T4, FREE: Free T4: 1.01 ng/dL (ref 0.82–1.77)

## 2017-07-06 DIAGNOSIS — M75121 Complete rotator cuff tear or rupture of right shoulder, not specified as traumatic: Secondary | ICD-10-CM | POA: Diagnosis not present

## 2017-07-06 DIAGNOSIS — M25611 Stiffness of right shoulder, not elsewhere classified: Secondary | ICD-10-CM | POA: Diagnosis not present

## 2017-07-06 DIAGNOSIS — M6281 Muscle weakness (generalized): Secondary | ICD-10-CM | POA: Diagnosis not present

## 2017-07-06 DIAGNOSIS — M25511 Pain in right shoulder: Secondary | ICD-10-CM | POA: Diagnosis not present

## 2017-07-08 ENCOUNTER — Other Ambulatory Visit (INDEPENDENT_AMBULATORY_CARE_PROVIDER_SITE_OTHER): Payer: Self-pay | Admitting: Family Medicine

## 2017-07-08 DIAGNOSIS — F3289 Other specified depressive episodes: Secondary | ICD-10-CM

## 2017-07-10 ENCOUNTER — Other Ambulatory Visit (INDEPENDENT_AMBULATORY_CARE_PROVIDER_SITE_OTHER): Payer: Self-pay

## 2017-07-10 DIAGNOSIS — M25611 Stiffness of right shoulder, not elsewhere classified: Secondary | ICD-10-CM | POA: Diagnosis not present

## 2017-07-10 DIAGNOSIS — M6281 Muscle weakness (generalized): Secondary | ICD-10-CM | POA: Diagnosis not present

## 2017-07-10 DIAGNOSIS — M75121 Complete rotator cuff tear or rupture of right shoulder, not specified as traumatic: Secondary | ICD-10-CM | POA: Diagnosis not present

## 2017-07-10 DIAGNOSIS — F3289 Other specified depressive episodes: Secondary | ICD-10-CM

## 2017-07-10 DIAGNOSIS — M25511 Pain in right shoulder: Secondary | ICD-10-CM | POA: Diagnosis not present

## 2017-07-10 MED ORDER — BUPROPION HCL ER (SR) 150 MG PO TB12: 150.0000 mg | ORAL_TABLET | Freq: Two times a day (BID) | ORAL | 0 refills | Status: DC

## 2017-07-11 ENCOUNTER — Encounter (INDEPENDENT_AMBULATORY_CARE_PROVIDER_SITE_OTHER): Payer: Self-pay | Admitting: Family Medicine

## 2017-07-13 DIAGNOSIS — M25511 Pain in right shoulder: Secondary | ICD-10-CM | POA: Diagnosis not present

## 2017-07-13 DIAGNOSIS — M75121 Complete rotator cuff tear or rupture of right shoulder, not specified as traumatic: Secondary | ICD-10-CM | POA: Diagnosis not present

## 2017-07-13 DIAGNOSIS — M25611 Stiffness of right shoulder, not elsewhere classified: Secondary | ICD-10-CM | POA: Diagnosis not present

## 2017-07-13 DIAGNOSIS — M6281 Muscle weakness (generalized): Secondary | ICD-10-CM | POA: Diagnosis not present

## 2017-07-17 ENCOUNTER — Other Ambulatory Visit (INDEPENDENT_AMBULATORY_CARE_PROVIDER_SITE_OTHER): Payer: Self-pay | Admitting: Family Medicine

## 2017-07-17 DIAGNOSIS — M25511 Pain in right shoulder: Secondary | ICD-10-CM | POA: Diagnosis not present

## 2017-07-17 DIAGNOSIS — M75121 Complete rotator cuff tear or rupture of right shoulder, not specified as traumatic: Secondary | ICD-10-CM | POA: Diagnosis not present

## 2017-07-17 DIAGNOSIS — M6281 Muscle weakness (generalized): Secondary | ICD-10-CM | POA: Diagnosis not present

## 2017-07-17 DIAGNOSIS — K581 Irritable bowel syndrome with constipation: Secondary | ICD-10-CM

## 2017-07-17 DIAGNOSIS — M25611 Stiffness of right shoulder, not elsewhere classified: Secondary | ICD-10-CM | POA: Diagnosis not present

## 2017-07-19 DIAGNOSIS — E669 Obesity, unspecified: Secondary | ICD-10-CM | POA: Diagnosis not present

## 2017-07-19 DIAGNOSIS — Z6836 Body mass index (BMI) 36.0-36.9, adult: Secondary | ICD-10-CM | POA: Diagnosis not present

## 2017-07-19 DIAGNOSIS — M25512 Pain in left shoulder: Secondary | ICD-10-CM | POA: Diagnosis not present

## 2017-07-19 DIAGNOSIS — M159 Polyosteoarthritis, unspecified: Secondary | ICD-10-CM | POA: Diagnosis not present

## 2017-07-19 DIAGNOSIS — M25511 Pain in right shoulder: Secondary | ICD-10-CM | POA: Diagnosis not present

## 2017-07-19 DIAGNOSIS — Z79899 Other long term (current) drug therapy: Secondary | ICD-10-CM | POA: Diagnosis not present

## 2017-07-19 DIAGNOSIS — M542 Cervicalgia: Secondary | ICD-10-CM | POA: Diagnosis not present

## 2017-07-19 DIAGNOSIS — M2550 Pain in unspecified joint: Secondary | ICD-10-CM | POA: Diagnosis not present

## 2017-07-19 DIAGNOSIS — M25552 Pain in left hip: Secondary | ICD-10-CM | POA: Diagnosis not present

## 2017-07-24 DIAGNOSIS — M25511 Pain in right shoulder: Secondary | ICD-10-CM | POA: Diagnosis not present

## 2017-07-24 DIAGNOSIS — M6281 Muscle weakness (generalized): Secondary | ICD-10-CM | POA: Diagnosis not present

## 2017-07-24 DIAGNOSIS — M25611 Stiffness of right shoulder, not elsewhere classified: Secondary | ICD-10-CM | POA: Diagnosis not present

## 2017-07-24 DIAGNOSIS — M75121 Complete rotator cuff tear or rupture of right shoulder, not specified as traumatic: Secondary | ICD-10-CM | POA: Diagnosis not present

## 2017-07-25 ENCOUNTER — Ambulatory Visit (INDEPENDENT_AMBULATORY_CARE_PROVIDER_SITE_OTHER): Payer: PPO | Admitting: Family Medicine

## 2017-07-25 VITALS — BP 115/77 | HR 84 | Temp 98.9°F | Ht 68.0 in | Wt 233.0 lb

## 2017-07-25 DIAGNOSIS — R7303 Prediabetes: Secondary | ICD-10-CM

## 2017-07-25 DIAGNOSIS — F3289 Other specified depressive episodes: Secondary | ICD-10-CM | POA: Diagnosis not present

## 2017-07-25 DIAGNOSIS — Z9189 Other specified personal risk factors, not elsewhere classified: Secondary | ICD-10-CM | POA: Diagnosis not present

## 2017-07-25 DIAGNOSIS — K5909 Other constipation: Secondary | ICD-10-CM

## 2017-07-25 DIAGNOSIS — Z6835 Body mass index (BMI) 35.0-35.9, adult: Secondary | ICD-10-CM | POA: Diagnosis not present

## 2017-07-25 DIAGNOSIS — E66812 Obesity, class 2: Secondary | ICD-10-CM

## 2017-07-25 MED ORDER — POLYETHYLENE GLYCOL 3350 17 GM/SCOOP PO POWD: 17.0000 g | Freq: Every day | ORAL | 0 refills | Status: DC

## 2017-07-25 MED ORDER — METFORMIN HCL 500 MG PO TABS: 500.0000 mg | ORAL_TABLET | Freq: Two times a day (BID) | ORAL | 0 refills | Status: DC

## 2017-07-25 MED ORDER — BUPROPION HCL ER (SR) 150 MG PO TB12: 150.0000 mg | ORAL_TABLET | Freq: Two times a day (BID) | ORAL | 0 refills | Status: DC

## 2017-07-26 ENCOUNTER — Other Ambulatory Visit (INDEPENDENT_AMBULATORY_CARE_PROVIDER_SITE_OTHER): Payer: Self-pay | Admitting: Family Medicine

## 2017-07-26 DIAGNOSIS — E559 Vitamin D deficiency, unspecified: Secondary | ICD-10-CM

## 2017-07-27 DIAGNOSIS — M25611 Stiffness of right shoulder, not elsewhere classified: Secondary | ICD-10-CM | POA: Diagnosis not present

## 2017-07-27 DIAGNOSIS — M75121 Complete rotator cuff tear or rupture of right shoulder, not specified as traumatic: Secondary | ICD-10-CM | POA: Diagnosis not present

## 2017-07-27 DIAGNOSIS — M6281 Muscle weakness (generalized): Secondary | ICD-10-CM | POA: Diagnosis not present

## 2017-07-27 DIAGNOSIS — M25511 Pain in right shoulder: Secondary | ICD-10-CM | POA: Diagnosis not present

## 2017-07-27 MED ORDER — BUPROPION HCL ER (SR) 150 MG PO TB12: 150.0000 mg | ORAL_TABLET | Freq: Two times a day (BID) | ORAL | 0 refills | Status: DC

## 2017-07-27 NOTE — Progress Notes (Signed)
Office: [phone]  /  Fax: [phone]   HPI:   Chief Complaint: OBESITY [name] is here to discuss her progress with her obesity treatment plan. She is on the keep a food journal with 1400 to 1600 calories and 90+ grams of protein and is following her eating plan approximately 95 % of the time. She states she is walking for 60 minutes 3 to 4 times per week. [name] journaled for this last week. She had a GI bug before this, with vomiting and diarrhea. She states she is meeting her protein, but she is doing more guestimation. She is concentrating on carbs instead of calories.[name] Her weight is 233 lb (105.7 kg) today and has had a weight gain of 2 pounds over a period of 3 weeks since her last visit. She has lost 8 lbs since starting treatment with [place].  Pre-Diabetes [name] has a diagnosis of pre-diabetes and was informed this puts her at greater risk of developing diabetes. Her A1c is worsening but her fasting insulin improved. She is taking metformin currently and continues to work on diet and exercise to decrease risk of diabetes. She denies nausea or hypoglycemia.  At risk for diabetes [name] is at higher than average risk for developing diabetes due to her obesity and pre-diabetes. She currently denies polyuria or polydipsia.  Constipation [name] notes constipation improved on Miralax and requests a refill. She states BM are not hard and painful. She denies abdominal pain or rectal bleeding.   Depression with emotional eating behaviors [name] mood is stable on wellbutrin and she feels it helps with emotional eating.There is no improvement in insomnia. Her blood pressure is stable. [name] struggles with emotional eating and using food for comfort to the extent that it is negatively impacting her health. She often snacks when she is not hungry. [name] sometimes feels she is out of control and then feels guilty that she made poor food choices. She has been working on behavior  modification techniques to help reduce her emotional eating and has been somewhat successful. She shows no sign of suicidal or homicidal ideations.  Depression screen [facility] 2/9 09/28/2016 07/29/2015 09/22/2014  Decreased Interest 2 0 0  Down, Depressed, Hopeless 1 0 0  PHQ - 2 Score 3 0 0  Altered sleeping 1 - -  Tired, decreased energy 2 - -  Change in appetite 1 - -  Feeling bad or failure about yourself  1 - -  Trouble concentrating 2 - -  Moving slowly or fidgety/restless 1 - -  Suicidal thoughts 0 - -  PHQ-9 Score 11 - -  Some recent data might be hidden      ALLERGIES: Allergies  Allergen Reactions  . Chloraprep One Step [Chlorhexidine Gluconate] Rash    Developed severe rash where chloraprep was used on chest area  . Ivp Dye [Iodinated Diagnostic Agents] Rash and Other (See Comments)    Flushing, minor facial rash & dyspnea  . Nalbuphine Shortness Of Breath and Rash    Nubain caused respiratory distress & rash  . Septra [Sulfamethoxazole-Trimethoprim] Shortness Of Breath and Rash  . Sulfamethoxazole-Trimethoprim Rash    rash  . Tylox [Oxycodone-Acetaminophen] Rash    MEDICATIONS: Current Outpatient Medications on File Prior to Visit  Medication Sig Dispense Refill  . aspirin 81 MG tablet Take 81 mg by mouth daily.     . Fluticasone-Salmeterol (ADVAIR DISKUS) 100-50 MCG/DOSE AEPB Inhale 1 puff into the lungs 2 (two) times daily. 60 each 5  . gabapentin (NEURONTIN)  100 MG capsule Take 2 capsules (200 mg total) by mouth at bedtime. (Patient taking differently: Take 100 mg by mouth at bedtime. ) 60 capsule 3  . hydrochlorothiazide (MICROZIDE) 12.5 MG capsule Take 1 capsule (12.5 mg total) by mouth daily. 90 capsule 2  . HYDROcodone-acetaminophen (NORCO/VICODIN) 5-325 MG tablet Take 1-2 tablets every 6 (six) hours as needed by mouth for moderate pain. 30 tablet 0  . lisinopril (PRINIVIL,ZESTRIL) 40 MG tablet TAKE 1 TABLET EVERY DAY 90 tablet 2  . montelukast (SINGULAIR) 10 MG  tablet Take 1 tablet (10 mg total) by mouth at bedtime. 90 tablet 3  . topiramate (TOPAMAX) 25 MG tablet Take 25 mg by mouth 2 (two) times daily.    . Vitamin D, Ergocalciferol, (DRISDOL) 50000 units CAPS capsule Take 1 capsule (50,000 Units total) by mouth every 7 (seven) days. 4 capsule 0   No current facility-administered medications on file prior to visit.     PAST MEDICAL HISTORY: Past Medical History:  Diagnosis Date  . Anemia   . Anxiety   . Arthritis    inflammitory  . Asthma   . B12 deficiency   . Back pain   . Chronic joint pain   . Constipation   . DDD (degenerative disc disease), lumbar    low back and neck and shoulders  . Depression    during divorce & legal matters  . Fatty liver   . Fibromyalgia   . Gallbladder problem   . GERD (gastroesophageal reflux disease)   . History of kidney stones    Dr Phebe Colla  . History of polycystic ovarian disease S/P BSO  . Hx of anxiety disorder   . Hypertension    a  . Hypothyroidism    no meds  . IBS (irritable bowel syndrome)   . IBS (irritable bowel syndrome)   . Infertility, female   . Joint pain   . Kidney problem   . Lactose intolerance   . Lupus hx positive ANA   followed by Dr Gavin Pound; possible lupus  . Multiple food allergies   . Myalgia   . Osteoarthritis   . 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 POLYCYSTIC OVARIAN DISEASE 03/02/2007   Qualifier: Diagnosis of  By: Linna Darner MD, Rae Mar BSO for cysts & endometriosis; Dr Reino Kent, Gyn Seeing Dr Paula Compton    . PONV (postoperative nausea and vomiting)   . Prediabetes   . Sweating profusely   . Symptomatic mammary hypertrophy   . URI (upper respiratory infection)    currently on cefdinir    PAST SURGICAL HISTORY: Past Surgical History:  Procedure Laterality Date  . [place]   exploratory lap  . BREAST REDUCTION SURGERY Bilateral 03/26/2015   Procedure: BILATERAL BREAST REDUCTION ;  Surgeon: Wallace Going, DO;  Location: [place];  Service: Plastics;  Laterality: Bilateral;  . CARPAL TUNNEL RELEASE     bilateral; Dr Sherwood Gambler  . COLONOSCOPY     in 1990s  . CYSTO/ RIGHT RETROGRADE URETERAL PYELOGRAM  07-09-2004   HX BILATERAL RENAL STONES/ RIGHT FLANK PAIN  . CYSTOSCOPY W/ URETERAL STENT PLACEMENT  11/12/2011   Procedure: CYSTOSCOPY WITH RETROGRADE PYELOGRAM/URETERAL STENT PLACEMENT;  Surgeon: Alexis Frock, MD;  Location: [facility];  Service: Urology;  Laterality: Right;  . CYSTOSCOPY W/ URETERAL STENT REMOVAL  12/07/2011   Procedure: CYSTOSCOPY WITH STENT REMOVAL;  Surgeon: Alexis Frock, MD;  Location[place];  Service: Urology;  Laterality: Right;  . CYSTOSCOPY/RETROGRADE/URETEROSCOPY/STONE EXTRACTION WITH BASKET  12/07/2011   Procedure: CYSTOSCOPY/RETROGRADE/URETEROSCOPY/STONE EXTRACTION WITH BASKET;  Surgeon: Alexis Frock, MD;  Location: [facility];  Service: Urology;  Laterality: Right;  . DILATION AND CURETTAGE OF UTERUS     X5  . EXCISION LEFT BARTHOLIN GLAND  02-05-2002  . EYE SURGERY     Retinal tears surgery bilateral  . LAPAROSCOPIC ASSISTED VAGINAL HYSTERECTOMY  2000  . LAPAROSCOPIC CHOLECYSTECTOMY  05-31-2007  . LAPAROSCOPIC LASER ABLATION ENDOMETRIOSIS AND LEFT SALPINGO-OOPHORECTOMY  11-03-1999  . LAPAROSCOPY WITH RIGHT SALPINGO-OOPHECTOMY/ LYSIS ADHESIONS AND ABLATION ENDOMETRIOSIS  05-17-2001  . LIPOSUCTION Bilateral 03/26/2015   Procedure: WITH LIPOSUCTION;  Surgeon: Wallace Going, DO;  Location: [place];  Service: Plastics;  Laterality: Bilateral;  . LUMBAR LAMINECTOMY  2003   L4 - L5  . PLANTAR FASCIA SURGERY     right  . PLANTAR FASCIA SURGERY  02/2011   left  . RIGHT URETEROSCOPIC STONE EXTRACTION  08-04-2000  . TONSILLECTOMY    . URETERAL STENT PLACEMENT  11/12/11   Dr Tresa Moore    SOCIAL HISTORY: Social History   Tobacco Use  . Smoking status: Never Smoker  . Smokeless  tobacco: Never Used  Substance Use Topics  . Alcohol use: Yes    Alcohol/week: 0.6 oz    Types: 1 Shots of liquor per week    Comment:  very rarely  . Drug use: No    FAMILY HISTORY: Family History  Problem Relation Age of Onset  . Hypertension Mother   . Colon polyps Mother   . Atrial fibrillation Mother        Dr Angelena Form  . Hyperlipidemia Mother   . Heart disease Mother   . Anxiety disorder Mother   . Sleep apnea Mother   . Obesity Mother   . Heart attack Father 21  . Diabetes Father   . Hypertension Father   . Hyperlipidemia Father   . Heart disease Father   . Stroke Father   . Obesity Father   . Hypertension Sister   . Heart attack Paternal Grandmother 23  . Breast cancer Paternal Aunt   . Colon cancer Maternal Uncle   . Colon cancer Paternal Uncle   . Colon polyps Maternal Grandmother   . Stroke Maternal Grandmother 76  . Diabetes Paternal Grandfather   . Stroke Maternal Grandfather 37    ROS: Review of Systems  Constitutional: Negative for weight loss.  Gastrointestinal: Positive for constipation. Negative for abdominal pain and nausea.       Negative for rectal bleeding  Genitourinary: Negative for frequency.  Endo/Heme/Allergies: Negative for polydipsia.       Negative for hypoglycemia  Psychiatric/Behavioral: Positive for depression. Negative for suicidal ideas. The patient has insomnia.     PHYSICAL EXAM: Blood pressure 115/77, pulse 84, temperature 98.9 F (37.2 C), height 5\' 8"  (1.727 m), weight 233 lb (105.7 kg), SpO2 98 %. Body mass index is 35.43 kg/m. Physical Exam  Constitutional: She is oriented to person, place, and time. [name].  Cardiovascular: Normal rate.  Pulmonary/Chest: Effort normal.  Musculoskeletal: Normal range of motion.  Neurological: She is oriented to person, place, and time.  Skin: Skin is warm and dry.  Psychiatric: She has a normal mood and affect. Her behavior is normal.    Vitals reviewed.   RECENT LABS AND TESTS: BMET    Component Value Date/Time   NA 144  07/04/2017 1216   K 4.2 07/04/2017 1216   CL 108 (H) 07/04/2017 1216   CO2 22 07/04/2017 1216   GLUCOSE 74 07/04/2017 1216   GLUCOSE 92 08/16/2016 1011   BUN 24 07/04/2017 1216   CREATININE 0.76 07/04/2017 1216   CALCIUM 9.5 07/04/2017 1216   GFRNONAA 94 07/04/2017 1216   GFRAA 108 07/04/2017 1216   Lab Results  Component Value Date   HGBA1C 5.8 (H) 07/04/2017   HGBA1C 5.6 02/06/2017   HGBA1C 5.6 09/28/2016   HGBA1C 5.9 08/16/2016   HGBA1C 5.9 06/04/2015   Lab Results  Component Value Date   INSULIN 10.4 07/04/2017   INSULIN 28.1 (H) 02/06/2017   INSULIN 14.7 09/28/2016   CBC    Component Value Date/Time   WBC 7.5 07/04/2017 1216   WBC 6.6 08/16/2016 1011   RBC 4.80 07/04/2017 1216   RBC 4.68 08/16/2016 1011   HGB 13.0 07/04/2017 1216   HCT 40.3 07/04/2017 1216   PLT 235.0 08/16/2016 1011   MCV 84 07/04/2017 1216   MCH 27.1 07/04/2017 1216   MCH 27.0 04/19/2015 0650   MCHC 32.3 07/04/2017 1216   MCHC 33.4 08/16/2016 1011   RDW 15.2 07/04/2017 1216   LYMPHSABS 2.4 07/04/2017 1216   MONOABS 0.6 08/16/2016 1011   EOSABS 0.2 07/04/2017 1216   BASOSABS 0.0 07/04/2017 1216   Iron/TIBC/Ferritin/ %Sat No results found for: IRON, TIBC, FERRITIN, IRONPCTSAT Lipid Panel     Component Value Date/Time   CHOL 158 07/04/2017 1216   TRIG 69 07/04/2017 1216   HDL 60 07/04/2017 1216   CHOLHDL 3 08/16/2016 1011   VLDL 12.2 08/16/2016 1011   LDLCALC 84 07/04/2017 1216   Hepatic Function Panel     Component Value Date/Time   PROT 6.6 07/04/2017 1216   ALBUMIN 4.1 07/04/2017 1216   AST 15 07/04/2017 1216   ALT 21 07/04/2017 1216   ALKPHOS 98 07/04/2017 1216   BILITOT 0.3 07/04/2017 1216   BILIDIR 0.1 06/12/2013 1632   IBILI 0.2 06/11/2009 2309      Component Value Date/Time   TSH 2.100 07/04/2017 1216   TSH 1.54 08/16/2016 1011   TSH 2.40 06/04/2015 0857   Results for  ARIHANNA, ESTABROOK (MRN [id number]) as of 07/27/2017 09:34  Ref. Range 07/04/2017 12:16  Vitamin D, 25-Hydroxy Latest Ref Range: 30.0 - 100.0 [name]/mL 49.7   ASSESSMENT AND PLAN: Other constipation - Plan: polyethylene glycol powder (GLYCOLAX/MIRALAX) powder  Prediabetes - Plan: metFORMIN (GLUCOPHAGE) 500 MG tablet  Other depression - with emotional eating  - Plan: buPROPion (WELLBUTRIN SR) 150 MG 12 hr tablet  At risk for diabetes mellitus  Class 2 severe obesity with serious comorbidity and body mass index (BMI) of 35.0 to 35.9 in adult, unspecified obesity type ([place])  PLAN:  Pre-Diabetes Larri will continue to work on weight loss, exercise, and decreasing simple carbohydrates in her diet to help decrease the risk of diabetes. We dicussed metformin including benefits and risks. She was informed that eating too many simple carbohydrates or too many calories at one sitting increases the likelihood of GI side effects. [name] requested metformin for now and a prescription was written today for 1 month refill. [name] agreed to follow up with [place] as directed to monitor her progress.  Diabetes risk counseling [name] was given extended (15 minutes) diabetes prevention counseling today. She is 47 y.o. female and has risk factors for diabetes including obesity and pre-diabetes. We discussed intensive lifestyle modifications today with an emphasis on  weight loss as well as increasing exercise and decreasing simple carbohydrates in her diet.  Constipation [name] agreed to continue Miralax and we will refill for 1 month. She was advised to increase her H20 intake and work on increasing her fiber intake. High fiber foods were discussed today. [name] agreed to follow up as directed.  Depression with Emotional Eating Behaviors We discussed behavior modification techniques today to help [name] deal with her emotional eating and depression. She has agreed to take Wellbutrin SR 150 mg and we will  refill for 1 month. [name] agreed to follow up as directed.  Obesity [name] is currently in the action stage of change. As such, her goal is to continue with weight loss efforts She has agreed to keep a food journal with 1400 to 1600 calories and 90+ grams of protein  [name] has been instructed to work up to a goal of 150 minutes of combined cardio and strengthening exercise per week for weight loss and overall health benefits. We discussed the following Behavioral Modification Strategies today: keep a strict food journal, increasing vegetables and work on meal planning and easy cooking plans  [name] was advised we will recheck her BMP at the next visit if she is still gaining weight.  [name] has agreed to follow up with our clinic in 2 to 3 weeks. She was informed of the importance of frequent follow up visits to maximize her success with intensive lifestyle modifications for her multiple health conditions.   OBESITY BEHAVIORAL INTERVENTION VISIT  Today's visit was # 17 out of 22.  Starting weight: 241 lbs Starting date: 09/28/16 Today's weight : 233 lbs Today's date: 07/25/2017 Total lbs lost to date: 8 (Patients must lose 7 lbs in the first 6 months to continue with counseling)   ASK: We discussed the diagnosis of obesity with [name] today and [name] agreed to give [place] permission to discuss obesity behavioral modification therapy today.  ASSESS: [name] has the diagnosis of obesity and her BMI today is 35.44 [name] is in the action stage of change   ADVISE: [name] was educated on the multiple health risks of obesity as well as the benefit of weight loss to improve her health. She was advised of the need for long term treatment and the importance of lifestyle modifications.  AGREE: Multiple dietary modification options and treatment options were discussed and  [name] agreed to the above obesity treatment plan.  I, Doreene Nest, am acting as  transcriptionist for Dennard Nip, MD  I have reviewed the above documentation for accuracy and completeness, and I agree with the above. -Dennard Nip, MD

## 2017-07-31 DIAGNOSIS — M6281 Muscle weakness (generalized): Secondary | ICD-10-CM | POA: Diagnosis not present

## 2017-07-31 DIAGNOSIS — M25611 Stiffness of right shoulder, not elsewhere classified: Secondary | ICD-10-CM | POA: Diagnosis not present

## 2017-07-31 DIAGNOSIS — M75121 Complete rotator cuff tear or rupture of right shoulder, not specified as traumatic: Secondary | ICD-10-CM | POA: Diagnosis not present

## 2017-07-31 DIAGNOSIS — M25511 Pain in right shoulder: Secondary | ICD-10-CM | POA: Diagnosis not present

## 2017-08-03 DIAGNOSIS — M25611 Stiffness of right shoulder, not elsewhere classified: Secondary | ICD-10-CM | POA: Diagnosis not present

## 2017-08-03 DIAGNOSIS — M6281 Muscle weakness (generalized): Secondary | ICD-10-CM | POA: Diagnosis not present

## 2017-08-03 DIAGNOSIS — M75121 Complete rotator cuff tear or rupture of right shoulder, not specified as traumatic: Secondary | ICD-10-CM | POA: Diagnosis not present

## 2017-08-03 DIAGNOSIS — M25511 Pain in right shoulder: Secondary | ICD-10-CM | POA: Diagnosis not present

## 2017-08-07 DIAGNOSIS — M25511 Pain in right shoulder: Secondary | ICD-10-CM | POA: Diagnosis not present

## 2017-08-07 DIAGNOSIS — M25611 Stiffness of right shoulder, not elsewhere classified: Secondary | ICD-10-CM | POA: Diagnosis not present

## 2017-08-07 DIAGNOSIS — M75121 Complete rotator cuff tear or rupture of right shoulder, not specified as traumatic: Secondary | ICD-10-CM | POA: Diagnosis not present

## 2017-08-07 DIAGNOSIS — M6281 Muscle weakness (generalized): Secondary | ICD-10-CM | POA: Diagnosis not present

## 2017-08-09 DIAGNOSIS — M25611 Stiffness of right shoulder, not elsewhere classified: Secondary | ICD-10-CM | POA: Diagnosis not present

## 2017-08-09 DIAGNOSIS — M75121 Complete rotator cuff tear or rupture of right shoulder, not specified as traumatic: Secondary | ICD-10-CM | POA: Diagnosis not present

## 2017-08-09 DIAGNOSIS — M25511 Pain in right shoulder: Secondary | ICD-10-CM | POA: Diagnosis not present

## 2017-08-09 DIAGNOSIS — M6281 Muscle weakness (generalized): Secondary | ICD-10-CM | POA: Diagnosis not present

## 2017-08-14 ENCOUNTER — Ambulatory Visit (INDEPENDENT_AMBULATORY_CARE_PROVIDER_SITE_OTHER): Payer: PPO | Admitting: Family Medicine

## 2017-08-14 VITALS — BP 111/73 | HR 74 | Temp 98.1°F | Ht 68.0 in | Wt 235.0 lb

## 2017-08-14 DIAGNOSIS — R0602 Shortness of breath: Secondary | ICD-10-CM

## 2017-08-14 DIAGNOSIS — G4709 Other insomnia: Secondary | ICD-10-CM

## 2017-08-14 DIAGNOSIS — E559 Vitamin D deficiency, unspecified: Secondary | ICD-10-CM

## 2017-08-14 DIAGNOSIS — M75121 Complete rotator cuff tear or rupture of right shoulder, not specified as traumatic: Secondary | ICD-10-CM | POA: Diagnosis not present

## 2017-08-14 DIAGNOSIS — R7303 Prediabetes: Secondary | ICD-10-CM | POA: Diagnosis not present

## 2017-08-14 DIAGNOSIS — M6281 Muscle weakness (generalized): Secondary | ICD-10-CM | POA: Diagnosis not present

## 2017-08-14 DIAGNOSIS — Z6835 Body mass index (BMI) 35.0-35.9, adult: Secondary | ICD-10-CM | POA: Diagnosis not present

## 2017-08-14 DIAGNOSIS — M25611 Stiffness of right shoulder, not elsewhere classified: Secondary | ICD-10-CM | POA: Diagnosis not present

## 2017-08-14 DIAGNOSIS — M25511 Pain in right shoulder: Secondary | ICD-10-CM | POA: Diagnosis not present

## 2017-08-14 MED ORDER — METFORMIN HCL 500 MG PO TABS: 500.0000 mg | ORAL_TABLET | Freq: Two times a day (BID) | ORAL | 0 refills | Status: DC

## 2017-08-14 MED ORDER — TOPIRAMATE 50 MG PO TABS: 50.0000 mg | ORAL_TABLET | Freq: Every day | ORAL | 0 refills | Status: DC

## 2017-08-14 MED ORDER — VITAMIN D (ERGOCALCIFEROL) 1.25 MG (50000 UNIT) PO CAPS: 50000.0000 [IU] | ORAL_CAPSULE | ORAL | 0 refills | Status: DC

## 2017-08-15 ENCOUNTER — Encounter (INDEPENDENT_AMBULATORY_CARE_PROVIDER_SITE_OTHER): Payer: Self-pay

## 2017-08-15 ENCOUNTER — Telehealth (INDEPENDENT_AMBULATORY_CARE_PROVIDER_SITE_OTHER): Payer: Self-pay | Admitting: Family Medicine

## 2017-08-15 NOTE — Telephone Encounter (Signed)
Currently on 50mg  of toprimate from another MD, pt wants to know if she needs to go higher in mg  Pt numger [place]

## 2017-08-15 NOTE — Telephone Encounter (Signed)
Sent the pt a mychart message. April, CMA

## 2017-08-15 NOTE — Progress Notes (Signed)
Office: [phone]  /  Fax: [phone]   HPI:   Chief Complaint: OBESITY [name] is here to discuss her progress with her obesity treatment plan. She is on the  keep a food journal with 1400 to 1600 calories and 90+ grams of protein daily and is following her eating plan approximately 50 % of the time. She states she is doing cardio and walking for 30 minutes 7 times per week. [name] has basically maintained her weight for the last 10 months within 5 pounds. She is frustrated with her lack of continued weight loss, but is also struggling to keep a strict food journal and meet her calorie and protein goals. Her BMI repeated today was 2431. Her weight is 235 lb (106.6 kg) today and has had a weight gain of 2 pounds over a period of 3 weeks since her last visit. She has lost 6 lbs since starting treatment with [place].  Vitamin D deficiency [name] has a diagnosis of vitamin D deficiency. [name] is stable on vit D currently. There is no improvement in fatigue  and she denies nausea, vomiting or muscle weakness.  Dyspnea with Exercise [name] is doing physical therapy for her biceps repair, and still notes shortness of breath with activity, but not at rest. She is struggling with weight loss and has maintained her weight over the last six plus months or so. She has a history of asthma, but no recent exacerbations.[name] [name] has a diagnosis of prediabetes. Her A1c is still elevated at 5.8 and was informed this puts her at greater risk of developing diabetes. She is struggling to follow diet prescription and with weight loss. She states polyphagia has improved with metformin. She denies nausea, vomiting or hypoglycemia.  Insomnia [name] has a diagnosis of insomnia and states she was placed on Topiramate, but it hasn't helped.  ALLERGIES: Allergies  Allergen Reactions  . Chloraprep One Step [Chlorhexidine Gluconate] Rash    Developed severe rash where chloraprep was used  on chest area  . Ivp Dye [Iodinated Diagnostic Agents] Rash and Other (See Comments)    Flushing, minor facial rash & dyspnea  . Nalbuphine Shortness Of Breath and Rash    Nubain caused respiratory distress & rash  . Septra [Sulfamethoxazole-Trimethoprim] Shortness Of Breath and Rash  . Sulfamethoxazole-Trimethoprim Rash    rash  . Tylox [Oxycodone-Acetaminophen] Rash    MEDICATIONS: Current Outpatient Medications on File Prior to Visit  Medication Sig Dispense Refill  . aspirin 81 MG tablet Take 81 mg by mouth daily.     [name] buPROPion (WELLBUTRIN SR) 150 MG 12 hr tablet Take 1 tablet (150 mg total) by mouth 2 (two) times daily. 60 tablet 0  . Fluticasone-Salmeterol (ADVAIR DISKUS) 100-50 MCG/DOSE AEPB Inhale 1 puff into the lungs 2 (two) times daily. 60 each 5  . gabapentin (NEURONTIN) 100 MG capsule Take 2 capsules (200 mg total) by mouth at bedtime. (Patient taking differently: Take 100 mg by mouth at bedtime. ) 60 capsule 3  . hydrochlorothiazide (MICROZIDE) 12.5 MG capsule Take 1 capsule (12.5 mg total) by mouth daily. 90 capsule 2  . HYDROcodone-acetaminophen (NORCO/VICODIN) 5-325 MG tablet Take 1-2 tablets every 6 (six) hours as needed by mouth for moderate pain. 30 tablet 0  . lisinopril (PRINIVIL,ZESTRIL) 40 MG tablet TAKE 1 TABLET EVERY DAY 90 tablet 2  . montelukast (SINGULAIR) 10 MG tablet Take 1 tablet (10 mg total) by mouth at bedtime. 90 tablet 3  . polyethylene glycol powder (GLYCOLAX/MIRALAX) powder  Take 17 g by mouth daily. 3350 g 0   No current facility-administered medications on file prior to visit.     PAST MEDICAL HISTORY: Past Medical History:  Diagnosis Date  . Anemia   . Anxiety   . Arthritis    inflammitory  . Asthma   . B12 deficiency   . Back pain   . Chronic joint pain   . Constipation   . DDD (degenerative disc disease), lumbar    low back and neck and shoulders  . Depression    during divorce & legal matters  . Fatty liver   . Fibromyalgia     . Gallbladder problem   . GERD (gastroesophageal reflux disease)   . History of kidney stones    Dr Phebe Colla  . History of polycystic ovarian disease S/P BSO  . Hx of anxiety disorder   . Hypertension    a  . Hypothyroidism    no meds  . IBS (irritable bowel syndrome)   . IBS (irritable bowel syndrome)   . Infertility, female   . Joint pain   . Kidney problem   . Lactose intolerance   . Lupus hx positive ANA   followed by Dr Gavin Pound; possible lupus  . Multiple food allergies   . Myalgia   . Osteoarthritis   . 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 POLYCYSTIC OVARIAN DISEASE 03/02/2007   Qualifier: Diagnosis of  By: Linna Darner MD, Rae Mar BSO for cysts & endometriosis; Dr Reino Kent, Gyn Seeing Dr Paula Compton    . PONV (postoperative nausea and vomiting)   . Prediabetes   . Sweating profusely   . Symptomatic mammary hypertrophy   . URI (upper respiratory infection)    currently on cefdinir    PAST SURGICAL HISTORY: Past Surgical History:  Procedure Laterality Date  . [place]   exploratory lap  . BREAST REDUCTION SURGERY Bilateral 03/26/2015   Procedure: BILATERAL BREAST REDUCTION ;  Surgeon: Wallace Going, DO;  Location: [place];  Service: Plastics;  Laterality: Bilateral;  . CARPAL TUNNEL RELEASE     bilateral; Dr Sherwood Gambler  . COLONOSCOPY     in 1990s  . CYSTO/ RIGHT RETROGRADE URETERAL PYELOGRAM  07-09-2004   HX BILATERAL RENAL STONES/ RIGHT FLANK PAIN  . CYSTOSCOPY W/ URETERAL STENT PLACEMENT  11/12/2011   Procedure: CYSTOSCOPY WITH RETROGRADE PYELOGRAM/URETERAL STENT PLACEMENT;  Surgeon: Alexis Frock, MD;  Location: [facility];  Service: Urology;  Laterality: Right;  . CYSTOSCOPY W/ URETERAL STENT REMOVAL  12/07/2011   Procedure: CYSTOSCOPY WITH STENT REMOVAL;  Surgeon: Alexis Frock, MD;  Location[facility];  Service: Urology;  Laterality: Right;  .  CYSTOSCOPY/RETROGRADE/URETEROSCOPY/STONE EXTRACTION WITH BASKET  12/07/2011   Procedure: CYSTOSCOPY/RETROGRADE/URETEROSCOPY/STONE EXTRACTION WITH BASKET;  Surgeon: Alexis Frock, MD;  Location: [facility];  Service: Urology;  Laterality: Right;  . DILATION AND CURETTAGE OF UTERUS     X5  . EXCISION LEFT BARTHOLIN GLAND  02-05-2002  . EYE SURGERY     Retinal tears surgery bilateral  . LAPAROSCOPIC ASSISTED VAGINAL HYSTERECTOMY  2000  . LAPAROSCOPIC CHOLECYSTECTOMY  05-31-2007  . LAPAROSCOPIC LASER ABLATION ENDOMETRIOSIS AND LEFT SALPINGO-OOPHORECTOMY  11-03-1999  . LAPAROSCOPY WITH RIGHT SALPINGO-OOPHECTOMY/ LYSIS ADHESIONS AND ABLATION ENDOMETRIOSIS  05-17-2001  . LIPOSUCTION Bilateral 03/26/2015   Procedure: WITH LIPOSUCTION;  Surgeon: Wallace Going, DO;  Location: [place];  Service: Plastics;  Laterality:  Bilateral;  . LUMBAR LAMINECTOMY  2003   L4 - L5  . PLANTAR FASCIA SURGERY     right  . PLANTAR FASCIA SURGERY  02/2011   left  . RIGHT URETEROSCOPIC STONE EXTRACTION  08-04-2000  . TONSILLECTOMY    . URETERAL STENT PLACEMENT  11/12/11   Dr Tresa Moore    SOCIAL HISTORY: Social History   Tobacco Use  . Smoking status: Never Smoker  . Smokeless tobacco: Never Used  Substance Use Topics  . Alcohol use: Yes    Alcohol/week: 0.6 oz    Types: 1 Shots of liquor per week    Comment:  very rarely  . Drug use: No    FAMILY HISTORY: Family History  Problem Relation Age of Onset  . Hypertension Mother   . Colon polyps Mother   . Atrial fibrillation Mother        Dr Angelena Form  . Hyperlipidemia Mother   . Heart disease Mother   . Anxiety disorder Mother   . Sleep apnea Mother   . Obesity Mother   . Heart attack Father 42  . Diabetes Father   . Hypertension Father   . Hyperlipidemia Father   . Heart disease Father   . Stroke Father   . Obesity Father   . Hypertension Sister   . Heart attack Paternal Grandmother 105  . Breast cancer  Paternal Aunt   . Colon cancer Maternal Uncle   . Colon cancer Paternal Uncle   . Colon polyps Maternal Grandmother   . Stroke Maternal Grandmother 76  . Diabetes Paternal Grandfather   . Stroke Maternal Grandfather 37    ROS: Review of Systems  Constitutional: Positive for malaise/fatigue. Negative for weight loss.  Respiratory: Positive for shortness of breath (with activity).   Cardiovascular:       Negative for shortness of breath at rest  Gastrointestinal: Negative for nausea and vomiting.  Musculoskeletal:       Negative for muscle weakness  Endo/Heme/Allergies:       Negative for hypoglycemia  Psychiatric/Behavioral: The patient has insomnia.     PHYSICAL EXAM: Blood pressure 111/73, pulse 74, temperature 98.1 F (36.7 C), temperature source Oral, height 5\' 8"  (1.727 m), weight 235 lb (106.6 kg), SpO2 100 %. Body mass index is 35.73 kg/m. Physical Exam  Constitutional: She is oriented to person, place, and time. [name].  Cardiovascular: Normal rate.  Pulmonary/Chest: Effort normal.  Musculoskeletal: Normal range of motion.  Neurological: She is oriented to person, place, and time.  Skin: Skin is warm and dry.  Psychiatric: She has a normal mood and affect. Her behavior is normal.  Vitals reviewed.   RECENT LABS AND TESTS: BMET    Component Value Date/Time   NA 144 07/04/2017 1216   K 4.2 07/04/2017 1216   CL 108 (H) 07/04/2017 1216   CO2 22 07/04/2017 1216   GLUCOSE 74 07/04/2017 1216   GLUCOSE 92 08/16/2016 1011   BUN 24 07/04/2017 1216   CREATININE 0.76 07/04/2017 1216   CALCIUM 9.5 07/04/2017 1216   GFRNONAA 94 07/04/2017 1216   GFRAA 108 07/04/2017 1216   Lab Results  Component Value Date   HGBA1C 5.8 (H) 07/04/2017   HGBA1C 5.6 02/06/2017   HGBA1C 5.6 09/28/2016   HGBA1C 5.9 08/16/2016   HGBA1C 5.9 06/04/2015   Lab Results  Component Value Date   INSULIN 10.4 07/04/2017   INSULIN 28.1 (H) 02/06/2017     INSULIN 14.7 09/28/2016   CBC  Component Value Date/Time   WBC 7.5 07/04/2017 1216   WBC 6.6 08/16/2016 1011   RBC 4.80 07/04/2017 1216   RBC 4.68 08/16/2016 1011   HGB 13.0 07/04/2017 1216   HCT 40.3 07/04/2017 1216   PLT 235.0 08/16/2016 1011   MCV 84 07/04/2017 1216   MCH 27.1 07/04/2017 1216   MCH 27.0 04/19/2015 0650   MCHC 32.3 07/04/2017 1216   MCHC 33.4 08/16/2016 1011   RDW 15.2 07/04/2017 1216   LYMPHSABS 2.4 07/04/2017 1216   MONOABS 0.6 08/16/2016 1011   EOSABS 0.2 07/04/2017 1216   BASOSABS 0.0 07/04/2017 1216   Iron/TIBC/Ferritin/ %Sat No results found for: IRON, TIBC, FERRITIN, IRONPCTSAT Lipid Panel     Component Value Date/Time   CHOL 158 07/04/2017 1216   TRIG 69 07/04/2017 1216   HDL 60 07/04/2017 1216   CHOLHDL 3 08/16/2016 1011   VLDL 12.2 08/16/2016 1011   LDLCALC 84 07/04/2017 1216   Hepatic Function Panel     Component Value Date/Time   PROT 6.6 07/04/2017 1216   ALBUMIN 4.1 07/04/2017 1216   AST 15 07/04/2017 1216   ALT 21 07/04/2017 1216   ALKPHOS 98 07/04/2017 1216   BILITOT 0.3 07/04/2017 1216   BILIDIR 0.1 06/12/2013 1632   IBILI 0.2 06/11/2009 2309      Component Value Date/Time   TSH 2.100 07/04/2017 1216   TSH 1.54 08/16/2016 1011   TSH 2.40 06/04/2015 0857   Results for NEVAE, PINNIX (MRN [id number]) as of 08/15/2017 10:35  Ref. Range 07/04/2017 12:16  Vitamin D, 25-Hydroxy Latest Ref Range: 30.0 - 100.0 ng/mL 49.7   ASSESSMENT AND PLAN: Shortness of breath on exertion  Prediabetes - Plan: metFORMIN (GLUCOPHAGE) 500 MG tablet  Vitamin D deficiency - Plan: Vitamin D, Ergocalciferol, (DRISDOL) 50000 units CAPS capsule  Other insomnia - Plan: topiramate (TOPAMAX) 50 MG tablet  Class 2 severe obesity with serious comorbidity and body mass index (BMI) of 35.0 to 35.9 in adult, unspecified obesity type ([place])  PLAN:  Vitamin D Deficiency Elisabetta was informed that low vitamin D levels contributes to fatigue and are  associated with obesity, breast, and colon cancer. She agrees to continue to take prescription Vit D @50 ,000 IU every week #4 with no refills and will follow up for routine testing of vitamin D, at least 2-3 times per year. She was informed of the risk of over-replacement of vitamin D and agrees to not increase her dose unless she discusses this with [place] first. Keirstan agrees to follow up with our clinic in 2 to 3 weeks.  Dyspnea with exercise Caydee's shortness of breath is likely due to obesity related hypoventilation. Indirect calorimetry ordered today showed BMR is still good at 2431. Janessa agrees to follow up with our clinic in 2 to 3 weeks.  Pre-Diabetes Jalia will continue to work on weight loss, exercise, and decreasing simple carbohydrates in her diet to help decrease the risk of diabetes. We dicussed metformin including benefits and risks. She was informed that eating too many simple carbohydrates or too many calories at one sitting increases the likelihood of GI side effects. Calle requested metformin for now and a prescription was written today for 1 month refill. Ladeidra agreed to follow up with [place] as directed to monitor her progress.  Insomnia Sacha agrees to increase Topiramate to 50 mg 1 1/2 tablets qhs #45 with no refills and follow up with our clinic in 2 to 3 weeks.  Obesity Violetta is currently in the  action stage of change. As such, her goal is to continue with weight loss efforts She has agreed to keep a food journal with 1500 calories and 85 grams of protein daily Elleanna has been instructed to work up to a goal of 150 minutes of combined cardio and strengthening exercise per week for weight loss and overall health benefits. We discussed the following Behavioral Modification Strategies today: increasing lean protein intake and decreasing simple carbohydrates   Edmund has agreed to follow up with our clinic in 2 to 3 weeks. She was informed of the importance  of frequent follow up visits to maximize her success with intensive lifestyle modifications for her multiple health conditions.   OBESITY BEHAVIORAL INTERVENTION VISIT  Today's visit was # 18 out of 22.  Starting weight: 241 lbs Starting date: 09/28/16 Today's weight : 235 lbs Today's date: 08/14/2017 Total lbs lost to date: 6 (Patients must lose 7 lbs in the first 6 months to continue with counseling)   ASK: We discussed the diagnosis of obesity with Anne Ng today and Nahima agreed to give [place] permission to discuss obesity behavioral modification therapy today.  ASSESS: Masha has the diagnosis of obesity and her BMI today is 35.74 Nanci is in the action stage of change   ADVISE: Kianna was educated on the multiple health risks of obesity as well as the benefit of weight loss to improve her health. She was advised of the need for long term treatment and the importance of lifestyle modifications.  AGREE: Multiple dietary modification options and treatment options were discussed and  Deatra agreed to the above obesity treatment plan.  I, Doreene Nest, am acting as transcriptionist for Dennard Nip, MD  I have reviewed the above documentation for accuracy and completeness, and I agree with the above. -Dennard Nip, MD

## 2017-08-17 ENCOUNTER — Ambulatory Visit (INDEPENDENT_AMBULATORY_CARE_PROVIDER_SITE_OTHER): Payer: PPO | Admitting: Internal Medicine

## 2017-08-17 ENCOUNTER — Ambulatory Visit: Payer: Self-pay

## 2017-08-17 ENCOUNTER — Encounter: Payer: Self-pay | Admitting: Internal Medicine

## 2017-08-17 VITALS — BP 124/86 | HR 91 | Temp 99.0°F | Resp 16 | Wt 239.0 lb

## 2017-08-17 DIAGNOSIS — I10 Essential (primary) hypertension: Secondary | ICD-10-CM

## 2017-08-17 DIAGNOSIS — F3289 Other specified depressive episodes: Secondary | ICD-10-CM | POA: Diagnosis not present

## 2017-08-17 DIAGNOSIS — M25511 Pain in right shoulder: Secondary | ICD-10-CM | POA: Diagnosis not present

## 2017-08-17 DIAGNOSIS — K219 Gastro-esophageal reflux disease without esophagitis: Secondary | ICD-10-CM | POA: Diagnosis not present

## 2017-08-17 DIAGNOSIS — R7303 Prediabetes: Secondary | ICD-10-CM

## 2017-08-17 DIAGNOSIS — M75121 Complete rotator cuff tear or rupture of right shoulder, not specified as traumatic: Secondary | ICD-10-CM | POA: Diagnosis not present

## 2017-08-17 DIAGNOSIS — R61 Generalized hyperhidrosis: Secondary | ICD-10-CM

## 2017-08-17 DIAGNOSIS — F419 Anxiety disorder, unspecified: Secondary | ICD-10-CM | POA: Insufficient documentation

## 2017-08-17 DIAGNOSIS — E039 Hypothyroidism, unspecified: Secondary | ICD-10-CM

## 2017-08-17 DIAGNOSIS — M25611 Stiffness of right shoulder, not elsewhere classified: Secondary | ICD-10-CM | POA: Diagnosis not present

## 2017-08-17 DIAGNOSIS — M533 Sacrococcygeal disorders, not elsewhere classified: Secondary | ICD-10-CM | POA: Diagnosis not present

## 2017-08-17 DIAGNOSIS — M6281 Muscle weakness (generalized): Secondary | ICD-10-CM | POA: Diagnosis not present

## 2017-08-17 MED ORDER — RANITIDINE HCL 150 MG PO TABS: 150.0000 mg | ORAL_TABLET | Freq: Every day | ORAL | 3 refills | Status: DC

## 2017-08-17 MED ORDER — CITALOPRAM HYDROBROMIDE 20 MG PO TABS: 20.0000 mg | ORAL_TABLET | Freq: Every day | ORAL | 1 refills | Status: DC

## 2017-08-17 MED ORDER — FLUTICASONE-SALMETEROL 100-50 MCG/DOSE IN AEPB: 1.0000 | INHALATION_SPRAY | Freq: Two times a day (BID) | RESPIRATORY_TRACT | 5 refills | Status: DC

## 2017-08-17 MED ORDER — GLYCOPYRROLATE 1 MG PO TABS: 1.0000 mg | ORAL_TABLET | Freq: Two times a day (BID) | ORAL | 5 refills | Status: DC

## 2017-08-17 NOTE — Progress Notes (Signed)
Subjective:    Patient ID: [name], female    DOB: [birth year], 47 y.o.   MRN: [id number]  HPI The patient is here for follow up.  She had shoulder surgery a few weeks ago and is doing PT.    She is still following with the weight management clinic - she was told she was still dehydrated and needs to eat more to help with weight loss.   Hyperhidrosis:  For a while she has had excessive sweating over her entire body but mostly in her head and neck.  It bothers her quality of life and she is interested in medication.    Hypertension: She is taking her medication daily. She is compliant with a low sodium diet.  She denies chest pain, palpitations, edema, shortness of breath and regular headaches. She is exercising regularly.      Hypothyroidism:  She is taking her medication daily.  She denies any recent changes in energy or weight that are unexplained.   Prediabetes:  She is compliant with a low sugar/carbohydrate diet.  She is exercising regularly.  Anxiety;  She does have increased anxiety and is interested in restarting celexa.  She did well with that in the past.    GERD:  She is having increased GERD.   She thinks it is from eating too much broccoli.  She is not on any medication now.   Medications and allergies reviewed with patient and updated if appropriate.  Patient Active Problem List   Diagnosis Date Noted  . Other insomnia 06/06/2017  . Depression 05/16/2017  . Umbilical hernia 22/97/9892  . Diverticulosis of colon 02/26/2017  . Hematuria 02/16/2017  . Abdominal pain 02/16/2017  . Other hyperlipidemia 02/06/2017  . Asthma exacerbation, mild 11/24/2016  . Vitamin D deficiency 11/09/2016  . Class 2 obesity with serious comorbidity and body mass index (BMI) of 35.0 to 35.9 in adult 11/09/2016  . Right shoulder pain 10/11/2016  . AC (acromioclavicular) joint arthritis 09/13/2016  . Postsurgical menopause 08/16/2016  . Mucoid cyst of joint 11/04/2015  .  External hemorrhoid 08/20/2015  . Prediabetes 06/05/2015  . Status post bilateral breast reduction 04/01/2015  . Cervical disc disorder with radiculopathy of cervical region 05/23/2014  . Neck pain 02/03/2014  . Ulnar neuropathy 01/16/2014  . Fibromyalgia 01/16/2014  . Recurrent nephrolithiasis 04/29/2013  . Arthralgia 04/29/2013  . Panic attacks 01/15/2013  . IBS (irritable bowel syndrome) 12/14/2011  . B12 deficiency 06/30/2009  . Hypothyroidism 03/02/2007  . Essential hypertension 03/02/2007    Current Outpatient Medications on File Prior to Visit  Medication Sig Dispense Refill  . aspirin 81 MG tablet Take 81 mg by mouth daily.     [name] buPROPion (WELLBUTRIN SR) 150 MG 12 hr tablet Take 1 tablet (150 mg total) by mouth 2 (two) times daily. 60 tablet 0  . Fluticasone-Salmeterol (ADVAIR DISKUS) 100-50 MCG/DOSE AEPB Inhale 1 puff into the lungs 2 (two) times daily. 60 each 5  . hydrochlorothiazide (MICROZIDE) 12.5 MG capsule Take 1 capsule (12.5 mg total) by mouth daily. 90 capsule 2  . lisinopril (PRINIVIL,ZESTRIL) 40 MG tablet TAKE 1 TABLET EVERY DAY 90 tablet 2  . metFORMIN (GLUCOPHAGE) 500 MG tablet Take 1 tablet (500 mg total) by mouth 2 (two) times daily. 60 tablet 0  . montelukast (SINGULAIR) 10 MG tablet Take 1 tablet (10 mg total) by mouth at bedtime. 90 tablet 3  . polyethylene glycol powder (GLYCOLAX/MIRALAX) powder Take 17 g by mouth daily. 3350 g  0  . topiramate (TOPAMAX) 50 MG tablet Take 1 tablet (50 mg total) by mouth daily. (Patient taking differently: Take 50 mg by mouth daily. Take 1 1/2 daily) 30 tablet 0  . Vitamin D, Ergocalciferol, (DRISDOL) 50000 units CAPS capsule Take 1 capsule (50,000 Units total) by mouth every 7 (seven) days. 4 capsule 0   No current facility-administered medications on file prior to visit.     Past Medical History:  Diagnosis Date  . Anemia   . Anxiety   . Arthritis    inflammitory  . Asthma   . B12 deficiency   . Back pain   .  Chronic joint pain   . Constipation   . DDD (degenerative disc disease), lumbar    low back and neck and shoulders  . Depression    during divorce & legal matters  . Fatty liver   . Fibromyalgia   . Gallbladder problem   . GERD (gastroesophageal reflux disease)   . History of kidney stones    Dr Phebe Colla  . History of polycystic ovarian disease S/P BSO  . Hx of anxiety disorder   . Hypertension    a  . Hypothyroidism    no meds  . IBS (irritable bowel syndrome)   . IBS (irritable bowel syndrome)   . Infertility, female   . Joint pain   . Kidney problem   . Lactose intolerance   . Lupus (HCC) hx positive ANA   followed by Dr Gavin Pound; possible lupus  . Multiple food allergies   . Myalgia   . Osteoarthritis   . 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 POLYCYSTIC OVARIAN DISEASE 03/02/2007   Qualifier: Diagnosis of  By: Linna Darner MD, Rae Mar BSO for cysts & endometriosis; Dr Reino Kent, Gyn Seeing Dr Paula Compton    . PONV (postoperative nausea and vomiting)   . Prediabetes   . Sweating profusely   . Symptomatic mammary hypertrophy   . URI (upper respiratory infection)    currently on cefdinir    Past Surgical History:  Procedure Laterality Date  . [place]   exploratory lap  . BREAST REDUCTION SURGERY Bilateral 03/26/2015   Procedure: BILATERAL BREAST REDUCTION ;  Surgeon: Wallace Going, DO;  Location: [facility];  Service: Plastics;  Laterality: Bilateral;  . CARPAL TUNNEL RELEASE     bilateral; Dr Sherwood Gambler  . COLONOSCOPY     in 1990s  . CYSTO/ RIGHT RETROGRADE URETERAL PYELOGRAM  07-09-2004   HX BILATERAL RENAL STONES/ RIGHT FLANK PAIN  . CYSTOSCOPY W/ URETERAL STENT PLACEMENT  11/12/2011   Procedure: CYSTOSCOPY WITH RETROGRADE PYELOGRAM/URETERAL STENT PLACEMENT;  Surgeon: Alexis Frock, MD;  Location: [facility];  Service: Urology;  Laterality: Right;  . CYSTOSCOPY W/ URETERAL STENT REMOVAL  12/07/2011    Procedure: CYSTOSCOPY WITH STENT REMOVAL;  Surgeon: Alexis Frock, MD;  Location[facility];  Service: Urology;  Laterality: Right;  . CYSTOSCOPY/RETROGRADE/URETEROSCOPY/STONE EXTRACTION WITH BASKET  12/07/2011   Procedure: CYSTOSCOPY/RETROGRADE/URETEROSCOPY/STONE EXTRACTION WITH BASKET;  Surgeon: Alexis Frock, MD;  Location: [facility];  Service: Urology;  Laterality: Right;  . DILATION AND CURETTAGE OF UTERUS     X5  . EXCISION LEFT BARTHOLIN GLAND  02-05-2002  . EYE SURGERY     Retinal tears surgery bilateral  . LAPAROSCOPIC ASSISTED VAGINAL HYSTERECTOMY  2000  . LAPAROSCOPIC CHOLECYSTECTOMY  05-31-2007  . LAPAROSCOPIC LASER ABLATION ENDOMETRIOSIS AND LEFT  SALPINGO-OOPHORECTOMY  11-03-1999  . LAPAROSCOPY WITH RIGHT SALPINGO-OOPHECTOMY/ LYSIS ADHESIONS AND ABLATION ENDOMETRIOSIS  05-17-2001  . LIPOSUCTION Bilateral 03/26/2015   Procedure: WITH LIPOSUCTION;  Surgeon: Wallace Going, DO;  Location: [facility];  Service: Plastics;  Laterality: Bilateral;  . LUMBAR LAMINECTOMY  2003   L4 - L5  . PLANTAR FASCIA SURGERY     right  . PLANTAR FASCIA SURGERY  02/2011   left  . RIGHT URETEROSCOPIC STONE EXTRACTION  08-04-2000  . TONSILLECTOMY    . URETERAL STENT PLACEMENT  11/12/11   Dr Tresa Moore    Social History   Socioeconomic History  . Marital status: Married    Spouse name: Darnelle Maffucci  . Number of children: 0  . Years of education: college  . Highest education level: Not on file  Occupational History  . Occupation: disabled  Social Needs  . Financial resource strain: Not on file  . Food insecurity:    Worry: Not on file    Inability: Not on file  . Transportation needs:    Medical: Not on file    Non-medical: Not on file  Tobacco Use  . Smoking status: Never Smoker  . Smokeless tobacco: Never Used  Substance and Sexual Activity  . Alcohol use: Yes    Alcohol/week: 0.6 oz    Types: 1 Shots of liquor per week    Comment:   very rarely  . Drug use: No  . Sexual activity: Yes    Birth control/protection: Surgical  Lifestyle  . Physical activity:    Days per week: Not on file    Minutes per session: Not on file  . Stress: Not on file  Relationships  . Social connections:    Talks on phone: Not on file    Gets together: Not on file    Attends religious service: Not on file    Active member of club or organization: Not on file    Attends meetings of clubs or organizations: Not on file    Relationship status: Not on file  Other Topics Concern  . Not on file  Social History Narrative   Patient Lives at home with her husband Darnelle Maffucci)   Disabled.   Education two years of college.   Right handed.   Caffeine coffee and sweet tea. Not daily.    Family History  Problem Relation Age of Onset  . Hypertension Mother   . Colon polyps Mother   . Atrial fibrillation Mother        Dr Angelena Form  . Hyperlipidemia Mother   . Heart disease Mother   . Anxiety disorder Mother   . Sleep apnea Mother   . Obesity Mother   . Heart attack Father 69  . Diabetes Father   . Hypertension Father   . Hyperlipidemia Father   . Heart disease Father   . Stroke Father   . Obesity Father   . Hypertension Sister   . Heart attack Paternal Grandmother 92  . Breast cancer Paternal Aunt   . Colon cancer Maternal Uncle   . Colon cancer Paternal Uncle   . Colon polyps Maternal Grandmother   . Stroke Maternal Grandmother 76  . Diabetes Paternal Grandfather   . Stroke Maternal Grandfather 37    Review of Systems  Constitutional: Positive for diaphoresis (excessive daily). Negative for chills and fever.  Respiratory: Positive for cough (dry at night - ?pollen). Negative for shortness of breath and wheezing.   Cardiovascular: Negative for chest pain, palpitations  and leg swelling.  Gastrointestinal:       Gerd at night  Neurological: Positive for dizziness (occ). Negative for light-headedness and headaches.    Psychiatric/Behavioral: Negative for dysphoric mood. The patient is nervous/anxious.        Objective:   Vitals:   08/17/17 0942  BP: 124/86  Pulse: 91  Resp: 16  Temp: 99 F (37.2 C)  SpO2: 98%   BP Readings from Last 3 Encounters:  08/17/17 124/86  08/14/17 111/73  07/25/17 115/77   Wt Readings from Last 3 Encounters:  08/17/17 239 lb (108.4 kg)  08/14/17 235 lb (106.6 kg)  07/25/17 233 lb (105.7 kg)   Body mass index is 36.34 kg/m.   Physical Exam    Constitutional: Appears well-developed and well-nourished. No distress.  HENT:  Head: Normocephalic and atraumatic.  Neck: Neck supple. No tracheal deviation present. No thyromegaly present.  No cervical lymphadenopathy Cardiovascular: Normal rate, regular rhythm and normal heart sounds.   No murmur heard. No carotid bruit .  No edema Pulmonary/Chest: Effort normal and breath sounds normal. No respiratory distress. No has no wheezes. No rales.  Skin: Skin is warm and dry. Not diaphoretic.  Psychiatric: Normal mood and affect. Behavior is normal.      Assessment & Plan:    See Problem List for Assessment and Plan of chronic medical problems.

## 2017-08-17 NOTE — Assessment & Plan Note (Signed)
Controlled Continue wellbutrin Starting celexa for anxiety

## 2017-08-17 NOTE — Assessment & Plan Note (Signed)
Increased anxiety Restart celexa

## 2017-08-17 NOTE — Assessment & Plan Note (Signed)
Check a1c Low sugar / carb diet Stressed regular exercise   

## 2017-08-17 NOTE — Assessment & Plan Note (Signed)
Excessive sweating, had it for years Occurs daily, even without activity Affecting quality of life Will try glycopyrrolate - can increase dose if needed If not effective will consider clonidine and d/c hctz

## 2017-08-17 NOTE — Assessment & Plan Note (Signed)
Check tsh  Titrate med dose if needed  

## 2017-08-17 NOTE — Assessment & Plan Note (Signed)
BP Readings from Last 3 Encounters:  08/17/17 124/86  08/14/17 111/73  07/25/17 115/77    BP well controlled Current regimen effective and well tolerated Continue current medications at current doses

## 2017-08-17 NOTE — Assessment & Plan Note (Signed)
Increased recently Zantac 150 mg at night

## 2017-08-17 NOTE — Patient Instructions (Addendum)
   Medications reviewed and updated.  Changes include starting celexa 20 mg daily, glycopyrolate for your excessive sweating.  Start generic zantac for heartburn.     Your prescription(s) have been submitted to your pharmacy. Please take as directed and contact our office if you believe you are having problem(s) with the medication(s).    Please followup in 6 months

## 2017-08-21 DIAGNOSIS — M25611 Stiffness of right shoulder, not elsewhere classified: Secondary | ICD-10-CM | POA: Diagnosis not present

## 2017-08-21 DIAGNOSIS — M6281 Muscle weakness (generalized): Secondary | ICD-10-CM | POA: Diagnosis not present

## 2017-08-21 DIAGNOSIS — M75121 Complete rotator cuff tear or rupture of right shoulder, not specified as traumatic: Secondary | ICD-10-CM | POA: Diagnosis not present

## 2017-08-21 DIAGNOSIS — M25511 Pain in right shoulder: Secondary | ICD-10-CM | POA: Diagnosis not present

## 2017-08-22 ENCOUNTER — Other Ambulatory Visit (INDEPENDENT_AMBULATORY_CARE_PROVIDER_SITE_OTHER): Payer: Self-pay | Admitting: Family Medicine

## 2017-08-22 DIAGNOSIS — R7303 Prediabetes: Secondary | ICD-10-CM

## 2017-08-24 DIAGNOSIS — M25611 Stiffness of right shoulder, not elsewhere classified: Secondary | ICD-10-CM | POA: Diagnosis not present

## 2017-08-24 DIAGNOSIS — M6281 Muscle weakness (generalized): Secondary | ICD-10-CM | POA: Diagnosis not present

## 2017-08-24 DIAGNOSIS — M25511 Pain in right shoulder: Secondary | ICD-10-CM | POA: Diagnosis not present

## 2017-08-24 DIAGNOSIS — M75121 Complete rotator cuff tear or rupture of right shoulder, not specified as traumatic: Secondary | ICD-10-CM | POA: Diagnosis not present

## 2017-08-25 ENCOUNTER — Other Ambulatory Visit: Payer: Self-pay | Admitting: Internal Medicine

## 2017-08-25 ENCOUNTER — Encounter: Payer: Self-pay | Admitting: Internal Medicine

## 2017-08-25 NOTE — Progress Notes (Signed)
Abstracted and sent to scan  

## 2017-08-29 DIAGNOSIS — M75121 Complete rotator cuff tear or rupture of right shoulder, not specified as traumatic: Secondary | ICD-10-CM | POA: Diagnosis not present

## 2017-08-29 DIAGNOSIS — M6281 Muscle weakness (generalized): Secondary | ICD-10-CM | POA: Diagnosis not present

## 2017-08-29 DIAGNOSIS — M25511 Pain in right shoulder: Secondary | ICD-10-CM | POA: Diagnosis not present

## 2017-08-29 DIAGNOSIS — M25611 Stiffness of right shoulder, not elsewhere classified: Secondary | ICD-10-CM | POA: Diagnosis not present

## 2017-08-31 DIAGNOSIS — M75121 Complete rotator cuff tear or rupture of right shoulder, not specified as traumatic: Secondary | ICD-10-CM | POA: Diagnosis not present

## 2017-08-31 DIAGNOSIS — M25611 Stiffness of right shoulder, not elsewhere classified: Secondary | ICD-10-CM | POA: Diagnosis not present

## 2017-08-31 DIAGNOSIS — M25511 Pain in right shoulder: Secondary | ICD-10-CM | POA: Diagnosis not present

## 2017-08-31 DIAGNOSIS — M6281 Muscle weakness (generalized): Secondary | ICD-10-CM | POA: Diagnosis not present

## 2017-09-03 ENCOUNTER — Other Ambulatory Visit (INDEPENDENT_AMBULATORY_CARE_PROVIDER_SITE_OTHER): Payer: Self-pay | Admitting: Family Medicine

## 2017-09-03 DIAGNOSIS — E559 Vitamin D deficiency, unspecified: Secondary | ICD-10-CM

## 2017-09-04 ENCOUNTER — Ambulatory Visit (INDEPENDENT_AMBULATORY_CARE_PROVIDER_SITE_OTHER): Payer: PPO | Admitting: Family Medicine

## 2017-09-04 VITALS — BP 119/73 | HR 87 | Temp 98.3°F | Ht 68.0 in | Wt 234.0 lb

## 2017-09-04 DIAGNOSIS — F418 Other specified anxiety disorders: Secondary | ICD-10-CM | POA: Diagnosis not present

## 2017-09-04 DIAGNOSIS — Z6835 Body mass index (BMI) 35.0-35.9, adult: Secondary | ICD-10-CM

## 2017-09-04 DIAGNOSIS — M25511 Pain in right shoulder: Secondary | ICD-10-CM | POA: Diagnosis not present

## 2017-09-04 DIAGNOSIS — M75121 Complete rotator cuff tear or rupture of right shoulder, not specified as traumatic: Secondary | ICD-10-CM | POA: Diagnosis not present

## 2017-09-04 DIAGNOSIS — M6281 Muscle weakness (generalized): Secondary | ICD-10-CM | POA: Diagnosis not present

## 2017-09-04 DIAGNOSIS — M25611 Stiffness of right shoulder, not elsewhere classified: Secondary | ICD-10-CM | POA: Diagnosis not present

## 2017-09-04 DIAGNOSIS — E66812 Obesity, class 2: Secondary | ICD-10-CM

## 2017-09-04 DIAGNOSIS — R7303 Prediabetes: Secondary | ICD-10-CM

## 2017-09-04 MED ORDER — BUPROPION HCL ER (SR) 150 MG PO TB12: 150.0000 mg | ORAL_TABLET | Freq: Two times a day (BID) | ORAL | 0 refills | Status: DC

## 2017-09-04 MED ORDER — TOPIRAMATE 50 MG PO TABS: 50.0000 mg | ORAL_TABLET | Freq: Every day | ORAL | 0 refills | Status: DC

## 2017-09-04 NOTE — Progress Notes (Signed)
Office: [phone]  /  Fax: [phone]   HPI:   Chief Complaint: OBESITY [name] is here to discuss her progress with her obesity treatment plan. She is on the keep a food journal with 1500 calories and 85 grams of protein daily and is following her eating plan approximately 100 % of the time. She states she is walking for 30-45 minutes 6 times per week. [name] states she is journaling every day and exercising >150 minutes per week and her daily kcal is approximately 1600. She is working on meeting her protein goals but she has had to guess at a lot of the calories. Her RMR is above average as measured last visit so she is likely missing some calories.  Her weight is 234 lb (106.1 kg) today and has had a weight loss of 1 pound over a period of 3 weeks since her last visit. She has lost 7 lbs since starting treatment with [place].  Depression with Anxiety [name] is followed by her therapist, stable on Wellbutrin and topiramate but still frustrated by her lack of success with weight loss. [name] struggles with emotional eating and using food for comfort to the extent that it is negatively impacting her health. She often snacks when she is not hungry. [name] sometimes feels she is out of control and then feels guilty that she made poor food choices. She has been working on behavior modification techniques to help reduce her emotional eating and has been somewhat successful. She shows no sign of suicidal or homicidal ideations.  Depression screen [facility] 2/9 09/28/2016 07/29/2015 09/22/2014  Decreased Interest 2 0 0  Down, Depressed, Hopeless 1 0 0  PHQ - 2 Score 3 0 0  Altered sleeping 1 - -  Tired, decreased energy 2 - -  Change in appetite 1 - -  Feeling bad or failure about yourself  1 - -  Trouble concentrating 2 - -  Moving slowly or fidgety/restless 1 - -  Suicidal thoughts 0 - -  PHQ-9 Score 11 - -  Some recent data might be hidden    ALLERGIES: Allergies  Allergen Reactions    . Chloraprep One Step [Chlorhexidine Gluconate] Rash    Developed severe rash where chloraprep was used on chest area  . Ivp Dye [Iodinated Diagnostic Agents] Rash and Other (See Comments)    Flushing, minor facial rash & dyspnea  . Nalbuphine Shortness Of Breath and Rash    Nubain caused respiratory distress & rash  . Septra [Sulfamethoxazole-Trimethoprim] Shortness Of Breath and Rash  . Sulfamethoxazole-Trimethoprim Rash    rash  . Tylox [Oxycodone-Acetaminophen] Rash    MEDICATIONS: Current Outpatient Medications on File Prior to Visit  Medication Sig Dispense Refill  . aspirin 81 MG tablet Take 81 mg by mouth daily.     [name] buPROPion (WELLBUTRIN SR) 150 MG 12 hr tablet Take 1 tablet (150 mg total) by mouth 2 (two) times daily. 60 tablet 0  . citalopram (CELEXA) 20 MG tablet Take 1 tablet (20 mg total) by mouth daily. 90 tablet 1  . Fluticasone-Salmeterol (ADVAIR DISKUS) 100-50 MCG/DOSE AEPB Inhale 1 puff into the lungs 2 (two) times daily. 60 each 5  . hydrochlorothiazide (MICROZIDE) 12.5 MG capsule TAKE 1 CAPSULE BY MOUTH EVERY DAY 90 capsule 2  . lisinopril (PRINIVIL,ZESTRIL) 40 MG tablet TAKE 1 TABLET BY MOUTH EVERY DAY 90 tablet 2  . metFORMIN (GLUCOPHAGE) 500 MG tablet Take 1 tablet (500 mg total) by mouth 2 (two) times daily. 60 tablet 0  .  montelukast (SINGULAIR) 10 MG tablet Take 1 tablet (10 mg total) by mouth at bedtime. 90 tablet 3  . polyethylene glycol powder (GLYCOLAX/MIRALAX) powder Take 17 g by mouth daily. 3350 g 0  . ranitidine (ZANTAC) 150 MG tablet Take 1 tablet (150 mg total) by mouth at bedtime. 90 tablet 3  . topiramate (TOPAMAX) 50 MG tablet Take 1 tablet (50 mg total) by mouth daily. (Patient taking differently: Take 50 mg by mouth daily. Take 1 1/2 daily) 30 tablet 0  . Vitamin D, Ergocalciferol, (DRISDOL) 50000 units CAPS capsule Take 1 capsule (50,000 Units total) by mouth every 7 (seven) days. 4 capsule 0   No current facility-administered medications on  file prior to visit.     PAST MEDICAL HISTORY: Past Medical History:  Diagnosis Date  . Anemia   . Anxiety   . Arthritis    inflammitory  . Asthma   . B12 deficiency   . Back pain   . Chronic joint pain   . Constipation   . DDD (degenerative disc disease), lumbar    low back and neck and shoulders  . Depression    during divorce & legal matters  . Fatty liver   . Fibromyalgia   . Gallbladder problem   . GERD (gastroesophageal reflux disease)   . History of kidney stones    Dr Phebe Colla  . History of polycystic ovarian disease S/P BSO  . Hx of anxiety disorder   . Hypertension    a  . Hypothyroidism    no meds  . IBS (irritable bowel syndrome)   . IBS (irritable bowel syndrome)   . Infertility, female   . Joint pain   . Kidney problem   . Lactose intolerance   . Lupus (HCC) hx positive ANA   followed by Dr Gavin Pound; possible lupus  . Multiple food allergies   . Myalgia   . Osteoarthritis   . 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 POLYCYSTIC OVARIAN DISEASE 03/02/2007   Qualifier: Diagnosis of  By: Linna Darner MD, Rae Mar BSO for cysts & endometriosis; Dr Reino Kent, Gyn Seeing Dr Paula Compton    . PONV (postoperative nausea and vomiting)   . Prediabetes   . Sweating profusely   . Symptomatic mammary hypertrophy   . URI (upper respiratory infection)    currently on cefdinir    PAST SURGICAL HISTORY: Past Surgical History:  Procedure Laterality Date  . [place]   exploratory lap  . BREAST REDUCTION SURGERY Bilateral 03/26/2015   Procedure: BILATERAL BREAST REDUCTION ;  Surgeon: Wallace Going, DO;  Location: [facility];  Service: Plastics;  Laterality: Bilateral;  . CARPAL TUNNEL RELEASE     bilateral; Dr Sherwood Gambler  . COLONOSCOPY     in 1990s  . CYSTO/ RIGHT RETROGRADE URETERAL PYELOGRAM  07-09-2004   HX BILATERAL RENAL STONES/ RIGHT FLANK PAIN  . CYSTOSCOPY W/ URETERAL STENT PLACEMENT   11/12/2011   Procedure: CYSTOSCOPY WITH RETROGRADE PYELOGRAM/URETERAL STENT PLACEMENT;  Surgeon: Alexis Frock, MD;  Location: [facility];  Service: Urology;  Laterality: Right;  . CYSTOSCOPY W/ URETERAL STENT REMOVAL  12/07/2011   Procedure: CYSTOSCOPY WITH STENT REMOVAL;  Surgeon: Alexis Frock, MD;  Location: [facility];  Service: Urology;  Laterality: Right;  . CYSTOSCOPY/RETROGRADE/URETEROSCOPY/STONE EXTRACTION WITH BASKET  12/07/2011   Procedure: CYSTOSCOPY/RETROGRADE/URETEROSCOPY/STONE EXTRACTION WITH BASKET;  Surgeon: Alexis Frock, MD;  Location: [facility];  Service: Urology;  Laterality: Right;  . DILATION AND CURETTAGE OF UTERUS     X5  . EXCISION LEFT BARTHOLIN GLAND  02-05-2002  . EYE SURGERY     Retinal tears surgery bilateral  . LAPAROSCOPIC ASSISTED VAGINAL HYSTERECTOMY  2000  . LAPAROSCOPIC CHOLECYSTECTOMY  05-31-2007  . LAPAROSCOPIC LASER ABLATION ENDOMETRIOSIS AND LEFT SALPINGO-OOPHORECTOMY  11-03-1999  . LAPAROSCOPY WITH RIGHT SALPINGO-OOPHECTOMY/ LYSIS ADHESIONS AND ABLATION ENDOMETRIOSIS  05-17-2001  . LIPOSUCTION Bilateral 03/26/2015   Procedure: WITH LIPOSUCTION;  Surgeon: Wallace Going, DO;  Location: [facility];  Service: Plastics;  Laterality: Bilateral;  . LUMBAR LAMINECTOMY  2003   L4 - L5  . PLANTAR FASCIA SURGERY     right  . PLANTAR FASCIA SURGERY  02/2011   left  . RIGHT URETEROSCOPIC STONE EXTRACTION  08-04-2000  . TONSILLECTOMY    . URETERAL STENT PLACEMENT  11/12/11   Dr Tresa Moore    SOCIAL HISTORY: Social History   Tobacco Use  . Smoking status: Never Smoker  . Smokeless tobacco: Never Used  Substance Use Topics  . Alcohol use: Yes    Alcohol/week: 0.6 oz    Types: 1 Shots of liquor per week    Comment:  very rarely  . Drug use: No    FAMILY HISTORY: Family History  Problem Relation Age of Onset  . Hypertension Mother   . Colon polyps Mother   . Atrial fibrillation Mother        Dr  Angelena Form  . Hyperlipidemia Mother   . Heart disease Mother   . Anxiety disorder Mother   . Sleep apnea Mother   . Obesity Mother   . Heart attack Father 40  . Diabetes Father   . Hypertension Father   . Hyperlipidemia Father   . Heart disease Father   . Stroke Father   . Obesity Father   . Hypertension Sister   . Heart attack Paternal Grandmother 81  . Breast cancer Paternal Aunt   . Colon cancer Maternal Uncle   . Colon cancer Paternal Uncle   . Colon polyps Maternal Grandmother   . Stroke Maternal Grandmother 76  . Diabetes Paternal Grandfather   . Stroke Maternal Grandfather 37    ROS: Review of Systems  Constitutional: Positive for weight loss.  Psychiatric/Behavioral: Positive for depression. Negative for suicidal ideas.    PHYSICAL EXAM: Blood pressure 119/73, pulse 87, temperature 98.3 F (36.8 C), temperature source Oral, height 5\' 8"  (1.727 m), weight 234 lb (106.1 kg), SpO2 99 %. Body mass index is 35.58 kg/m. Physical Exam  Constitutional: She is oriented to person, place, and time. [name].  Cardiovascular: Normal rate.  Pulmonary/Chest: Effort normal.  Musculoskeletal: Normal range of motion.  Neurological: She is oriented to person, place, and time.  Skin: Skin is warm and dry.  Psychiatric: She has a normal mood and affect. Her behavior is normal.  Vitals reviewed.   RECENT LABS AND TESTS: BMET    Component Value Date/Time   NA 144 07/04/2017 1216   K 4.2 07/04/2017 1216   CL 108 (H) 07/04/2017 1216   CO2 22 07/04/2017 1216   GLUCOSE 74 07/04/2017 1216   GLUCOSE 92 08/16/2016 1011   BUN 24 07/04/2017 1216   CREATININE 0.76 07/04/2017 1216   CALCIUM 9.5 07/04/2017 1216   GFRNONAA 94 07/04/2017 1216   GFRAA 108 07/04/2017 1216   Lab Results  Component Value Date   HGBA1C 5.8 (H) 07/04/2017   HGBA1C  5.6 02/06/2017   HGBA1C 5.6 09/28/2016   HGBA1C 5.9 08/16/2016   HGBA1C 5.9 06/04/2015   Lab Results   Component Value Date   INSULIN 10.4 07/04/2017   INSULIN 28.1 (H) 02/06/2017   INSULIN 14.7 09/28/2016   CBC    Component Value Date/Time   WBC 7.5 07/04/2017 1216   WBC 6.6 08/16/2016 1011   RBC 4.80 07/04/2017 1216   RBC 4.68 08/16/2016 1011   HGB 13.0 07/04/2017 1216   HCT 40.3 07/04/2017 1216   PLT 235.0 08/16/2016 1011   MCV 84 07/04/2017 1216   MCH 27.1 07/04/2017 1216   MCH 27.0 04/19/2015 0650   MCHC 32.3 07/04/2017 1216   MCHC 33.4 08/16/2016 1011   RDW 15.2 07/04/2017 1216   LYMPHSABS 2.4 07/04/2017 1216   MONOABS 0.6 08/16/2016 1011   EOSABS 0.2 07/04/2017 1216   BASOSABS 0.0 07/04/2017 1216   Iron/TIBC/Ferritin/ %Sat No results found for: IRON, TIBC, FERRITIN, IRONPCTSAT Lipid Panel     Component Value Date/Time   CHOL 158 07/04/2017 1216   TRIG 69 07/04/2017 1216   HDL 60 07/04/2017 1216   CHOLHDL 3 08/16/2016 1011   VLDL 12.2 08/16/2016 1011   LDLCALC 84 07/04/2017 1216   Hepatic Function Panel     Component Value Date/Time   PROT 6.6 07/04/2017 1216   ALBUMIN 4.1 07/04/2017 1216   AST 15 07/04/2017 1216   ALT 21 07/04/2017 1216   ALKPHOS 98 07/04/2017 1216   BILITOT 0.3 07/04/2017 1216   BILIDIR 0.1 06/12/2013 1632   IBILI 0.2 06/11/2009 2309      Component Value Date/Time   TSH 2.100 07/04/2017 1216   TSH 1.54 08/16/2016 1011   TSH 2.40 06/04/2015 0857    ASSESSMENT AND PLAN: Depression with anxiety - Plan: buPROPion (WELLBUTRIN SR) 150 MG 12 hr tablet, topiramate (TOPAMAX) 50 MG tablet  Class 2 severe obesity with serious comorbidity and body mass index (BMI) of 35.0 to 35.9 in adult, unspecified obesity type ([place])  PLAN:  Depression with Anxiety We discussed behavior modification techniques today to help Kathye deal with her anxiety, emotional eating, and depression. Ashwini agrees to continue taking Wellbutrin SR 150 mg BID #180 with no refills (90 day supply), she agrees to continue taking Topamax 50 mg #90 with no refills (90  day supply). Keelie agrees to follow up with our clinic in 6 weeks.  Obesity Vona is currently in the action stage of change. As such, her goal is to continue with weight loss efforts She has agreed to keep a strict food journal with 1600 calories and 90+ grams of protein daily  Mayreli has been instructed to work up to a goal of 150 minutes of combined cardio and strengthening exercise per week for weight loss and overall health benefits. We discussed the following Behavioral Modification Strategies today: increasing lean protein intake, decreasing simple carbohydrates, and no skipping meals    Tyreka has agreed to follow up with our clinic in 6 weeks. She was informed of the importance of frequent follow up visits to maximize her success with intensive lifestyle modifications for her multiple health conditions.   OBESITY BEHAVIORAL INTERVENTION VISIT  Today's visit was # 19 out of 22.  Starting weight: 241 lbs Starting date: 09/28/16 Today's weight : 234 lbs Today's date: 09/04/2017 Total lbs lost to date: 7 (Patients must lose 7 lbs in the first 6 months to continue with counseling)   ASK: We discussed the diagnosis of obesity with Carroll Sage  Lorenda Cahill today and Kesa agreed to give [place] permission to discuss obesity behavioral modification therapy today.  ASSESS: Maui has the diagnosis of obesity and her BMI today is 35.59 Jozy is in the action stage of change   ADVISE: Kathyann was educated on the multiple health risks of obesity as well as the benefit of weight loss to improve her health. She was advised of the need for long term treatment and the importance of lifestyle modifications.  AGREE: Multiple dietary modification options and treatment options were discussed and  Kyisha agreed to the above obesity treatment plan.  I, Trixie Dredge, am acting as transcriptionist for Dennard Nip, MD  I have reviewed the above documentation for accuracy and  completeness, and I agree with the above. -Dennard Nip, MD

## 2017-09-07 DIAGNOSIS — M25511 Pain in right shoulder: Secondary | ICD-10-CM | POA: Diagnosis not present

## 2017-09-07 DIAGNOSIS — M25611 Stiffness of right shoulder, not elsewhere classified: Secondary | ICD-10-CM | POA: Diagnosis not present

## 2017-09-07 DIAGNOSIS — M6281 Muscle weakness (generalized): Secondary | ICD-10-CM | POA: Diagnosis not present

## 2017-09-07 DIAGNOSIS — M75121 Complete rotator cuff tear or rupture of right shoulder, not specified as traumatic: Secondary | ICD-10-CM | POA: Diagnosis not present

## 2017-09-07 MED ORDER — METFORMIN HCL 500 MG PO TABS: 500.0000 mg | ORAL_TABLET | Freq: Two times a day (BID) | ORAL | 0 refills | Status: DC

## 2017-09-11 DIAGNOSIS — M6281 Muscle weakness (generalized): Secondary | ICD-10-CM | POA: Diagnosis not present

## 2017-09-11 DIAGNOSIS — M25511 Pain in right shoulder: Secondary | ICD-10-CM | POA: Diagnosis not present

## 2017-09-11 DIAGNOSIS — M25611 Stiffness of right shoulder, not elsewhere classified: Secondary | ICD-10-CM | POA: Diagnosis not present

## 2017-09-11 DIAGNOSIS — M75121 Complete rotator cuff tear or rupture of right shoulder, not specified as traumatic: Secondary | ICD-10-CM | POA: Diagnosis not present

## 2017-09-18 ENCOUNTER — Encounter: Payer: Self-pay | Admitting: Internal Medicine

## 2017-09-18 DIAGNOSIS — M75121 Complete rotator cuff tear or rupture of right shoulder, not specified as traumatic: Secondary | ICD-10-CM | POA: Diagnosis not present

## 2017-09-18 DIAGNOSIS — M6281 Muscle weakness (generalized): Secondary | ICD-10-CM | POA: Diagnosis not present

## 2017-09-18 DIAGNOSIS — M25511 Pain in right shoulder: Secondary | ICD-10-CM | POA: Diagnosis not present

## 2017-09-18 DIAGNOSIS — M25611 Stiffness of right shoulder, not elsewhere classified: Secondary | ICD-10-CM | POA: Diagnosis not present

## 2017-09-18 MED ORDER — LISINOPRIL 40 MG PO TABS: ORAL_TABLET | ORAL | 1 refills | Status: DC

## 2017-09-21 DIAGNOSIS — M6281 Muscle weakness (generalized): Secondary | ICD-10-CM | POA: Diagnosis not present

## 2017-09-21 DIAGNOSIS — M25611 Stiffness of right shoulder, not elsewhere classified: Secondary | ICD-10-CM | POA: Diagnosis not present

## 2017-09-21 DIAGNOSIS — M25511 Pain in right shoulder: Secondary | ICD-10-CM | POA: Diagnosis not present

## 2017-09-21 DIAGNOSIS — M75121 Complete rotator cuff tear or rupture of right shoulder, not specified as traumatic: Secondary | ICD-10-CM | POA: Diagnosis not present

## 2017-09-25 DIAGNOSIS — M25511 Pain in right shoulder: Secondary | ICD-10-CM | POA: Diagnosis not present

## 2017-09-25 DIAGNOSIS — M25611 Stiffness of right shoulder, not elsewhere classified: Secondary | ICD-10-CM | POA: Diagnosis not present

## 2017-09-25 DIAGNOSIS — M75121 Complete rotator cuff tear or rupture of right shoulder, not specified as traumatic: Secondary | ICD-10-CM | POA: Diagnosis not present

## 2017-09-25 DIAGNOSIS — M6281 Muscle weakness (generalized): Secondary | ICD-10-CM | POA: Diagnosis not present

## 2017-10-16 ENCOUNTER — Ambulatory Visit (INDEPENDENT_AMBULATORY_CARE_PROVIDER_SITE_OTHER): Payer: PPO | Admitting: Family Medicine

## 2017-10-16 VITALS — BP 105/74 | HR 89 | Temp 98.4°F | Ht 68.0 in | Wt 235.0 lb

## 2017-10-16 DIAGNOSIS — E559 Vitamin D deficiency, unspecified: Secondary | ICD-10-CM

## 2017-10-16 DIAGNOSIS — R7303 Prediabetes: Secondary | ICD-10-CM | POA: Diagnosis not present

## 2017-10-16 DIAGNOSIS — F418 Other specified anxiety disorders: Secondary | ICD-10-CM | POA: Diagnosis not present

## 2017-10-16 DIAGNOSIS — Z6835 Body mass index (BMI) 35.0-35.9, adult: Secondary | ICD-10-CM | POA: Diagnosis not present

## 2017-10-16 MED ORDER — VITAMIN D (ERGOCALCIFEROL) 1.25 MG (50000 UNIT) PO CAPS
50000.0000 [IU] | ORAL_CAPSULE | ORAL | 0 refills | Status: DC
Start: 2017-10-16 — End: 2017-11-07

## 2017-10-16 MED ORDER — BUPROPION HCL ER (SR) 200 MG PO TB12: 200.0000 mg | ORAL_TABLET | Freq: Two times a day (BID) | ORAL | 0 refills | Status: DC

## 2017-10-16 MED ORDER — METFORMIN HCL 500 MG PO TABS: 500.0000 mg | ORAL_TABLET | Freq: Two times a day (BID) | ORAL | 0 refills | Status: DC

## 2017-10-16 MED ORDER — TOPIRAMATE 50 MG PO TABS: 50.0000 mg | ORAL_TABLET | Freq: Every day | ORAL | 0 refills | Status: DC

## 2017-10-16 NOTE — Progress Notes (Signed)
Office: [phone]  /  Fax: [phone]   HPI:   Chief Complaint: OBESITY [name] is here to discuss her progress with her obesity treatment plan. She is on the keep a food journal with 1600 calories and 90+ grams of protein daily and is following her eating plan approximately 65 % of the time. She states she was walking and hiking on vacations. Saylor traveled and increased eating out in the last few weeks and still tried to be mindful. She is done with vacations for the next 2 months and ready to get back on track.  Her weight is 235 lb (106.6 kg) today and has gained 1 pound since her last visit. She has lost 6 lbs since starting treatment with [place].  Vitamin D Deficiency [name] has a diagnosis of vitamin D deficiency. She is stable on prescription Vit D, not yet at goal. She denies nausea, vomiting or muscle weakness.  Pre-Diabetes [name] has a diagnosis of pre-diabetes based on her elevated Hgb A1c and was informed this puts her at greater risk of developing diabetes. She has increased simple carbohydrates with increased vacation eating but is ready to get back on track. She continues to work on diet and exercise to decrease risk of diabetes. She denies nausea, vomiting, or hypoglycemia.  Depression with Anxiety [name] notes decrease in energy, worse in the last month. She knows this may be related to topiramate but feels this is helping her emotional eating and doesn't want to discontinue. [name] struggles with emotional eating and using food for comfort to the extent that it is negatively impacting her health. She often snacks when she is not hungry. [name] sometimes feels she is out of control and then feels guilty that she made poor food choices. She has been working on behavior modification techniques to help reduce her emotional eating and has been somewhat successful. She shows no sign of suicidal or homicidal ideations.  Depression screen [name] 2/9 09/28/2016 07/29/2015  09/22/2014  Decreased Interest 2 0 0  Down, Depressed, Hopeless 1 0 0  PHQ - 2 Score 3 0 0  Altered sleeping 1 - -  Tired, decreased energy 2 - -  Change in appetite 1 - -  Feeling bad or failure about yourself  1 - -  Trouble concentrating 2 - -  Moving slowly or fidgety/restless 1 - -  Suicidal thoughts 0 - -  PHQ-9 Score 11 - -  Some recent data might be hidden    ALLERGIES: Allergies  Allergen Reactions  . Chloraprep One Step [Chlorhexidine Gluconate] Rash    Developed severe rash where chloraprep was used on chest area  . Ivp Dye [Iodinated Diagnostic Agents] Rash and Other (See Comments)    Flushing, minor facial rash & dyspnea  . Nalbuphine Shortness Of Breath and Rash    Nubain caused respiratory distress & rash  . Septra [Sulfamethoxazole-Trimethoprim] Shortness Of Breath and Rash  . Sulfamethoxazole-Trimethoprim Rash    rash  . Tylox [Oxycodone-Acetaminophen] Rash    MEDICATIONS: Current Outpatient Medications on File Prior to Visit  Medication Sig Dispense Refill  . aspirin 81 MG tablet Take 81 mg by mouth daily.     . citalopram (CELEXA) 20 MG tablet Take 1 tablet (20 mg total) by mouth daily. 90 tablet 1  . Fluticasone-Salmeterol (ADVAIR DISKUS) 100-50 MCG/DOSE AEPB Inhale 1 puff into the lungs 2 (two) times daily. 60 each 5  . hydrochlorothiazide (MICROZIDE) 12.5 MG capsule TAKE 1 CAPSULE BY MOUTH EVERY DAY 90 capsule  2  . lisinopril (PRINIVIL,ZESTRIL) 40 MG tablet TAKE 1 TABLET BY MOUTH EVERY DAY 90 tablet 1  . metFORMIN (GLUCOPHAGE) 500 MG tablet Take 1 tablet (500 mg total) by mouth 2 (two) times daily. 180 tablet 0  . montelukast (SINGULAIR) 10 MG tablet Take 1 tablet (10 mg total) by mouth at bedtime. 90 tablet 3  . polyethylene glycol powder (GLYCOLAX/MIRALAX) powder Take 17 g by mouth daily. 3350 g 0  . ranitidine (ZANTAC) 150 MG tablet Take 1 tablet (150 mg total) by mouth at bedtime. 90 tablet 3  . topiramate (TOPAMAX) 50 MG tablet Take 1 tablet (50 mg  total) by mouth daily. 90 tablet 0  . Vitamin D, Ergocalciferol, (DRISDOL) 50000 units CAPS capsule Take 1 capsule (50,000 Units total) by mouth every 7 (seven) days. 4 capsule 0   No current facility-administered medications on file prior to visit.     PAST MEDICAL HISTORY: Past Medical History:  Diagnosis Date  . Anemia   . Anxiety   . Arthritis    inflammitory  . Asthma   . B12 deficiency   . Back pain   . Chronic joint pain   . Constipation   . DDD (degenerative disc disease), lumbar    low back and neck and shoulders  . Depression    during divorce & legal matters  . Fatty liver   . Fibromyalgia   . Gallbladder problem   . GERD (gastroesophageal reflux disease)   . History of kidney stones    Dr Phebe Colla  . History of polycystic ovarian disease S/P BSO  . Hx of anxiety disorder   . Hypertension    a  . Hypothyroidism    no meds  . IBS (irritable bowel syndrome)   . IBS (irritable bowel syndrome)   . Infertility, female   . Joint pain   . Kidney problem   . Lactose intolerance   . Lupus (HCC) hx positive ANA   followed by Dr Gavin Pound; possible lupus  . Multiple food allergies   . Myalgia   . Osteoarthritis   . 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 POLYCYSTIC OVARIAN DISEASE 03/02/2007   Qualifier: Diagnosis of  By: Linna Darner MD, Rae Mar BSO for cysts & endometriosis; Dr Reino Kent, Gyn Seeing Dr Paula Compton    . PONV (postoperative nausea and vomiting)   . Prediabetes   . Sweating profusely   . Symptomatic mammary hypertrophy   . URI (upper respiratory infection)    currently on cefdinir    PAST SURGICAL HISTORY: Past Surgical History:  Procedure Laterality Date  . [place]   exploratory lap  . BREAST REDUCTION SURGERY Bilateral 03/26/2015   Procedure: BILATERAL BREAST REDUCTION ;  Surgeon: Wallace Going, DO;  Location: [place];  Service: Plastics;  Laterality: Bilateral;  . CARPAL  TUNNEL RELEASE     bilateral; Dr Sherwood Gambler  . COLONOSCOPY     in 1990s  . CYSTO/ RIGHT RETROGRADE URETERAL PYELOGRAM  07-09-2004   HX BILATERAL RENAL STONES/ RIGHT FLANK PAIN  . CYSTOSCOPY W/ URETERAL STENT PLACEMENT  11/12/2011   Procedure: CYSTOSCOPY WITH RETROGRADE PYELOGRAM/URETERAL STENT PLACEMENT;  Surgeon: Alexis Frock, MD;  Location: [facility];  Service: Urology;  Laterality: Right;  . CYSTOSCOPY W/ URETERAL STENT REMOVAL  12/07/2011   Procedure: CYSTOSCOPY WITH STENT REMOVAL;  Surgeon: Alexis Frock, MD;  Location: [facility];  Service: Urology;  Laterality: Right;  . CYSTOSCOPY/RETROGRADE/URETEROSCOPY/STONE EXTRACTION WITH BASKET  12/07/2011   Procedure: CYSTOSCOPY/RETROGRADE/URETEROSCOPY/STONE EXTRACTION WITH BASKET;  Surgeon: Alexis Frock, MD;  Location: [facility];  Service: Urology;  Laterality: Right;  . DILATION AND CURETTAGE OF UTERUS     X5  . EXCISION LEFT BARTHOLIN GLAND  02-05-2002  . EYE SURGERY     Retinal tears surgery bilateral  . LAPAROSCOPIC ASSISTED VAGINAL HYSTERECTOMY  2000  . LAPAROSCOPIC CHOLECYSTECTOMY  05-31-2007  . LAPAROSCOPIC LASER ABLATION ENDOMETRIOSIS AND LEFT SALPINGO-OOPHORECTOMY  11-03-1999  . LAPAROSCOPY WITH RIGHT SALPINGO-OOPHECTOMY/ LYSIS ADHESIONS AND ABLATION ENDOMETRIOSIS  05-17-2001  . LIPOSUCTION Bilateral 03/26/2015   Procedure: WITH LIPOSUCTION;  Surgeon: Wallace Going, DO;  Location: [place];  Service: Plastics;  Laterality: Bilateral;  . LUMBAR LAMINECTOMY  2003   L4 - L5  . PLANTAR FASCIA SURGERY     right  . PLANTAR FASCIA SURGERY  02/2011   left  . RIGHT URETEROSCOPIC STONE EXTRACTION  08-04-2000  . TONSILLECTOMY    . URETERAL STENT PLACEMENT  11/12/11   Dr Tresa Moore    SOCIAL HISTORY: Social History   Tobacco Use  . Smoking status: Never Smoker  . Smokeless tobacco: Never Used  Substance Use Topics  . Alcohol use: Yes    Alcohol/week: 0.6 oz    Types: 1 Shots of  liquor per week    Comment:  very rarely  . Drug use: No    FAMILY HISTORY: Family History  Problem Relation Age of Onset  . Hypertension Mother   . Colon polyps Mother   . Atrial fibrillation Mother        Dr Angelena Form  . Hyperlipidemia Mother   . Heart disease Mother   . Anxiety disorder Mother   . Sleep apnea Mother   . Obesity Mother   . Heart attack Father 86  . Diabetes Father   . Hypertension Father   . Hyperlipidemia Father   . Heart disease Father   . Stroke Father   . Obesity Father   . Hypertension Sister   . Heart attack Paternal Grandmother 34  . Breast cancer Paternal Aunt   . Colon cancer Maternal Uncle   . Colon cancer Paternal Uncle   . Colon polyps Maternal Grandmother   . Stroke Maternal Grandmother 76  . Diabetes Paternal Grandfather   . Stroke Maternal Grandfather 37    ROS: Review of Systems  Constitutional: Negative for weight loss.  Gastrointestinal: Negative for nausea and vomiting.  Musculoskeletal:       Negative muscle weakness  Endo/Heme/Allergies:       Negative hypoglycemia  Psychiatric/Behavioral: Positive for depression. Negative for suicidal ideas.    PHYSICAL EXAM: Blood pressure 105/74, pulse 89, temperature 98.4 F (36.9 C), temperature source Oral, height 5\' 8"  (1.727 m), weight 235 lb (106.6 kg), SpO2 100 %. Body mass index is 35.73 kg/m. Physical Exam  Constitutional: She is oriented to person, place, and time. [name].  Cardiovascular: Normal rate.  Pulmonary/Chest: Effort normal.  Musculoskeletal: Normal range of motion.  Neurological: She is oriented to person, place, and time.  Skin: Skin is warm and dry.  Psychiatric: She has a normal mood and affect. Her behavior is normal.  Vitals reviewed.   RECENT LABS AND TESTS: BMET    Component Value Date/Time   NA 144 07/04/2017 1216   K 4.2 07/04/2017 1216   CL 108 (H) 07/04/2017 1216   CO2 22 07/04/2017 1216  GLUCOSE 74  07/04/2017 1216   GLUCOSE 92 08/16/2016 1011   BUN 24 07/04/2017 1216   CREATININE 0.76 07/04/2017 1216   CALCIUM 9.5 07/04/2017 1216   GFRNONAA 94 07/04/2017 1216   GFRAA 108 07/04/2017 1216   Lab Results  Component Value Date   HGBA1C 5.8 (H) 07/04/2017   HGBA1C 5.6 02/06/2017   HGBA1C 5.6 09/28/2016   HGBA1C 5.9 08/16/2016   HGBA1C 5.9 06/04/2015   Lab Results  Component Value Date   INSULIN 10.4 07/04/2017   INSULIN 28.1 (H) 02/06/2017   INSULIN 14.7 09/28/2016   CBC    Component Value Date/Time   WBC 7.5 07/04/2017 1216   WBC 6.6 08/16/2016 1011   RBC 4.80 07/04/2017 1216   RBC 4.68 08/16/2016 1011   HGB 13.0 07/04/2017 1216   HCT 40.3 07/04/2017 1216   PLT 235.0 08/16/2016 1011   MCV 84 07/04/2017 1216   MCH 27.1 07/04/2017 1216   MCH 27.0 04/19/2015 0650   MCHC 32.3 07/04/2017 1216   MCHC 33.4 08/16/2016 1011   RDW 15.2 07/04/2017 1216   LYMPHSABS 2.4 07/04/2017 1216   MONOABS 0.6 08/16/2016 1011   EOSABS 0.2 07/04/2017 1216   BASOSABS 0.0 07/04/2017 1216   Iron/TIBC/Ferritin/ %Sat No results found for: IRON, TIBC, FERRITIN, IRONPCTSAT Lipid Panel     Component Value Date/Time   CHOL 158 07/04/2017 1216   TRIG 69 07/04/2017 1216   HDL 60 07/04/2017 1216   CHOLHDL 3 08/16/2016 1011   VLDL 12.2 08/16/2016 1011   LDLCALC 84 07/04/2017 1216   Hepatic Function Panel     Component Value Date/Time   PROT 6.6 07/04/2017 1216   ALBUMIN 4.1 07/04/2017 1216   AST 15 07/04/2017 1216   ALT 21 07/04/2017 1216   ALKPHOS 98 07/04/2017 1216   BILITOT 0.3 07/04/2017 1216   BILIDIR 0.1 06/12/2013 1632   IBILI 0.2 06/11/2009 2309      Component Value Date/Time   TSH 2.100 07/04/2017 1216   TSH 1.54 08/16/2016 1011   TSH 2.40 06/04/2015 0857  Results for TALICIA, SUI (MRN [id number]) as of 10/16/2017 15:28  Ref. Range 07/04/2017 12:16  Vitamin D, 25-Hydroxy Latest Ref Range: 30.0 - 100.0 ng/mL 49.7    ASSESSMENT AND PLAN: Vitamin D deficiency - Plan:  Vitamin D, Ergocalciferol, (DRISDOL) 50000 units CAPS capsule  Prediabetes - Plan: metFORMIN (GLUCOPHAGE) 500 MG tablet  Depression with anxiety - Plan: topiramate (TOPAMAX) 50 MG tablet, buPROPion (WELLBUTRIN SR) 200 MG 12 hr tablet  Class 2 severe obesity with serious comorbidity and body mass index (BMI) of 35.0 to 35.9 in adult, unspecified obesity type (Reform)  PLAN:  Vitamin D Deficiency Alonda was informed that low vitamin D levels contributes to fatigue and are associated with obesity, breast, and colon cancer. Vianca agrees to continue taking prescription Vit D @50 ,000 IU every week #4 and we will refill for 1 month. She will follow up for routine testing of vitamin D, at least 2-3 times per year. She was informed of the risk of over-replacement of vitamin D and agrees to not increase her dose unless she discusses this with [place] first. Racheal agrees to follow up with our clinic in 2 to 3 weeks.  Pre-Diabetes Laniyah will continue to work on weight loss, diet, exercise, and decreasing simple carbohydrates in her diet to help decrease the risk of diabetes. We dicussed metformin including benefits and risks. She was informed that eating too many simple carbohydrates or too many calories at  one sitting increases the likelihood of GI side effects. Lakeasha agrees to continue taking metformin 500 mg BID #60 and we will refill for 1 month. Nechuma agrees to follow up with our clinic in 2 to 3 weeks as directed to monitor her progress.  Depression with Anxiety We discussed behavior modification techniques today to help Lakshmi deal with her emotional eating, anxiety, and depression. Oprah agrees to increase Wellbutrin SR to 200 mg BID #60 with no refills, and she agrees to continue taking topiramate 50 mg qd #30 and we will refill for 1 month; She agrees to continue Celexa. Norva agrees to follow up with our clinic in 2 to 3 weeks.  Obesity Allee is currently in the action stage of  change. As such, her goal is to continue with weight loss efforts She has agreed to keep a food journal with 1400-1600 calories and 90+ grams of protein daily Aleesa has been instructed to work up to a goal of 150 minutes of combined cardio and strengthening exercise per week for weight loss and overall health benefits. We discussed the following Behavioral Modification Strategies today: increasing lean protein intake, decreasing simple carbohydrates, And travel eating strategies    Lisl has agreed to follow up with our clinic in 2 to 3 weeks. She was informed of the importance of frequent follow up visits to maximize her success with intensive lifestyle modifications for her multiple health conditions.   OBESITY BEHAVIORAL INTERVENTION VISIT  Today's visit was # 20 out of 22.  Starting weight: 241 lbs Starting date: 09/28/16 Today's weight : 235 lbs  Today's date: 10/16/2017 Total lbs lost to date: 6    ASK: We discussed the diagnosis of obesity with Anne Ng today and Emiko agreed to give [place] permission to discuss obesity behavioral modification therapy today.  ASSESS: Hester has the diagnosis of obesity and her BMI today is 35.74 Corean is in the action stage of change   ADVISE: Gabriela was educated on the multiple health risks of obesity as well as the benefit of weight loss to improve her health. She was advised of the need for long term treatment and the importance of lifestyle modifications.  AGREE: Multiple dietary modification options and treatment options were discussed and  Lorriane agreed to the above obesity treatment plan.  I, Trixie Dredge, am acting as transcriptionist for Dennard Nip, MD  I have reviewed the above documentation for accuracy and completeness, and I agree with the above. -Dennard Nip, MD

## 2017-10-17 DIAGNOSIS — M533 Sacrococcygeal disorders, not elsewhere classified: Secondary | ICD-10-CM | POA: Diagnosis not present

## 2017-10-30 DIAGNOSIS — M25511 Pain in right shoulder: Secondary | ICD-10-CM | POA: Diagnosis not present

## 2017-10-31 ENCOUNTER — Ambulatory Visit (INDEPENDENT_AMBULATORY_CARE_PROVIDER_SITE_OTHER): Payer: Self-pay | Admitting: Family Medicine

## 2017-11-06 ENCOUNTER — Other Ambulatory Visit (INDEPENDENT_AMBULATORY_CARE_PROVIDER_SITE_OTHER): Payer: Self-pay | Admitting: Family Medicine

## 2017-11-06 ENCOUNTER — Other Ambulatory Visit: Payer: Self-pay | Admitting: Internal Medicine

## 2017-11-06 DIAGNOSIS — E559 Vitamin D deficiency, unspecified: Secondary | ICD-10-CM

## 2017-11-07 ENCOUNTER — Ambulatory Visit (INDEPENDENT_AMBULATORY_CARE_PROVIDER_SITE_OTHER): Payer: PPO | Admitting: Family Medicine

## 2017-11-07 VITALS — BP 123/75 | HR 97 | Temp 98.9°F | Ht 68.0 in | Wt 236.0 lb

## 2017-11-07 DIAGNOSIS — E559 Vitamin D deficiency, unspecified: Secondary | ICD-10-CM | POA: Diagnosis not present

## 2017-11-07 DIAGNOSIS — Z6835 Body mass index (BMI) 35.0-35.9, adult: Secondary | ICD-10-CM | POA: Diagnosis not present

## 2017-11-07 DIAGNOSIS — F3289 Other specified depressive episodes: Secondary | ICD-10-CM | POA: Diagnosis not present

## 2017-11-07 DIAGNOSIS — R7303 Prediabetes: Secondary | ICD-10-CM

## 2017-11-08 MED ORDER — BUPROPION HCL ER (SR) 200 MG PO TB12: 200.0000 mg | ORAL_TABLET | Freq: Two times a day (BID) | ORAL | 0 refills | Status: DC

## 2017-11-08 MED ORDER — METFORMIN HCL 500 MG PO TABS: 500.0000 mg | ORAL_TABLET | Freq: Two times a day (BID) | ORAL | 0 refills | Status: DC

## 2017-11-08 MED ORDER — VITAMIN D (ERGOCALCIFEROL) 1.25 MG (50000 UNIT) PO CAPS: 50000.0000 [IU] | ORAL_CAPSULE | ORAL | 0 refills | Status: DC

## 2017-11-09 NOTE — Progress Notes (Signed)
Office: [phone]  /  Fax: [phone]   HPI:   Chief Complaint: OBESITY [name] is here to discuss her progress with her obesity treatment plan. She is on the keep a food journal with 1400 to 1600 calories and 90+ grams of protein daily and is following her eating plan approximately 75 % of the time. She states she is swimming 2 hours 2 to 3 times per week. [name] has been on prednisone for shoulder pain, and has struggled more. [name] has been watching her two nephews and she increased eating out, but she was working on making better food choices. She will have two nieces visiting the next two weeks, so she weill have similar eating. Her weight is 236 lb (107 kg) today and has had a weight gain of 1 pound over a period of 3 weeks since her last visit. She has lost 5 lbs since starting treatment with [place].  Pre-Diabetes [name] has a diagnosis of prediabetes based on her elevated Hgb A1c and was informed this puts her at greater risk of developing diabetes. She is stable on metformin, but she still struggled with polyphagia, especially while on prednisone. [name] continues to work on diet and exercise to decrease risk of diabetes. She denies nausea, vomiting or hypoglycemia.  Vitamin D deficiency [name] has a diagnosis of vitamin D deficiency. She is stable on vit D and denies nausea, vomiting or muscle weakness.  Depression with emotional eating behaviors [name] notes increased irritability and increased insomnia on prednisone. She is tolerating wellbutrin well. [name] struggles with emotional eating and using food for comfort to the extent that it is negatively impacting her health. She often snacks when she is not hungry. [name] sometimes feels she is out of control and then feels guilty that she made poor food choices. She has been working on behavior modification techniques to help reduce her emotional eating and has been somewhat successful. She shows no sign of suicidal  or homicidal ideations.  Depression screen [facility] 2/9 09/28/2016 07/29/2015 09/22/2014  Decreased Interest 2 0 0  Down, Depressed, Hopeless 1 0 0  PHQ - 2 Score 3 0 0  Altered sleeping 1 - -  Tired, decreased energy 2 - -  Change in appetite 1 - -  Feeling bad or failure about yourself  1 - -  Trouble concentrating 2 - -  Moving slowly or fidgety/restless 1 - -  Suicidal thoughts 0 - -  PHQ-9 Score 11 - -  Some recent data might be hidden     ALLERGIES: Allergies  Allergen Reactions  . Chloraprep One Step [Chlorhexidine Gluconate] Rash    Developed severe rash where chloraprep was used on chest area  . Ivp Dye [Iodinated Diagnostic Agents] Rash and Other (See Comments)    Flushing, minor facial rash & dyspnea  . Nalbuphine Shortness Of Breath and Rash    Nubain caused respiratory distress & rash  . Septra [Sulfamethoxazole-Trimethoprim] Shortness Of Breath and Rash  . Sulfamethoxazole-Trimethoprim Rash    rash  . Tylox [Oxycodone-Acetaminophen] Rash    MEDICATIONS: Current Outpatient Medications on File Prior to Visit  Medication Sig Dispense Refill  . aspirin 81 MG tablet Take 81 mg by mouth daily.     . citalopram (CELEXA) 20 MG tablet Take 1 tablet (20 mg total) by mouth daily. 90 tablet 1  . Fluticasone-Salmeterol (ADVAIR DISKUS) 100-50 MCG/DOSE AEPB Inhale 1 puff into the lungs 2 (two) times daily. 60 each 5  . hydrochlorothiazide (MICROZIDE) 12.5 MG capsule  TAKE 1 CAPSULE BY MOUTH EVERY DAY 90 capsule 2  . lisinopril (PRINIVIL,ZESTRIL) 40 MG tablet TAKE 1 TABLET BY MOUTH EVERY DAY 90 tablet 1  . montelukast (SINGULAIR) 10 MG tablet TAKE 1 TABLET BY MOUTH EVERYDAY AT BEDTIME 90 tablet 0  . polyethylene glycol powder (GLYCOLAX/MIRALAX) powder Take 17 g by mouth daily. 3350 g 0  . predniSONE (DELTASONE) 1 MG tablet Take 10 mg by mouth 2 (two) times daily.    . ranitidine (ZANTAC) 150 MG tablet Take 1 tablet (150 mg total) by mouth at bedtime. 90 tablet 3  . topiramate  (TOPAMAX) 50 MG tablet Take 1 tablet (50 mg total) by mouth daily. 90 tablet 0   No current facility-administered medications on file prior to visit.     PAST MEDICAL HISTORY: Past Medical History:  Diagnosis Date  . Anemia   . Anxiety   . Arthritis    inflammitory  . Asthma   . B12 deficiency   . Back pain   . Chronic joint pain   . Constipation   . DDD (degenerative disc disease), lumbar    low back and neck and shoulders  . Depression    during divorce & legal matters  . Fatty liver   . Fibromyalgia   . Gallbladder problem   . GERD (gastroesophageal reflux disease)   . History of kidney stones    Dr Phebe Colla  . History of polycystic ovarian disease S/P BSO  . Hx of anxiety disorder   . Hypertension    a  . Hypothyroidism    no meds  . IBS (irritable bowel syndrome)   . IBS (irritable bowel syndrome)   . Infertility, female   . Joint pain   . Kidney problem   . Lactose intolerance   . Lupus (HCC) hx positive ANA   followed by Dr Gavin Pound; possible lupus  . Multiple food allergies   . Myalgia   . Osteoarthritis   . 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 POLYCYSTIC OVARIAN DISEASE 03/02/2007   Qualifier: Diagnosis of  By: Linna Darner MD, Rae Mar BSO for cysts & endometriosis; Dr Reino Kent, Gyn Seeing Dr Paula Compton    . PONV (postoperative nausea and vomiting)   . Prediabetes   . Sweating profusely   . Symptomatic mammary hypertrophy   . URI (upper respiratory infection)    currently on cefdinir    PAST SURGICAL HISTORY: Past Surgical History:  Procedure Laterality Date  . [place]   exploratory lap  . BREAST REDUCTION SURGERY Bilateral 03/26/2015   Procedure: BILATERAL BREAST REDUCTION ;  Surgeon: Wallace Going, DO;  Location: [place];  Service: Plastics;  Laterality: Bilateral;  . CARPAL TUNNEL RELEASE     bilateral; Dr Sherwood Gambler  . COLONOSCOPY     in 1990s  . CYSTO/ RIGHT RETROGRADE  URETERAL PYELOGRAM  07-09-2004   HX BILATERAL RENAL STONES/ RIGHT FLANK PAIN  . CYSTOSCOPY W/ URETERAL STENT PLACEMENT  11/12/2011   Procedure: CYSTOSCOPY WITH RETROGRADE PYELOGRAM/URETERAL STENT PLACEMENT;  Surgeon: Alexis Frock, MD;  Location: [facility];  Service: Urology;  Laterality: Right;  . CYSTOSCOPY W/ URETERAL STENT REMOVAL  12/07/2011   Procedure: CYSTOSCOPY WITH STENT REMOVAL;  Surgeon: Alexis Frock, MD;  Location: [facility];  Service: Urology;  Laterality: Right;  . CYSTOSCOPY/RETROGRADE/URETEROSCOPY/STONE EXTRACTION WITH BASKET  12/07/2011   Procedure: CYSTOSCOPY/RETROGRADE/URETEROSCOPY/STONE EXTRACTION WITH BASKET;  Surgeon: Alexis Frock, MD;  Location: [place];  Service: Urology;  Laterality: Right;  . DILATION AND CURETTAGE OF UTERUS     X5  . EXCISION LEFT BARTHOLIN GLAND  02-05-2002  . EYE SURGERY     Retinal tears surgery bilateral  . LAPAROSCOPIC ASSISTED VAGINAL HYSTERECTOMY  2000  . LAPAROSCOPIC CHOLECYSTECTOMY  05-31-2007  . LAPAROSCOPIC LASER ABLATION ENDOMETRIOSIS AND LEFT SALPINGO-OOPHORECTOMY  11-03-1999  . LAPAROSCOPY WITH RIGHT SALPINGO-OOPHECTOMY/ LYSIS ADHESIONS AND ABLATION ENDOMETRIOSIS  05-17-2001  . LIPOSUCTION Bilateral 03/26/2015   Procedure: WITH LIPOSUCTION;  Surgeon: Wallace Going, DO;  Location: [place];  Service: Plastics;  Laterality: Bilateral;  . LUMBAR LAMINECTOMY  2003   L4 - L5  . PLANTAR FASCIA SURGERY     right  . PLANTAR FASCIA SURGERY  02/2011   left  . RIGHT URETEROSCOPIC STONE EXTRACTION  08-04-2000  . TONSILLECTOMY    . URETERAL STENT PLACEMENT  11/12/11   Dr Tresa Moore    SOCIAL HISTORY: Social History   Tobacco Use  . Smoking status: Never Smoker  . Smokeless tobacco: Never Used  Substance Use Topics  . Alcohol use: Yes    Alcohol/week: 1.0 standard drinks    Types: 1 Shots of liquor per week    Comment:  very rarely  . Drug use: No    FAMILY HISTORY: Family  History  Problem Relation Age of Onset  . Hypertension Mother   . Colon polyps Mother   . Atrial fibrillation Mother        Dr Angelena Form  . Hyperlipidemia Mother   . Heart disease Mother   . Anxiety disorder Mother   . Sleep apnea Mother   . Obesity Mother   . Heart attack Father 71  . Diabetes Father   . Hypertension Father   . Hyperlipidemia Father   . Heart disease Father   . Stroke Father   . Obesity Father   . Hypertension Sister   . Heart attack Paternal Grandmother 80  . Breast cancer Paternal Aunt   . Colon cancer Maternal Uncle   . Colon cancer Paternal Uncle   . Colon polyps Maternal Grandmother   . Stroke Maternal Grandmother 76  . Diabetes Paternal Grandfather   . Stroke Maternal Grandfather 37    ROS: Review of Systems  Constitutional: Negative for weight loss.  Gastrointestinal: Negative for nausea and vomiting.  Musculoskeletal:       Negative for muscle weakness Negative for muscle weakness  Endo/Heme/Allergies:       Positive for polyphagia Negative for hypoglycemia   Psychiatric/Behavioral: Positive for depression. Negative for suicidal ideas. The patient has insomnia.        Positive for irritability    PHYSICAL EXAM: Blood pressure 123/75, pulse 97, temperature 98.9 F (37.2 C), temperature source Oral, height 5\' 8"  (1.727 m), weight 236 lb (107 kg), SpO2 99 %. Body mass index is 35.88 kg/m. Physical Exam  Constitutional: She is oriented to person, place, and time. [name].  Cardiovascular: Normal rate.  Pulmonary/Chest: Effort normal.  Musculoskeletal: Normal range of motion.  Neurological: She is oriented to person, place, and time.  Skin: Skin is warm and dry.  Psychiatric: She expresses no homicidal and no suicidal ideation.  Vitals reviewed.   RECENT LABS AND TESTS: BMET    Component Value Date/Time   NA 144 07/04/2017 1216   K 4.2 07/04/2017 1216   CL 108 (H) 07/04/2017 1216   CO2 22  07/04/2017 1216  GLUCOSE 74 07/04/2017 1216   GLUCOSE 92 08/16/2016 1011   BUN 24 07/04/2017 1216   CREATININE 0.76 07/04/2017 1216   CALCIUM 9.5 07/04/2017 1216   GFRNONAA 94 07/04/2017 1216   GFRAA 108 07/04/2017 1216   Lab Results  Component Value Date   HGBA1C 5.8 (H) 07/04/2017   HGBA1C 5.6 02/06/2017   HGBA1C 5.6 09/28/2016   HGBA1C 5.9 08/16/2016   HGBA1C 5.9 06/04/2015   Lab Results  Component Value Date   INSULIN 10.4 07/04/2017   INSULIN 28.1 (H) 02/06/2017   INSULIN 14.7 09/28/2016   CBC    Component Value Date/Time   WBC 7.5 07/04/2017 1216   WBC 6.6 08/16/2016 1011   RBC 4.80 07/04/2017 1216   RBC 4.68 08/16/2016 1011   HGB 13.0 07/04/2017 1216   HCT 40.3 07/04/2017 1216   PLT 235.0 08/16/2016 1011   MCV 84 07/04/2017 1216   MCH 27.1 07/04/2017 1216   MCH 27.0 04/19/2015 0650   MCHC 32.3 07/04/2017 1216   MCHC 33.4 08/16/2016 1011   RDW 15.2 07/04/2017 1216   LYMPHSABS 2.4 07/04/2017 1216   MONOABS 0.6 08/16/2016 1011   EOSABS 0.2 07/04/2017 1216   BASOSABS 0.0 07/04/2017 1216   Iron/TIBC/Ferritin/ %Sat No results found for: IRON, TIBC, FERRITIN, IRONPCTSAT Lipid Panel     Component Value Date/Time   CHOL 158 07/04/2017 1216   TRIG 69 07/04/2017 1216   HDL 60 07/04/2017 1216   CHOLHDL 3 08/16/2016 1011   VLDL 12.2 08/16/2016 1011   LDLCALC 84 07/04/2017 1216   Hepatic Function Panel     Component Value Date/Time   PROT 6.6 07/04/2017 1216   ALBUMIN 4.1 07/04/2017 1216   AST 15 07/04/2017 1216   ALT 21 07/04/2017 1216   ALKPHOS 98 07/04/2017 1216   BILITOT 0.3 07/04/2017 1216   BILIDIR 0.1 06/12/2013 1632   IBILI 0.2 06/11/2009 2309      Component Value Date/Time   TSH 2.100 07/04/2017 1216   TSH 1.54 08/16/2016 1011   TSH 2.40 06/04/2015 0857   Results for AEON, KOORS (MRN [id number]) as of 11/09/2017 08:46  Ref. Range 07/04/2017 12:16  Vitamin D, 25-Hydroxy Latest Ref Range: 30.0 - 100.0 [name]/mL 49.7   ASSESSMENT AND  PLAN: Prediabetes - Plan: metFORMIN (GLUCOPHAGE) 500 MG tablet  Vitamin D deficiency - Plan: Vitamin D, Ergocalciferol, (DRISDOL) 50000 units CAPS capsule  Other depression - with emotional eating - Plan: buPROPion (WELLBUTRIN SR) 200 MG 12 hr tablet  Class 2 severe obesity with serious comorbidity and body mass index (BMI) of 35.0 to 35.9 in adult, unspecified obesity type ([place])  PLAN:  Pre-Diabetes [name] will continue to work on weight loss, exercise, and decreasing simple carbohydrates in her diet to help decrease the risk of diabetes. We dicussed metformin including benefits and risks. She was informed that eating too many simple carbohydrates or too many calories at one sitting increases the likelihood of GI side effects. Kinjal requested metformin for now and a prescription was written today for 1 month refill. We may try to change to GLP-1 at the next visit. [name] agreed to follow up with [place] as directed to monitor her progress.  Vitamin D Deficiency [name] was informed that low vitamin D levels contributes to fatigue and are associated with obesity, breast, and colon cancer. She agrees to continue to take prescription Vit D @50 ,000 IU every week #4 with no refills and will follow up for routine testing of vitamin D, at least 2-3  times per year. She was informed of the risk of over-replacement of vitamin D and agrees to not increase her dose unless she discusses this with [place] first.  Depression with Emotional Eating Behaviors We discussed behavior modification techniques today to help [name] deal with her emotional eating and depression. She has agreed to take Wellbutrin SR 150 mg qd and agreed to follow up as directed.  Obesity [name] is currently in the action stage of change. As such, her goal is to continue with weight loss efforts She has agreed to keep a food journal with 1400 calories and 90+ grams of protein  [name] has been instructed to work up to a goal of 150  minutes of combined cardio and strengthening exercise per week for weight loss and overall health benefits. We discussed the following Behavioral Modification Strategies today: increasing lean protein intake, increasing vegetables, decrease eating out and work on meal planning and easy cooking plans  [name] has agreed to follow up with our clinic in 2 to 3 weeks. She was informed of the importance of frequent follow up visits to maximize her success with intensive lifestyle modifications for her multiple health conditions.   OBESITY BEHAVIORAL INTERVENTION VISIT  Today's visit was # 21 out of 22.  Starting weight: 241 lbs Starting date: 09/28/16 Today's weight : 236 lbs Today's date: 11/07/2017 Total lbs lost to date: 5    ASK: We discussed the diagnosis of obesity with [name] today and [name] agreed to give [place] permission to discuss obesity behavioral modification therapy today.  ASSESS: Gertude has the diagnosis of obesity and her BMI today is 35.89 [name] is in the action stage of change   ADVISE: Derricka was educated on the multiple health risks of obesity as well as the benefit of weight loss to improve her health. She was advised of the need for long term treatment and the importance of lifestyle modifications.  AGREE: Multiple dietary modification options and treatment options were discussed and  Dafina agreed to the above obesity treatment plan.  I, [name], am acting as transcriptionist for Dennard Nip, MD  I have reviewed the above documentation for accuracy and completeness, and I agree with the above. -Dennard Nip, MD

## 2017-11-20 DIAGNOSIS — M533 Sacrococcygeal disorders, not elsewhere classified: Secondary | ICD-10-CM | POA: Diagnosis not present

## 2017-11-21 ENCOUNTER — Ambulatory Visit (INDEPENDENT_AMBULATORY_CARE_PROVIDER_SITE_OTHER): Payer: PPO | Admitting: Internal Medicine

## 2017-11-21 ENCOUNTER — Encounter: Payer: Self-pay | Admitting: Internal Medicine

## 2017-11-21 VITALS — BP 118/70 | HR 80 | Temp 99.0°F | Resp 16 | Ht 68.0 in | Wt 239.0 lb

## 2017-11-21 DIAGNOSIS — R05 Cough: Secondary | ICD-10-CM | POA: Diagnosis not present

## 2017-11-21 DIAGNOSIS — R21 Rash and other nonspecific skin eruption: Secondary | ICD-10-CM | POA: Diagnosis not present

## 2017-11-21 DIAGNOSIS — R058 Other specified cough: Secondary | ICD-10-CM | POA: Insufficient documentation

## 2017-11-21 MED ORDER — CLOBETASOL PROPIONATE 0.05 % EX CREA: 1.0000 "application " | TOPICAL_CREAM | Freq: Two times a day (BID) | CUTANEOUS | 0 refills | Status: DC

## 2017-11-21 NOTE — Assessment & Plan Note (Signed)
Probably allergic in nature given her numerous allergies, but cause unknown Consider seeing an allergist-she will let me know if she would like to do that Continue Singulair nightly Start Zyrtec nightly advised to continue this She would like to avoid oral steroids-we will use a topical steroid cream twice daily Call if no improvement

## 2017-11-21 NOTE — Progress Notes (Signed)
Subjective:    Patient ID: [name], female    DOB: 1970-10-09, 47 y.o.   MRN: [id number]  HPI The patient is here for an acute visit.  Rash:  She started having itching on the dorsal aspect of both feet last night.  She woke up and they were red and warm on the dorsal aspect of both feet.  She also has an area of redness and itchiness on her right cheek.  She has a ton of allergies and has had the symptoms in the past.  One year ago she had the same redness on her feet and it took months for it to completely resolve.  She did not see anyone at that time.  She denies any changes in any of her products-she is very careful because of all of her allergies.  She denies any new medications, supplements, food or anything in the environment.  Just over a month ago she had redness and scaly skin on both of her knees and that just recently resolved.  She has not seen her dermatologist.  She is not currently following with an allergist.  She had a steroid injection in her back yesterday, but has had steroid injections in the past.    She takes singulair nightly.  She is not taking an oral antihistamine.  Dry cough at night: She has been experiencing a dry cough at night.  She denies any significant cough during the day.  She does not think is related to the lisinopril because she has been on it for a long time.  She feels her heartburn is well controlled.  She takes the ranitidine daily as prescribed.  She does have some drainage, likely from allergies.  She only takes the Singulair nightly.  She has nasal spray, but does not use it on a regular basis and is currently not taking an oral antihistamine.  Medications and allergies reviewed with patient and updated if appropriate.  Patient Active Problem List   Diagnosis Date Noted  . Anxiety 08/17/2017  . 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 Other insomnia 06/06/2017  . Depression 05/16/2017  . Umbilical hernia [id number]/81/8299  . Diverticulosis of colon  02/26/2017  . Hematuria 02/16/2017  . Abdominal pain 02/16/2017  . Other hyperlipidemia 02/06/2017  . Asthma exacerbation, mild 11/24/2016  . Vitamin D deficiency 11/09/2016  . Class 2 obesity with serious comorbidity and body mass index (BMI) of 35.0 to 35.9 in adult 11/09/2016  . Right shoulder pain 10/11/2016  . AC (acromioclavicular) joint arthritis 09/13/2016  . Postsurgical menopause 08/16/2016  . Mucoid cyst of joint 11/04/2015  . External hemorrhoid 08/20/2015  . Prediabetes 06/05/2015  . GERD (gastroesophageal reflux disease) 06/04/2015  . Status post bilateral breast reduction 04/01/2015  . Cervical disc disorder with radiculopathy of cervical region 05/23/2014  . Neck pain 02/03/2014  . Ulnar neuropathy 01/16/2014  . Fibromyalgia 01/16/2014  . Recurrent nephrolithiasis 04/29/2013  . Arthralgia 04/29/2013  . Panic attacks 01/15/2013  . IBS (irritable bowel syndrome) 12/14/2011  . B12 deficiency 06/30/2009  . Hypothyroidism 03/02/2007  . Essential hypertension 03/02/2007    Current Outpatient Medications on File Prior to Visit  Medication Sig Dispense Refill  . aspirin 81 MG tablet Take 81 mg by mouth daily.     [name] buPROPion (WELLBUTRIN SR) 200 MG 12 hr tablet Take 1 tablet (200 mg total) by mouth 2 (two) times daily. 60 tablet 0  . citalopram (CELEXA) 20 MG tablet Take 1 tablet (20 mg total) by  mouth daily. 90 tablet 1  . Fluticasone-Salmeterol (ADVAIR DISKUS) 100-50 MCG/DOSE AEPB Inhale 1 puff into the lungs 2 (two) times daily. 60 each 5  . hydrochlorothiazide (MICROZIDE) 12.5 MG capsule TAKE 1 CAPSULE BY MOUTH EVERY DAY 90 capsule 2  . lisinopril (PRINIVIL,ZESTRIL) 40 MG tablet TAKE 1 TABLET BY MOUTH EVERY DAY 90 tablet 1  . metFORMIN (GLUCOPHAGE) 500 MG tablet Take 1 tablet (500 mg total) by mouth 2 (two) times daily. 60 tablet 0  . montelukast (SINGULAIR) 10 MG tablet TAKE 1 TABLET BY MOUTH EVERYDAY AT BEDTIME 90 tablet 0  . polyethylene glycol powder  (GLYCOLAX/MIRALAX) powder Take 17 g by mouth daily. 3350 g 0  . ranitidine (ZANTAC) 150 MG tablet Take 1 tablet (150 mg total) by mouth at bedtime. 90 tablet 3  . topiramate (TOPAMAX) 50 MG tablet Take 1 tablet (50 mg total) by mouth daily. 90 tablet 0  . Vitamin D, Ergocalciferol, (DRISDOL) 50000 units CAPS capsule Take 1 capsule (50,000 Units total) by mouth every 7 (seven) days. 4 capsule 0   No current facility-administered medications on file prior to visit.     Past Medical History:  Diagnosis Date  . Anemia   . Anxiety   . Arthritis    inflammitory  . Asthma   . B12 deficiency   . Back pain   . Chronic joint pain   . Constipation   . DDD (degenerative disc disease), lumbar    low back and neck and shoulders  . Depression    during divorce & legal matters  . Fatty liver   . Fibromyalgia   . Gallbladder problem   . GERD (gastroesophageal reflux disease)   . History of kidney stones    Dr Phebe Colla  . History of polycystic ovarian disease S/P BSO  . Hx of anxiety disorder   . Hypertension    a  . Hypothyroidism    no meds  . IBS (irritable bowel syndrome)   . IBS (irritable bowel syndrome)   . Infertility, female   . Joint pain   . Kidney problem   . Lactose intolerance   . Lupus (HCC) hx positive ANA   followed by Dr Gavin Pound; possible lupus  . Multiple food allergies   . Myalgia   . Osteoarthritis   . 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 POLYCYSTIC OVARIAN DISEASE 03/02/2007   Qualifier: Diagnosis of  By: Linna Darner MD, Rae Mar BSO for cysts & endometriosis; Dr Reino Kent, Gyn Seeing Dr Paula Compton    . PONV (postoperative nausea and vomiting)   . Prediabetes   . Sweating profusely   . Symptomatic mammary hypertrophy   . URI (upper respiratory infection)    currently on cefdinir    Past Surgical History:  Procedure Laterality Date  . [place]   exploratory lap  . BREAST REDUCTION SURGERY Bilateral  03/26/2015   Procedure: BILATERAL BREAST REDUCTION ;  Surgeon: Wallace Going, DO;  Location: [facility];  Service: Plastics;  Laterality: Bilateral;  . CARPAL TUNNEL RELEASE     bilateral; Dr Sherwood Gambler  . COLONOSCOPY     in 1990s  . CYSTO/ RIGHT RETROGRADE URETERAL PYELOGRAM  07-09-2004   HX BILATERAL RENAL STONES/ RIGHT FLANK PAIN  . CYSTOSCOPY W/ URETERAL STENT PLACEMENT  11/12/2011   Procedure: CYSTOSCOPY WITH RETROGRADE PYELOGRAM/URETERAL STENT PLACEMENT;  Surgeon: Alexis Frock, MD;  Location: [facility];  Service: Urology;  Laterality: Right;  . CYSTOSCOPY W/ URETERAL STENT REMOVAL  12/07/2011   Procedure: CYSTOSCOPY WITH STENT REMOVAL;  Surgeon: Alexis Frock, MD;  [facility];  Service: Urology;  Laterality: Right;  . CYSTOSCOPY/RETROGRADE/URETEROSCOPY/STONE EXTRACTION WITH BASKET  12/07/2011   Procedure: CYSTOSCOPY/RETROGRADE/URETEROSCOPY/STONE EXTRACTION WITH BASKET;  Surgeon: Alexis Frock, MD;  Location[facility];  Service: Urology;  Laterality: Right;  . DILATION AND CURETTAGE OF UTERUS     X5  . EXCISION LEFT BARTHOLIN GLAND  02-05-2002  . EYE SURGERY     Retinal tears surgery bilateral  . LAPAROSCOPIC ASSISTED VAGINAL HYSTERECTOMY  2000  . LAPAROSCOPIC CHOLECYSTECTOMY  05-31-2007  . LAPAROSCOPIC LASER ABLATION ENDOMETRIOSIS AND LEFT SALPINGO-OOPHORECTOMY  11-03-1999  . LAPAROSCOPY WITH RIGHT SALPINGO-OOPHECTOMY/ LYSIS ADHESIONS AND ABLATION ENDOMETRIOSIS  05-17-2001  . LIPOSUCTION Bilateral 03/26/2015   Procedure: WITH LIPOSUCTION;  Surgeon: Wallace Going, DO;  Location: [place];  Service: Plastics;  Laterality: Bilateral;  . LUMBAR LAMINECTOMY  2003   L4 - L5  . PLANTAR FASCIA SURGERY     right  . PLANTAR FASCIA SURGERY  02/2011   left  . RIGHT URETEROSCOPIC STONE EXTRACTION  08-04-2000  . TONSILLECTOMY    . URETERAL STENT PLACEMENT  11/12/11   Dr Tresa Moore    Social History    Socioeconomic History  . Marital status: Married    Spouse name: Darnelle Maffucci  . Number of children: 0  . Years of education: college  . Highest education level: Not on file  Occupational History  . Occupation: disabled  Social Needs  . Financial resource strain: Not on file  . Food insecurity:    Worry: Not on file    Inability: Not on file  . Transportation needs:    Medical: Not on file    Non-medical: Not on file  Tobacco Use  . Smoking status: Never Smoker  . Smokeless tobacco: Never Used  Substance and Sexual Activity  . Alcohol use: Yes    Alcohol/week: 1.0 standard drinks    Types: 1 Shots of liquor per week    Comment:  very rarely  . Drug use: No  . Sexual activity: Yes    Birth control/protection: Surgical  Lifestyle  . Physical activity:    Days per week: Not on file    Minutes per session: Not on file  . Stress: Not on file  Relationships  . Social connections:    Talks on phone: Not on file    Gets together: Not on file    Attends religious service: Not on file    Active member of club or organization: Not on file    Attends meetings of clubs or organizations: Not on file    Relationship status: Not on file  Other Topics Concern  . Not on file  Social History Narrative   Patient Lives at home with her husband Darnelle Maffucci)   Disabled.   Education two years of college.   Right handed.   Caffeine coffee and sweet tea. Not daily.    Family History  Problem Relation Age of Onset  . Hypertension Mother   . Colon polyps Mother   . Atrial fibrillation Mother        Dr Angelena Form  . Hyperlipidemia Mother   . Heart disease Mother   . Anxiety disorder Mother   . Sleep apnea Mother   . Obesity Mother   . Heart attack Father 22  . Diabetes Father   . Hypertension Father   .  Hyperlipidemia Father   . Heart disease Father   . Stroke Father   . Obesity Father   . Hypertension Sister   . Heart attack Paternal Grandmother 87  . Breast cancer Paternal Aunt    . Colon cancer Maternal Uncle   . Colon cancer Paternal Uncle   . Colon polyps Maternal Grandmother   . Stroke Maternal Grandmother 76  . Diabetes Paternal Grandfather   . Stroke Maternal Grandfather 37    Review of Systems  Constitutional: Negative for chills and fever.  HENT: Positive for postnasal drip.   Respiratory: Positive for cough (Dry cough, mostly at night). Negative for shortness of breath and wheezing.   Gastrointestinal:       GERD controlled  Skin: Positive for color change (Bilateral dorsal aspects of feet, right cheek-no other skin changes) and rash.       Objective:   Vitals:   11/21/17 1512  BP: 118/70  Pulse: 80  Resp: 16  Temp: 99 F (37.2 C)  SpO2: 98%   BP Readings from Last 3 Encounters:  11/21/17 118/70  11/07/17 123/75  10/16/17 105/74   Wt Readings from Last 3 Encounters:  11/21/17 239 lb (108.4 kg)  11/07/17 236 lb (107 kg)  10/16/17 235 lb (106.6 kg)   Body mass index is 36.34 kg/m.   Physical Exam  Constitutional: [name]. No distress.  HENT:  Head: Normocephalic and atraumatic.  Skin: Skin is warm and dry. She is not diaphoretic. There is erythema (Bilateral dorsal aspects of feet erythematous-slight warmth, no papules, blisters or ulcers.  Area nontender.  She has a similar erythematous patch on her right cheek.  No other skin changes or rashes).           Assessment & Plan:    See Problem List for Assessment and Plan of chronic medical problems.

## 2017-11-21 NOTE — Assessment & Plan Note (Signed)
Dry cough primarily at night Unlikely related to lisinopril since she does not have it during the day GERD is controlled She does have drainage and a lot of allergies, which is likely the cause Continue Singulair nightly Zyrtec nightly Start using Flonase daily

## 2017-11-21 NOTE — Patient Instructions (Addendum)
For the rash - Take zyrtec or xyzal at night in addition to your Singulair.  Apply the cream twice a day for the rash.   Consider seeing an allergist.     Start the nasal spray and zyrtec at night which may help the cough.

## 2017-11-27 ENCOUNTER — Ambulatory Visit (INDEPENDENT_AMBULATORY_CARE_PROVIDER_SITE_OTHER): Payer: PPO | Admitting: Family Medicine

## 2017-11-27 VITALS — BP 121/71 | HR 80 | Temp 99.0°F | Ht 68.0 in | Wt 236.0 lb

## 2017-11-27 DIAGNOSIS — Z6835 Body mass index (BMI) 35.0-35.9, adult: Secondary | ICD-10-CM

## 2017-11-27 DIAGNOSIS — M542 Cervicalgia: Secondary | ICD-10-CM | POA: Diagnosis not present

## 2017-11-27 DIAGNOSIS — E559 Vitamin D deficiency, unspecified: Secondary | ICD-10-CM | POA: Diagnosis not present

## 2017-11-27 DIAGNOSIS — F3289 Other specified depressive episodes: Secondary | ICD-10-CM

## 2017-11-27 DIAGNOSIS — R7303 Prediabetes: Secondary | ICD-10-CM

## 2017-11-27 DIAGNOSIS — F418 Other specified anxiety disorders: Secondary | ICD-10-CM

## 2017-11-27 MED ORDER — BUPROPION HCL ER (SR) 200 MG PO TB12: 200.0000 mg | ORAL_TABLET | Freq: Two times a day (BID) | ORAL | 0 refills | Status: DC

## 2017-11-27 MED ORDER — METFORMIN HCL 500 MG PO TABS: 500.0000 mg | ORAL_TABLET | Freq: Two times a day (BID) | ORAL | 0 refills | Status: DC

## 2017-11-27 MED ORDER — VITAMIN D (ERGOCALCIFEROL) 1.25 MG (50000 UNIT) PO CAPS: 50000.0000 [IU] | ORAL_CAPSULE | ORAL | 0 refills | Status: DC

## 2017-11-27 MED ORDER — TOPIRAMATE 50 MG PO TABS: 50.0000 mg | ORAL_TABLET | Freq: Every day | ORAL | 0 refills | Status: DC

## 2017-11-27 MED ORDER — LIRAGLUTIDE 18 MG/3ML ~~LOC~~ SOPN: 0.6000 mg | PEN_INJECTOR | SUBCUTANEOUS | 0 refills | Status: DC

## 2017-11-27 MED ORDER — INSULIN PEN NEEDLE 32G X 4 MM MISC: 1.0000 | Freq: Two times a day (BID) | 0 refills | Status: DC

## 2017-11-27 NOTE — Progress Notes (Signed)
Office: [phone]  /  Fax: [phone]   HPI:   Chief Complaint: OBESITY [name] is here to discuss her progress with her obesity treatment plan. She is on the keep a food journal with 1400 calories and 90+ grams of protein daily and is following her eating plan approximately 50 % of the time. She states she  Went to [employer] once and swimming twice since her last visit.  [name] was on a high dose of prednisone due to a lupus flare , but she did well with maintaining her weight. [name] is frustrated that she has plateaued, but she is struggling to keep a daily food journal. Her weight is 236 lb (107 kg) today and she has maintained weight over a period of 3 weeks since her last visit. She has lost 5 lbs since starting treatment with [place].  Pre-Diabetes [name] has a diagnosis of prediabetes based on her elevated Hgb A1c and was informed this puts her at greater risk of developing diabetes. She is stable on metformin, but she is still struggling with polyphagia. She continues to work on diet and exercise to decrease risk of diabetes. She denies nausea or hypoglycemia.  Vitamin D deficiency [name] has a diagnosis of vitamin D deficiency. She is stable on vit D and denies nausea, vomiting or muscle weakness.  Depression with emotional eating behaviors [name] is struggling with emotional eating and using food for comfort to the extent that it is negatively impacting her health. She often snacks when she is not hungry. [name] sometimes feels she is out of control and then feels guilty that she made poor food choices. She has been working on behavior modification techniques to help reduce her emotional eating and has been somewhat successful. She shows no sign of suicidal or homicidal ideations.  Depression screen [facility] 2/9 09/28/2016 07/29/2015 09/22/2014  Decreased Interest 2 0 0  Down, Depressed, Hopeless 1 0 0  PHQ - 2 Score 3 0 0  Altered sleeping 1 - -  Tired, decreased energy 2  - -  Change in appetite 1 - -  Feeling bad or failure about yourself  1 - -  Trouble concentrating 2 - -  Moving slowly or fidgety/restless 1 - -  Suicidal thoughts 0 - -  PHQ-9 Score 11 - -  Some recent data might be hidden      ALLERGIES: Allergies  Allergen Reactions  . Chloraprep One Step [Chlorhexidine Gluconate] Rash    Developed severe rash where chloraprep was used on chest area  . Ivp Dye [Iodinated Diagnostic Agents] Rash and Other (See Comments)    Flushing, minor facial rash & dyspnea  . Nalbuphine Shortness Of Breath and Rash    Nubain caused respiratory distress & rash  . Septra [Sulfamethoxazole-Trimethoprim] Shortness Of Breath and Rash  . Sulfamethoxazole-Trimethoprim Rash    rash  . Tylox [Oxycodone-Acetaminophen] Rash    MEDICATIONS: Current Outpatient Medications on File Prior to Visit  Medication Sig Dispense Refill  . aspirin 81 MG tablet Take 81 mg by mouth daily.     . citalopram (CELEXA) 20 MG tablet Take 1 tablet (20 mg total) by mouth daily. 90 tablet 1  . clobetasol cream (TEMOVATE) 5.09 % Apply 1 application topically 2 (two) times daily. 30 g 0  . Fluticasone-Salmeterol (ADVAIR DISKUS) 100-50 MCG/DOSE AEPB Inhale 1 puff into the lungs 2 (two) times daily. 60 each 5  . hydrochlorothiazide (MICROZIDE) 12.5 MG capsule TAKE 1 CAPSULE BY MOUTH EVERY DAY 90 capsule 2  .  lisinopril (PRINIVIL,ZESTRIL) 40 MG tablet TAKE 1 TABLET BY MOUTH EVERY DAY 90 tablet 1  . montelukast (SINGULAIR) 10 MG tablet TAKE 1 TABLET BY MOUTH EVERYDAY AT BEDTIME 90 tablet 0  . polyethylene glycol powder (GLYCOLAX/MIRALAX) powder Take 17 g by mouth daily. 3350 g 0  . ranitidine (ZANTAC) 150 MG tablet Take 1 tablet (150 mg total) by mouth at bedtime. 90 tablet 3  . topiramate (TOPAMAX) 50 MG tablet Take 1 tablet (50 mg total) by mouth daily. 90 tablet 0   No current facility-administered medications on file prior to visit.     PAST MEDICAL HISTORY: Past Medical History:    Diagnosis Date  . Anemia   . Anxiety   . Arthritis    inflammitory  . Asthma   . B12 deficiency   . Back pain   . Chronic joint pain   . Constipation   . DDD (degenerative disc disease), lumbar    low back and neck and shoulders  . Depression    during divorce & legal matters  . Fatty liver   . Fibromyalgia   . Gallbladder problem   . GERD (gastroesophageal reflux disease)   . History of kidney stones    Dr Phebe Colla  . History of polycystic ovarian disease S/P BSO  . Hx of anxiety disorder   . Hypertension    a  . Hypothyroidism    no meds  . IBS (irritable bowel syndrome)   . IBS (irritable bowel syndrome)   . Infertility, female   . Joint pain   . Kidney problem   . Lactose intolerance   . Lupus (HCC) hx positive ANA   followed by Dr Gavin Pound; possible lupus  . Multiple food allergies   . Myalgia   . Osteoarthritis   . 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 POLYCYSTIC OVARIAN DISEASE 03/02/2007   Qualifier: Diagnosis of  By: Linna Darner MD, Rae Mar BSO for cysts & endometriosis; Dr Reino Kent, Gyn Seeing Dr Paula Compton    . PONV (postoperative nausea and vomiting)   . Prediabetes   . Sweating profusely   . Symptomatic mammary hypertrophy   . URI (upper respiratory infection)    currently on cefdinir    PAST SURGICAL HISTORY: Past Surgical History:  Procedure Laterality Date  . [place]   exploratory lap  . BREAST REDUCTION SURGERY Bilateral 03/26/2015   Procedure: BILATERAL BREAST REDUCTION ;  Surgeon: Wallace Going, DO;  Location: [place];  Service: Plastics;  Laterality: Bilateral;  . CARPAL TUNNEL RELEASE     bilateral; Dr Sherwood Gambler  . COLONOSCOPY     in 1990s  . CYSTO/ RIGHT RETROGRADE URETERAL PYELOGRAM  07-09-2004   HX BILATERAL RENAL STONES/ RIGHT FLANK PAIN  . CYSTOSCOPY W/ URETERAL STENT PLACEMENT  11/12/2011   Procedure: CYSTOSCOPY WITH RETROGRADE PYELOGRAM/URETERAL STENT  PLACEMENT;  Surgeon: Alexis Frock, MD;  Location: [facility];  Service: Urology;  Laterality: Right;  . CYSTOSCOPY W/ URETERAL STENT REMOVAL  12/07/2011   Procedure: CYSTOSCOPY WITH STENT REMOVAL;  Surgeon: Alexis Frock, MD;  Location[facility];  Service: Urology;  Laterality: Right;  . CYSTOSCOPY/RETROGRADE/URETEROSCOPY/STONE EXTRACTION WITH BASKET  12/07/2011   Procedure: CYSTOSCOPY/RETROGRADE/URETEROSCOPY/STONE EXTRACTION WITH BASKET;  Surgeon: Alexis Frock, MD;  Location: [facility];  Service: Urology;  Laterality: Right;  . DILATION AND CURETTAGE OF UTERUS     X5  . EXCISION LEFT BARTHOLIN  GLAND  02-05-2002  . EYE SURGERY     Retinal tears surgery bilateral  . LAPAROSCOPIC ASSISTED VAGINAL HYSTERECTOMY  2000  . LAPAROSCOPIC CHOLECYSTECTOMY  05-31-2007  . LAPAROSCOPIC LASER ABLATION ENDOMETRIOSIS AND LEFT SALPINGO-OOPHORECTOMY  11-03-1999  . LAPAROSCOPY WITH RIGHT SALPINGO-OOPHECTOMY/ LYSIS ADHESIONS AND ABLATION ENDOMETRIOSIS  05-17-2001  . LIPOSUCTION Bilateral 03/26/2015   Procedure: WITH LIPOSUCTION;  Surgeon: Wallace Going, DO;  Location: [place];  Service: Plastics;  Laterality: Bilateral;  . LUMBAR LAMINECTOMY  2003   L4 - L5  . PLANTAR FASCIA SURGERY     right  . PLANTAR FASCIA SURGERY  02/2011   left  . RIGHT URETEROSCOPIC STONE EXTRACTION  08-04-2000  . TONSILLECTOMY    . URETERAL STENT PLACEMENT  11/12/11   Dr Tresa Moore    SOCIAL HISTORY: Social History   Tobacco Use  . Smoking status: Never Smoker  . Smokeless tobacco: Never Used  Substance Use Topics  . Alcohol use: Yes    Alcohol/week: 1.0 standard drinks    Types: 1 Shots of liquor per week    Comment:  very rarely  . Drug use: No    FAMILY HISTORY: Family History  Problem Relation Age of Onset  . Hypertension Mother   . Colon polyps Mother   . Atrial fibrillation Mother        Dr Angelena Form  . Hyperlipidemia Mother   . Heart disease Mother   .  Anxiety disorder Mother   . Sleep apnea Mother   . Obesity Mother   . Heart attack Father 33  . Diabetes Father   . Hypertension Father   . Hyperlipidemia Father   . Heart disease Father   . Stroke Father   . Obesity Father   . Hypertension Sister   . Heart attack Paternal Grandmother 57  . Breast cancer Paternal Aunt   . Colon cancer Maternal Uncle   . Colon cancer Paternal Uncle   . Colon polyps Maternal Grandmother   . Stroke Maternal Grandmother 76  . Diabetes Paternal Grandfather   . Stroke Maternal Grandfather 37    ROS: Review of Systems  Constitutional: Negative for weight loss.  Gastrointestinal: Negative for nausea and vomiting.  Musculoskeletal:       Negative for muscle weakness  Endo/Heme/Allergies:       Positive for polyphagia Negative for hypoglycemia  Psychiatric/Behavioral: Positive for depression. Negative for suicidal ideas.    PHYSICAL EXAM: Blood pressure 121/71, pulse 80, temperature 99 F (37.2 C), temperature source Oral, height 5\' 8"  (1.727 m), weight 236 lb (107 kg), SpO2 99 %. Body mass index is 35.88 kg/m. Physical Exam  Constitutional: She is oriented to person, place, and time. [name].  Cardiovascular: Normal rate.  Pulmonary/Chest: Effort normal.  Musculoskeletal: Normal range of motion.  Neurological: She is oriented to person, place, and time.  Skin: Skin is warm and dry.  Psychiatric: She has a normal mood and affect. Her behavior is normal.  Vitals reviewed.   RECENT LABS AND TESTS: BMET    Component Value Date/Time   NA 144 07/04/2017 1216   K 4.2 07/04/2017 1216   CL 108 (H) 07/04/2017 1216   CO2 22 07/04/2017 1216   GLUCOSE 74 07/04/2017 1216   GLUCOSE 92 08/16/2016 1011   BUN 24 07/04/2017 1216   CREATININE 0.76 07/04/2017 1216   CALCIUM 9.5 07/04/2017 1216   GFRNONAA 94 07/04/2017 1216   GFRAA 108 07/04/2017 1216   Lab Results  Component Value Date   HGBA1C 5.8 (H)  07/04/2017   HGBA1C 5.6 02/06/2017   HGBA1C 5.6 09/28/2016   HGBA1C 5.9 08/16/2016   HGBA1C 5.9 06/04/2015   Lab Results  Component Value Date   INSULIN 10.4 07/04/2017   INSULIN 28.1 (H) 02/06/2017   INSULIN 14.7 09/28/2016   CBC    Component Value Date/Time   WBC 7.5 07/04/2017 1216   WBC 6.6 08/16/2016 1011   RBC 4.80 07/04/2017 1216   RBC 4.68 08/16/2016 1011   HGB 13.0 07/04/2017 1216   HCT 40.3 07/04/2017 1216   PLT 235.0 08/16/2016 1011   MCV 84 07/04/2017 1216   MCH 27.1 07/04/2017 1216   MCH 27.0 04/19/2015 0650   MCHC 32.3 07/04/2017 1216   MCHC 33.4 08/16/2016 1011   RDW 15.2 07/04/2017 1216   LYMPHSABS 2.4 07/04/2017 1216   MONOABS 0.6 08/16/2016 1011   EOSABS 0.2 07/04/2017 1216   BASOSABS 0.0 07/04/2017 1216   Iron/TIBC/Ferritin/ %Sat No results found for: IRON, TIBC, FERRITIN, IRONPCTSAT Lipid Panel     Component Value Date/Time   CHOL 158 07/04/2017 1216   TRIG 69 07/04/2017 1216   HDL 60 07/04/2017 1216   CHOLHDL 3 08/16/2016 1011   VLDL 12.2 08/16/2016 1011   LDLCALC 84 07/04/2017 1216   Hepatic Function Panel     Component Value Date/Time   PROT 6.6 07/04/2017 1216   ALBUMIN 4.1 07/04/2017 1216   AST 15 07/04/2017 1216   ALT 21 07/04/2017 1216   ALKPHOS 98 07/04/2017 1216   BILITOT 0.3 07/04/2017 1216   BILIDIR 0.1 06/12/2013 1632   IBILI 0.2 06/11/2009 2309      Component Value Date/Time   TSH 2.100 07/04/2017 1216   TSH 1.54 08/16/2016 1011   TSH 2.40 06/04/2015 0857   Results for LESHAE, MCCLAY (MRN [id number]) as of 11/27/2017 16:19  Ref. Range 07/04/2017 12:16  Vitamin D, 25-Hydroxy Latest Ref Range: 30.0 - 100.0 ng/mL 49.7   ASSESSMENT AND PLAN: Prediabetes - Plan: liraglutide (VICTOZA) 18 MG/3ML SOPN, metFORMIN (GLUCOPHAGE) 500 MG tablet, Insulin Pen Needle (BD PEN NEEDLE NANO 2ND GEN) 32G X 4 MM MISC  Vitamin D deficiency - Plan: Vitamin D, Ergocalciferol, (DRISDOL) 50000 units CAPS capsule  Other depression - with  emotional eating - Plan: buPROPion (WELLBUTRIN SR) 200 MG 12 hr tablet  Class 2 severe obesity with serious comorbidity and body mass index (BMI) of 35.0 to 35.9 in adult, unspecified obesity type ([place])  PLAN:  Pre-Diabetes Nailyn will continue to work on weight loss, exercise, and decreasing simple carbohydrates in her diet to help decrease the risk of diabetes. We dicussed metformin including benefits and risks. She was informed that eating too many simple carbohydrates or too many calories at one sitting increases the likelihood of GI side effects. Tammara agreed to start victoza 0.6 mg qAM #1 pen and nano needles with no refills. She agreed to continue metformin 500 mg BID #60 with no refills and follow up with [place] as directed to monitor her progress.  Vitamin D Deficiency Joel was informed that low vitamin D levels contributes to fatigue and are associated with obesity, breast, and colon cancer. She agrees to continue to take prescription Vit D @50 ,000 IU every week #4 with no refills and will follow up for routine testing of vitamin D, at least 2-3 times per year. She was informed of the risk of over-replacement of vitamin D and agrees to not increase her dose unless she discusses this  with [place] first. Venissa agrees to follow up as directed.  Depression with Emotional Eating Behaviors We discussed behavior modification techniques today to help Caralynn deal with her emotional eating and depression. She has agreed to take Wellbutrin SR 200 mg twice daily and Topamax 50 mg #30 with no refills and follow up as directed.  Obesity Avri is currently in the action stage of change. As such, her goal is to continue with weight loss efforts She has agreed to keep a food journal with 1400 calories and 90+ grams of protein daily  Monty has been instructed to work up to a goal of 150 minutes of combined cardio and strengthening exercise per week for weight loss and overall health  benefits. We discussed the following Behavioral Modification Strategies today: increasing lean protein intake, decreasing simple carbohydrates  and emotional eating strategies  Renelda has agreed to follow up with our clinic in 3 weeks. She was informed of the importance of frequent follow up visits to maximize her success with intensive lifestyle modifications for her multiple health conditions.   OBESITY BEHAVIORAL INTERVENTION VISIT  Today's visit was # 22   Starting weight: 241 lbs Starting date: 09/28/16 Today's weight : 236 lbs  Today's date: 11/27/2017 Total lbs lost to date: 5 At least 15 minutes were spent on discussing the following behavioral intervention visit.   ASK: We discussed the diagnosis of obesity with Anne Ng today and Margan agreed to give [place] permission to discuss obesity behavioral modification therapy today.  ASSESS: Jackqulyn has the diagnosis of obesity and her BMI today is 35.89 Treasa is in the action stage of change   ADVISE: Camila was educated on the multiple health risks of obesity as well as the benefit of weight loss to improve her health. She was advised of the need for long term treatment and the importance of lifestyle modifications to improve her current health and to decrease her risk of future health problems.  AGREE: Multiple dietary modification options and treatment options were discussed and  Kassidie agreed to follow the recommendations documented in the above note.  ARRANGE: Manie was educated on the importance of frequent visits to treat obesity as outlined per CMS and USPSTF guidelines and agreed to schedule her next follow up appointment today.  I, Doreene Nest, am acting as transcriptionist for Dennard Nip, MD  I have reviewed the above documentation for accuracy and completeness, and I agree with the above. -Dennard Nip, MD

## 2017-12-05 ENCOUNTER — Telehealth (INDEPENDENT_AMBULATORY_CARE_PROVIDER_SITE_OTHER): Payer: Self-pay | Admitting: Family Medicine

## 2017-12-05 ENCOUNTER — Encounter (INDEPENDENT_AMBULATORY_CARE_PROVIDER_SITE_OTHER): Payer: Self-pay

## 2017-12-05 NOTE — Telephone Encounter (Signed)
Patient called, stated that the new injection she was given is causing her to be very nauseated, but it's working.  She doesn't want to stop the injections, but wants to know if this is normal.  If so, she'd like something for the nausea.  [facility] in [place]

## 2017-12-05 NOTE — Telephone Encounter (Signed)
Sent the pt a mychart message. Buffi Ewton, CMA

## 2017-12-06 DIAGNOSIS — M62838 Other muscle spasm: Secondary | ICD-10-CM | POA: Diagnosis not present

## 2017-12-06 DIAGNOSIS — M79621 Pain in right upper arm: Secondary | ICD-10-CM | POA: Diagnosis not present

## 2017-12-18 ENCOUNTER — Other Ambulatory Visit (INDEPENDENT_AMBULATORY_CARE_PROVIDER_SITE_OTHER): Payer: Self-pay | Admitting: Family Medicine

## 2017-12-18 DIAGNOSIS — M79621 Pain in right upper arm: Secondary | ICD-10-CM | POA: Diagnosis not present

## 2017-12-18 DIAGNOSIS — E559 Vitamin D deficiency, unspecified: Secondary | ICD-10-CM

## 2017-12-18 DIAGNOSIS — M62838 Other muscle spasm: Secondary | ICD-10-CM | POA: Diagnosis not present

## 2017-12-20 ENCOUNTER — Ambulatory Visit (INDEPENDENT_AMBULATORY_CARE_PROVIDER_SITE_OTHER): Payer: PPO | Admitting: Family Medicine

## 2017-12-20 VITALS — BP 101/64 | HR 71 | Ht 68.0 in | Wt 230.0 lb

## 2017-12-20 DIAGNOSIS — Z9189 Other specified personal risk factors, not elsewhere classified: Secondary | ICD-10-CM | POA: Diagnosis not present

## 2017-12-20 DIAGNOSIS — R7303 Prediabetes: Secondary | ICD-10-CM

## 2017-12-20 DIAGNOSIS — Z6835 Body mass index (BMI) 35.0-35.9, adult: Secondary | ICD-10-CM | POA: Diagnosis not present

## 2017-12-20 MED ORDER — METFORMIN HCL 500 MG PO TABS: 500.0000 mg | ORAL_TABLET | Freq: Two times a day (BID) | ORAL | 0 refills | Status: DC

## 2017-12-21 NOTE — Progress Notes (Signed)
Office: [phone]  /  Fax: [phone]   HPI:   Chief Complaint: OBESITY [name] is here to discuss her progress with her obesity treatment plan. She is on the keep a food journal with 1400 calories and 90+ grams of protein daily and is following her eating plan approximately 85 % of the time. She states she is walking and doing strength training for 30 minutes 5 times per week. [name] has been doing better with journaling and working on increasing protein, but much of her protein is from protein supplement.  Her weight is 230 lb (104.3 kg) today and has had a weight loss of 6 pounds over a period of 3 weeks since her last visit. She has lost 11 lbs since starting treatment with [place].  Pre-Diabetes [name] has a diagnosis of pre-diabetes based on her elevated Hgb A1c and was informed this puts her at greater risk of developing diabetes. She is on metformin and Victoza, her last A1c was 5.8 and she notes decreased polyphagia. She is working on decreasing simple carbohydrates and continues to work on diet and exercise to decrease risk of diabetes. She denies nausea or hypoglycemia.  At risk for diabetes [name] is at higher than average risk for developing diabetes due to her obesity and pre-diabetes. She currently denies polyuria or polydipsia.  ALLERGIES: Allergies  Allergen Reactions  . Chloraprep One Step [Chlorhexidine Gluconate] Rash    Developed severe rash where chloraprep was used on chest area  . Ivp Dye [Iodinated Diagnostic Agents] Rash and Other (See Comments)    Flushing, minor facial rash & dyspnea  . Nalbuphine Shortness Of Breath and Rash    Nubain caused respiratory distress & rash  . Septra [Sulfamethoxazole-Trimethoprim] Shortness Of Breath and Rash  . Sulfamethoxazole-Trimethoprim Rash    rash  . Tylox [Oxycodone-Acetaminophen] Rash    MEDICATIONS: Current Outpatient Medications on File Prior to Visit  Medication Sig Dispense Refill  . aspirin 81 MG  tablet Take 81 mg by mouth daily.     [name] buPROPion (WELLBUTRIN SR) 200 MG 12 hr tablet Take 1 tablet (200 mg total) by mouth 2 (two) times daily. 60 tablet 0  . citalopram (CELEXA) 20 MG tablet Take 1 tablet (20 mg total) by mouth daily. 90 tablet 1  . clobetasol cream (TEMOVATE) 4.97 % Apply 1 application topically 2 (two) times daily. 30 g 0  . Fluticasone-Salmeterol (ADVAIR DISKUS) 100-50 MCG/DOSE AEPB Inhale 1 puff into the lungs 2 (two) times daily. 60 each 5  . hydrochlorothiazide (MICROZIDE) 12.5 MG capsule TAKE 1 CAPSULE BY MOUTH EVERY DAY 90 capsule 2  . Insulin Pen Needle (BD PEN NEEDLE NANO 2ND GEN) 32G X 4 MM MISC 1 Package by Does not apply route 2 (two) times daily. 100 each 0  . liraglutide (VICTOZA) 18 MG/3ML SOPN Inject 0.1 mLs (0.6 mg total) into the skin every morning. 1 pen 0  . lisinopril (PRINIVIL,ZESTRIL) 40 MG tablet TAKE 1 TABLET BY MOUTH EVERY DAY 90 tablet 1  . montelukast (SINGULAIR) 10 MG tablet TAKE 1 TABLET BY MOUTH EVERYDAY AT BEDTIME 90 tablet 0  . polyethylene glycol powder (GLYCOLAX/MIRALAX) powder Take 17 g by mouth daily. 3350 g 0  . ranitidine (ZANTAC) 150 MG tablet Take 1 tablet (150 mg total) by mouth at bedtime. 90 tablet 3  . topiramate (TOPAMAX) 50 MG tablet Take 1 tablet (50 mg total) by mouth daily. 30 tablet 0  . Vitamin D, Ergocalciferol, (DRISDOL) 50000 units CAPS capsule Take 1 capsule (  50,000 Units total) by mouth every 7 (seven) days. 4 capsule 0   No current facility-administered medications on file prior to visit.     PAST MEDICAL HISTORY: Past Medical History:  Diagnosis Date  . Anemia   . Anxiety   . Arthritis    inflammitory  . Asthma   . B12 deficiency   . Back pain   . Chronic joint pain   . Constipation   . DDD (degenerative disc disease), lumbar    low back and neck and shoulders  . Depression    during divorce & legal matters  . Fatty liver   . Fibromyalgia   . Gallbladder problem   . GERD (gastroesophageal reflux  disease)   . History of kidney stones    Dr Phebe Colla  . History of polycystic ovarian disease S/P BSO  . Hx of anxiety disorder   . Hypertension    a  . Hypothyroidism    no meds  . IBS (irritable bowel syndrome)   . IBS (irritable bowel syndrome)   . Infertility, female   . Joint pain   . Kidney problem   . Lactose intolerance   . Lupus (HCC) hx positive ANA   followed by Dr Gavin Pound; possible lupus  . Multiple food allergies   . Myalgia   . Osteoarthritis   . 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 POLYCYSTIC OVARIAN DISEASE 03/02/2007   Qualifier: Diagnosis of  By: Linna Darner MD, Rae Mar BSO for cysts & endometriosis; Dr Reino Kent, Gyn Seeing Dr Paula Compton    . PONV (postoperative nausea and vomiting)   . Prediabetes   . Sweating profusely   . Symptomatic mammary hypertrophy   . URI (upper respiratory infection)    currently on cefdinir    PAST SURGICAL HISTORY: Past Surgical History:  Procedure Laterality Date  . Flowella   exploratory lap  . BREAST REDUCTION SURGERY Bilateral 03/26/2015   Procedure: BILATERAL BREAST REDUCTION ;  Surgeon: Wallace Going, DO;  Location: [facility];  Service: Plastics;  Laterality: Bilateral;  . CARPAL TUNNEL RELEASE     bilateral; Dr Sherwood Gambler  . COLONOSCOPY     in 1990s  . CYSTO/ RIGHT RETROGRADE URETERAL PYELOGRAM  07-09-2004   HX BILATERAL RENAL STONES/ RIGHT FLANK PAIN  . CYSTOSCOPY W/ URETERAL STENT PLACEMENT  11/12/2011   Procedure: CYSTOSCOPY WITH RETROGRADE PYELOGRAM/URETERAL STENT PLACEMENT;  Surgeon: Alexis Frock, MD;  Location: [facility];  Service: Urology;  Laterality: Right;  . CYSTOSCOPY W/ URETERAL STENT REMOVAL  12/07/2011   Procedure: CYSTOSCOPY WITH STENT REMOVAL;  Surgeon: Alexis Frock, MD;  Location[facility];  Service: Urology;  Laterality: Right;  . CYSTOSCOPY/RETROGRADE/URETEROSCOPY/STONE EXTRACTION WITH BASKET  12/07/2011    Procedure: CYSTOSCOPY/RETROGRADE/URETEROSCOPY/STONE EXTRACTION WITH BASKET;  Surgeon: Alexis Frock, MD;  Location: [facility];  Service: Urology;  Laterality: Right;  . DILATION AND CURETTAGE OF UTERUS     X5  . EXCISION LEFT BARTHOLIN GLAND  02-05-2002  . EYE SURGERY     Retinal tears surgery bilateral  . LAPAROSCOPIC ASSISTED VAGINAL HYSTERECTOMY  2000  . LAPAROSCOPIC CHOLECYSTECTOMY  05-31-2007  . LAPAROSCOPIC LASER ABLATION ENDOMETRIOSIS AND LEFT SALPINGO-OOPHORECTOMY  11-03-1999  . LAPAROSCOPY WITH RIGHT SALPINGO-OOPHECTOMY/ LYSIS ADHESIONS AND ABLATION ENDOMETRIOSIS  05-17-2001  . LIPOSUCTION Bilateral 03/26/2015   Procedure: WITH LIPOSUCTION;  Surgeon: Wallace Going, DO;  Location: [place];  Service:  Plastics;  Laterality: Bilateral;  . LUMBAR LAMINECTOMY  2003   L4 - L5  . PLANTAR FASCIA SURGERY     right  . PLANTAR FASCIA SURGERY  02/2011   left  . RIGHT URETEROSCOPIC STONE EXTRACTION  08-04-2000  . TONSILLECTOMY    . URETERAL STENT PLACEMENT  11/12/11   Dr Tresa Moore    SOCIAL HISTORY: Social History   Tobacco Use  . Smoking status: Never Smoker  . Smokeless tobacco: Never Used  Substance Use Topics  . Alcohol use: Yes    Alcohol/week: 1.0 standard drinks    Types: 1 Shots of liquor per week    Comment:  very rarely  . Drug use: No    FAMILY HISTORY: Family History  Problem Relation Age of Onset  . Hypertension Mother   . Colon polyps Mother   . Atrial fibrillation Mother        Dr Angelena Form  . Hyperlipidemia Mother   . Heart disease Mother   . Anxiety disorder Mother   . Sleep apnea Mother   . Obesity Mother   . Heart attack Father 12  . Diabetes Father   . Hypertension Father   . Hyperlipidemia Father   . Heart disease Father   . Stroke Father   . Obesity Father   . Hypertension Sister   . Heart attack Paternal Grandmother 77  . Breast cancer Paternal Aunt   . Colon cancer Maternal Uncle   . Colon cancer  Paternal Uncle   . Colon polyps Maternal Grandmother   . Stroke Maternal Grandmother 76  . Diabetes Paternal Grandfather   . Stroke Maternal Grandfather 37    ROS: Review of Systems  Constitutional: Positive for weight loss.  Gastrointestinal: Negative for nausea.  Genitourinary: Negative for frequency.  Endo/Heme/Allergies: Negative for polydipsia.       Positive polyphagia Negative hypoglycemia    PHYSICAL EXAM: Blood pressure 101/64, pulse 71, height 5\' 8"  (1.727 m), weight 230 lb (104.3 kg), SpO2 99 %. Body mass index is 34.97 kg/m. Physical Exam  Constitutional: She is oriented to person, place, and time. [name].  Cardiovascular: Normal rate.  Pulmonary/Chest: Effort normal.  Musculoskeletal: Normal range of motion.  Neurological: She is oriented to person, place, and time.  Skin: Skin is warm and dry.  Psychiatric: She has a normal mood and affect. Her behavior is normal.  Vitals reviewed.   RECENT LABS AND TESTS: BMET    Component Value Date/Time   NA 144 07/04/2017 1216   K 4.2 07/04/2017 1216   CL 108 (H) 07/04/2017 1216   CO2 22 07/04/2017 1216   GLUCOSE 74 07/04/2017 1216   GLUCOSE 92 08/16/2016 1011   BUN 24 07/04/2017 1216   CREATININE 0.76 07/04/2017 1216   CALCIUM 9.5 07/04/2017 1216   GFRNONAA 94 07/04/2017 1216   GFRAA 108 07/04/2017 1216   Lab Results  Component Value Date   HGBA1C 5.8 (H) 07/04/2017   HGBA1C 5.6 02/06/2017   HGBA1C 5.6 09/28/2016   HGBA1C 5.9 08/16/2016   HGBA1C 5.9 06/04/2015   Lab Results  Component Value Date   INSULIN 10.4 07/04/2017   INSULIN 28.1 (H) 02/06/2017   INSULIN 14.7 09/28/2016   CBC    Component Value Date/Time   WBC 7.5 07/04/2017 1216   WBC 6.6 08/16/2016 1011   RBC 4.80 07/04/2017 1216   RBC 4.68 08/16/2016 1011   HGB 13.0 07/04/2017 1216   HCT 40.3 07/04/2017 1216   PLT 235.0  08/16/2016 1011   MCV 84 07/04/2017 1216   MCH 27.1 07/04/2017 1216   MCH  27.0 04/19/2015 0650   MCHC 32.3 07/04/2017 1216   MCHC 33.4 08/16/2016 1011   RDW 15.2 07/04/2017 1216   LYMPHSABS 2.4 07/04/2017 1216   MONOABS 0.6 08/16/2016 1011   EOSABS 0.2 07/04/2017 1216   BASOSABS 0.0 07/04/2017 1216   Iron/TIBC/Ferritin/ %Sat No results found for: IRON, TIBC, FERRITIN, IRONPCTSAT Lipid Panel     Component Value Date/Time   CHOL 158 07/04/2017 1216   TRIG 69 07/04/2017 1216   HDL 60 07/04/2017 1216   CHOLHDL 3 08/16/2016 1011   VLDL 12.2 08/16/2016 1011   LDLCALC 84 07/04/2017 1216   Hepatic Function Panel     Component Value Date/Time   PROT 6.6 07/04/2017 1216   ALBUMIN 4.1 07/04/2017 1216   AST 15 07/04/2017 1216   ALT 21 07/04/2017 1216   ALKPHOS 98 07/04/2017 1216   BILITOT 0.3 07/04/2017 1216   BILIDIR 0.1 06/12/2013 1632   IBILI 0.2 06/11/2009 2309      Component Value Date/Time   TSH 2.100 07/04/2017 1216   TSH 1.54 08/16/2016 1011   TSH 2.40 06/04/2015 0857    ASSESSMENT AND PLAN: Prediabetes - Plan: metFORMIN (GLUCOPHAGE) 500 MG tablet  At risk for diabetes mellitus  Class 2 severe obesity with serious comorbidity and body mass index (BMI) of 35.0 to 35.9 in adult, unspecified obesity type ([place])  PLAN:  Pre-Diabetes Nilda will continue to work on weight loss, diet, exercise, and decreasing simple carbohydrates in her diet to help decrease the risk of diabetes. We dicussed metformin including benefits and risks. She was informed that eating too many simple carbohydrates or too many calories at one sitting increases the likelihood of GI side effects. Lenox agrees to continue taking metformin 500 mg BID #60 and we will refill for 1 month, and she agrees to continue Victoza at 0.6 mg, no refill needed. Shantera agrees to follow up with our clinic in 3 weeks as directed to monitor her progress.  Diabetes risk counselling Eva was given extended (15 minutes) diabetes prevention counseling today. She is 47 y.o. female and has  risk factors for diabetes including obesity and pre-diabetes. We discussed intensive lifestyle modifications today with an emphasis on weight loss as well as increasing exercise and decreasing simple carbohydrates in her diet.  Obesity Cailin is currently in the action stage of change. As such, her goal is to continue with weight loss efforts She has agreed to keep a food journal with 1600 calories and 90+ grams of protein daily Kimi has been instructed to work up to a goal of 150 minutes of combined cardio and strengthening exercise per week for weight loss and overall health benefits. We discussed the following Behavioral Modification Strategies today: increasing lean protein intake Xinyi is to work on increasing protein from [employer]" and less supplements.  Sapir has agreed to follow up with our clinic in 3 weeks. She was informed of the importance of frequent follow up visits to maximize her success with intensive lifestyle modifications for her multiple health conditions.   OBESITY BEHAVIORAL INTERVENTION VISIT  Today's visit was # 23   Starting weight: 241 lbs Starting date: 09/28/16 Today's weight : 230 lbs Today's date: 12/20/2017 Total lbs lost to date: 11    ASK: We discussed the diagnosis of obesity with Anne Ng today and Eilyn agreed to give [place] permission to discuss obesity behavioral modification therapy today.  ASSESS: Evonna has the diagnosis of obesity and her BMI today is 34.98 Alexismarie is in the action stage of change   ADVISE: Sanaa was educated on the multiple health risks of obesity as well as the benefit of weight loss to improve her health. She was advised of the need for long term treatment and the importance of lifestyle modifications to improve her current health and to decrease her risk of future health problems.  AGREE: Multiple dietary modification options and treatment options were discussed and  Kamaiyah agreed to follow  the recommendations documented in the above note.  ARRANGE: Cyerra was educated on the importance of frequent visits to treat obesity as outlined per CMS and USPSTF guidelines and agreed to schedule her next follow up appointment today.  I, Trixie Dredge, am acting as transcriptionist for Dennard Nip, MD  I have reviewed the above documentation for accuracy and completeness, and I agree with the above. -Dennard Nip, MD

## 2017-12-26 DIAGNOSIS — M62838 Other muscle spasm: Secondary | ICD-10-CM | POA: Diagnosis not present

## 2017-12-26 DIAGNOSIS — M79621 Pain in right upper arm: Secondary | ICD-10-CM | POA: Diagnosis not present

## 2017-12-28 DIAGNOSIS — M79621 Pain in right upper arm: Secondary | ICD-10-CM | POA: Diagnosis not present

## 2017-12-28 DIAGNOSIS — M62838 Other muscle spasm: Secondary | ICD-10-CM | POA: Diagnosis not present

## 2018-01-02 DIAGNOSIS — M62838 Other muscle spasm: Secondary | ICD-10-CM | POA: Diagnosis not present

## 2018-01-02 DIAGNOSIS — M79621 Pain in right upper arm: Secondary | ICD-10-CM | POA: Diagnosis not present

## 2018-01-04 DIAGNOSIS — M79621 Pain in right upper arm: Secondary | ICD-10-CM | POA: Diagnosis not present

## 2018-01-04 DIAGNOSIS — M62838 Other muscle spasm: Secondary | ICD-10-CM | POA: Diagnosis not present

## 2018-01-09 ENCOUNTER — Ambulatory Visit (INDEPENDENT_AMBULATORY_CARE_PROVIDER_SITE_OTHER): Payer: PPO | Admitting: Family Medicine

## 2018-01-09 VITALS — BP 127/77 | HR 67 | Temp 98.5°F | Ht 68.0 in | Wt 230.0 lb

## 2018-01-09 DIAGNOSIS — E7849 Other hyperlipidemia: Secondary | ICD-10-CM

## 2018-01-09 DIAGNOSIS — F3289 Other specified depressive episodes: Secondary | ICD-10-CM

## 2018-01-09 DIAGNOSIS — M79621 Pain in right upper arm: Secondary | ICD-10-CM | POA: Diagnosis not present

## 2018-01-09 DIAGNOSIS — M62838 Other muscle spasm: Secondary | ICD-10-CM | POA: Diagnosis not present

## 2018-01-09 DIAGNOSIS — E538 Deficiency of other specified B group vitamins: Secondary | ICD-10-CM | POA: Diagnosis not present

## 2018-01-09 DIAGNOSIS — E66812 Obesity, class 2: Secondary | ICD-10-CM

## 2018-01-09 DIAGNOSIS — Z6835 Body mass index (BMI) 35.0-35.9, adult: Secondary | ICD-10-CM

## 2018-01-09 DIAGNOSIS — R7303 Prediabetes: Secondary | ICD-10-CM

## 2018-01-09 DIAGNOSIS — E559 Vitamin D deficiency, unspecified: Secondary | ICD-10-CM

## 2018-01-09 MED ORDER — BUPROPION HCL ER (SR) 200 MG PO TB12: 200.0000 mg | ORAL_TABLET | Freq: Two times a day (BID) | ORAL | 0 refills | Status: DC

## 2018-01-09 MED ORDER — VITAMIN D (ERGOCALCIFEROL) 1.25 MG (50000 UNIT) PO CAPS: 50000.0000 [IU] | ORAL_CAPSULE | ORAL | 0 refills | Status: DC

## 2018-01-10 LAB — T3: T3, Total: 121 ng/dL (ref 71–180)

## 2018-01-10 LAB — CBC WITH DIFFERENTIAL
Basophils Absolute: 0.1 10*3/uL (ref 0.0–0.2)
Basos: 1 %
EOS (ABSOLUTE): 0.2 10*3/uL (ref 0.0–0.4)
EOS: 2 %
HEMATOCRIT: 39.5 % (ref 34.0–46.6)
HEMOGLOBIN: 13.1 g/dL (ref 11.1–15.9)
IMMATURE GRANULOCYTES: 1 %
Immature Grans (Abs): 0.1 10*3/uL (ref 0.0–0.1)
LYMPHS ABS: 2.4 10*3/uL (ref 0.7–3.1)
Lymphs: 31 %
MCH: 27.9 pg (ref 26.6–33.0)
MCHC: 33.2 g/dL (ref 31.5–35.7)
MCV: 84 fL (ref 79–97)
Monocytes Absolute: 0.5 10*3/uL (ref 0.1–0.9)
Monocytes: 7 %
Neutrophils Absolute: 4.5 10*3/uL (ref 1.4–7.0)
Neutrophils: 58 %
RBC: 4.69 x10E6/uL (ref 3.77–5.28)
RDW: 14.7 % (ref 12.3–15.4)
WBC: 7.6 10*3/uL (ref 3.4–10.8)

## 2018-01-10 LAB — TSH: TSH: 1.84 u[IU]/mL (ref 0.450–4.500)

## 2018-01-10 LAB — COMPREHENSIVE METABOLIC PANEL
A/G RATIO: 1.8 (ref 1.2–2.2)
ALBUMIN: 4.4 g/dL (ref 3.5–5.5)
ALT: 21 IU/L (ref 0–32)
AST: 18 IU/L (ref 0–40)
Alkaline Phosphatase: 88 IU/L (ref 39–117)
BUN / CREAT RATIO: 32 — AB (ref 9–23)
BUN: 20 mg/dL (ref 6–24)
Bilirubin Total: 0.3 mg/dL (ref 0.0–1.2)
CALCIUM: 9.4 mg/dL (ref 8.7–10.2)
CO2: 22 mmol/L (ref 20–29)
CREATININE: 0.62 mg/dL (ref 0.57–1.00)
Chloride: 104 mmol/L (ref 96–106)
GFR calc Af Amer: 123 mL/min/{1.73_m2} (ref >59)
GFR calc non Af Amer: 107 mL/min/{1.73_m2} (ref >59)
GLOBULIN, TOTAL: 2.5 g/dL (ref 1.5–4.5)
Glucose: 74 mg/dL (ref 65–99)
Potassium: 4.5 mmol/L (ref 3.5–5.2)
SODIUM: 142 mmol/L (ref 134–144)
Total Protein: 6.9 g/dL (ref 6.0–8.5)

## 2018-01-10 LAB — LIPID PANEL WITH LDL/HDL RATIO
Cholesterol, Total: 157 mg/dL (ref 100–199)
HDL: 58 mg/dL (ref >39)
LDL Calculated: 82 mg/dL (ref 0–99)
LDl/HDL Ratio: 1.4 ratio (ref 0.0–3.2)
TRIGLYCERIDES: 84 mg/dL (ref 0–149)
VLDL CHOLESTEROL CAL: 17 mg/dL (ref 5–40)

## 2018-01-10 LAB — INSULIN, RANDOM: INSULIN: 15.3 u[IU]/mL (ref 2.6–24.9)

## 2018-01-10 LAB — VITAMIN B12: VITAMIN B 12: 593 pg/mL (ref 232–1245)

## 2018-01-10 LAB — T4, FREE: Free T4: 0.96 ng/dL (ref 0.82–1.77)

## 2018-01-10 LAB — HEMOGLOBIN A1C
Est. average glucose Bld gHb Est-mCnc: 108 mg/dL
Hgb A1c MFr Bld: 5.4 % (ref 4.8–5.6)

## 2018-01-10 LAB — VITAMIN D 25 HYDROXY (VIT D DEFICIENCY, FRACTURES): Vit D, 25-Hydroxy: 39.1 ng/mL (ref 30.0–100.0)

## 2018-01-11 ENCOUNTER — Encounter (INDEPENDENT_AMBULATORY_CARE_PROVIDER_SITE_OTHER): Payer: Self-pay | Admitting: Family Medicine

## 2018-01-11 NOTE — Progress Notes (Signed)
Office: [phone]  /  Fax: [phone]   HPI:   Chief Complaint: OBESITY [name] is here to discuss her progress with her obesity treatment plan. She is on the keep a food journal with 1600 calories and 90+ grams of protein daily and is following her eating plan approximately 50 % of the time. She states she is walking for 30 minutes 6 times per week. [name] has done well maintaining weight. She is struggling to journal on a regular basis but states she thinks this will be easier for the next few weeks. Her hunger is controlled.  Her weight is 230 lb (104.3 kg) today and has not lost weight since her last visit. She has lost 11 lbs since starting treatment with [place].  Pre-Diabetes [name] has a diagnosis of pre-diabetes based on her elevated Hgb A1c and was informed this puts her at greater risk of developing diabetes. She is working on improving with diet, metformin, and Victoza. She denies nausea, vomiting, or hypoglycemia.  Vitamin D Deficiency [name] has a diagnosis of vitamin D deficiency. She is stable on prescription Vit D and denies nausea, vomiting or muscle weakness.  Vitamin B12 Deficiency [name] has a diagnosis of B12 insufficiency and notes fatigue. She is on metformin and is at higher risk of B12 deficiency. She is not a vegetarian and does not have a previous diagnosis of pernicious anemia. She does not have a history of weight loss surgery.   Hyperlipidemia [name] has hyperlipidemia and has been attempting to improve her cholesterol levels with intensive lifestyle modification including a low saturated fat diet, exercise and weight loss. She is not on statin and denies any chest pain, claudication or myalgias.  Depression with emotional eating behaviors [name]'s mood is stable on Wellbutrin but she is still struggling with emotional eating and planning ahead. [name] struggles with emotional eating and using food for comfort to the extent that it is  negatively impacting her health. She often snacks when she is not hungry. [name] sometimes feels she is out of control and then feels guilty that she made poor food choices. She has been working on behavior modification techniques to help reduce her emotional eating and has been somewhat successful. She shows no sign of suicidal or homicidal ideations.  Depression screen [name] 2/9 09/28/2016 07/29/2015 09/22/2014  Decreased Interest 2 0 0  Down, Depressed, Hopeless 1 0 0  PHQ - 2 Score 3 0 0  Altered sleeping 1 - -  Tired, decreased energy 2 - -  Change in appetite 1 - -  Feeling bad or failure about yourself  1 - -  Trouble concentrating 2 - -  Moving slowly or fidgety/restless 1 - -  Suicidal thoughts 0 - -  PHQ-9 Score 11 - -  Some recent data might be hidden    ALLERGIES: Allergies  Allergen Reactions  . Chloraprep One Step [Chlorhexidine Gluconate] Rash    Developed severe rash where chloraprep was used on chest area  . Ivp Dye [Iodinated Diagnostic Agents] Rash and Other (See Comments)    Flushing, minor facial rash & dyspnea  . Nalbuphine Shortness Of Breath and Rash    Nubain caused respiratory distress & rash  . Septra [Sulfamethoxazole-Trimethoprim] Shortness Of Breath and Rash  . Sulfamethoxazole-Trimethoprim Rash    rash  . Tylox [Oxycodone-Acetaminophen] Rash    MEDICATIONS: Current Outpatient Medications on File Prior to Visit  Medication Sig Dispense Refill  . aspirin 81 MG tablet Take 81 mg by mouth daily.     [name]  citalopram (CELEXA) 20 MG tablet Take 1 tablet (20 mg total) by mouth daily. 90 tablet 1  . clobetasol cream (TEMOVATE) 1.61 % Apply 1 application topically 2 (two) times daily. 30 g 0  . Fluticasone-Salmeterol (ADVAIR DISKUS) 100-50 MCG/DOSE AEPB Inhale 1 puff into the lungs 2 (two) times daily. 60 each 5  . hydrochlorothiazide (MICROZIDE) 12.5 MG capsule TAKE 1 CAPSULE BY MOUTH EVERY DAY 90 capsule 2  . Insulin Pen Needle (BD PEN NEEDLE NANO 2ND GEN) 32G  X 4 MM MISC 1 Package by Does not apply route 2 (two) times daily. 100 each 0  . liraglutide (VICTOZA) 18 MG/3ML SOPN Inject 0.1 mLs (0.6 mg total) into the skin every morning. 1 pen 0  . lisinopril (PRINIVIL,ZESTRIL) 40 MG tablet TAKE 1 TABLET BY MOUTH EVERY DAY 90 tablet 1  . metFORMIN (GLUCOPHAGE) 500 MG tablet Take 1 tablet (500 mg total) by mouth 2 (two) times daily. 60 tablet 0  . montelukast (SINGULAIR) 10 MG tablet TAKE 1 TABLET BY MOUTH EVERYDAY AT BEDTIME 90 tablet 0  . polyethylene glycol powder (GLYCOLAX/MIRALAX) powder Take 17 g by mouth daily. 3350 g 0  . ranitidine (ZANTAC) 150 MG tablet Take 1 tablet (150 mg total) by mouth at bedtime. 90 tablet 3  . topiramate (TOPAMAX) 50 MG tablet Take 1 tablet (50 mg total) by mouth daily. 30 tablet 0   No current facility-administered medications on file prior to visit.     PAST MEDICAL HISTORY: Past Medical History:  Diagnosis Date  . Anemia   . Anxiety   . Arthritis    inflammitory  . Asthma   . B12 deficiency   . Back pain   . Chronic joint pain   . Constipation   . DDD (degenerative disc disease), lumbar    low back and neck and shoulders  . Depression    during divorce & legal matters  . Fatty liver   . Fibromyalgia   . Gallbladder problem   . GERD (gastroesophageal reflux disease)   . History of kidney stones    Dr Phebe Colla  . History of polycystic ovarian disease S/P BSO  . Hx of anxiety disorder   . Hypertension    a  . Hypothyroidism    no meds  . IBS (irritable bowel syndrome)   . IBS (irritable bowel syndrome)   . Infertility, female   . Joint pain   . Kidney problem   . Lactose intolerance   . Lupus (HCC) hx positive ANA   followed by Dr Gavin Pound; possible lupus  . Multiple food allergies   . Myalgia   . Osteoarthritis   . 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 POLYCYSTIC OVARIAN DISEASE 03/02/2007   Qualifier: Diagnosis of  By: Linna Darner MD, Rae Mar BSO for cysts & endometriosis; Dr  Reino Kent, Gyn Seeing Dr Paula Compton    . PONV (postoperative nausea and vomiting)   . Prediabetes   . Sweating profusely   . Symptomatic mammary hypertrophy   . URI (upper respiratory infection)    currently on cefdinir    PAST SURGICAL HISTORY: Past Surgical History:  Procedure Laterality Date  . [place]   exploratory lap  . BREAST REDUCTION SURGERY Bilateral 03/26/2015   Procedure: BILATERAL BREAST REDUCTION ;  Surgeon: Wallace Going, DO;  Location: [facility];  Service: Plastics;  Laterality: Bilateral;  . CARPAL TUNNEL RELEASE  bilateral; Dr Sherwood Gambler  . COLONOSCOPY     in 1990s  . CYSTO/ RIGHT RETROGRADE URETERAL PYELOGRAM  07-09-2004   HX BILATERAL RENAL STONES/ RIGHT FLANK PAIN  . CYSTOSCOPY W/ URETERAL STENT PLACEMENT  11/12/2011   Procedure: CYSTOSCOPY WITH RETROGRADE PYELOGRAM/URETERAL STENT PLACEMENT;  Surgeon: Alexis Frock, MD;  Location: [facility];  Service: Urology;  Laterality: Right;  . CYSTOSCOPY W/ URETERAL STENT REMOVAL  12/07/2011   Procedure: CYSTOSCOPY WITH STENT REMOVAL;  Surgeon: Alexis Frock, MD;  Location: [facility];  Service: Urology;  Laterality: Right;  . CYSTOSCOPY/RETROGRADE/URETEROSCOPY/STONE EXTRACTION WITH BASKET  12/07/2011   Procedure: CYSTOSCOPY/RETROGRADE/URETEROSCOPY/STONE EXTRACTION WITH BASKET;  Surgeon: Alexis Frock, MD;  Location: [facility];  Service: Urology;  Laterality: Right;  . DILATION AND CURETTAGE OF UTERUS     X5  . EXCISION LEFT BARTHOLIN GLAND  02-05-2002  . EYE SURGERY     Retinal tears surgery bilateral  . LAPAROSCOPIC ASSISTED VAGINAL HYSTERECTOMY  2000  . LAPAROSCOPIC CHOLECYSTECTOMY  05-31-2007  . LAPAROSCOPIC LASER ABLATION ENDOMETRIOSIS AND LEFT SALPINGO-OOPHORECTOMY  11-03-1999  . LAPAROSCOPY WITH RIGHT SALPINGO-OOPHECTOMY/ LYSIS ADHESIONS AND ABLATION ENDOMETRIOSIS  05-17-2001  . LIPOSUCTION Bilateral 03/26/2015   Procedure:  WITH LIPOSUCTION;  Surgeon: Wallace Going, DO;  Location: [facility];  Service: Plastics;  Laterality: Bilateral;  . LUMBAR LAMINECTOMY  2003   L4 - L5  . PLANTAR FASCIA SURGERY     right  . PLANTAR FASCIA SURGERY  02/2011   left  . RIGHT URETEROSCOPIC STONE EXTRACTION  08-04-2000  . TONSILLECTOMY    . URETERAL STENT PLACEMENT  11/12/11   Dr Tresa Moore    SOCIAL HISTORY: Social History   Tobacco Use  . Smoking status: Never Smoker  . Smokeless tobacco: Never Used  Substance Use Topics  . Alcohol use: Yes    Alcohol/week: 1.0 standard drinks    Types: 1 Shots of liquor per week    Comment:  very rarely  . Drug use: No    FAMILY HISTORY: Family History  Problem Relation Age of Onset  . Hypertension Mother   . Colon polyps Mother   . Atrial fibrillation Mother        Dr Angelena Form  . Hyperlipidemia Mother   . Heart disease Mother   . Anxiety disorder Mother   . Sleep apnea Mother   . Obesity Mother   . Heart attack Father 44  . Diabetes Father   . Hypertension Father   . Hyperlipidemia Father   . Heart disease Father   . Stroke Father   . Obesity Father   . Hypertension Sister   . Heart attack Paternal Grandmother 18  . Breast cancer Paternal Aunt   . Colon cancer Maternal Uncle   . Colon cancer Paternal Uncle   . Colon polyps Maternal Grandmother   . Stroke Maternal Grandmother 76  . Diabetes Paternal Grandfather   . Stroke Maternal Grandfather 37    ROS: Review of Systems  Constitutional: Positive for malaise/fatigue. Negative for weight loss.  Cardiovascular: Negative for chest pain and claudication.  Gastrointestinal: Negative for nausea and vomiting.  Musculoskeletal: Negative for myalgias.       Negative muscle weakness  Endo/Heme/Allergies:       Negative hypoglycemia  Psychiatric/Behavioral: Positive for depression. Negative for suicidal ideas.    PHYSICAL EXAM: Blood pressure 127/77, pulse 67, temperature 98.5 F (36.9 C),  temperature source Oral, height 5\' 8"  (1.727 m), weight 230 lb (104.3 kg), SpO2 99 %.  Body mass index is 34.97 kg/m. Physical Exam  Constitutional: She is oriented to person, place, and time. [name].  Cardiovascular: Normal rate.  Pulmonary/Chest: Effort normal.  Musculoskeletal: Normal range of motion.  Neurological: She is oriented to person, place, and time.  Skin: Skin is warm and dry.  Psychiatric: She has a normal mood and affect. Her behavior is normal.  Vitals reviewed.   RECENT LABS AND TESTS: BMET    Component Value Date/Time   NA 142 01/09/2018 1338   K 4.5 01/09/2018 1338   CL 104 01/09/2018 1338   CO2 22 01/09/2018 1338   GLUCOSE 74 01/09/2018 1338   GLUCOSE 92 08/16/2016 1011   BUN 20 01/09/2018 1338   CREATININE 0.62 01/09/2018 1338   CALCIUM 9.4 01/09/2018 1338   GFRNONAA 107 01/09/2018 1338   GFRAA 123 01/09/2018 1338   Lab Results  Component Value Date   HGBA1C 5.4 01/09/2018   HGBA1C 5.8 (H) 07/04/2017   HGBA1C 5.6 02/06/2017   HGBA1C 5.6 09/28/2016   HGBA1C 5.9 08/16/2016   Lab Results  Component Value Date   INSULIN 15.3 01/09/2018   INSULIN 10.4 07/04/2017   INSULIN 28.1 (H) 02/06/2017   INSULIN 14.7 09/28/2016   CBC    Component Value Date/Time   WBC 7.6 01/09/2018 1338   WBC 6.6 08/16/2016 1011   RBC 4.69 01/09/2018 1338   RBC 4.68 08/16/2016 1011   HGB 13.1 01/09/2018 1338   HCT 39.5 01/09/2018 1338   PLT 235.0 08/16/2016 1011   MCV 84 01/09/2018 1338   MCH 27.9 01/09/2018 1338   MCH 27.0 04/19/2015 0650   MCHC 33.2 01/09/2018 1338   MCHC 33.4 08/16/2016 1011   RDW 14.7 01/09/2018 1338   LYMPHSABS 2.4 01/09/2018 1338   MONOABS 0.6 08/16/2016 1011   EOSABS 0.2 01/09/2018 1338   BASOSABS 0.1 01/09/2018 1338   Iron/TIBC/Ferritin/ %Sat No results found for: IRON, TIBC, FERRITIN, IRONPCTSAT Lipid Panel     Component Value Date/Time   CHOL 157 01/09/2018 1338   TRIG 84 01/09/2018 1338    HDL 58 01/09/2018 1338   CHOLHDL 3 08/16/2016 1011   VLDL 12.2 08/16/2016 1011   LDLCALC 82 01/09/2018 1338   Hepatic Function Panel     Component Value Date/Time   PROT 6.9 01/09/2018 1338   ALBUMIN 4.4 01/09/2018 1338   AST 18 01/09/2018 1338   ALT 21 01/09/2018 1338   ALKPHOS 88 01/09/2018 1338   BILITOT 0.3 01/09/2018 1338   BILIDIR 0.1 06/12/2013 1632   IBILI 0.2 06/11/2009 2309      Component Value Date/Time   TSH 1.840 01/09/2018 1338   TSH 2.100 07/04/2017 1216   TSH 1.54 08/16/2016 1011  Results for STUMP Kitson, REY FORS (MRN [id number]) as of 01/11/2018 12:09  Ref. Range 01/09/2018 13:38  Vitamin D, 25-Hydroxy Latest Ref Range: 30.0 - 100.0 ng/mL 39.1    ASSESSMENT AND PLAN: Prediabetes - Plan: CBC With Differential, Comprehensive metabolic panel, Hemoglobin A1c, Insulin, random, T3, T4, free, TSH  Vitamin D deficiency - Plan: Vitamin D, Ergocalciferol, (DRISDOL) 50000 units CAPS capsule  B12 nutritional deficiency - Plan: Vitamin B12, VITAMIN D 25 Hydroxy (Vit-D Deficiency, Fractures)  Other hyperlipidemia - Plan: Lipid Panel With LDL/HDL Ratio  Other depression - With emotional eating - Plan: buPROPion (WELLBUTRIN SR) 200 MG 12 hr tablet  Class 2 severe obesity with serious comorbidity and body mass index (BMI) of 35.0 to 35.9 in adult, unspecified obesity type ([place])  PLAN:  Pre-Diabetes [name] will continue to work on weight loss, diet, exercise, and decreasing simple carbohydrates in her diet to help decrease the risk of diabetes. We dicussed metformin including benefits and risks. She was informed that eating too many simple carbohydrates or too many calories at one sitting increases the likelihood of GI side effects. [name] agrees to continue taking her medications and we will check labs today. [name] agrees to follow up with our clinic in 3 weeks as directed to monitor her progress.  Vitamin D Deficiency [name] was informed that low vitamin D  levels contributes to fatigue and are associated with obesity, breast, and colon cancer. [name] agrees to continue taking prescription Vit D @50 ,000 IU every week #4 and we will refill for 1 month. She will follow up for routine testing of vitamin D, at least 2-3 times per year. She was informed of the risk of over-replacement of vitamin D and agrees to not increase her dose unless she discusses this with [place] first. [name] agrees to follow up with our clinic in 3 weeks.  Vitamin B12 Deficiency [name] will work on increasing B12 rich foods in her diet. B12 supplementation was not prescribed today. We will check labs and [name] agrees to follow up with our clinic in 3 weeks.  Hyperlipidemia [name] was informed of the American Heart Association Guidelines emphasizing intensive lifestyle modifications as the first line treatment for hyperlipidemia. We discussed many lifestyle modifications today in depth, and [name] will continue to work on decreasing saturated fats such as fatty red meat, butter and many fried foods. She will also increase vegetables and lean protein in her diet and continue to work on diet, exercise, and weight loss efforts. We will check labs and [name] agrees to follow up with our clinic in 3 weeks.  Depression with Emotional Eating Behaviors We discussed behavior modification techniques and cognitive behavioral therapy today to help [name] deal with her emotional eating and depression. [name] agrees to continue taking Wellbutrin SR 200 mg BID #60 and we will refill for 1 month. [name] agrees to follow up with our clinic in 3 weeks.  Obesity [name] is currently in the action stage of change. As such, her goal is to continue with weight loss efforts She has agreed to keep a food journal with 1400-1600 calories and 90 grams of protein daily [name] has been instructed to work up to a goal of 150 minutes of combined cardio and strengthening exercise per week for  weight loss and overall health benefits. We discussed the following Behavioral Modification Strategies today: increasing lean protein intake and no skipping meals   [name] has agreed to follow up with our clinic in 3 weeks. She was informed of the importance of frequent follow up visits to maximize her success with intensive lifestyle modifications for her multiple health conditions.   OBESITY BEHAVIORAL INTERVENTION VISIT  Today's visit was # 24   Starting weight: 241 lbs Starting date: 09/28/16 Today's weight : 230 lbs Today's date: 01/09/2018 Total lbs lost to date: 11 At least 15 minutes were spent on discussing the following behavioral intervention visit.   ASK: We discussed the diagnosis of obesity with [name] today and [name] agreed to give [place] permission to discuss obesity behavioral modification therapy today.  ASSESS: [name] has the diagnosis of obesity and her BMI today is 34.98 [name] is in the action stage of change   ADVISE: [name] was educated on the multiple health risks of obesity as well  as the benefit of weight loss to improve her health. She was advised of the need for long term treatment and the importance of lifestyle modifications to improve her current health and to decrease her risk of future health problems.  AGREE: Multiple dietary modification options and treatment options were discussed and  [name] agreed to follow the recommendations documented in the above note.  ARRANGE: Adrienne was educated on the importance of frequent visits to treat obesity as outlined per CMS and USPSTF guidelines and agreed to schedule her next follow up appointment today.  I, Trixie Dredge, am acting as transcriptionist for Dennard Nip, MD  I have reviewed the above documentation for accuracy and completeness, and I agree with the above. -Dennard Nip, MD

## 2018-01-12 ENCOUNTER — Other Ambulatory Visit (INDEPENDENT_AMBULATORY_CARE_PROVIDER_SITE_OTHER): Payer: Self-pay | Admitting: Family Medicine

## 2018-01-12 DIAGNOSIS — M79621 Pain in right upper arm: Secondary | ICD-10-CM | POA: Diagnosis not present

## 2018-01-12 DIAGNOSIS — R7303 Prediabetes: Secondary | ICD-10-CM

## 2018-01-12 DIAGNOSIS — M62838 Other muscle spasm: Secondary | ICD-10-CM | POA: Diagnosis not present

## 2018-01-19 ENCOUNTER — Other Ambulatory Visit (INDEPENDENT_AMBULATORY_CARE_PROVIDER_SITE_OTHER): Payer: Self-pay | Admitting: Family Medicine

## 2018-01-19 DIAGNOSIS — M62838 Other muscle spasm: Secondary | ICD-10-CM | POA: Diagnosis not present

## 2018-01-19 DIAGNOSIS — E559 Vitamin D deficiency, unspecified: Secondary | ICD-10-CM

## 2018-01-22 ENCOUNTER — Ambulatory Visit (INDEPENDENT_AMBULATORY_CARE_PROVIDER_SITE_OTHER): Payer: PPO | Admitting: Internal Medicine

## 2018-01-22 ENCOUNTER — Encounter: Payer: Self-pay | Admitting: Internal Medicine

## 2018-01-22 ENCOUNTER — Other Ambulatory Visit (INDEPENDENT_AMBULATORY_CARE_PROVIDER_SITE_OTHER): Payer: Self-pay | Admitting: Family Medicine

## 2018-01-22 VITALS — BP 132/78 | HR 85 | Temp 98.7°F | Resp 16 | Ht 68.0 in | Wt 235.0 lb

## 2018-01-22 DIAGNOSIS — J0100 Acute maxillary sinusitis, unspecified: Secondary | ICD-10-CM | POA: Diagnosis not present

## 2018-01-22 DIAGNOSIS — E559 Vitamin D deficiency, unspecified: Secondary | ICD-10-CM

## 2018-01-22 DIAGNOSIS — J45901 Unspecified asthma with (acute) exacerbation: Secondary | ICD-10-CM | POA: Diagnosis not present

## 2018-01-22 MED ORDER — CEFDINIR 300 MG PO CAPS: 300.0000 mg | ORAL_CAPSULE | Freq: Two times a day (BID) | ORAL | 0 refills | Status: DC

## 2018-01-22 MED ORDER — ALBUTEROL SULFATE HFA 108 (90 BASE) MCG/ACT IN AERS: 2.0000 | INHALATION_SPRAY | Freq: Four times a day (QID) | RESPIRATORY_TRACT | 0 refills | Status: DC | PRN

## 2018-01-22 NOTE — Progress Notes (Signed)
Subjective:    Patient ID: [name], female    DOB: Jun 19, 1970, 47 y.o.   MRN: [id number]  HPI She is here for an acute visit for cold symptoms.  Her symptoms started 2 weeks ago she is experiencing a primarily dry cough.  The cough has worsened over the past couple of weeks.  Occasionally she will bring up some sputum that is clear-white in color.  She has had some intermittent wheezing, but denies any shortness of breath.  She is taking her Advair twice a day, but does wish she could use it more often-it does help.  She states subjective fevers, nasal congestion, sinus pain, sore throat and headaches.   She has taken her inhaler and has been using it more often.    Medications and allergies reviewed with patient and updated if appropriate.  Patient Active Problem List   Diagnosis Date Noted  . Dry cough 11/21/2017  . Rash and nonspecific skin eruption 11/21/2017  . Anxiety 08/17/2017  . 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 Other insomnia 06/06/2017  . Depression 05/16/2017  . Umbilical hernia 40/98/1191  . Diverticulosis of colon 02/26/2017  . Hematuria 02/16/2017  . Abdominal pain 02/16/2017  . Other hyperlipidemia 02/06/2017  . Asthma exacerbation, mild 11/24/2016  . Vitamin D deficiency 11/09/2016  . Class 2 obesity with serious comorbidity and body mass index (BMI) of 35.0 to 35.9 in adult 11/09/2016  . Right shoulder pain 10/11/2016  . AC (acromioclavicular) joint arthritis 09/13/2016  . Postsurgical menopause 08/16/2016  . Mucoid cyst of joint 11/04/2015  . External hemorrhoid 08/20/2015  . Prediabetes 06/05/2015  . GERD (gastroesophageal reflux disease) 06/04/2015  . Status post bilateral breast reduction 04/01/2015  . Cervical disc disorder with radiculopathy of cervical region 05/23/2014  . Neck pain 02/03/2014  . Ulnar neuropathy 01/16/2014  . Fibromyalgia 01/16/2014  . Recurrent nephrolithiasis 04/29/2013  . Arthralgia 04/29/2013  . Panic attacks  01/15/2013  . IBS (irritable bowel syndrome) 12/14/2011  . B12 deficiency 06/30/2009  . Hypothyroidism 03/02/2007  . Essential hypertension 03/02/2007    Current Outpatient Medications on File Prior to Visit  Medication Sig Dispense Refill  . aspirin 81 MG tablet Take 81 mg by mouth daily.     [name] buPROPion (WELLBUTRIN SR) 200 MG 12 hr tablet Take 1 tablet (200 mg total) by mouth 2 (two) times daily. 60 tablet 0  . citalopram (CELEXA) 20 MG tablet Take 1 tablet (20 mg total) by mouth daily. 90 tablet 1  . clobetasol cream (TEMOVATE) 4.78 % Apply 1 application topically 2 (two) times daily. 30 g 0  . Fluticasone-Salmeterol (ADVAIR DISKUS) 100-50 MCG/DOSE AEPB Inhale 1 puff into the lungs 2 (two) times daily. 60 each 5  . hydrochlorothiazide (MICROZIDE) 12.5 MG capsule TAKE 1 CAPSULE BY MOUTH EVERY DAY 90 capsule 2  . Insulin Pen Needle (BD PEN NEEDLE NANO 2ND GEN) 32G X 4 MM MISC 1 Package by Does not apply route 2 (two) times daily. 100 each 0  . liraglutide (VICTOZA) 18 MG/3ML SOPN Inject 0.1 mLs (0.6 mg total) into the skin every morning. 1 pen 0  . lisinopril (PRINIVIL,ZESTRIL) 40 MG tablet TAKE 1 TABLET BY MOUTH EVERY DAY 90 tablet 1  . metFORMIN (GLUCOPHAGE) 500 MG tablet TAKE 1 TABLET BY MOUTH TWICE A DAY 60 tablet 0  . montelukast (SINGULAIR) 10 MG tablet TAKE 1 TABLET BY MOUTH EVERYDAY AT BEDTIME 90 tablet 0  . polyethylene glycol powder (GLYCOLAX/MIRALAX) powder Take 17 g by  mouth daily. 3350 g 0  . ranitidine (ZANTAC) 150 MG tablet Take 1 tablet (150 mg total) by mouth at bedtime. 90 tablet 3  . topiramate (TOPAMAX) 50 MG tablet Take 1 tablet (50 mg total) by mouth daily. 30 tablet 0  . Vitamin D, Ergocalciferol, (DRISDOL) 50000 units CAPS capsule Take 1 capsule (50,000 Units total) by mouth every 7 (seven) days. 4 capsule 0   No current facility-administered medications on file prior to visit.     Past Medical History:  Diagnosis Date  . Anemia   . Anxiety   . Arthritis     inflammitory  . Asthma   . B12 deficiency   . Back pain   . Chronic joint pain   . Constipation   . DDD (degenerative disc disease), lumbar    low back and neck and shoulders  . Depression    during divorce & legal matters  . Fatty liver   . Fibromyalgia   . Gallbladder problem   . GERD (gastroesophageal reflux disease)   . History of kidney stones    Dr Phebe Colla  . History of polycystic ovarian disease S/P BSO  . Hx of anxiety disorder   . Hypertension    a  . Hypothyroidism    no meds  . IBS (irritable bowel syndrome)   . IBS (irritable bowel syndrome)   . Infertility, female   . Joint pain   . Kidney problem   . Lactose intolerance   . Lupus (HCC) hx positive ANA   followed by Dr Gavin Pound; possible lupus  . Multiple food allergies   . Myalgia   . Osteoarthritis   . 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 POLYCYSTIC OVARIAN DISEASE 03/02/2007   Qualifier: Diagnosis of  By: Linna Darner MD, Rae Mar BSO for cysts & endometriosis; Dr Reino Kent, Gyn Seeing Dr Paula Compton    . PONV (postoperative nausea and vomiting)   . Prediabetes   . Sweating profusely   . Symptomatic mammary hypertrophy   . URI (upper respiratory infection)    currently on cefdinir    Past Surgical History:  Procedure Laterality Date  . [place]   exploratory lap  . BREAST REDUCTION SURGERY Bilateral 03/26/2015   Procedure: BILATERAL BREAST REDUCTION ;  Surgeon: Wallace Going, DO;  Location: [facility];  Service: Plastics;  Laterality: Bilateral;  . CARPAL TUNNEL RELEASE     bilateral; Dr Sherwood Gambler  . COLONOSCOPY     in 1990s  . CYSTO/ RIGHT RETROGRADE URETERAL PYELOGRAM  07-09-2004   HX BILATERAL RENAL STONES/ RIGHT FLANK PAIN  . CYSTOSCOPY W/ URETERAL STENT PLACEMENT  11/12/2011   Procedure: CYSTOSCOPY WITH RETROGRADE PYELOGRAM/URETERAL STENT PLACEMENT;  Surgeon: Alexis Frock, MD;  Location: [facility];  Service: Urology;   Laterality: Right;  . CYSTOSCOPY W/ URETERAL STENT REMOVAL  12/07/2011   Procedure: CYSTOSCOPY WITH STENT REMOVAL;  Surgeon: Alexis Frock, MD;  Location: [facility]  Service: Urology;  Laterality: Right;  . CYSTOSCOPY/RETROGRADE/URETEROSCOPY/STONE EXTRACTION WITH BASKET  12/07/2011   Procedure: CYSTOSCOPY/RETROGRADE/URETEROSCOPY/STONE EXTRACTION WITH BASKET;  Surgeon: Alexis Frock, MD;  Location: [facility];  Service: Urology;  Laterality: Right;  . DILATION AND CURETTAGE OF UTERUS     X5  . EXCISION LEFT BARTHOLIN GLAND  02-05-2002  . EYE SURGERY     Retinal tears surgery bilateral  . LAPAROSCOPIC ASSISTED VAGINAL HYSTERECTOMY  2000  . LAPAROSCOPIC  CHOLECYSTECTOMY  05-31-2007  . LAPAROSCOPIC LASER ABLATION ENDOMETRIOSIS AND LEFT SALPINGO-OOPHORECTOMY  11-03-1999  . LAPAROSCOPY WITH RIGHT SALPINGO-OOPHECTOMY/ LYSIS ADHESIONS AND ABLATION ENDOMETRIOSIS  05-17-2001  . LIPOSUCTION Bilateral 03/26/2015   Procedure: WITH LIPOSUCTION;  Surgeon: Wallace Going, DO;  Location: [place];  Service: Plastics;  Laterality: Bilateral;  . LUMBAR LAMINECTOMY  2003   L4 - L5  . PLANTAR FASCIA SURGERY     right  . PLANTAR FASCIA SURGERY  02/2011   left  . RIGHT URETEROSCOPIC STONE EXTRACTION  08-04-2000  . TONSILLECTOMY    . URETERAL STENT PLACEMENT  11/12/11   Dr Tresa Moore    Social History   Socioeconomic History  . Marital status: Married    Spouse name: Darnelle Maffucci  . Number of children: 0  . Years of education: college  . Highest education level: Not on file  Occupational History  . Occupation: disabled  Social Needs  . Financial resource strain: Not on file  . Food insecurity:    Worry: Not on file    Inability: Not on file  . Transportation needs:    Medical: Not on file    Non-medical: Not on file  Tobacco Use  . Smoking status: Never Smoker  . Smokeless tobacco: Never Used  Substance and Sexual Activity  . Alcohol use: Yes     Alcohol/week: 1.0 standard drinks    Types: 1 Shots of liquor per week    Comment:  very rarely  . Drug use: No  . Sexual activity: Yes    Birth control/protection: Surgical  Lifestyle  . Physical activity:    Days per week: Not on file    Minutes per session: Not on file  . Stress: Not on file  Relationships  . Social connections:    Talks on phone: Not on file    Gets together: Not on file    Attends religious service: Not on file    Active member of club or organization: Not on file    Attends meetings of clubs or organizations: Not on file    Relationship status: Not on file  Other Topics Concern  . Not on file  Social History Narrative   Patient Lives at home with her husband Darnelle Maffucci)   Disabled.   Education two years of college.   Right handed.   Caffeine coffee and sweet tea. Not daily.    Family History  Problem Relation Age of Onset  . Hypertension Mother   . Colon polyps Mother   . Atrial fibrillation Mother        Dr Angelena Form  . Hyperlipidemia Mother   . Heart disease Mother   . Anxiety disorder Mother   . Sleep apnea Mother   . Obesity Mother   . Heart attack Father 64  . Diabetes Father   . Hypertension Father   . Hyperlipidemia Father   . Heart disease Father   . Stroke Father   . Obesity Father   . Hypertension Sister   . Heart attack Paternal Grandmother 23  . Breast cancer Paternal Aunt   . Colon cancer Maternal Uncle   . Colon cancer Paternal Uncle   . Colon polyps Maternal Grandmother   . Stroke Maternal Grandmother 76  . Diabetes Paternal Grandfather   . Stroke Maternal Grandfather 37    Review of Systems  Constitutional: Positive for fever (low grade). Negative for appetite change and chills.       Sweats and feels clammy intermittently  HENT: Positive for congestion, sinus pain and sore throat (resolved). Negative for ear pain.        Teeth pain  Respiratory: Positive for cough and wheezing (using inhaler). Negative for shortness of  breath.   Gastrointestinal: Negative for nausea.  Neurological: Positive for headaches.       Objective:   Vitals:   01/22/18 1431  BP: 132/78  Pulse: 85  Resp: 16  Temp: 98.7 F (37.1 C)  SpO2: 98%   Filed Weights   01/22/18 1431  Weight: 235 lb (106.6 kg)   Body mass index is 35.73 kg/m.  Wt Readings from Last 3 Encounters:  01/22/18 235 lb (106.6 kg)  01/09/18 230 lb (104.3 kg)  12/20/17 230 lb (104.3 kg)     Physical Exam GENERAL APPEARANCE: Appears stated age, well appearing, NAD EYES: conjunctiva clear, no icterus HEENT: bilateral tympanic membranes and ear canals normal, oropharynx with mild erythema, maxillary sinus tenderness bilaterally, no thyromegaly, trachea midline, no cervical or supraclavicular lymphadenopathy LUNGS: Clear to auscultation without wheeze or crackles, unlabored breathing, good air entry bilaterally CARDIOVASCULAR: Normal S1,S2 without murmurs, no edema SKIN: warm, dry        Assessment & Plan:   See Problem List for Assessment and Plan of chronic medical problems.

## 2018-01-22 NOTE — Patient Instructions (Addendum)
Take the antibiotic as prescribed.  Use the rescue inhaler as needed.  Continue the advair as needed.   Continue your allergy medication.   Take over the counter cold medications as needed    Rest and fluids  Call if no improvement

## 2018-01-22 NOTE — Assessment & Plan Note (Signed)
Likely bacterial  Start Omnicef twice daily x10 days otc cold medications Rest, fluid Call if no improvement

## 2018-01-22 NOTE — Assessment & Plan Note (Signed)
She has a mild asthma exacerbation secondary to sinus infection Lungs here today are clear Continue Advair twice daily Omnicef twice daily x10 days for sinus infection Albuterol inhaler as needed Call if no improvement

## 2018-01-25 ENCOUNTER — Ambulatory Visit: Payer: PPO | Admitting: Internal Medicine

## 2018-01-31 ENCOUNTER — Ambulatory Visit (INDEPENDENT_AMBULATORY_CARE_PROVIDER_SITE_OTHER): Payer: PPO | Admitting: Family Medicine

## 2018-01-31 VITALS — BP 118/75 | HR 71 | Temp 98.4°F | Ht 68.0 in | Wt 231.0 lb

## 2018-01-31 DIAGNOSIS — R7303 Prediabetes: Secondary | ICD-10-CM

## 2018-01-31 DIAGNOSIS — E66812 Obesity, class 2: Secondary | ICD-10-CM

## 2018-01-31 DIAGNOSIS — E559 Vitamin D deficiency, unspecified: Secondary | ICD-10-CM | POA: Diagnosis not present

## 2018-01-31 DIAGNOSIS — Z6835 Body mass index (BMI) 35.0-35.9, adult: Secondary | ICD-10-CM

## 2018-01-31 DIAGNOSIS — F418 Other specified anxiety disorders: Secondary | ICD-10-CM

## 2018-01-31 MED ORDER — VITAMIN D (ERGOCALCIFEROL) 1.25 MG (50000 UNIT) PO CAPS: 50000.0000 [IU] | ORAL_CAPSULE | ORAL | 0 refills | Status: DC

## 2018-01-31 MED ORDER — LIRAGLUTIDE 18 MG/3ML ~~LOC~~ SOPN: 1.2000 mg | PEN_INJECTOR | SUBCUTANEOUS | 0 refills | Status: DC

## 2018-01-31 MED ORDER — TOPIRAMATE 50 MG PO TABS: 50.0000 mg | ORAL_TABLET | Freq: Every day | ORAL | 0 refills | Status: DC

## 2018-01-31 NOTE — Progress Notes (Signed)
Office: [phone]  /  Fax: [phone]   HPI:   Chief Complaint: OBESITY [name] is here to discuss her progress with her obesity treatment plan. She is on the  keep a food journal with 1400-1600 calories and 90 protein  and is following her eating plan approximately 80 % of the time. She states she is exercising 67 minutes 4 times per week. [name] is retaining fluid today, has journaled most days and is exercising but has added the extra calories she thought she was burning back into her daily calorie goal. She is frustrated she didn't loose more weight.   Her weight is 231 lb (104.8 kg) today and gained 1 since her last visit. She has lost 11 lbs since starting treatment with [place].  Pre-Diabetes [name] has a diagnosis of prediabetes based on her elevated HgA1c and was informed this puts her at greater risk of developing diabetes. She is taking metformin currently and continues to work on diet and exercise to decrease risk of diabetes. She denies nausea or hypoglycemia.  Vitamin D deficiency [name] has a diagnosis of vitamin D deficiency. She is currently taking vit D and states she hasn't missed any doses, denies nausea, vomiting or muscle weakness.  Depression with emotional eating behaviors [name] is struggling with emotional eating and using food for comfort to the extent that it is negatively impacting her health. She often snacks when she is not hungry. [name] sometimes feels she is out of control and then feels guilty that she made poor food choices. She has been working on behavior modification techniques to help reduce her emotional eating and has been very successful. She shows no sign of suicidal or homicidal ideations.  Depression screen [facility] 2/9 09/28/2016 07/29/2015 09/22/2014  Decreased Interest 2 0 0  Down, Depressed, Hopeless 1 0 0  PHQ - 2 Score 3 0 0  Altered sleeping 1 - -  Tired, decreased energy 2 - -  Change in appetite 1 - -  Feeling bad or failure  about yourself  1 - -  Trouble concentrating 2 - -  Moving slowly or fidgety/restless 1 - -  Suicidal thoughts 0 - -  PHQ-9 Score 11 - -  Some recent data might be hidden      ALLERGIES: Allergies  Allergen Reactions  . Chloraprep One Step [Chlorhexidine Gluconate] Rash    Developed severe rash where chloraprep was used on chest area  . Ivp Dye [Iodinated Diagnostic Agents] Rash and Other (See Comments)    Flushing, minor facial rash & dyspnea  . Nalbuphine Shortness Of Breath and Rash    Nubain caused respiratory distress & rash  . Septra [Sulfamethoxazole-Trimethoprim] Shortness Of Breath and Rash  . Sulfamethoxazole-Trimethoprim Rash    rash  . Tylox [Oxycodone-Acetaminophen] Rash  . Augmentin [Amoxicillin-Pot Clavulanate] Nausea And Vomiting    MEDICATIONS: Current Outpatient Medications on File Prior to Visit  Medication Sig Dispense Refill  . albuterol (PROVENTIL HFA;VENTOLIN HFA) 108 (90 Base) MCG/ACT inhaler Inhale 2 puffs into the lungs every 6 (six) hours as needed for wheezing or shortness of breath. 1 Inhaler 0  . aspirin 81 MG tablet Take 81 mg by mouth daily.     [name] buPROPion (WELLBUTRIN SR) 200 MG 12 hr tablet Take 1 tablet (200 mg total) by mouth 2 (two) times daily. 60 tablet 0  . cefdinir (OMNICEF) 300 MG capsule Take 1 capsule (300 mg total) by mouth 2 (two) times daily. 20 capsule 0  . citalopram (CELEXA) 20  MG tablet Take 1 tablet (20 mg total) by mouth daily. 90 tablet 1  . clobetasol cream (TEMOVATE) 0.17 % Apply 1 application topically 2 (two) times daily. 30 g 0  . Fluticasone-Salmeterol (ADVAIR DISKUS) 100-50 MCG/DOSE AEPB Inhale 1 puff into the lungs 2 (two) times daily. 60 each 5  . hydrochlorothiazide (MICROZIDE) 12.5 MG capsule TAKE 1 CAPSULE BY MOUTH EVERY DAY 90 capsule 2  . Insulin Pen Needle (BD PEN NEEDLE NANO 2ND GEN) 32G X 4 MM MISC 1 Package by Does not apply route 2 (two) times daily. 100 each 0  . lisinopril (PRINIVIL,ZESTRIL) 40 MG tablet  TAKE 1 TABLET BY MOUTH EVERY DAY 90 tablet 1  . metFORMIN (GLUCOPHAGE) 500 MG tablet TAKE 1 TABLET BY MOUTH TWICE A DAY 60 tablet 0  . montelukast (SINGULAIR) 10 MG tablet TAKE 1 TABLET BY MOUTH EVERYDAY AT BEDTIME 90 tablet 0  . polyethylene glycol powder (GLYCOLAX/MIRALAX) powder Take 17 g by mouth daily. 3350 g 0  . ranitidine (ZANTAC) 150 MG tablet Take 1 tablet (150 mg total) by mouth at bedtime. 90 tablet 3   No current facility-administered medications on file prior to visit.     PAST MEDICAL HISTORY: Past Medical History:  Diagnosis Date  . Anemia   . Anxiety   . Arthritis    inflammitory  . Asthma   . B12 deficiency   . Back pain   . Chronic joint pain   . Constipation   . DDD (degenerative disc disease), lumbar    low back and neck and shoulders  . Depression    during divorce & legal matters  . Fatty liver   . Fibromyalgia   . Gallbladder problem   . GERD (gastroesophageal reflux disease)   . History of kidney stones    Dr Phebe Colla  . History of polycystic ovarian disease S/P BSO  . Hx of anxiety disorder   . Hypertension    a  . Hypothyroidism    no meds  . IBS (irritable bowel syndrome)   . IBS (irritable bowel syndrome)   . Infertility, female   . Joint pain   . Kidney problem   . Lactose intolerance   . Lupus (HCC) hx positive ANA   followed by Dr Gavin Pound; possible lupus  . Multiple food allergies   . Myalgia   . Osteoarthritis   . 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 POLYCYSTIC OVARIAN DISEASE 03/02/2007   Qualifier: Diagnosis of  By: Linna Darner MD, Rae Mar BSO for cysts & endometriosis; Dr Reino Kent, Gyn Seeing Dr Paula Compton    . PONV (postoperative nausea and vomiting)   . Prediabetes   . Sweating profusely   . Symptomatic mammary hypertrophy   . URI (upper respiratory infection)    currently on cefdinir    PAST SURGICAL HISTORY: Past Surgical History:  Procedure Laterality Date  . [place]   exploratory lap  . BREAST REDUCTION SURGERY Bilateral 03/26/2015   Procedure: BILATERAL BREAST REDUCTION ;  Surgeon: Wallace Going, DO;  Location: [facility];  Service: Plastics;  Laterality: Bilateral;  . CARPAL TUNNEL RELEASE     bilateral; Dr Sherwood Gambler  . COLONOSCOPY     in 1990s  . CYSTO/ RIGHT RETROGRADE URETERAL PYELOGRAM  07-09-2004   HX BILATERAL RENAL STONES/ RIGHT FLANK PAIN  . CYSTOSCOPY W/ URETERAL STENT PLACEMENT  11/12/2011   Procedure: CYSTOSCOPY WITH RETROGRADE  PYELOGRAM/URETERAL STENT PLACEMENT;  Surgeon: Alexis Frock, MD;  Location: [facility];  Service: Urology;  Laterality: Right;  . CYSTOSCOPY W/ URETERAL STENT REMOVAL  12/07/2011   Procedure: CYSTOSCOPY WITH STENT REMOVAL;  Surgeon: Alexis Frock, MD;  Location: [facility];  Service: Urology;  Laterality: Right;  . CYSTOSCOPY/RETROGRADE/URETEROSCOPY/STONE EXTRACTION WITH BASKET  12/07/2011   Procedure: CYSTOSCOPY/RETROGRADE/URETEROSCOPY/STONE EXTRACTION WITH BASKET;  Surgeon: Alexis Frock, MD;  Location: [facility];  Service: Urology;  Laterality: Right;  . DILATION AND CURETTAGE OF UTERUS     X5  . EXCISION LEFT BARTHOLIN GLAND  02-05-2002  . EYE SURGERY     Retinal tears surgery bilateral  . LAPAROSCOPIC ASSISTED VAGINAL HYSTERECTOMY  2000  . LAPAROSCOPIC CHOLECYSTECTOMY  05-31-2007  . LAPAROSCOPIC LASER ABLATION ENDOMETRIOSIS AND LEFT SALPINGO-OOPHORECTOMY  11-03-1999  . LAPAROSCOPY WITH RIGHT SALPINGO-OOPHECTOMY/ LYSIS ADHESIONS AND ABLATION ENDOMETRIOSIS  05-17-2001  . LIPOSUCTION Bilateral 03/26/2015   Procedure: WITH LIPOSUCTION;  Surgeon: Wallace Going, DO;  Location: [facility];  Service: Plastics;  Laterality: Bilateral;  . LUMBAR LAMINECTOMY  2003   L4 - L5  . PLANTAR FASCIA SURGERY     right  . PLANTAR FASCIA SURGERY  02/2011   left  . RIGHT URETEROSCOPIC STONE EXTRACTION  08-04-2000  . TONSILLECTOMY    . URETERAL STENT  PLACEMENT  11/12/11   Dr Tresa Moore    SOCIAL HISTORY: Social History   Tobacco Use  . Smoking status: Never Smoker  . Smokeless tobacco: Never Used  Substance Use Topics  . Alcohol use: Yes    Alcohol/week: 1.0 standard drinks    Types: 1 Shots of liquor per week    Comment:  very rarely  . Drug use: No    FAMILY HISTORY: Family History  Problem Relation Age of Onset  . Hypertension Mother   . Colon polyps Mother   . Atrial fibrillation Mother        Dr Angelena Form  . Hyperlipidemia Mother   . Heart disease Mother   . Anxiety disorder Mother   . Sleep apnea Mother   . Obesity Mother   . Heart attack Father 62  . Diabetes Father   . Hypertension Father   . Hyperlipidemia Father   . Heart disease Father   . Stroke Father   . Obesity Father   . Hypertension Sister   . Heart attack Paternal Grandmother 61  . Breast cancer Paternal Aunt   . Colon cancer Maternal Uncle   . Colon cancer Paternal Uncle   . Colon polyps Maternal Grandmother   . Stroke Maternal Grandmother 76  . Diabetes Paternal Grandfather   . Stroke Maternal Grandfather 37    ROS: Review of Systems  All other systems reviewed and are negative.   PHYSICAL EXAM: Blood pressure 118/75, pulse 71, temperature 98.4 F (36.9 C), temperature source Oral, height 5\' 8"  (1.727 m), weight 231 lb (104.8 kg), SpO2 100 %. Body mass index is 35.12 kg/m. Physical Exam  Constitutional: She is oriented to person, place, and time. [name].  Eyes: Pupils are equal, round, and reactive to light.  Neck: Normal range of motion. Neck supple.  Cardiovascular: Normal rate.  Pulmonary/Chest: Effort normal.  Musculoskeletal: Normal range of motion.  Neurological: She is alert and oriented to person, place, and time.  Skin: Skin is warm.  Psychiatric: She has a normal mood and affect. Her behavior is normal.  Vitals reviewed.   RECENT LABS AND TESTS: BMET  Component Value Date/Time    NA 142 01/09/2018 1338   K 4.5 01/09/2018 1338   CL 104 01/09/2018 1338   CO2 22 01/09/2018 1338   GLUCOSE 74 01/09/2018 1338   GLUCOSE 92 08/16/2016 1011   BUN 20 01/09/2018 1338   CREATININE 0.62 01/09/2018 1338   CALCIUM 9.4 01/09/2018 1338   GFRNONAA 107 01/09/2018 1338   GFRAA 123 01/09/2018 1338   Lab Results  Component Value Date   HGBA1C 5.4 01/09/2018   HGBA1C 5.8 (H) 07/04/2017   HGBA1C 5.6 02/06/2017   HGBA1C 5.6 09/28/2016   HGBA1C 5.9 08/16/2016   Lab Results  Component Value Date   INSULIN 15.3 01/09/2018   INSULIN 10.4 07/04/2017   INSULIN 28.1 (H) 02/06/2017   INSULIN 14.7 09/28/2016   CBC    Component Value Date/Time   WBC 7.6 01/09/2018 1338   WBC 6.6 08/16/2016 1011   RBC 4.69 01/09/2018 1338   RBC 4.68 08/16/2016 1011   HGB 13.1 01/09/2018 1338   HCT 39.5 01/09/2018 1338   PLT 235.0 08/16/2016 1011   MCV 84 01/09/2018 1338   MCH 27.9 01/09/2018 1338   MCH 27.0 04/19/2015 0650   MCHC 33.2 01/09/2018 1338   MCHC 33.4 08/16/2016 1011   RDW 14.7 01/09/2018 1338   LYMPHSABS 2.4 01/09/2018 1338   MONOABS 0.6 08/16/2016 1011   EOSABS 0.2 01/09/2018 1338   BASOSABS 0.1 01/09/2018 1338   Iron/TIBC/Ferritin/ %Sat No results found for: IRON, TIBC, FERRITIN, IRONPCTSAT Lipid Panel     Component Value Date/Time   CHOL 157 01/09/2018 1338   TRIG 84 01/09/2018 1338   HDL 58 01/09/2018 1338   CHOLHDL 3 08/16/2016 1011   VLDL 12.2 08/16/2016 1011   LDLCALC 82 01/09/2018 1338   Hepatic Function Panel     Component Value Date/Time   PROT 6.9 01/09/2018 1338   ALBUMIN 4.4 01/09/2018 1338   AST 18 01/09/2018 1338   ALT 21 01/09/2018 1338   ALKPHOS 88 01/09/2018 1338   BILITOT 0.3 01/09/2018 1338   BILIDIR 0.1 06/12/2013 1632   IBILI 0.2 06/11/2009 2309      Component Value Date/Time   TSH 1.840 01/09/2018 1338   TSH 2.100 07/04/2017 1216   TSH 1.54 08/16/2016 1011    ASSESSMENT AND PLAN: Prediabetes - Plan: liraglutide ([place]) 18  MG/3ML SOPN  Depression with anxiety - Plan: topiramate (TOPAMAX) 50 MG tablet  Vitamin D deficiency - Plan: Vitamin D, Ergocalciferol, (DRISDOL) 50000 units CAPS capsule  Class 2 severe obesity with serious comorbidity and body mass index (BMI) of 35.0 to 35.9 in adult, unspecified obesity type ([place])  PLAN: Pre-Diabetes [name] will continue to work on weight loss, exercise, and decreasing simple carbohydrates in her diet to help decrease the risk of diabetes. We dicussed metformin including benefits and risks. She was informed that eating too many simple carbohydrates or too many calories at one sitting increases the likelihood of GI side effects. [name] declined metformin for now and a prescription was not written today. [name] agreed to follow up with [place] as directed to monitor her progress. A prescription was written today for Victoza with no refills.   Vitamin D Deficiency [name] was informed that low vitamin D levels contributes to fatigue and are associated with obesity, breast, and colon cancer. She agrees to continue to take prescription Vit D @50 ,000 IU every three days and will follow up for routine testing of vitamin D, at least 2-3 times per year. She was informed of  the risk of over-replacement of vitamin D and agrees to not increase her dose unless she discusses this with [place] first.  Depression with Emotional Eating Behaviors We discussed behavior modification techniques today to help [name] deal with her emotional eating and depression. She has agreed to take  Continue Wellbutrin SR 150 mg qd and agreed to follow up as directed. Patient feels she is doing better with emotional eating on the Topamax without suicidal idealtions.   Obesity [name] is currently in the action stage of change. As such, her goal is to continue with weight loss efforts She has agreed to keep a food journal with 1400-1600 calories and 90+ protein  [name] has been instructed to work up to a  goal of 150 minutes of combined cardio and strengthening exercise per week for weight loss and overall health benefits. We discussed the following Behavioral Modification Stratagies today: increasing lean protein intake, decreasing simple carbohydrates  and work on meal planning and easy cooking plans   [name] has agreed to follow up with our clinic in 2 weeks. She was informed of the importance of frequent follow up visits to maximize her success with intensive lifestyle modifications for her multiple health conditions.   OBESITY BEHAVIORAL INTERVENTION VISIT  Today's visit was # 25  Starting weight: 241 lbs Starting date: 09/28/16 Today's weight : Weight: 231 lb (104.8 kg)  Today's date: 01/31/2018 Total lbs lost to date: 50    ASK: We discussed the diagnosis of obesity with [name] today and [name] agreed to give [place] permission to discuss obesity behavioral modification therapy today.  ASSESS: [name] has the diagnosis of obesity and her BMI today is 35.2. [name] is in the action stage of change .  ADVISE: [name] was educated on the multiple health risks of obesity as well as the benefit of weight loss to improve her health. She was advised of the need for long term treatment and the importance of lifestyle modifications to improve her current health and to decrease her risk of future health problems.  AGREE: Multiple dietary modification options and treatment options were discussed and  [name] agreed to follow the recommendations documented in the above note.  ARRANGE: [name] was educated on the importance of frequent visits to treat obesity as outlined per CMS and USPSTF guidelines and agreed to schedule her next follow up appointment today.  I, April Moore, am acting as Location manager for Dr Dennard Nip.   I have reviewed the above documentation for accuracy and completeness, and I agree with the above. -Dennard Nip, MD

## 2018-02-01 ENCOUNTER — Encounter (INDEPENDENT_AMBULATORY_CARE_PROVIDER_SITE_OTHER): Payer: Self-pay | Admitting: Family Medicine

## 2018-02-07 ENCOUNTER — Other Ambulatory Visit: Payer: Self-pay | Admitting: Internal Medicine

## 2018-02-10 ENCOUNTER — Other Ambulatory Visit (INDEPENDENT_AMBULATORY_CARE_PROVIDER_SITE_OTHER): Payer: Self-pay | Admitting: Family Medicine

## 2018-02-10 DIAGNOSIS — R7303 Prediabetes: Secondary | ICD-10-CM

## 2018-02-15 DIAGNOSIS — Z79899 Other long term (current) drug therapy: Secondary | ICD-10-CM | POA: Diagnosis not present

## 2018-02-15 DIAGNOSIS — Z6835 Body mass index (BMI) 35.0-35.9, adult: Secondary | ICD-10-CM | POA: Diagnosis not present

## 2018-02-15 DIAGNOSIS — M064 Inflammatory polyarthropathy: Secondary | ICD-10-CM | POA: Diagnosis not present

## 2018-02-15 DIAGNOSIS — E669 Obesity, unspecified: Secondary | ICD-10-CM | POA: Diagnosis not present

## 2018-02-15 DIAGNOSIS — M2550 Pain in unspecified joint: Secondary | ICD-10-CM | POA: Diagnosis not present

## 2018-02-15 DIAGNOSIS — M159 Polyosteoarthritis, unspecified: Secondary | ICD-10-CM | POA: Diagnosis not present

## 2018-02-21 ENCOUNTER — Other Ambulatory Visit (INDEPENDENT_AMBULATORY_CARE_PROVIDER_SITE_OTHER): Payer: Self-pay | Admitting: Family Medicine

## 2018-02-21 ENCOUNTER — Ambulatory Visit (INDEPENDENT_AMBULATORY_CARE_PROVIDER_SITE_OTHER): Payer: PPO | Admitting: Family Medicine

## 2018-02-21 VITALS — BP 114/76 | HR 72 | Temp 98.7°F | Ht 68.0 in | Wt 230.0 lb

## 2018-02-21 DIAGNOSIS — R7303 Prediabetes: Secondary | ICD-10-CM

## 2018-02-21 DIAGNOSIS — Z6835 Body mass index (BMI) 35.0-35.9, adult: Secondary | ICD-10-CM

## 2018-02-21 MED ORDER — INSULIN PEN NEEDLE 32G X 4 MM MISC: 1.0000 | Freq: Two times a day (BID) | 0 refills | Status: DC

## 2018-02-21 NOTE — Progress Notes (Signed)
Office: [phone]  /  Fax: [phone]   HPI:   Chief Complaint: OBESITY [name] is here to discuss her progress with her obesity treatment plan. She is on the keep a food journal with 1400-1600 calories and 90+ grams of protein daily and is following her eating plan approximately 90 % of the time. She states she is walking and at the gym for 60 minutes 7 times per week. [name] is doing better with journaling and has gone back to losing weight, but she is discouraged it isn't faster.  Her weight is 230 lb (104.3 kg) today and has had a weight loss of 1 pound over a period of 3 weeks since her last visit. She has lost 11 lbs since starting treatment with [place].  Pre-Diabetes [name] has a diagnosis of pre-diabetes based on her elevated Hgb A1c and was informed this puts her at greater risk of developing diabetes. Sheis stable on metformin and Victoza, and she denies nausea, vomiting, or hypoglycemia. She continues to work on diet and exercise to decrease risk of diabetes.   ALLERGIES: Allergies  Allergen Reactions  . Chloraprep One Step [Chlorhexidine Gluconate] Rash    Developed severe rash where chloraprep was used on chest area  . Ivp Dye [Iodinated Diagnostic Agents] Rash and Other (See Comments)    Flushing, minor facial rash & dyspnea  . Nalbuphine Shortness Of Breath and Rash    Nubain caused respiratory distress & rash  . Septra [Sulfamethoxazole-Trimethoprim] Shortness Of Breath and Rash  . Sulfamethoxazole-Trimethoprim Rash    rash  . Tylox [Oxycodone-Acetaminophen] Rash  . Augmentin [Amoxicillin-Pot Clavulanate] Nausea And Vomiting    MEDICATIONS: Current Outpatient Medications on File Prior to Visit  Medication Sig Dispense Refill  . albuterol (PROVENTIL HFA;VENTOLIN HFA) 108 (90 Base) MCG/ACT inhaler Inhale 2 puffs into the lungs every 6 (six) hours as needed for wheezing or shortness of breath. 1 Inhaler 0  . aspirin 81 MG tablet Take 81 mg by mouth daily.      [name] buPROPion (WELLBUTRIN SR) 200 MG 12 hr tablet Take 1 tablet (200 mg total) by mouth 2 (two) times daily. 60 tablet 0  . citalopram (CELEXA) 20 MG tablet TAKE 1 TABLET BY MOUTH EVERY DAY 90 tablet 1  . clobetasol cream (TEMOVATE) 4.16 % Apply 1 application topically 2 (two) times daily. 30 g 0  . Fluticasone-Salmeterol (ADVAIR DISKUS) 100-50 MCG/DOSE AEPB Inhale 1 puff into the lungs 2 (two) times daily. 60 each 5  . hydrochlorothiazide (MICROZIDE) 12.5 MG capsule TAKE 1 CAPSULE BY MOUTH EVERY DAY 90 capsule 2  . liraglutide (VICTOZA) 18 MG/3ML SOPN Inject 0.2 mLs (1.2 mg total) into the skin every morning. 2 pen 0  . lisinopril (PRINIVIL,ZESTRIL) 40 MG tablet TAKE 1 TABLET BY MOUTH EVERY DAY 90 tablet 1  . metFORMIN (GLUCOPHAGE) 500 MG tablet TAKE 1 TABLET BY MOUTH TWICE A DAY 60 tablet 0  . montelukast (SINGULAIR) 10 MG tablet TAKE 1 TABLET BY MOUTH EVERYDAY AT BEDTIME 90 tablet 0  . polyethylene glycol powder (GLYCOLAX/MIRALAX) powder Take 17 g by mouth daily. 3350 g 0  . ranitidine (ZANTAC) 150 MG tablet Take 1 tablet (150 mg total) by mouth at bedtime. 90 tablet 3  . topiramate (TOPAMAX) 50 MG tablet Take 1 tablet (50 mg total) by mouth daily. 30 tablet 0  . Vitamin D, Ergocalciferol, (DRISDOL) 50000 units CAPS capsule Take 1 capsule (50,000 Units total) by mouth every 3 (three) days. 10 capsule 0   No current  facility-administered medications on file prior to visit.     PAST MEDICAL HISTORY: Past Medical History:  Diagnosis Date  . Anemia   . Anxiety   . Arthritis    inflammitory  . Asthma   . B12 deficiency   . Back pain   . Chronic joint pain   . Constipation   . DDD (degenerative disc disease), lumbar    low back and neck and shoulders  . Depression    during divorce & legal matters  . Fatty liver   . Fibromyalgia   . Gallbladder problem   . GERD (gastroesophageal reflux disease)   . History of kidney stones    Dr Phebe Colla  . History of polycystic ovarian disease  S/P BSO  . Hx of anxiety disorder   . Hypertension    a  . Hypothyroidism    no meds  . IBS (irritable bowel syndrome)   . IBS (irritable bowel syndrome)   . Infertility, female   . Joint pain   . Kidney problem   . Lactose intolerance   . Lupus (HCC) hx positive ANA   followed by Dr Gavin Pound; possible lupus  . Multiple food allergies   . Myalgia   . Osteoarthritis   . 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 POLYCYSTIC OVARIAN DISEASE 03/02/2007   Qualifier: Diagnosis of  By: Linna Darner MD, Rae Mar BSO for cysts & endometriosis; Dr Reino Kent, Gyn Seeing Dr Paula Compton    . PONV (postoperative nausea and vomiting)   . Prediabetes   . Sweating profusely   . Symptomatic mammary hypertrophy   . URI (upper respiratory infection)    currently on cefdinir    PAST SURGICAL HISTORY: Past Surgical History:  Procedure Laterality Date  . [facility]   exploratory lap  . BREAST REDUCTION SURGERY Bilateral 03/26/2015   Procedure: BILATERAL BREAST REDUCTION ;  Surgeon: Wallace Going, DO;  Location: [place];  Service: Plastics;  Laterality: Bilateral;  . CARPAL TUNNEL RELEASE     bilateral; Dr Sherwood Gambler  . COLONOSCOPY     in 1990s  . CYSTO/ RIGHT RETROGRADE URETERAL PYELOGRAM  07-09-2004   HX BILATERAL RENAL STONES/ RIGHT FLANK PAIN  . CYSTOSCOPY W/ URETERAL STENT PLACEMENT  11/12/2011   Procedure: CYSTOSCOPY WITH RETROGRADE PYELOGRAM/URETERAL STENT PLACEMENT;  Surgeon: Alexis Frock, MD;  Location: [facility];  Service: Urology;  Laterality: Right;  . CYSTOSCOPY W/ URETERAL STENT REMOVAL  12/07/2011   Procedure: CYSTOSCOPY WITH STENT REMOVAL;  Surgeon: Alexis Frock, MD;  Location[facility];  Service: Urology;  Laterality: Right;  . CYSTOSCOPY/RETROGRADE/URETEROSCOPY/STONE EXTRACTION WITH BASKET  12/07/2011   Procedure: CYSTOSCOPY/RETROGRADE/URETEROSCOPY/STONE EXTRACTION WITH BASKET;  Surgeon: Alexis Frock, MD;   Location: [facility];  Service: Urology;  Laterality: Right;  . DILATION AND CURETTAGE OF UTERUS     X5  . EXCISION LEFT BARTHOLIN GLAND  02-05-2002  . EYE SURGERY     Retinal tears surgery bilateral  . LAPAROSCOPIC ASSISTED VAGINAL HYSTERECTOMY  2000  . LAPAROSCOPIC CHOLECYSTECTOMY  05-31-2007  . LAPAROSCOPIC LASER ABLATION ENDOMETRIOSIS AND LEFT SALPINGO-OOPHORECTOMY  11-03-1999  . LAPAROSCOPY WITH RIGHT SALPINGO-OOPHECTOMY/ LYSIS ADHESIONS AND ABLATION ENDOMETRIOSIS  05-17-2001  . LIPOSUCTION Bilateral 03/26/2015   Procedure: WITH LIPOSUCTION;  Surgeon: Wallace Going, DO;  Location: [place];  Service: Plastics;  Laterality: Bilateral;  . LUMBAR LAMINECTOMY  2003   L4 - L5  .  PLANTAR FASCIA SURGERY     right  . PLANTAR FASCIA SURGERY  02/2011   left  . RIGHT URETEROSCOPIC STONE EXTRACTION  08-04-2000  . TONSILLECTOMY    . URETERAL STENT PLACEMENT  11/12/11   Dr Tresa Moore    SOCIAL HISTORY: Social History   Tobacco Use  . Smoking status: Never Smoker  . Smokeless tobacco: Never Used  Substance Use Topics  . Alcohol use: Yes    Alcohol/week: 1.0 standard drinks    Types: 1 Shots of liquor per week    Comment:  very rarely  . Drug use: No    FAMILY HISTORY: Family History  Problem Relation Age of Onset  . Hypertension Mother   . Colon polyps Mother   . Atrial fibrillation Mother        Dr Angelena Form  . Hyperlipidemia Mother   . Heart disease Mother   . Anxiety disorder Mother   . Sleep apnea Mother   . Obesity Mother   . Heart attack Father 60  . Diabetes Father   . Hypertension Father   . Hyperlipidemia Father   . Heart disease Father   . Stroke Father   . Obesity Father   . Hypertension Sister   . Heart attack Paternal Grandmother 32  . Breast cancer Paternal Aunt   . Colon cancer Maternal Uncle   . Colon cancer Paternal Uncle   . Colon polyps Maternal Grandmother   . Stroke Maternal Grandmother 76  . Diabetes Paternal  Grandfather   . Stroke Maternal Grandfather 37    ROS: Review of Systems  Constitutional: Positive for weight loss.  Gastrointestinal: Negative for nausea and vomiting.  Endo/Heme/Allergies:       Negative hypoglycemia    PHYSICAL EXAM: Blood pressure 114/76, pulse 72, temperature 98.7 F (37.1 C), temperature source Oral, height 5\' 8"  (1.727 m), weight 230 lb (104.3 kg), SpO2 100 %. Body mass index is 34.97 kg/m. Physical Exam  Constitutional: She is oriented to person, place, and time. [name].  Cardiovascular: Normal rate.  Pulmonary/Chest: Effort normal.  Musculoskeletal: Normal range of motion.  Neurological: She is oriented to person, place, and time.  Skin: Skin is warm and dry.  Psychiatric: She has a normal mood and affect. Her behavior is normal.  Vitals reviewed.   RECENT LABS AND TESTS: BMET    Component Value Date/Time   NA 142 01/09/2018 1338   K 4.5 01/09/2018 1338   CL 104 01/09/2018 1338   CO2 22 01/09/2018 1338   GLUCOSE 74 01/09/2018 1338   GLUCOSE 92 08/16/2016 1011   BUN 20 01/09/2018 1338   CREATININE 0.62 01/09/2018 1338   CALCIUM 9.4 01/09/2018 1338   GFRNONAA 107 01/09/2018 1338   GFRAA 123 01/09/2018 1338   Lab Results  Component Value Date   HGBA1C 5.4 01/09/2018   HGBA1C 5.8 (H) 07/04/2017   HGBA1C 5.6 02/06/2017   HGBA1C 5.6 09/28/2016   HGBA1C 5.9 08/16/2016   Lab Results  Component Value Date   INSULIN 15.3 01/09/2018   INSULIN 10.4 07/04/2017   INSULIN 28.1 (H) 02/06/2017   INSULIN 14.7 09/28/2016   CBC    Component Value Date/Time   WBC 7.6 01/09/2018 1338   WBC 6.6 08/16/2016 1011   RBC 4.69 01/09/2018 1338   RBC 4.68 08/16/2016 1011   HGB 13.1 01/09/2018 1338   HCT 39.5 01/09/2018 1338   PLT 235.0 08/16/2016 1011   MCV 84 01/09/2018 1338   MCH 27.9 01/09/2018  1338   MCH 27.0 04/19/2015 0650   MCHC 33.2 01/09/2018 1338   MCHC 33.4 08/16/2016 1011   RDW 14.7 01/09/2018 1338    LYMPHSABS 2.4 01/09/2018 1338   MONOABS 0.6 08/16/2016 1011   EOSABS 0.2 01/09/2018 1338   BASOSABS 0.1 01/09/2018 1338   Iron/TIBC/Ferritin/ %Sat No results found for: IRON, TIBC, FERRITIN, IRONPCTSAT Lipid Panel     Component Value Date/Time   CHOL 157 01/09/2018 1338   TRIG 84 01/09/2018 1338   HDL 58 01/09/2018 1338   CHOLHDL 3 08/16/2016 1011   VLDL 12.2 08/16/2016 1011   LDLCALC 82 01/09/2018 1338   Hepatic Function Panel     Component Value Date/Time   PROT 6.9 01/09/2018 1338   ALBUMIN 4.4 01/09/2018 1338   AST 18 01/09/2018 1338   ALT 21 01/09/2018 1338   ALKPHOS 88 01/09/2018 1338   BILITOT 0.3 01/09/2018 1338   BILIDIR 0.1 06/12/2013 1632   IBILI 0.2 06/11/2009 2309      Component Value Date/Time   TSH 1.840 01/09/2018 1338   TSH 2.100 07/04/2017 1216   TSH 1.54 08/16/2016 1011    ASSESSMENT AND PLAN: Prediabetes - Plan: Insulin Pen Needle (BD PEN NEEDLE NANO 2ND GEN) 32G X 4 MM MISC  Class 2 severe obesity with serious comorbidity and body mass index (BMI) of 35.0 to 35.9 in adult, unspecified obesity type ([name])  PLAN:  Pre-Diabetes [name] will continue to work on weight loss, diet, exercise, and decreasing simple carbohydrates in her diet to help decrease the risk of diabetes. We dicussed metformin including benefits and risks. She was informed that eating too many simple carbohydrates or too many calories at one sitting increases the likelihood of GI side effects. [name] agrees to continue taking metformin and Victoza, and we will refill pen needles. [name] agrees to follow up with our clinic in 3 weeks as directed to monitor her progress.  Obesity [name] is currently in the action stage of change. As such, her goal is to continue with weight loss efforts She has agreed to keep a food journal with 1500-1600 calories and 100+ grams of protein daily [name] has been instructed to work up to a goal of 150 minutes of combined cardio and  strengthening exercise per week for weight loss and overall health benefits. We discussed the following Behavioral Modification Strategies today: increasing lean protein intake, decreasing simple carbohydrates  and holiday eating strategies  I reassured [name] that all weight loss is good and not to be too disappointed.  [name] has agreed to follow up with our clinic in 3 weeks. She was informed of the importance of frequent follow up visits to maximize her success with intensive lifestyle modifications for her multiple health conditions.   OBESITY BEHAVIORAL INTERVENTION VISIT  Today's visit was # 26   Starting weight: 241 lbs Starting date: 09/28/16 Today's weight : 230 lbs  Today's date: 02/21/2018 Total lbs lost to date: 11 At least 15 minutes were spent on discussing the following behavioral intervention visit.   ASK: We discussed the diagnosis of obesity with [name] today and [name] agreed to give [place] permission to discuss obesity behavioral modification therapy today.  ASSESS: [name] has the diagnosis of obesity and her BMI today is 34.98 [name] is in the action stage of change   ADVISE: [name] was educated on the multiple health risks of obesity as well as the benefit of weight loss to improve her health. She was advised of the need  for long term treatment and the importance of lifestyle modifications to improve her current health and to decrease her risk of future health problems.  AGREE: Multiple dietary modification options and treatment options were discussed and  [name] agreed to follow the recommendations documented in the above note.  ARRANGE: [name] was educated on the importance of frequent visits to treat obesity as outlined per CMS and USPSTF guidelines and agreed to schedule her next follow up appointment today.  I, [name], am acting as transcriptionist for Frances Nip, MD  I have reviewed the above documentation for  accuracy and completeness, and I agree with the above. -Frances Nip, MD

## 2018-02-22 ENCOUNTER — Other Ambulatory Visit (INDEPENDENT_AMBULATORY_CARE_PROVIDER_SITE_OTHER): Payer: Self-pay | Admitting: Family Medicine

## 2018-02-22 DIAGNOSIS — F418 Other specified anxiety disorders: Secondary | ICD-10-CM

## 2018-02-24 ENCOUNTER — Other Ambulatory Visit: Payer: Self-pay | Admitting: Internal Medicine

## 2018-03-06 ENCOUNTER — Encounter (INDEPENDENT_AMBULATORY_CARE_PROVIDER_SITE_OTHER): Payer: Self-pay | Admitting: Family Medicine

## 2018-03-07 DIAGNOSIS — M25552 Pain in left hip: Secondary | ICD-10-CM | POA: Diagnosis not present

## 2018-03-11 ENCOUNTER — Other Ambulatory Visit: Payer: Self-pay | Admitting: Internal Medicine

## 2018-03-14 ENCOUNTER — Encounter (INDEPENDENT_AMBULATORY_CARE_PROVIDER_SITE_OTHER): Payer: Self-pay | Admitting: Family Medicine

## 2018-03-14 ENCOUNTER — Ambulatory Visit (INDEPENDENT_AMBULATORY_CARE_PROVIDER_SITE_OTHER): Payer: PPO | Admitting: Family Medicine

## 2018-03-14 VITALS — BP 119/75 | HR 76 | Temp 98.9°F | Ht 68.0 in | Wt 226.0 lb

## 2018-03-14 DIAGNOSIS — E669 Obesity, unspecified: Secondary | ICD-10-CM

## 2018-03-14 DIAGNOSIS — R7303 Prediabetes: Secondary | ICD-10-CM | POA: Diagnosis not present

## 2018-03-14 DIAGNOSIS — F418 Other specified anxiety disorders: Secondary | ICD-10-CM

## 2018-03-14 DIAGNOSIS — Z6834 Body mass index (BMI) 34.0-34.9, adult: Secondary | ICD-10-CM

## 2018-03-14 MED ORDER — TOPIRAMATE 50 MG PO TABS: 50.0000 mg | ORAL_TABLET | Freq: Every day | ORAL | 0 refills | Status: DC

## 2018-03-14 MED ORDER — LIRAGLUTIDE 18 MG/3ML ~~LOC~~ SOPN: 1.2000 mg | PEN_INJECTOR | SUBCUTANEOUS | 0 refills | Status: DC

## 2018-03-14 MED ORDER — METFORMIN HCL 500 MG PO TABS: 500.0000 mg | ORAL_TABLET | Freq: Two times a day (BID) | ORAL | 0 refills | Status: DC

## 2018-03-14 NOTE — Progress Notes (Signed)
Office: [phone]  /  Fax: [phone]   HPI:   Chief Complaint: OBESITY [name] is here to discuss her progress with her obesity treatment plan. She is keeping a food journal with 1500 to 1600 calories and 100 grams of protein and is following her eating plan approximately 100 % of the time. She states she is walking and going to the gym 45 to 60 minutes 4 to 6 times per week. Zaryah has been working on [employer] protein, but has been avoiding soy as she feels this is affecting her polycystic ovarian syndrome. She has also started doing a cupping "detox" massage treatment. She feels these changes have helped her with weight loss.  Her weight is 226 lb (102.5 kg) today and has had a weight loss of 15 pounds over a period of 3 weeks since her last visit. She has lost 15 lbs since starting treatment with [place].  Insulin Resistance [name] has a diagnosis of insulin resistance based on her elevated fasting insulin level >5. Her last A1c was 5.4 on 01/09/18 and was well controlled. Although [name]'s blood glucose readings are still under good control, insulin resistance puts her at greater risk of metabolic syndrome and diabetes. She is taking metformin and Victoza. She is working on diet and exercise to decrease risk of diabetes. She denies nausea, vomiting, and hypoglycemia.  Depression with emotional eating behaviors [name] is struggling with emotional eating and using food for comfort to the extent that it is negatively impacting her health. She often snacks when she is not hungry. [name] sometimes feels she is out of control and then feels guilty that she made poor food choices. She has been working on behavior modification techniques to help reduce her emotional eating and has been somewhat successful. Her mood is stable on Topamax 50mg  and Wellbutrin 200mg , but she misses her Wellbutrin doses about 50% of the time.  ALLERGIES: Allergies  Allergen Reactions  . Chloraprep One  Step [Chlorhexidine Gluconate] Rash    Developed severe rash where chloraprep was used on chest area  . Ivp Dye [Iodinated Diagnostic Agents] Rash and Other (See Comments)    Flushing, minor facial rash & dyspnea  . Nalbuphine Shortness Of Breath and Rash    Nubain caused respiratory distress & rash  . Septra [Sulfamethoxazole-Trimethoprim] Shortness Of Breath and Rash  . Sulfamethoxazole-Trimethoprim Rash    rash  . Tylox [Oxycodone-Acetaminophen] Rash  . Augmentin [Amoxicillin-Pot Clavulanate] Nausea And Vomiting    MEDICATIONS: Current Outpatient Medications on File Prior to Visit  Medication Sig Dispense Refill  . albuterol (PROVENTIL HFA;VENTOLIN HFA) 108 (90 Base) MCG/ACT inhaler Inhale 2 puffs into the lungs every 6 (six) hours as needed for wheezing or shortness of breath. 1 Inhaler 0  . aspirin 81 MG tablet Take 81 mg by mouth daily.     [name] buPROPion (WELLBUTRIN SR) 200 MG 12 hr tablet Take 1 tablet (200 mg total) by mouth 2 (two) times daily. 60 tablet 0  . citalopram (CELEXA) 20 MG tablet TAKE 1 TABLET BY MOUTH EVERY DAY 90 tablet 1  . clobetasol cream (TEMOVATE) 8.84 % Apply 1 application topically 2 (two) times daily. 30 g 0  . Fluticasone-Salmeterol (ADVAIR DISKUS) 100-50 MCG/DOSE AEPB Inhale 1 puff into the lungs 2 (two) times daily. 60 each 5  . hydrochlorothiazide (MICROZIDE) 12.5 MG capsule TAKE 1 CAPSULE BY MOUTH EVERY DAY 90 capsule 2  . Insulin Pen Needle (BD PEN NEEDLE NANO 2ND GEN) 32G X 4 MM MISC 1  Package by Does not apply route 2 (two) times daily. 100 each 0  . lisinopril (PRINIVIL,ZESTRIL) 40 MG tablet TAKE 1 TABLET BY MOUTH EVERY DAY 90 tablet 1  . montelukast (SINGULAIR) 10 MG tablet Take 1 tablet (10 mg total) by mouth at bedtime. -- Office visit needed for further refills 90 tablet 0  . polyethylene glycol powder (GLYCOLAX/MIRALAX) powder Take 17 g by mouth daily. 3350 g 0  . ranitidine (ZANTAC) 150 MG tablet Take 1 tablet (150 mg total) by mouth at bedtime.  90 tablet 3  . Vitamin D, Ergocalciferol, (DRISDOL) 50000 units CAPS capsule Take 1 capsule (50,000 Units total) by mouth every 3 (three) days. 10 capsule 0   No current facility-administered medications on file prior to visit.     PAST MEDICAL HISTORY: Past Medical History:  Diagnosis Date  . Anemia   . Anxiety   . Arthritis    inflammitory  . Asthma   . B12 deficiency   . Back pain   . Chronic joint pain   . Constipation   . DDD (degenerative disc disease), lumbar    low back and neck and shoulders  . Depression    during divorce & legal matters  . Fatty liver   . Fibromyalgia   . Gallbladder problem   . GERD (gastroesophageal reflux disease)   . History of kidney stones    Dr Phebe Colla  . History of polycystic ovarian disease S/P BSO  . Hx of anxiety disorder   . Hypertension    a  . Hypothyroidism    no meds  . IBS (irritable bowel syndrome)   . IBS (irritable bowel syndrome)   . Infertility, female   . Joint pain   . Kidney problem   . Lactose intolerance   . Lupus (HCC) hx positive ANA   followed by Dr Gavin Pound; possible lupus  . Multiple food allergies   . Myalgia   . Osteoarthritis   . 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 POLYCYSTIC OVARIAN DISEASE 03/02/2007   Qualifier: Diagnosis of  By: Linna Darner MD, Rae Mar BSO for cysts & endometriosis; Dr Reino Kent, Gyn Seeing Dr Paula Compton    . PONV (postoperative nausea and vomiting)   . Prediabetes   . Sweating profusely   . Symptomatic mammary hypertrophy   . URI (upper respiratory infection)    currently on cefdinir    PAST SURGICAL HISTORY: Past Surgical History:  Procedure Laterality Date  . [place]   exploratory lap  . BREAST REDUCTION SURGERY Bilateral 03/26/2015   Procedure: BILATERAL BREAST REDUCTION ;  Surgeon: Wallace Going, DO;  Location: [facility];  Service: Plastics;  Laterality: Bilateral;  . CARPAL TUNNEL RELEASE      bilateral; Dr Sherwood Gambler  . COLONOSCOPY     in 1990s  . CYSTO/ RIGHT RETROGRADE URETERAL PYELOGRAM  07-09-2004   HX BILATERAL RENAL STONES/ RIGHT FLANK PAIN  . CYSTOSCOPY W/ URETERAL STENT PLACEMENT  11/12/2011   Procedure: CYSTOSCOPY WITH RETROGRADE PYELOGRAM/URETERAL STENT PLACEMENT;  Surgeon: Alexis Frock, MD;  Location: [facility];  Service: Urology;  Laterality: Right;  . CYSTOSCOPY W/ URETERAL STENT REMOVAL  12/07/2011   Procedure: CYSTOSCOPY WITH STENT REMOVAL;  Surgeon: Alexis Frock, MD;  Location: [facility];  Service: Urology;  Laterality: Right;  . CYSTOSCOPY/RETROGRADE/URETEROSCOPY/STONE EXTRACTION WITH BASKET  12/07/2011   Procedure: CYSTOSCOPY/RETROGRADE/URETEROSCOPY/STONE EXTRACTION WITH BASKET;  Surgeon: Alexis Frock, MD;  Location: [facility];  Service: Urology;  Laterality: Right;  . DILATION AND CURETTAGE OF UTERUS     X5  . EXCISION LEFT BARTHOLIN GLAND  02-05-2002  . EYE SURGERY     Retinal tears surgery bilateral  . LAPAROSCOPIC ASSISTED VAGINAL HYSTERECTOMY  2000  . LAPAROSCOPIC CHOLECYSTECTOMY  05-31-2007  . LAPAROSCOPIC LASER ABLATION ENDOMETRIOSIS AND LEFT SALPINGO-OOPHORECTOMY  11-03-1999  . LAPAROSCOPY WITH RIGHT SALPINGO-OOPHECTOMY/ LYSIS ADHESIONS AND ABLATION ENDOMETRIOSIS  05-17-2001  . LIPOSUCTION Bilateral 03/26/2015   Procedure: WITH LIPOSUCTION;  Surgeon: Wallace Going, DO;  Location: [facility];  Service: Plastics;  Laterality: Bilateral;  . LUMBAR LAMINECTOMY  2003   L4 - L5  . PLANTAR FASCIA SURGERY     right  . PLANTAR FASCIA SURGERY  02/2011   left  . RIGHT URETEROSCOPIC STONE EXTRACTION  08-04-2000  . TONSILLECTOMY    . URETERAL STENT PLACEMENT  11/12/11   Dr Tresa Moore    SOCIAL HISTORY: Social History   Tobacco Use  . Smoking status: Never Smoker  . Smokeless tobacco: Never Used  Substance Use Topics  . Alcohol use: Yes    Alcohol/week: 1.0 standard drinks    Types: 1 Shots of liquor per  week    Comment:  very rarely  . Drug use: No    FAMILY HISTORY: Family History  Problem Relation Age of Onset  . Hypertension Mother   . Colon polyps Mother   . Atrial fibrillation Mother        Dr Angelena Form  . Hyperlipidemia Mother   . Heart disease Mother   . Anxiety disorder Mother   . Sleep apnea Mother   . Obesity Mother   . Heart attack Father 13  . Diabetes Father   . Hypertension Father   . Hyperlipidemia Father   . Heart disease Father   . Stroke Father   . Obesity Father   . Hypertension Sister   . Heart attack Paternal Grandmother 57  . Breast cancer Paternal Aunt   . Colon cancer Maternal Uncle   . Colon cancer Paternal Uncle   . Colon polyps Maternal Grandmother   . Stroke Maternal Grandmother 76  . Diabetes Paternal Grandfather   . Stroke Maternal Grandfather 37    ROS: Review of Systems  Constitutional: Positive for weight loss.  Gastrointestinal: Negative for nausea and vomiting.  Endo/Heme/Allergies:       Negative for hypoglycemia.  Psychiatric/Behavioral: Positive for depression.    PHYSICAL EXAM: Blood pressure 119/75, pulse 76, temperature 98.9 F (37.2 C), temperature source Oral, height 5\' 8"  (1.727 m), weight 226 lb (102.5 kg), SpO2 99 %. Body mass index is 34.36 kg/m. Physical Exam  Constitutional: She is oriented to person, place, and time. [name].  Cardiovascular: Normal rate.  Pulmonary/Chest: Effort normal.  Musculoskeletal: Normal range of motion.  Neurological: She is oriented to person, place, and time.  Skin: Skin is warm and dry.  Psychiatric: She has a normal mood and affect. Her behavior is normal.  Vitals reviewed.   RECENT LABS AND TESTS: BMET    Component Value Date/Time   NA 142 01/09/2018 1338   K 4.5 01/09/2018 1338   CL 104 01/09/2018 1338   CO2 22 01/09/2018 1338   GLUCOSE 74 01/09/2018 1338   GLUCOSE 92 08/16/2016 1011   BUN 20 01/09/2018 1338   CREATININE 0.62  01/09/2018 1338   CALCIUM 9.4 01/09/2018 1338   GFRNONAA 107 01/09/2018 1338  GFRAA 123 01/09/2018 1338   Lab Results  Component Value Date   HGBA1C 5.4 01/09/2018   HGBA1C 5.8 (H) 07/04/2017   HGBA1C 5.6 02/06/2017   HGBA1C 5.6 09/28/2016   HGBA1C 5.9 08/16/2016   Lab Results  Component Value Date   INSULIN 15.3 01/09/2018   INSULIN 10.4 07/04/2017   INSULIN 28.1 (H) 02/06/2017   INSULIN 14.7 09/28/2016   CBC    Component Value Date/Time   WBC 7.6 01/09/2018 1338   WBC 6.6 08/16/2016 1011   RBC 4.69 01/09/2018 1338   RBC 4.68 08/16/2016 1011   HGB 13.1 01/09/2018 1338   HCT 39.5 01/09/2018 1338   PLT 235.0 08/16/2016 1011   MCV 84 01/09/2018 1338   MCH 27.9 01/09/2018 1338   MCH 27.0 04/19/2015 0650   MCHC 33.2 01/09/2018 1338   MCHC 33.4 08/16/2016 1011   RDW 14.7 01/09/2018 1338   LYMPHSABS 2.4 01/09/2018 1338   MONOABS 0.6 08/16/2016 1011   EOSABS 0.2 01/09/2018 1338   BASOSABS 0.1 01/09/2018 1338   Iron/TIBC/Ferritin/ %Sat No results found for: IRON, TIBC, FERRITIN, IRONPCTSAT Lipid Panel     Component Value Date/Time   CHOL 157 01/09/2018 1338   TRIG 84 01/09/2018 1338   HDL 58 01/09/2018 1338   CHOLHDL 3 08/16/2016 1011   VLDL 12.2 08/16/2016 1011   LDLCALC 82 01/09/2018 1338   Hepatic Function Panel     Component Value Date/Time   PROT 6.9 01/09/2018 1338   ALBUMIN 4.4 01/09/2018 1338   AST 18 01/09/2018 1338   ALT 21 01/09/2018 1338   ALKPHOS 88 01/09/2018 1338   BILITOT 0.3 01/09/2018 1338   BILIDIR 0.1 06/12/2013 1632   IBILI 0.2 06/11/2009 2309      Component Value Date/Time   TSH 1.840 01/09/2018 1338   TSH 2.100 07/04/2017 1216   TSH 1.54 08/16/2016 1011   Results for STUMP Seiden, Navina E "KIM" (MRN [id number]) as of 03/14/2018 15:34  Ref. Range 01/09/2018 13:38  Vitamin D, 25-Hydroxy Latest Ref Range: 30.0 - 100.0 ng/mL 39.1   ASSESSMENT AND PLAN: Prediabetes - Plan: metFORMIN (GLUCOPHAGE) 500 MG tablet, liraglutide  (VICTOZA) 18 MG/3ML SOPN  Depression with anxiety - Plan: topiramate (TOPAMAX) 50 MG tablet  Class 1 obesity with serious comorbidity and body mass index (BMI) of 34.0 to 34.9 in adult, unspecified obesity type  PLAN:  Insulin Resistance [name] will continue to work on weight loss, exercise, and decreasing simple carbohydrates in her diet to help decrease the risk of diabetes. She was informed that eating too many simple carbohydrates or too many calories at one sitting increases the likelihood of GI side effects. [name] agreed to continue taking metformin 500mg  BID #60 with no refills and Victoza 1.2mg  qAM #2 pens and prescription was written today. [name] agreed to follow up with [place] as directed to monitor her progress in 4 weeks.  Depression with Emotional Eating Behaviors We discussed behavior modification techniques today to help [name] deal with her emotional eating and depression. She has agreed to continue to take Topamax 50mg  qd #30 with no refills and agreed to follow up as directed.  Obesity [name] is currently in the action stage of change. As such, her goal is to continue with weight loss efforts. She has agreed to keep a food journal with 1500 to 1600 calories and 100 grams of protein.  [name] has been instructed to work up to a goal of 150 minutes of combined cardio and strengthening exercise per week for  weight loss and overall health benefits. We discussed the following Behavioral Modification Strategies today: increasing lean protein intake, decreasing simple carbohydrates, holiday eating strategies, and celebration eating strategies.  [name] has agreed to follow up with our clinic in 4 weeks. She was informed of the importance of frequent follow up visits to maximize her success with intensive lifestyle modifications for her multiple health conditions.   OBESITY BEHAVIORAL INTERVENTION VISIT  Today's visit was # 27   Starting weight: 241 lbs Starting date:  09/28/16 Today's weight : Weight: 226 lb (102.5 kg)  Today's date: 03/14/2018 Total lbs lost to date: 15  ASK: We discussed the diagnosis of obesity with [name] today and [name] agreed to give [place] permission to discuss obesity behavioral modification therapy today.  ASSESS: [name] has the diagnosis of obesity and her BMI today is 34.3. [name] is in the action stage of change.   ADVISE: [name] was educated on the multiple health risks of obesity as well as the benefit of weight loss to improve her health. She was advised of the need for long term treatment and the importance of lifestyle modifications to improve her current health and to decrease her risk of future health problems.  AGREE: Multiple dietary modification options and treatment options were discussed and [name] agreed to follow the recommendations documented in the above note.  ARRANGE: Karrie was educated on the importance of frequent visits to treat obesity as outlined per CMS and USPSTF guidelines and agreed to schedule her next follow up appointment today.  I, [name], am acting as transcriptionist for Starlyn Skeans, MD  I have reviewed the above documentation for accuracy and completeness, and I agree with the above. -Dennard Nip, MD

## 2018-03-30 ENCOUNTER — Other Ambulatory Visit (INDEPENDENT_AMBULATORY_CARE_PROVIDER_SITE_OTHER): Payer: Self-pay | Admitting: Family Medicine

## 2018-03-30 DIAGNOSIS — E559 Vitamin D deficiency, unspecified: Secondary | ICD-10-CM

## 2018-04-05 ENCOUNTER — Other Ambulatory Visit (INDEPENDENT_AMBULATORY_CARE_PROVIDER_SITE_OTHER): Payer: Self-pay | Admitting: Family Medicine

## 2018-04-05 DIAGNOSIS — F418 Other specified anxiety disorders: Secondary | ICD-10-CM

## 2018-04-12 ENCOUNTER — Ambulatory Visit (INDEPENDENT_AMBULATORY_CARE_PROVIDER_SITE_OTHER): Payer: PPO | Admitting: Family Medicine

## 2018-04-12 ENCOUNTER — Encounter (INDEPENDENT_AMBULATORY_CARE_PROVIDER_SITE_OTHER): Payer: Self-pay | Admitting: Family Medicine

## 2018-04-12 VITALS — BP 132/83 | HR 76 | Ht 68.0 in | Wt 224.0 lb

## 2018-04-12 DIAGNOSIS — Z6834 Body mass index (BMI) 34.0-34.9, adult: Secondary | ICD-10-CM

## 2018-04-12 DIAGNOSIS — R7303 Prediabetes: Secondary | ICD-10-CM | POA: Diagnosis not present

## 2018-04-12 DIAGNOSIS — F418 Other specified anxiety disorders: Secondary | ICD-10-CM

## 2018-04-12 DIAGNOSIS — E669 Obesity, unspecified: Secondary | ICD-10-CM

## 2018-04-12 MED ORDER — METFORMIN HCL 500 MG PO TABS: 500.0000 mg | ORAL_TABLET | Freq: Two times a day (BID) | ORAL | 0 refills | Status: DC

## 2018-04-12 MED ORDER — TOPIRAMATE 50 MG PO TABS: 50.0000 mg | ORAL_TABLET | Freq: Every day | ORAL | 0 refills | Status: DC

## 2018-04-16 NOTE — Progress Notes (Signed)
Office: [phone]  /  Fax: [phone]   HPI:   Chief Complaint: OBESITY [name] is here to discuss her progress with her obesity treatment plan. She is keeping a food journal with 1500 to 1600 calories and 100+ grams of protein and is following her eating plan approximately 75 % of the time. She states she is walking 40 to 60 minutes 2 to 3 times per week. [name] has done well with weight loss even over the holidays, but part of it was due to feeling sick and not eating. She is trying to avoid hormones in food and soy and is feeling limited.  Her weight is 224 lb (101.6 kg) today and has had a weight loss of 2 pounds over a period of 5 weeks since her last visit. She has lost 17 lbs since starting treatment with [place].  Insulin Resistance [name] has a diagnosis of insulin resistance based on her elevated fasting insulin level >5. Although [name]'s blood glucose readings are still under good control, insulin resistance puts her at greater risk of metabolic syndrome and diabetes. She is stable on metformin and Victoza currently and continues to work on diet and exercise to decrease risk of diabetes.  Depression with emotional eating behaviors [name]'s mood is stable on topiramate and she notes reduced dysgeusia and denies daytime somnolence. She is struggling with emotional eating and using food for comfort to the extent that it is negatively impacting her health.. She has been working on behavior modification techniques to help reduce her emotional eating and has been somewhat successful.   ASSESSMENT AND PLAN:  Prediabetes - Plan: metFORMIN (GLUCOPHAGE) 500 MG tablet  Depression with anxiety - Plan: topiramate (TOPAMAX) 50 MG tablet  Class 1 obesity with serious comorbidity and body mass index (BMI) of 34.0 to 34.9 in adult, unspecified obesity type  PLAN:  Insulin Resistance [name] will continue to work on weight loss, exercise, and decreasing simple carbohydrates in her  diet to help decrease the risk of diabetes. She was informed that eating too many simple carbohydrates or too many calories at one sitting increases the likelihood of GI side effects. [name] agrees to continue taking metformin 500mg  BID #60 with no refills and prescription was written today. [name] agreed to follow up with [place] as directed to monitor her progress in 3 to 4 weeks.  Depression with Emotional Eating Behaviors We discussed behavior modification techniques today to help [name] deal with her emotional eating and depression. She has agreed to continue to take topiramate 50 mg qd #30 with no refills and continue taking Wellbutrin. She agreed to follow up as directed.  Obesity [name] is currently in the action stage of change. As such, her goal is to continue with weight loss efforts. She has agreed to keep a food journal with 1500 to 1600 calories and 100+ gram of protein.  [name] has been instructed to work up to a goal of 150 minutes of combined cardio and strengthening exercise per week for weight loss and overall health benefits. We discussed the following Behavioral Modification Strategies today: increasing lean protein intake, decreasing simple carbohydrates, increasing fiber rich foods, increase H2O intake, and work on meal planning and easy cooking plans.  [name] has agreed to follow up with our clinic in 3 to 4 weeks. She was informed of the importance of frequent follow up visits to maximize her success with intensive lifestyle modifications for her multiple health conditions.  ALLERGIES: Allergies  Allergen Reactions  . Chloraprep One Step [Chlorhexidine  Gluconate] Rash    Developed severe rash where chloraprep was used on chest area  . Ivp Dye [Iodinated Diagnostic Agents] Rash and Other (See Comments)    Flushing, minor facial rash & dyspnea  . Nalbuphine Shortness Of Breath and Rash    Nubain caused respiratory distress & rash  . Septra  [Sulfamethoxazole-Trimethoprim] Shortness Of Breath and Rash  . Sulfamethoxazole-Trimethoprim Rash    rash  . Tylox [Oxycodone-Acetaminophen] Rash  . Augmentin [Amoxicillin-Pot Clavulanate] Nausea And Vomiting    MEDICATIONS: Current Outpatient Medications on File Prior to Visit  Medication Sig Dispense Refill  . albuterol (PROVENTIL HFA;VENTOLIN HFA) 108 (90 Base) MCG/ACT inhaler Inhale 2 puffs into the lungs every 6 (six) hours as needed for wheezing or shortness of breath. 1 Inhaler 0  . aspirin 81 MG tablet Take 81 mg by mouth daily.     [name] buPROPion (WELLBUTRIN SR) 200 MG 12 hr tablet Take 1 tablet (200 mg total) by mouth 2 (two) times daily. 60 tablet 0  . citalopram (CELEXA) 20 MG tablet TAKE 1 TABLET BY MOUTH EVERY DAY 90 tablet 1  . clobetasol cream (TEMOVATE) 2.54 % Apply 1 application topically 2 (two) times daily. 30 g 0  . Fluticasone-Salmeterol (ADVAIR DISKUS) 100-50 MCG/DOSE AEPB Inhale 1 puff into the lungs 2 (two) times daily. 60 each 5  . hydrochlorothiazide (MICROZIDE) 12.5 MG capsule TAKE 1 CAPSULE BY MOUTH EVERY DAY 90 capsule 2  . Insulin Pen Needle (BD PEN NEEDLE NANO 2ND GEN) 32G X 4 MM MISC 1 Package by Does not apply route 2 (two) times daily. 100 each 0  . liraglutide (VICTOZA) 18 MG/3ML SOPN Inject 0.2 mLs (1.2 mg total) into the skin every morning. 2 pen 0  . lisinopril (PRINIVIL,ZESTRIL) 40 MG tablet TAKE 1 TABLET BY MOUTH EVERY DAY 90 tablet 1  . montelukast (SINGULAIR) 10 MG tablet Take 1 tablet (10 mg total) by mouth at bedtime. -- Office visit needed for further refills 90 tablet 0  . polyethylene glycol powder (GLYCOLAX/MIRALAX) powder Take 17 g by mouth daily. 3350 g 0  . ranitidine (ZANTAC) 150 MG tablet Take 1 tablet (150 mg total) by mouth at bedtime. 90 tablet 3  . Vitamin D, Ergocalciferol, (DRISDOL) 1.25 MG (50000 UT) CAPS capsule TAKE 1 CAPSULE (50,000 UNITS TOTAL) BY MOUTH EVERY 7 (SEVEN) DAYS. 2 capsule 0   No current facility-administered  medications on file prior to visit.     PAST MEDICAL HISTORY: Past Medical History:  Diagnosis Date  . Anemia   . Anxiety   . Arthritis    inflammitory  . Asthma   . B12 deficiency   . Back pain   . Chronic joint pain   . Constipation   . DDD (degenerative disc disease), lumbar    low back and neck and shoulders  . Depression    during divorce & legal matters  . Fatty liver   . Fibromyalgia   . Gallbladder problem   . GERD (gastroesophageal reflux disease)   . History of kidney stones    Dr Phebe Colla  . History of polycystic ovarian disease S/P BSO  . Hx of anxiety disorder   . Hypertension    a  . Hypothyroidism    no meds  . IBS (irritable bowel syndrome)   . IBS (irritable bowel syndrome)   . Infertility, female   . Joint pain   . Kidney problem   . Lactose intolerance   . Lupus (HCC) hx positive ANA  followed by Dr Gavin Pound; possible lupus  . Multiple food allergies   . Myalgia   . Osteoarthritis   . 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 POLYCYSTIC OVARIAN DISEASE 03/02/2007   Qualifier: Diagnosis of  By: Linna Darner MD, Rae Mar BSO for cysts & endometriosis; Dr Reino Kent, Gyn Seeing Dr Paula Compton    . PONV (postoperative nausea and vomiting)   . Prediabetes   . Sweating profusely   . Symptomatic mammary hypertrophy   . URI (upper respiratory infection)    currently on cefdinir    PAST SURGICAL HISTORY: Past Surgical History:  Procedure Laterality Date  . [place]   exploratory lap  . BREAST REDUCTION SURGERY Bilateral 03/26/2015   Procedure: BILATERAL BREAST REDUCTION ;  Surgeon: Wallace Going, DO;  Location: [place];  Service: Plastics;  Laterality: Bilateral;  . CARPAL TUNNEL RELEASE     bilateral; Dr Sherwood Gambler  . COLONOSCOPY     in 1990s  . CYSTO/ RIGHT RETROGRADE URETERAL PYELOGRAM  07-09-2004   HX BILATERAL RENAL STONES/ RIGHT FLANK PAIN  . CYSTOSCOPY W/ URETERAL STENT  PLACEMENT  11/12/2011   Procedure: CYSTOSCOPY WITH RETROGRADE PYELOGRAM/URETERAL STENT PLACEMENT;  Surgeon: Alexis Frock, MD;  Location: [facility];  Service: Urology;  Laterality: Right;  . CYSTOSCOPY W/ URETERAL STENT REMOVAL  12/07/2011   Procedure: CYSTOSCOPY WITH STENT REMOVAL;  Surgeon: Alexis Frock, MD;  Location: [facility];  Service: Urology;  Laterality: Right;  . CYSTOSCOPY/RETROGRADE/URETEROSCOPY/STONE EXTRACTION WITH BASKET  12/07/2011   Procedure: CYSTOSCOPY/RETROGRADE/URETEROSCOPY/STONE EXTRACTION WITH BASKET;  Surgeon: Alexis Frock, MD;  Location: [facility];  Service: Urology;  Laterality: Right;  . DILATION AND CURETTAGE OF UTERUS     X5  . EXCISION LEFT BARTHOLIN GLAND  02-05-2002  . EYE SURGERY     Retinal tears surgery bilateral  . LAPAROSCOPIC ASSISTED VAGINAL HYSTERECTOMY  2000  . LAPAROSCOPIC CHOLECYSTECTOMY  05-31-2007  . LAPAROSCOPIC LASER ABLATION ENDOMETRIOSIS AND LEFT SALPINGO-OOPHORECTOMY  11-03-1999  . LAPAROSCOPY WITH RIGHT SALPINGO-OOPHECTOMY/ LYSIS ADHESIONS AND ABLATION ENDOMETRIOSIS  05-17-2001  . LIPOSUCTION Bilateral 03/26/2015   Procedure: WITH LIPOSUCTION;  Surgeon: Wallace Going, DO;  Location: [place];  Service: Plastics;  Laterality: Bilateral;  . LUMBAR LAMINECTOMY  2003   L4 - L5  . PLANTAR FASCIA SURGERY     right  . PLANTAR FASCIA SURGERY  02/2011   left  . RIGHT URETEROSCOPIC STONE EXTRACTION  08-04-2000  . TONSILLECTOMY    . URETERAL STENT PLACEMENT  11/12/11   Dr Tresa Moore    SOCIAL HISTORY: Social History   Tobacco Use  . Smoking status: Never Smoker  . Smokeless tobacco: Never Used  Substance Use Topics  . Alcohol use: Yes    Alcohol/week: 1.0 standard drinks    Types: 1 Shots of liquor per week    Comment:  very rarely  . Drug use: No    FAMILY HISTORY: Family History  Problem Relation Age of Onset  . Hypertension Mother   . Colon polyps Mother   . Atrial fibrillation  Mother        Dr Angelena Form  . Hyperlipidemia Mother   . Heart disease Mother   . Anxiety disorder Mother   . Sleep apnea Mother   . Obesity Mother   . Heart attack Father 109  . Diabetes Father   . Hypertension Father   . Hyperlipidemia Father   .  Heart disease Father   . Stroke Father   . Obesity Father   . Hypertension Sister   . Heart attack Paternal Grandmother 62  . Breast cancer Paternal Aunt   . Colon cancer Maternal Uncle   . Colon cancer Paternal Uncle   . Colon polyps Maternal Grandmother   . Stroke Maternal Grandmother 76  . Diabetes Paternal Grandfather   . Stroke Maternal Grandfather 37    ROS: Review of Systems  Constitutional: Positive for weight loss.  HENT:       Positive for dysgeusia.  Psychiatric/Behavioral:       Negative for daytime somnolence.    PHYSICAL EXAM: Blood pressure 132/83, pulse 76, height 5\' 8"  (1.727 m), weight 224 lb (101.6 kg), SpO2 99 %. Body mass index is 34.06 kg/m. Physical Exam Vitals signs reviewed.  Constitutional:      Appearance: Normal appearance. She is obese.  Cardiovascular:     Rate and Rhythm: Normal rate.  Pulmonary:     Effort: Pulmonary effort is normal.  Musculoskeletal: Normal range of motion.  Skin:    General: Skin is warm and dry.  Neurological:     Mental Status: She is alert and oriented to person, place, and time.  Psychiatric:        Mood and Affect: Mood normal.        Behavior: Behavior normal.     RECENT LABS AND TESTS: BMET    Component Value Date/Time   NA 142 01/09/2018 1338   K 4.5 01/09/2018 1338   CL 104 01/09/2018 1338   CO2 22 01/09/2018 1338   GLUCOSE 74 01/09/2018 1338   GLUCOSE 92 08/16/2016 1011   BUN 20 01/09/2018 1338   CREATININE 0.62 01/09/2018 1338   CALCIUM 9.4 01/09/2018 1338   GFRNONAA 107 01/09/2018 1338   GFRAA 123 01/09/2018 1338   Lab Results  Component Value Date   HGBA1C 5.4 01/09/2018   HGBA1C 5.8 (H) 07/04/2017   HGBA1C 5.6 02/06/2017   HGBA1C 5.6  09/28/2016   HGBA1C 5.9 08/16/2016   Lab Results  Component Value Date   INSULIN 15.3 01/09/2018   INSULIN 10.4 07/04/2017   INSULIN 28.1 (H) 02/06/2017   INSULIN 14.7 09/28/2016   CBC    Component Value Date/Time   WBC 7.6 01/09/2018 1338   WBC 6.6 08/16/2016 1011   RBC 4.69 01/09/2018 1338   RBC 4.68 08/16/2016 1011   HGB 13.1 01/09/2018 1338   HCT 39.5 01/09/2018 1338   PLT 235.0 08/16/2016 1011   MCV 84 01/09/2018 1338   MCH 27.9 01/09/2018 1338   MCH 27.0 04/19/2015 0650   MCHC 33.2 01/09/2018 1338   MCHC 33.4 08/16/2016 1011   RDW 14.7 01/09/2018 1338   LYMPHSABS 2.4 01/09/2018 1338   MONOABS 0.6 08/16/2016 1011   EOSABS 0.2 01/09/2018 1338   BASOSABS 0.1 01/09/2018 1338   Iron/TIBC/Ferritin/ %Sat No results found for: IRON, TIBC, FERRITIN, IRONPCTSAT Lipid Panel     Component Value Date/Time   CHOL 157 01/09/2018 1338   TRIG 84 01/09/2018 1338   HDL 58 01/09/2018 1338   CHOLHDL 3 08/16/2016 1011   VLDL 12.2 08/16/2016 1011   LDLCALC 82 01/09/2018 1338   Hepatic Function Panel     Component Value Date/Time   PROT 6.9 01/09/2018 1338   ALBUMIN 4.4 01/09/2018 1338   AST 18 01/09/2018 1338   ALT 21 01/09/2018 1338   ALKPHOS 88 01/09/2018 1338   BILITOT 0.3 01/09/2018 1338   BILIDIR  0.1 06/12/2013 1632   IBILI 0.2 06/11/2009 2309      Component Value Date/Time   TSH 1.840 01/09/2018 1338   TSH 2.100 07/04/2017 1216   TSH 1.54 08/16/2016 1011   Results for STUMP [place], Susanna E "KIM" (MRN [id number]) as of 04/16/2018 13:08  Ref. Range 01/09/2018 13:38  Vitamin D, 25-Hydroxy Latest Ref Range: 30.0 - 100.0 ng/mL 39.1    OBESITY BEHAVIORAL INTERVENTION VISIT  Today's visit was # 28   Starting weight: 241 lbs Starting date: 09/28/16 Today's weight : Weight: 224 lb (101.6 kg)  Today's date: 04/12/2018 Total lbs lost to date: 17 At least 15 minutes were spent on discussing the following behavioral intervention visit.  ASK: We discussed the  diagnosis of obesity with [place] today and Glennda agreed to give [place] permission to discuss obesity behavioral modification therapy today.  ASSESS: Antania has the diagnosis of obesity and her BMI today is 34.07. Terre is in the action stage of change.   ADVISE: Eiley was educated on the multiple health risks of obesity as well as the benefit of weight loss to improve her health. She was advised of the need for long term treatment and the importance of lifestyle modifications to improve her current health and to decrease her risk of future health problems.  AGREE: Multiple dietary modification options and treatment options were discussed and Ieasha agreed to follow the recommendations documented in the above note.  ARRANGE: Anaiah was educated on the importance of frequent visits to treat obesity as outlined per CMS and USPSTF guidelines and agreed to schedule her next follow up appointment today.  I, Marcille Blanco, am acting as transcriptionist for Starlyn Skeans, MD  I have reviewed the above documentation for accuracy and completeness, and I agree with the above. -Dennard Nip, MD

## 2018-05-01 ENCOUNTER — Telehealth: Payer: Self-pay

## 2018-05-01 MED ORDER — FAMOTIDINE 40 MG PO TABS: 40.0000 mg | ORAL_TABLET | Freq: Every day | ORAL | 1 refills | Status: DC

## 2018-05-01 NOTE — Telephone Encounter (Signed)
Error

## 2018-05-03 ENCOUNTER — Encounter (INDEPENDENT_AMBULATORY_CARE_PROVIDER_SITE_OTHER): Payer: Self-pay | Admitting: Family Medicine

## 2018-05-03 ENCOUNTER — Ambulatory Visit (INDEPENDENT_AMBULATORY_CARE_PROVIDER_SITE_OTHER): Payer: PPO | Admitting: Family Medicine

## 2018-05-03 VITALS — BP 118/77 | HR 73 | Temp 98.2°F | Ht 68.0 in | Wt 221.0 lb

## 2018-05-03 DIAGNOSIS — E8881 Metabolic syndrome: Secondary | ICD-10-CM | POA: Diagnosis not present

## 2018-05-03 DIAGNOSIS — F3289 Other specified depressive episodes: Secondary | ICD-10-CM

## 2018-05-03 DIAGNOSIS — R7303 Prediabetes: Secondary | ICD-10-CM | POA: Diagnosis not present

## 2018-05-03 DIAGNOSIS — E559 Vitamin D deficiency, unspecified: Secondary | ICD-10-CM | POA: Diagnosis not present

## 2018-05-03 DIAGNOSIS — E669 Obesity, unspecified: Secondary | ICD-10-CM | POA: Diagnosis not present

## 2018-05-03 DIAGNOSIS — Z6833 Body mass index (BMI) 33.0-33.9, adult: Secondary | ICD-10-CM

## 2018-05-03 MED ORDER — BUPROPION HCL ER (SR) 200 MG PO TB12: 200.0000 mg | ORAL_TABLET | Freq: Two times a day (BID) | ORAL | 0 refills | Status: DC

## 2018-05-03 MED ORDER — VITAMIN D (ERGOCALCIFEROL) 1.25 MG (50000 UNIT) PO CAPS: ORAL_CAPSULE | ORAL | 0 refills | Status: DC

## 2018-05-03 MED ORDER — INSULIN PEN NEEDLE 32G X 4 MM MISC: 1.0000 | Freq: Two times a day (BID) | 0 refills | Status: DC

## 2018-05-04 LAB — VITAMIN D 25 HYDROXY (VIT D DEFICIENCY, FRACTURES): Vit D, 25-Hydroxy: 44.1 ng/mL (ref 30.0–100.0)

## 2018-05-04 LAB — COMPREHENSIVE METABOLIC PANEL
ALT: 20 IU/L (ref 0–32)
AST: 23 IU/L (ref 0–40)
Albumin/Globulin Ratio: 1.8 (ref 1.2–2.2)
Albumin: 4.2 g/dL (ref 3.8–4.8)
Alkaline Phosphatase: 90 IU/L (ref 39–117)
BUN/Creatinine Ratio: 26 — ABNORMAL HIGH (ref 9–23)
BUN: 22 mg/dL (ref 6–24)
Bilirubin Total: <0.2 mg/dL (ref 0.0–1.2)
CO2: 22 mmol/L (ref 20–29)
CREATININE: 0.86 mg/dL (ref 0.57–1.00)
Calcium: 9.4 mg/dL (ref 8.7–10.2)
Chloride: 106 mmol/L (ref 96–106)
GFR calc Af Amer: 92 mL/min/{1.73_m2} (ref >59)
GFR calc non Af Amer: 80 mL/min/{1.73_m2} (ref >59)
Globulin, Total: 2.4 g/dL (ref 1.5–4.5)
Glucose: 76 mg/dL (ref 65–99)
Potassium: 4.1 mmol/L (ref 3.5–5.2)
SODIUM: 142 mmol/L (ref 134–144)
Total Protein: 6.6 g/dL (ref 6.0–8.5)

## 2018-05-04 LAB — HEMOGLOBIN A1C
Est. average glucose Bld gHb Est-mCnc: 108 mg/dL
HEMOGLOBIN A1C: 5.4 % (ref 4.8–5.6)

## 2018-05-04 LAB — INSULIN, RANDOM: INSULIN: 17.8 u[IU]/mL (ref 2.6–24.9)

## 2018-05-07 MED ORDER — LIRAGLUTIDE 18 MG/3ML ~~LOC~~ SOPN: 1.2000 mg | PEN_INJECTOR | SUBCUTANEOUS | 0 refills | Status: DC

## 2018-05-07 NOTE — Progress Notes (Signed)
Office: [phone]  /  Fax: [phone]   HPI:   Chief Complaint: OBESITY [name] is here to discuss her progress with her obesity treatment plan. She is keeping a food journal with 1500 to 1600 calories and 100+ grams of protein and is following her eating plan approximately 85 % of the time. She states she is exercising 0 minutes 0 times per week. [name] has done better with journaling and meeting her calorie and protein goals. Her hunger is controlled.  Her weight is 221 lb (100.2 kg) today and has had a weight loss of 3 pounds over a period of 3 weeks since her last visit. She has lost 20 lbs since starting treatment with [place].  Insulin Resistance [name] has a diagnosis of insulin resistance based on her elevated fasting insulin level >5. Although [name]'s blood glucose readings are still under good control, insulin resistance puts her at greater risk of metabolic syndrome and diabetes. She is stable on Victoza currently and continues to work on diet and exercise to decrease risk of diabetes. She denies nausea, vomiting, and hypoglycemia.  Depression with emotional eating behaviors [name]'s mood is stable and she is doing well with emotional eating and using food for comfort to the extent that it is negatively impacting her health. She often snacks when she is not hungry. [name] sometimes feels she is out of control and then feels guilty that she made poor food choices. She has been working on behavior modification techniques to help reduce her emotional eating and has been somewhat successful.  ASSESSMENT AND PLAN:  Insulin resistance - Plan: VITAMIN D 25 Hydroxy (Vit-D Deficiency, Fractures), Hemoglobin A1c, Insulin Pen Needle (BD PEN NEEDLE NANO 2ND GEN) 32G X 4 MM MISC  Vitamin D deficiency - Plan: Vitamin D, Ergocalciferol, (DRISDOL) 1.25 MG (50000 UT) CAPS capsule  Other depression - with emotional eating - Plan: buPROPion (WELLBUTRIN SR) 200 MG 12 hr tablet  Class 1  obesity with serious comorbidity and body mass index (BMI) of 33.0 to 33.9 in adult, unspecified obesity type  PLAN:  Insulin Resistance [name] will continue to work on weight loss, exercise, and decreasing simple carbohydrates in her diet to help decrease the risk of diabetes. She was informed that eating too many simple carbohydrates or too many calories at one sitting increases the likelihood of GI side effects. [name] agreed to continue Victoza 1.2mg  qd #2 pens with nano needles and prescription was written today. [name] agreed to follow up with [place] as directed to monitor her progress in 3 to 4 weeks.  Depression with Emotional Eating Behaviors We discussed behavior modification techniques today to help [name] deal with her emotional eating and depression. She has agreed to take Wellbutrin SR 200mg  BID # 60 with no refills and agreed to follow up as directed.  Obesity [name] is currently in the action stage of change. As such, her goal is to continue with weight loss efforts. She has agreed to keep a food journal with 1500 to 1600 calories and 100+ grams of protein daily. [name] has been instructed to work up to a goal of 150 minutes of combined cardio and strengthening exercise per week for weight loss and overall health benefits.  [name] has agreed to follow up with our clinic in 3 to 4 weeks. She was informed of the importance of frequent follow up visits to maximize her success with intensive lifestyle modifications for her multiple health conditions.  ALLERGIES: Allergies  Allergen Reactions  . Chloraprep One Step [  Chlorhexidine Gluconate] Rash    Developed severe rash where chloraprep was used on chest area  . Ivp Dye [Iodinated Diagnostic Agents] Rash and Other (See Comments)    Flushing, minor facial rash & dyspnea  . Nalbuphine Shortness Of Breath and Rash    Nubain caused respiratory distress & rash  . Septra [Sulfamethoxazole-Trimethoprim] Shortness Of Breath  and Rash  . Sulfamethoxazole-Trimethoprim Rash    rash  . Tylox [Oxycodone-Acetaminophen] Rash  . Augmentin [Amoxicillin-Pot Clavulanate] Nausea And Vomiting    MEDICATIONS: Current Outpatient Medications on File Prior to Visit  Medication Sig Dispense Refill  . albuterol (PROVENTIL HFA;VENTOLIN HFA) 108 (90 Base) MCG/ACT inhaler Inhale 2 puffs into the lungs every 6 (six) hours as needed for wheezing or shortness of breath. 1 Inhaler 0  . aspirin 81 MG tablet Take 81 mg by mouth daily.     . citalopram (CELEXA) 20 MG tablet TAKE 1 TABLET BY MOUTH EVERY DAY 90 tablet 1  . clobetasol cream (TEMOVATE) 6.37 % Apply 1 application topically 2 (two) times daily. 30 g 0  . famotidine (PEPCID) 40 MG tablet Take 1 tablet (40 mg total) by mouth daily. 90 tablet 1  . Fluticasone-Salmeterol (ADVAIR DISKUS) 100-50 MCG/DOSE AEPB Inhale 1 puff into the lungs 2 (two) times daily. 60 each 5  . hydrochlorothiazide (MICROZIDE) 12.5 MG capsule TAKE 1 CAPSULE BY MOUTH EVERY DAY 90 capsule 2  . liraglutide (VICTOZA) 18 MG/3ML SOPN Inject 0.2 mLs (1.2 mg total) into the skin every morning. 2 pen 0  . lisinopril (PRINIVIL,ZESTRIL) 40 MG tablet TAKE 1 TABLET BY MOUTH EVERY DAY 90 tablet 1  . metFORMIN (GLUCOPHAGE) 500 MG tablet Take 1 tablet (500 mg total) by mouth 2 (two) times daily. 60 tablet 0  . montelukast (SINGULAIR) 10 MG tablet Take 1 tablet (10 mg total) by mouth at bedtime. -- Office visit needed for further refills 90 tablet 0  . polyethylene glycol powder (GLYCOLAX/MIRALAX) powder Take 17 g by mouth daily. 3350 g 0  . ranitidine (ZANTAC) 150 MG tablet Take 1 tablet (150 mg total) by mouth at bedtime. 90 tablet 3  . topiramate (TOPAMAX) 50 MG tablet Take 1 tablet (50 mg total) by mouth daily. 30 tablet 0   No current facility-administered medications on file prior to visit.     PAST MEDICAL HISTORY: Past Medical History:  Diagnosis Date  . Anemia   . Anxiety   . Arthritis    inflammitory  .  Asthma   . B12 deficiency   . Back pain   . Chronic joint pain   . Constipation   . DDD (degenerative disc disease), lumbar    low back and neck and shoulders  . Depression    during divorce & legal matters  . Fatty liver   . Fibromyalgia   . Gallbladder problem   . GERD (gastroesophageal reflux disease)   . History of kidney stones    Dr Phebe Colla  . History of polycystic ovarian disease S/P BSO  . Hx of anxiety disorder   . Hypertension    a  . Hypothyroidism    no meds  . IBS (irritable bowel syndrome)   . IBS (irritable bowel syndrome)   . Infertility, female   . Joint pain   . Kidney problem   . Lactose intolerance   . Lupus (HCC) hx positive ANA   followed by Dr Gavin Pound; possible lupus  . Multiple food allergies   . Myalgia   .  Osteoarthritis   . 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 POLYCYSTIC OVARIAN DISEASE 03/02/2007   Qualifier: Diagnosis of  By: Linna Darner MD, Rae Mar BSO for cysts & endometriosis; Dr Reino Kent, Gyn Seeing Dr Paula Compton    . PONV (postoperative nausea and vomiting)   . Prediabetes   . Sweating profusely   . Symptomatic mammary hypertrophy   . URI (upper respiratory infection)    currently on cefdinir    PAST SURGICAL HISTORY: Past Surgical History:  Procedure Laterality Date  . [place]   exploratory lap  . BREAST REDUCTION SURGERY Bilateral 03/26/2015   Procedure: BILATERAL BREAST REDUCTION ;  Surgeon: Wallace Going, DO;  Location: [facility];  Service: Plastics;  Laterality: Bilateral;  . CARPAL TUNNEL RELEASE     bilateral; Dr Sherwood Gambler  . COLONOSCOPY     in 1990s  . CYSTO/ RIGHT RETROGRADE URETERAL PYELOGRAM  07-09-2004   HX BILATERAL RENAL STONES/ RIGHT FLANK PAIN  . CYSTOSCOPY W/ URETERAL STENT PLACEMENT  11/12/2011   Procedure: CYSTOSCOPY WITH RETROGRADE PYELOGRAM/URETERAL STENT PLACEMENT;  Surgeon: Alexis Frock, MD;  Location: [facility];  Service: Urology;   Laterality: Right;  . CYSTOSCOPY W/ URETERAL STENT REMOVAL  12/07/2011   Procedure: CYSTOSCOPY WITH STENT REMOVAL;  Surgeon: Alexis Frock, MD;  Location: [facility];  Service: Urology;  Laterality: Right;  . CYSTOSCOPY/RETROGRADE/URETEROSCOPY/STONE EXTRACTION WITH BASKET  12/07/2011   Procedure: CYSTOSCOPY/RETROGRADE/URETEROSCOPY/STONE EXTRACTION WITH BASKET;  Surgeon: Alexis Frock, MD;  Location: [facility];  Service: Urology;  Laterality: Right;  . DILATION AND CURETTAGE OF UTERUS     X5  . EXCISION LEFT BARTHOLIN GLAND  02-05-2002  . EYE SURGERY     Retinal tears surgery bilateral  . LAPAROSCOPIC ASSISTED VAGINAL HYSTERECTOMY  2000  . LAPAROSCOPIC CHOLECYSTECTOMY  05-31-2007  . LAPAROSCOPIC LASER ABLATION ENDOMETRIOSIS AND LEFT SALPINGO-OOPHORECTOMY  11-03-1999  . LAPAROSCOPY WITH RIGHT SALPINGO-OOPHECTOMY/ LYSIS ADHESIONS AND ABLATION ENDOMETRIOSIS  05-17-2001  . LIPOSUCTION Bilateral 03/26/2015   Procedure: WITH LIPOSUCTION;  Surgeon: Wallace Going, DO;  Location: [facility];  Service: Plastics;  Laterality: Bilateral;  . LUMBAR LAMINECTOMY  2003   L4 - L5  . PLANTAR FASCIA SURGERY     right  . PLANTAR FASCIA SURGERY  02/2011   left  . RIGHT URETEROSCOPIC STONE EXTRACTION  08-04-2000  . TONSILLECTOMY    . URETERAL STENT PLACEMENT  11/12/11   Dr Tresa Moore    SOCIAL HISTORY: Social History   Tobacco Use  . Smoking status: Never Smoker  . Smokeless tobacco: Never Used  Substance Use Topics  . Alcohol use: Yes    Alcohol/week: 1.0 standard drinks    Types: 1 Shots of liquor per week    Comment:  very rarely  . Drug use: No    FAMILY HISTORY: Family History  Problem Relation Age of Onset  . Hypertension Mother   . Colon polyps Mother   . Atrial fibrillation Mother        Dr Angelena Form  . Hyperlipidemia Mother   . Heart disease Mother   . Anxiety disorder Mother   . Sleep apnea Mother   . Obesity Mother   . Heart  attack Father 2  . Diabetes Father   . Hypertension Father   . Hyperlipidemia Father   . Heart disease Father   . Stroke Father   . Obesity Father   . Hypertension Sister   .  Heart attack Paternal Grandmother 34  . Breast cancer Paternal Aunt   . Colon cancer Maternal Uncle   . Colon cancer Paternal Uncle   . Colon polyps Maternal Grandmother   . Stroke Maternal Grandmother 76  . Diabetes Paternal Grandfather   . Stroke Maternal Grandfather 37    ROS: Review of Systems  Constitutional: Positive for weight loss.  Gastrointestinal: Negative for nausea and vomiting.  Endo/Heme/Allergies:       Negative for hypoglycemia.  Psychiatric/Behavioral: Positive for depression.   PHYSICAL EXAM: Blood pressure 118/77, pulse 73, temperature 98.2 F (36.8 C), temperature source Oral, height 5\' 8"  (1.727 m), weight 221 lb (100.2 kg), SpO2 98 %. Body mass index is 33.6 kg/m. Physical Exam Vitals signs reviewed.  Constitutional:      Appearance: Normal appearance. She is obese.  Cardiovascular:     Rate and Rhythm: Normal rate.  Pulmonary:     Effort: Pulmonary effort is normal.  Musculoskeletal: Normal range of motion.  Skin:    General: Skin is warm and dry.  Neurological:     Mental Status: She is alert and oriented to person, place, and time.  Psychiatric:        Mood and Affect: Mood normal.        Behavior: Behavior normal.    RECENT LABS AND TESTS: BMET    Component Value Date/Time   NA 142 05/03/2018 1348   K 4.1 05/03/2018 1348   CL 106 05/03/2018 1348   CO2 22 05/03/2018 1348   GLUCOSE 76 05/03/2018 1348   GLUCOSE 92 08/16/2016 1011   BUN 22 05/03/2018 1348   CREATININE 0.86 05/03/2018 1348   CALCIUM 9.4 05/03/2018 1348   GFRNONAA 80 05/03/2018 1348   GFRAA 92 05/03/2018 1348   Lab Results  Component Value Date   HGBA1C 5.4 05/03/2018   HGBA1C 5.4 01/09/2018   HGBA1C 5.8 (H) 07/04/2017   HGBA1C 5.6 02/06/2017   HGBA1C 5.6 09/28/2016   Lab Results    Component Value Date   INSULIN 17.8 05/03/2018   INSULIN 15.3 01/09/2018   INSULIN 10.4 07/04/2017   INSULIN 28.1 (H) 02/06/2017   INSULIN 14.7 09/28/2016   CBC    Component Value Date/Time   WBC 7.6 01/09/2018 1338   WBC 6.6 08/16/2016 1011   RBC 4.69 01/09/2018 1338   RBC 4.68 08/16/2016 1011   HGB 13.1 01/09/2018 1338   HCT 39.5 01/09/2018 1338   PLT 235.0 08/16/2016 1011   MCV 84 01/09/2018 1338   MCH 27.9 01/09/2018 1338   MCH 27.0 04/19/2015 0650   MCHC 33.2 01/09/2018 1338   MCHC 33.4 08/16/2016 1011   RDW 14.7 01/09/2018 1338   LYMPHSABS 2.4 01/09/2018 1338   MONOABS 0.6 08/16/2016 1011   EOSABS 0.2 01/09/2018 1338   BASOSABS 0.1 01/09/2018 1338   Iron/TIBC/Ferritin/ %Sat No results found for: IRON, TIBC, FERRITIN, IRONPCTSAT Lipid Panel     Component Value Date/Time   CHOL 157 01/09/2018 1338   TRIG 84 01/09/2018 1338   HDL 58 01/09/2018 1338   CHOLHDL 3 08/16/2016 1011   VLDL 12.2 08/16/2016 1011   LDLCALC 82 01/09/2018 1338   Hepatic Function Panel     Component Value Date/Time   PROT 6.6 05/03/2018 1348   ALBUMIN 4.2 05/03/2018 1348   AST 23 05/03/2018 1348   ALT 20 05/03/2018 1348   ALKPHOS 90 05/03/2018 1348   BILITOT <0.2 05/03/2018 1348   BILIDIR 0.1 06/12/2013 1632   IBILI 0.2 06/11/2009 2309  Component Value Date/Time   TSH 1.840 01/09/2018 1338   TSH 2.100 07/04/2017 1216   TSH 1.54 08/16/2016 1011   Results for STUMP Brant, Sybella E "KIM" (MRN [id number]) as of 05/07/2018 05:52  Ref. Range 05/03/2018 13:48  Vitamin D, 25-Hydroxy Latest Ref Range: 30.0 - 100.0 ng/mL 44.1   OBESITY BEHAVIORAL INTERVENTION VISIT  Today's visit was # 29   Starting weight: 241 lbs Starting date: 09/28/16 Today's weight : Weight: 221 lb (100.2 kg)  Today's date: 05/03/2018 Total lbs lost to date: 20 At least 15 minutes were spent on discussing the following behavioral intervention visit.  ASK: We discussed the diagnosis of obesity with [facility] today and Alize agreed to give [place] permission to discuss obesity behavioral modification therapy today.  ASSESS: Vietta has the diagnosis of obesity and her BMI today is 33.6. Jesilyn is in the action stage of change.   ADVISE: Jerrianne was educated on the multiple health risks of obesity as well as the benefit of weight loss to improve her health. She was advised of the need for long term treatment and the importance of lifestyle modifications to improve her current health and to decrease her risk of future health problems.  AGREE: Multiple dietary modification options and treatment options were discussed and Oliviya agreed to follow the recommendations documented in the above note.  ARRANGE: Kera was educated on the importance of frequent visits to treat obesity as outlined per CMS and USPSTF guidelines and agreed to schedule her next follow up appointment today.  IMarcille Blanco, CMA, am acting as transcriptionist for Starlyn Skeans, MD  I have reviewed the above documentation for accuracy and completeness, and I agree with the above. -Dennard Nip, MD

## 2018-05-10 ENCOUNTER — Other Ambulatory Visit (INDEPENDENT_AMBULATORY_CARE_PROVIDER_SITE_OTHER): Payer: Self-pay | Admitting: Family Medicine

## 2018-05-10 DIAGNOSIS — F418 Other specified anxiety disorders: Secondary | ICD-10-CM

## 2018-05-10 DIAGNOSIS — R7303 Prediabetes: Secondary | ICD-10-CM

## 2018-05-11 ENCOUNTER — Other Ambulatory Visit (INDEPENDENT_AMBULATORY_CARE_PROVIDER_SITE_OTHER): Payer: Self-pay | Admitting: Family Medicine

## 2018-05-11 DIAGNOSIS — F418 Other specified anxiety disorders: Secondary | ICD-10-CM

## 2018-05-11 DIAGNOSIS — R7303 Prediabetes: Secondary | ICD-10-CM

## 2018-05-14 DIAGNOSIS — Z6834 Body mass index (BMI) 34.0-34.9, adult: Secondary | ICD-10-CM | POA: Diagnosis not present

## 2018-05-14 DIAGNOSIS — Z01419 Encounter for gynecological examination (general) (routine) without abnormal findings: Secondary | ICD-10-CM | POA: Diagnosis not present

## 2018-05-14 DIAGNOSIS — N952 Postmenopausal atrophic vaginitis: Secondary | ICD-10-CM | POA: Diagnosis not present

## 2018-05-14 DIAGNOSIS — Z124 Encounter for screening for malignant neoplasm of cervix: Secondary | ICD-10-CM | POA: Diagnosis not present

## 2018-05-14 DIAGNOSIS — R61 Generalized hyperhidrosis: Secondary | ICD-10-CM | POA: Diagnosis not present

## 2018-05-14 DIAGNOSIS — Z1389 Encounter for screening for other disorder: Secondary | ICD-10-CM | POA: Diagnosis not present

## 2018-05-14 DIAGNOSIS — N9089 Other specified noninflammatory disorders of vulva and perineum: Secondary | ICD-10-CM | POA: Diagnosis not present

## 2018-05-14 DIAGNOSIS — Z130 Encounter for screening for diseases of the blood and blood-forming organs and certain disorders involving the immune mechanism: Secondary | ICD-10-CM | POA: Diagnosis not present

## 2018-05-14 DIAGNOSIS — Z1231 Encounter for screening mammogram for malignant neoplasm of breast: Secondary | ICD-10-CM | POA: Diagnosis not present

## 2018-05-14 DIAGNOSIS — F520 Hypoactive sexual desire disorder: Secondary | ICD-10-CM | POA: Diagnosis not present

## 2018-05-17 ENCOUNTER — Encounter: Payer: Self-pay | Admitting: Internal Medicine

## 2018-05-17 ENCOUNTER — Other Ambulatory Visit: Payer: Self-pay

## 2018-05-17 MED ORDER — FAMOTIDINE 40 MG PO TABS: 40.0000 mg | ORAL_TABLET | Freq: Every day | ORAL | 1 refills | Status: DC

## 2018-05-21 ENCOUNTER — Other Ambulatory Visit: Payer: Self-pay | Admitting: Obstetrics and Gynecology

## 2018-05-21 DIAGNOSIS — N6320 Unspecified lump in the left breast, unspecified quadrant: Secondary | ICD-10-CM

## 2018-05-23 ENCOUNTER — Encounter: Payer: Self-pay | Admitting: Internal Medicine

## 2018-05-23 ENCOUNTER — Ambulatory Visit: Payer: Self-pay

## 2018-05-23 ENCOUNTER — Ambulatory Visit
Admission: RE | Admit: 2018-05-23 | Discharge: 2018-05-23 | Disposition: A | Discharge status: Home / Self Care | Payer: PPO | Source: Ambulatory Visit | Attending: Obstetrics and Gynecology | Admitting: Obstetrics and Gynecology

## 2018-05-23 ENCOUNTER — Other Ambulatory Visit: Payer: Self-pay

## 2018-05-23 DIAGNOSIS — N6320 Unspecified lump in the left breast, unspecified quadrant: Secondary | ICD-10-CM

## 2018-05-23 DIAGNOSIS — R921 Mammographic calcification found on diagnostic imaging of breast: Secondary | ICD-10-CM | POA: Diagnosis not present

## 2018-05-23 MED ORDER — FAMOTIDINE 40 MG PO TABS: 40.0000 mg | ORAL_TABLET | Freq: Every day | ORAL | 1 refills | Status: DC

## 2018-05-24 ENCOUNTER — Other Ambulatory Visit: Payer: Self-pay | Admitting: Obstetrics and Gynecology

## 2018-05-24 DIAGNOSIS — R928 Other abnormal and inconclusive findings on diagnostic imaging of breast: Secondary | ICD-10-CM

## 2018-05-31 ENCOUNTER — Ambulatory Visit (INDEPENDENT_AMBULATORY_CARE_PROVIDER_SITE_OTHER): Payer: PPO | Admitting: Family Medicine

## 2018-06-01 ENCOUNTER — Other Ambulatory Visit: Payer: Self-pay | Admitting: Internal Medicine

## 2018-06-03 ENCOUNTER — Other Ambulatory Visit: Payer: Self-pay | Admitting: Internal Medicine

## 2018-06-04 NOTE — Telephone Encounter (Signed)
I do not see this on patients list. Please advise.

## 2018-06-05 DIAGNOSIS — M7521 Bicipital tendinitis, right shoulder: Secondary | ICD-10-CM | POA: Diagnosis not present

## 2018-06-05 DIAGNOSIS — M533 Sacrococcygeal disorders, not elsewhere classified: Secondary | ICD-10-CM | POA: Diagnosis not present

## 2018-06-10 NOTE — Progress Notes (Signed)
Subjective:    Patient ID: [name], female    DOB: Oct 13, 1970, 48 y.o.   MRN: [id number]  HPI She is here for an acute visit for cold symptoms.  Her symptoms started one week ago.  Her mother was recently sick with bronchitis and she thinks that is where she got from.  She is experiencing a dry cough, wheezing, chest pain related to coughing, headaches, sinus pressure, ear pain, nasal congestion with clear mucus and she did have a low-grade fever 1 day last week.  She denies any shortness of breath, diarrhea, nausea, body aches and lightheadedness.  She has taken coricidin, Advair twice a day, albuterol, dayquil, tylenol    Medications and allergies reviewed with patient and updated if appropriate.  Patient Active Problem List   Diagnosis Date Noted  . Acute non-recurrent maxillary sinusitis 01/22/2018  . Dry cough 11/21/2017  . Rash and nonspecific skin eruption 11/21/2017  . Anxiety 08/17/2017  . 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 Other insomnia 06/06/2017  . Depression 05/16/2017  . Umbilical hernia 35/45/6256  . Diverticulosis of colon 02/26/2017  . Hematuria 02/16/2017  . Abdominal pain 02/16/2017  . Other hyperlipidemia 02/06/2017  . Asthma exacerbation, mild 11/24/2016  . Vitamin D deficiency 11/09/2016  . Class 2 obesity with serious comorbidity and body mass index (BMI) of 35.0 to 35.9 in adult 11/09/2016  . Right shoulder pain 10/11/2016  . AC (acromioclavicular) joint arthritis 09/13/2016  . Postsurgical menopause 08/16/2016  . Mucoid cyst of joint 11/04/2015  . External hemorrhoid 08/20/2015  . Prediabetes 06/05/2015  . GERD (gastroesophageal reflux disease) 06/04/2015  . Status post bilateral breast reduction 04/01/2015  . Cervical disc disorder with radiculopathy of cervical region 05/23/2014  . Neck pain 02/03/2014  . Ulnar neuropathy 01/16/2014  . Fibromyalgia 01/16/2014  . Recurrent nephrolithiasis 04/29/2013  . Arthralgia 04/29/2013  . Panic  attacks 01/15/2013  . IBS (irritable bowel syndrome) 12/14/2011  . B12 deficiency 06/30/2009  . Hypothyroidism 03/02/2007  . Essential hypertension 03/02/2007    Current Outpatient Medications on File Prior to Visit  Medication Sig Dispense Refill  . aspirin 81 MG tablet Take 81 mg by mouth daily.     [name] buPROPion (WELLBUTRIN SR) 200 MG 12 hr tablet Take 1 tablet (200 mg total) by mouth 2 (two) times daily. 60 tablet 0  . citalopram (CELEXA) 20 MG tablet TAKE 1 TABLET BY MOUTH EVERY DAY 90 tablet 1  . clobetasol cream (TEMOVATE) 3.89 % Apply 1 application topically 2 (two) times daily. 30 g 0  . famotidine (PEPCID) 40 MG tablet Take 1 tablet (40 mg total) by mouth daily. 90 tablet 1  . Fluticasone-Salmeterol (ADVAIR DISKUS) 100-50 MCG/DOSE AEPB Inhale 1 puff into the lungs 2 (two) times daily. 60 each 5  . hydrochlorothiazide (MICROZIDE) 12.5 MG capsule TAKE 1 CAPSULE BY MOUTH EVERY DAY 90 capsule 0  . Insulin Pen Needle (BD PEN NEEDLE NANO 2ND GEN) 32G X 4 MM MISC 1 Package by Does not apply route 2 (two) times daily. 100 each 0  . liraglutide (VICTOZA) 18 MG/3ML SOPN Inject 0.2 mLs (1.2 mg total) into the skin every morning. 2 pen 0  . lisinopril (PRINIVIL,ZESTRIL) 40 MG tablet TAKE 1 TABLET BY MOUTH EVERY DAY 90 tablet 1  . metFORMIN (GLUCOPHAGE) 500 MG tablet Take 1 tablet (500 mg total) by mouth 2 (two) times daily. 60 tablet 0  . montelukast (SINGULAIR) 10 MG tablet TAKE 1 TABLET (10 MG TOTAL) BY MOUTH AT BEDTIME. --  OFFICE VISIT NEEDED FOR FURTHER REFILLS 90 tablet 0  . polyethylene glycol powder (GLYCOLAX/MIRALAX) powder Take 17 g by mouth daily. 3350 g 0  . PROAIR HFA 108 (90 Base) MCG/ACT inhaler TAKE 2 PUFFS BY MOUTH EVERY 6 HOURS AS NEEDED FOR WHEEZE OR SHORTNESS OF BREATH 8.5 Inhaler 5  . topiramate (TOPAMAX) 50 MG tablet Take 1 tablet (50 mg total) by mouth daily. 30 tablet 0  . Vitamin D, Ergocalciferol, (DRISDOL) 1.25 MG (50000 UT) CAPS capsule TAKE 1 CAPSULE (50,000 UNITS  TOTAL) BY MOUTH EVERY 7 (SEVEN) DAYS. 4 capsule 0   No current facility-administered medications on file prior to visit.     Past Medical History:  Diagnosis Date  . Anemia   . Anxiety   . Arthritis    inflammitory  . Asthma   . B12 deficiency   . Back pain   . Chronic joint pain   . Constipation   . DDD (degenerative disc disease), lumbar    low back and neck and shoulders  . Depression    during divorce & legal matters  . Fatty liver   . Fibromyalgia   . Gallbladder problem   . GERD (gastroesophageal reflux disease)   . History of kidney stones    Dr Phebe Colla  . History of polycystic ovarian disease S/P BSO  . Hx of anxiety disorder   . Hypertension    a  . Hypothyroidism    no meds  . IBS (irritable bowel syndrome)   . IBS (irritable bowel syndrome)   . Infertility, female   . Joint pain   . Kidney problem   . Lactose intolerance   . Lupus (HCC) hx positive ANA   followed by Dr Gavin Pound; possible lupus  . Multiple food allergies   . Myalgia   . Osteoarthritis   . 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 POLYCYSTIC OVARIAN DISEASE 03/02/2007   Qualifier: Diagnosis of  By: Linna Darner MD, Rae Mar BSO for cysts & endometriosis; Dr Reino Kent, Gyn Seeing Dr Paula Compton    . PONV (postoperative nausea and vomiting)   . Prediabetes   . Sweating profusely   . Symptomatic mammary hypertrophy   . URI (upper respiratory infection)    currently on cefdinir    Past Surgical History:  Procedure Laterality Date  . [place]   exploratory lap  . BREAST REDUCTION SURGERY Bilateral 03/26/2015   Procedure: BILATERAL BREAST REDUCTION ;  Surgeon: Wallace Going, DO;  Location: [facility];  Service: Plastics;  Laterality: Bilateral;  . CARPAL TUNNEL RELEASE     bilateral; Dr Sherwood Gambler  . COLONOSCOPY     in 1990s  . CYSTO/ RIGHT RETROGRADE URETERAL PYELOGRAM  07-09-2004   HX BILATERAL RENAL STONES/ RIGHT FLANK  PAIN  . CYSTOSCOPY W/ URETERAL STENT PLACEMENT  11/12/2011   Procedure: CYSTOSCOPY WITH RETROGRADE PYELOGRAM/URETERAL STENT PLACEMENT;  Surgeon: Alexis Frock, MD;  Location: [facility];  Service: Urology;  Laterality: Right;  . CYSTOSCOPY W/ URETERAL STENT REMOVAL  12/07/2011   Procedure: CYSTOSCOPY WITH STENT REMOVAL;  Surgeon: Alexis Frock, MD;  Location: [facility];  Service: Urology;  Laterality: Right;  . CYSTOSCOPY/RETROGRADE/URETEROSCOPY/STONE EXTRACTION WITH BASKET  12/07/2011   Procedure: CYSTOSCOPY/RETROGRADE/URETEROSCOPY/STONE EXTRACTION WITH BASKET;  Surgeon: Alexis Frock, MD;  Location: [facility];  Service: Urology;  Laterality: Right;  . DILATION AND CURETTAGE OF UTERUS     X5  .  EXCISION LEFT BARTHOLIN GLAND  02-05-2002  . EYE SURGERY     Retinal tears surgery bilateral  . LAPAROSCOPIC ASSISTED VAGINAL HYSTERECTOMY  2000  . LAPAROSCOPIC CHOLECYSTECTOMY  05-31-2007  . LAPAROSCOPIC LASER ABLATION ENDOMETRIOSIS AND LEFT SALPINGO-OOPHORECTOMY  11-03-1999  . LAPAROSCOPY WITH RIGHT SALPINGO-OOPHECTOMY/ LYSIS ADHESIONS AND ABLATION ENDOMETRIOSIS  05-17-2001  . LIPOSUCTION Bilateral 03/26/2015   Procedure: WITH LIPOSUCTION;  Surgeon: Wallace Going, DO;  Location: [facility];  Service: Plastics;  Laterality: Bilateral;  . LUMBAR LAMINECTOMY  2003   L4 - L5  . PLANTAR FASCIA SURGERY     right  . PLANTAR FASCIA SURGERY  02/2011   left  . RIGHT URETEROSCOPIC STONE EXTRACTION  08-04-2000  . TONSILLECTOMY    . URETERAL STENT PLACEMENT  11/12/11   Dr Tresa Moore    Social History   Socioeconomic History  . Marital status: Married    Spouse name: Darnelle Maffucci  . Number of children: 0  . Years of education: college  . Highest education level: Not on file  Occupational History  . Occupation: disabled  Social Needs  . Financial resource strain: Not on file  . Food insecurity:    Worry: Not on file    Inability: Not on file  .  Transportation needs:    Medical: Not on file    Non-medical: Not on file  Tobacco Use  . Smoking status: Never Smoker  . Smokeless tobacco: Never Used  Substance and Sexual Activity  . Alcohol use: Yes    Alcohol/week: 1.0 standard drinks    Types: 1 Shots of liquor per week    Comment:  very rarely  . Drug use: No  . Sexual activity: Yes    Birth control/protection: Surgical  Lifestyle  . Physical activity:    Days per week: Not on file    Minutes per session: Not on file  . Stress: Not on file  Relationships  . Social connections:    Talks on phone: Not on file    Gets together: Not on file    Attends religious service: Not on file    Active member of club or organization: Not on file    Attends meetings of clubs or organizations: Not on file    Relationship status: Not on file  Other Topics Concern  . Not on file  Social History Narrative   Patient Lives at home with her husband Darnelle Maffucci)   Disabled.   Education two years of college.   Right handed.   Caffeine coffee and sweet tea. Not daily.    Family History  Problem Relation Age of Onset  . Hypertension Mother   . Colon polyps Mother   . Atrial fibrillation Mother        Dr Angelena Form  . Hyperlipidemia Mother   . Heart disease Mother   . Anxiety disorder Mother   . Sleep apnea Mother   . Obesity Mother   . Heart attack Father 34  . Diabetes Father   . Hypertension Father   . Hyperlipidemia Father   . Heart disease Father   . Stroke Father   . Obesity Father   . Hypertension Sister   . Heart attack Paternal Grandmother 39  . Breast cancer Paternal Aunt   . Colon cancer Maternal Uncle   . Colon cancer Paternal Uncle   . Colon polyps Maternal Grandmother   . Stroke Maternal Grandmother 76  . Diabetes Paternal Grandfather   . Stroke Maternal Grandfather 37  Review of Systems  Constitutional: Positive for fever (low grade fever once). Negative for chills.  HENT: Positive for congestion (clear  mucus), ear pain and sinus pressure. Negative for sinus pain.   Respiratory: Positive for cough (dry ) and wheezing. Negative for shortness of breath.   Cardiovascular: Positive for chest pain (from cough).  Gastrointestinal: Negative for diarrhea and nausea.  Musculoskeletal: Negative for myalgias.  Neurological: Positive for headaches. Negative for light-headedness.       Objective:   Vitals:   06/11/18 0959  BP: 130/74  Pulse: 97  Resp: 16  Temp: 98.9 F (37.2 C)  SpO2: 99%   Filed Weights   06/11/18 0959  Weight: 227 lb (103 kg)   Body mass index is 34.52 kg/m.  Wt Readings from Last 3 Encounters:  06/11/18 227 lb (103 kg)  05/03/18 221 lb (100.2 kg)  04/12/18 224 lb (101.6 kg)     Physical Exam GENERAL APPEARANCE: Appears stated age, well appearing, NAD EYES: conjunctiva clear, no icterus HEENT: bilateral tympanic membranes and ear canals normal, oropharynx with mild erythema, no thyromegaly, trachea midline, no cervical or supraclavicular lymphadenopathy LUNGS: Clear to auscultation without wheeze or crackles, unlabored breathing, good air entry bilaterally CARDIOVASCULAR: Normal S1,S2 without murmurs, no edema SKIN: warm, dry        Assessment & Plan:   See Problem List for Assessment and Plan of chronic medical problems.

## 2018-06-11 ENCOUNTER — Encounter: Payer: Self-pay | Admitting: Internal Medicine

## 2018-06-11 ENCOUNTER — Ambulatory Visit (INDEPENDENT_AMBULATORY_CARE_PROVIDER_SITE_OTHER): Payer: PPO | Admitting: Internal Medicine

## 2018-06-11 VITALS — BP 130/74 | HR 97 | Temp 98.9°F | Resp 16 | Ht 68.0 in | Wt 227.0 lb

## 2018-06-11 DIAGNOSIS — J45901 Unspecified asthma with (acute) exacerbation: Secondary | ICD-10-CM

## 2018-06-11 DIAGNOSIS — J069 Acute upper respiratory infection, unspecified: Secondary | ICD-10-CM

## 2018-06-11 MED ORDER — HYDROCODONE-HOMATROPINE 5-1.5 MG/5ML PO SYRP: 5.0000 mL | ORAL_SOLUTION | Freq: Three times a day (TID) | ORAL | 0 refills | Status: DC | PRN

## 2018-06-11 MED ORDER — BENZONATATE 200 MG PO CAPS: 200.0000 mg | ORAL_CAPSULE | Freq: Three times a day (TID) | ORAL | 0 refills | Status: DC | PRN

## 2018-06-11 NOTE — Assessment & Plan Note (Signed)
URI likely viral in nature Mild asthma exacerbation At this point we will hold off on antibiotics Discussed symptomatic treatment-Tessalon Perles, Hycodan cough syrup and over-the-counter cold medications Continue inhalers Call if no improvement or if symptoms worsen

## 2018-06-11 NOTE — Patient Instructions (Addendum)
Use the cough pills as needed - these do not make you tired.   Use the cough syrup as needed - this may make you drowsy.   Continue the inhalers.  Rinse your mouth out after using the Advair.    If your symptoms do not improve or worsen let me know and we will try an antibiotic.

## 2018-06-11 NOTE — Telephone Encounter (Signed)
Resent the tessalon pearles Dr. Quay Burow can you res[facility] the cough syrup [facility] [place].Marland KitchenJohny [name]

## 2018-06-11 NOTE — Assessment & Plan Note (Signed)
Symptoms consistent with mild asthma exacerbation related to probable viral URI Continue Advair twice daily-advised rinsing mouth out after use Albuterol inhaler as needed Lungs are clear so we do not need steroids at this time Symptomatic treatment

## 2018-06-13 DIAGNOSIS — M533 Sacrococcygeal disorders, not elsewhere classified: Secondary | ICD-10-CM | POA: Diagnosis not present

## 2018-06-14 ENCOUNTER — Ambulatory Visit (INDEPENDENT_AMBULATORY_CARE_PROVIDER_SITE_OTHER): Payer: PPO | Admitting: Family Medicine

## 2018-06-14 ENCOUNTER — Other Ambulatory Visit: Payer: Self-pay

## 2018-06-14 ENCOUNTER — Encounter (INDEPENDENT_AMBULATORY_CARE_PROVIDER_SITE_OTHER): Payer: Self-pay | Admitting: Family Medicine

## 2018-06-14 VITALS — BP 121/77 | HR 70 | Temp 98.6°F | Ht 68.0 in | Wt 221.0 lb

## 2018-06-14 DIAGNOSIS — R7303 Prediabetes: Secondary | ICD-10-CM | POA: Diagnosis not present

## 2018-06-14 DIAGNOSIS — F3289 Other specified depressive episodes: Secondary | ICD-10-CM

## 2018-06-14 DIAGNOSIS — E669 Obesity, unspecified: Secondary | ICD-10-CM

## 2018-06-14 DIAGNOSIS — Z6833 Body mass index (BMI) 33.0-33.9, adult: Secondary | ICD-10-CM | POA: Diagnosis not present

## 2018-06-14 MED ORDER — TOPIRAMATE 50 MG PO TABS: 50.0000 mg | ORAL_TABLET | Freq: Every day | ORAL | 0 refills | Status: DC

## 2018-06-14 MED ORDER — METFORMIN HCL 500 MG PO TABS: 500.0000 mg | ORAL_TABLET | Freq: Two times a day (BID) | ORAL | 0 refills | Status: DC

## 2018-06-18 NOTE — Progress Notes (Signed)
Office: [phone]  /  Fax: [phone]   HPI:   Chief Complaint: OBESITY [name] is here to discuss her progress with her obesity treatment plan. She is keeping a food journal with 1500 to 1600 calories and 100+ grams of protein and is following her eating plan approximately 90 % of the time. She states she is exercising 0 minutes 0 times per week. [name] has done well with maintaining weight in the last month. Her mother is trying to lose weight as well so she is getting more support, but is still eating out.  Her weight is 221 lb (100.2 kg) today and has not lost weight since her last visit. She has lost 20 lbs since starting treatment with [place].  Pre-Diabetes [name] has a diagnosis of pre-diabetes based on her elevated Hgb A1c and was informed this puts her at greater risk of developing diabetes. Her A1c has improved with her diet and metformin. She is taking metformin currently and continues to work on diet and exercise to decrease risk of diabetes.   Depression with emotional eating behaviors [name]'s mood is stable on her medications, but she is not sure if the topiramate is helping. She is struggling with emotional eating and using food for comfort to the extent that it is negatively impacting her health. She often snacks when she is not hungry. [name] sometimes feels she is out of control and then feels guilty that she made poor food choices. She has been working on behavior modification techniques to help reduce her emotional eating and has been somewhat successful.   ASSESSMENT AND PLAN:  Prediabetes - Plan: metFORMIN (GLUCOPHAGE) 500 MG tablet  Other depression - with emotional eating - Plan: topiramate (TOPAMAX) 50 MG tablet  Class 1 obesity with serious comorbidity and body mass index (BMI) of 33.0 to 33.9 in adult, unspecified obesity type  PLAN:  Pre-Diabetes [name] will continue to work on weight loss, exercise, and decreasing simple carbohydrates in her  diet to help decrease the risk of diabetes. She was informed that eating too many simple carbohydrates or too many calories at one sitting increases the likelihood of GI side effects. [name] agreed to continue metformin 500 mg BID # 60 with no refills and a prescription was written today. [name] agreed to follow up with [place] as directed to monitor her progress in 3 weeks.  Depression with Emotional Eating Behaviors We discussed behavior modification techniques today to help [name] deal with her emotional eating and depression. She has agreed to continue Wellbutrin and to take topiramate 50 mg qd #30 with no refills. She is ok to discontinue topiramate if she wants and we will follow her closely. [name] agreed to follow up as directed.   Obesity [name] is currently in the action stage of change. As such, her goal is to continue with weight loss efforts. She has agreed to keep a food journal with 1500 to 1600 calories and 100 grams of protein.  [name] has been instructed to work up to a goal of 150 minutes of combined cardio and strengthening exercise per week for weight loss and overall health benefits. We discussed the following Behavioral Modification Strategies today: increasing lean protein intake, decreasing simple carbohydrates, and work on meal planning and easy cooking plans.  [name] has agreed to follow up with our clinic in 3 weeks. She was informed of the importance of frequent follow up visits to maximize her success with intensive lifestyle modifications for her multiple health conditions.  ALLERGIES: Allergies  Allergen Reactions  . Chloraprep One Step [Chlorhexidine Gluconate] Rash    Developed severe rash where chloraprep was used on chest area  . Ivp Dye [Iodinated Diagnostic Agents] Rash and Other (See Comments)    Flushing, minor facial rash & dyspnea  . Nalbuphine Shortness Of Breath and Rash    Nubain caused respiratory distress & rash  . Septra  [Sulfamethoxazole-Trimethoprim] Shortness Of Breath and Rash  . Sulfamethoxazole-Trimethoprim Rash    rash  . Tylox [Oxycodone-Acetaminophen] Rash  . Augmentin [Amoxicillin-Pot Clavulanate] Nausea And Vomiting    MEDICATIONS: Current Outpatient Medications on File Prior to Visit  Medication Sig Dispense Refill  . aspirin 81 MG tablet Take 81 mg by mouth daily.     . benzonatate (TESSALON) 200 MG capsule Take 1 capsule (200 mg total) by mouth 3 (three) times daily as needed for cough. 30 capsule 0  . buPROPion (WELLBUTRIN SR) 200 MG 12 hr tablet Take 1 tablet (200 mg total) by mouth 2 (two) times daily. 60 tablet 0  . citalopram (CELEXA) 20 MG tablet TAKE 1 TABLET BY MOUTH EVERY DAY 90 tablet 1  . clobetasol cream (TEMOVATE) 5.63 % Apply 1 application topically 2 (two) times daily. 30 g 0  . famotidine (PEPCID) 40 MG tablet Take 1 tablet (40 mg total) by mouth daily. 90 tablet 1  . Fluticasone-Salmeterol (ADVAIR DISKUS) 100-50 MCG/DOSE AEPB Inhale 1 puff into the lungs 2 (two) times daily. 60 each 5  . hydrochlorothiazide (MICROZIDE) 12.5 MG capsule TAKE 1 CAPSULE BY MOUTH EVERY DAY 90 capsule 0  . HYDROcodone-homatropine (HYCODAN) 5-1.5 MG/5ML syrup Take 5 mLs by mouth every 8 (eight) hours as needed for cough. 120 mL 0  . Insulin Pen Needle (BD PEN NEEDLE NANO 2ND GEN) 32G X 4 MM MISC 1 Package by Does not apply route 2 (two) times daily. 100 each 0  . liraglutide (VICTOZA) 18 MG/3ML SOPN Inject 0.2 mLs (1.2 mg total) into the skin every morning. 2 pen 0  . lisinopril (PRINIVIL,ZESTRIL) 40 MG tablet TAKE 1 TABLET BY MOUTH EVERY DAY 90 tablet 1  . montelukast (SINGULAIR) 10 MG tablet TAKE 1 TABLET (10 MG TOTAL) BY MOUTH AT BEDTIME. -- OFFICE VISIT NEEDED FOR FURTHER REFILLS 90 tablet 0  . polyethylene glycol powder (GLYCOLAX/MIRALAX) powder Take 17 g by mouth daily. 3350 g 0  . PROAIR HFA 108 (90 Base) MCG/ACT inhaler TAKE 2 PUFFS BY MOUTH EVERY 6 HOURS AS NEEDED FOR WHEEZE OR SHORTNESS OF  BREATH 8.5 Inhaler 5  . Vitamin D, Ergocalciferol, (DRISDOL) 1.25 MG (50000 UT) CAPS capsule TAKE 1 CAPSULE (50,000 UNITS TOTAL) BY MOUTH EVERY 7 (SEVEN) DAYS. 4 capsule 0   No current facility-administered medications on file prior to visit.     PAST MEDICAL HISTORY: Past Medical History:  Diagnosis Date  . Anemia   . Anxiety   . Arthritis    inflammitory  . Asthma   . B12 deficiency   . Back pain   . Chronic joint pain   . Constipation   . DDD (degenerative disc disease), lumbar    low back and neck and shoulders  . Depression    during divorce & legal matters  . Fatty liver   . Fibromyalgia   . Gallbladder problem   . GERD (gastroesophageal reflux disease)   . History of kidney stones    Dr Phebe Colla  . History of polycystic ovarian disease S/P BSO  . Hx of anxiety disorder   . Hypertension  a  . Hypothyroidism    no meds  . IBS (irritable bowel syndrome)   . IBS (irritable bowel syndrome)   . Infertility, female   . Joint pain   . Kidney problem   . Lactose intolerance   . Lupus (HCC) hx positive ANA   followed by Dr Gavin Pound; possible lupus  . Multiple food allergies   . Myalgia   . Osteoarthritis   . 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 POLYCYSTIC OVARIAN DISEASE 03/02/2007   Qualifier: Diagnosis of  By: Linna Darner MD, Rae Mar BSO for cysts & endometriosis; Dr Reino Kent, Gyn Seeing Dr Paula Compton    . PONV (postoperative nausea and vomiting)   . Prediabetes   . Sweating profusely   . Symptomatic mammary hypertrophy   . URI (upper respiratory infection)    currently on cefdinir    PAST SURGICAL HISTORY: Past Surgical History:  Procedure Laterality Date  . [place]   exploratory lap  . BREAST REDUCTION SURGERY Bilateral 03/26/2015   Procedure: BILATERAL BREAST REDUCTION ;  Surgeon: Wallace Going, DO;  Location: [facility];  Service: Plastics;  Laterality: Bilateral;  . CARPAL TUNNEL  RELEASE     bilateral; Dr Sherwood Gambler  . COLONOSCOPY     in 1990s  . CYSTO/ RIGHT RETROGRADE URETERAL PYELOGRAM  07-09-2004   HX BILATERAL RENAL STONES/ RIGHT FLANK PAIN  . CYSTOSCOPY W/ URETERAL STENT PLACEMENT  11/12/2011   Procedure: CYSTOSCOPY WITH RETROGRADE PYELOGRAM/URETERAL STENT PLACEMENT;  Surgeon: Alexis Frock, MD;  Location: [facility];  Service: Urology;  Laterality: Right;  . CYSTOSCOPY W/ URETERAL STENT REMOVAL  12/07/2011   Procedure: CYSTOSCOPY WITH STENT REMOVAL;  Surgeon: Alexis Frock, MD;  Location[facility];  Service: Urology;  Laterality: Right;  . CYSTOSCOPY/RETROGRADE/URETEROSCOPY/STONE EXTRACTION WITH BASKET  12/07/2011   Procedure: CYSTOSCOPY/RETROGRADE/URETEROSCOPY/STONE EXTRACTION WITH BASKET;  Surgeon: Alexis Frock, MD;  Location: [facility];  Service: Urology;  Laterality: Right;  . DILATION AND CURETTAGE OF UTERUS     X5  . EXCISION LEFT BARTHOLIN GLAND  02-05-2002  . EYE SURGERY     Retinal tears surgery bilateral  . LAPAROSCOPIC ASSISTED VAGINAL HYSTERECTOMY  2000  . LAPAROSCOPIC CHOLECYSTECTOMY  05-31-2007  . LAPAROSCOPIC LASER ABLATION ENDOMETRIOSIS AND LEFT SALPINGO-OOPHORECTOMY  11-03-1999  . LAPAROSCOPY WITH RIGHT SALPINGO-OOPHECTOMY/ LYSIS ADHESIONS AND ABLATION ENDOMETRIOSIS  05-17-2001  . LIPOSUCTION Bilateral 03/26/2015   Procedure: WITH LIPOSUCTION;  Surgeon: Wallace Going, DO;  Location: [facility];  Service: Plastics;  Laterality: Bilateral;  . LUMBAR LAMINECTOMY  2003   L4 - L5  . PLANTAR FASCIA SURGERY     right  . PLANTAR FASCIA SURGERY  02/2011   left  . RIGHT URETEROSCOPIC STONE EXTRACTION  08-04-2000  . TONSILLECTOMY    . URETERAL STENT PLACEMENT  11/12/11   Dr Tresa Moore    SOCIAL HISTORY: Social History   Tobacco Use  . Smoking status: Never Smoker  . Smokeless tobacco: Never Used  Substance Use Topics  . Alcohol use: Yes    Alcohol/week: 1.0 standard drinks    Types: 1 Shots of  liquor per week    Comment:  very rarely  . Drug use: No    FAMILY HISTORY: Family History  Problem Relation Age of Onset  . Hypertension Mother   . Colon polyps Mother   . Atrial fibrillation Mother        Dr  McAlhany  . Hyperlipidemia Mother   . Heart disease Mother   . Anxiety disorder Mother   . Sleep apnea Mother   . Obesity Mother   . Heart attack Father 68  . Diabetes Father   . Hypertension Father   . Hyperlipidemia Father   . Heart disease Father   . Stroke Father   . Obesity Father   . Hypertension Sister   . Heart attack Paternal Grandmother 50  . Breast cancer Paternal Aunt   . Colon cancer Maternal Uncle   . Colon cancer Paternal Uncle   . Colon polyps Maternal Grandmother   . Stroke Maternal Grandmother 76  . Diabetes Paternal Grandfather   . Stroke Maternal Grandfather 37   ROS: Review of Systems  Constitutional: Negative for weight loss.  Psychiatric/Behavioral: Positive for depression.   PHYSICAL EXAM: Blood pressure 121/77, pulse 70, temperature 98.6 F (37 C), temperature source Oral, height 5\' 8"  (1.727 m), weight 221 lb (100.2 kg), SpO2 99 %. Body mass index is 33.6 kg/m. Physical Exam Vitals signs reviewed.  Constitutional:      Appearance: Normal appearance. She is obese.  Cardiovascular:     Rate and Rhythm: Normal rate.  Pulmonary:     Effort: Pulmonary effort is normal.  Musculoskeletal: Normal range of motion.  Skin:    General: Skin is warm and dry.  Neurological:     Mental Status: She is alert and oriented to person, place, and time.  Psychiatric:        Mood and Affect: Mood normal.        Behavior: Behavior normal.    RECENT LABS AND TESTS: BMET    Component Value Date/Time   NA 142 05/03/2018 1348   K 4.1 05/03/2018 1348   CL 106 05/03/2018 1348   CO2 22 05/03/2018 1348   GLUCOSE 76 05/03/2018 1348   GLUCOSE 92 08/16/2016 1011   BUN 22 05/03/2018 1348   CREATININE 0.86 05/03/2018 1348   CALCIUM 9.4 05/03/2018  1348   GFRNONAA 80 05/03/2018 1348   GFRAA 92 05/03/2018 1348   Lab Results  Component Value Date   HGBA1C 5.4 05/03/2018   HGBA1C 5.4 01/09/2018   HGBA1C 5.8 (H) 07/04/2017   HGBA1C 5.6 02/06/2017   HGBA1C 5.6 09/28/2016   Lab Results  Component Value Date   INSULIN 17.8 05/03/2018   INSULIN 15.3 01/09/2018   INSULIN 10.4 07/04/2017   INSULIN 28.1 (H) 02/06/2017   INSULIN 14.7 09/28/2016   CBC    Component Value Date/Time   WBC 7.6 01/09/2018 1338   WBC 6.6 08/16/2016 1011   RBC 4.69 01/09/2018 1338   RBC 4.68 08/16/2016 1011   HGB 13.1 01/09/2018 1338   HCT 39.5 01/09/2018 1338   PLT 235.0 08/16/2016 1011   MCV 84 01/09/2018 1338   MCH 27.9 01/09/2018 1338   MCH 27.0 04/19/2015 0650   MCHC 33.2 01/09/2018 1338   MCHC 33.4 08/16/2016 1011   RDW 14.7 01/09/2018 1338   LYMPHSABS 2.4 01/09/2018 1338   MONOABS 0.6 08/16/2016 1011   EOSABS 0.2 01/09/2018 1338   BASOSABS 0.1 01/09/2018 1338   Iron/TIBC/Ferritin/ %Sat No results found for: IRON, TIBC, FERRITIN, IRONPCTSAT Lipid Panel     Component Value Date/Time   CHOL 157 01/09/2018 1338   TRIG 84 01/09/2018 1338   HDL 58 01/09/2018 1338   CHOLHDL 3 08/16/2016 1011   VLDL 12.2 08/16/2016 1011   LDLCALC 82 01/09/2018 1338   Hepatic Function Panel  Component Value Date/Time   PROT 6.6 05/03/2018 1348   ALBUMIN 4.2 05/03/2018 1348   AST 23 05/03/2018 1348   ALT 20 05/03/2018 1348   ALKPHOS 90 05/03/2018 1348   BILITOT <0.2 05/03/2018 1348   BILIDIR 0.1 06/12/2013 1632   IBILI 0.2 06/11/2009 2309      Component Value Date/Time   TSH 1.840 01/09/2018 1338   TSH 2.100 07/04/2017 1216   TSH 1.54 08/16/2016 1011   Results for Morua, Collin E "KIM" (MRN [id number]) as of 06/18/2018 06:51  Ref. Range 05/03/2018 13:48  Vitamin D, 25-Hydroxy Latest Ref Range: 30.0 - 100.0 ng/mL 44.1    OBESITY BEHAVIORAL INTERVENTION VISIT  Today's visit was # 30   Starting weight: 241 lbs Starting date: 09/28/16  Today's weight : Weight: 221 lb (100.2 kg)  Today's date: 06/14/2018 Total lbs lost to date: 20 At least 15 minutes were spent on discussing the following behavioral intervention visit.    06/14/2018  Height 5\' 8"  (1.727 m)  Weight 221 lb (100.2 kg)  BMI (Calculated) 33.61  BLOOD PRESSURE - SYSTOLIC 401  BLOOD PRESSURE - DIASTOLIC 77   Body Fat % 02.7 %  Total Body Water (lbs) 86 lbs   ASK: We discussed the diagnosis of obesity with Karie Mainland today and Mariaguadalupe agreed to give [place] permission to discuss obesity behavioral modification therapy today.  ASSESS: [name] has the diagnosis of obesity and her BMI today is 33.61. Lamyiah is in the action stage of change.   ADVISE: [name] was educated on the multiple health risks of obesity as well as the benefit of weight loss to improve her health. She was advised of the need for long term treatment and the importance of lifestyle modifications to improve her current health and to decrease her risk of future health problems.  AGREE: Multiple dietary modification options and treatment options were discussed and [name] agreed to follow the recommendations documented in the above note.  ARRANGE: [name] was educated on the importance of frequent visits to treat obesity as outlined per CMS and USPSTF guidelines and agreed to schedule her next follow up appointment today.  IMarcille Blanco, CMA, am acting as transcriptionist for Starlyn Skeans, MD  I have reviewed the above documentation for accuracy and completeness, and I agree with the above. -Dennard Nip, MD

## 2018-06-20 DIAGNOSIS — M7521 Bicipital tendinitis, right shoulder: Secondary | ICD-10-CM | POA: Diagnosis not present

## 2018-06-27 ENCOUNTER — Encounter (INDEPENDENT_AMBULATORY_CARE_PROVIDER_SITE_OTHER): Payer: Self-pay

## 2018-07-02 ENCOUNTER — Encounter (INDEPENDENT_AMBULATORY_CARE_PROVIDER_SITE_OTHER): Payer: Self-pay

## 2018-07-09 ENCOUNTER — Encounter (INDEPENDENT_AMBULATORY_CARE_PROVIDER_SITE_OTHER): Payer: Self-pay | Admitting: Family Medicine

## 2018-07-09 ENCOUNTER — Other Ambulatory Visit: Payer: Self-pay

## 2018-07-09 ENCOUNTER — Ambulatory Visit (INDEPENDENT_AMBULATORY_CARE_PROVIDER_SITE_OTHER): Payer: PPO | Admitting: Family Medicine

## 2018-07-09 DIAGNOSIS — Z6833 Body mass index (BMI) 33.0-33.9, adult: Secondary | ICD-10-CM | POA: Diagnosis not present

## 2018-07-09 DIAGNOSIS — R7303 Prediabetes: Secondary | ICD-10-CM | POA: Diagnosis not present

## 2018-07-09 DIAGNOSIS — E559 Vitamin D deficiency, unspecified: Secondary | ICD-10-CM | POA: Diagnosis not present

## 2018-07-09 DIAGNOSIS — E669 Obesity, unspecified: Secondary | ICD-10-CM

## 2018-07-09 DIAGNOSIS — F418 Other specified anxiety disorders: Secondary | ICD-10-CM

## 2018-07-09 MED ORDER — TOPIRAMATE 50 MG PO TABS: 50.0000 mg | ORAL_TABLET | Freq: Every day | ORAL | 0 refills | Status: DC

## 2018-07-09 MED ORDER — VITAMIN D (ERGOCALCIFEROL) 1.25 MG (50000 UNIT) PO CAPS: ORAL_CAPSULE | ORAL | 0 refills | Status: DC

## 2018-07-09 MED ORDER — LIRAGLUTIDE 18 MG/3ML ~~LOC~~ SOPN: 1.2000 mg | PEN_INJECTOR | SUBCUTANEOUS | 0 refills | Status: DC

## 2018-07-09 MED ORDER — METFORMIN HCL 500 MG PO TABS: 500.0000 mg | ORAL_TABLET | Freq: Two times a day (BID) | ORAL | 0 refills | Status: DC

## 2018-07-09 NOTE — Progress Notes (Signed)
Office: [phone]  /  Fax: [phone] TeleHealth Visit:  ELLIEANNA FUNDERBURG has verbally consented to this TeleHealth visit today. The patient is located at home, the provider is located at the [employer] [facility] office. The participants in this visit include the listed provider and patient. The visit was conducted today via Face Time.  HPI:   Chief Complaint: OBESITY Rosealyn is here to discuss her progress with her obesity treatment plan. She is keeping a food journal with 1500 to 1600 calories and 100 grams of protein and is following her eating plan approximately 75 % of the time. She states she is doing lots of yard work. Myliyah has been more active and she is still journaling on and off. She thinks that she has lost another pound since her last visit. She is struggling to find healthy foods due to the grocery shortage.   We were unable to weigh the patient today for this TeleHealth visit. She feels as if she has lost weight since her last visit. She has lost 20 lbs since starting treatment with [place].  Vitamin D Deficiency Cori has a diagnosis of vitamin D deficiency. She is currently stable on vit D, but is not yet at goal. Analynn denies nausea, vomiting, or muscle weakness.  Pre-Diabetes Aryannah has a diagnosis of pre-diabetes based on her elevated Hgb A1c and was informed this puts her at greater risk of developing diabetes. She continues to do well with diet and medications. She has increased outdoor exercise and is feeling well overall. She is taking metformin currently and continues to work on diet and exercise to decrease risk of diabetes. She denies nausea, vomiting, or hypoglycemia.   Depression with Anxiety Deepa is doing well on topiramate, Celexa, and Wellbutrin. She is more in control of her emotional eating and using food for comfort to the extent that it is negatively impacting her health. She has increased outdoor exercise and is feeling well overall.  She has been working on behavior modification techniques to help reduce her emotional eating and has been somewhat successful. She shows no sign of seratonin syndrome. Shekina denies insomnia.  ASSESSMENT AND PLAN:  Vitamin D deficiency - Plan: Vitamin D, Ergocalciferol, (DRISDOL) 1.25 MG (50000 UT) CAPS capsule  Prediabetes - Plan: metFORMIN (GLUCOPHAGE) 500 MG tablet, liraglutide (VICTOZA) 18 MG/3ML SOPN  Depression with anxiety - Plan: topiramate (TOPAMAX) 50 MG tablet  Class 1 obesity with serious comorbidity and body mass index (BMI) of 33.0 to 33.9 in adult, unspecified obesity type  PLAN:  Vitamin D Deficiency Alica was informed that low vitamin D levels contribute to fatigue and are associated with obesity, breast, and colon cancer. Gelena agrees to continue to take prescription Vit D @50 ,000 IU every week #4 with no refills and will follow up for routine testing of vitamin D, at least 2-3 times per year. She was informed of the risk of over-replacement of vitamin D and agrees to not increase her dose unless she discusses this with [place] first. Irving agrees to follow up in 3 weeks as directed.  Pre-Diabetes Martyna will continue to work on weight loss, exercise, and decreasing simple carbohydrates in her diet to help decrease the risk of diabetes. She was informed that eating too many simple carbohydrates or too many calories at one sitting increases the likelihood of GI side effects. Asta agreed to continue metformin 500 mg BID #60 and Victoza 1.2 mg qd #2 pens with no refills and a prescription was written today. [name]  agreed to follow up with [place] as directed to monitor her progress in 3 weeks.   Depression with Anxiety We discussed behavior modification techniques today to help Kaylanie deal with her emotional eating and depression. She has agreed to take topamax 50 mg qd #30 with no refills and to continue his other medications. Joanmarie has agreed to follow up as  directed.  Obesity Vincentina is currently in the action stage of change. As such, her goal is to continue with weight loss efforts. She has agreed to keep a food journal with 1500 to 1600 calories and 100 grams of protein.  Mairany has been instructed to work up to a goal of 150 minutes of combined cardio and strengthening exercise per week for weight loss and overall health benefits. We discussed the following Behavioral Modification Strategies today: increasing lean protein intake, decreasing simple carbohydrates, work on meal planning and easy cooking plans, emotional eating strategies, better snacking choices, and ways to avoid boredom eating.  Presli has agreed to follow up with our clinic in 3 weeks. She was informed of the importance of frequent follow up visits to maximize her success with intensive lifestyle modifications for her multiple health conditions.  ALLERGIES: Allergies  Allergen Reactions  . Chloraprep One Step [Chlorhexidine Gluconate] Rash    Developed severe rash where chloraprep was used on chest area  . Ivp Dye [Iodinated Diagnostic Agents] Rash and Other (See Comments)    Flushing, minor facial rash & dyspnea  . Nalbuphine Shortness Of Breath and Rash    Nubain caused respiratory distress & rash  . Septra [Sulfamethoxazole-Trimethoprim] Shortness Of Breath and Rash  . Sulfamethoxazole-Trimethoprim Rash    rash  . Tylox [Oxycodone-Acetaminophen] Rash  . Augmentin [Amoxicillin-Pot Clavulanate] Nausea And Vomiting    MEDICATIONS: Current Outpatient Medications on File Prior to Visit  Medication Sig Dispense Refill  . aspirin 81 MG tablet Take 81 mg by mouth daily.     . benzonatate (TESSALON) 200 MG capsule Take 1 capsule (200 mg total) by mouth 3 (three) times daily as needed for cough. 30 capsule 0  . buPROPion (WELLBUTRIN SR) 200 MG 12 hr tablet Take 1 tablet (200 mg total) by mouth 2 (two) times daily. 60 tablet 0  . citalopram (CELEXA) 20 MG tablet TAKE 1  TABLET BY MOUTH EVERY DAY 90 tablet 1  . clobetasol cream (TEMOVATE) 1.61 % Apply 1 application topically 2 (two) times daily. 30 g 0  . famotidine (PEPCID) 40 MG tablet Take 1 tablet (40 mg total) by mouth daily. 90 tablet 1  . Fluticasone-Salmeterol (ADVAIR DISKUS) 100-50 MCG/DOSE AEPB Inhale 1 puff into the lungs 2 (two) times daily. 60 each 5  . hydrochlorothiazide (MICROZIDE) 12.5 MG capsule TAKE 1 CAPSULE BY MOUTH EVERY DAY 90 capsule 0  . HYDROcodone-homatropine (HYCODAN) 5-1.5 MG/5ML syrup Take 5 mLs by mouth every 8 (eight) hours as needed for cough. 120 mL 0  . Insulin Pen Needle (BD PEN NEEDLE NANO 2ND GEN) 32G X 4 MM MISC 1 Package by Does not apply route 2 (two) times daily. 100 each 0  . lisinopril (PRINIVIL,ZESTRIL) 40 MG tablet TAKE 1 TABLET BY MOUTH EVERY DAY 90 tablet 1  . montelukast (SINGULAIR) 10 MG tablet TAKE 1 TABLET (10 MG TOTAL) BY MOUTH AT BEDTIME. -- OFFICE VISIT NEEDED FOR FURTHER REFILLS 90 tablet 0  . polyethylene glycol powder (GLYCOLAX/MIRALAX) powder Take 17 g by mouth daily. 3350 g 0  . PROAIR HFA 108 (90 Base) MCG/ACT inhaler TAKE 2  PUFFS BY MOUTH EVERY 6 HOURS AS NEEDED FOR WHEEZE OR SHORTNESS OF BREATH 8.5 Inhaler 5   No current facility-administered medications on file prior to visit.     PAST MEDICAL HISTORY: Past Medical History:  Diagnosis Date  . Anemia   . Anxiety   . Arthritis    inflammitory  . Asthma   . B12 deficiency   . Back pain   . Chronic joint pain   . Constipation   . DDD (degenerative disc disease), lumbar    low back and neck and shoulders  . Depression    during divorce & legal matters  . Fatty liver   . Fibromyalgia   . Gallbladder problem   . GERD (gastroesophageal reflux disease)   . History of kidney stones    Dr Phebe Colla  . History of polycystic ovarian disease S/P BSO  . Hx of anxiety disorder   . Hypertension    a  . Hypothyroidism    no meds  . IBS (irritable bowel syndrome)   . IBS (irritable bowel  syndrome)   . Infertility, female   . Joint pain   . Kidney problem   . Lactose intolerance   . Lupus (HCC) hx positive ANA   followed by Dr Gavin Pound; possible lupus  . Multiple food allergies   . Myalgia   . Osteoarthritis   . 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 POLYCYSTIC OVARIAN DISEASE 03/02/2007   Qualifier: Diagnosis of  By: Linna Darner MD, Rae Mar BSO for cysts & endometriosis; Dr Reino Kent, Gyn Seeing Dr Paula Compton    . PONV (postoperative nausea and vomiting)   . Prediabetes   . Sweating profusely   . Symptomatic mammary hypertrophy   . URI (upper respiratory infection)    currently on cefdinir    PAST SURGICAL HISTORY: Past Surgical History:  Procedure Laterality Date  . [place]   exploratory lap  . BREAST REDUCTION SURGERY Bilateral 03/26/2015   Procedure: BILATERAL BREAST REDUCTION ;  Surgeon: Wallace Going, DO;  Location: [place];  Service: Plastics;  Laterality: Bilateral;  . CARPAL TUNNEL RELEASE     bilateral; Dr Sherwood Gambler  . COLONOSCOPY     in 1990s  . CYSTO/ RIGHT RETROGRADE URETERAL PYELOGRAM  07-09-2004   HX BILATERAL RENAL STONES/ RIGHT FLANK PAIN  . CYSTOSCOPY W/ URETERAL STENT PLACEMENT  11/12/2011   Procedure: CYSTOSCOPY WITH RETROGRADE PYELOGRAM/URETERAL STENT PLACEMENT;  Surgeon: Alexis Frock, MD;  Location: [facility];  Service: Urology;  Laterality: Right;  . CYSTOSCOPY W/ URETERAL STENT REMOVAL  12/07/2011   Procedure: CYSTOSCOPY WITH STENT REMOVAL;  Surgeon: Alexis Frock, MD;  Location: [facility];  Service: Urology;  Laterality: Right;  . CYSTOSCOPY/RETROGRADE/URETEROSCOPY/STONE EXTRACTION WITH BASKET  12/07/2011   Procedure: CYSTOSCOPY/RETROGRADE/URETEROSCOPY/STONE EXTRACTION WITH BASKET;  Surgeon: Alexis Frock, MD;  Location: [facility];  Service: Urology;  Laterality: Right;  . DILATION AND CURETTAGE OF UTERUS     X5  . EXCISION LEFT BARTHOLIN  GLAND  02-05-2002  . EYE SURGERY     Retinal tears surgery bilateral  . LAPAROSCOPIC ASSISTED VAGINAL HYSTERECTOMY  2000  . LAPAROSCOPIC CHOLECYSTECTOMY  05-31-2007  . LAPAROSCOPIC LASER ABLATION ENDOMETRIOSIS AND LEFT SALPINGO-OOPHORECTOMY  11-03-1999  . LAPAROSCOPY WITH RIGHT SALPINGO-OOPHECTOMY/ LYSIS ADHESIONS AND ABLATION ENDOMETRIOSIS  05-17-2001  . LIPOSUCTION Bilateral 03/26/2015   Procedure: WITH LIPOSUCTION;  Surgeon: Loel Lofty Dillingham, DO;  Location: [facility];  Service: Plastics;  Laterality: Bilateral;  . LUMBAR LAMINECTOMY  2003   L4 - L5  . PLANTAR FASCIA SURGERY     right  . PLANTAR FASCIA SURGERY  02/2011   left  . RIGHT URETEROSCOPIC STONE EXTRACTION  08-04-2000  . TONSILLECTOMY    . URETERAL STENT PLACEMENT  11/12/11   Dr Tresa Moore    SOCIAL HISTORY: Social History   Tobacco Use  . Smoking status: Never Smoker  . Smokeless tobacco: Never Used  Substance Use Topics  . Alcohol use: Yes    Alcohol/week: 1.0 standard drinks    Types: 1 Shots of liquor per week    Comment:  very rarely  . Drug use: No    FAMILY HISTORY: Family History  Problem Relation Age of Onset  . Hypertension Mother   . Colon polyps Mother   . Atrial fibrillation Mother        Dr Angelena Form  . Hyperlipidemia Mother   . Heart disease Mother   . Anxiety disorder Mother   . Sleep apnea Mother   . Obesity Mother   . Heart attack Father 96  . Diabetes Father   . Hypertension Father   . Hyperlipidemia Father   . Heart disease Father   . Stroke Father   . Obesity Father   . Hypertension Sister   . Heart attack Paternal Grandmother 29  . Breast cancer Paternal Aunt   . Colon cancer Maternal Uncle   . Colon cancer Paternal Uncle   . Colon polyps Maternal Grandmother   . Stroke Maternal Grandmother 76  . Diabetes Paternal Grandfather   . Stroke Maternal Grandfather 37    ROS: Review of Systems  Gastrointestinal: Negative for nausea and vomiting.  Musculoskeletal:        Negative for muscle weakness.  Endo/Heme/Allergies:       Negative for hypoglycemia.  Psychiatric/Behavioral: Positive for depression. The patient is nervous/anxious. The patient does not have insomnia.     PHYSICAL EXAM: Pt in no acute distress  RECENT LABS AND TESTS: BMET    Component Value Date/Time   NA 142 05/03/2018 1348   K 4.1 05/03/2018 1348   CL 106 05/03/2018 1348   CO2 22 05/03/2018 1348   GLUCOSE 76 05/03/2018 1348   GLUCOSE 92 08/16/2016 1011   BUN 22 05/03/2018 1348   CREATININE 0.86 05/03/2018 1348   CALCIUM 9.4 05/03/2018 1348   GFRNONAA 80 05/03/2018 1348   GFRAA 92 05/03/2018 1348   Lab Results  Component Value Date   HGBA1C 5.4 05/03/2018   HGBA1C 5.4 01/09/2018   HGBA1C 5.8 (H) 07/04/2017   HGBA1C 5.6 02/06/2017   HGBA1C 5.6 09/28/2016   Lab Results  Component Value Date   INSULIN 17.8 05/03/2018   INSULIN 15.3 01/09/2018   INSULIN 10.4 07/04/2017   INSULIN 28.1 (H) 02/06/2017   INSULIN 14.7 09/28/2016   CBC    Component Value Date/Time   WBC 7.6 01/09/2018 1338   WBC 6.6 08/16/2016 1011   RBC 4.69 01/09/2018 1338   RBC 4.68 08/16/2016 1011   HGB 13.1 01/09/2018 1338   HCT 39.5 01/09/2018 1338   PLT 235.0 08/16/2016 1011   MCV 84 01/09/2018 1338   MCH 27.9 01/09/2018 1338   MCH 27.0 04/19/2015 0650   MCHC 33.2 01/09/2018 1338   MCHC 33.4 08/16/2016 1011   RDW 14.7 01/09/2018 1338   LYMPHSABS 2.4 01/09/2018 1338   MONOABS 0.6 08/16/2016 1011   EOSABS 0.2 01/09/2018  1338   BASOSABS 0.1 01/09/2018 1338   Iron/TIBC/Ferritin/ %Sat No results found for: IRON, TIBC, FERRITIN, IRONPCTSAT Lipid Panel     Component Value Date/Time   CHOL 157 01/09/2018 1338   TRIG 84 01/09/2018 1338   HDL 58 01/09/2018 1338   CHOLHDL 3 08/16/2016 1011   VLDL 12.2 08/16/2016 1011   LDLCALC 82 01/09/2018 1338   Hepatic Function Panel     Component Value Date/Time   PROT 6.6 05/03/2018 1348   ALBUMIN 4.2 05/03/2018 1348   AST 23 05/03/2018  1348   ALT 20 05/03/2018 1348   ALKPHOS 90 05/03/2018 1348   BILITOT <0.2 05/03/2018 1348   BILIDIR 0.1 06/12/2013 1632   IBILI 0.2 06/11/2009 2309      Component Value Date/Time   TSH 1.840 01/09/2018 1338   TSH 2.100 07/04/2017 1216   TSH 1.54 08/16/2016 1011   Results for Menser, Larayne E "KIM" (MRN [id number]) as of 07/09/2018 10:30  Ref. Range 05/03/2018 13:48  Vitamin D, 25-Hydroxy Latest Ref Range: 30.0 - 100.0 ng/mL 44.1    I, Marcille Blanco, CMA, am acting as transcriptionist for Starlyn Skeans, MD I have reviewed the above documentation for accuracy and completeness, and I agree with the above. -Dennard Nip, MD

## 2018-07-10 ENCOUNTER — Other Ambulatory Visit (INDEPENDENT_AMBULATORY_CARE_PROVIDER_SITE_OTHER): Payer: Self-pay | Admitting: Family Medicine

## 2018-07-10 DIAGNOSIS — F418 Other specified anxiety disorders: Secondary | ICD-10-CM

## 2018-07-10 DIAGNOSIS — R7303 Prediabetes: Secondary | ICD-10-CM

## 2018-07-11 MED ORDER — METFORMIN HCL 500 MG PO TABS: 500.0000 mg | ORAL_TABLET | Freq: Two times a day (BID) | ORAL | 0 refills | Status: DC

## 2018-07-11 MED ORDER — LIRAGLUTIDE 18 MG/3ML ~~LOC~~ SOPN: 1.2000 mg | PEN_INJECTOR | Freq: Every morning | SUBCUTANEOUS | 0 refills | Status: DC

## 2018-07-11 MED ORDER — VITAMIN D (ERGOCALCIFEROL) 1.25 MG (50000 UNIT) PO CAPS: 50000.0000 [IU] | ORAL_CAPSULE | ORAL | 0 refills | Status: DC

## 2018-07-11 MED ORDER — TOPIRAMATE 50 MG PO TABS: 50.0000 mg | ORAL_TABLET | Freq: Every day | ORAL | 0 refills | Status: DC

## 2018-07-29 NOTE — Progress Notes (Signed)
Virtual Visit via Video Note  I connected with [name] on 07/29/18 at  9:15 AM EDT by a video enabled telemedicine application and verified that I am speaking with the correct person using two identifiers.   I discussed the limitations of evaluation and management by telemedicine and the availability of in person appointments. The patient expressed understanding and agreed to proceed.  The patient is currently at home and I am in the office.    No referring provider.    History of Present Illness: She is here for follow up of her chronic medical conditions.    She is exercising regularly.     She is following at the weight loss clinic.  She thinks she has lost a few pounds.    Hypertension: She is taking her medication daily. She is compliant with a low sodium diet.  She denies chest pain, palpitations, edema, shortness of breath and regular headaches. She does not monitor her blood pressure at home.    Prediabetes:  She is compliant with a low sugar/carbohydrate diet.  She is exercising regularly.  Anxiety: She is taking her medication daily as prescribed. She denies any side effects from the medication. She feels her anxiety is well controlled and she is happy with her current dose of medication.   GERD:  She is taking her medication daily as prescribed.  She denies any GERD symptoms and feels her GERD is well controlled.   Asthma, allergies:  She has some coughing, wheezing and tightness in her chest.  She often feels like she needs to get more air when she is outside.  She has a lot of coughing at night, especially as when she is outside a lot.  She is using her Advair twice daily, her pro-air sometimes 3 times a day, taking Zyrtec daily, Singulair daily and using her Flonase as needed.  IBS, with chronic constipation: She is chronic constipation and typically takes a stool softener and MiraLAX daily.  At times she will still get significant constipation.  Yesterday she drank  a bottle citrate because she was so constipated.  She has been impacted in the past.  She will get incredibly uncomfortable at times and was wondering what else she could take for her chronic constipation.  She thinks she took the medication prescribed by GI a while ago, but does not recall what it was or if it worked.   Review of Systems  Constitutional: Negative for chills, fever and malaise/fatigue.  Respiratory: Positive for cough, shortness of breath (feels like she does not get air sometimes) and wheezing.   Cardiovascular: Negative for chest pain, palpitations and leg swelling.  Gastrointestinal: Positive for constipation and nausea (from IBS).  Neurological: Positive for headaches (sinus related). Negative for dizziness.  Psychiatric/Behavioral: The patient is nervous/anxious (controlled).      Social History   Socioeconomic History  . Marital status: Married    Spouse name: Darnelle Maffucci  . Number of children: 0  . Years of education: college  . Highest education level: Not on file  Occupational History  . Occupation: disabled  Social Needs  . Financial resource strain: Not on file  . Food insecurity:    Worry: Not on file    Inability: Not on file  . Transportation needs:    Medical: Not on file    Non-medical: Not on file  Tobacco Use  . Smoking status: Never Smoker  . Smokeless tobacco: Never Used  Substance and Sexual Activity  . Alcohol  use: Yes    Alcohol/week: 1.0 standard drinks    Types: 1 Shots of liquor per week    Comment:  very rarely  . Drug use: No  . Sexual activity: Yes    Birth control/protection: Surgical  Lifestyle  . Physical activity:    Days per week: Not on file    Minutes per session: Not on file  . Stress: Not on file  Relationships  . Social connections:    Talks on phone: Not on file    Gets together: Not on file    Attends religious service: Not on file    Active member of club or organization: Not on file    Attends meetings of  clubs or organizations: Not on file    Relationship status: Not on file  Other Topics Concern  . Not on file  Social History Narrative   Patient Lives at home with her husband Darnelle Maffucci)   Disabled.   Education two years of college.   Right handed.   Caffeine coffee and sweet tea. Not daily.     Observations/Objective: Appears well in NAD Breathing comfortably, not short of breath Normal mood and affect   Lab Results  Component Value Date   WBC 7.6 01/09/2018   HGB 13.1 01/09/2018   HCT 39.5 01/09/2018   PLT 235.0 08/16/2016   GLUCOSE 76 05/03/2018   CHOL 157 01/09/2018   TRIG 84 01/09/2018   HDL 58 01/09/2018   LDLCALC 82 01/09/2018   ALT 20 05/03/2018   AST 23 05/03/2018   NA 142 05/03/2018   K 4.1 05/03/2018   CL 106 05/03/2018   CREATININE 0.86 05/03/2018   BUN 22 05/03/2018   CO2 22 05/03/2018   TSH 1.840 01/09/2018   HGBA1C 5.4 05/03/2018    Assessment and Plan:  See Problem List for Assessment and Plan of chronic medical problems.   Follow Up Instructions:    I discussed the assessment and treatment plan with the patient. The patient was provided an opportunity to ask questions and all were answered. The patient agreed with the plan and demonstrated an understanding of the instructions.   The patient was advised to call back or seek an in-person evaluation if the symptoms worsen or if the condition fails to improve as anticipated.  FU in 6 months   Binnie Rail, MD

## 2018-07-30 ENCOUNTER — Ambulatory Visit (INDEPENDENT_AMBULATORY_CARE_PROVIDER_SITE_OTHER): Payer: Self-pay | Admitting: Family Medicine

## 2018-07-30 ENCOUNTER — Ambulatory Visit (INDEPENDENT_AMBULATORY_CARE_PROVIDER_SITE_OTHER): Payer: PPO | Admitting: Internal Medicine

## 2018-07-30 ENCOUNTER — Encounter: Payer: Self-pay | Admitting: Internal Medicine

## 2018-07-30 DIAGNOSIS — J4540 Moderate persistent asthma, uncomplicated: Secondary | ICD-10-CM | POA: Diagnosis not present

## 2018-07-30 DIAGNOSIS — R7303 Prediabetes: Secondary | ICD-10-CM

## 2018-07-30 DIAGNOSIS — I10 Essential (primary) hypertension: Secondary | ICD-10-CM

## 2018-07-30 DIAGNOSIS — K219 Gastro-esophageal reflux disease without esophagitis: Secondary | ICD-10-CM

## 2018-07-30 DIAGNOSIS — K581 Irritable bowel syndrome with constipation: Secondary | ICD-10-CM

## 2018-07-30 DIAGNOSIS — F419 Anxiety disorder, unspecified: Secondary | ICD-10-CM | POA: Diagnosis not present

## 2018-07-30 MED ORDER — FLUTICASONE-SALMETEROL 250-50 MCG/DOSE IN AEPB: 1.0000 | INHALATION_SPRAY | Freq: Two times a day (BID) | RESPIRATORY_TRACT | 3 refills | Status: DC

## 2018-07-30 MED ORDER — LINACLOTIDE 145 MCG PO CAPS: 145.0000 ug | ORAL_CAPSULE | Freq: Every day | ORAL | 5 refills | Status: DC

## 2018-07-30 NOTE — Assessment & Plan Note (Signed)
Moderate, persistent Not currently controlled while taking Advair 100-50 twice daily, pro-air more than once a day, singular, Zyrtec and Flonase as needed We will increase Advair to 250-50 for allergy season and hopefully once allergy season is done she can go back to the Advair 100-50 She will call me if there is no improvement or any concerns Continue other medications

## 2018-07-30 NOTE — Assessment & Plan Note (Signed)
BP Readings from Last 3 Encounters:  06/14/18 121/77  06/11/18 130/74  05/03/18 118/77    Blood pressure has always been well controlled Continue current medication at current dose

## 2018-07-30 NOTE — Assessment & Plan Note (Signed)
Controlled, stable Continue current dose of medication  

## 2018-07-30 NOTE — Assessment & Plan Note (Signed)
With chronic constipation Currently taking MiraLAX and stool softener daily, but still has constipation sometimes severe Trial of Linzess 145 mcg daily She will let me know if this is covered by her insurance and if it is effective-can adjust dose if needed She may or may not need a stool softener or MiraLAX in addition to this

## 2018-07-30 NOTE — Assessment & Plan Note (Signed)
Lab Results  Component Value Date   HGBA1C 5.4 05/03/2018   Sugars have been in the normal range Continue metformin-prescribed by Dr Leafy Ro Continue regular exercise, healthy diet and weight loss efforts

## 2018-07-30 NOTE — Assessment & Plan Note (Signed)
GERD controlled Continue daily medication  

## 2018-07-31 ENCOUNTER — Other Ambulatory Visit (INDEPENDENT_AMBULATORY_CARE_PROVIDER_SITE_OTHER): Payer: Self-pay | Admitting: Family Medicine

## 2018-07-31 DIAGNOSIS — R7303 Prediabetes: Secondary | ICD-10-CM

## 2018-08-02 ENCOUNTER — Ambulatory Visit (INDEPENDENT_AMBULATORY_CARE_PROVIDER_SITE_OTHER): Payer: PPO | Admitting: Family Medicine

## 2018-08-02 ENCOUNTER — Other Ambulatory Visit: Payer: Self-pay

## 2018-08-02 ENCOUNTER — Encounter (INDEPENDENT_AMBULATORY_CARE_PROVIDER_SITE_OTHER): Payer: Self-pay | Admitting: Family Medicine

## 2018-08-02 DIAGNOSIS — R7303 Prediabetes: Secondary | ICD-10-CM | POA: Diagnosis not present

## 2018-08-02 DIAGNOSIS — E669 Obesity, unspecified: Secondary | ICD-10-CM | POA: Diagnosis not present

## 2018-08-02 DIAGNOSIS — Z6833 Body mass index (BMI) 33.0-33.9, adult: Secondary | ICD-10-CM | POA: Diagnosis not present

## 2018-08-02 DIAGNOSIS — F3289 Other specified depressive episodes: Secondary | ICD-10-CM | POA: Diagnosis not present

## 2018-08-02 MED ORDER — METFORMIN HCL 500 MG PO TABS: 500.0000 mg | ORAL_TABLET | Freq: Two times a day (BID) | ORAL | 0 refills | Status: DC

## 2018-08-05 ENCOUNTER — Other Ambulatory Visit: Payer: Self-pay | Admitting: Internal Medicine

## 2018-08-06 ENCOUNTER — Encounter (INDEPENDENT_AMBULATORY_CARE_PROVIDER_SITE_OTHER): Payer: Self-pay | Admitting: Family Medicine

## 2018-08-06 NOTE — Progress Notes (Signed)
Office: [phone]  /  Fax: [phone] TeleHealth Visit:  [name] has verbally consented to this TeleHealth visit today. The patient is located at home, the provider is located at the [employer] [facility] office. The participants in this visit include the listed provider and patient. The visit was conducted today via FaceTime.  HPI:   Chief Complaint: OBESITY [name] is here to discuss her progress with her obesity treatment plan. She is keeping a food journal with 1500-1600 calories and 100 grams of protein and is following her eating plan approximately 80% of the time. She states she is exercising 0 minutes 0 times per week. [name] is working outside quite a bit and has been very active. She reports having fairly severe carb cravings which she often gives in to. She states she is journaling consistently. We were unable to weigh the patient today for this TeleHealth visit. She feels as if she has gained 2 pounds since her last visit. She has lost 20 lbs since starting treatment with [place].  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 She often snacks when she is not hungry. [name] sometimes feels she is out of control and then feels guilty that she made poor food choices. She has been working on behavior modification techniques to help reduce her emotional eating and has not been successful. [name] is on Topamax but is unsure if it is helping. She had been on bupropion BID but was unable to remember to take her second dose. She shows no sign of suicidal or homicidal ideations.  Depression screen [facility] 2/9 09/28/2016 07/29/2015 09/22/2014  Decreased Interest 2 0 0  Down, Depressed, Hopeless 1 0 0  PHQ - 2 Score 3 0 0  Altered sleeping 1 - -  Tired, decreased energy 2 - -  Change in appetite 1 - -  Feeling bad or failure about yourself   1 - -  Trouble concentrating 2 - -  Moving slowly or fidgety/restless 1 - -  Suicidal thoughts 0 - -  PHQ-9 Score 11 - -  Some recent data might be hidden   Insulin Resistance Belenda has a diagnosis of insulin resistance based on her elevated fasting insulin level >5. Although Giannamarie's blood glucose readings are still under good control, insulin resistance puts her at greater risk of metabolic syndrome and diabetes. She is taking metformin currently BID and continues to work on diet and exercise to decrease risk of diabetes. No polyphagia. Reports carb cravings. Lab Results  Component Value Date   HGBA1C 5.4 05/03/2018    ASSESSMENT AND PLAN:  Prediabetes - Plan: metFORMIN (GLUCOPHAGE) 500 MG tablet  Other depression - with emotional eating  Class 1 obesity with serious comorbidity and body mass index (BMI) of 33.0 to 33.9 in adult, unspecified obesity type  PLAN:  Depression with anxiety with Emotional Eating Behaviors We discussed behavior modification techniques today to help [name] deal with her emotional eating and depression. Bryli will discontinue Topamax since she is unsure if it is helping with cravings (tapering instructions provided). Change dose of bupropion to 150 mg BID (no refill needed). [name] reports she has a large number of 150 mg bupropion pills left over from previous prescription that she would like to use up.[name] She will set alarm for reminders so she can remember the second dose.  Insulin Resistance [name] will continue to work  on weight loss, exercise, and decreasing simple carbohydrates in her diet to help decrease the risk of diabetes.  [name] is currently on metformin and a refill prescription was written today for 500 mg BID #60 with 0 refills. Alayiah agrees to follow-up with our clinic in 2 weeks.  Obesity [name] is currently in the action stage of change. As such, her goal is to continue with weight loss efforts. She has agreed to keep a  food journal with 1500-1600 calories and 100 grams of protein daily. We discussed the following Behavioral Modification Strategies today: decreasing simple carbohydrates, planning for success, and keep a strict food journal.  [name] has agreed to follow-up with our clinic in 2 weeks. She was informed of the importance of frequent follow-up visits to maximize her success with intensive lifestyle modifications for her multiple health conditions.  ALLERGIES: Allergies  Allergen Reactions  . Chloraprep One Step [Chlorhexidine Gluconate] Rash    Developed severe rash where chloraprep was used on chest area  . Ivp Dye [Iodinated Diagnostic Agents] Rash and Other (See Comments)    Flushing, minor facial rash & dyspnea  . Nalbuphine Shortness Of Breath and Rash    Nubain caused respiratory distress & rash  . Septra [Sulfamethoxazole-Trimethoprim] Shortness Of Breath and Rash  . Sulfamethoxazole-Trimethoprim Rash    rash  . Tylox [Oxycodone-Acetaminophen] Rash  . Augmentin [Amoxicillin-Pot Clavulanate] Nausea And Vomiting    MEDICATIONS: Current Outpatient Medications on File Prior to Visit  Medication Sig Dispense Refill  . buPROPion (WELLBUTRIN SR) 150 MG 12 hr tablet Take 150 mg by mouth 2 (two) times daily.    [name] aspirin 81 MG tablet Take 81 mg by mouth daily.     . clobetasol cream (TEMOVATE) 0.97 % Apply 1 application topically 2 (two) times daily. 30 g 0  . famotidine (PEPCID) 40 MG tablet Take 1 tablet (40 mg total) by mouth daily. 90 tablet 1  . Fluticasone-Salmeterol (ADVAIR DISKUS) 100-50 MCG/DOSE AEPB Inhale 1 puff into the lungs 2 (two) times daily. 60 each 5  . Fluticasone-Salmeterol (ADVAIR DISKUS) 250-50 MCG/DOSE AEPB Inhale 1 puff into the lungs 2 (two) times daily. 1 each 3  . hydrochlorothiazide (MICROZIDE) 12.5 MG capsule TAKE 1 CAPSULE BY MOUTH EVERY DAY 90 capsule 0  . Insulin Pen Needle (BD PEN NEEDLE NANO 2ND GEN) 32G X 4 MM MISC 1 Package by Does not apply route 2  (two) times daily. 100 each 0  . linaclotide (LINZESS) 145 MCG CAPS capsule Take 1 capsule (145 mcg total) by mouth daily before breakfast. 30 capsule 5  . liraglutide (VICTOZA) 18 MG/3ML SOPN Inject 0.2 mLs (1.2 mg total) into the skin every morning. 2 pen 0  . lisinopril (PRINIVIL,ZESTRIL) 40 MG tablet TAKE 1 TABLET BY MOUTH EVERY DAY 90 tablet 1  . montelukast (SINGULAIR) 10 MG tablet TAKE 1 TABLET (10 MG TOTAL) BY MOUTH AT BEDTIME. -- OFFICE VISIT NEEDED FOR FURTHER REFILLS 90 tablet 0  . polyethylene glycol powder (GLYCOLAX/MIRALAX) powder Take 17 g by mouth daily. 3350 g 0  . PROAIR HFA 108 (90 Base) MCG/ACT inhaler TAKE 2 PUFFS BY MOUTH EVERY 6 HOURS AS NEEDED FOR WHEEZE OR SHORTNESS OF BREATH 8.5 Inhaler 5  . topiramate (TOPAMAX) 50 MG tablet Take 1 tablet (50 mg total) by mouth daily. 30 tablet 0  . Vitamin D, Ergocalciferol, (DRISDOL) 1.25 MG (50000 UT) CAPS capsule Take 1 capsule (50,000 Units total) by mouth every 7 (seven) days. 4 capsule 0   No current  facility-administered medications on file prior to visit.     PAST MEDICAL HISTORY: Past Medical History:  Diagnosis Date  . Anemia   . Anxiety   . Arthritis    inflammitory  . Asthma   . B12 deficiency   . Back pain   . Chronic joint pain   . Constipation   . DDD (degenerative disc disease), lumbar    low back and neck and shoulders  . Depression    during divorce & legal matters  . Fatty liver   . Fibromyalgia   . Gallbladder problem   . GERD (gastroesophageal reflux disease)   . History of kidney stones    Dr Phebe Colla  . History of polycystic ovarian disease S/P BSO  . Hx of anxiety disorder   . Hypertension    a  . Hypothyroidism    no meds  . IBS (irritable bowel syndrome)   . IBS (irritable bowel syndrome)   . Infertility, female   . Joint pain   . Kidney problem   . Lactose intolerance   . Lupus (HCC) hx positive ANA   followed by Dr Gavin Pound; possible lupus  . Multiple food allergies   .  Myalgia   . Osteoarthritis   . 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 POLYCYSTIC OVARIAN DISEASE 03/02/2007   Qualifier: Diagnosis of  By: Linna Darner MD, Rae Mar BSO for cysts & endometriosis; Dr Reino Kent, Gyn Seeing Dr Paula Compton    . PONV (postoperative nausea and vomiting)   . Prediabetes   . Sweating profusely   . Symptomatic mammary hypertrophy   . URI (upper respiratory infection)    currently on cefdinir    PAST SURGICAL HISTORY: Past Surgical History:  Procedure Laterality Date  . [place]   exploratory lap  . BREAST REDUCTION SURGERY Bilateral 03/26/2015   Procedure: BILATERAL BREAST REDUCTION ;  Surgeon: Wallace Going, DO;  Location: [facility];  Service: Plastics;  Laterality: Bilateral;  . CARPAL TUNNEL RELEASE     bilateral; Dr Sherwood Gambler  . COLONOSCOPY     in 1990s  . CYSTO/ RIGHT RETROGRADE URETERAL PYELOGRAM  07-09-2004   HX BILATERAL RENAL STONES/ RIGHT FLANK PAIN  . CYSTOSCOPY W/ URETERAL STENT PLACEMENT  11/12/2011   Procedure: CYSTOSCOPY WITH RETROGRADE PYELOGRAM/URETERAL STENT PLACEMENT;  Surgeon: Alexis Frock, MD;  Location: [facility];  Service: Urology;  Laterality: Right;  . CYSTOSCOPY W/ URETERAL STENT REMOVAL  12/07/2011   Procedure: CYSTOSCOPY WITH STENT REMOVAL;  Surgeon: Alexis Frock, MD;  [facility];  Service: Urology;  Laterality: Right;  . CYSTOSCOPY/RETROGRADE/URETEROSCOPY/STONE EXTRACTION WITH BASKET  12/07/2011   Procedure: CYSTOSCOPY/RETROGRADE/URETEROSCOPY/STONE EXTRACTION WITH BASKET;  Surgeon: Alexis Frock, MD;  Location: [facility];  Service: Urology;  Laterality: Right;  . DILATION AND CURETTAGE OF UTERUS     X5  . EXCISION LEFT BARTHOLIN GLAND  02-05-2002  . EYE SURGERY     Retinal tears surgery bilateral  . LAPAROSCOPIC ASSISTED VAGINAL HYSTERECTOMY  2000  . LAPAROSCOPIC CHOLECYSTECTOMY  05-31-2007  . LAPAROSCOPIC LASER ABLATION  ENDOMETRIOSIS AND LEFT SALPINGO-OOPHORECTOMY  11-03-1999  . LAPAROSCOPY WITH RIGHT SALPINGO-OOPHECTOMY/ LYSIS ADHESIONS AND ABLATION ENDOMETRIOSIS  05-17-2001  . LIPOSUCTION Bilateral 03/26/2015   Procedure: WITH LIPOSUCTION;  Surgeon: Wallace Going, DO;  Location: [facility];  Service: Plastics;  Laterality: Bilateral;  . LUMBAR LAMINECTOMY  2003   L4 - L5  .  PLANTAR FASCIA SURGERY     right  . PLANTAR FASCIA SURGERY  02/2011   left  . RIGHT URETEROSCOPIC STONE EXTRACTION  08-04-2000  . TONSILLECTOMY    . URETERAL STENT PLACEMENT  11/12/11   Dr Tresa Moore    SOCIAL HISTORY: Social History   Tobacco Use  . Smoking status: Never Smoker  . Smokeless tobacco: Never Used  Substance Use Topics  . Alcohol use: Yes    Alcohol/week: 1.0 standard drinks    Types: 1 Shots of liquor per week    Comment:  very rarely  . Drug use: No    FAMILY HISTORY: Family History  Problem Relation Age of Onset  . Hypertension Mother   . Colon polyps Mother   . Atrial fibrillation Mother        Dr Angelena Form  . Hyperlipidemia Mother   . Heart disease Mother   . Anxiety disorder Mother   . Sleep apnea Mother   . Obesity Mother   . Heart attack Father 57  . Diabetes Father   . Hypertension Father   . Hyperlipidemia Father   . Heart disease Father   . Stroke Father   . Obesity Father   . Hypertension Sister   . Heart attack Paternal Grandmother 58  . Breast cancer Paternal Aunt   . Colon cancer Maternal Uncle   . Colon cancer Paternal Uncle   . Colon polyps Maternal Grandmother   . Stroke Maternal Grandmother 76  . Diabetes Paternal Grandfather   . Stroke Maternal Grandfather 37   ROS: Review of Systems  Endo/Heme/Allergies:       Negative for polyphagia.  Psychiatric/Behavioral: Positive for depression (with anxiety/emotional eating). Negative for suicidal ideas.       Negative for homicidal ideas.   PHYSICAL EXAM: Pt in no acute distress  RECENT LABS AND TESTS:  BMET    Component Value Date/Time   NA 142 05/03/2018 1348   K 4.1 05/03/2018 1348   CL 106 05/03/2018 1348   CO2 22 05/03/2018 1348   GLUCOSE 76 05/03/2018 1348   GLUCOSE 92 08/16/2016 1011   BUN 22 05/03/2018 1348   CREATININE 0.86 05/03/2018 1348   CALCIUM 9.4 05/03/2018 1348   GFRNONAA 80 05/03/2018 1348   GFRAA 92 05/03/2018 1348   Lab Results  Component Value Date   HGBA1C 5.4 05/03/2018   HGBA1C 5.4 01/09/2018   HGBA1C 5.8 (H) 07/04/2017   HGBA1C 5.6 02/06/2017   HGBA1C 5.6 09/28/2016   Lab Results  Component Value Date   INSULIN 17.8 05/03/2018   INSULIN 15.3 01/09/2018   INSULIN 10.4 07/04/2017   INSULIN 28.1 (H) 02/06/2017   INSULIN 14.7 09/28/2016   CBC    Component Value Date/Time   WBC 7.6 01/09/2018 1338   WBC 6.6 08/16/2016 1011   RBC 4.69 01/09/2018 1338   RBC 4.68 08/16/2016 1011   HGB 13.1 01/09/2018 1338   HCT 39.5 01/09/2018 1338   PLT 235.0 08/16/2016 1011   MCV 84 01/09/2018 1338   MCH 27.9 01/09/2018 1338   MCH 27.0 04/19/2015 0650   MCHC 33.2 01/09/2018 1338   MCHC 33.4 08/16/2016 1011   RDW 14.7 01/09/2018 1338   LYMPHSABS 2.4 01/09/2018 1338   MONOABS 0.6 08/16/2016 1011   EOSABS 0.2 01/09/2018 1338   BASOSABS 0.1 01/09/2018 1338   Iron/TIBC/Ferritin/ %Sat No results found for: IRON, TIBC, FERRITIN, IRONPCTSAT Lipid Panel     Component Value Date/Time   CHOL 157 01/09/2018 1338   TRIG  84 01/09/2018 1338   HDL 58 01/09/2018 1338   CHOLHDL 3 08/16/2016 1011   VLDL 12.2 08/16/2016 1011   LDLCALC 82 01/09/2018 1338   Hepatic Function Panel     Component Value Date/Time   PROT 6.6 05/03/2018 1348   ALBUMIN 4.2 05/03/2018 1348   AST 23 05/03/2018 1348   ALT 20 05/03/2018 1348   ALKPHOS 90 05/03/2018 1348   BILITOT <0.2 05/03/2018 1348   BILIDIR 0.1 06/12/2013 1632   IBILI 0.2 06/11/2009 2309      Component Value Date/Time   TSH 1.840 01/09/2018 1338   TSH 2.100 07/04/2017 1216   TSH 1.54 08/16/2016 1011   Results  for Pardy, Sandrina E "KIM" (MRN [id number]) as of 08/06/2018 10:05  Ref. Range 05/03/2018 13:48  Vitamin D, 25-Hydroxy Latest Ref Range: 30.0 - 100.0 ng/mL 44.1    I, Michaelene Song, am acting as Location manager for [employer], FNP-C.  I have reviewed the above documentation for accuracy and completeness, and I agree with the above.  -  , FNP-C.

## 2018-08-07 ENCOUNTER — Other Ambulatory Visit (INDEPENDENT_AMBULATORY_CARE_PROVIDER_SITE_OTHER): Payer: Self-pay | Admitting: Family Medicine

## 2018-08-07 DIAGNOSIS — R7303 Prediabetes: Secondary | ICD-10-CM

## 2018-08-09 ENCOUNTER — Encounter: Payer: Self-pay | Admitting: Internal Medicine

## 2018-08-09 MED ORDER — LINACLOTIDE 145 MCG PO CAPS: 145.0000 ug | ORAL_CAPSULE | Freq: Every day | ORAL | 1 refills | Status: DC

## 2018-08-10 MED ORDER — NITROFURANTOIN MONOHYD MACRO 100 MG PO CAPS: 100.0000 mg | ORAL_CAPSULE | Freq: Two times a day (BID) | ORAL | 0 refills | Status: DC

## 2018-08-10 NOTE — Addendum Note (Signed)
Addended by: Binnie Rail on: 08/10/2018 03:36 PM   Modules accepted: Orders

## 2018-08-10 NOTE — Telephone Encounter (Signed)
I called pt to see if we can do a virtual visit. She does not have WIFI or good service where she is at and states she can not do one. I asked if she could come in today and she said she could not. She went to get tylenol and states that she is pretty sure she has a kidney stone due to the amount of times she has had it in her life. I told her she probably needs her urine checked and to let us know if she is not better on Monday and she can come in for an appointment. She asked what you recommended for pain besides tylenol.

## 2018-08-16 ENCOUNTER — Other Ambulatory Visit: Payer: Self-pay

## 2018-08-16 ENCOUNTER — Ambulatory Visit (INDEPENDENT_AMBULATORY_CARE_PROVIDER_SITE_OTHER): Payer: PPO | Admitting: Family Medicine

## 2018-08-16 ENCOUNTER — Encounter (INDEPENDENT_AMBULATORY_CARE_PROVIDER_SITE_OTHER): Payer: Self-pay | Admitting: Family Medicine

## 2018-08-16 DIAGNOSIS — E559 Vitamin D deficiency, unspecified: Secondary | ICD-10-CM

## 2018-08-16 DIAGNOSIS — F3289 Other specified depressive episodes: Secondary | ICD-10-CM | POA: Diagnosis not present

## 2018-08-16 DIAGNOSIS — E669 Obesity, unspecified: Secondary | ICD-10-CM | POA: Diagnosis not present

## 2018-08-16 DIAGNOSIS — M25562 Pain in left knee: Secondary | ICD-10-CM | POA: Diagnosis not present

## 2018-08-16 DIAGNOSIS — Z79899 Other long term (current) drug therapy: Secondary | ICD-10-CM | POA: Diagnosis not present

## 2018-08-16 DIAGNOSIS — M064 Inflammatory polyarthropathy: Secondary | ICD-10-CM | POA: Diagnosis not present

## 2018-08-16 DIAGNOSIS — M2550 Pain in unspecified joint: Secondary | ICD-10-CM | POA: Diagnosis not present

## 2018-08-16 DIAGNOSIS — Z6833 Body mass index (BMI) 33.0-33.9, adult: Secondary | ICD-10-CM

## 2018-08-16 DIAGNOSIS — E8881 Metabolic syndrome: Secondary | ICD-10-CM | POA: Diagnosis not present

## 2018-08-16 DIAGNOSIS — M159 Polyosteoarthritis, unspecified: Secondary | ICD-10-CM | POA: Diagnosis not present

## 2018-08-16 MED ORDER — BUPROPION HCL ER (SR) 150 MG PO TB12: 150.0000 mg | ORAL_TABLET | Freq: Every day | ORAL | 0 refills | Status: DC

## 2018-08-16 MED ORDER — VITAMIN D (ERGOCALCIFEROL) 1.25 MG (50000 UNIT) PO CAPS: 50000.0000 [IU] | ORAL_CAPSULE | ORAL | 0 refills | Status: DC

## 2018-08-16 MED ORDER — LIRAGLUTIDE 18 MG/3ML ~~LOC~~ SOPN: 1.2000 mg | PEN_INJECTOR | Freq: Every morning | SUBCUTANEOUS | 0 refills | Status: DC

## 2018-08-19 ENCOUNTER — Encounter: Payer: Self-pay | Admitting: Internal Medicine

## 2018-08-20 NOTE — Progress Notes (Signed)
Office: [phone]  /  Fax: [phone] TeleHealth Visit:  [name] has verbally consented to this TeleHealth visit today. The patient is located at home, the provider is located at the [facility] office. The participants in this visit include the listed provider and patient. The visit was conducted today via doxy.me.  HPI:   Chief Complaint: OBESITY [name] is here to discuss her progress with her obesity treatment plan. She is keeping a food journal with 1500 to 1600 calories and 100 grams of protein and is following her eating plan approximately 75 % of the time. She states she is doing yard work. [name] is still struggling with carb cravings, but has been working on journaling and increasing activity. She does have some hunger issues when she keeps her calories at goal.  We were unable to weigh the patient today for this TeleHealth visit. She feels as if she has gained weight since her last visit. She has lost 20 lbs since starting treatment with [place].  Insulin Resistance [name] has a diagnosis of insulin resistance based on her elevated fasting insulin level >5. Although [name]'s blood glucose readings are still under good control, insulin resistance puts her at greater risk of metabolic syndrome and diabetes. She is struggling to follow her diet prescription closely and notes simple carbs have increased. [name] is taking Victoza currently and continues to work on diet and exercise to decrease risk of diabetes.  Vitamin D Deficiency [name] has a diagnosis of vitamin D deficiency. She is currently stable on vit D, but is not yet at goal. [name] denies nausea, vomiting, or muscle weakness.  Depression with emotional eating behaviors [name] tried to increase her dose of Wellbutrin to BID, but had worsening insomnia, so she decreased to 1 per day. She is still struggling with emotional eating and using food for comfort to the extent that it is negatively  impacting her health. She often snacks when she is not hungry. [name] sometimes feels she is out of control and then feels guilty that she made poor food choices. She has been working on behavior modification techniques to help reduce her emotional eating and has been somewhat successful. She shows no sign of suicidal or homicidal ideations.  ASSESSMENT AND PLAN:  Vitamin D deficiency - Plan: Vitamin D, Ergocalciferol, (DRISDOL) 1.25 MG (50000 UT) CAPS capsule  Insulin resistance - Plan: liraglutide (VICTOZA) 18 MG/3ML SOPN  Other depression - with emotional eating - Plan: buPROPion (WELLBUTRIN SR) 150 MG 12 hr tablet  Class 1 obesity with serious comorbidity and body mass index (BMI) of 33.0 to 33.9 in adult, unspecified obesity type  PLAN:  Insulin Resistance [name] will continue to work on weight loss, exercise, and decreasing simple carbohydrates in her diet to help decrease the risk of diabetes. She was informed that eating too many simple carbohydrates or too many calories at one sitting increases the likelihood of GI side effects. Sanai agreed to increase Victoza to 1.2 mg Harris qAM #2 pens with no refills and prescription was written today. [name] agreed to follow up with [place] as directed to monitor her progress in 2 weeks.  Vitamin D Deficiency [name] was informed that low vitamin D levels contribute to fatigue and are associated with obesity, breast, and colon cancer. Cheryal agrees to continue to take prescription Vit D @50 ,000 IU every week #4 with no refills and will follow up for routine testing of vitamin D, at least 2-3 times per year. She was informed of  the risk of over-replacement of vitamin D and agrees to not increase her dose unless she discusses this with [place] first. [name] agrees to follow up in 2 weeks as directed.  Depression with Emotional Eating Behaviors We discussed behavior modification techniques today to help [name] deal with her emotional eating and  depression. She has agreed to change to take Wellbutrin SR 150 mg qAM #30 with no refills and agreed to follow up as directed.  Obesity [name] is currently in the action stage of change. As such, her goal is to continue with weight loss efforts. She has agreed to keep a food journal with 1500 to 1600 calories and 100 grams of protein.  [name] has been instructed to work up to a goal of 150 minutes of combined cardio and strengthening exercise per week for weight loss and overall health benefits. We discussed the following Behavioral Modification Strategies today: increasing lean protein intake, decreasing simple carbohydrates, keeping healthy foods in the home, keep a strict food journal, and emotional eating strategies.  [name] has agreed to follow up with our clinic in 2 weeks. She was informed of the importance of frequent follow up visits to maximize her success with intensive lifestyle modifications for her multiple health conditions.  ALLERGIES: Allergies  Allergen Reactions  . Chloraprep One Step [Chlorhexidine Gluconate] Rash    Developed severe rash where chloraprep was used on chest area  . Ivp Dye [Iodinated Diagnostic Agents] Rash and Other (See Comments)    Flushing, minor facial rash & dyspnea  . Nalbuphine Shortness Of Breath and Rash    Nubain caused respiratory distress & rash  . Septra [Sulfamethoxazole-Trimethoprim] Shortness Of Breath and Rash  . Sulfamethoxazole-Trimethoprim Rash    rash  . Tylox [Oxycodone-Acetaminophen] Rash  . Augmentin [Amoxicillin-Pot Clavulanate] Nausea And Vomiting    MEDICATIONS: Current Outpatient Medications on File Prior to Visit  Medication Sig Dispense Refill  . aspirin 81 MG tablet Take 81 mg by mouth daily.     . citalopram (CELEXA) 20 MG tablet TAKE 1 TABLET BY MOUTH EVERY DAY 90 tablet 1  . clobetasol cream (TEMOVATE) 5.05 % Apply 1 application topically 2 (two) times daily. 30 g 0  . famotidine (PEPCID) 40 MG tablet Take 1  tablet (40 mg total) by mouth daily. 90 tablet 1  . Fluticasone-Salmeterol (ADVAIR DISKUS) 100-50 MCG/DOSE AEPB Inhale 1 puff into the lungs 2 (two) times daily. 60 each 5  . Fluticasone-Salmeterol (ADVAIR DISKUS) 250-50 MCG/DOSE AEPB Inhale 1 puff into the lungs 2 (two) times daily. 1 each 3  . hydrochlorothiazide (MICROZIDE) 12.5 MG capsule TAKE 1 CAPSULE BY MOUTH EVERY DAY 90 capsule 0  . Insulin Pen Needle (BD PEN NEEDLE NANO 2ND GEN) 32G X 4 MM MISC 1 Package by Does not apply route 2 (two) times daily. 100 each 0  . linaclotide (LINZESS) 145 MCG CAPS capsule Take 1 capsule (145 mcg total) by mouth daily before breakfast. 90 capsule 1  . lisinopril (PRINIVIL,ZESTRIL) 40 MG tablet TAKE 1 TABLET BY MOUTH EVERY DAY 90 tablet 1  . metFORMIN (GLUCOPHAGE) 500 MG tablet Take 1 tablet (500 mg total) by mouth 2 (two) times daily with a meal. 60 tablet 0  . montelukast (SINGULAIR) 10 MG tablet TAKE 1 TABLET (10 MG TOTAL) BY MOUTH AT BEDTIME. -- OFFICE VISIT NEEDED FOR FURTHER REFILLS 90 tablet 0  . nitrofurantoin, macrocrystal-monohydrate, (MACROBID) 100 MG capsule Take 1 capsule (100 mg total) by mouth 2 (two) times daily. 14 capsule 0  .  polyethylene glycol powder (GLYCOLAX/MIRALAX) powder Take 17 g by mouth daily. 3350 g 0  . PROAIR HFA 108 (90 Base) MCG/ACT inhaler TAKE 2 PUFFS BY MOUTH EVERY 6 HOURS AS NEEDED FOR WHEEZE OR SHORTNESS OF BREATH 8.5 Inhaler 5  . topiramate (TOPAMAX) 50 MG tablet Take 1 tablet (50 mg total) by mouth daily. 30 tablet 0   No current facility-administered medications on file prior to visit.     PAST MEDICAL HISTORY: Past Medical History:  Diagnosis Date  . Anemia   . Anxiety   . Arthritis    inflammitory  . Asthma   . B12 deficiency   . Back pain   . Chronic joint pain   . Constipation   . DDD (degenerative disc disease), lumbar    low back and neck and shoulders  . Depression    during divorce & legal matters  . Fatty liver   . Fibromyalgia   .  Gallbladder problem   . GERD (gastroesophageal reflux disease)   . History of kidney stones    Dr Phebe Colla  . History of polycystic ovarian disease S/P BSO  . Hx of anxiety disorder   . Hypertension    a  . Hypothyroidism    no meds  . IBS (irritable bowel syndrome)   . IBS (irritable bowel syndrome)   . Infertility, female   . Joint pain   . Kidney problem   . Lactose intolerance   . Lupus (HCC) hx positive ANA   followed by Dr Gavin Pound; possible lupus  . Multiple food allergies   . Myalgia   . Osteoarthritis   . 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 POLYCYSTIC OVARIAN DISEASE 03/02/2007   Qualifier: Diagnosis of  By: Linna Darner MD, Rae Mar BSO for cysts & endometriosis; Dr Reino Kent, Gyn Seeing Dr Paula Compton    . PONV (postoperative nausea and vomiting)   . Prediabetes   . Sweating profusely   . Symptomatic mammary hypertrophy   . URI (upper respiratory infection)    currently on cefdinir    PAST SURGICAL HISTORY: Past Surgical History:  Procedure Laterality Date  . [place]   exploratory lap  . BREAST REDUCTION SURGERY Bilateral 03/26/2015   Procedure: BILATERAL BREAST REDUCTION ;  Surgeon: Wallace Going, DO;  Location: [facility];  Service: Plastics;  Laterality: Bilateral;  . CARPAL TUNNEL RELEASE     bilateral; Dr Sherwood Gambler  . COLONOSCOPY     in 1990s  . CYSTO/ RIGHT RETROGRADE URETERAL PYELOGRAM  07-09-2004   HX BILATERAL RENAL STONES/ RIGHT FLANK PAIN  . CYSTOSCOPY W/ URETERAL STENT PLACEMENT  11/12/2011   Procedure: CYSTOSCOPY WITH RETROGRADE PYELOGRAM/URETERAL STENT PLACEMENT;  Surgeon: Alexis Frock, MD;  Location: [facility];  Service: Urology;  Laterality: Right;  . CYSTOSCOPY W/ URETERAL STENT REMOVAL  12/07/2011   Procedure: CYSTOSCOPY WITH STENT REMOVAL;  Surgeon: Alexis Frock, MD;  Location: [facility];  Service: Urology;  Laterality: Right;  .  CYSTOSCOPY/RETROGRADE/URETEROSCOPY/STONE EXTRACTION WITH BASKET  12/07/2011   Procedure: CYSTOSCOPY/RETROGRADE/URETEROSCOPY/STONE EXTRACTION WITH BASKET;  Surgeon: Alexis Frock, MD;  Location: [facility];  Service: Urology;  Laterality: Right;  . DILATION AND CURETTAGE OF UTERUS     X5  . EXCISION LEFT BARTHOLIN GLAND  02-05-2002  . EYE SURGERY     Retinal tears surgery bilateral  . LAPAROSCOPIC ASSISTED VAGINAL HYSTERECTOMY  2000  . LAPAROSCOPIC CHOLECYSTECTOMY  05-31-2007  . LAPAROSCOPIC LASER ABLATION ENDOMETRIOSIS AND LEFT SALPINGO-OOPHORECTOMY  11-03-1999  . LAPAROSCOPY WITH RIGHT SALPINGO-OOPHECTOMY/ LYSIS ADHESIONS AND ABLATION ENDOMETRIOSIS  05-17-2001  . LIPOSUCTION Bilateral 03/26/2015   Procedure: WITH LIPOSUCTION;  Surgeon: Wallace Going, DO;  Location: [facility];  Service: Plastics;  Laterality: Bilateral;  . LUMBAR LAMINECTOMY  2003   L4 - L5  . PLANTAR FASCIA SURGERY     right  . PLANTAR FASCIA SURGERY  02/2011   left  . RIGHT URETEROSCOPIC STONE EXTRACTION  08-04-2000  . TONSILLECTOMY    . URETERAL STENT PLACEMENT  11/12/11   Dr Tresa Moore    SOCIAL HISTORY: Social History   Tobacco Use  . Smoking status: Never Smoker  . Smokeless tobacco: Never Used  Substance Use Topics  . Alcohol use: Yes    Alcohol/week: 1.0 standard drinks    Types: 1 Shots of liquor per week    Comment:  very rarely  . Drug use: No    FAMILY HISTORY: Family History  Problem Relation Age of Onset  . Hypertension Mother   . Colon polyps Mother   . Atrial fibrillation Mother        Dr Angelena Form  . Hyperlipidemia Mother   . Heart disease Mother   . Anxiety disorder Mother   . Sleep apnea Mother   . Obesity Mother   . Heart attack Father 72  . Diabetes Father   . Hypertension Father   . Hyperlipidemia Father   . Heart disease Father   . Stroke Father   . Obesity Father   . Hypertension Sister   . Heart attack Paternal Grandmother 46  . Breast  cancer Paternal Aunt   . Colon cancer Maternal Uncle   . Colon cancer Paternal Uncle   . Colon polyps Maternal Grandmother   . Stroke Maternal Grandmother 76  . Diabetes Paternal Grandfather   . Stroke Maternal Grandfather 37    ROS: Review of Systems  Gastrointestinal: Negative for nausea and vomiting.  Musculoskeletal:       Negative for muscle weakness.  Endo/Heme/Allergies:       Negative for hypoglycemia.  Psychiatric/Behavioral: Positive for depression. Negative for suicidal ideas.       Negative for homicidal ideations.    PHYSICAL EXAM: Pt in no acute distress  RECENT LABS AND TESTS: BMET    Component Value Date/Time   NA 142 05/03/2018 1348   K 4.1 05/03/2018 1348   CL 106 05/03/2018 1348   CO2 22 05/03/2018 1348   GLUCOSE 76 05/03/2018 1348   GLUCOSE 92 08/16/2016 1011   BUN 22 05/03/2018 1348   CREATININE 0.86 05/03/2018 1348   CALCIUM 9.4 05/03/2018 1348   GFRNONAA 80 05/03/2018 1348   GFRAA 92 05/03/2018 1348   Lab Results  Component Value Date   HGBA1C 5.4 05/03/2018   HGBA1C 5.4 01/09/2018   HGBA1C 5.8 (H) 07/04/2017   HGBA1C 5.6 02/06/2017   HGBA1C 5.6 09/28/2016   Lab Results  Component Value Date   INSULIN 17.8 05/03/2018   INSULIN 15.3 01/09/2018   INSULIN 10.4 07/04/2017   INSULIN 28.1 (H) 02/06/2017   INSULIN 14.7 09/28/2016   CBC    Component Value Date/Time   WBC 7.6 01/09/2018 1338   WBC 6.6 08/16/2016 1011   RBC 4.69 01/09/2018 1338   RBC 4.68 08/16/2016 1011   HGB 13.1 01/09/2018 1338   HCT 39.5 01/09/2018 1338   PLT 235.0 08/16/2016 1011   MCV 84 01/09/2018 1338  MCH 27.9 01/09/2018 1338   MCH 27.0 04/19/2015 0650   MCHC 33.2 01/09/2018 1338   MCHC 33.4 08/16/2016 1011   RDW 14.7 01/09/2018 1338   LYMPHSABS 2.4 01/09/2018 1338   MONOABS 0.6 08/16/2016 1011   EOSABS 0.2 01/09/2018 1338   BASOSABS 0.1 01/09/2018 1338   Iron/TIBC/Ferritin/ %Sat No results found for: IRON, TIBC, FERRITIN, IRONPCTSAT Lipid Panel      Component Value Date/Time   CHOL 157 01/09/2018 1338   TRIG 84 01/09/2018 1338   HDL 58 01/09/2018 1338   CHOLHDL 3 08/16/2016 1011   VLDL 12.2 08/16/2016 1011   LDLCALC 82 01/09/2018 1338   Hepatic Function Panel     Component Value Date/Time   PROT 6.6 05/03/2018 1348   ALBUMIN 4.2 05/03/2018 1348   AST 23 05/03/2018 1348   ALT 20 05/03/2018 1348   ALKPHOS 90 05/03/2018 1348   BILITOT <0.2 05/03/2018 1348   BILIDIR 0.1 06/12/2013 1632   IBILI 0.2 06/11/2009 2309      Component Value Date/Time   TSH 1.840 01/09/2018 1338   TSH 2.100 07/04/2017 1216   TSH 1.54 08/16/2016 1011   Results for Schalk, Ailea E "KIM" (MRN [id number]) as of 08/20/2018 12:13  Ref. Range 05/03/2018 13:48  Vitamin D, 25-Hydroxy Latest Ref Range: 30.0 - 100.0 ng/mL 44.1    I, Marcille Blanco, CMA, am acting as transcriptionist for Starlyn Skeans, MD I have reviewed the above documentation for accuracy and completeness, and I agree with the above. -Dennard Nip, MD

## 2018-08-21 MED ORDER — LINACLOTIDE 72 MCG PO CAPS: 72.0000 ug | ORAL_CAPSULE | Freq: Every day | ORAL | 5 refills | Status: DC

## 2018-08-24 ENCOUNTER — Other Ambulatory Visit (INDEPENDENT_AMBULATORY_CARE_PROVIDER_SITE_OTHER): Payer: Self-pay | Admitting: Family Medicine

## 2018-08-24 DIAGNOSIS — F3289 Other specified depressive episodes: Secondary | ICD-10-CM

## 2018-08-28 ENCOUNTER — Other Ambulatory Visit (INDEPENDENT_AMBULATORY_CARE_PROVIDER_SITE_OTHER): Payer: Self-pay | Admitting: Family Medicine

## 2018-08-28 DIAGNOSIS — F3289 Other specified depressive episodes: Secondary | ICD-10-CM

## 2018-08-29 ENCOUNTER — Other Ambulatory Visit: Payer: Self-pay | Admitting: Internal Medicine

## 2018-09-02 ENCOUNTER — Other Ambulatory Visit (INDEPENDENT_AMBULATORY_CARE_PROVIDER_SITE_OTHER): Payer: Self-pay | Admitting: Family Medicine

## 2018-09-02 DIAGNOSIS — R7303 Prediabetes: Secondary | ICD-10-CM

## 2018-09-03 ENCOUNTER — Ambulatory Visit (INDEPENDENT_AMBULATORY_CARE_PROVIDER_SITE_OTHER): Payer: PPO | Admitting: Family Medicine

## 2018-09-03 ENCOUNTER — Other Ambulatory Visit: Payer: Self-pay

## 2018-09-03 ENCOUNTER — Encounter (INDEPENDENT_AMBULATORY_CARE_PROVIDER_SITE_OTHER): Payer: Self-pay | Admitting: Family Medicine

## 2018-09-03 DIAGNOSIS — E669 Obesity, unspecified: Secondary | ICD-10-CM

## 2018-09-03 DIAGNOSIS — Z6833 Body mass index (BMI) 33.0-33.9, adult: Secondary | ICD-10-CM

## 2018-09-03 DIAGNOSIS — R7303 Prediabetes: Secondary | ICD-10-CM | POA: Diagnosis not present

## 2018-09-03 MED ORDER — METFORMIN HCL 500 MG PO TABS: 500.0000 mg | ORAL_TABLET | Freq: Two times a day (BID) | ORAL | 0 refills | Status: DC

## 2018-09-03 NOTE — Progress Notes (Signed)
Office: [phone]  /  Fax: [phone] TeleHealth Visit:  [name] has verbally consented to this TeleHealth visit today. The patient is located at home, the provider is located at the [facility] office. The participants in this visit include the listed provider and patient. The visit was conducted today via Face Time.  HPI:   Chief Complaint: OBESITY [name] is here to discuss her progress with her obesity treatment plan. She is keeping a food journal with 1500 to 1600 calories and 100 grams of protein and is following her eating plan approximately 70 % of the time. She states she is walking and doing yard work 60 minutes 4 times per week. [name] has done well maintaining her weight. She has increased exercise and yard work and feels good overall, but is frustrated at how slow her weight loss has been.  We were unable to weigh the patient today for this TeleHealth visit. She feels as if she has maintained weight since her last visit. She has lost 20 lbs since starting treatment with [place].  Insulin Resistance [name] has a diagnosis of insulin resistance based on her elevated fasting insulin level >5. Although [name]'s blood glucose readings are still under good control, insulin resistance puts her at greater risk of metabolic syndrome and diabetes. She is stable on metformin and Victoza and is working on her diet and has increased exercise to decrease risk of diabetes.  ASSESSMENT AND PLAN:  Prediabetes - Plan: metFORMIN (GLUCOPHAGE) 500 MG tablet  Class 1 obesity with serious comorbidity and body mass index (BMI) of 33.0 to 33.9 in adult, unspecified obesity type  PLAN:  Insulin Resistance [name] will continue to work on weight loss, exercise, and decreasing simple carbohydrates in her diet to help decrease the risk of diabetes. She was informed that eating too many simple carbohydrates or too many calories at one sitting increases the likelihood of GI  side effects. [name] agreed to continue metformin 500 mg BID #60 with no refills and prescription was written today. [name] agreed to follow up with [place] as directed to monitor her progress in 2 weeks.  I spent > than 50% of the 25 minute visit on counseling as documented in the note.  Obesity [name] is currently in the action stage of change. As such, her goal is to maintain weight for now during [name]. She has agreed to keep a food journal with 1500 to 1600 calories and 100 grams of protein.  [name] has been instructed to work up to a goal of 150 minutes of combined cardio and strengthening exercise per week for weight loss and overall health benefits. We discussed the following Behavioral Modification Strategies today: increasing lean protein intake, keep a strict food journal, and work on meal planning and easy cooking plans.  [name] has agreed to follow up with our clinic in 2 weeks. She was informed of the importance of frequent follow up visits to maximize her success with intensive lifestyle modifications for her multiple health conditions.  ALLERGIES: Allergies  Allergen Reactions  . Chloraprep One Step [Chlorhexidine Gluconate] Rash    Developed severe rash where chloraprep was used on chest area  . Ivp Dye [Iodinated Diagnostic Agents] Rash and Other (See Comments)    Flushing, minor facial rash & dyspnea  . Nalbuphine Shortness Of Breath and Rash    Nubain caused respiratory distress & rash  . Septra [Sulfamethoxazole-Trimethoprim] Shortness Of Breath and Rash  . Sulfamethoxazole-Trimethoprim Rash    rash  .  Tylox [Oxycodone-Acetaminophen] Rash  . Augmentin [Amoxicillin-Pot Clavulanate] Nausea And Vomiting    MEDICATIONS: Current Outpatient Medications on File Prior to Visit  Medication Sig Dispense Refill  . aspirin 81 MG tablet Take 81 mg by mouth daily.     [name] buPROPion (WELLBUTRIN SR) 150 MG 12 hr tablet Take 1 tablet (150 mg total) by mouth daily. 30 tablet  0  . citalopram (CELEXA) 20 MG tablet TAKE 1 TABLET BY MOUTH EVERY DAY 90 tablet 1  . clobetasol cream (TEMOVATE) 8.09 % Apply 1 application topically 2 (two) times daily. 30 g 0  . famotidine (PEPCID) 40 MG tablet Take 1 tablet (40 mg total) by mouth daily. 90 tablet 1  . Fluticasone-Salmeterol (ADVAIR DISKUS) 100-50 MCG/DOSE AEPB Inhale 1 puff into the lungs 2 (two) times daily. 60 each 5  . Fluticasone-Salmeterol (ADVAIR DISKUS) 250-50 MCG/DOSE AEPB Inhale 1 puff into the lungs 2 (two) times daily. 1 each 3  . hydrochlorothiazide (MICROZIDE) 12.5 MG capsule Take 1 capsule by mouth daily. 90 capsule 1  . Insulin Pen Needle (BD PEN NEEDLE NANO 2ND GEN) 32G X 4 MM MISC 1 Package by Does not apply route 2 (two) times daily. 100 each 0  . linaclotide (LINZESS) 72 MCG capsule Take 1 capsule (72 mcg total) by mouth daily before breakfast. 30 capsule 5  . liraglutide (VICTOZA) 18 MG/3ML SOPN Inject 0.2 mLs (1.2 mg total) into the skin every morning. 2 pen 0  . lisinopril (PRINIVIL,ZESTRIL) 40 MG tablet TAKE 1 TABLET BY MOUTH EVERY DAY 90 tablet 1  . montelukast (SINGULAIR) 10 MG tablet Take 1 tablet (10 mg total) by mouth at bedtime. 90 tablet 1  . nitrofurantoin, macrocrystal-monohydrate, (MACROBID) 100 MG capsule Take 1 capsule (100 mg total) by mouth 2 (two) times daily. 14 capsule 0  . polyethylene glycol powder (GLYCOLAX/MIRALAX) powder Take 17 g by mouth daily. 3350 g 0  . PROAIR HFA 108 (90 Base) MCG/ACT inhaler TAKE 2 PUFFS BY MOUTH EVERY 6 HOURS AS NEEDED FOR WHEEZE OR SHORTNESS OF BREATH 8.5 Inhaler 5  . Vitamin D, Ergocalciferol, (DRISDOL) 1.25 MG (50000 UT) CAPS capsule Take 1 capsule (50,000 Units total) by mouth every 7 (seven) days. 4 capsule 0   No current facility-administered medications on file prior to visit.     PAST MEDICAL HISTORY: Past Medical History:  Diagnosis Date  . Anemia   . Anxiety   . Arthritis    inflammitory  . Asthma   . B12 deficiency   . Back pain   .  Chronic joint pain   . Constipation   . DDD (degenerative disc disease), lumbar    low back and neck and shoulders  . Depression    during divorce & legal matters  . Fatty liver   . Fibromyalgia   . Gallbladder problem   . GERD (gastroesophageal reflux disease)   . History of kidney stones    Dr Phebe Colla  . History of polycystic ovarian disease S/P BSO  . Hx of anxiety disorder   . Hypertension    a  . Hypothyroidism    no meds  . IBS (irritable bowel syndrome)   . IBS (irritable bowel syndrome)   . Infertility, female   . Joint pain   . Kidney problem   . Lactose intolerance   . Lupus (HCC) hx positive ANA   followed by Dr Gavin Pound; possible lupus  . Multiple food allergies   . Myalgia   . Osteoarthritis   .  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 POLYCYSTIC OVARIAN DISEASE 03/02/2007   Qualifier: Diagnosis of  By: Linna Darner MD, Rae Mar BSO for cysts & endometriosis; Dr Reino Kent, Gyn Seeing Dr Paula Compton    . PONV (postoperative nausea and vomiting)   . Prediabetes   . Sweating profusely   . Symptomatic mammary hypertrophy   . URI (upper respiratory infection)    currently on cefdinir    PAST SURGICAL HISTORY: Past Surgical History:  Procedure Laterality Date  . [place]   exploratory lap  . BREAST REDUCTION SURGERY Bilateral 03/26/2015   Procedure: BILATERAL BREAST REDUCTION ;  Surgeon: Wallace Going, DO;  Location: [facility];  Service: Plastics;  Laterality: Bilateral;  . CARPAL TUNNEL RELEASE     bilateral; Dr Sherwood Gambler  . COLONOSCOPY     in 1990s  . CYSTO/ RIGHT RETROGRADE URETERAL PYELOGRAM  07-09-2004   HX BILATERAL RENAL STONES/ RIGHT FLANK PAIN  . CYSTOSCOPY W/ URETERAL STENT PLACEMENT  11/12/2011   Procedure: CYSTOSCOPY WITH RETROGRADE PYELOGRAM/URETERAL STENT PLACEMENT;  Surgeon: Alexis Frock, MD;  Location: [facility];  Service: Urology;  Laterality: Right;  . CYSTOSCOPY W/ URETERAL  STENT REMOVAL  12/07/2011   Procedure: CYSTOSCOPY WITH STENT REMOVAL;  Surgeon: Alexis Frock, MD;  Location: [facility];  Service: Urology;  Laterality: Right;  . CYSTOSCOPY/RETROGRADE/URETEROSCOPY/STONE EXTRACTION WITH BASKET  12/07/2011   Procedure: CYSTOSCOPY/RETROGRADE/URETEROSCOPY/STONE EXTRACTION WITH BASKET;  Surgeon: Alexis Frock, MD;  Location: [facility];  Service: Urology;  Laterality: Right;  . DILATION AND CURETTAGE OF UTERUS     X5  . EXCISION LEFT BARTHOLIN GLAND  02-05-2002  . EYE SURGERY     Retinal tears surgery bilateral  . LAPAROSCOPIC ASSISTED VAGINAL HYSTERECTOMY  2000  . LAPAROSCOPIC CHOLECYSTECTOMY  05-31-2007  . LAPAROSCOPIC LASER ABLATION ENDOMETRIOSIS AND LEFT SALPINGO-OOPHORECTOMY  11-03-1999  . LAPAROSCOPY WITH RIGHT SALPINGO-OOPHECTOMY/ LYSIS ADHESIONS AND ABLATION ENDOMETRIOSIS  05-17-2001  . LIPOSUCTION Bilateral 03/26/2015   Procedure: WITH LIPOSUCTION;  Surgeon: Wallace Going, DO;  Location: [facility];  Service: Plastics;  Laterality: Bilateral;  . LUMBAR LAMINECTOMY  2003   L4 - L5  . PLANTAR FASCIA SURGERY     right  . PLANTAR FASCIA SURGERY  02/2011   left  . RIGHT URETEROSCOPIC STONE EXTRACTION  08-04-2000  . TONSILLECTOMY    . URETERAL STENT PLACEMENT  11/12/11   Dr Tresa Moore    SOCIAL HISTORY: Social History   Tobacco Use  . Smoking status: Never Smoker  . Smokeless tobacco: Never Used  Substance Use Topics  . Alcohol use: Yes    Alcohol/week: 1.0 standard drinks    Types: 1 Shots of liquor per week    Comment:  very rarely  . Drug use: No    FAMILY HISTORY: Family History  Problem Relation Age of Onset  . Hypertension Mother   . Colon polyps Mother   . Atrial fibrillation Mother        Dr Angelena Form  . Hyperlipidemia Mother   . Heart disease Mother   . Anxiety disorder Mother   . Sleep apnea Mother   . Obesity Mother   . Heart attack Father 84  . Diabetes Father   .  Hypertension Father   . Hyperlipidemia Father   . Heart disease Father   . Stroke Father   . Obesity Father   . Hypertension Sister   . Heart attack  Paternal Grandmother 40  . Breast cancer Paternal Aunt   . Colon cancer Maternal Uncle   . Colon cancer Paternal Uncle   . Colon polyps Maternal Grandmother   . Stroke Maternal Grandmother 76  . Diabetes Paternal Grandfather   . Stroke Maternal Grandfather 37    ROS: Review of Systems  Gastrointestinal: Negative for nausea and vomiting.  Endo/Heme/Allergies:       Negative for hypoglycemia.    PHYSICAL EXAM: Pt in no acute distress  RECENT LABS AND TESTS: BMET    Component Value Date/Time   NA 142 05/03/2018 1348   K 4.1 05/03/2018 1348   CL 106 05/03/2018 1348   CO2 22 05/03/2018 1348   GLUCOSE 76 05/03/2018 1348   GLUCOSE 92 08/16/2016 1011   BUN 22 05/03/2018 1348   CREATININE 0.86 05/03/2018 1348   CALCIUM 9.4 05/03/2018 1348   GFRNONAA 80 05/03/2018 1348   GFRAA 92 05/03/2018 1348   Lab Results  Component Value Date   HGBA1C 5.4 05/03/2018   HGBA1C 5.4 01/09/2018   HGBA1C 5.8 (H) 07/04/2017   HGBA1C 5.6 02/06/2017   HGBA1C 5.6 09/28/2016   Lab Results  Component Value Date   INSULIN 17.8 05/03/2018   INSULIN 15.3 01/09/2018   INSULIN 10.4 07/04/2017   INSULIN 28.1 (H) 02/06/2017   INSULIN 14.7 09/28/2016   CBC    Component Value Date/Time   WBC 7.6 01/09/2018 1338   WBC 6.6 08/16/2016 1011   RBC 4.69 01/09/2018 1338   RBC 4.68 08/16/2016 1011   HGB 13.1 01/09/2018 1338   HCT 39.5 01/09/2018 1338   PLT 235.0 08/16/2016 1011   MCV 84 01/09/2018 1338   MCH 27.9 01/09/2018 1338   MCH 27.0 04/19/2015 0650   MCHC 33.2 01/09/2018 1338   MCHC 33.4 08/16/2016 1011   RDW 14.7 01/09/2018 1338   LYMPHSABS 2.4 01/09/2018 1338   MONOABS 0.6 08/16/2016 1011   EOSABS 0.2 01/09/2018 1338   BASOSABS 0.1 01/09/2018 1338   Iron/TIBC/Ferritin/ %Sat No results found for: IRON, TIBC, FERRITIN, IRONPCTSAT  Lipid Panel     Component Value Date/Time   CHOL 157 01/09/2018 1338   TRIG 84 01/09/2018 1338   HDL 58 01/09/2018 1338   CHOLHDL 3 08/16/2016 1011   VLDL 12.2 08/16/2016 1011   LDLCALC 82 01/09/2018 1338   Hepatic Function Panel     Component Value Date/Time   PROT 6.6 05/03/2018 1348   ALBUMIN 4.2 05/03/2018 1348   AST 23 05/03/2018 1348   ALT 20 05/03/2018 1348   ALKPHOS 90 05/03/2018 1348   BILITOT <0.2 05/03/2018 1348   BILIDIR 0.1 06/12/2013 1632   IBILI 0.2 06/11/2009 2309      Component Value Date/Time   TSH 1.840 01/09/2018 1338   TSH 2.100 07/04/2017 1216   TSH 1.54 08/16/2016 1011   Results for Seger, Malin E "KIM" (MRN [id number]) as of 09/03/2018 16:22  Ref. Range 05/03/2018 13:48  Vitamin D, 25-Hydroxy Latest Ref Range: 30.0 - 100.0 ng/mL 44.1   I, Marcille Blanco, CMA, am acting as transcriptionist for Starlyn Skeans, MD I have reviewed the above documentation for accuracy and completeness, and I agree with the above. -Dennard Nip, MD

## 2018-09-05 ENCOUNTER — Other Ambulatory Visit: Payer: Self-pay | Admitting: Internal Medicine

## 2018-09-19 ENCOUNTER — Other Ambulatory Visit: Payer: Self-pay

## 2018-09-19 ENCOUNTER — Ambulatory Visit (INDEPENDENT_AMBULATORY_CARE_PROVIDER_SITE_OTHER): Payer: PPO | Admitting: Family Medicine

## 2018-09-19 ENCOUNTER — Encounter (INDEPENDENT_AMBULATORY_CARE_PROVIDER_SITE_OTHER): Payer: Self-pay | Admitting: Family Medicine

## 2018-09-19 DIAGNOSIS — Z6833 Body mass index (BMI) 33.0-33.9, adult: Secondary | ICD-10-CM | POA: Diagnosis not present

## 2018-09-19 DIAGNOSIS — R7303 Prediabetes: Secondary | ICD-10-CM

## 2018-09-19 DIAGNOSIS — E8881 Metabolic syndrome: Secondary | ICD-10-CM | POA: Diagnosis not present

## 2018-09-19 DIAGNOSIS — E669 Obesity, unspecified: Secondary | ICD-10-CM

## 2018-09-19 MED ORDER — METFORMIN HCL 500 MG PO TABS: 500.0000 mg | ORAL_TABLET | Freq: Two times a day (BID) | ORAL | 0 refills | Status: DC

## 2018-09-19 MED ORDER — BD PEN NEEDLE NANO 2ND GEN 32G X 4 MM MISC: 1.0000 | Freq: Two times a day (BID) | 0 refills | Status: DC

## 2018-09-19 MED ORDER — LIRAGLUTIDE 18 MG/3ML ~~LOC~~ SOPN: 1.2000 mg | PEN_INJECTOR | Freq: Every morning | SUBCUTANEOUS | 0 refills | Status: DC

## 2018-09-25 ENCOUNTER — Other Ambulatory Visit (INDEPENDENT_AMBULATORY_CARE_PROVIDER_SITE_OTHER): Payer: Self-pay

## 2018-09-25 DIAGNOSIS — E8881 Metabolic syndrome: Secondary | ICD-10-CM

## 2018-09-25 DIAGNOSIS — R7303 Prediabetes: Secondary | ICD-10-CM

## 2018-09-25 MED ORDER — LIRAGLUTIDE 18 MG/3ML ~~LOC~~ SOPN: 1.2000 mg | PEN_INJECTOR | Freq: Every morning | SUBCUTANEOUS | 0 refills | Status: DC

## 2018-09-25 MED ORDER — METFORMIN HCL 500 MG PO TABS: 500.0000 mg | ORAL_TABLET | Freq: Two times a day (BID) | ORAL | 0 refills | Status: DC

## 2018-09-25 NOTE — Progress Notes (Signed)
Office: [phone]  /  Fax: [phone] TeleHealth Visit:  BREONNA [name] has verbally consented to this TeleHealth visit today. The patient is located at home, the provider is located at the [facility] office. The participants in this visit include the listed provider and patient. The visit was conducted today via Face Time.  HPI:   Chief Complaint: OBESITY [name] is here to discuss her progress with her obesity treatment plan. She is keeping a food journal with 1500 to 1600 calories and 100 grams of protein and is following her eating plan approximately 85 % of the time. She states she is building a fence outside. [name] continues to do well maintaining her weight. She is doing home improvement projects for exercises. [name] is working on [name] and has reduced eating out, but still struggles with boredom eating at times.  We were unable to weigh the patient today for this TeleHealth visit. She feels as if she has maintained weight since her last visit. She has lost 20 lbs since starting treatment with [place].  Pre-Diabetes [name] has a diagnosis of pre-diabetes based on her elevated Hgb A1c of 5.4 on 05/03/18 and was informed this puts her at greater risk of developing diabetes. She is stable on her diet and medications currently. She is working on reducing simple carbs in her diet and being mindful of what she eats to decrease risk of diabetes. She denies nausea, vomiting, or hypoglycemia.   ASSESSMENT AND PLAN:  Insulin resistance - Plan: Insulin Pen Needle (BD PEN NEEDLE NANO 2ND GEN) 32G X 4 MM MISC, liraglutide (VICTOZA) 18 MG/3ML SOPN  Prediabetes - Plan: metFORMIN (GLUCOPHAGE) 500 MG tablet  Class 1 obesity with serious comorbidity and body mass index (BMI) of 33.0 to 33.9 in adult, unspecified obesity type  PLAN:  Pre-Diabetes [name] will continue to work on weight loss, exercise, and decreasing simple carbohydrates in her diet to help decrease  the risk of diabetes. She was informed that eating too many simple carbohydrates or too many calories at one sitting increases the likelihood of GI side effects. [name] agreed to continue metformin 500 mg BID #60 and Victoza 0.2 mLs [place] qd #2 pens with nano needles #100 with no refills and a prescription was written today. [name] agreed to follow up with [place] as directed to monitor her progress in 2 weeks.   Obesity [name] is currently in the action stage of change. As such, her goal is to continue with weight loss efforts. She has agreed to keep a food journal with 1500 to 1600 calories and 100 grams of protein.  [name] has been instructed to work up to a goal of 150 minutes of combined cardio and strengthening exercise per week for weight loss and overall health benefits. We discussed the following Behavioral Modification Strategies today: increasing vegetables, keep a strict food journal, and ways to avoid boredom eating.  [name] has agreed to follow up with our clinic in 2 weeks. She was informed of the importance of frequent follow up visits to maximize her success with intensive lifestyle modifications for her multiple health conditions.  ALLERGIES: Allergies  Allergen Reactions  . Chloraprep One Step [Chlorhexidine Gluconate] Rash    Developed severe rash where chloraprep was used on chest area  . Ivp Dye [Iodinated Diagnostic Agents] Rash and Other (See Comments)    Flushing, minor facial rash & dyspnea  . Nalbuphine Shortness Of Breath and Rash    Nubain caused respiratory distress & rash  .  Septra [Sulfamethoxazole-Trimethoprim] Shortness Of Breath and Rash  . Sulfamethoxazole-Trimethoprim Rash    rash  . Tylox [Oxycodone-Acetaminophen] Rash  . Augmentin [Amoxicillin-Pot Clavulanate] Nausea And Vomiting    MEDICATIONS: Current Outpatient Medications on File Prior to Visit  Medication Sig Dispense Refill  . aspirin 81 MG tablet Take 81 mg by mouth daily.     [name] buPROPion  (WELLBUTRIN SR) 150 MG 12 hr tablet Take 1 tablet (150 mg total) by mouth daily. 30 tablet 0  . citalopram (CELEXA) 20 MG tablet TAKE 1 TABLET BY MOUTH EVERY DAY 90 tablet 1  . clobetasol cream (TEMOVATE) 0.93 % Apply 1 application topically 2 (two) times daily. 30 g 0  . famotidine (PEPCID) 40 MG tablet Take 1 tablet (40 mg total) by mouth daily. 90 tablet 1  . Fluticasone-Salmeterol (ADVAIR DISKUS) 100-50 MCG/DOSE AEPB Inhale 1 puff into the lungs 2 (two) times daily. 60 each 5  . Fluticasone-Salmeterol (ADVAIR DISKUS) 250-50 MCG/DOSE AEPB Inhale 1 puff into the lungs 2 (two) times daily. 1 each 3  . hydrochlorothiazide (MICROZIDE) 12.5 MG capsule Take 1 capsule by mouth daily. 90 capsule 1  . linaclotide (LINZESS) 72 MCG capsule Take 1 capsule (72 mcg total) by mouth daily before breakfast. 30 capsule 5  . lisinopril (ZESTRIL) 40 MG tablet TAKE 1 TABLET BY MOUTH EVERY DAY 90 tablet 1  . montelukast (SINGULAIR) 10 MG tablet Take 1 tablet (10 mg total) by mouth at bedtime. 90 tablet 1  . nitrofurantoin, macrocrystal-monohydrate, (MACROBID) 100 MG capsule Take 1 capsule (100 mg total) by mouth 2 (two) times daily. 14 capsule 0  . polyethylene glycol powder (GLYCOLAX/MIRALAX) powder Take 17 g by mouth daily. 3350 g 0  . PROAIR HFA 108 (90 Base) MCG/ACT inhaler TAKE 2 PUFFS BY MOUTH EVERY 6 HOURS AS NEEDED FOR WHEEZE OR SHORTNESS OF BREATH 8.5 Inhaler 5  . Vitamin D, Ergocalciferol, (DRISDOL) 1.25 MG (50000 UT) CAPS capsule Take 1 capsule (50,000 Units total) by mouth every 7 (seven) days. 4 capsule 0   No current facility-administered medications on file prior to visit.     PAST MEDICAL HISTORY: Past Medical History:  Diagnosis Date  . Anemia   . Anxiety   . Arthritis    inflammitory  . Asthma   . B12 deficiency   . Back pain   . Chronic joint pain   . Constipation   . DDD (degenerative disc disease), lumbar    low back and neck and shoulders  . Depression    during divorce & legal  matters  . Fatty liver   . Fibromyalgia   . Gallbladder problem   . GERD (gastroesophageal reflux disease)   . History of kidney stones    Dr Phebe Colla  . History of polycystic ovarian disease S/P BSO  . Hx of anxiety disorder   . Hypertension    a  . Hypothyroidism    no meds  . IBS (irritable bowel syndrome)   . IBS (irritable bowel syndrome)   . Infertility, female   . Joint pain   . Kidney problem   . Lactose intolerance   . Lupus (HCC) hx positive ANA   followed by Dr Gavin Pound; possible lupus  . Multiple food allergies   . Myalgia   . Osteoarthritis   . 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 POLYCYSTIC OVARIAN DISEASE 03/02/2007   Qualifier: Diagnosis of  By: Linna Darner MD, Rae Mar BSO for cysts & endometriosis; Dr Reino Kent, [place] Seeing  Dr Paula Compton    . PONV (postoperative nausea and vomiting)   . Prediabetes   . Sweating profusely   . Symptomatic mammary hypertrophy   . URI (upper respiratory infection)    currently on cefdinir    PAST SURGICAL HISTORY: Past Surgical History:  Procedure Laterality Date  . [place]   exploratory lap  . BREAST REDUCTION SURGERY Bilateral 03/26/2015   Procedure: BILATERAL BREAST REDUCTION ;  Surgeon: Wallace Going, DO;  Location: [facility];  Service: Plastics;  Laterality: Bilateral;  . CARPAL TUNNEL RELEASE     bilateral; Dr Sherwood Gambler  . COLONOSCOPY     in 1990s  . CYSTO/ RIGHT RETROGRADE URETERAL PYELOGRAM  07-09-2004   HX BILATERAL RENAL STONES/ RIGHT FLANK PAIN  . CYSTOSCOPY W/ URETERAL STENT PLACEMENT  11/12/2011   Procedure: CYSTOSCOPY WITH RETROGRADE PYELOGRAM/URETERAL STENT PLACEMENT;  Surgeon: Alexis Frock, MD;  Location: [facility];  Service: Urology;  Laterality: Right;  . CYSTOSCOPY W/ URETERAL STENT REMOVAL  12/07/2011   Procedure: CYSTOSCOPY WITH STENT REMOVAL;  Surgeon: Alexis Frock, MD;  Location: [facility];  Service: Urology;   Laterality: Right;  . CYSTOSCOPY/RETROGRADE/URETEROSCOPY/STONE EXTRACTION WITH BASKET  12/07/2011   Procedure: CYSTOSCOPY/RETROGRADE/URETEROSCOPY/STONE EXTRACTION WITH BASKET;  Surgeon: Alexis Frock, MD;  Location: [facility];  Service: Urology;  Laterality: Right;  . DILATION AND CURETTAGE OF UTERUS     X5  . EXCISION LEFT BARTHOLIN GLAND  02-05-2002  . EYE SURGERY     Retinal tears surgery bilateral  . LAPAROSCOPIC ASSISTED VAGINAL HYSTERECTOMY  2000  . LAPAROSCOPIC CHOLECYSTECTOMY  05-31-2007  . LAPAROSCOPIC LASER ABLATION ENDOMETRIOSIS AND LEFT SALPINGO-OOPHORECTOMY  11-03-1999  . LAPAROSCOPY WITH RIGHT SALPINGO-OOPHECTOMY/ LYSIS ADHESIONS AND ABLATION ENDOMETRIOSIS  05-17-2001  . LIPOSUCTION Bilateral 03/26/2015   Procedure: WITH LIPOSUCTION;  Surgeon: Wallace Going, DO;  Location: [facility];  Service: Plastics;  Laterality: Bilateral;  . LUMBAR LAMINECTOMY  2003   L4 - L5  . PLANTAR FASCIA SURGERY     right  . PLANTAR FASCIA SURGERY  02/2011   left  . RIGHT URETEROSCOPIC STONE EXTRACTION  08-04-2000  . TONSILLECTOMY    . URETERAL STENT PLACEMENT  11/12/11   Dr Tresa Moore    SOCIAL HISTORY: Social History   Tobacco Use  . Smoking status: Never Smoker  . Smokeless tobacco: Never Used  Substance Use Topics  . Alcohol use: Yes    Alcohol/week: 1.0 standard drinks    Types: 1 Shots of liquor per week    Comment:  very rarely  . Drug use: No    FAMILY HISTORY: Family History  Problem Relation Age of Onset  . Hypertension Mother   . Colon polyps Mother   . Atrial fibrillation Mother        Dr Angelena Form  . Hyperlipidemia Mother   . Heart disease Mother   . Anxiety disorder Mother   . Sleep apnea Mother   . Obesity Mother   . Heart attack Father 80  . Diabetes Father   . Hypertension Father   . Hyperlipidemia Father   . Heart disease Father   . Stroke Father   . Obesity Father   . Hypertension Sister   . Heart attack Paternal  Grandmother 43  . Breast cancer Paternal Aunt   . Colon cancer Maternal Uncle   . Colon cancer Paternal Uncle   . Colon polyps Maternal Grandmother   .  Stroke Maternal Grandmother 76  . Diabetes Paternal Grandfather   . Stroke Maternal Grandfather 37    ROS: Review of Systems  Gastrointestinal: Negative for nausea and vomiting.  Endo/Heme/Allergies:       Negative for hypoglycemia.    PHYSICAL EXAM: Pt in no acute distress  RECENT LABS AND TESTS: BMET    Component Value Date/Time   NA 142 05/03/2018 1348   K 4.1 05/03/2018 1348   CL 106 05/03/2018 1348   CO2 22 05/03/2018 1348   GLUCOSE 76 05/03/2018 1348   GLUCOSE 92 08/16/2016 1011   BUN 22 05/03/2018 1348   CREATININE 0.86 05/03/2018 1348   CALCIUM 9.4 05/03/2018 1348   GFRNONAA 80 05/03/2018 1348   GFRAA 92 05/03/2018 1348   Lab Results  Component Value Date   HGBA1C 5.4 05/03/2018   HGBA1C 5.4 01/09/2018   HGBA1C 5.8 (H) 07/04/2017   HGBA1C 5.6 02/06/2017   HGBA1C 5.6 09/28/2016   Lab Results  Component Value Date   INSULIN 17.8 05/03/2018   INSULIN 15.3 01/09/2018   INSULIN 10.4 07/04/2017   INSULIN 28.1 (H) 02/06/2017   INSULIN 14.7 09/28/2016   CBC    Component Value Date/Time   WBC 7.6 01/09/2018 1338   WBC 6.6 08/16/2016 1011   RBC 4.69 01/09/2018 1338   RBC 4.68 08/16/2016 1011   HGB 13.1 01/09/2018 1338   HCT 39.5 01/09/2018 1338   PLT 235.0 08/16/2016 1011   MCV 84 01/09/2018 1338   MCH 27.9 01/09/2018 1338   MCH 27.0 04/19/2015 0650   MCHC 33.2 01/09/2018 1338   MCHC 33.4 08/16/2016 1011   RDW 14.7 01/09/2018 1338   LYMPHSABS 2.4 01/09/2018 1338   MONOABS 0.6 08/16/2016 1011   EOSABS 0.2 01/09/2018 1338   BASOSABS 0.1 01/09/2018 1338   Iron/TIBC/Ferritin/ %Sat No results found for: IRON, TIBC, FERRITIN, IRONPCTSAT Lipid Panel     Component Value Date/Time   CHOL 157 01/09/2018 1338   TRIG 84 01/09/2018 1338   HDL 58 01/09/2018 1338   CHOLHDL 3 08/16/2016 1011   VLDL 12.2  08/16/2016 1011   LDLCALC 82 01/09/2018 1338   Hepatic Function Panel     Component Value Date/Time   PROT 6.6 05/03/2018 1348   ALBUMIN 4.2 05/03/2018 1348   AST 23 05/03/2018 1348   ALT 20 05/03/2018 1348   ALKPHOS 90 05/03/2018 1348   BILITOT <0.2 05/03/2018 1348   BILIDIR 0.1 06/12/2013 1632   IBILI 0.2 06/11/2009 2309      Component Value Date/Time   TSH 1.840 01/09/2018 1338   TSH 2.100 07/04/2017 1216   TSH 1.54 08/16/2016 1011    Results for Difrancesco, Shaheen E "KIM" (MRN [id number]) as of 09/25/2018 12:24  Ref. Range 05/03/2018 13:48  Vitamin D, 25-Hydroxy Latest Ref Range: 30.0 - 100.0 ng/mL 44.1    I, Marcille Blanco, CMA, am acting as transcriptionist for Starlyn Skeans, MD I have reviewed the above documentation for accuracy and completeness, and I agree with the above. -Dennard Nip, MD

## 2018-09-27 ENCOUNTER — Encounter (INDEPENDENT_AMBULATORY_CARE_PROVIDER_SITE_OTHER): Payer: Self-pay | Admitting: Family Medicine

## 2018-10-03 ENCOUNTER — Other Ambulatory Visit (INDEPENDENT_AMBULATORY_CARE_PROVIDER_SITE_OTHER): Payer: Self-pay

## 2018-10-03 ENCOUNTER — Telehealth (INDEPENDENT_AMBULATORY_CARE_PROVIDER_SITE_OTHER): Payer: Self-pay | Admitting: Family Medicine

## 2018-10-03 DIAGNOSIS — E8881 Metabolic syndrome: Secondary | ICD-10-CM

## 2018-10-03 MED ORDER — LIRAGLUTIDE 18 MG/3ML ~~LOC~~ SOPN: 1.2000 mg | PEN_INJECTOR | Freq: Every morning | SUBCUTANEOUS | 0 refills | Status: DC

## 2018-10-03 NOTE — Telephone Encounter (Signed)
Patient has made several attempts through My Chart & phone calls to get refills for Victoza & needles for the Victoza.  Patient states she only has enough for to get her through 1 day (7/2).  Please advise patient.  Thank you

## 2018-10-03 NOTE — Telephone Encounter (Signed)
We have sent the Victoza to [facility].  The last prescription was sent to [facility].   Have a great day,   Mabelle Mungin R CMA

## 2018-10-04 ENCOUNTER — Other Ambulatory Visit (INDEPENDENT_AMBULATORY_CARE_PROVIDER_SITE_OTHER): Payer: Self-pay | Admitting: Family Medicine

## 2018-10-04 DIAGNOSIS — E8881 Metabolic syndrome: Secondary | ICD-10-CM

## 2018-10-10 ENCOUNTER — Telehealth (INDEPENDENT_AMBULATORY_CARE_PROVIDER_SITE_OTHER): Payer: PPO | Admitting: Family Medicine

## 2018-10-10 ENCOUNTER — Other Ambulatory Visit: Payer: Self-pay

## 2018-10-10 ENCOUNTER — Encounter (INDEPENDENT_AMBULATORY_CARE_PROVIDER_SITE_OTHER): Payer: Self-pay | Admitting: Family Medicine

## 2018-10-10 DIAGNOSIS — R7303 Prediabetes: Secondary | ICD-10-CM

## 2018-10-10 DIAGNOSIS — E669 Obesity, unspecified: Secondary | ICD-10-CM

## 2018-10-10 DIAGNOSIS — Z6833 Body mass index (BMI) 33.0-33.9, adult: Secondary | ICD-10-CM | POA: Diagnosis not present

## 2018-10-11 NOTE — Progress Notes (Signed)
Office: [phone]  /  Fax: [phone] TeleHealth Visit:  [name] has verbally consented to this TeleHealth visit today. The patient is located at home, the provider is located at the [facility] office. The participants in this visit include the listed provider and patient and any and all parties involved. The visit was conducted today via FaceTime.  HPI:   Chief Complaint: OBESITY [name] is here to discuss her progress with her obesity treatment plan. She is on the keep a food journal with 1200 to 1400 calories and 75 grams of protein daily plan and is following her eating plan approximately 75 % of the time. She states she is doing yard work occasionally for exercise. [name] states she is maintaining her weight at 224 pounds for the last few weeks. She is journaling on and off and she has tried to increase activity, but no formal exercise. [name] is struggling to meet her protein goals, due to eating lighter during the summer.  We were unable to weigh the patient today for this TeleHealth visit. She feels as if she has maintained weight since her last visit. She has lost 17 lbs since starting treatment with [place].  Pre-Diabetes [name] has a diagnosis of prediabetes based on her elevated Hgb A1c and was informed this puts her at greater risk of developing diabetes. She is stable on metformin and Victoza. Jamariah continues to work on diet and exercise to decrease risk of diabetes. She denies nausea, vomiting or hypoglycemia. [name] is due for labs. [name] is doing well with diet, but not much exercise.  ASSESSMENT AND PLAN:  Prediabetes  Class 1 obesity with serious comorbidity and body mass index (BMI) of 33.0 to 33.9 in adult, unspecified obesity type  PLAN:  Pre-Diabetes Elmo will continue to work on weight loss, exercise, and decreasing simple carbohydrates in her diet to help decrease the risk of diabetes. We dicussed metformin including  benefits and risks. She was informed that eating too many simple carbohydrates or too many calories at one sitting increases the likelihood of GI side effects. [name] will continue her medications and increase her protein. We will check labs at the next visit in 3 weeks and [name] agreed to follow up with [place] as directed to monitor her progress.  I spent > than 50% of the 25 minute visit on counseling as documented in the note.  Obesity [name] is currently in the action stage of change. As such, her goal is to continue with weight loss efforts She has agreed to keep a food journal with 1200 to 1400 calories and 75+ grams of protein daily [name] has been instructed to work up to a goal of 150 minutes of combined cardio and strengthening exercise per week for weight loss and overall health benefits. We discussed the following Behavioral Modification Strategies today: increasing lean protein intake and increase H2O intake We discussed non meat options to help increase lean protein.  [name] has agreed to follow up with our clinic in 3 weeks. She was informed of the importance of frequent follow up visits to maximize her success with intensive lifestyle modifications for her multiple health conditions.  ALLERGIES: Allergies  Allergen Reactions  . Chloraprep One Step [Chlorhexidine Gluconate] Rash    Developed severe rash where chloraprep was used on chest area  . Ivp Dye [Iodinated Diagnostic Agents] Rash and Other (See Comments)    Flushing, minor facial rash & dyspnea  . Nalbuphine Shortness Of Breath and Rash  Nubain caused respiratory distress & rash  . Septra [Sulfamethoxazole-Trimethoprim] Shortness Of Breath and Rash  . Sulfamethoxazole-Trimethoprim Rash    rash  . Tylox [Oxycodone-Acetaminophen] Rash  . Augmentin [Amoxicillin-Pot Clavulanate] Nausea And Vomiting    MEDICATIONS: Current Outpatient Medications on File Prior to Visit  Medication Sig Dispense Refill  .  aspirin 81 MG tablet Take 81 mg by mouth daily.     . BD PEN NEEDLE NANO U/F 32G X 4 MM MISC USE 2 TIMES DAILY 100 each 0  . buPROPion (WELLBUTRIN SR) 150 MG 12 hr tablet Take 1 tablet (150 mg total) by mouth daily. 30 tablet 0  . citalopram (CELEXA) 20 MG tablet TAKE 1 TABLET BY MOUTH EVERY DAY 90 tablet 1  . clobetasol cream (TEMOVATE) 7.89 % Apply 1 application topically 2 (two) times daily. 30 g 0  . famotidine (PEPCID) 40 MG tablet Take 1 tablet (40 mg total) by mouth daily. 90 tablet 1  . Fluticasone-Salmeterol (ADVAIR DISKUS) 100-50 MCG/DOSE AEPB Inhale 1 puff into the lungs 2 (two) times daily. 60 each 5  . Fluticasone-Salmeterol (ADVAIR DISKUS) 250-50 MCG/DOSE AEPB Inhale 1 puff into the lungs 2 (two) times daily. 1 each 3  . hydrochlorothiazide (MICROZIDE) 12.5 MG capsule Take 1 capsule by mouth daily. 90 capsule 1  . linaclotide (LINZESS) 72 MCG capsule Take 1 capsule (72 mcg total) by mouth daily before breakfast. 30 capsule 5  . liraglutide (VICTOZA) 18 MG/3ML SOPN Inject 0.2 mLs (1.2 mg total) into the skin every morning. 6 pen 0  . lisinopril (ZESTRIL) 40 MG tablet TAKE 1 TABLET BY MOUTH EVERY DAY 90 tablet 1  . metFORMIN (GLUCOPHAGE) 500 MG tablet Take 1 tablet (500 mg total) by mouth 2 (two) times daily with a meal. 180 tablet 0  . montelukast (SINGULAIR) 10 MG tablet Take 1 tablet (10 mg total) by mouth at bedtime. 90 tablet 1  . nitrofurantoin, macrocrystal-monohydrate, (MACROBID) 100 MG capsule Take 1 capsule (100 mg total) by mouth 2 (two) times daily. 14 capsule 0  . polyethylene glycol powder (GLYCOLAX/MIRALAX) powder Take 17 g by mouth daily. 3350 g 0  . PROAIR HFA 108 (90 Base) MCG/ACT inhaler TAKE 2 PUFFS BY MOUTH EVERY 6 HOURS AS NEEDED FOR WHEEZE OR SHORTNESS OF BREATH 8.5 Inhaler 5  . Vitamin D, Ergocalciferol, (DRISDOL) 1.25 MG (50000 UT) CAPS capsule Take 1 capsule (50,000 Units total) by mouth every 7 (seven) days. 4 capsule 0   No current facility-administered  medications on file prior to visit.     PAST MEDICAL HISTORY: Past Medical History:  Diagnosis Date  . Anemia   . Anxiety   . Arthritis    inflammitory  . Asthma   . B12 deficiency   . Back pain   . Chronic joint pain   . Constipation   . DDD (degenerative disc disease), lumbar    low back and neck and shoulders  . Depression    during divorce & legal matters  . Fatty liver   . Fibromyalgia   . Gallbladder problem   . GERD (gastroesophageal reflux disease)   . History of kidney stones    Dr Phebe Colla  . History of polycystic ovarian disease S/P BSO  . Hx of anxiety disorder   . Hypertension    a  . Hypothyroidism    no meds  . IBS (irritable bowel syndrome)   . IBS (irritable bowel syndrome)   . Infertility, female   . Joint pain   .  Kidney problem   . Lactose intolerance   . Lupus (HCC) hx positive ANA   followed by Dr Gavin Pound; possible lupus  . Multiple food allergies   . Myalgia   . Osteoarthritis   . 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 POLYCYSTIC OVARIAN DISEASE 03/02/2007   Qualifier: Diagnosis of  By: Linna Darner MD, Rae Mar BSO for cysts & endometriosis; Dr Reino Kent, Gyn Seeing Dr Paula Compton    . PONV (postoperative nausea and vomiting)   . Prediabetes   . Sweating profusely   . Symptomatic mammary hypertrophy   . URI (upper respiratory infection)    currently on cefdinir    PAST SURGICAL HISTORY: Past Surgical History:  Procedure Laterality Date  . [place]   exploratory lap  . BREAST REDUCTION SURGERY Bilateral 03/26/2015   Procedure: BILATERAL BREAST REDUCTION ;  Surgeon: Wallace Going, DO;  Location: [facility];  Service: Plastics;  Laterality: Bilateral;  . CARPAL TUNNEL RELEASE     bilateral; Dr Sherwood Gambler  . COLONOSCOPY     in 1990s  . CYSTO/ RIGHT RETROGRADE URETERAL PYELOGRAM  07-09-2004   HX BILATERAL RENAL STONES/ RIGHT FLANK PAIN  . CYSTOSCOPY W/ URETERAL STENT  PLACEMENT  11/12/2011   Procedure: CYSTOSCOPY WITH RETROGRADE PYELOGRAM/URETERAL STENT PLACEMENT;  Surgeon: Alexis Frock, MD;  Location: [facility];  Service: Urology;  Laterality: Right;  . CYSTOSCOPY W/ URETERAL STENT REMOVAL  12/07/2011   Procedure: CYSTOSCOPY WITH STENT REMOVAL;  Surgeon: Alexis Frock, MD;  Location: [facility];  Service: Urology;  Laterality: Right;  . CYSTOSCOPY/RETROGRADE/URETEROSCOPY/STONE EXTRACTION WITH BASKET  12/07/2011   Procedure: CYSTOSCOPY/RETROGRADE/URETEROSCOPY/STONE EXTRACTION WITH BASKET;  Surgeon: Alexis Frock, MD;  Location: [facility];  Service: Urology;  Laterality: Right;  . DILATION AND CURETTAGE OF UTERUS     X5  . EXCISION LEFT BARTHOLIN GLAND  02-05-2002  . EYE SURGERY     Retinal tears surgery bilateral  . LAPAROSCOPIC ASSISTED VAGINAL HYSTERECTOMY  2000  . LAPAROSCOPIC CHOLECYSTECTOMY  05-31-2007  . LAPAROSCOPIC LASER ABLATION ENDOMETRIOSIS AND LEFT SALPINGO-OOPHORECTOMY  11-03-1999  . LAPAROSCOPY WITH RIGHT SALPINGO-OOPHECTOMY/ LYSIS ADHESIONS AND ABLATION ENDOMETRIOSIS  05-17-2001  . LIPOSUCTION Bilateral 03/26/2015   Procedure: WITH LIPOSUCTION;  Surgeon: Wallace Going, DO;  Location: [place];  Service: Plastics;  Laterality: Bilateral;  . LUMBAR LAMINECTOMY  2003   L4 - L5  . PLANTAR FASCIA SURGERY     right  . PLANTAR FASCIA SURGERY  02/2011   left  . RIGHT URETEROSCOPIC STONE EXTRACTION  08-04-2000  . TONSILLECTOMY    . URETERAL STENT PLACEMENT  11/12/11   Dr Tresa Moore    SOCIAL HISTORY: Social History   Tobacco Use  . Smoking status: Never Smoker  . Smokeless tobacco: Never Used  Substance Use Topics  . Alcohol use: Yes    Alcohol/week: 1.0 standard drinks    Types: 1 Shots of liquor per week    Comment:  very rarely  . Drug use: No    FAMILY HISTORY: Family History  Problem Relation Age of Onset  . Hypertension Mother   . Colon polyps Mother   . Atrial fibrillation  Mother        Dr Angelena Form  . Hyperlipidemia Mother   . Heart disease Mother   . Anxiety disorder Mother   . Sleep apnea Mother   . Obesity Mother   . Heart attack Father 47  .  Diabetes Father   . Hypertension Father   . Hyperlipidemia Father   . Heart disease Father   . Stroke Father   . Obesity Father   . Hypertension Sister   . Heart attack Paternal Grandmother 61  . Breast cancer Paternal Aunt   . Colon cancer Maternal Uncle   . Colon cancer Paternal Uncle   . Colon polyps Maternal Grandmother   . Stroke Maternal Grandmother 76  . Diabetes Paternal Grandfather   . Stroke Maternal Grandfather 37    ROS: Review of Systems  Constitutional: Negative for weight loss.  Gastrointestinal: Negative for nausea and vomiting.  Endo/Heme/Allergies:       Negative for hypoglycemia    PHYSICAL EXAM: Pt in no acute distress  RECENT LABS AND TESTS: BMET    Component Value Date/Time   NA 142 05/03/2018 1348   K 4.1 05/03/2018 1348   CL 106 05/03/2018 1348   CO2 22 05/03/2018 1348   GLUCOSE 76 05/03/2018 1348   GLUCOSE 92 08/16/2016 1011   BUN 22 05/03/2018 1348   CREATININE 0.86 05/03/2018 1348   CALCIUM 9.4 05/03/2018 1348   GFRNONAA 80 05/03/2018 1348   GFRAA 92 05/03/2018 1348   Lab Results  Component Value Date   HGBA1C 5.4 05/03/2018   HGBA1C 5.4 01/09/2018   HGBA1C 5.8 (H) 07/04/2017   HGBA1C 5.6 02/06/2017   HGBA1C 5.6 09/28/2016   Lab Results  Component Value Date   INSULIN 17.8 05/03/2018   INSULIN 15.3 01/09/2018   INSULIN 10.4 07/04/2017   INSULIN 28.1 (H) 02/06/2017   INSULIN 14.7 09/28/2016   CBC    Component Value Date/Time   WBC 7.6 01/09/2018 1338   WBC 6.6 08/16/2016 1011   RBC 4.69 01/09/2018 1338   RBC 4.68 08/16/2016 1011   HGB 13.1 01/09/2018 1338   HCT 39.5 01/09/2018 1338   PLT 235.0 08/16/2016 1011   MCV 84 01/09/2018 1338   MCH 27.9 01/09/2018 1338   MCH 27.0 04/19/2015 0650   MCHC 33.2 01/09/2018 1338   MCHC 33.4 08/16/2016  1011   RDW 14.7 01/09/2018 1338   LYMPHSABS 2.4 01/09/2018 1338   MONOABS 0.6 08/16/2016 1011   EOSABS 0.2 01/09/2018 1338   BASOSABS 0.1 01/09/2018 1338   Iron/TIBC/Ferritin/ %Sat No results found for: IRON, TIBC, FERRITIN, IRONPCTSAT Lipid Panel     Component Value Date/Time   CHOL 157 01/09/2018 1338   TRIG 84 01/09/2018 1338   HDL 58 01/09/2018 1338   CHOLHDL 3 08/16/2016 1011   VLDL 12.2 08/16/2016 1011   LDLCALC 82 01/09/2018 1338   Hepatic Function Panel     Component Value Date/Time   PROT 6.6 05/03/2018 1348   ALBUMIN 4.2 05/03/2018 1348   AST 23 05/03/2018 1348   ALT 20 05/03/2018 1348   ALKPHOS 90 05/03/2018 1348   BILITOT <0.2 05/03/2018 1348   BILIDIR 0.1 06/12/2013 1632   IBILI 0.2 06/11/2009 2309      Component Value Date/Time   TSH 1.840 01/09/2018 1338   TSH 2.100 07/04/2017 1216   TSH 1.54 08/16/2016 1011     Ref. Range 05/03/2018 13:48  Vitamin D, 25-Hydroxy Latest Ref Range: 30.0 - 100.0 ng/mL 44.1    I, Doreene Nest, am acting as Location manager for Dennard Nip, MD I have reviewed the above documentation for accuracy and completeness, and I agree with the above. -Dennard Nip, MD

## 2018-10-18 DIAGNOSIS — M7522 Bicipital tendinitis, left shoulder: Secondary | ICD-10-CM | POA: Diagnosis not present

## 2018-10-18 DIAGNOSIS — M7521 Bicipital tendinitis, right shoulder: Secondary | ICD-10-CM | POA: Diagnosis not present

## 2018-10-29 ENCOUNTER — Encounter (INDEPENDENT_AMBULATORY_CARE_PROVIDER_SITE_OTHER): Payer: Self-pay | Admitting: Family Medicine

## 2018-10-29 ENCOUNTER — Other Ambulatory Visit: Payer: Self-pay

## 2018-10-29 ENCOUNTER — Ambulatory Visit (INDEPENDENT_AMBULATORY_CARE_PROVIDER_SITE_OTHER): Payer: PPO | Admitting: Family Medicine

## 2018-10-29 VITALS — BP 121/77 | HR 73 | Temp 98.1°F | Ht 68.0 in | Wt 222.0 lb

## 2018-10-29 DIAGNOSIS — E7849 Other hyperlipidemia: Secondary | ICD-10-CM

## 2018-10-29 DIAGNOSIS — E559 Vitamin D deficiency, unspecified: Secondary | ICD-10-CM

## 2018-10-29 DIAGNOSIS — Z6833 Body mass index (BMI) 33.0-33.9, adult: Secondary | ICD-10-CM

## 2018-10-29 DIAGNOSIS — E669 Obesity, unspecified: Secondary | ICD-10-CM

## 2018-10-29 DIAGNOSIS — R7303 Prediabetes: Secondary | ICD-10-CM

## 2018-10-29 DIAGNOSIS — F3289 Other specified depressive episodes: Secondary | ICD-10-CM

## 2018-10-29 MED ORDER — BUPROPION HCL ER (SR) 150 MG PO TB12: 150.0000 mg | ORAL_TABLET | Freq: Every day | ORAL | 0 refills | Status: DC

## 2018-10-29 MED ORDER — METFORMIN HCL 500 MG PO TABS: 500.0000 mg | ORAL_TABLET | Freq: Two times a day (BID) | ORAL | 0 refills | Status: DC

## 2018-10-29 NOTE — Progress Notes (Signed)
Office: [phone]  /  Fax: [phone]   HPI:   Chief Complaint: OBESITY [name] is here to discuss her progress with her obesity treatment plan. She is on the keep a food journal with 1200-1400 calories and 75+ grams of protein daily and is following her eating plan approximately 75 % of the time. She states she is doing yard work. [name] continues to do well with journaling. She often goes over her calories and has been doing more comfort eating, especially with ice cream during the Summer.  Her weight is 222 lb (100.7 kg) today and has gained 1 lb since her last visit. She has lost 19 lbs since starting treatment with [place].  Insulin Resistance [name] has a diagnosis of insulin resistance based on her elevated fasting insulin level >5. Although [name]'s blood glucose readings are still under good control, insulin resistance puts her at greater risk of metabolic syndrome and diabetes. She is stable on metformin and denies nausea, vomiting, or hypoglycemia. She is doing well maintaining her weight, but still giving into sugary treats regularly. She continues to work on diet and exercise to decrease risk of diabetes.  Vitamin D Deficiency [name] has a diagnosis of vitamin D deficiency. She is stable on prescription Vit D and she is due for labs. She denies nausea, vomiting or muscle weakness.  Hyperlipidemia [name] has hyperlipidemia and has been attempting to improve her cholesterol levels with intensive lifestyle modification including a low saturated fat diet, exercise and weight loss. She denies any chest pain, claudication or myalgias.  Depression with Emotional Eating Behaviors [name]'s mood is stable on Wellbutrin. She is still struggling with some emotional eating and using food for comfort to the extent that it is negatively impacting her health. She often snacks when she is not hungry. [name] sometimes feels she is out of control and then feels guilty that she made  poor food choices. She has been working on behavior modification techniques to help reduce her emotional eating and has been somewhat successful. She shows no sign of suicidal or homicidal ideations.  Depression screen [facility] 2/9 09/28/2016 07/29/2015 09/22/2014  Decreased Interest 2 0 0  Down, Depressed, Hopeless 1 0 0  PHQ - 2 Score 3 0 0  Altered sleeping 1 - -  Tired, decreased energy 2 - -  Change in appetite 1 - -  Feeling bad or failure about yourself  1 - -  Trouble concentrating 2 - -  Moving slowly or fidgety/restless 1 - -  Suicidal thoughts 0 - -  PHQ-9 Score 11 - -  Some recent data might be hidden    ASSESSMENT AND PLAN:  Prediabetes - Plan: Hemoglobin A1c, Insulin, random, metFORMIN (GLUCOPHAGE) 500 MG tablet  Vitamin D deficiency - Plan: VITAMIN D 25 Hydroxy (Vit-D Deficiency, Fractures)  Other hyperlipidemia - Plan: Comprehensive metabolic panel, Lipid Panel With LDL/HDL Ratio  Other depression - with emotional eating - Plan: buPROPion (WELLBUTRIN SR) 150 MG 12 hr tablet  Class 1 obesity with serious comorbidity and body mass index (BMI) of 33.0 to 33.9 in adult, unspecified obesity type  PLAN:  Insulin Resistance [name] will continue to work on weight loss, exercise, and decreasing simple carbohydrates in her diet to help decrease the risk of diabetes. We dicussed metformin including benefits and risks. She was informed that eating too many simple carbohydrates or too many calories at one sitting increases the likelihood of GI side effects. [name] agrees to continue taking metformin 500 mg BID #180 90  day supply with no refills. We will check labs today. [name] agrees to follow up with our clinic in 3 to 4 weeks as directed to monitor her progress.  Vitamin D Deficiency [name] was informed that low vitamin D levels contributes to fatigue and are associated with obesity, breast, and colon cancer. [name] agrees to continue taking prescription Vit D 50,000 IU  every week and will follow up for routine testing of vitamin D, at least 2-3 times per year. She was informed of the risk of over-replacement of vitamin D and agrees to not increase her dose unless she discusses this with [place] first. We will check labs today. [name] agrees to follow up with our clinic in 3 to 4 weeks.  Hyperlipidemia [name] was informed of the American Heart Association Guidelines emphasizing intensive lifestyle modifications as the first line treatment for hyperlipidemia. We discussed many lifestyle modifications today in depth, and [name] will continue to work on decreasing saturated fats such as fatty red meat, butter and many fried foods. She will also increase vegetables and lean protein in her diet and continue to work on diet, exercise, and weight loss efforts. We will check labs today. [name] agrees to follow up with our clinic in 3 to 4 weeks.  Depression with Emotional Eating Behaviors We discussed behavior modification techniques today to help [name] deal with her emotional eating and depression. [name] agrees to continue taking Wellbutrin SR 150 mg qd #30 and we will refill for 1 month. [name] agrees to follow up with our clinic in 3 to 4 weeks.  Obesity [name] is currently in the action stage of change. As such, her goal is to continue with weight loss efforts She has agreed to keep a food journal with 1200-1400 calories and 75+ grams of protein daily [name] has been instructed to work up to a goal of 150 minutes of combined cardio and strengthening exercise per week for weight loss and overall health benefits. We discussed the following Behavioral Modification Strategies today: decreasing simple carbohydrates  and emotional eating strategies   [name] has agreed to follow up with our clinic in 3 to 4 weeks. She was informed of the importance of frequent follow up visits to maximize her success with intensive lifestyle modifications for her multiple  health conditions.  ALLERGIES: Allergies  Allergen Reactions   Chloraprep One Step [Chlorhexidine Gluconate] Rash    Developed severe rash where chloraprep was used on chest area   Ivp Dye [Iodinated Diagnostic Agents] Rash and Other (See Comments)    Flushing, minor facial rash & dyspnea   Nalbuphine Shortness Of Breath and Rash    Nubain caused respiratory distress & rash   Septra [Sulfamethoxazole-Trimethoprim] Shortness Of Breath and Rash   Sulfamethoxazole-Trimethoprim Rash    rash   Tylox [Oxycodone-Acetaminophen] Rash   Augmentin [Amoxicillin-Pot Clavulanate] Nausea And Vomiting    MEDICATIONS: Current Outpatient Medications on File Prior to Visit  Medication Sig Dispense Refill   aspirin 81 MG tablet Take 81 mg by mouth daily.      BD PEN NEEDLE NANO U/F 32G X 4 MM MISC USE 2 TIMES DAILY 100 each 0   citalopram (CELEXA) 20 MG tablet TAKE 1 TABLET BY MOUTH EVERY [name] 90 tablet 1   clobetasol cream (TEMOVATE) 4.56 % Apply 1 application topically 2 (two) times daily. 30 g 0   famotidine (PEPCID) 40 MG tablet Take 1 tablet (40 mg total) by mouth daily. 90 tablet 1   Fluticasone-Salmeterol (ADVAIR DISKUS) 100-50 MCG/DOSE AEPB  Inhale 1 puff into the lungs 2 (two) times daily. 60 each 5   Fluticasone-Salmeterol (ADVAIR DISKUS) 250-50 MCG/DOSE AEPB Inhale 1 puff into the lungs 2 (two) times daily. 1 each 3   hydrochlorothiazide (MICROZIDE) 12.5 MG capsule Take 1 capsule by mouth daily. 90 capsule 1   linaclotide (LINZESS) 72 MCG capsule Take 1 capsule (72 mcg total) by mouth daily before breakfast. 30 capsule 5   liraglutide (VICTOZA) 18 MG/3ML SOPN Inject 0.2 mLs (1.2 mg total) into the skin every morning. 6 pen 0   lisinopril (ZESTRIL) 40 MG tablet TAKE 1 TABLET BY MOUTH EVERY [name] 90 tablet 1   montelukast (SINGULAIR) 10 MG tablet Take 1 tablet (10 mg total) by mouth at bedtime. 90 tablet 1   nitrofurantoin, macrocrystal-monohydrate, (MACROBID) 100 MG capsule  Take 1 capsule (100 mg total) by mouth 2 (two) times daily. 14 capsule 0   polyethylene glycol powder (GLYCOLAX/MIRALAX) powder Take 17 g by mouth daily. 3350 g 0   PROAIR HFA 108 (90 Base) MCG/ACT inhaler TAKE 2 PUFFS BY MOUTH EVERY 6 HOURS AS NEEDED FOR WHEEZE OR SHORTNESS OF BREATH 8.5 Inhaler 5   Vitamin D, Ergocalciferol, (DRISDOL) 1.25 MG (50000 UT) CAPS capsule Take 1 capsule (50,000 Units total) by mouth every 7 (seven) days. 4 capsule 0   No current facility-administered medications on file prior to visit.     PAST MEDICAL HISTORY: Past Medical History:  Diagnosis Date   Anemia    Anxiety    Arthritis    inflammitory   Asthma    B12 deficiency    Back pain    Chronic joint pain    Constipation    DDD (degenerative disc disease), lumbar    low back and neck and shoulders   Depression    during divorce & legal matters   Fatty liver    Fibromyalgia    Gallbladder problem    GERD (gastroesophageal reflux disease)    History of kidney stones    Dr Phebe Colla   History of polycystic ovarian disease S/P BSO   Hx of anxiety disorder    Hypertension    a   Hypothyroidism    no meds   IBS (irritable bowel syndrome)    IBS (irritable bowel syndrome)    Infertility, female    Joint pain    Kidney problem    Lactose intolerance    Lupus (HCC) hx positive ANA   followed by Dr Gavin Pound; possible lupus   Multiple food allergies    Myalgia    Osteoarthritis    Polyarthritis, 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[place])    POLYCYSTIC OVARIAN DISEASE 03/02/2007   Qualifier: Diagnosis of  By: Linna Darner MD, Rae Mar BSO for cysts & endometriosis; Dr Reino Kent, Gyn Seeing Dr Paula Compton     PONV (postoperative nausea and vomiting)    Prediabetes    Sweating profusely    Symptomatic mammary hypertrophy    URI (upper respiratory infection)    currently on cefdinir    PAST SURGICAL HISTORY: Past Surgical History:  Procedure Laterality Date   ANKLE  SURGERY     X3   APPENDECTOMY  1991   exploratory lap   BREAST REDUCTION SURGERY Bilateral 03/26/2015   Procedure: BILATERAL BREAST REDUCTION ;  Surgeon: Wallace Going, DO;  Location: [facility];  Service: Plastics;  Laterality: Bilateral;   CARPAL TUNNEL RELEASE     bilateral; Dr Sherwood Gambler   COLONOSCOPY     in  1990s   CYSTO/ RIGHT RETROGRADE URETERAL PYELOGRAM  07-09-2004   HX BILATERAL RENAL STONES/ RIGHT FLANK PAIN   CYSTOSCOPY W/ URETERAL STENT PLACEMENT  11/12/2011   Procedure: CYSTOSCOPY WITH RETROGRADE PYELOGRAM/URETERAL STENT PLACEMENT;  Surgeon: Alexis Frock, MD;  Location: [facility];  Service: Urology;  Laterality: Right;   CYSTOSCOPY W/ URETERAL STENT REMOVAL  12/07/2011   Procedure: CYSTOSCOPY WITH STENT REMOVAL;  Surgeon: Alexis Frock, MD;  Location: [facility];  Service: Urology;  Laterality: Right;   CYSTOSCOPY/RETROGRADE/URETEROSCOPY/STONE EXTRACTION WITH BASKET  12/07/2011   Procedure: CYSTOSCOPY/RETROGRADE/URETEROSCOPY/STONE EXTRACTION WITH BASKET;  Surgeon: Alexis Frock, MD;  Location: [facility];  Service: Urology;  Laterality: Right;   DILATION AND CURETTAGE OF UTERUS     X5   EXCISION LEFT BARTHOLIN GLAND  02-05-2002   EYE SURGERY     Retinal tears surgery bilateral   LAPAROSCOPIC ASSISTED VAGINAL HYSTERECTOMY  2000   LAPAROSCOPIC CHOLECYSTECTOMY  05-31-2007   LAPAROSCOPIC LASER ABLATION ENDOMETRIOSIS AND LEFT SALPINGO-OOPHORECTOMY  11-03-1999   LAPAROSCOPY WITH RIGHT SALPINGO-OOPHECTOMY/ LYSIS ADHESIONS AND ABLATION ENDOMETRIOSIS  05-17-2001   LIPOSUCTION Bilateral 03/26/2015   Procedure: WITH LIPOSUCTION;  Surgeon: Wallace Going, DO;  Location: [facility];  Service: Plastics;  Laterality: Bilateral;   LUMBAR LAMINECTOMY  2003   L4 - L5   PLANTAR FASCIA SURGERY     right   PLANTAR FASCIA SURGERY  02/2011   left   RIGHT URETEROSCOPIC STONE EXTRACTION  08-04-2000    TONSILLECTOMY     URETERAL STENT PLACEMENT  11/12/11   Dr Tresa Moore    SOCIAL HISTORY: Social History   Tobacco Use   Smoking status: Never Smoker   Smokeless tobacco: Never Used  Substance Use Topics   Alcohol use: Yes    Alcohol/week: 1.0 standard drinks    Types: 1 Shots of liquor per week    Comment:  very rarely   Drug use: No    FAMILY HISTORY: Family History  Problem Relation Age of Onset   Hypertension Mother    Colon polyps Mother    Atrial fibrillation Mother        Dr Angelena Form   Hyperlipidemia Mother    Heart disease Mother    Anxiety disorder Mother    Sleep apnea Mother    Obesity Mother    Heart attack Father 60   Diabetes Father    Hypertension Father    Hyperlipidemia Father    Heart disease Father    Stroke Father    Obesity Father    Hypertension Sister    Heart attack Paternal Grandmother 50   Breast cancer Paternal Aunt    Colon cancer Maternal Uncle    Colon cancer Paternal Uncle    Colon polyps Maternal Grandmother    Stroke Maternal Grandmother 76   Diabetes Paternal Grandfather    Stroke Maternal Grandfather 37    ROS: Review of Systems  Constitutional: Negative for weight loss.  Cardiovascular: Negative for chest pain and claudication.  Gastrointestinal: Negative for nausea and vomiting.  Musculoskeletal: Negative for myalgias.       Negative muscle weakness  Endo/Heme/Allergies:       Negative hypoglycemia  Psychiatric/Behavioral: Positive for depression. Negative for suicidal ideas.    PHYSICAL EXAM: Blood pressure 121/77, pulse 73, temperature 98.1 F (36.7 C), temperature source Oral, height 5\' 8"  (1.727 m), weight 222 lb (100.7 kg), SpO2 99 %. Body mass index is 33.75 kg/m. Physical Exam Vitals signs reviewed.  Constitutional:  Appearance: Normal appearance. She is obese.  Cardiovascular:     Rate and Rhythm: Normal rate.     Pulses: Normal pulses.  Pulmonary:     Effort: Pulmonary  effort is normal.     Breath sounds: Normal breath sounds.  Musculoskeletal: Normal range of motion.  Skin:    General: Skin is warm and dry.  Neurological:     Mental Status: She is alert and oriented to person, place, and time.  Psychiatric:        Mood and Affect: Mood normal.        Behavior: Behavior normal.     RECENT LABS AND TESTS: BMET    Component Value Date/Time   NA 142 05/03/2018 1348   K 4.1 05/03/2018 1348   CL 106 05/03/2018 1348   CO2 22 05/03/2018 1348   GLUCOSE 76 05/03/2018 1348   GLUCOSE 92 08/16/2016 1011   BUN 22 05/03/2018 1348   CREATININE 0.86 05/03/2018 1348   CALCIUM 9.4 05/03/2018 1348   GFRNONAA 80 05/03/2018 1348   GFRAA 92 05/03/2018 1348   Lab Results  Component Value Date   HGBA1C 5.4 05/03/2018   HGBA1C 5.4 01/09/2018   HGBA1C 5.8 (H) 07/04/2017   HGBA1C 5.6 02/06/2017   HGBA1C 5.6 09/28/2016   Lab Results  Component Value Date   INSULIN 17.8 05/03/2018   INSULIN 15.3 01/09/2018   INSULIN 10.4 07/04/2017   INSULIN 28.1 (H) 02/06/2017   INSULIN 14.7 09/28/2016   CBC    Component Value Date/Time   WBC 7.6 01/09/2018 1338   WBC 6.6 08/16/2016 1011   RBC 4.69 01/09/2018 1338   RBC 4.68 08/16/2016 1011   HGB 13.1 01/09/2018 1338   HCT 39.5 01/09/2018 1338   PLT 235.0 08/16/2016 1011   MCV 84 01/09/2018 1338   MCH 27.9 01/09/2018 1338   MCH 27.0 04/19/2015 0650   MCHC 33.2 01/09/2018 1338   MCHC 33.4 08/16/2016 1011   RDW 14.7 01/09/2018 1338   LYMPHSABS 2.4 01/09/2018 1338   MONOABS 0.6 08/16/2016 1011   EOSABS 0.2 01/09/2018 1338   BASOSABS 0.1 01/09/2018 1338   Iron/TIBC/Ferritin/ %Sat No results found for: IRON, TIBC, FERRITIN, IRONPCTSAT Lipid Panel     Component Value Date/Time   CHOL 157 01/09/2018 1338   TRIG 84 01/09/2018 1338   HDL 58 01/09/2018 1338   CHOLHDL 3 08/16/2016 1011   VLDL 12.2 08/16/2016 1011   LDLCALC 82 01/09/2018 1338   Hepatic Function Panel     Component Value Date/Time   PROT 6.6  05/03/2018 1348   ALBUMIN 4.2 05/03/2018 1348   AST 23 05/03/2018 1348   ALT 20 05/03/2018 1348   ALKPHOS 90 05/03/2018 1348   BILITOT <0.2 05/03/2018 1348   BILIDIR 0.1 06/12/2013 1632   IBILI 0.2 06/11/2009 2309      Component Value Date/Time   TSH 1.840 01/09/2018 1338   TSH 2.100 07/04/2017 1216   TSH 1.54 08/16/2016 1011      OBESITY BEHAVIORAL INTERVENTION VISIT  Today's visit was # 37   Starting weight: 241 lbs Starting date: 09/28/16 Today's weight : 222 lbs  Today's date: 10/29/2018 Total lbs lost to date: 32    ASK: We discussed the diagnosis of obesity with Karie Mainland today and [name] agreed to give [place] permission to discuss obesity behavioral modification therapy today.  ASSESS: Tallia has the diagnosis of obesity and her BMI today is 33.76 Shaye is in the action stage of change  ADVISE: Timmia was educated on the multiple health risks of obesity as well as the benefit of weight loss to improve her health. She was advised of the need for long term treatment and the importance of lifestyle modifications to improve her current health and to decrease her risk of future health problems.  AGREE: Multiple dietary modification options and treatment options were discussed and  Yeily agreed to follow the recommendations documented in the above note.  ARRANGE: Jasie was educated on the importance of frequent visits to treat obesity as outlined per CMS and USPSTF guidelines and agreed to schedule her next follow up appointment today.  I, Trixie Dredge, am acting as transcriptionist for Dennard Nip, MD  I have reviewed the above documentation for accuracy and completeness, and I agree with the above. -Dennard Nip, MD

## 2018-10-30 LAB — COMPREHENSIVE METABOLIC PANEL
ALT: 16 IU/L (ref 0–32)
AST: 13 IU/L (ref 0–40)
Albumin/Globulin Ratio: 1.9 (ref 1.2–2.2)
Albumin: 4.4 g/dL (ref 3.8–4.8)
Alkaline Phosphatase: 92 IU/L (ref 39–117)
BUN/Creatinine Ratio: 33 — ABNORMAL HIGH (ref 9–23)
BUN: 22 mg/dL (ref 6–24)
Bilirubin Total: 0.3 mg/dL (ref 0.0–1.2)
CO2: 21 mmol/L (ref 20–29)
Calcium: 9.4 mg/dL (ref 8.7–10.2)
Chloride: 103 mmol/L (ref 96–106)
Creatinine, Ser: 0.67 mg/dL (ref 0.57–1.00)
GFR calc Af Amer: 120 mL/min/{1.73_m2} (ref >59)
GFR calc non Af Amer: 104 mL/min/{1.73_m2} (ref >59)
Globulin, Total: 2.3 g/dL (ref 1.5–4.5)
Glucose: 78 mg/dL (ref 65–99)
Potassium: 4.5 mmol/L (ref 3.5–5.2)
Sodium: 140 mmol/L (ref 134–144)
Total Protein: 6.7 g/dL (ref 6.0–8.5)

## 2018-10-30 LAB — LIPID PANEL WITH LDL/HDL RATIO
Cholesterol, Total: 162 mg/dL (ref 100–199)
HDL: 62 mg/dL (ref >39)
LDL Calculated: 85 mg/dL (ref 0–99)
LDl/HDL Ratio: 1.4 ratio (ref 0.0–3.2)
Triglycerides: 76 mg/dL (ref 0–149)
VLDL Cholesterol Cal: 15 mg/dL (ref 5–40)

## 2018-10-30 LAB — VITAMIN D 25 HYDROXY (VIT D DEFICIENCY, FRACTURES): Vit D, 25-Hydroxy: 50.5 ng/mL (ref 30.0–100.0)

## 2018-10-30 LAB — INSULIN, RANDOM: INSULIN: 14.1 u[IU]/mL (ref 2.6–24.9)

## 2018-10-30 LAB — HEMOGLOBIN A1C
Est. average glucose Bld gHb Est-mCnc: 103 mg/dL
Hgb A1c MFr Bld: 5.2 % (ref 4.8–5.6)

## 2018-11-13 ENCOUNTER — Other Ambulatory Visit: Payer: Self-pay | Admitting: Internal Medicine

## 2018-11-13 DIAGNOSIS — R3915 Urgency of urination: Secondary | ICD-10-CM | POA: Diagnosis not present

## 2018-11-13 DIAGNOSIS — N3000 Acute cystitis without hematuria: Secondary | ICD-10-CM | POA: Diagnosis not present

## 2018-11-13 DIAGNOSIS — N200 Calculus of kidney: Secondary | ICD-10-CM | POA: Diagnosis not present

## 2018-11-17 ENCOUNTER — Other Ambulatory Visit: Payer: Self-pay | Admitting: Internal Medicine

## 2018-11-19 ENCOUNTER — Encounter (INDEPENDENT_AMBULATORY_CARE_PROVIDER_SITE_OTHER): Payer: Self-pay | Admitting: Family Medicine

## 2018-11-19 ENCOUNTER — Other Ambulatory Visit: Payer: Self-pay

## 2018-11-19 ENCOUNTER — Ambulatory Visit (INDEPENDENT_AMBULATORY_CARE_PROVIDER_SITE_OTHER): Payer: PPO | Admitting: Family Medicine

## 2018-11-19 VITALS — BP 131/80 | HR 89 | Temp 98.7°F | Ht 68.0 in | Wt 228.0 lb

## 2018-11-19 DIAGNOSIS — Z6834 Body mass index (BMI) 34.0-34.9, adult: Secondary | ICD-10-CM | POA: Diagnosis not present

## 2018-11-19 DIAGNOSIS — F3289 Other specified depressive episodes: Secondary | ICD-10-CM | POA: Diagnosis not present

## 2018-11-19 DIAGNOSIS — R7303 Prediabetes: Secondary | ICD-10-CM

## 2018-11-19 DIAGNOSIS — Z9189 Other specified personal risk factors, not elsewhere classified: Secondary | ICD-10-CM | POA: Diagnosis not present

## 2018-11-19 DIAGNOSIS — K5909 Other constipation: Secondary | ICD-10-CM

## 2018-11-19 DIAGNOSIS — E669 Obesity, unspecified: Secondary | ICD-10-CM | POA: Diagnosis not present

## 2018-11-19 MED ORDER — BUPROPION HCL ER (SR) 200 MG PO TB12: 200.0000 mg | ORAL_TABLET | Freq: Every day | ORAL | 0 refills | Status: DC

## 2018-11-19 MED ORDER — METFORMIN HCL 500 MG PO TABS: 500.0000 mg | ORAL_TABLET | Freq: Two times a day (BID) | ORAL | 0 refills | Status: DC

## 2018-11-20 ENCOUNTER — Other Ambulatory Visit (INDEPENDENT_AMBULATORY_CARE_PROVIDER_SITE_OTHER): Payer: Self-pay | Admitting: Family Medicine

## 2018-11-20 DIAGNOSIS — E8881 Metabolic syndrome: Secondary | ICD-10-CM

## 2018-11-20 NOTE — Progress Notes (Signed)
Office: [phone]  /  Fax: [phone]   HPI:   Chief Complaint: OBESITY [name] is here to discuss her progress with her obesity treatment plan. She is keeping a food journal with 1200-1400 calories and 75+ grams of protein and is following her eating plan approximately 60% of the time. She states she is active with yard work.  [name] had increased celebration eating and has gained weight. She is also constipated. She also has been eating out more and states she will get back on track after her birthday tomorrow. Her weight is 228 lb (103.4 kg) today and has had a weight gain of 6 lbs since her last visit. She has lost 13 lbs since starting treatment with [place].  Constipation, chronic [name] notes decreased BM frequency. She had been on Linzess, but it caused diarrhea.  Pre-Diabetes [name] has a diagnosis of prediabetes based on her elevated Hgb A1c and was informed this puts her at greater risk of developing diabetes. She is taking metformin and her A1c has improved. She continues to work on diet and exercise to decrease risk of diabetes. She denies nausea, vomiting, or hypoglycemia.  At risk for diabetes [name] is at higher than average risk for developing diabetes due to her obesity. She currently denies polyuria or polydipsia.  Depression with emotional eating behaviors [name] is struggling with emotional eating and using food for comfort to the extent that it is negatively impacting her health. She often snacks when she is not hungry. [name] sometimes feels she is out of control and then feels guilty that she made poor food choices. She has been working on behavior modification techniques to help reduce her emotional eating and has been somewhat successful. [name] is still struggling with stress/emotional eating. She feels the Wellbutrin is helping but not enough. She shows no sign of suicidal or homicidal ideations.  Depression screen [facility] 2/9 09/28/2016 07/29/2015  09/22/2014  Decreased Interest 2 0 0  Down, Depressed, Hopeless 1 0 0  PHQ - 2 Score 3 0 0  Altered sleeping 1 - -  Tired, decreased energy 2 - -  Change in appetite 1 - -  Feeling bad or failure about yourself  1 - -  Trouble concentrating 2 - -  Moving slowly or fidgety/restless 1 - -  Suicidal thoughts 0 - -  PHQ-9 Score 11 - -  Some recent data might be hidden   ASSESSMENT AND PLAN:  Prediabetes - Plan: metFORMIN (GLUCOPHAGE) 500 MG tablet  Chronic constipation  Other depression - Plan: buPROPion (WELLBUTRIN SR) 200 MG 12 hr tablet  At risk for diabetes mellitus  Class 1 obesity with serious comorbidity and body mass index (BMI) of 34.0 to 34.9 in adult, unspecified obesity type  PLAN:  Constipation, chronic [name] was informed decrease bowel movement frequency is normal while losing weight, but stools should not be hard or painful. She was advised to take MiraLax 17 grams a day and take Linzess QOD PRN. She will follow-up with our clinic as directed in 3 weeks.  Pre-Diabetes [name] will continue to work on weight loss, exercise, and decreasing simple carbohydrates in her diet to help decrease the risk of diabetes. We dicussed metformin including benefits and risks. She was informed that eating too many simple carbohydrates or too many calories at one sitting increases the likelihood of GI side effects. [name] was given a refill on her metformin 500 mg #180 with 0 refills and agrees to follow-up with our clinic in 3 weeks.  Diabetes risk counseling [name] was given extended (15 minutes) diabetes prevention counseling today. She is 48 y.o. female and has risk factors for diabetes including obesity. We discussed intensive lifestyle modifications today with an emphasis on weight loss as well as increasing exercise and decreasing simple carbohydrates in her diet.  Depression with Emotional Eating Behaviors We discussed behavior modification techniques today to help  [name] deal with her emotional eating and depression. [name] will increase Wellbutrin to 200 mg QAM and decrease Celexa to QOD. Goal is to discontinue Celexa and increase Wellbutrin.  Obesity [name] is currently in the action stage of change. As such, her goal is to continue with weight loss efforts. She has agreed to keep a food journal with 1200-1400 calories and 75+ grams of protein.  [name] has been instructed to work up to a goal of 150 minutes of combined cardio and strengthening exercise per week for weight loss and overall health benefits. We discussed the following Behavioral Modification Strategies today: increasing lean protein intake and decreasing simple carbohydrates.  [name] has agreed to follow-up with our clinic in 3 weeks. She was informed of the importance of frequent follow-up visits to maximize her success with intensive lifestyle modifications for her multiple health conditions.  ALLERGIES: Allergies  Allergen Reactions  . Chloraprep One Step [Chlorhexidine Gluconate] Rash    Developed severe rash where chloraprep was used on chest area  . Ivp Dye [Iodinated Diagnostic Agents] Rash and Other (See Comments)    Flushing, minor facial rash & dyspnea  . Nalbuphine Shortness Of Breath and Rash    Nubain caused respiratory distress & rash  . Septra [Sulfamethoxazole-Trimethoprim] Shortness Of Breath and Rash  . Sulfamethoxazole-Trimethoprim Rash    rash  . Tylox [Oxycodone-Acetaminophen] Rash  . Augmentin [Amoxicillin-Pot Clavulanate] Nausea And Vomiting    MEDICATIONS: Current Outpatient Medications on File Prior to Visit  Medication Sig Dispense Refill  . aspirin 81 MG tablet Take 81 mg by mouth daily.     . BD PEN NEEDLE NANO U/F 32G X 4 MM MISC USE 2 TIMES DAILY 100 each 0  . citalopram (CELEXA) 20 MG tablet TAKE 1 TABLET BY MOUTH EVERY DAY 90 tablet 0  . clobetasol cream (TEMOVATE) 7.61 % Apply 1 application topically 2 (two) times daily. 30 g 0  .  famotidine (PEPCID) 40 MG tablet TAKE 1 TABLET BY MOUTH EVERY DAY 90 tablet 0  . Fluticasone-Salmeterol (ADVAIR DISKUS) 100-50 MCG/DOSE AEPB Inhale 1 puff into the lungs 2 (two) times daily. 60 each 5  . Fluticasone-Salmeterol (ADVAIR DISKUS) 250-50 MCG/DOSE AEPB Inhale 1 puff into the lungs 2 (two) times daily. 1 each 3  . hydrochlorothiazide (MICROZIDE) 12.5 MG capsule Take 1 capsule by mouth daily. 90 capsule 1  . linaclotide (LINZESS) 72 MCG capsule Take 1 capsule (72 mcg total) by mouth daily before breakfast. 30 capsule 5  . liraglutide (VICTOZA) 18 MG/3ML SOPN Inject 0.2 mLs (1.2 mg total) into the skin every morning. 6 pen 0  . lisinopril (ZESTRIL) 40 MG tablet TAKE 1 TABLET BY MOUTH EVERY DAY 90 tablet 1  . montelukast (SINGULAIR) 10 MG tablet Take 1 tablet (10 mg total) by mouth at bedtime. 90 tablet 1  . nitrofurantoin, macrocrystal-monohydrate, (MACROBID) 100 MG capsule Take 1 capsule (100 mg total) by mouth 2 (two) times daily. 14 capsule 0  . polyethylene glycol powder (GLYCOLAX/MIRALAX) powder Take 17 g by mouth daily. 3350 g 0  . PROAIR HFA 108 (90 Base) MCG/ACT inhaler TAKE 2 PUFFS BY  MOUTH EVERY 6 HOURS AS NEEDED FOR WHEEZE OR SHORTNESS OF BREATH 8.5 Inhaler 5  . Vitamin D, Ergocalciferol, (DRISDOL) 1.25 MG (50000 UT) CAPS capsule Take 1 capsule (50,000 Units total) by mouth every 7 (seven) days. 4 capsule 0   No current facility-administered medications on file prior to visit.     PAST MEDICAL HISTORY: Past Medical History:  Diagnosis Date  . Anemia   . Anxiety   . Arthritis    inflammitory  . Asthma   . B12 deficiency   . Back pain   . Chronic joint pain   . Constipation   . DDD (degenerative disc disease), lumbar    low back and neck and shoulders  . Depression    during divorce & legal matters  . Fatty liver   . Fibromyalgia   . Gallbladder problem   . GERD (gastroesophageal reflux disease)   . History of kidney stones    Dr Phebe Colla  . History of  polycystic ovarian disease S/P BSO  . Hx of anxiety disorder   . Hypertension    a  . Hypothyroidism    no meds  . IBS (irritable bowel syndrome)   . IBS (irritable bowel syndrome)   . Infertility, female   . Joint pain   . Kidney problem   . Lactose intolerance   . Lupus (HCC) hx positive ANA   followed by Dr Gavin Pound; possible lupus  . Multiple food allergies   . Myalgia   . Osteoarthritis   . 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 POLYCYSTIC OVARIAN DISEASE 03/02/2007   Qualifier: Diagnosis of  By: Linna Darner MD, Rae Mar BSO for cysts & endometriosis; Dr Reino Kent, Gyn Seeing Dr Paula Compton    . PONV (postoperative nausea and vomiting)   . Prediabetes   . Sweating profusely   . Symptomatic mammary hypertrophy   . URI (upper respiratory infection)    currently on cefdinir    PAST SURGICAL HISTORY: Past Surgical History:  Procedure Laterality Date  . [place]   exploratory lap  . BREAST REDUCTION SURGERY Bilateral 03/26/2015   Procedure: BILATERAL BREAST REDUCTION ;  Surgeon: Wallace Going, DO;  Location: [place];  Service: Plastics;  Laterality: Bilateral;  . CARPAL TUNNEL RELEASE     bilateral; Dr Sherwood Gambler  . COLONOSCOPY     in 1990s  . CYSTO/ RIGHT RETROGRADE URETERAL PYELOGRAM  07-09-2004   HX BILATERAL RENAL STONES/ RIGHT FLANK PAIN  . CYSTOSCOPY W/ URETERAL STENT PLACEMENT  11/12/2011   Procedure: CYSTOSCOPY WITH RETROGRADE PYELOGRAM/URETERAL STENT PLACEMENT;  Surgeon: Alexis Frock, MD;  Location: [facility];  Service: Urology;  Laterality: Right;  . CYSTOSCOPY W/ URETERAL STENT REMOVAL  12/07/2011   Procedure: CYSTOSCOPY WITH STENT REMOVAL;  Surgeon: Alexis Frock, MD;  Location[facility];  Service: Urology;  Laterality: Right;  . CYSTOSCOPY/RETROGRADE/URETEROSCOPY/STONE EXTRACTION WITH BASKET  12/07/2011   Procedure: CYSTOSCOPY/RETROGRADE/URETEROSCOPY/STONE EXTRACTION WITH BASKET;   Surgeon: Alexis Frock, MD;  Location: [facility];  Service: Urology;  Laterality: Right;  . DILATION AND CURETTAGE OF UTERUS     X5  . EXCISION LEFT BARTHOLIN GLAND  02-05-2002  . EYE SURGERY     Retinal tears surgery bilateral  . LAPAROSCOPIC ASSISTED VAGINAL HYSTERECTOMY  2000  . LAPAROSCOPIC CHOLECYSTECTOMY  05-31-2007  . LAPAROSCOPIC LASER ABLATION ENDOMETRIOSIS AND LEFT SALPINGO-OOPHORECTOMY  11-03-1999  . LAPAROSCOPY WITH RIGHT SALPINGO-OOPHECTOMY/ LYSIS  ADHESIONS AND ABLATION ENDOMETRIOSIS  05-17-2001  . LIPOSUCTION Bilateral 03/26/2015   Procedure: WITH LIPOSUCTION;  Surgeon: Wallace Going, DO;  Location: [place];  Service: Plastics;  Laterality: Bilateral;  . LUMBAR LAMINECTOMY  2003   L4 - L5  . PLANTAR FASCIA SURGERY     right  . PLANTAR FASCIA SURGERY  02/2011   left  . RIGHT URETEROSCOPIC STONE EXTRACTION  08-04-2000  . TONSILLECTOMY    . URETERAL STENT PLACEMENT  11/12/11   Dr Tresa Moore    SOCIAL HISTORY: Social History   Tobacco Use  . Smoking status: Never Smoker  . Smokeless tobacco: Never Used  Substance Use Topics  . Alcohol use: Yes    Alcohol/week: 1.0 standard drinks    Types: 1 Shots of liquor per week    Comment:  very rarely  . Drug use: No    FAMILY HISTORY: Family History  Problem Relation Age of Onset  . Hypertension Mother   . Colon polyps Mother   . Atrial fibrillation Mother        Dr Angelena Form  . Hyperlipidemia Mother   . Heart disease Mother   . Anxiety disorder Mother   . Sleep apnea Mother   . Obesity Mother   . Heart attack Father 63  . Diabetes Father   . Hypertension Father   . Hyperlipidemia Father   . Heart disease Father   . Stroke Father   . Obesity Father   . Hypertension Sister   . Heart attack Paternal Grandmother 49  . Breast cancer Paternal Aunt   . Colon cancer Maternal Uncle   . Colon cancer Paternal Uncle   . Colon polyps Maternal Grandmother   . Stroke Maternal  Grandmother 76  . Diabetes Paternal Grandfather   . Stroke Maternal Grandfather 37   ROS: Review of Systems  Gastrointestinal: Positive for constipation (chronic). Negative for nausea and vomiting.  Endo/Heme/Allergies:       Negative for hypoglycemia.  Psychiatric/Behavioral: Positive for depression (stress/emotional eating). Negative for suicidal ideas.       Negative for homicidal ideas.   PHYSICAL EXAM: Blood pressure 131/80, pulse 89, temperature 98.7 F (37.1 C), temperature source Oral, height 5\' 8"  (1.727 m), weight 228 lb (103.4 kg), SpO2 98 %. Body mass index is 34.67 kg/m. Physical Exam Vitals signs reviewed.  Constitutional:      Appearance: Normal appearance. She is obese.  Cardiovascular:     Rate and Rhythm: Normal rate.     Pulses: Normal pulses.  Pulmonary:     Effort: Pulmonary effort is normal.     Breath sounds: Normal breath sounds.  Musculoskeletal: Normal range of motion.  Skin:    General: Skin is warm and dry.  Neurological:     Mental Status: She is alert and oriented to person, place, and time.  Psychiatric:        Behavior: Behavior normal.   RECENT LABS AND TESTS: BMET    Component Value Date/Time   NA 140 10/29/2018 1228   K 4.5 10/29/2018 1228   CL 103 10/29/2018 1228   CO2 21 10/29/2018 1228   GLUCOSE 78 10/29/2018 1228   GLUCOSE 92 08/16/2016 1011   BUN 22 10/29/2018 1228   CREATININE 0.67 10/29/2018 1228   CALCIUM 9.4 10/29/2018 1228   GFRNONAA 104 10/29/2018 1228   GFRAA 120 10/29/2018 1228   Lab Results  Component Value Date   HGBA1C 5.2 10/29/2018   HGBA1C 5.4 05/03/2018   HGBA1C  5.4 01/09/2018   HGBA1C 5.8 (H) 07/04/2017   HGBA1C 5.6 02/06/2017   Lab Results  Component Value Date   INSULIN 14.1 10/29/2018   INSULIN 17.8 05/03/2018   INSULIN 15.3 01/09/2018   INSULIN 10.4 07/04/2017   INSULIN 28.1 (H) 02/06/2017   CBC    Component Value Date/Time   WBC 7.6 01/09/2018 1338   WBC 6.6 08/16/2016 1011   RBC 4.69  01/09/2018 1338   RBC 4.68 08/16/2016 1011   HGB 13.1 01/09/2018 1338   HCT 39.5 01/09/2018 1338   PLT 235.0 08/16/2016 1011   MCV 84 01/09/2018 1338   MCH 27.9 01/09/2018 1338   MCH 27.0 04/19/2015 0650   MCHC 33.2 01/09/2018 1338   MCHC 33.4 08/16/2016 1011   RDW 14.7 01/09/2018 1338   LYMPHSABS 2.4 01/09/2018 1338   MONOABS 0.6 08/16/2016 1011   EOSABS 0.2 01/09/2018 1338   BASOSABS 0.1 01/09/2018 1338   Iron/TIBC/Ferritin/ %Sat No results found for: IRON, TIBC, FERRITIN, IRONPCTSAT Lipid Panel     Component Value Date/Time   CHOL 162 10/29/2018 1228   TRIG 76 10/29/2018 1228   HDL 62 10/29/2018 1228   CHOLHDL 3 08/16/2016 1011   VLDL 12.2 08/16/2016 1011   LDLCALC 85 10/29/2018 1228   Hepatic Function Panel     Component Value Date/Time   PROT 6.7 10/29/2018 1228   ALBUMIN 4.4 10/29/2018 1228   AST 13 10/29/2018 1228   ALT 16 10/29/2018 1228   ALKPHOS 92 10/29/2018 1228   BILITOT 0.3 10/29/2018 1228   BILIDIR 0.1 06/12/2013 1632   IBILI 0.2 06/11/2009 2309      Component Value Date/Time   TSH 1.840 01/09/2018 1338   TSH 2.100 07/04/2017 1216   TSH 1.54 08/16/2016 1011   Results for Oesterling, Airel E "KIM" (MRN [id number]) as of 11/20/2018 10:30  Ref. Range 10/29/2018 12:28  Vitamin D, 25-Hydroxy Latest Ref Range: 30.0 - 100.0 ng/mL 50.5   OBESITY BEHAVIORAL INTERVENTION VISIT  Today's visit was #38  Starting weight: 241 lbs Starting date: 09/28/2016 Today's weight: 228 lbs  Today's date: 11/19/2018 Total lbs lost to date: 13    11/19/2018  Height 5\' 8"  (1.727 m)  Weight 228 lb (103.4 kg)  BMI (Calculated) 34.68  BLOOD PRESSURE - SYSTOLIC 376  BLOOD PRESSURE - DIASTOLIC 80   Body Fat % 28.3 %  Total Body Water (lbs) 86 lbs   ASK: We discussed the diagnosis of obesity with Karie Mainland today and Perrin agreed to give [place] permission to discuss obesity behavioral modification therapy today.  ASSESS: Tucker has the diagnosis of obesity and her  BMI today is 34.7. Renlee is in the action stage of change.   ADVISE: Anya was educated on the multiple health risks of obesity as well as the benefit of weight loss to improve her health. She was advised of the need for long term treatment and the importance of lifestyle modifications to improve her current health and to decrease her risk of future health problems.  AGREE: Multiple dietary modification options and treatment options were discussed and  Lacara agreed to follow the recommendations documented in the above note.  ARRANGE: Tykira was educated on the importance of frequent visits to treat obesity as outlined per CMS and USPSTF guidelines and agreed to schedule her next follow up appointment today.  I, Michaelene Song, am acting as Location manager for Dennard Nip, MD  I have reviewed the above documentation for accuracy and completeness, and I agree with  the above. -Dennard Nip, MD

## 2018-11-28 DIAGNOSIS — M25512 Pain in left shoulder: Secondary | ICD-10-CM | POA: Diagnosis not present

## 2018-11-28 DIAGNOSIS — M542 Cervicalgia: Secondary | ICD-10-CM | POA: Diagnosis not present

## 2018-11-28 DIAGNOSIS — M25511 Pain in right shoulder: Secondary | ICD-10-CM | POA: Diagnosis not present

## 2018-12-03 ENCOUNTER — Other Ambulatory Visit: Payer: Self-pay | Admitting: Internal Medicine

## 2018-12-04 ENCOUNTER — Other Ambulatory Visit (INDEPENDENT_AMBULATORY_CARE_PROVIDER_SITE_OTHER): Payer: Self-pay

## 2018-12-04 ENCOUNTER — Encounter (INDEPENDENT_AMBULATORY_CARE_PROVIDER_SITE_OTHER): Payer: Self-pay | Admitting: Family Medicine

## 2018-12-04 ENCOUNTER — Other Ambulatory Visit: Payer: Self-pay

## 2018-12-04 DIAGNOSIS — R202 Paresthesia of skin: Secondary | ICD-10-CM

## 2018-12-04 DIAGNOSIS — F3289 Other specified depressive episodes: Secondary | ICD-10-CM

## 2018-12-04 MED ORDER — BUPROPION HCL ER (SR) 200 MG PO TB12: 200.0000 mg | ORAL_TABLET | Freq: Every day | ORAL | 0 refills | Status: DC

## 2018-12-06 ENCOUNTER — Ambulatory Visit (INDEPENDENT_AMBULATORY_CARE_PROVIDER_SITE_OTHER): Payer: PPO | Admitting: Family Medicine

## 2018-12-07 ENCOUNTER — Other Ambulatory Visit: Payer: Self-pay | Admitting: Internal Medicine

## 2018-12-17 ENCOUNTER — Encounter (INDEPENDENT_AMBULATORY_CARE_PROVIDER_SITE_OTHER): Payer: Self-pay | Admitting: Family Medicine

## 2018-12-17 NOTE — Telephone Encounter (Signed)
Please advise 

## 2018-12-18 ENCOUNTER — Other Ambulatory Visit (INDEPENDENT_AMBULATORY_CARE_PROVIDER_SITE_OTHER): Payer: Self-pay

## 2018-12-18 DIAGNOSIS — F3289 Other specified depressive episodes: Secondary | ICD-10-CM

## 2018-12-18 DIAGNOSIS — R7303 Prediabetes: Secondary | ICD-10-CM

## 2018-12-18 MED ORDER — GLUCOSE BLOOD VI STRP: ORAL_STRIP | 0 refills | Status: DC

## 2018-12-18 MED ORDER — BLOOD GLUCOSE METER KIT: PACK | 0 refills | Status: DC

## 2018-12-18 MED ORDER — ONETOUCH DELICA LANCETS 30G MISC: 1.0000 | Freq: Two times a day (BID) | 0 refills | Status: DC

## 2018-12-18 MED ORDER — BUPROPION HCL ER (SR) 150 MG PO TB12: 150.0000 mg | ORAL_TABLET | Freq: Every day | ORAL | 0 refills | Status: DC

## 2018-12-18 NOTE — Telephone Encounter (Signed)
Please advise 

## 2018-12-21 DIAGNOSIS — H1045 Other chronic allergic conjunctivitis: Secondary | ICD-10-CM | POA: Diagnosis not present

## 2018-12-31 ENCOUNTER — Ambulatory Visit (INDEPENDENT_AMBULATORY_CARE_PROVIDER_SITE_OTHER): Payer: PPO | Admitting: Family Medicine

## 2019-01-07 ENCOUNTER — Ambulatory Visit (INDEPENDENT_AMBULATORY_CARE_PROVIDER_SITE_OTHER): Payer: PPO | Admitting: Family Medicine

## 2019-01-10 ENCOUNTER — Other Ambulatory Visit: Payer: Self-pay

## 2019-01-10 ENCOUNTER — Ambulatory Visit (INDEPENDENT_AMBULATORY_CARE_PROVIDER_SITE_OTHER): Payer: PPO | Admitting: Neurology

## 2019-01-10 DIAGNOSIS — M5412 Radiculopathy, cervical region: Secondary | ICD-10-CM

## 2019-01-10 DIAGNOSIS — R202 Paresthesia of skin: Secondary | ICD-10-CM | POA: Diagnosis not present

## 2019-01-10 NOTE — Procedures (Signed)
[facility]  [place], [address]  [place], [place] [address] Tel: [phone] Fax:  [phone] Test Date:  01/10/2019  Patient: [name] DOB: 1970-12-25 Physician: Narda Amber, DO  Sex: Female Height: 5\' 8"  Ref Phys: Edmonia Lynch, MD  ID#: [id number] Temp: 33.0C Technician:    Patient Complaints: This is a 48 year old female referred for evaluation of bilateral arm pain.  NCV & EMG Findings: Extensive electrodiagnostic testing of the left upper extremity and additional studies of the right shows:  1. Bilateral median, ulnar, and mixed palmar sensory responses are within normal limits. 2. Bilateral median and ulnar motor responses are within normal limits. 3. There is no evidence of active or chronic motor axonal loss changes affecting any of the tested muscles.  Motor unit configuration and recruitment pattern is within normal limits.   Impression: 1. Chronic C6 radiculopathy affecting the right upper extremity, mild. 2. There is no evidence of cervical radiculopathy affecting the left upper extremity.   ___________________________ Narda Amber, DO    Nerve Conduction Studies Anti Sensory Summary Table   Site NR Peak (ms) Norm Peak (ms) P-T Amp (V) Norm P-T Amp  Left Median Anti Sensory (2nd Digit)  33C  Wrist    3.1 <3.4 27.2 >20  Right Median Anti Sensory (2nd Digit)  33C  Wrist    3.3 <3.4 26.6 >20  Left Ulnar Anti Sensory (5th Digit)  33C  Wrist    2.5 <3.1 28.9 >12  Right Ulnar Anti Sensory (5th Digit)  33C  Wrist    2.4 <3.1 28.5 >12   Motor Summary Table   Site NR Onset (ms) Norm Onset (ms) O-P Amp (mV) Norm O-P Amp Site1 Site2 Delta-0 (ms) Dist (cm) Vel (m/s) Norm Vel (m/s)  Left Median Motor (Abd Poll Brev)  33C  Wrist    2.8 <3.9 12.1 >6 Elbow Wrist 4.8 27.0 56 >50  Elbow    7.6  11.5         Right Median Motor (Abd Poll Brev)  33C  Wrist    2.9 <3.9 12.3 >6 Elbow Wrist 5.1 29.0 57 >50  Elbow    8.0  12.2         Left  Ulnar Motor (Abd Dig Minimi)  33C  Wrist    2.1 <3.1 10.1 >7 B Elbow Wrist 3.6 22.0 61 >50  B Elbow    5.7  9.5  A Elbow B Elbow 1.8 10.0 56 >50  A Elbow    7.5  9.5         Right Ulnar Motor (Abd Dig Minimi)  33C  Wrist    2.2 <3.1 11.1 >7 B Elbow Wrist 3.3 22.0 67 >50  B Elbow    5.5  11.0  A Elbow B Elbow 1.7 10.0 59 >50  A Elbow    7.2  10.5          Comparison Summary Table   Site NR Peak (ms) Norm Peak (ms) P-T Amp (V) Site1 Site2 Delta-P (ms) Norm Delta (ms)  Left Median/Ulnar Palm Comparison (Wrist - 8cm)  33C  Median Palm    1.8 <2.2 51.4 Median Palm Ulnar Palm 0.2   Ulnar Palm    1.6 <2.2 24.1      Right Median/Ulnar Palm Comparison (Wrist - 8cm)  33C  Median Palm    1.8 <2.2 25.0 Median Palm Ulnar Palm 0.3   Ulnar Palm    1.5 <2.2 10.5  EMG   Side Muscle Ins Act Fibs Psw Fasc Number Recrt Dur Dur. Amp Amp. Poly Poly. Comment  Right 1stDorInt Nml Nml Nml Nml Nml Nml Nml Nml Nml Nml Nml Nml N/A  Right Biceps Nml Nml Nml Nml 1- Few 1+ Few 1+ Nml Nml Nml N/A  Right PronatorTeres Nml Nml Nml Nml 1- Few 1+ Few 1+ Nml Nml Nml N/A  Right Deltoid Nml Nml Nml Nml Nml Nml Nml Nml Nml Nml Nml Nml N/A  Right Triceps Nml Nml Nml Nml Nml Nml Nml Nml Nml Nml Nml Nml N/A  Left 1stDorInt Nml Nml Nml Nml Nml Nml Nml Nml Nml Nml Nml Nml N/A  Left PronatorTeres Nml Nml Nml Nml Nml Nml Nml Nml Nml Nml Nml Nml N/A  Left Biceps Nml Nml Nml Nml Nml Nml Nml Nml Nml Nml Nml Nml N/A  Left Triceps Nml Nml Nml Nml Nml Nml Nml Nml Nml Nml Nml Nml N/A  Left Deltoid Nml Nml Nml Nml Nml Nml Nml Nml Nml Nml Nml Nml N/A      Waveforms:

## 2019-01-14 DIAGNOSIS — M25511 Pain in right shoulder: Secondary | ICD-10-CM | POA: Diagnosis not present

## 2019-01-16 DIAGNOSIS — M7522 Bicipital tendinitis, left shoulder: Secondary | ICD-10-CM | POA: Diagnosis not present

## 2019-01-16 DIAGNOSIS — M1990 Unspecified osteoarthritis, unspecified site: Secondary | ICD-10-CM | POA: Diagnosis not present

## 2019-01-24 ENCOUNTER — Other Ambulatory Visit: Payer: Self-pay

## 2019-01-24 ENCOUNTER — Encounter (INDEPENDENT_AMBULATORY_CARE_PROVIDER_SITE_OTHER): Payer: Self-pay | Admitting: Family Medicine

## 2019-01-24 ENCOUNTER — Ambulatory Visit (INDEPENDENT_AMBULATORY_CARE_PROVIDER_SITE_OTHER): Payer: PPO | Admitting: Family Medicine

## 2019-01-24 VITALS — BP 102/68 | HR 82 | Temp 98.2°F | Ht 68.0 in | Wt 218.0 lb

## 2019-01-24 DIAGNOSIS — E559 Vitamin D deficiency, unspecified: Secondary | ICD-10-CM

## 2019-01-24 DIAGNOSIS — F3289 Other specified depressive episodes: Secondary | ICD-10-CM

## 2019-01-24 DIAGNOSIS — Z6833 Body mass index (BMI) 33.0-33.9, adult: Secondary | ICD-10-CM | POA: Diagnosis not present

## 2019-01-24 DIAGNOSIS — E8881 Metabolic syndrome: Secondary | ICD-10-CM | POA: Diagnosis not present

## 2019-01-24 DIAGNOSIS — E669 Obesity, unspecified: Secondary | ICD-10-CM | POA: Diagnosis not present

## 2019-01-24 DIAGNOSIS — R7303 Prediabetes: Secondary | ICD-10-CM | POA: Diagnosis not present

## 2019-01-24 MED ORDER — VITAMIN D (ERGOCALCIFEROL) 1.25 MG (50000 UNIT) PO CAPS: 50000.0000 [IU] | ORAL_CAPSULE | ORAL | 0 refills | Status: DC

## 2019-01-24 MED ORDER — LIRAGLUTIDE 18 MG/3ML ~~LOC~~ SOPN: 1.2000 mg | PEN_INJECTOR | Freq: Every morning | SUBCUTANEOUS | 0 refills | Status: DC

## 2019-01-24 MED ORDER — METFORMIN HCL 500 MG PO TABS: 500.0000 mg | ORAL_TABLET | Freq: Two times a day (BID) | ORAL | 0 refills | Status: DC

## 2019-01-24 MED ORDER — BUPROPION HCL ER (SR) 150 MG PO TB12: 150.0000 mg | ORAL_TABLET | Freq: Every day | ORAL | 0 refills | Status: DC

## 2019-01-27 NOTE — Progress Notes (Signed)
Office: [phone]  /  Fax: [phone]   HPI:   Chief Complaint: OBESITY [name] is here to discuss her progress with her obesity treatment plan. She is on the keep a food journal with 1200-1400 calories and 75+ grams of protein daily and is following her eating plan approximately 100 % of the time. She states she is walking for 30 minutes 3-4 times per week. [name] continues to do very well with weight loss since she started journaling strictly. Her hunger is better controlled.  Her weight is 218 lb (98.9 kg) today and has had a weight loss of 10 pounds over a period of 9 weeks since her last visit. She has lost 23 lbs since starting treatment with [place].  Insulin Resistance [name] has a diagnosis of insulin resistance based on her elevated fasting insulin level >5. Although [name]'s blood glucose readings are still under good control, insulin resistance puts her at greater risk of metabolic syndrome and diabetes. She is stable on her medications, and she is doing well on her diet. She denies nausea, vomiting, or hypoglycemia. She continues to work on exercise to decrease risk of diabetes.  Vitamin D Deficiency [name] has a diagnosis of vitamin D deficiency. She is stable on prescription Vit D, and last level was at goal. She denies nausea, vomiting or muscle weakness.  Depression with Emotional Eating Behaviors [name] mood is stable on Wellbutrin. She still does some emotional eating but she is doing better controlling this overall. [name] struggles with emotional eating and using food for comfort to the extent that it is negatively impacting her health. She often snacks when she is not hungry. [name] sometimes feels she is out of control and then feels guilty that she made poor food choices. She has been working on behavior modification techniques to help reduce her emotional eating and has been somewhat successful. She shows no sign of suicidal or homicidal ideations.   Depression screen [facility] 2/9 09/28/2016 07/29/2015 09/22/2014  Decreased Interest 2 0 0  Down, Depressed, Hopeless 1 0 0  PHQ - 2 Score 3 0 0  Altered sleeping 1 - -  Tired, decreased energy 2 - -  Change in appetite 1 - -  Feeling bad or failure about yourself  1 - -  Trouble concentrating 2 - -  Moving slowly or fidgety/restless 1 - -  Suicidal thoughts 0 - -  PHQ-9 Score 11 - -  Some recent data might be hidden    ASSESSMENT AND PLAN:  Insulin resistance - Plan: liraglutide (VICTOZA) 18 MG/3ML SOPN  Prediabetes - Plan: metFORMIN (GLUCOPHAGE) 500 MG tablet  Vitamin D deficiency - Plan: Vitamin D, Ergocalciferol, (DRISDOL) 1.25 MG (50000 UT) CAPS capsule  Other depression - with emotional eating - Plan: buPROPion (WELLBUTRIN SR) 150 MG 12 hr tablet  Class 1 obesity with serious comorbidity and body mass index (BMI) of 33.0 to 33.9 in adult, unspecified obesity type  PLAN:  Insulin Resistance [name] will continue to work on weight loss, exercise, and decreasing simple carbohydrates in her diet to help decrease the risk of diabetes. We dicussed metformin including benefits and risks. She was informed that eating too many simple carbohydrates or too many calories at one sitting increases the likelihood of GI side effects. [name] agrees to continue Victoza 1.2 mg SubQ daily #6 pens with no refills; and she agrees to continue taking metformin 500 mg PO BID #180, 90 day supply with no refills. [name] agrees to follow up with our clinic  in 4 weeks as directed to monitor her progress.  Vitamin D Deficiency [name] was informed that low vitamin D levels contributes to fatigue and are associated with obesity, breast, and colon cancer. [name] agrees to continue taking prescription Vit D 50,000 IU every week #12, 90 day supply with no refills. She will follow up for routine testing of vitamin D, at least 2-3 times per year. She was informed of the risk of over-replacement of vitamin D and  agrees to not increase her dose unless she discusses this with [place] first. [name] agrees to follow up with our clinic in 4 weeks.  Depression with Emotional Eating Behaviors We discussed behavior modification techniques today to help [name] deal with her emotional eating and depression. [name] agrees to continue taking Wellbutrin SR 150 mg PO q daily #90 day supply with no refills. [name] agrees to follow up with our clinic in 4 weeks.  Obesity [name] is currently in the action stage of change. As such, her goal is to continue with weight loss efforts She has agreed to keep a food journal with 1200-1600 calories and 90+ grams of protein daily, with less than 50 grams of sugar [name] has been instructed to work up to a goal of 150 minutes of combined cardio and strengthening exercise per week for weight loss and overall health benefits. We discussed the following Behavioral Modification Strategies today: increasing lean protein intake and decreasing simple carbohydrates    [name] has agreed to follow up with our clinic in 4 weeks. She was informed of the importance of frequent follow up visits to maximize her success with intensive lifestyle modifications for her multiple health conditions.  ALLERGIES: Allergies  Allergen Reactions  . Chloraprep One Step [Chlorhexidine Gluconate] Rash    Developed severe rash where chloraprep was used on chest area  . Ivp Dye [Iodinated Diagnostic Agents] Rash and Other (See Comments)    Flushing, minor facial rash & dyspnea  . Nalbuphine Shortness Of Breath and Rash    Nubain caused respiratory distress & rash  . Septra [Sulfamethoxazole-Trimethoprim] Shortness Of Breath and Rash  . Sulfamethoxazole-Trimethoprim Rash    rash  . Tylox [Oxycodone-Acetaminophen] Rash  . Diclofenac Other (See Comments)    GI Upset  . Augmentin [Amoxicillin-Pot Clavulanate] Nausea And Vomiting    MEDICATIONS: Current Outpatient Medications on File Prior to  Visit  Medication Sig Dispense Refill  . aspirin 81 MG tablet Take 81 mg by mouth daily.     . BD PEN NEEDLE NANO U/F 32G X 4 MM MISC USE 2 TIMES DAILY 100 each 0  . blood glucose meter kit and supplies Dispense based on patient and insurance preference. Use up to two times daily as directed. (FOR ICD-10 E10.9, E11.9). 1 each 0  . celecoxib (CELEBREX) 200 MG capsule Take 200 mg by mouth 2 (two) times daily.    . citalopram (CELEXA) 20 MG tablet TAKE 1 TABLET BY MOUTH EVERY DAY 90 tablet 0  . clobetasol cream (TEMOVATE) 3.88 % Apply 1 application topically 2 (two) times daily. 30 g 0  . famotidine (PEPCID) 20 MG tablet Take 2 tablets (40 mg total) by mouth daily. -- Office visit needed for further refills 180 tablet 0  . Fluticasone-Salmeterol (ADVAIR DISKUS) 100-50 MCG/DOSE AEPB Inhale 1 puff into the lungs 2 (two) times daily. 60 each 5  . Fluticasone-Salmeterol (ADVAIR DISKUS) 250-50 MCG/DOSE AEPB Inhale 1 puff into the lungs 2 (two) times daily. 1 each 3  . glucose blood test strip  Use as instructed 100 each 0  . hydrochlorothiazide (MICROZIDE) 12.5 MG capsule Take 1 capsule by mouth daily. 90 capsule 1  . linaclotide (LINZESS) 72 MCG capsule Take 1 capsule (72 mcg total) by mouth daily before breakfast. 30 capsule 5  . lisinopril (ZESTRIL) 40 MG tablet TAKE 1 TABLET BY MOUTH EVERY DAY 90 tablet 0  . montelukast (SINGULAIR) 10 MG tablet Take 1 tablet (10 mg total) by mouth at bedtime. 90 tablet 1  . nitrofurantoin, macrocrystal-monohydrate, (MACROBID) 100 MG capsule Take 1 capsule (100 mg total) by mouth 2 (two) times daily. 14 capsule 0  . OneTouch Delica Lancets 16W MISC 1 each by Does not apply route 2 (two) times daily. 100 each 0  . polyethylene glycol powder (GLYCOLAX/MIRALAX) powder Take 17 g by mouth daily. 3350 g 0  . PROAIR HFA 108 (90 Base) MCG/ACT inhaler TAKE 2 PUFFS BY MOUTH EVERY 6 HOURS AS NEEDED FOR WHEEZE OR SHORTNESS OF BREATH 8.5 Inhaler 5   No current  facility-administered medications on file prior to visit.     PAST MEDICAL HISTORY: Past Medical History:  Diagnosis Date  . Anemia   . Anxiety   . Arthritis    inflammitory  . Asthma   . B12 deficiency   . Back pain   . Chronic joint pain   . Constipation   . DDD (degenerative disc disease), lumbar    low back and neck and shoulders  . Depression    during divorce & legal matters  . Fatty liver   . Fibromyalgia   . Gallbladder problem   . GERD (gastroesophageal reflux disease)   . History of kidney stones    Dr Phebe Colla  . History of polycystic ovarian disease S/P BSO  . Hx of anxiety disorder   . Hypertension    a  . Hypothyroidism    no meds  . IBS (irritable bowel syndrome)   . IBS (irritable bowel syndrome)   . Infertility, female   . Joint pain   . Kidney problem   . Lactose intolerance   . Lupus (HCC) hx positive ANA   followed by Dr Gavin Pound; possible lupus  . Multiple food allergies   . Myalgia   . Osteoarthritis   . 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 POLYCYSTIC OVARIAN DISEASE 03/02/2007   Qualifier: Diagnosis of  By: Linna Darner MD, Rae Mar BSO for cysts & endometriosis; Dr Reino Kent, Gyn Seeing Dr Paula Compton    . PONV (postoperative nausea and vomiting)   . Prediabetes   . Sweating profusely   . Symptomatic mammary hypertrophy   . URI (upper respiratory infection)    currently on cefdinir    PAST SURGICAL HISTORY: Past Surgical History:  Procedure Laterality Date  . [place]   exploratory lap  . BREAST REDUCTION SURGERY Bilateral 03/26/2015   Procedure: BILATERAL BREAST REDUCTION ;  Surgeon: Wallace Going, DO;  Location: [facility];  Service: Plastics;  Laterality: Bilateral;  . CARPAL TUNNEL RELEASE     bilateral; Dr Sherwood Gambler  . COLONOSCOPY     in 1990s  . CYSTO/ RIGHT RETROGRADE URETERAL PYELOGRAM  07-09-2004   HX BILATERAL RENAL STONES/ RIGHT FLANK PAIN  . CYSTOSCOPY  W/ URETERAL STENT PLACEMENT  11/12/2011   Procedure: CYSTOSCOPY WITH RETROGRADE PYELOGRAM/URETERAL STENT PLACEMENT;  Surgeon: Alexis Frock, MD;  Location: [facility];  Service: Urology;  Laterality: Right;  . CYSTOSCOPY  W/ URETERAL STENT REMOVAL  12/07/2011   Procedure: CYSTOSCOPY WITH STENT REMOVAL;  Surgeon: Alexis Frock, MD;  Location[facility];  Service: Urology;  Laterality: Right;  . CYSTOSCOPY/RETROGRADE/URETEROSCOPY/STONE EXTRACTION WITH BASKET  12/07/2011   Procedure: CYSTOSCOPY/RETROGRADE/URETEROSCOPY/STONE EXTRACTION WITH BASKET;  Surgeon: Alexis Frock, MD;  Location: [facility];  Service: Urology;  Laterality: Right;  . DILATION AND CURETTAGE OF UTERUS     X5  . EXCISION LEFT BARTHOLIN GLAND  02-05-2002  . EYE SURGERY     Retinal tears surgery bilateral  . LAPAROSCOPIC ASSISTED VAGINAL HYSTERECTOMY  2000  . LAPAROSCOPIC CHOLECYSTECTOMY  05-31-2007  . LAPAROSCOPIC LASER ABLATION ENDOMETRIOSIS AND LEFT SALPINGO-OOPHORECTOMY  11-03-1999  . LAPAROSCOPY WITH RIGHT SALPINGO-OOPHECTOMY/ LYSIS ADHESIONS AND ABLATION ENDOMETRIOSIS  05-17-2001  . LIPOSUCTION Bilateral 03/26/2015   Procedure: WITH LIPOSUCTION;  Surgeon: Wallace Going, DO;  Location: [facility];  Service: Plastics;  Laterality: Bilateral;  . LUMBAR LAMINECTOMY  2003   L4 - L5  . PLANTAR FASCIA SURGERY     right  . PLANTAR FASCIA SURGERY  02/2011   left  . RIGHT URETEROSCOPIC STONE EXTRACTION  08-04-2000  . TONSILLECTOMY    . URETERAL STENT PLACEMENT  11/12/11   Dr Tresa Moore    SOCIAL HISTORY: Social History   Tobacco Use  . Smoking status: Never Smoker  . Smokeless tobacco: Never Used  Substance Use Topics  . Alcohol use: Yes    Alcohol/week: 1.0 standard drinks    Types: 1 Shots of liquor per week    Comment:  very rarely  . Drug use: No    FAMILY HISTORY: Family History  Problem Relation Age of Onset  . Hypertension Mother   . Colon polyps Mother   .  Atrial fibrillation Mother        Dr Angelena Form  . Hyperlipidemia Mother   . Heart disease Mother   . Anxiety disorder Mother   . Sleep apnea Mother   . Obesity Mother   . Heart attack Father 47  . Diabetes Father   . Hypertension Father   . Hyperlipidemia Father   . Heart disease Father   . Stroke Father   . Obesity Father   . Hypertension Sister   . Heart attack Paternal Grandmother 30  . Breast cancer Paternal Aunt   . Colon cancer Maternal Uncle   . Colon cancer Paternal Uncle   . Colon polyps Maternal Grandmother   . Stroke Maternal Grandmother 76  . Diabetes Paternal Grandfather   . Stroke Maternal Grandfather 37    ROS: Review of Systems  Constitutional: Positive for weight loss.  Gastrointestinal: Negative for nausea and vomiting.  Musculoskeletal:       Negative muscle weakness  Endo/Heme/Allergies:       Negative hypoglycemia  Psychiatric/Behavioral: Positive for depression. Negative for suicidal ideas.    PHYSICAL EXAM: Blood pressure 102/68, pulse 82, temperature 98.2 F (36.8 C), temperature source Oral, height '5\' 8"'  (1.727 m), weight 218 lb (98.9 kg), SpO2 98 %. Body mass index is 33.15 kg/m. Physical Exam Vitals signs reviewed.  Constitutional:      Appearance: Normal appearance. She is obese.  Cardiovascular:     Rate and Rhythm: Normal rate.     Pulses: Normal pulses.  Pulmonary:     Effort: Pulmonary effort is normal.     Breath sounds: Normal breath sounds.  Musculoskeletal: Normal range of motion.  Skin:    General: Skin is warm and dry.  Neurological:  Mental Status: She is alert and oriented to person, place, and time.  Psychiatric:        Mood and Affect: Mood normal.        Behavior: Behavior normal.     RECENT LABS AND TESTS: BMET    Component Value Date/Time   NA 140 10/29/2018 1228   K 4.5 10/29/2018 1228   CL 103 10/29/2018 1228   CO2 21 10/29/2018 1228   GLUCOSE 78 10/29/2018 1228   GLUCOSE 92 08/16/2016 1011   BUN  22 10/29/2018 1228   CREATININE 0.67 10/29/2018 1228   CALCIUM 9.4 10/29/2018 1228   GFRNONAA 104 10/29/2018 1228   GFRAA 120 10/29/2018 1228   Lab Results  Component Value Date   HGBA1C 5.2 10/29/2018   HGBA1C 5.4 05/03/2018   HGBA1C 5.4 01/09/2018   HGBA1C 5.8 (H) 07/04/2017   HGBA1C 5.6 02/06/2017   Lab Results  Component Value Date   INSULIN 14.1 10/29/2018   INSULIN 17.8 05/03/2018   INSULIN 15.3 01/09/2018   INSULIN 10.4 07/04/2017   INSULIN 28.1 (H) 02/06/2017   CBC    Component Value Date/Time   WBC 7.6 01/09/2018 1338   WBC 6.6 08/16/2016 1011   RBC 4.69 01/09/2018 1338   RBC 4.68 08/16/2016 1011   HGB 13.1 01/09/2018 1338   HCT 39.5 01/09/2018 1338   PLT 235.0 08/16/2016 1011   MCV 84 01/09/2018 1338   MCH 27.9 01/09/2018 1338   MCH 27.0 04/19/2015 0650   MCHC 33.2 01/09/2018 1338   MCHC 33.4 08/16/2016 1011   RDW 14.7 01/09/2018 1338   LYMPHSABS 2.4 01/09/2018 1338   MONOABS 0.6 08/16/2016 1011   EOSABS 0.2 01/09/2018 1338   BASOSABS 0.1 01/09/2018 1338   Iron/TIBC/Ferritin/ %Sat No results found for: IRON, TIBC, FERRITIN, IRONPCTSAT Lipid Panel     Component Value Date/Time   CHOL 162 10/29/2018 1228   TRIG 76 10/29/2018 1228   HDL 62 10/29/2018 1228   CHOLHDL 3 08/16/2016 1011   VLDL 12.2 08/16/2016 1011   LDLCALC 85 10/29/2018 1228   Hepatic Function Panel     Component Value Date/Time   PROT 6.7 10/29/2018 1228   ALBUMIN 4.4 10/29/2018 1228   AST 13 10/29/2018 1228   ALT 16 10/29/2018 1228   ALKPHOS 92 10/29/2018 1228   BILITOT 0.3 10/29/2018 1228   BILIDIR 0.1 06/12/2013 1632   IBILI 0.2 06/11/2009 2309      Component Value Date/Time   TSH 1.840 01/09/2018 1338   TSH 2.100 07/04/2017 1216   TSH 1.54 08/16/2016 1011      OBESITY BEHAVIORAL INTERVENTION VISIT  Today's visit was # 108   Starting weight: 241 lbs Starting date: 09/28/16 Today's weight : 218 lbs Today's date: 01/24/2019 Total lbs lost to date: 23 At least 15  minutes were spent on discussing the following behavioral intervention visit.   ASK: We discussed the diagnosis of obesity with Karie Mainland today and Simya agreed to give [place] permission to discuss obesity behavioral modification therapy today.  ASSESS: Icelyn has the diagnosis of obesity and her BMI today is 33.15 Ijanae is in the action stage of change   ADVISE: Kendyll was educated on the multiple health risks of obesity as well as the benefit of weight loss to improve her health. She was advised of the need for long term treatment and the importance of lifestyle modifications to improve her current health and to decrease her risk of future health problems.  AGREE: Multiple  dietary modification options and treatment options were discussed and  Kayann agreed to follow the recommendations documented in the above note.  ARRANGE: Aamirah was educated on the importance of frequent visits to treat obesity as outlined per CMS and USPSTF guidelines and agreed to schedule her next follow up appointment today.  I, [name], am acting as transcriptionist for Dennard Nip, MD  I have reviewed the above documentation for accuracy and completeness, and I agree with the above. -Dennard Nip, MD

## 2019-01-28 ENCOUNTER — Encounter (INDEPENDENT_AMBULATORY_CARE_PROVIDER_SITE_OTHER): Payer: Self-pay | Admitting: Family Medicine

## 2019-01-28 MED ORDER — BD PEN NEEDLE NANO U/F 32G X 4 MM MISC: 1.0000 | Freq: Every day | 0 refills | Status: DC

## 2019-01-31 ENCOUNTER — Ambulatory Visit: Payer: PPO

## 2019-02-07 ENCOUNTER — Ambulatory Visit (INDEPENDENT_AMBULATORY_CARE_PROVIDER_SITE_OTHER): Payer: PPO

## 2019-02-07 ENCOUNTER — Other Ambulatory Visit: Payer: Self-pay

## 2019-02-07 DIAGNOSIS — Z23 Encounter for immunization: Secondary | ICD-10-CM

## 2019-02-08 ENCOUNTER — Other Ambulatory Visit (INDEPENDENT_AMBULATORY_CARE_PROVIDER_SITE_OTHER): Payer: Self-pay | Admitting: Family Medicine

## 2019-02-08 DIAGNOSIS — R7303 Prediabetes: Secondary | ICD-10-CM

## 2019-02-17 ENCOUNTER — Other Ambulatory Visit: Payer: Self-pay | Admitting: Internal Medicine

## 2019-02-18 DIAGNOSIS — E669 Obesity, unspecified: Secondary | ICD-10-CM | POA: Diagnosis not present

## 2019-02-18 DIAGNOSIS — Z6833 Body mass index (BMI) 33.0-33.9, adult: Secondary | ICD-10-CM | POA: Diagnosis not present

## 2019-02-18 DIAGNOSIS — M2550 Pain in unspecified joint: Secondary | ICD-10-CM | POA: Diagnosis not present

## 2019-02-18 DIAGNOSIS — M25562 Pain in left knee: Secondary | ICD-10-CM | POA: Diagnosis not present

## 2019-02-18 DIAGNOSIS — Z79899 Other long term (current) drug therapy: Secondary | ICD-10-CM | POA: Diagnosis not present

## 2019-02-18 DIAGNOSIS — M159 Polyosteoarthritis, unspecified: Secondary | ICD-10-CM | POA: Diagnosis not present

## 2019-02-18 DIAGNOSIS — M064 Inflammatory polyarthropathy: Secondary | ICD-10-CM | POA: Diagnosis not present

## 2019-02-21 ENCOUNTER — Other Ambulatory Visit: Payer: Self-pay

## 2019-02-21 ENCOUNTER — Encounter (INDEPENDENT_AMBULATORY_CARE_PROVIDER_SITE_OTHER): Payer: Self-pay | Admitting: Family Medicine

## 2019-02-21 ENCOUNTER — Ambulatory Visit (INDEPENDENT_AMBULATORY_CARE_PROVIDER_SITE_OTHER): Payer: PPO | Admitting: Family Medicine

## 2019-02-21 VITALS — BP 123/80 | HR 73 | Temp 98.6°F | Ht 68.0 in | Wt 217.0 lb

## 2019-02-21 DIAGNOSIS — E669 Obesity, unspecified: Secondary | ICD-10-CM

## 2019-02-21 DIAGNOSIS — E8881 Metabolic syndrome: Secondary | ICD-10-CM | POA: Diagnosis not present

## 2019-02-21 DIAGNOSIS — R5383 Other fatigue: Secondary | ICD-10-CM | POA: Diagnosis not present

## 2019-02-21 DIAGNOSIS — Z6833 Body mass index (BMI) 33.0-33.9, adult: Secondary | ICD-10-CM

## 2019-02-21 DIAGNOSIS — F3289 Other specified depressive episodes: Secondary | ICD-10-CM

## 2019-02-21 DIAGNOSIS — Z9189 Other specified personal risk factors, not elsewhere classified: Secondary | ICD-10-CM | POA: Diagnosis not present

## 2019-02-21 DIAGNOSIS — E559 Vitamin D deficiency, unspecified: Secondary | ICD-10-CM | POA: Diagnosis not present

## 2019-02-21 MED ORDER — GLUCOSE BLOOD VI STRP: ORAL_STRIP | 0 refills | Status: DC

## 2019-02-21 MED ORDER — VITAMIN D (ERGOCALCIFEROL) 1.25 MG (50000 UNIT) PO CAPS: 50000.0000 [IU] | ORAL_CAPSULE | ORAL | 0 refills | Status: DC

## 2019-02-21 MED ORDER — METFORMIN HCL 500 MG PO TABS: 500.0000 mg | ORAL_TABLET | Freq: Two times a day (BID) | ORAL | 0 refills | Status: DC

## 2019-02-21 MED ORDER — LIRAGLUTIDE 18 MG/3ML ~~LOC~~ SOPN: 1.2000 mg | PEN_INJECTOR | Freq: Every morning | SUBCUTANEOUS | 0 refills | Status: DC

## 2019-02-21 MED ORDER — BUPROPION HCL ER (SR) 150 MG PO TB12: 150.0000 mg | ORAL_TABLET | Freq: Every day | ORAL | 0 refills | Status: DC

## 2019-02-22 LAB — CBC WITH DIFFERENTIAL/PLATELET
Basophils Absolute: 0 10*3/uL (ref 0.0–0.2)
Basos: 1 %
EOS (ABSOLUTE): 0.1 10*3/uL (ref 0.0–0.4)
Eos: 2 %
Hematocrit: 37.3 % (ref 34.0–46.6)
Hemoglobin: 12.5 g/dL (ref 11.1–15.9)
Immature Grans (Abs): 0 10*3/uL (ref 0.0–0.1)
Immature Granulocytes: 0 %
Lymphocytes Absolute: 2.1 10*3/uL (ref 0.7–3.1)
Lymphs: 35 %
MCH: 28.7 pg (ref 26.6–33.0)
MCHC: 33.5 g/dL (ref 31.5–35.7)
MCV: 86 fL (ref 79–97)
Monocytes Absolute: 0.6 10*3/uL (ref 0.1–0.9)
Monocytes: 9 %
Neutrophils Absolute: 3.3 10*3/uL (ref 1.4–7.0)
Neutrophils: 53 %
Platelets: 243 10*3/uL (ref 150–450)
RBC: 4.35 x10E6/uL (ref 3.77–5.28)
RDW: 13.2 % (ref 11.7–15.4)
WBC: 6.1 10*3/uL (ref 3.4–10.8)

## 2019-02-22 LAB — TSH: TSH: 1.83 u[IU]/mL (ref 0.450–4.500)

## 2019-02-22 LAB — VITAMIN D 25 HYDROXY (VIT D DEFICIENCY, FRACTURES): Vit D, 25-Hydroxy: 42.6 ng/mL (ref 30.0–100.0)

## 2019-02-22 LAB — T3: T3, Total: 134 ng/dL (ref 71–180)

## 2019-02-22 LAB — HEMOGLOBIN A1C
Est. average glucose Bld gHb Est-mCnc: 108 mg/dL
Hgb A1c MFr Bld: 5.4 % (ref 4.8–5.6)

## 2019-02-22 LAB — T4, FREE: Free T4: 1.14 ng/dL (ref 0.82–1.77)

## 2019-02-22 LAB — INSULIN, RANDOM: INSULIN: 17.3 u[IU]/mL (ref 2.6–24.9)

## 2019-02-26 MED ORDER — GLUCOSE BLOOD VI STRP: ORAL_STRIP | 0 refills | Status: DC

## 2019-02-26 NOTE — Progress Notes (Signed)
Office: [phone]  /  Fax: [phone]   HPI:   Chief Complaint: OBESITY [name] is here to discuss her progress with her obesity treatment plan. She is on the keep a food journal with 1200-1600 calories and 90+ grams of protein daily and is following her eating plan approximately 100 % of the time. She states she is exercising 0 minutes 0 times per week. [name] continues to do well with weight loss. She notes her hunger has decreased and she has been struggling to meet her protein goal.  Her weight is 217 lb (98.4 kg) today and has had a weight loss of 1 pound over a period of 4 weeks since her last visit. She has lost 24 lbs since starting treatment with [place].  Insulin Resistance [name] has a diagnosis of insulin resistance based on her elevated fasting insulin level >5. Although [name]'s blood glucose readings are still under good control, insulin resistance puts her at greater risk of metabolic syndrome and diabetes. She is doing well with diet and is tolerating her medications. She is due for labs.  At risk for diabetes [name] is at higher than average risk for developing diabetes due to her obesity and insulin resistance. She currently denies polyuria or polydipsia.  Vitamin D Deficiency [name] has a diagnosis of vitamin D deficiency. She is currently taking prescription Vit D. Last Vit D was at goal, and she is due for labs. She is at risk of over-replacement. She denies nausea, vomiting or muscle weakness.  Fatigue [name] notes increased generalized fatigue and asks to have have her thyroid rechecked.  Depression  [name]'s mood is stable, but she is struggling with recent suicide death of her teenage niece. She is not eating much right now. She shows no sign of suicidal or homicidal ideations.  ASSESSMENT AND PLAN:  Insulin resistance - Plan: metFORMIN (GLUCOPHAGE) 500 MG tablet, liraglutide (VICTOZA) 18 MG/3ML SOPN, Hemoglobin A1c, Insulin, random, glucose  blood test strip, DISCONTINUED: glucose blood test strip  Vitamin D deficiency - Plan: Vitamin D, Ergocalciferol, (DRISDOL) 1.25 MG (50000 UT) CAPS capsule, Vitamin D (25 hydroxy)  Other fatigue - Plan: CBC with Differential/Platelet, T3, T4, free, TSH  Other depression,with emotional eating  - Plan: buPROPion (WELLBUTRIN SR) 150 MG 12 hr tablet  At risk for diabetes mellitus  Class 1 obesity with serious comorbidity and body mass index (BMI) of 33.0 to 33.9 in adult, unspecified obesity type  PLAN:  Insulin Resistance [name] will continue to work on weight loss, exercise, and decreasing simple carbohydrates in her diet to help decrease the risk of diabetes. We dicussed metformin including benefits and risks. She was informed that eating too many simple carbohydrates or too many calories at one sitting increases the likelihood of GI side effects. [name] agrees to continue taking metformin 500 mg PO BID #90 day supply with no refills, and she agrees to continue Victoza 1.2 mg SubQ AM #6 pens, 90 day supply with no refills. We will refill glucose test strips #100 with no refills. We will check labs today. [name] agrees to follow up with our clinic in 3 to 4 weeks as directed to monitor her progress.  Diabetes risk counseling [name] was given extended (15 minutes) diabetes prevention counseling today. She is 48 y.o. female and has risk factors for diabetes including obesity and insulin resistance. We discussed intensive lifestyle modifications today with an emphasis on weight loss as well as increasing exercise and decreasing simple carbohydrates in her diet.  Vitamin D Deficiency  [name] was informed that low vitamin D levels contributes to fatigue and are associated with obesity, breast, and colon cancer. [name] agrees to continue taking prescription Vit D 50,000 IU every week #12, 90 day supply with no refills. She will follow up for routine testing of vitamin D, at least 2-3 times  per year. She was informed of the risk of over-replacement of vitamin D and agrees to not increase her dose unless she discusses this with [place] first. We will check labs today. [name] agrees to follow up with our clinic in 3 to 4 weeks.  Fatigue [name] was informed that her fatigue may be related to obesity, depression or many other causes. We will check labs today, and in the meanwhile [name] has agreed to work on diet, exercise and weight loss to help with fatigue. Proper sleep hygiene was discussed including the need for 7-8 hours of quality sleep each night.  Depression  We discussed behavior modification techniques today to help [name] deal with her depression. [name] agrees to continue taking Wellbutrin SR 150 mg PO daily #90 day supply with no refills. [name] agrees to follow up with our clinic in 3 to 4 weeks.  Obesity [name] is currently in the action stage of change. As such, her goal is to continue with weight loss efforts She has agreed to keep a food journal with 1200-1600 calories and 90 grams of protein daily [name] has been instructed to work up to a goal of 150 minutes of combined cardio and strengthening exercise per week for weight loss and overall health benefits. We discussed the following Behavioral Modification Strategies today: increasing lean protein intake and holiday eating strategies    [name] has agreed to follow up with our clinic in 3 to 4 weeks. She was informed of the importance of frequent follow up visits to maximize her success with intensive lifestyle modifications for her multiple health conditions.  ALLERGIES: Allergies  Allergen Reactions   Chloraprep One Step [Chlorhexidine Gluconate] Rash    Developed severe rash where chloraprep was used on chest area   Ivp Dye [Iodinated Diagnostic Agents] Rash and Other (See Comments)    Flushing, minor facial rash & dyspnea   Nalbuphine Shortness Of Breath and Rash    Nubain caused respiratory  distress & rash   Septra [Sulfamethoxazole-Trimethoprim] Shortness Of Breath and Rash   Sulfamethoxazole-Trimethoprim Rash    rash   Tylox [Oxycodone-Acetaminophen] Rash   Diclofenac Other (See Comments)    GI Upset   Augmentin [Amoxicillin-Pot Clavulanate] Nausea And Vomiting    MEDICATIONS: Current Outpatient Medications on File Prior to Visit  Medication Sig Dispense Refill   aspirin 81 MG tablet Take 81 mg by mouth daily.      blood glucose meter kit and supplies Dispense based on patient and insurance preference. Use up to two times daily as directed. (FOR ICD-10 E10.9, E11.9). 1 each 0   celecoxib (CELEBREX) 200 MG capsule Take 200 mg by mouth 2 (two) times daily.     citalopram (CELEXA) 20 MG tablet TAKE 1 TABLET BY MOUTH EVERY DAY 90 tablet 0   clobetasol cream (TEMOVATE) 2.01 % Apply 1 application topically 2 (two) times daily. 30 g 0   famotidine (PEPCID) 20 MG tablet Take 2 tablets (40 mg total) by mouth daily. -- Office visit needed for further refills 180 tablet 0   Fluticasone-Salmeterol (ADVAIR DISKUS) 250-50 MCG/DOSE AEPB Inhale 1 puff into the lungs 2 (two) times daily. 1 each 3   hydrochlorothiazide (MICROZIDE)  12.5 MG capsule TAKE 1 CAPSULE BY MOUTH EVERY DAY. Need office visit for more refills. 30 capsule 0   hydroxychloroquine (PLAQUENIL) 200 MG tablet Take 200 mg by mouth 2 (two) times daily.     Insulin Pen Needle (BD PEN NEEDLE NANO U/F) 32G X 4 MM MISC 1 each by Other route daily. 100 each 0   linaclotide (LINZESS) 72 MCG capsule Take 1 capsule (72 mcg total) by mouth daily before breakfast. 30 capsule 5   lisinopril (ZESTRIL) 40 MG tablet TAKE 1 TABLET BY MOUTH EVERY DAY 90 tablet 0   montelukast (SINGULAIR) 10 MG tablet Take 1 tablet by mouth everyday at bedtime. Need office visit for more refills. 30 tablet 1   nitrofurantoin, macrocrystal-monohydrate, (MACROBID) 100 MG capsule Take 1 capsule (100 mg total) by mouth 2 (two) times daily. 14  capsule 0   OneTouch Delica Lancets 32T MISC 1 each by Does not apply route 2 (two) times daily. 100 each 0   polyethylene glycol powder (GLYCOLAX/MIRALAX) powder Take 17 g by mouth daily. 3350 g 0   PROAIR HFA 108 (90 Base) MCG/ACT inhaler TAKE 2 PUFFS BY MOUTH EVERY 6 HOURS AS NEEDED FOR WHEEZE OR SHORTNESS OF BREATH 8.5 Inhaler 5   No current facility-administered medications on file prior to visit.     PAST MEDICAL HISTORY: Past Medical History:  Diagnosis Date   Anemia    Anxiety    Arthritis    inflammitory   Asthma    B12 deficiency    Back pain    Chronic joint pain    Constipation    DDD (degenerative disc disease), lumbar    low back and neck and shoulders   Depression    during divorce & legal matters   Fatty liver    Fibromyalgia    Gallbladder problem    GERD (gastroesophageal reflux disease)    History of kidney stones    Dr Phebe Colla   History of polycystic ovarian disease S/P BSO   Hx of anxiety disorder    Hypertension    a   Hypothyroidism    no meds   IBS (irritable bowel syndrome)    IBS (irritable bowel syndrome)    Infertility, female    Joint pain    Kidney problem    Lactose intolerance    Lupus (HCC) hx positive ANA   followed by Dr Gavin Pound; possible lupus   Multiple food allergies    Myalgia    Osteoarthritis    Polyarthritis, 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[place])    POLYCYSTIC OVARIAN DISEASE 03/02/2007   Qualifier: Diagnosis of  By: Linna Darner MD, Rae Mar BSO for cysts & endometriosis; Dr Reino Kent, Gyn Seeing Dr Paula Compton     PONV (postoperative nausea and vomiting)    Prediabetes    Sweating profusely    Symptomatic mammary hypertrophy    URI (upper respiratory infection)    currently on cefdinir    PAST SURGICAL HISTORY: Past Surgical History:  Procedure Laterality Date   ANKLE SURGERY     X3   APPENDECTOMY  1991   exploratory lap   BREAST REDUCTION SURGERY Bilateral 03/26/2015    Procedure: BILATERAL BREAST REDUCTION ;  Surgeon: Wallace Going, DO;  Location: [place];  Service: Plastics;  Laterality: Bilateral;   CARPAL TUNNEL RELEASE     bilateral; Dr Sherwood Gambler   COLONOSCOPY     in [place]  07-09-2004  HX BILATERAL RENAL STONES/ RIGHT FLANK PAIN   CYSTOSCOPY W/ URETERAL STENT PLACEMENT  11/12/2011   Procedure: CYSTOSCOPY WITH RETROGRADE PYELOGRAM/URETERAL STENT PLACEMENT;  Surgeon: Alexis Frock, MD;  Location: [facility];  Service: Urology;  Laterality: Right;   CYSTOSCOPY W/ URETERAL STENT REMOVAL  12/07/2011   Procedure: CYSTOSCOPY WITH STENT REMOVAL;  Surgeon: Alexis Frock, MD;  Location[facility];  Service: Urology;  Laterality: Right;   CYSTOSCOPY/RETROGRADE/URETEROSCOPY/STONE EXTRACTION WITH BASKET  12/07/2011   Procedure: CYSTOSCOPY/RETROGRADE/URETEROSCOPY/STONE EXTRACTION WITH BASKET;  Surgeon: Alexis Frock, MD;  [facility];  Service: Urology;  Laterality: Right;   DILATION AND CURETTAGE OF UTERUS     X5   EXCISION LEFT BARTHOLIN GLAND  02-05-2002   EYE SURGERY     Retinal tears surgery bilateral   LAPAROSCOPIC ASSISTED VAGINAL HYSTERECTOMY  2000   LAPAROSCOPIC CHOLECYSTECTOMY  05-31-2007   LAPAROSCOPIC LASER ABLATION ENDOMETRIOSIS AND LEFT SALPINGO-OOPHORECTOMY  11-03-1999   LAPAROSCOPY WITH RIGHT SALPINGO-OOPHECTOMY/ LYSIS ADHESIONS AND ABLATION ENDOMETRIOSIS  05-17-2001   LIPOSUCTION Bilateral 03/26/2015   Procedure: WITH LIPOSUCTION;  Surgeon: Wallace Going, DO;  Location: [place];  Service: Plastics;  Laterality: Bilateral;   LUMBAR LAMINECTOMY  2003   L4 - L5   PLANTAR FASCIA SURGERY     right   PLANTAR FASCIA SURGERY  02/2011   left   RIGHT URETEROSCOPIC STONE EXTRACTION  08-04-2000   TONSILLECTOMY     URETERAL STENT PLACEMENT  11/12/11   Dr Tresa Moore    SOCIAL HISTORY: Social History   Tobacco  Use   Smoking status: Never Smoker   Smokeless tobacco: Never Used  Substance Use Topics   Alcohol use: Yes    Alcohol/week: 1.0 standard drinks    Types: 1 Shots of liquor per week    Comment:  very rarely   Drug use: No    FAMILY HISTORY: Family History  Problem Relation Age of Onset   Hypertension Mother    Colon polyps Mother    Atrial fibrillation Mother        Dr Angelena Form   Hyperlipidemia Mother    Heart disease Mother    Anxiety disorder Mother    Sleep apnea Mother    Obesity Mother    Heart attack Father 24   Diabetes Father    Hypertension Father    Hyperlipidemia Father    Heart disease Father    Stroke Father    Obesity Father    Hypertension Sister    Heart attack Paternal Grandmother 57   Breast cancer Paternal Aunt    Colon cancer Maternal Uncle    Colon cancer Paternal Uncle    Colon polyps Maternal Grandmother    Stroke Maternal Grandmother 76   Diabetes Paternal Grandfather    Stroke Maternal Grandfather 37    ROS: Review of Systems  Constitutional: Positive for malaise/fatigue and weight loss.  Gastrointestinal: Negative for nausea and vomiting.  Genitourinary: Negative for frequency.  Musculoskeletal:       Negative muscle weakness  Endo/Heme/Allergies: Negative for polydipsia.  Psychiatric/Behavioral: Positive for depression. Negative for suicidal ideas.    PHYSICAL EXAM: Blood pressure 123/80, pulse 73, temperature 98.6 F (37 C), temperature source Oral, height '5\' 8"'  (1.727 m), weight 217 lb (98.4 kg), SpO2 97 %. Body mass index is 32.99 kg/m. Physical Exam Vitals signs reviewed.  Constitutional:      Appearance: Normal appearance. She is obese.  Cardiovascular:     Rate and Rhythm: Normal rate.  Pulses: Normal pulses.  Pulmonary:     Effort: Pulmonary effort is normal.     Breath sounds: Normal breath sounds.  Musculoskeletal: Normal range of motion.  Skin:    General: Skin is warm and dry.    Neurological:     Mental Status: She is alert and oriented to person, place, and time.  Psychiatric:        Mood and Affect: Mood normal.        Behavior: Behavior normal.     RECENT LABS AND TESTS: BMET    Component Value Date/Time   NA 140 10/29/2018 1228   K 4.5 10/29/2018 1228   CL 103 10/29/2018 1228   CO2 21 10/29/2018 1228   GLUCOSE 78 10/29/2018 1228   GLUCOSE 92 08/16/2016 1011   BUN 22 10/29/2018 1228   CREATININE 0.67 10/29/2018 1228   CALCIUM 9.4 10/29/2018 1228   GFRNONAA 104 10/29/2018 1228   GFRAA 120 10/29/2018 1228   Lab Results  Component Value Date   HGBA1C 5.4 02/21/2019   HGBA1C 5.2 10/29/2018   HGBA1C 5.4 05/03/2018   HGBA1C 5.4 01/09/2018   HGBA1C 5.8 (H) 07/04/2017   Lab Results  Component Value Date   INSULIN 17.3 02/21/2019   INSULIN 14.1 10/29/2018   INSULIN 17.8 05/03/2018   INSULIN 15.3 01/09/2018   INSULIN 10.4 07/04/2017   CBC    Component Value Date/Time   WBC 6.1 02/21/2019 0823   WBC 6.6 08/16/2016 1011   RBC 4.35 02/21/2019 0823   RBC 4.68 08/16/2016 1011   HGB 12.5 02/21/2019 0823   HCT 37.3 02/21/2019 0823   PLT 243 02/21/2019 0823   MCV 86 02/21/2019 0823   MCH 28.7 02/21/2019 0823   MCH 27.0 04/19/2015 0650   MCHC 33.5 02/21/2019 0823   MCHC 33.4 08/16/2016 1011   RDW 13.2 02/21/2019 0823   LYMPHSABS 2.1 02/21/2019 0823   MONOABS 0.6 08/16/2016 1011   EOSABS 0.1 02/21/2019 0823   BASOSABS 0.0 02/21/2019 0823   Iron/TIBC/Ferritin/ %Sat No results found for: IRON, TIBC, FERRITIN, IRONPCTSAT Lipid Panel     Component Value Date/Time   CHOL 162 10/29/2018 1228   TRIG 76 10/29/2018 1228   HDL 62 10/29/2018 1228   CHOLHDL 3 08/16/2016 1011   VLDL 12.2 08/16/2016 1011   LDLCALC 85 10/29/2018 1228   Hepatic Function Panel     Component Value Date/Time   PROT 6.7 10/29/2018 1228   ALBUMIN 4.4 10/29/2018 1228   AST 13 10/29/2018 1228   ALT 16 10/29/2018 1228   ALKPHOS 92 10/29/2018 1228   BILITOT 0.3  10/29/2018 1228   BILIDIR 0.1 06/12/2013 1632   IBILI 0.2 06/11/2009 2309      Component Value Date/Time   TSH 1.830 02/21/2019 0823   TSH 1.840 01/09/2018 1338   TSH 2.100 07/04/2017 1216      OBESITY BEHAVIORAL INTERVENTION VISIT  Today's visit was # 40   Starting weight: 241 lbs Starting date: 09/28/16 Today's weight : 217 lbs  Today's date: 02/21/2019 Total lbs lost to date: 24    ASK: We discussed the diagnosis of obesity with [name] today and [name] agreed to give [place] permission to discuss obesity behavioral modification therapy today.  ASSESS: Breasia has the diagnosis of obesity and her BMI today is 25 [name] is in the action stage of change   ADVISE: [name] was educated on the multiple health risks of obesity as well as the benefit of weight loss to improve  her health. She was advised of the need for long term treatment and the importance of lifestyle modifications to improve her current health and to decrease her risk of future health problems.  AGREE: Multiple dietary modification options and treatment options were discussed and  Dellamae agreed to follow the recommendations documented in the above note.  ARRANGE: Renika was educated on the importance of frequent visits to treat obesity as outlined per CMS and USPSTF guidelines and agreed to schedule her next follow up appointment today.  I, Trixie Dredge, am acting as transcriptionist for Dennard Nip, MD  I have reviewed the above documentation for accuracy and completeness, and I agree with the above. -Dennard Nip, MD

## 2019-03-13 ENCOUNTER — Other Ambulatory Visit: Payer: Self-pay | Admitting: Internal Medicine

## 2019-03-16 ENCOUNTER — Other Ambulatory Visit (INDEPENDENT_AMBULATORY_CARE_PROVIDER_SITE_OTHER): Payer: Self-pay | Admitting: Family Medicine

## 2019-03-16 DIAGNOSIS — R7303 Prediabetes: Secondary | ICD-10-CM

## 2019-03-18 ENCOUNTER — Encounter (INDEPENDENT_AMBULATORY_CARE_PROVIDER_SITE_OTHER): Payer: Self-pay | Admitting: Family Medicine

## 2019-03-18 ENCOUNTER — Other Ambulatory Visit: Payer: Self-pay

## 2019-03-18 ENCOUNTER — Ambulatory Visit (INDEPENDENT_AMBULATORY_CARE_PROVIDER_SITE_OTHER): Payer: PPO | Admitting: Family Medicine

## 2019-03-18 VITALS — BP 133/84 | HR 78 | Temp 98.6°F | Ht 68.0 in | Wt 218.0 lb

## 2019-03-18 DIAGNOSIS — E669 Obesity, unspecified: Secondary | ICD-10-CM

## 2019-03-18 DIAGNOSIS — Z6833 Body mass index (BMI) 33.0-33.9, adult: Secondary | ICD-10-CM

## 2019-03-18 DIAGNOSIS — E559 Vitamin D deficiency, unspecified: Secondary | ICD-10-CM

## 2019-03-18 DIAGNOSIS — F3289 Other specified depressive episodes: Secondary | ICD-10-CM

## 2019-03-18 DIAGNOSIS — R7303 Prediabetes: Secondary | ICD-10-CM | POA: Diagnosis not present

## 2019-03-18 MED ORDER — LIRAGLUTIDE 18 MG/3ML ~~LOC~~ SOPN: 1.2000 mg | PEN_INJECTOR | Freq: Every morning | SUBCUTANEOUS | 0 refills | Status: DC

## 2019-03-18 MED ORDER — VITAMIN D (ERGOCALCIFEROL) 1.25 MG (50000 UNIT) PO CAPS: 50000.0000 [IU] | ORAL_CAPSULE | ORAL | 0 refills | Status: DC

## 2019-03-18 MED ORDER — METFORMIN HCL 500 MG PO TABS: 500.0000 mg | ORAL_TABLET | Freq: Two times a day (BID) | ORAL | 0 refills | Status: DC

## 2019-03-18 MED ORDER — BUPROPION HCL ER (SR) 150 MG PO TB12: 150.0000 mg | ORAL_TABLET | Freq: Every day | ORAL | 0 refills | Status: DC

## 2019-03-18 NOTE — Progress Notes (Signed)
Office: [phone]  /  Fax: [phone]   HPI:  Chief Complaint: OBESITY [name] is here to discuss her progress with her obesity treatment plan. She is keeping a food journal with 1200-1600 calories and 90 grams of protein and states she is following her eating plan approximately 90% of the time. She states she is walking 10,000 steps 4 times per week.  [name] continues to work on [employer], [employer], but is having some issues with GI upset, especially with meat, so meeting her protein goal has been difficult. She has been on prednisone recently, which contributes to weight gain.  Today's visit was #41 Starting weight: 241 lbs Starting date: 09/28/2016 Today's weight: 218 lbs  Today's date: 03/18/2019 Total lbs lost to date: 23  Total lbs lost since last in-office visit: 0  Prediabetes [name]'s A1c is still controlled at 5.4, and she is stable on medications. She has a strongly family history of diabetes mellitus. She had some issues with feeling hypoglycemic, which resolved quickly with peanut butter crackers. We discussed labs with her today.  Vitamin D deficiency [name] has a diagnosis of Vitamin D deficiency, which is worsening. Her Vitamin D level dropped from 50.5 on 10/29/2018 to 42.6 on 02/21/2019 and is no longer at goal. No nausea, vomiting, or muscle weakness. Labs were discussed with her today.  Emotional Eating [name] is stable on Wellbutrin. Her blood pressure is controlled and heart rate is normal.  ASSESSMENT AND PLAN:  Prediabetes - Plan: liraglutide (VICTOZA) 18 MG/3ML SOPN, metFORMIN (GLUCOPHAGE) 500 MG tablet  Vitamin D deficiency - Plan: Vitamin D, Ergocalciferol, (DRISDOL) 1.25 MG (50000 UT) CAPS capsule  Other depression,with emotional eating  - Plan: buPROPion (WELLBUTRIN SR) 150 MG 12 hr tablet  Class 1 obesity with serious comorbidity and body mass index (BMI) of 33.0 to 33.9 in adult, unspecified obesity type  PLAN:  Pre-Diabetes [name] will  continue to work on weight loss, exercise, and decreasing simple carbohydrates to help decrease the risk of diabetes. She was given refills on her metformin and Victoza and agrees to follow-up with our clinic in 3-4 weeks. She will continue diet and exercise.  Vitamin D Deficiency [name] was informed that low Vitamin D levels contributes to fatigue and are associated with obesity, breast, and colon cancer. She agrees to continue to take prescription Vit D @ 50,000 IU every week #4 with 0 refills and will follow-up for routine testing of Vitamin D in 3 months. She was informed of the risk of over-replacement of Vitamin D and agrees to not increase her dose unless she discusses this with [place] first. [name] agrees to follow-up with our clinic in 3-4 weeks.  Emotional Eating Behaviors (other depression) Behavior modification techniques were discussed today to help [name] deal with her emotional/non-hunger eating behaviors. [name] was given a refill on her Wellbutrin 150 mg #30 with 0 refills and agrees to follow-up with our clinic in 3-4 weeks. Will continue to follow and monitor her progress.  Obesity [name] is currently in the action stage of change. As such, her goal is to continue with weight loss efforts. She has agreed to keep a food journal with 1200-1400 calories and 90+ grams of protein.  [name] has been instructed to work up to a goal of 150 minutes of combined cardio and strengthening exercise per week for weight loss and overall health benefits. We discussed the following Behavioral Modification Strategies today: increasing lean protein intake, decreasing simple carbohydrates, and holiday eating strategies.   [name] has agreed to  follow-up with our clinic in 3-4 weeks. She was informed of the importance of frequent follow-up visits to maximize her success with intensive lifestyle modifications for her multiple health conditions.  ALLERGIES: Allergies  Allergen Reactions  .  Chloraprep One Step [Chlorhexidine Gluconate] Rash    Developed severe rash where chloraprep was used on chest area  . Ivp Dye [Iodinated Diagnostic Agents] Rash and Other (See Comments)    Flushing, minor facial rash & dyspnea  . Nalbuphine Shortness Of Breath and Rash    Nubain caused respiratory distress & rash  . Septra [Sulfamethoxazole-Trimethoprim] Shortness Of Breath and Rash  . Sulfamethoxazole-Trimethoprim Rash    rash  . Tylox [Oxycodone-Acetaminophen] Rash  . Diclofenac Other (See Comments)    GI Upset  . Augmentin [Amoxicillin-Pot Clavulanate] Nausea And Vomiting    MEDICATIONS: Current Outpatient Medications on File Prior to Visit  Medication Sig Dispense Refill  . aspirin 81 MG tablet Take 81 mg by mouth daily.     . blood glucose meter kit and supplies Dispense based on patient and insurance preference. Use up to two times daily as directed. (FOR ICD-10 E10.9, E11.9). 1 each 0  . celecoxib (CELEBREX) 200 MG capsule Take 200 mg by mouth 2 (two) times daily.    . citalopram (CELEXA) 20 MG tablet TAKE 1 TABLET BY MOUTH EVERY DAY 90 tablet 0  . clobetasol cream (TEMOVATE) 4.33 % Apply 1 application topically 2 (two) times daily. 30 g 0  . famotidine (PEPCID) 20 MG tablet Take 2 tablets (40 mg total) by mouth daily. -- Office visit needed for further refills 180 tablet 0  . Fluticasone-Salmeterol (ADVAIR DISKUS) 250-50 MCG/DOSE AEPB Inhale 1 puff into the lungs 2 (two) times daily. 1 each 3  . glucose blood test strip Test blood sugars once a day 100 each 0  . hydrochlorothiazide (MICROZIDE) 12.5 MG capsule TAKE 1 CAPSULE BY MOUTH EVERY DAY *NEED OFFICE VISIT* 30 capsule 0  . hydroxychloroquine (PLAQUENIL) 200 MG tablet Take 200 mg by mouth 2 (two) times daily.    . Insulin Pen Needle (BD PEN NEEDLE NANO U/F) 32G X 4 MM MISC 1 each by Other route daily. 100 each 0  . linaclotide (LINZESS) 72 MCG capsule Take 1 capsule (72 mcg total) by mouth daily before breakfast. 30 capsule  5  . lisinopril (ZESTRIL) 40 MG tablet TAKE 1 TABLET BY MOUTH EVERY DAY 90 tablet 0  . montelukast (SINGULAIR) 10 MG tablet TAKE 1 TABLET BY MOUTH EVERY DAY AT BEDTIME *NEED OFFICE VISIT* 30 tablet 1  . nitrofurantoin, macrocrystal-monohydrate, (MACROBID) 100 MG capsule Take 1 capsule (100 mg total) by mouth 2 (two) times daily. 14 capsule 0  . OneTouch Delica Lancets 29J MISC 1 each by Does not apply route 2 (two) times daily. 100 each 0  . polyethylene glycol powder (GLYCOLAX/MIRALAX) powder Take 17 g by mouth daily. 3350 g 0  . predniSONE (DELTASONE) 5 MG tablet Take 5 mg by mouth. Tapered dose over 6 days    . PROAIR HFA 108 (90 Base) MCG/ACT inhaler TAKE 2 PUFFS BY MOUTH EVERY 6 HOURS AS NEEDED FOR WHEEZE OR SHORTNESS OF BREATH 8.5 Inhaler 5   No current facility-administered medications on file prior to visit.    PAST MEDICAL HISTORY: Past Medical History:  Diagnosis Date  . Anemia   . Anxiety   . Arthritis    inflammitory  . Asthma   . B12 deficiency   . Back pain   . Chronic joint  pain   . Constipation   . DDD (degenerative disc disease), lumbar    low back and neck and shoulders  . Depression    during divorce & legal matters  . Fatty liver   . Fibromyalgia   . Gallbladder problem   . GERD (gastroesophageal reflux disease)   . History of kidney stones    Dr Phebe Colla  . History of polycystic ovarian disease S/P BSO  . Hx of anxiety disorder   . Hypertension    a  . Hypothyroidism    no meds  . IBS (irritable bowel syndrome)   . IBS (irritable bowel syndrome)   . Infertility, female   . Joint pain   . Kidney problem   . Lactose intolerance   . Lupus (HCC) hx positive ANA   followed by Dr Gavin Pound; possible lupus  . Multiple food allergies   . Myalgia   . Osteoarthritis   . 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 POLYCYSTIC OVARIAN DISEASE 03/02/2007   Qualifier: Diagnosis of  By: Linna Darner MD, Rae Mar BSO for cysts & endometriosis; Dr Reino Kent, Gyn  Seeing Dr Paula Compton    . PONV (postoperative nausea and vomiting)   . Prediabetes   . Sweating profusely   . Symptomatic mammary hypertrophy   . URI (upper respiratory infection)    currently on cefdinir    PAST SURGICAL HISTORY: Past Surgical History:  Procedure Laterality Date  . [place]   exploratory lap  . BREAST REDUCTION SURGERY Bilateral 03/26/2015   Procedure: BILATERAL BREAST REDUCTION ;  Surgeon: Wallace Going, DO;  Location: [place];  Service: Plastics;  Laterality: Bilateral;  . CARPAL TUNNEL RELEASE     bilateral; Dr Sherwood Gambler  . COLONOSCOPY     in 1990s  . CYSTO/ RIGHT RETROGRADE URETERAL PYELOGRAM  07-09-2004   HX BILATERAL RENAL STONES/ RIGHT FLANK PAIN  . CYSTOSCOPY W/ URETERAL STENT PLACEMENT  11/12/2011   Procedure: CYSTOSCOPY WITH RETROGRADE PYELOGRAM/URETERAL STENT PLACEMENT;  Surgeon: Alexis Frock, MD;  Location: [facility];  Service: Urology;  Laterality: Right;  . CYSTOSCOPY W/ URETERAL STENT REMOVAL  12/07/2011   Procedure: CYSTOSCOPY WITH STENT REMOVAL;  Surgeon: Alexis Frock, MD;  Location: [facility];  Service: Urology;  Laterality: Right;  . CYSTOSCOPY/RETROGRADE/URETEROSCOPY/STONE EXTRACTION WITH BASKET  12/07/2011   Procedure: CYSTOSCOPY/RETROGRADE/URETEROSCOPY/STONE EXTRACTION WITH BASKET;  Surgeon: Alexis Frock, MD;  Location[facility];  Service: Urology;  Laterality: Right;  . DILATION AND CURETTAGE OF UTERUS     X5  . EXCISION LEFT BARTHOLIN GLAND  02-05-2002  . EYE SURGERY     Retinal tears surgery bilateral  . LAPAROSCOPIC ASSISTED VAGINAL HYSTERECTOMY  2000  . LAPAROSCOPIC CHOLECYSTECTOMY  05-31-2007  . LAPAROSCOPIC LASER ABLATION ENDOMETRIOSIS AND LEFT SALPINGO-OOPHORECTOMY  11-03-1999  . LAPAROSCOPY WITH RIGHT SALPINGO-OOPHECTOMY/ LYSIS ADHESIONS AND ABLATION ENDOMETRIOSIS  05-17-2001  . LIPOSUCTION Bilateral 03/26/2015   Procedure: WITH  LIPOSUCTION;  Surgeon: Wallace Going, DO;  Location: [place];  Service: Plastics;  Laterality: Bilateral;  . LUMBAR LAMINECTOMY  2003   L4 - L5  . PLANTAR FASCIA SURGERY     right  . PLANTAR FASCIA SURGERY  02/2011   left  . RIGHT URETEROSCOPIC STONE EXTRACTION  08-04-2000  . TONSILLECTOMY    . URETERAL STENT PLACEMENT  11/12/11   Dr Tresa Moore    SOCIAL HISTORY: Social History   Tobacco  Use  . Smoking status: Never Smoker  . Smokeless tobacco: Never Used  Substance Use Topics  . Alcohol use: Yes    Alcohol/week: 1.0 standard drinks    Types: 1 Shots of liquor per week    Comment:  very rarely  . Drug use: No    FAMILY HISTORY: Family History  Problem Relation Age of Onset  . Hypertension Mother   . Colon polyps Mother   . Atrial fibrillation Mother        Dr Angelena Form  . Hyperlipidemia Mother   . Heart disease Mother   . Anxiety disorder Mother   . Sleep apnea Mother   . Obesity Mother   . Heart attack Father 51  . Diabetes Father   . Hypertension Father   . Hyperlipidemia Father   . Heart disease Father   . Stroke Father   . Obesity Father   . Hypertension Sister   . Heart attack Paternal Grandmother 52  . Breast cancer Paternal Aunt   . Colon cancer Maternal Uncle   . Colon cancer Paternal Uncle   . Colon polyps Maternal Grandmother   . Stroke Maternal Grandmother 76  . Diabetes Paternal Grandfather   . Stroke Maternal Grandfather 37   ROS: Review of Systems  Gastrointestinal: Negative for nausea and vomiting.  Musculoskeletal:       Negative for muscle weakness.   PHYSICAL EXAM: Blood pressure 133/84, pulse 78, temperature 98.6 F (37 C), temperature source Oral, height _0  (1.727 m), weight 218 lb (98.9 kg), SpO2 98 %. Body mass index is 33.15 kg/m. Physical Exam Vitals reviewed.  Constitutional:      Appearance: Normal appearance. She is obese.  Cardiovascular:     Rate and Rhythm: Normal rate.     Pulses: Normal  pulses.  Pulmonary:     Effort: Pulmonary effort is normal.     Breath sounds: Normal breath sounds.  Musculoskeletal:        General: Normal range of motion.  Skin:    General: Skin is warm and dry.  Neurological:     Mental Status: She is alert and oriented to person, place, and time.  Psychiatric:        Behavior: Behavior normal.   RECENT LABS AND TESTS: BMET    Component Value Date/Time   NA 140 10/29/2018 1228   K 4.5 10/29/2018 1228   CL 103 10/29/2018 1228   CO2 21 10/29/2018 1228   GLUCOSE 78 10/29/2018 1228   GLUCOSE 92 08/16/2016 1011   BUN 22 10/29/2018 1228   CREATININE 0.67 10/29/2018 1228   CALCIUM 9.4 10/29/2018 1228   GFRNONAA 104 10/29/2018 1228   GFRAA 120 10/29/2018 1228   Lab Results  Component Value Date   HGBA1C 5.4 02/21/2019   HGBA1C 5.2 10/29/2018   HGBA1C 5.4 05/03/2018   HGBA1C 5.4 01/09/2018   HGBA1C 5.8 (H) 07/04/2017   Lab Results  Component Value Date   INSULIN 17.3 02/21/2019   INSULIN 14.1 10/29/2018   INSULIN 17.8 05/03/2018   INSULIN 15.3 01/09/2018   INSULIN 10.4 07/04/2017   CBC    Component Value Date/Time   WBC 6.1 02/21/2019 0823   WBC 6.6 08/16/2016 1011   RBC 4.35 02/21/2019 0823   RBC 4.68 08/16/2016 1011   HGB 12.5 02/21/2019 0823   HCT 37.3 02/21/2019 0823   PLT 243 02/21/2019 0823   MCV 86 02/21/2019 0823   MCH 28.7 02/21/2019 0823   MCH 27.0 04/19/2015 0650  MCHC 33.5 02/21/2019 0823   MCHC 33.4 08/16/2016 1011   RDW 13.2 02/21/2019 0823   LYMPHSABS 2.1 02/21/2019 0823   MONOABS 0.6 08/16/2016 1011   EOSABS 0.1 02/21/2019 0823   BASOSABS 0.0 02/21/2019 0823   Iron/TIBC/Ferritin/ %Sat No results found for: IRON, TIBC, FERRITIN, IRONPCTSAT Lipid Panel     Component Value Date/Time   CHOL 162 10/29/2018 1228   TRIG 76 10/29/2018 1228   HDL 62 10/29/2018 1228   CHOLHDL 3 08/16/2016 1011   VLDL 12.2 08/16/2016 1011   LDLCALC 85 10/29/2018 1228   Hepatic Function Panel     Component Value  Date/Time   PROT 6.7 10/29/2018 1228   ALBUMIN 4.4 10/29/2018 1228   AST 13 10/29/2018 1228   ALT 16 10/29/2018 1228   ALKPHOS 92 10/29/2018 1228   BILITOT 0.3 10/29/2018 1228   BILIDIR 0.1 06/12/2013 1632   IBILI 0.2 06/11/2009 2309      Component Value Date/Time   TSH 1.830 02/21/2019 0823   TSH 1.840 01/09/2018 1338   TSH 2.100 07/04/2017 1216    OBESITY BEHAVIORAL INTERVENTION VISIT DOCUMENTATION FOR INSURANCE (~15 minutes)  ASK: We discussed the diagnosis of obesity with Karie Mainland today and Aprill agreed to give [place] permission to discuss obesity behavioral modification therapy today.  ASSESS: Soni has the diagnosis of obesity and her BMI today is 33.1. Lirio is in the action stage of change.   ADVISE: Jodee was educated on the multiple health risks of obesity as well as the benefit of weight loss to improve her health. She was advised of the need for long term treatment and the importance of lifestyle modifications to improve her current health and to decrease her risk of future health problems.  AGREE: Multiple dietary modification options and treatment options were discussed and  Ignacia agreed to follow the recommendations documented in the above note.  ARRANGE: Najat was educated on the importance of frequent visits to treat obesity as outlined per CMS and USPSTF guidelines and agreed to schedule her next follow up appointment today.  I, Michaelene Song, am acting as Location manager for Dennard Nip, MD I have reviewed the above documentation for accuracy and completeness, and I agree with the above. -Dennard Nip, MD

## 2019-03-26 ENCOUNTER — Other Ambulatory Visit: Payer: Self-pay | Admitting: Internal Medicine

## 2019-04-05 ENCOUNTER — Other Ambulatory Visit: Payer: Self-pay | Admitting: Internal Medicine

## 2019-04-09 ENCOUNTER — Other Ambulatory Visit: Payer: Self-pay | Admitting: Obstetrics and Gynecology

## 2019-04-09 DIAGNOSIS — Z1231 Encounter for screening mammogram for malignant neoplasm of breast: Secondary | ICD-10-CM

## 2019-04-12 ENCOUNTER — Other Ambulatory Visit (INDEPENDENT_AMBULATORY_CARE_PROVIDER_SITE_OTHER): Payer: Self-pay | Admitting: Family Medicine

## 2019-04-12 DIAGNOSIS — E559 Vitamin D deficiency, unspecified: Secondary | ICD-10-CM

## 2019-04-15 ENCOUNTER — Encounter (INDEPENDENT_AMBULATORY_CARE_PROVIDER_SITE_OTHER): Payer: Self-pay | Admitting: Family Medicine

## 2019-04-15 ENCOUNTER — Ambulatory Visit (INDEPENDENT_AMBULATORY_CARE_PROVIDER_SITE_OTHER): Payer: PPO | Admitting: Family Medicine

## 2019-04-15 ENCOUNTER — Other Ambulatory Visit: Payer: Self-pay

## 2019-04-15 VITALS — BP 123/81 | HR 75 | Temp 98.6°F | Ht 68.0 in | Wt 221.0 lb

## 2019-04-15 DIAGNOSIS — E669 Obesity, unspecified: Secondary | ICD-10-CM

## 2019-04-15 DIAGNOSIS — Z9189 Other specified personal risk factors, not elsewhere classified: Secondary | ICD-10-CM

## 2019-04-15 DIAGNOSIS — F3289 Other specified depressive episodes: Secondary | ICD-10-CM | POA: Diagnosis not present

## 2019-04-15 DIAGNOSIS — E8881 Metabolic syndrome: Secondary | ICD-10-CM | POA: Diagnosis not present

## 2019-04-15 DIAGNOSIS — Z6833 Body mass index (BMI) 33.0-33.9, adult: Secondary | ICD-10-CM

## 2019-04-15 DIAGNOSIS — E559 Vitamin D deficiency, unspecified: Secondary | ICD-10-CM

## 2019-04-15 MED ORDER — BUPROPION HCL ER (SR) 150 MG PO TB12: 150.0000 mg | ORAL_TABLET | Freq: Every day | ORAL | 0 refills | Status: DC

## 2019-04-15 MED ORDER — VITAMIN D (ERGOCALCIFEROL) 1.25 MG (50000 UNIT) PO CAPS: 50000.0000 [IU] | ORAL_CAPSULE | ORAL | 0 refills | Status: DC

## 2019-04-15 MED ORDER — METFORMIN HCL 500 MG PO TABS: 500.0000 mg | ORAL_TABLET | Freq: Two times a day (BID) | ORAL | 0 refills | Status: DC

## 2019-04-17 NOTE — Progress Notes (Signed)
Chief Complaint:   OBESITY [name] is here to discuss her progress with her obesity treatment plan along with follow-up of her obesity related diagnoses. [name] is keeping a food journal and adhering to recommended goals of 1200-1400 calories and 90+ grams of protein daily and states she is following her eating plan approximately 50% of the time. [name] states she is doing 0 minutes 0 times per week.  Today's visit was #: 6 Starting weight: 241 lbs Starting date: 09/28/16 Today's weight: 221 lbs Today's date: 04/15/2019 Total lbs lost to date: 20 Total lbs lost since last in-office visit: 0  Interim History: [name] did some celebration eating and increased eating out, and she has gained weight over the holidays.  Subjective:   1. Vitamin D deficiency [name] is stable on Vit D, and she denies nausea, vomiting, or muscle weakness.  2. Insulin Resistance [name] has questions about insulin resistance, and her family history of diabetes mellitus.  3. Other depression,with emotional eating  [name]'s mood is stable on Wellbutrin and Celexa. She denies signs of serotonin syndrome.  4. At risk for heart disease [name] is at a higher than average risk for cardiovascular disease due to obesity. Reviewed: no chest pain on exertion, no dyspnea on exertion, and no swelling of ankles.  Assessment/Plan:   1. Vitamin D deficiency Low Vitamin D level contributes to fatigue and are associated with obesity, breast, and colon cancer. We will refill prescription Vit D for 1 month. She will follow-up for routine testing of vitamin D, at least 2-3 times per year to avoid over-replacement.  - Vitamin D, Ergocalciferol, (DRISDOL) 1.25 MG (50000 UNIT) CAPS capsule; Take 1 capsule (50,000 Units total) by mouth every 7 (seven) days.  Dispense: 4 capsule; Refill: 0  2. Insulin Resistance [name] will continue diet, exercise, and weight loss, and decreasing simple carbohydrates to  help decrease the risk of diabetes. We will refill metformin for 1 month. [name] agreed to follow-up with [place] as directed to closely monitor her progress.  - metFORMIN (GLUCOPHAGE) 500 MG tablet; Take 1 tablet (500 mg total) by mouth 2 (two) times daily with a meal.  Dispense: 180 tablet; Refill: 0  3. Other depression,with emotional eating  Behavior modification techniques were discussed today to help [name] deal with her emotional/non-hunger eating behaviors. We will refill Wellbutrin and will follow up closely. Orders and follow up as documented in patient record.   - buPROPion (WELLBUTRIN SR) 150 MG 12 hr tablet; Take 1 tablet (150 mg total) by mouth daily.  Dispense: 30 tablet; Refill: 0  4. At risk for heart disease [name] was given approximately 15 minutes of coronary artery disease prevention counseling today. She is 49 y.o. female and has risk factors for heart disease including obesity. We discussed intensive lifestyle modifications today with an emphasis on specific weight loss instructions and strategies.   5. Class 1 obesity with serious comorbidity and body mass index (BMI) of 33.0 to 33.9 in adult, unspecified obesity type [name] is currently in the action stage of change. As such, her goal is to continue with weight loss efforts. She has agreed to keeping a food journal and adhering to recommended goals of 1200-1400 calories and 90+ grams of protein.   We discussed the following exercise goals today: For substantial health benefits, adults should do at least 150 minutes (2 hours and 30 minutes) a week of moderate-intensity, or 75 minutes (1 hour and 15 minutes) a week of vigorous-intensity aerobic physical  activity, or an equivalent combination of moderate- and vigorous-intensity aerobic activity. Aerobic activity should be performed in episodes of at least 10 minutes, and preferably, it should be spread throughout the week. Adults should also include muscle-strengthening  activities that involve all major muscle groups on 2 or more days a week.  We discussed the following behavioral modification strategies today: increasing lean protein intake, decreasing simple carbohydrates, meal planning and cooking strategies, emotional eating strategies and keeping a strict food journal.  [name] has agreed to follow-up with our clinic in 3 to 4 weeks. She was informed of the importance of frequent follow-up visits to maximize her success with intensive lifestyle modifications for her multiple health conditions.   Objective:   Blood pressure 123/81, pulse 75, temperature 98.6 F (37 C), temperature source Oral, height 5\' 8"  (1.727 m), weight 221 lb (100.2 kg), SpO2 98 %. Body mass index is 33.6 kg/m.  General: Cooperative, alert, well developed, in no acute distress. HEENT: Conjunctivae and lids unremarkable. Neck: No thyromegaly.  Cardiovascular: Regular rhythm.  Lungs: Normal work of breathing. Extremities: No edema.  Neurologic: No focal deficits.   Lab Results  Component Value Date   CREATININE 0.67 10/29/2018   BUN 22 10/29/2018   NA 140 10/29/2018   K 4.5 10/29/2018   CL 103 10/29/2018   CO2 21 10/29/2018   Lab Results  Component Value Date   ALT 16 10/29/2018   AST 13 10/29/2018   ALKPHOS 92 10/29/2018   BILITOT 0.3 10/29/2018   Lab Results  Component Value Date   HGBA1C 5.4 02/21/2019   HGBA1C 5.2 10/29/2018   HGBA1C 5.4 05/03/2018   HGBA1C 5.4 01/09/2018   HGBA1C 5.8 (H) 07/04/2017   Lab Results  Component Value Date   INSULIN 17.3 02/21/2019   INSULIN 14.1 10/29/2018   INSULIN 17.8 05/03/2018   INSULIN 15.3 01/09/2018   INSULIN 10.4 07/04/2017   Lab Results  Component Value Date   TSH 1.830 02/21/2019   Lab Results  Component Value Date   CHOL 162 10/29/2018   HDL 62 10/29/2018   LDLCALC 85 10/29/2018   TRIG 76 10/29/2018   CHOLHDL 3 08/16/2016   Lab Results  Component Value Date   WBC 6.1 02/21/2019   HGB 12.5  02/21/2019   HCT 37.3 02/21/2019   MCV 86 02/21/2019   PLT 243 02/21/2019   No results found for: IRON, TIBC, FERRITIN  Attestation Statements:   Reviewed by clinician on day of visit: allergies, medications, problem list, medical history, surgical history, family history, social history, and previous encounter notes.   I, Trixie Dredge, am acting as transcriptionist for Dennard Nip, MD.  I have reviewed the above documentation for accuracy and completeness, and I agree with the above. -  Dennard Nip, MD

## 2019-04-21 ENCOUNTER — Other Ambulatory Visit: Payer: Self-pay | Admitting: Internal Medicine

## 2019-04-22 ENCOUNTER — Other Ambulatory Visit: Payer: Self-pay | Admitting: Internal Medicine

## 2019-04-24 DIAGNOSIS — M7522 Bicipital tendinitis, left shoulder: Secondary | ICD-10-CM | POA: Diagnosis not present

## 2019-04-24 DIAGNOSIS — M25512 Pain in left shoulder: Secondary | ICD-10-CM | POA: Diagnosis not present

## 2019-04-24 DIAGNOSIS — G8929 Other chronic pain: Secondary | ICD-10-CM | POA: Diagnosis not present

## 2019-04-24 DIAGNOSIS — G5602 Carpal tunnel syndrome, left upper limb: Secondary | ICD-10-CM | POA: Diagnosis not present

## 2019-04-30 NOTE — Progress Notes (Signed)
Subjective:    Patient ID: [name], female    DOB: [birth year], 49 y.o.   MRN: [id number]  HPI She is here for a physical exam.   She has not been sleeping for 2-3 months intermittently.  She thinks it is pain in her left shoulder and night sweats.  She goes to bed at the same time nightly. She is seeing ortho for the shoulder and had an injection.  She is having further testing and has a follow up next week.    She is having more panic attacks. They occur once every two weeks.  She feels anxiety, but denies depression.  She is taking the celexa daily, but does not feel like it is working as well.   Medications and allergies reviewed with patient and updated if appropriate.  Patient Active Problem List   Diagnosis Date Noted  . Dry cough 11/21/2017  . Rash and nonspecific skin eruption 11/21/2017  . Anxiety 08/17/2017  . 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 Other insomnia 06/06/2017  . Depression 05/16/2017  . Umbilical hernia 00/45/9977  . Diverticulosis of colon 02/26/2017  . Abdominal pain 02/16/2017  . Other hyperlipidemia 02/06/2017  . Asthma 11/24/2016  . Vitamin D deficiency 11/09/2016  . Class 1 obesity with serious comorbidity and body mass index (BMI) of 33.0 to 33.9 in adult 11/09/2016  . Right shoulder pain 10/11/2016  . AC (acromioclavicular) joint arthritis 09/13/2016  . Postsurgical menopause 08/16/2016  . Mucoid cyst of joint 11/04/2015  . External hemorrhoid 08/20/2015  . Prediabetes 06/05/2015  . GERD (gastroesophageal reflux disease) 06/04/2015  . Status post bilateral breast reduction 04/01/2015  . Cervical disc disorder with radiculopathy of cervical region 05/23/2014  . Ulnar neuropathy 01/16/2014  . Fibromyalgia 01/16/2014  . Recurrent nephrolithiasis 04/29/2013  . Arthralgia 04/29/2013  . Panic attacks 01/15/2013  . IBS (irritable bowel syndrome) 12/14/2011  . B12 deficiency 06/30/2009  . Hypothyroidism 03/02/2007  . Essential hypertension  03/02/2007    Current Outpatient Medications on File Prior to Visit  Medication Sig Dispense Refill  . aspirin 81 MG tablet Take 81 mg by mouth daily.     . blood glucose meter kit and supplies Dispense based on patient and insurance preference. Use up to two times daily as directed. (FOR ICD-10 E10.9, E11.9). 1 each 0  . buPROPion (WELLBUTRIN SR) 150 MG 12 hr tablet Take 1 tablet (150 mg total) by mouth daily. 30 tablet 0  . celecoxib (CELEBREX) 200 MG capsule Take 200 mg by mouth 2 (two) times daily.    . citalopram (CELEXA) 20 MG tablet TAKE 1 TABLET BY MOUTH EVERY DAY 90 tablet 0  . famotidine (PEPCID) 20 MG tablet Take 2 tablets (40 mg total) by mouth daily. Follow-up appt is due must see provider for future refills 60 tablet 0  . Fluticasone-Salmeterol (ADVAIR DISKUS) 250-50 MCG/DOSE AEPB Inhale 1 puff into the lungs 2 (two) times daily. 1 each 3  . glucose blood test strip Test blood sugars once a day 100 each 0  . hydrochlorothiazide (MICROZIDE) 12.5 MG capsule TAKE 1 CAPSULE BY MOUTH EVERY DAY *NEED OFFICE VISIT* 30 capsule 0  . hydroxychloroquine (PLAQUENIL) 200 MG tablet Take 200 mg by mouth 2 (two) times daily.    . Insulin Pen Needle (BD PEN NEEDLE NANO U/F) 32G X 4 MM MISC 1 each by Other route daily. 100 each 0  . liraglutide (VICTOZA) 18 MG/3ML SOPN Inject 0.2 mLs (1.2 mg total) into the skin every morning.  6 pen 0  . lisinopril (ZESTRIL) 40 MG tablet TAKE 1 TABLET BY MOUTH EVERY DAY 90 tablet 0  . metFORMIN (GLUCOPHAGE) 500 MG tablet Take 1 tablet (500 mg total) by mouth 2 (two) times daily with a meal. 180 tablet 0  . montelukast (SINGULAIR) 10 MG tablet TAKE 1 TABLET BY MOUTH EVERY DAY AT BEDTIME *NEED OFFICE VISIT* 30 tablet 1  . OneTouch Delica Lancets 30Q MISC 1 each by Does not apply route 2 (two) times daily. 100 each 0  . PROAIR HFA 108 (90 Base) MCG/ACT inhaler TAKE 2 PUFFS BY MOUTH EVERY 6 HOURS AS NEEDED FOR WHEEZE OR SHORTNESS OF BREATH 8.5 Inhaler 5  . Vitamin D,  Ergocalciferol, (DRISDOL) 1.25 MG (50000 UNIT) CAPS capsule Take 1 capsule (50,000 Units total) by mouth every 7 (seven) days. 4 capsule 0   No current facility-administered medications on file prior to visit.    Past Medical History:  Diagnosis Date  . Anemia   . Anxiety   . Arthritis    inflammitory  . Asthma   . B12 deficiency   . Back pain   . Chronic joint pain   . Constipation   . DDD (degenerative disc disease), lumbar    low back and neck and shoulders  . Depression    during divorce & legal matters  . Fatty liver   . Fibromyalgia   . Gallbladder problem   . GERD (gastroesophageal reflux disease)   . History of kidney stones    Dr Phebe Colla  . History of polycystic ovarian disease S/P BSO  . Hx of anxiety disorder   . Hypertension    a  . Hypothyroidism    no meds  . IBS (irritable bowel syndrome)   . IBS (irritable bowel syndrome)   . Infertility, female   . Joint pain   . Kidney problem   . Lactose intolerance   . Lupus (HCC) hx positive ANA   followed by Dr Gavin Pound; possible lupus  . Multiple food allergies   . Myalgia   . Osteoarthritis   . 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 POLYCYSTIC OVARIAN DISEASE 03/02/2007   Qualifier: Diagnosis of  By: Linna Darner MD, Rae Mar BSO for cysts & endometriosis; Dr Reino Kent, Gyn Seeing Dr Paula Compton    . PONV (postoperative nausea and vomiting)   . Prediabetes   . Sweating profusely   . Symptomatic mammary hypertrophy   . URI (upper respiratory infection)    currently on cefdinir    Past Surgical History:  Procedure Laterality Date  . [place]   exploratory lap  . BREAST REDUCTION SURGERY Bilateral 03/26/2015   Procedure: BILATERAL BREAST REDUCTION ;  Surgeon: Wallace Going, DO;  Location: [place];  Service: Plastics;  Laterality: Bilateral;  . CARPAL TUNNEL RELEASE     bilateral; Dr Sherwood Gambler  . COLONOSCOPY     in 1990s  . CYSTO/  RIGHT RETROGRADE URETERAL PYELOGRAM  07-09-2004   HX BILATERAL RENAL STONES/ RIGHT FLANK PAIN  . CYSTOSCOPY W/ URETERAL STENT PLACEMENT  11/12/2011   Procedure: CYSTOSCOPY WITH RETROGRADE PYELOGRAM/URETERAL STENT PLACEMENT;  Surgeon: Alexis Frock, MD;  Location: [facility];  Service: Urology;  Laterality: Right;  . CYSTOSCOPY W/ URETERAL STENT REMOVAL  12/07/2011   Procedure: CYSTOSCOPY WITH STENT REMOVAL;  Surgeon: Alexis Frock, MD;  Location: [facility];  Service: Urology;  Laterality: Right;  .  CYSTOSCOPY/RETROGRADE/URETEROSCOPY/STONE EXTRACTION WITH BASKET  12/07/2011   Procedure: CYSTOSCOPY/RETROGRADE/URETEROSCOPY/STONE EXTRACTION WITH BASKET;  Surgeon: Alexis Frock, MD;  Location: [facility];  Service: Urology;  Laterality: Right;  . DILATION AND CURETTAGE OF UTERUS     X5  . EXCISION LEFT BARTHOLIN GLAND  02-05-2002  . EYE SURGERY     Retinal tears surgery bilateral  . LAPAROSCOPIC ASSISTED VAGINAL HYSTERECTOMY  2000  . LAPAROSCOPIC CHOLECYSTECTOMY  05-31-2007  . LAPAROSCOPIC LASER ABLATION ENDOMETRIOSIS AND LEFT SALPINGO-OOPHORECTOMY  11-03-1999  . LAPAROSCOPY WITH RIGHT SALPINGO-OOPHECTOMY/ LYSIS ADHESIONS AND ABLATION ENDOMETRIOSIS  05-17-2001  . LIPOSUCTION Bilateral 03/26/2015   Procedure: WITH LIPOSUCTION;  Surgeon: Wallace Going, DO;  Location: [place];  Service: Plastics;  Laterality: Bilateral;  . LUMBAR LAMINECTOMY  2003   L4 - L5  . PLANTAR FASCIA SURGERY     right  . PLANTAR FASCIA SURGERY  02/2011   left  . RIGHT URETEROSCOPIC STONE EXTRACTION  08-04-2000  . TONSILLECTOMY    . URETERAL STENT PLACEMENT  11/12/11   Dr Tresa Moore    Social History   Socioeconomic History  . Marital status: Married    Spouse name: Darnelle Maffucci  . Number of children: 0  . Years of education: college  . Highest education level: Not on file  Occupational History  . Occupation: disabled  Tobacco Use  . Smoking status: Never Smoker  .  Smokeless tobacco: Never Used  Substance and Sexual Activity  . Alcohol use: Yes    Alcohol/week: 1.0 standard drinks    Types: 1 Shots of liquor per week    Comment:  very rarely  . Drug use: No  . Sexual activity: Yes    Birth control/protection: Surgical  Other Topics Concern  . Not on file  Social History Narrative   Patient Lives at home with her husband Darnelle Maffucci)   Disabled.   Education two years of college.   Right handed.   Caffeine coffee and sweet tea. Not daily.   Social Determinants of Health   Financial Resource Strain:   . Difficulty of Paying Living Expenses: Not on file  Food Insecurity:   . Worried About Charity fundraiser in the Last Year: Not on file  . Ran Out of Food in the Last Year: Not on file  Transportation Needs:   . Lack of Transportation (Medical): Not on file  . Lack of Transportation (Non-Medical): Not on file  Physical Activity:   . Days of Exercise per Week: Not on file  . Minutes of Exercise per Session: Not on file  Stress:   . Feeling of Stress : Not on file  Social Connections:   . Frequency of Communication with Friends and Family: Not on file  . Frequency of Social Gatherings with Friends and Family: Not on file  . Attends Religious Services: Not on file  . Active Member of Clubs or Organizations: Not on file  . Attends Archivist Meetings: Not on file  . Marital Status: Not on file    Family History  Problem Relation Age of Onset  . Hypertension Mother   . Colon polyps Mother   . Atrial fibrillation Mother        Dr Angelena Form  . Hyperlipidemia Mother   . Heart disease Mother   . Anxiety disorder Mother   . Sleep apnea Mother   . Obesity Mother   . Heart attack Father 59  . Diabetes Father   . Hypertension Father   .  Hyperlipidemia Father   . Heart disease Father   . Stroke Father   . Obesity Father   . Hypertension Sister   . Heart attack Paternal Grandmother 72  . Breast cancer Paternal Aunt   . Colon  cancer Maternal Uncle   . Colon cancer Paternal Uncle   . Colon polyps Maternal Grandmother   . Stroke Maternal Grandmother 76  . Diabetes Paternal Grandfather   . Stroke Maternal Grandfather 37    Review of Systems  Constitutional: Positive for diaphoresis and fatigue. Negative for chills and fever.  Eyes: Negative for visual disturbance.  Respiratory: Negative for cough, shortness of breath and wheezing.   Cardiovascular: Positive for palpitations (with panic attacks). Negative for chest pain and leg swelling.  Gastrointestinal: Positive for constipation (rarely) and diarrhea (IBS related). Negative for abdominal pain, blood in stool and nausea.       GERD  Genitourinary: Negative for dysuria and hematuria.  Musculoskeletal: Positive for arthralgias and back pain.  Skin: Negative for color change and rash.  Neurological: Negative for dizziness, light-headedness and headaches.  Psychiatric/Behavioral: Negative for dysphoric mood. The patient is nervous/anxious (panic attacks - maybe every couple of weeks).        Objective:   Vitals:   05/01/19 1051  BP: 132/80  Pulse: 78  Resp: 16  Temp: 99.2 F (37.3 C)  SpO2: 99%   Filed Weights   05/01/19 1051  Weight: 230 lb (104.3 kg)   Body mass index is 34.97 kg/m.  BP Readings from Last 3 Encounters:  05/01/19 132/80  04/15/19 123/81  03/18/19 133/84    Wt Readings from Last 3 Encounters:  05/01/19 230 lb (104.3 kg)  04/15/19 221 lb (100.2 kg)  03/18/19 218 lb (98.9 kg)     Physical Exam Constitutional: [name]. No distress.  HENT:  Head: Normocephalic and atraumatic.  Right Ear: External ear normal. Normal ear canal and TM Left Ear: External ear normal.  Normal ear canal and TM Mouth/Throat: Oropharynx is clear and moist.  Eyes: Conjunctivae and EOM are normal.  Neck: Neck supple. No tracheal deviation present. No thyromegaly present.  No carotid bruit  Cardiovascular:  Normal rate, regular rhythm and normal heart sounds.   No murmur heard.  No edema. Pulmonary/Chest: Effort normal and breath sounds normal. No respiratory distress. She has no wheezes. She has no rales.  Breast: deferred   Abdominal: Soft. She exhibits no distension. There is no tenderness.  Lymphadenopathy: She has no cervical adenopathy.  Skin: Skin is warm and dry. She is not diaphoretic.  Psychiatric: She has a normal mood and affect. Her behavior is normal.        Assessment & Plan:   Physical exam: Screening blood work    deferred Immunizations  Up to date  Mammogram  Up to date - schedule Gyn    Up to date - goes Q 3 years Eye exams  Up to date  Exercise   None, encouraged regular exercise Weight  Working on weight loss Substance abuse   none  See Problem List for Assessment and Plan of chronic medical problems.     This visit occurred during the SARS-CoV-2 public health emergency.  Safety protocols were in place, including screening questions prior to the visit, additional usage of staff PPE, and extensive cleaning of exam room while observing appropriate contact time as indicated for disinfecting solutions.

## 2019-04-30 NOTE — Patient Instructions (Addendum)
All other Health Maintenance issues reviewed.   All recommended immunizations and age-appropriate screenings are up-to-date or discussed.  No immunization administered today.   Medications reviewed and updated.  Changes include :     Start omeprazole 20 mg in morning and take the pepcid 20 mg at bedtime.  Stop celexa and start effexor 37.5 mg daily.   Your prescription(s) have been submitted to your pharmacy. Please take as directed and contact our office if you believe you are having problem(s) with the medication(s).  A referral was ordered for Dr Earlean Shawl.   Please followup in 6 months    Health Maintenance, Female Adopting a healthy lifestyle and getting preventive care are important in promoting health and wellness. Ask your health care provider about:  The right schedule for you to have regular tests and exams.  Things you can do on your own to prevent diseases and keep yourself healthy. What should I know about diet, weight, and exercise? Eat a healthy diet   Eat a diet that includes plenty of vegetables, fruits, low-fat dairy products, and lean protein.  Do not eat a lot of foods that are high in solid fats, added sugars, or sodium. Maintain a healthy weight Body mass index (BMI) is used to identify weight problems. It estimates body fat based on height and weight. Your health care provider can help determine your BMI and help you achieve or maintain a healthy weight. Get regular exercise Get regular exercise. This is one of the most important things you can do for your health. Most adults should:  Exercise for at least 150 minutes each week. The exercise should increase your heart rate and make you sweat (moderate-intensity exercise).  Do strengthening exercises at least twice a week. This is in addition to the moderate-intensity exercise.  Spend less time sitting. Even light physical activity can be beneficial. Watch cholesterol and blood lipids Have your blood  tested for lipids and cholesterol at 50 years of age, then have this test every 5 years. Have your cholesterol levels checked more often if:  Your lipid or cholesterol levels are high.  You are older than 50 years of age.  You are at high risk for heart disease. What should I know about cancer screening? Depending on your health history and family history, you may need to have cancer screening at various ages. This may include screening for:  Breast cancer.  Cervical cancer.  Colorectal cancer.  Skin cancer.  Lung cancer. What should I know about heart disease, diabetes, and high blood pressure? Blood pressure and heart disease  High blood pressure causes heart disease and increases the risk of stroke. This is more likely to develop in people who have high blood pressure readings, are of African descent, or are overweight.  Have your blood pressure checked: ? Every 3-5 years if you are 20-30 years of age. ? Every year if you are 56 years old or older. Diabetes Have regular diabetes screenings. This checks your fasting blood sugar level. Have the screening done:  Once every three years after age 39 if you are at a normal weight and have a low risk for diabetes.  More often and at a younger age if you are overweight or have a high risk for diabetes. What should I know about preventing infection? Hepatitis B If you have a higher risk for hepatitis B, you should be screened for this virus. Talk with your health care provider to find out if you are at risk  for hepatitis B infection. Hepatitis C Testing is recommended for:  Everyone born from [birth year] through 1966.  Anyone with known risk factors for hepatitis C. Sexually transmitted infections (STIs)  Get screened for STIs, including gonorrhea and chlamydia, if: ? You are sexually active and are younger than 50 years of age. ? You are older than 50 years of age and your health care provider tells you that you are at risk for  this type of infection. ? Your sexual activity has changed since you were last screened, and you are at increased risk for chlamydia or gonorrhea. Ask your health care provider if you are at risk.  Ask your health care provider about whether you are at high risk for HIV. Your health care provider may recommend a prescription medicine to help prevent HIV infection. If you choose to take medicine to prevent HIV, you should first get tested for HIV. You should then be tested every 3 months for as long as you are taking the medicine. Pregnancy  If you are about to stop having your period (premenopausal) and you may become pregnant, seek counseling before you get pregnant.  Take 400 to 800 micrograms (mcg) of folic acid every day if you become pregnant.  Ask for birth control (contraception) if you want to prevent pregnancy. Osteoporosis and menopause Osteoporosis is a disease in which the bones lose minerals and strength with aging. This can result in bone fractures. If you are 66 years old or older, or if you are at risk for osteoporosis and fractures, ask your health care provider if you should:  Be screened for bone loss.  Take a calcium or vitamin D supplement to lower your risk of fractures.  Be given hormone replacement therapy (HRT) to treat symptoms of menopause. Follow these instructions at home: Lifestyle  Do not use any products that contain nicotine or tobacco, such as cigarettes, e-cigarettes, and chewing tobacco. If you need help quitting, ask your health care provider.  Do not use street drugs.  Do not share needles.  Ask your health care provider for help if you need support or information about quitting drugs. Alcohol use  Do not drink alcohol if: ? Your health care provider tells you not to drink. ? You are pregnant, may be pregnant, or are planning to become pregnant.  If you drink alcohol: ? Limit how much you use to 0-1 drink a day. ? Limit intake if you are  breastfeeding.  Be aware of how much alcohol is in your drink. In the [place] one drink equals one 12 oz bottle of beer (355 mL), one 5 oz glass of wine (148 mL), or one 1 oz glass of hard liquor (44 mL). General instructions  Schedule regular health, dental, and eye exams.  Stay current with your vaccines.  Tell your health care provider if: ? You often feel depressed. ? You have ever been abused or do not feel safe at home. Summary  Adopting a healthy lifestyle and getting preventive care are important in promoting health and wellness.  Follow your health care provider's instructions about healthy diet, exercising, and getting tested or screened for diseases.  Follow your health care provider's instructions on monitoring your cholesterol and blood pressure. This information is not intended to replace advice given to you by your health care provider. Make sure you discuss any questions you have with your health care provider. Document Revised: 03/14/2018 Document Reviewed: 03/14/2018 Elsevier Patient Education  2020 [employer].

## 2019-05-01 ENCOUNTER — Encounter: Payer: Self-pay | Admitting: Internal Medicine

## 2019-05-01 ENCOUNTER — Other Ambulatory Visit: Payer: Self-pay

## 2019-05-01 ENCOUNTER — Ambulatory Visit (INDEPENDENT_AMBULATORY_CARE_PROVIDER_SITE_OTHER): Payer: PPO | Admitting: Internal Medicine

## 2019-05-01 VITALS — BP 132/80 | HR 78 | Temp 99.2°F | Resp 16 | Ht 68.0 in | Wt 230.0 lb

## 2019-05-01 DIAGNOSIS — Z1211 Encounter for screening for malignant neoplasm of colon: Secondary | ICD-10-CM

## 2019-05-01 DIAGNOSIS — F3289 Other specified depressive episodes: Secondary | ICD-10-CM

## 2019-05-01 DIAGNOSIS — R7303 Prediabetes: Secondary | ICD-10-CM

## 2019-05-01 DIAGNOSIS — K649 Unspecified hemorrhoids: Secondary | ICD-10-CM

## 2019-05-01 DIAGNOSIS — F410 Panic disorder [episodic paroxysmal anxiety] without agoraphobia: Secondary | ICD-10-CM

## 2019-05-01 DIAGNOSIS — S46012A Strain of muscle(s) and tendon(s) of the rotator cuff of left shoulder, initial encounter: Secondary | ICD-10-CM | POA: Diagnosis not present

## 2019-05-01 DIAGNOSIS — G4709 Other insomnia: Secondary | ICD-10-CM

## 2019-05-01 DIAGNOSIS — K219 Gastro-esophageal reflux disease without esophagitis: Secondary | ICD-10-CM

## 2019-05-01 DIAGNOSIS — I10 Essential (primary) hypertension: Secondary | ICD-10-CM

## 2019-05-01 DIAGNOSIS — Z0000 Encounter for general adult medical examination without abnormal findings: Secondary | ICD-10-CM

## 2019-05-01 DIAGNOSIS — F419 Anxiety disorder, unspecified: Secondary | ICD-10-CM | POA: Diagnosis not present

## 2019-05-01 DIAGNOSIS — M25512 Pain in left shoulder: Secondary | ICD-10-CM | POA: Diagnosis not present

## 2019-05-01 DIAGNOSIS — J4540 Moderate persistent asthma, uncomplicated: Secondary | ICD-10-CM | POA: Diagnosis not present

## 2019-05-01 MED ORDER — LISINOPRIL 40 MG PO TABS: 40.0000 mg | ORAL_TABLET | Freq: Every day | ORAL | 1 refills | Status: DC

## 2019-05-01 MED ORDER — OMEPRAZOLE 20 MG PO CPDR: 20.0000 mg | DELAYED_RELEASE_CAPSULE | Freq: Every day | ORAL | 1 refills | Status: DC

## 2019-05-01 MED ORDER — VENLAFAXINE HCL ER 37.5 MG PO CP24: 37.5000 mg | ORAL_CAPSULE | Freq: Every day | ORAL | 5 refills | Status: DC

## 2019-05-01 MED ORDER — HYDROCHLOROTHIAZIDE 12.5 MG PO CAPS: ORAL_CAPSULE | ORAL | 1 refills | Status: DC

## 2019-05-01 MED ORDER — FAMOTIDINE 20 MG PO TABS: 20.0000 mg | ORAL_TABLET | Freq: Every day | ORAL | 1 refills | Status: DC

## 2019-05-01 NOTE — Assessment & Plan Note (Signed)
Last A1c well controlled Currently on medications for insulin resistance and weight loss Encourage regular exercise and revising diet Currently working on weight loss

## 2019-05-01 NOTE — Assessment & Plan Note (Signed)
Chronic BP well controlled Current regimen effective and well tolerated Continue current medications at current doses cmp  

## 2019-05-01 NOTE — Assessment & Plan Note (Signed)
She is having difficulty sleeping, which started 2-3 months ago  Related to hot flashes secondary to menopause and left shoulder pain There may be some underlying anxiety also affecting her sleep We will be starting Effexor, which may help some with anxiety Currently seeing orthopedics for shoulder Discussed options for hot flashes

## 2019-05-01 NOTE — Assessment & Plan Note (Signed)
Denies any current depression She is on Wellbutrin, which may be helping Will be starting Effexor for anxiety/panic attacks

## 2019-05-01 NOTE — Assessment & Plan Note (Signed)
Chronic Not well controlled, having increased panic attacks Start Effexor 37.5 mg daily Stop Celexa

## 2019-05-01 NOTE — Assessment & Plan Note (Addendum)
Chronic Some generalized anxiety with panic attacks Not controlled Stop celexa-no longer effective Start effexor 37.5 mg daily

## 2019-05-06 DIAGNOSIS — G5602 Carpal tunnel syndrome, left upper limb: Secondary | ICD-10-CM | POA: Diagnosis not present

## 2019-05-07 DIAGNOSIS — G5602 Carpal tunnel syndrome, left upper limb: Secondary | ICD-10-CM | POA: Diagnosis not present

## 2019-05-07 DIAGNOSIS — Z6834 Body mass index (BMI) 34.0-34.9, adult: Secondary | ICD-10-CM | POA: Diagnosis not present

## 2019-05-07 DIAGNOSIS — M25512 Pain in left shoulder: Secondary | ICD-10-CM | POA: Diagnosis not present

## 2019-05-08 ENCOUNTER — Other Ambulatory Visit (INDEPENDENT_AMBULATORY_CARE_PROVIDER_SITE_OTHER): Payer: Self-pay | Admitting: Family Medicine

## 2019-05-08 ENCOUNTER — Other Ambulatory Visit: Payer: Self-pay | Admitting: Internal Medicine

## 2019-05-08 DIAGNOSIS — F3289 Other specified depressive episodes: Secondary | ICD-10-CM

## 2019-05-13 ENCOUNTER — Ambulatory Visit (INDEPENDENT_AMBULATORY_CARE_PROVIDER_SITE_OTHER): Payer: PPO | Admitting: Family Medicine

## 2019-05-13 ENCOUNTER — Other Ambulatory Visit: Payer: Self-pay

## 2019-05-13 ENCOUNTER — Encounter (INDEPENDENT_AMBULATORY_CARE_PROVIDER_SITE_OTHER): Payer: Self-pay | Admitting: Family Medicine

## 2019-05-13 ENCOUNTER — Other Ambulatory Visit (INDEPENDENT_AMBULATORY_CARE_PROVIDER_SITE_OTHER): Payer: Self-pay | Admitting: Family Medicine

## 2019-05-13 VITALS — BP 115/73 | HR 79 | Temp 98.4°F | Ht 68.0 in | Wt 224.0 lb

## 2019-05-13 DIAGNOSIS — E8881 Metabolic syndrome: Secondary | ICD-10-CM

## 2019-05-13 DIAGNOSIS — Z6834 Body mass index (BMI) 34.0-34.9, adult: Secondary | ICD-10-CM

## 2019-05-13 DIAGNOSIS — M25512 Pain in left shoulder: Secondary | ICD-10-CM | POA: Diagnosis not present

## 2019-05-13 DIAGNOSIS — E669 Obesity, unspecified: Secondary | ICD-10-CM

## 2019-05-13 MED ORDER — METFORMIN HCL 500 MG PO TABS: 500.0000 mg | ORAL_TABLET | Freq: Two times a day (BID) | ORAL | 0 refills | Status: DC

## 2019-05-13 MED ORDER — LIRAGLUTIDE 18 MG/3ML ~~LOC~~ SOPN: 1.2000 mg | PEN_INJECTOR | Freq: Every morning | SUBCUTANEOUS | 0 refills | Status: DC

## 2019-05-13 NOTE — Progress Notes (Signed)
Chief Complaint:   OBESITY [name] is here to discuss her progress with her obesity treatment plan along with follow-up of her obesity related diagnoses. [name] is on keeping a food journal and adhering to recommended goals of 1200-1400 calories and 90+ grams of protein daily and states she is following her eating plan approximately 65% of the time. [name] states she is doing 0 minutes 0 times per week.  Today's visit was #: 37 Starting weight: 241 lbs Starting date: 09/28/16 Today's weight: 224 lbs Today's date: 05/13/2019 Total lbs lost to date: 17 Total lbs lost since last in-office visit: 0  Interim History: [name] is struggling to keep her calories at goal. She sometimes eats approximately 1800 calories daily. She states her cravings is more often an issue than hunger. She is not concentrating on her protein goals.  Subjective:   1. Insulin resistance [name] is stable on metformin and Victoza. She denies nausea, vomiting, or hypoglycemia. She requests a refill today.  Assessment/Plan:   1. Insulin resistance [name] will continue to work on weight loss, exercise, and decreasing simple carbohydrates to help decrease the risk of diabetes. We will refill metformin and Victoza for 1 month. [name] agreed to follow-up with [place] as directed to closely monitor her progress.  - metFORMIN (GLUCOPHAGE) 500 MG tablet; Take 1 tablet (500 mg total) by mouth 2 (two) times daily with a meal.  Dispense: 60 tablet; Refill: 0 - liraglutide (VICTOZA) 18 MG/3ML SOPN; Inject 0.2 mLs (1.2 mg total) into the skin every morning.  Dispense: 2 pen; Refill: 0  2. Class 1 obesity with serious comorbidity and body mass index (BMI) of 34.0 to 34.9 in adult, unspecified obesity type [name] is currently in the action stage of change. As such, her goal is to continue with weight loss efforts. She has agreed to keeping a food journal and adhering to recommended goals of 1200-1400 calories and 90+  grams of protein daily. High protein breakfast was discussed.   Behavioral modification strategies: increasing lean protein intake.  [name] has agreed to follow-up with our clinic in 4 weeks. She was informed of the importance of frequent follow-up visits to maximize her success with intensive lifestyle modifications for her multiple health conditions.   Objective:   Blood pressure 115/73, pulse 79, temperature 98.4 F (36.9 C), temperature source Oral, height 5\' 8"  (1.727 m), weight 224 lb (101.6 kg), SpO2 99 %. Body mass index is 34.06 kg/m.  General: Cooperative, alert, well developed, in no acute distress. HEENT: Conjunctivae and lids unremarkable. Cardiovascular: Regular rhythm.  Lungs: Normal work of breathing. Neurologic: No focal deficits.   Lab Results  Component Value Date   CREATININE 0.67 10/29/2018   BUN 22 10/29/2018   NA 140 10/29/2018   K 4.5 10/29/2018   CL 103 10/29/2018   CO2 21 10/29/2018   Lab Results  Component Value Date   ALT 16 10/29/2018   AST 13 10/29/2018   ALKPHOS 92 10/29/2018   BILITOT 0.3 10/29/2018   Lab Results  Component Value Date   HGBA1C 5.4 02/21/2019   HGBA1C 5.2 10/29/2018   HGBA1C 5.4 05/03/2018   HGBA1C 5.4 01/09/2018   HGBA1C 5.8 (H) 07/04/2017   Lab Results  Component Value Date   INSULIN 17.3 02/21/2019   INSULIN 14.1 10/29/2018   INSULIN 17.8 05/03/2018   INSULIN 15.3 01/09/2018   INSULIN 10.4 07/04/2017   Lab Results  Component Value Date   TSH 1.830 02/21/2019   Lab Results  Component Value Date   CHOL 162 10/29/2018   HDL 62 10/29/2018   LDLCALC 85 10/29/2018   TRIG 76 10/29/2018   CHOLHDL 3 08/16/2016   Lab Results  Component Value Date   WBC 6.1 02/21/2019   HGB 12.5 02/21/2019   HCT 37.3 02/21/2019   MCV 86 02/21/2019   PLT 243 02/21/2019   No results found for: IRON, TIBC, FERRITIN  Obesity Behavioral Intervention Documentation for Insurance:   Approximately 15 minutes were spent on the  discussion below.  ASK: We discussed the diagnosis of obesity with [name] today and [name] agreed to give [place] permission to discuss obesity behavioral modification therapy today.  ASSESS: [name] has the diagnosis of obesity and her BMI today is 34.07. [name] is in the action stage of change.   ADVISE: [name] was educated on the multiple health risks of obesity as well as the benefit of weight loss to improve her health. She was advised of the need for long term treatment and the importance of lifestyle modifications to improve her current health and to decrease her risk of future health problems.  AGREE: Multiple dietary modification options and treatment options were discussed and [name] agreed to follow the recommendations documented in the above note.  ARRANGE: [name] was educated on the importance of frequent visits to treat obesity as outlined per CMS and USPSTF guidelines and agreed to schedule her next follow up appointment today.  Attestation Statements:   Reviewed by clinician on day of visit: allergies, medications, problem list, medical history, surgical history, family history, social history, and previous encounter notes.   I, Trixie Dredge, am acting as transcriptionist for Dennard Nip, MD.  I have reviewed the above documentation for accuracy and completeness, and I agree with the above. -  Dennard Nip, MD

## 2019-05-14 ENCOUNTER — Encounter: Payer: Self-pay | Admitting: Internal Medicine

## 2019-05-15 MED ORDER — CITALOPRAM HYDROBROMIDE 20 MG PO TABS: 20.0000 mg | ORAL_TABLET | Freq: Every day | ORAL | 1 refills | Status: DC

## 2019-05-15 NOTE — Telephone Encounter (Signed)
Pt sent a my chart message about wanting to go back on celexa. You can reply through my chart. I just already replied with the answer to the first part of her message. Please advise.

## 2019-05-16 ENCOUNTER — Other Ambulatory Visit: Payer: Self-pay | Admitting: Internal Medicine

## 2019-05-27 ENCOUNTER — Other Ambulatory Visit: Payer: Self-pay

## 2019-05-27 ENCOUNTER — Other Ambulatory Visit (INDEPENDENT_AMBULATORY_CARE_PROVIDER_SITE_OTHER): Payer: Self-pay | Admitting: Family Medicine

## 2019-05-27 ENCOUNTER — Ambulatory Visit
Admission: RE | Admit: 2019-05-27 | Discharge: 2019-05-27 | Disposition: A | Discharge status: Home / Self Care | Payer: PPO | Source: Ambulatory Visit | Attending: Obstetrics and Gynecology | Admitting: Obstetrics and Gynecology

## 2019-05-27 DIAGNOSIS — Z1231 Encounter for screening mammogram for malignant neoplasm of breast: Secondary | ICD-10-CM | POA: Diagnosis not present

## 2019-05-27 DIAGNOSIS — E8881 Metabolic syndrome: Secondary | ICD-10-CM

## 2019-05-27 DIAGNOSIS — Z79899 Other long term (current) drug therapy: Secondary | ICD-10-CM | POA: Diagnosis not present

## 2019-06-10 ENCOUNTER — Ambulatory Visit (INDEPENDENT_AMBULATORY_CARE_PROVIDER_SITE_OTHER): Payer: PPO | Admitting: Family Medicine

## 2019-06-10 ENCOUNTER — Other Ambulatory Visit (INDEPENDENT_AMBULATORY_CARE_PROVIDER_SITE_OTHER): Payer: Self-pay | Admitting: Family Medicine

## 2019-06-10 DIAGNOSIS — F3289 Other specified depressive episodes: Secondary | ICD-10-CM

## 2019-06-18 ENCOUNTER — Encounter: Payer: Self-pay | Admitting: Internal Medicine

## 2019-06-18 ENCOUNTER — Other Ambulatory Visit: Payer: Self-pay | Admitting: Internal Medicine

## 2019-06-26 DIAGNOSIS — K529 Noninfective gastroenteritis and colitis, unspecified: Secondary | ICD-10-CM | POA: Diagnosis not present

## 2019-06-26 DIAGNOSIS — K648 Other hemorrhoids: Secondary | ICD-10-CM | POA: Diagnosis not present

## 2019-06-26 DIAGNOSIS — R197 Diarrhea, unspecified: Secondary | ICD-10-CM | POA: Diagnosis not present

## 2019-07-08 ENCOUNTER — Ambulatory Visit (INDEPENDENT_AMBULATORY_CARE_PROVIDER_SITE_OTHER): Payer: PPO | Admitting: Family Medicine

## 2019-07-08 ENCOUNTER — Other Ambulatory Visit: Payer: Self-pay

## 2019-07-08 ENCOUNTER — Encounter (INDEPENDENT_AMBULATORY_CARE_PROVIDER_SITE_OTHER): Payer: Self-pay | Admitting: Family Medicine

## 2019-07-08 VITALS — BP 130/81 | HR 81 | Temp 98.8°F | Ht 68.0 in | Wt 230.0 lb

## 2019-07-08 DIAGNOSIS — F3289 Other specified depressive episodes: Secondary | ICD-10-CM | POA: Diagnosis not present

## 2019-07-08 DIAGNOSIS — E559 Vitamin D deficiency, unspecified: Secondary | ICD-10-CM

## 2019-07-08 DIAGNOSIS — E8881 Metabolic syndrome: Secondary | ICD-10-CM

## 2019-07-08 DIAGNOSIS — Z6835 Body mass index (BMI) 35.0-35.9, adult: Secondary | ICD-10-CM

## 2019-07-08 DIAGNOSIS — Z9189 Other specified personal risk factors, not elsewhere classified: Secondary | ICD-10-CM

## 2019-07-08 MED ORDER — BUPROPION HCL ER (SR) 150 MG PO TB12: 150.0000 mg | ORAL_TABLET | Freq: Every day | ORAL | 0 refills | Status: DC

## 2019-07-08 MED ORDER — VITAMIN D (ERGOCALCIFEROL) 1.25 MG (50000 UNIT) PO CAPS: 50000.0000 [IU] | ORAL_CAPSULE | ORAL | 0 refills | Status: DC

## 2019-07-08 MED ORDER — METFORMIN HCL 500 MG PO TABS: 500.0000 mg | ORAL_TABLET | Freq: Two times a day (BID) | ORAL | 0 refills | Status: DC

## 2019-07-08 NOTE — Progress Notes (Signed)
Chief Complaint:   OBESITY [name] is here to discuss her progress with her obesity treatment plan along with follow-up of her obesity related diagnoses. [name] is on keeping a food journal and adhering to recommended goals of 1200-1400 calories and 90+ grams of protein daily and states she is following her eating plan approximately 40% of the time. [name] states she is active while doing yardwork.  Today's visit was #: 76 Starting weight: 241 lbs Starting date: 09/28/2016 Today's weight: 230 lbs Today's date: 07/08/2019 Total lbs lost to date: 11 Total lbs lost since last in-office visit: 0  Interim History: [name] has increased eating out and has gained weight since her last visit. She states her meal planning has decreased, but she is ready to get back on track.  Subjective:   1. Insulin resistance [name]'s last A1c was at 5.4, but she has increased simple carbohydrates and eating out. She is tolerating metformin well.  2. Vitamin D deficiency [name] is due for labs, and she notes fatigue.  3. Other depression, with emotional eating [name] is stable on Wellbutrin, and her blood pressure is not elevated. She requests a refill today.  4. At risk for dehydration [name] is at risk for dehydration due to elevated BUN.  Assessment/Plan:   1. Insulin resistance [name] will continue to work on weight loss, exercise, and decreasing simple carbohydrates to help decrease the risk of diabetes. We will refill metformin for 1 month, and we will check labs today. [name] agreed to follow-up with [place] as directed to closely monitor her progress.  - Hemoglobin A1c  - metFORMIN (GLUCOPHAGE) 500 MG tablet; Take 1 tablet (500 mg total) by mouth 2 (two) times daily with a meal.  Dispense: 60 tablet; Refill: 0  2. Vitamin D deficiency Low Vitamin D level contributes to fatigue and are associated with obesity, breast, and colon cancer. We will refill prescription Vitamin D for  1 month. [name] will follow-up for routine testing of Vitamin D, at least 2-3 times per year to avoid over-replacement. We will check labs today.  - VITAMIN D 25 Hydroxy (Vit-D Deficiency, Fractures)  - Vitamin D, Ergocalciferol, (DRISDOL) 1.25 MG (50000 UNIT) CAPS capsule; Take 1 capsule (50,000 Units total) by mouth every 7 (seven) days.  Dispense: 4 capsule; Refill: 0  3. Other depression, with emotional eating Behavior modification techniques were discussed today to help [name] deal with her emotional/non-hunger eating behaviors. We will refill Wellbutrin SR for 1 month. Orders and follow up as documented in patient record.   - buPROPion (WELLBUTRIN SR) 150 MG 12 hr tablet; Take 1 tablet (150 mg total) by mouth daily.  Dispense: 30 tablet; Refill: 0  4. At risk for dehydration [name] was given approximately 15 minutes dehydration prevention counseling today. [name] is at risk for dehydration due to elevated BUN. She was encouraged to increase water as tolerated and monitor fluid status to avoid dehydration as well as weight loss plateaus.   5. Class 2 severe obesity with serious comorbidity and body mass index (BMI) of 35.0 to 35.9 in adult, unspecified obesity type [facility]) [name] is currently in the action stage of change. As such, her goal is to continue with weight loss efforts. She has agreed to keeping a food journal and adhering to recommended goals of 1200-1400 calories and 90+ grams of protein daily.   Exercise goals: As is.  Behavioral modification strategies: decreasing eating out and meal planning and cooking strategies.  [name] has agreed to follow-up with  our clinic in 3 weeks. She was informed of the importance of frequent follow-up visits to maximize her success with intensive lifestyle modifications for her multiple health conditions.   [name] was informed we would discuss her lab results at her next visit unless there is a critical issue that needs to be  addressed sooner. [name] agreed to keep her next visit at the agreed upon time to discuss these results.  Objective:   Blood pressure 130/81, pulse 81, temperature 98.8 F (37.1 C), temperature source Oral, height 5\' 8"  (1.727 m), weight 230 lb (104.3 kg), SpO2 96 %. Body mass index is 34.97 kg/m.  General: Cooperative, alert, well developed, in no acute distress. HEENT: Conjunctivae and lids unremarkable. Cardiovascular: Regular rhythm.  Lungs: Normal work of breathing. Neurologic: No focal deficits.   Lab Results  Component Value Date   CREATININE 0.67 10/29/2018   BUN 22 10/29/2018   NA 140 10/29/2018   K 4.5 10/29/2018   CL 103 10/29/2018   CO2 21 10/29/2018   Lab Results  Component Value Date   ALT 16 10/29/2018   AST 13 10/29/2018   ALKPHOS 92 10/29/2018   BILITOT 0.3 10/29/2018   Lab Results  Component Value Date   HGBA1C 5.4 02/21/2019   HGBA1C 5.2 10/29/2018   HGBA1C 5.4 05/03/2018   HGBA1C 5.4 01/09/2018   HGBA1C 5.8 (H) 07/04/2017   Lab Results  Component Value Date   INSULIN 17.3 02/21/2019   INSULIN 14.1 10/29/2018   INSULIN 17.8 05/03/2018   INSULIN 15.3 01/09/2018   INSULIN 10.4 07/04/2017   Lab Results  Component Value Date   TSH 1.830 02/21/2019   Lab Results  Component Value Date   CHOL 162 10/29/2018   HDL 62 10/29/2018   LDLCALC 85 10/29/2018   TRIG 76 10/29/2018   CHOLHDL 3 08/16/2016   Lab Results  Component Value Date   WBC 6.1 02/21/2019   HGB 12.5 02/21/2019   HCT 37.3 02/21/2019   MCV 86 02/21/2019   PLT 243 02/21/2019   No results found for: IRON, TIBC, FERRITIN  Attestation Statements:   Reviewed by clinician on day of visit: allergies, medications, problem list, medical history, surgical history, family history, social history, and previous encounter notes.   I, Trixie Dredge, am acting as transcriptionist for Dennard Nip, MD.  I have reviewed the above documentation for accuracy and completeness, and I agree  with the above. -  Dennard Nip, MD

## 2019-07-09 DIAGNOSIS — M064 Inflammatory polyarthropathy: Secondary | ICD-10-CM | POA: Diagnosis not present

## 2019-07-09 DIAGNOSIS — E669 Obesity, unspecified: Secondary | ICD-10-CM | POA: Diagnosis not present

## 2019-07-09 DIAGNOSIS — M159 Polyosteoarthritis, unspecified: Secondary | ICD-10-CM | POA: Diagnosis not present

## 2019-07-09 DIAGNOSIS — M2550 Pain in unspecified joint: Secondary | ICD-10-CM | POA: Diagnosis not present

## 2019-07-09 DIAGNOSIS — Z6835 Body mass index (BMI) 35.0-35.9, adult: Secondary | ICD-10-CM | POA: Diagnosis not present

## 2019-07-09 LAB — VITAMIN D 25 HYDROXY (VIT D DEFICIENCY, FRACTURES): Vit D, 25-Hydroxy: 50.1 ng/mL (ref 30.0–100.0)

## 2019-07-09 LAB — HEMOGLOBIN A1C
Est. average glucose Bld gHb Est-mCnc: 105 mg/dL
Hgb A1c MFr Bld: 5.3 % (ref 4.8–5.6)

## 2019-07-15 DIAGNOSIS — Z8719 Personal history of other diseases of the digestive system: Secondary | ICD-10-CM | POA: Diagnosis not present

## 2019-07-15 DIAGNOSIS — K648 Other hemorrhoids: Secondary | ICD-10-CM | POA: Diagnosis not present

## 2019-07-15 DIAGNOSIS — R197 Diarrhea, unspecified: Secondary | ICD-10-CM | POA: Diagnosis not present

## 2019-07-15 DIAGNOSIS — Z1211 Encounter for screening for malignant neoplasm of colon: Secondary | ICD-10-CM | POA: Diagnosis not present

## 2019-07-15 LAB — HM COLONOSCOPY

## 2019-07-16 DIAGNOSIS — Z23 Encounter for immunization: Secondary | ICD-10-CM | POA: Diagnosis not present

## 2019-07-18 DIAGNOSIS — M961 Postlaminectomy syndrome, not elsewhere classified: Secondary | ICD-10-CM | POA: Diagnosis not present

## 2019-07-18 DIAGNOSIS — M5416 Radiculopathy, lumbar region: Secondary | ICD-10-CM | POA: Diagnosis not present

## 2019-07-23 DIAGNOSIS — M545 Low back pain: Secondary | ICD-10-CM | POA: Diagnosis not present

## 2019-07-23 DIAGNOSIS — M961 Postlaminectomy syndrome, not elsewhere classified: Secondary | ICD-10-CM | POA: Diagnosis not present

## 2019-07-28 ENCOUNTER — Other Ambulatory Visit (INDEPENDENT_AMBULATORY_CARE_PROVIDER_SITE_OTHER): Payer: Self-pay | Admitting: Family Medicine

## 2019-07-28 DIAGNOSIS — E559 Vitamin D deficiency, unspecified: Secondary | ICD-10-CM

## 2019-07-29 DIAGNOSIS — M5136 Other intervertebral disc degeneration, lumbar region: Secondary | ICD-10-CM | POA: Diagnosis not present

## 2019-07-29 DIAGNOSIS — M5416 Radiculopathy, lumbar region: Secondary | ICD-10-CM | POA: Diagnosis not present

## 2019-07-29 DIAGNOSIS — M961 Postlaminectomy syndrome, not elsewhere classified: Secondary | ICD-10-CM | POA: Diagnosis not present

## 2019-07-30 ENCOUNTER — Ambulatory Visit (INDEPENDENT_AMBULATORY_CARE_PROVIDER_SITE_OTHER): Payer: PPO | Admitting: Family Medicine

## 2019-07-30 ENCOUNTER — Encounter (INDEPENDENT_AMBULATORY_CARE_PROVIDER_SITE_OTHER): Payer: Self-pay | Admitting: Family Medicine

## 2019-07-30 ENCOUNTER — Other Ambulatory Visit: Payer: Self-pay

## 2019-07-30 VITALS — BP 137/83 | HR 78 | Temp 99.2°F | Ht 68.0 in | Wt 230.0 lb

## 2019-07-30 DIAGNOSIS — Z6835 Body mass index (BMI) 35.0-35.9, adult: Secondary | ICD-10-CM

## 2019-07-30 DIAGNOSIS — M5431 Sciatica, right side: Secondary | ICD-10-CM

## 2019-07-31 NOTE — Progress Notes (Signed)
Chief Complaint:   OBESITY Iqlas is here to discuss her progress with her obesity treatment plan along with follow-up of her obesity related diagnoses. Eric is on keeping a food journal and adhering to recommended goals of 1200-1400 calories and 90+ grams of protein daily and states she is following her eating plan approximately 50% of the time. Karelyn states she is doing 0 minutes 0 times per week.  Today's visit was #: 13 Starting weight: 241 lbs Starting date: 09/28/2016 Today's weight: 230 lbs Today's date: 07/30/2019 Total lbs lost to date: 11 Total lbs lost since last in-office visit: 0  Interim History: Sahalie has done well maintaining her weight even with increased back and sciatic pain, which is limiting her activity and the pain has decreased.  Subjective:   1. Sciatic pain, right Yanessa notes increased right pain radiating down her right lateral thigh to her knee with medial thigh numbness. She is followed by Neurology and is scheduled to have an injection later this week.  Assessment/Plan:   1. Sciatic pain, right Lurana is to hold exercise until her pain improves, and will follow up and help her increase activity when cleared by her surgeon.  2. Class 2 severe obesity with serious comorbidity and body mass index (BMI) of 35.0 to 35.9 in adult, unspecified obesity type [facility]) Ramya is currently in the action stage of change. As such, her goal is to continue with weight loss efforts. She has agreed to keeping a food journal and adhering to recommended goals of 1200-1400 calories and 90+ grams of protein daily.   Behavioral modification strategies: no skipping meals.  Jc has agreed to follow-up with our clinic in 4 weeks. She was informed of the importance of frequent follow-up visits to maximize her success with intensive lifestyle modifications for her multiple health conditions.   Objective:   Blood pressure 137/83, pulse 78, temperature  99.2 F (37.3 C), temperature source Oral, height 5\' 8"  (1.727 m), weight 230 lb (104.3 kg), SpO2 98 %. Body mass index is 34.97 kg/m.  General: Cooperative, alert, well developed, in no acute distress. HEENT: Conjunctivae and lids unremarkable. Cardiovascular: Regular rhythm.  Lungs: Normal work of breathing. Neurologic: No focal deficits.   Lab Results  Component Value Date   CREATININE 0.67 10/29/2018   BUN 22 10/29/2018   NA 140 10/29/2018   K 4.5 10/29/2018   CL 103 10/29/2018   CO2 21 10/29/2018   Lab Results  Component Value Date   ALT 16 10/29/2018   AST 13 10/29/2018   ALKPHOS 92 10/29/2018   BILITOT 0.3 10/29/2018   Lab Results  Component Value Date   HGBA1C 5.3 07/08/2019   HGBA1C 5.4 02/21/2019   HGBA1C 5.2 10/29/2018   HGBA1C 5.4 05/03/2018   HGBA1C 5.4 01/09/2018   Lab Results  Component Value Date   INSULIN 17.3 02/21/2019   INSULIN 14.1 10/29/2018   INSULIN 17.8 05/03/2018   INSULIN 15.3 01/09/2018   INSULIN 10.4 07/04/2017   Lab Results  Component Value Date   TSH 1.830 02/21/2019   Lab Results  Component Value Date   CHOL 162 10/29/2018   HDL 62 10/29/2018   LDLCALC 85 10/29/2018   TRIG 76 10/29/2018   CHOLHDL 3 08/16/2016   Lab Results  Component Value Date   WBC 6.1 02/21/2019   HGB 12.5 02/21/2019   HCT 37.3 02/21/2019   MCV 86 02/21/2019   PLT 243 02/21/2019   No results found for: IRON, TIBC,  FERRITIN  Attestation Statements:   Reviewed by clinician on day of visit: allergies, medications, problem list, medical history, surgical history, family history, social history, and previous encounter notes.  Time spent on visit including pre-visit chart review and post-visit care and charting was 31 minutes.    I, Trixie Dredge, am acting as transcriptionist for Dennard Nip, MD.  I have reviewed the above documentation for accuracy and completeness, and I agree with the above. -  Dennard Nip, MD

## 2019-08-01 DIAGNOSIS — M5416 Radiculopathy, lumbar region: Secondary | ICD-10-CM | POA: Diagnosis not present

## 2019-08-10 ENCOUNTER — Encounter (INDEPENDENT_AMBULATORY_CARE_PROVIDER_SITE_OTHER): Payer: Self-pay | Admitting: Family Medicine

## 2019-08-12 ENCOUNTER — Other Ambulatory Visit (INDEPENDENT_AMBULATORY_CARE_PROVIDER_SITE_OTHER): Payer: Self-pay

## 2019-08-12 DIAGNOSIS — E8881 Metabolic syndrome: Secondary | ICD-10-CM

## 2019-08-12 MED ORDER — ONETOUCH ULTRA VI STRP: ORAL_STRIP | 0 refills | Status: DC

## 2019-08-13 DIAGNOSIS — N952 Postmenopausal atrophic vaginitis: Secondary | ICD-10-CM | POA: Diagnosis not present

## 2019-08-13 DIAGNOSIS — E8941 Symptomatic postprocedural ovarian failure: Secondary | ICD-10-CM | POA: Diagnosis not present

## 2019-08-13 DIAGNOSIS — R61 Generalized hyperhidrosis: Secondary | ICD-10-CM | POA: Diagnosis not present

## 2019-08-16 DIAGNOSIS — M5416 Radiculopathy, lumbar region: Secondary | ICD-10-CM | POA: Diagnosis not present

## 2019-08-16 DIAGNOSIS — E739 Lactose intolerance, unspecified: Secondary | ICD-10-CM | POA: Diagnosis not present

## 2019-08-16 DIAGNOSIS — Z6836 Body mass index (BMI) 36.0-36.9, adult: Secondary | ICD-10-CM | POA: Diagnosis not present

## 2019-08-16 DIAGNOSIS — I10 Essential (primary) hypertension: Secondary | ICD-10-CM | POA: Diagnosis not present

## 2019-08-19 ENCOUNTER — Other Ambulatory Visit (INDEPENDENT_AMBULATORY_CARE_PROVIDER_SITE_OTHER): Payer: Self-pay

## 2019-08-19 DIAGNOSIS — E8881 Metabolic syndrome: Secondary | ICD-10-CM

## 2019-08-19 MED ORDER — ONETOUCH ULTRA VI STRP: ORAL_STRIP | 0 refills | Status: DC

## 2019-08-26 ENCOUNTER — Other Ambulatory Visit: Payer: Self-pay

## 2019-08-26 ENCOUNTER — Encounter (INDEPENDENT_AMBULATORY_CARE_PROVIDER_SITE_OTHER): Payer: Self-pay | Admitting: Family Medicine

## 2019-08-26 ENCOUNTER — Ambulatory Visit (INDEPENDENT_AMBULATORY_CARE_PROVIDER_SITE_OTHER): Payer: PPO | Admitting: Family Medicine

## 2019-08-26 VITALS — BP 131/90 | HR 77 | Temp 98.6°F | Ht 68.0 in | Wt 235.0 lb

## 2019-08-26 DIAGNOSIS — E559 Vitamin D deficiency, unspecified: Secondary | ICD-10-CM | POA: Diagnosis not present

## 2019-08-26 DIAGNOSIS — R7303 Prediabetes: Secondary | ICD-10-CM

## 2019-08-26 DIAGNOSIS — F3289 Other specified depressive episodes: Secondary | ICD-10-CM | POA: Diagnosis not present

## 2019-08-26 DIAGNOSIS — Z6835 Body mass index (BMI) 35.0-35.9, adult: Secondary | ICD-10-CM | POA: Diagnosis not present

## 2019-08-26 MED ORDER — LIRAGLUTIDE 18 MG/3ML ~~LOC~~ SOPN: 1.8000 mg | PEN_INJECTOR | Freq: Every morning | SUBCUTANEOUS | 2 refills | Status: DC

## 2019-08-26 MED ORDER — BD PEN NEEDLE NANO U/F 32G X 4 MM MISC: 1.0000 | Freq: Every day | 0 refills | Status: DC

## 2019-08-26 MED ORDER — METFORMIN HCL 500 MG PO TABS: 500.0000 mg | ORAL_TABLET | Freq: Two times a day (BID) | ORAL | 0 refills | Status: DC

## 2019-08-26 MED ORDER — ONETOUCH ULTRA VI STRP: ORAL_STRIP | 0 refills | Status: DC

## 2019-08-26 MED ORDER — VITAMIN D (ERGOCALCIFEROL) 1.25 MG (50000 UNIT) PO CAPS: 50000.0000 [IU] | ORAL_CAPSULE | ORAL | 2 refills | Status: DC

## 2019-08-26 MED ORDER — BUPROPION HCL ER (SR) 150 MG PO TB12: 150.0000 mg | ORAL_TABLET | Freq: Every day | ORAL | 2 refills | Status: DC

## 2019-08-26 NOTE — Progress Notes (Signed)
Chief Complaint:   OBESITY [name] is here to discuss her progress with her obesity treatment plan along with follow-up of her obesity related diagnoses. [name] is on keeping a food journal and adhering to recommended goals of 1200-1400 calories and 90+ grams of protein daily and states she is following her eating plan approximately 50% of the time. [name] states she is doing 0 minutes 0 times per week.  Today's visit was #: 54 Starting weight: 241 lbs Starting date: 09/28/2016 Today's weight: 235 lbs Today's date: 08/26/2019 Total lbs lost to date: 6 Total lbs lost since last in-office visit: 0  Interim History: [name] is struggling to stay on track with her journaling. She has been told to decrease dairy by her GI and she has had increased pain, making meal planning difficult. She also notes increased temptations from family.  Subjective:   1. Pre-diabetes [name] is tolerating Victoza well, and she is struggling with weight gain. She denies nausea or vomiting.  2. Vitamin D deficiency [name] is stable on Vit D, and she denies nausea, vomiting, or muscle weakness.  3. Other depression, with emotional eating [name] is still struggling with emotional eating with increased stress and pain issues, and with increased temptations from her mother in-law.  Assessment/Plan:   1. Pre-diabetes [name] will continue to work on weight loss, exercise, and decreasing simple carbohydrates to help decrease the risk of diabetes. [name] agreed to increased Victoza to 1.8 mg #3 pens, and we will refill for 2 months (she is to increase dose to 5 clicks past 1.2 mg for now). We will refill pen needles #100 and test strips #100 with no refills, and we will refill metformin for 1 month.  - liraglutide (VICTOZA) 18 MG/3ML SOPN; Inject 0.3 mLs (1.8 mg total) into the skin every morning.  Dispense: 3 pen; Refill: 2 - Insulin Pen Needle (BD PEN NEEDLE NANO U/F) 32G X 4 MM MISC; 1 each by  Other route daily.  Dispense: 100 each; Refill: 0 - glucose blood (ONETOUCH ULTRA) test strip; TEST BLOOD SUGARS ONCE A DAY  Dispense: 100 strip; Refill: 0 - metFORMIN (GLUCOPHAGE) 500 MG tablet; Take 1 tablet (500 mg total) by mouth 2 (two) times daily with a meal.  Dispense: 60 tablet; Refill: 0  2. Vitamin D deficiency Low Vitamin D level contributes to fatigue and are associated with obesity, breast, and colon cancer. We will refill prescription Vitamin D for 2 months. [name] will follow-up for routine testing of Vitamin D, at least 2-3 times per year to avoid over-replacement.  - Vitamin D, Ergocalciferol, (DRISDOL) 1.25 MG (50000 UNIT) CAPS capsule; Take 1 capsule (50,000 Units total) by mouth every 7 (seven) days.  Dispense: 4 capsule; Refill: 2  3. Other depression, with emotional eating Behavior modification techniques were discussed today to help [name] deal with her emotional/non-hunger eating behaviors. We will refill Wellbutrin SR for 2 months. Orders and follow up as documented in patient record.   - buPROPion (WELLBUTRIN SR) 150 MG 12 hr tablet; Take 1 tablet (150 mg total) by mouth daily.  Dispense: 30 tablet; Refill: 2  4. Class 2 severe obesity with serious comorbidity and body mass index (BMI) of 35.0 to 35.9 in adult, unspecified obesity type [name] is currently in the action stage of change. As such, her goal is to continue with weight loss efforts. She has agreed to keeping a food journal and adhering to recommended goals of 1200-1400 calories and 90 grams of protein daily  or practicing portion control and making smarter food choices, such as increasing vegetables and decreasing simple carbohydrates.   Behavioral modification strategies: emotional eating strategies and dealing with family or coworker sabotage.  [name] has agreed to follow-up with our clinic in 6 weeks. She was informed of the importance of frequent follow-up visits to maximize her success with  intensive lifestyle modifications for her multiple health conditions.   Objective:   Blood pressure 131/90, pulse 77, temperature 98.6 F (37 C), temperature source Oral, height 5\' 8"  (1.727 m), weight 235 lb (106.6 kg), SpO2 99 %. Body mass index is 35.73 kg/m.  General: Cooperative, alert, well developed, in no acute distress. HEENT: Conjunctivae and lids unremarkable. Cardiovascular: Regular rhythm.  Lungs: Normal work of breathing. Neurologic: No focal deficits.   Lab Results  Component Value Date   CREATININE 0.67 10/29/2018   BUN 22 10/29/2018   NA 140 10/29/2018   K 4.5 10/29/2018   CL 103 10/29/2018   CO2 21 10/29/2018   Lab Results  Component Value Date   ALT 16 10/29/2018   AST 13 10/29/2018   ALKPHOS 92 10/29/2018   BILITOT 0.3 10/29/2018   Lab Results  Component Value Date   HGBA1C 5.3 07/08/2019   HGBA1C 5.4 02/21/2019   HGBA1C 5.2 10/29/2018   HGBA1C 5.4 05/03/2018   HGBA1C 5.4 01/09/2018   Lab Results  Component Value Date   INSULIN 17.3 02/21/2019   INSULIN 14.1 10/29/2018   INSULIN 17.8 05/03/2018   INSULIN 15.3 01/09/2018   INSULIN 10.4 07/04/2017   Lab Results  Component Value Date   TSH 1.830 02/21/2019   Lab Results  Component Value Date   CHOL 162 10/29/2018   HDL 62 10/29/2018   LDLCALC 85 10/29/2018   TRIG 76 10/29/2018   CHOLHDL 3 08/16/2016   Lab Results  Component Value Date   WBC 6.1 02/21/2019   HGB 12.5 02/21/2019   HCT 37.3 02/21/2019   MCV 86 02/21/2019   PLT 243 02/21/2019   No results found for: IRON, TIBC, FERRITIN  Obesity Behavioral Intervention Documentation for Insurance:   Approximately 15 minutes were spent on the discussion below.  ASK: We discussed the diagnosis of obesity with [name] today and [name] agreed to give [place] permission to discuss obesity behavioral modification therapy today.  ASSESS: [name] has the diagnosis of obesity and her BMI today is 35.74. [name] is in the action stage of  change.   ADVISE: [name] was educated on the multiple health risks of obesity as well as the benefit of weight loss to improve her health. She was advised of the need for long term treatment and the importance of lifestyle modifications to improve her current health and to decrease her risk of future health problems.  AGREE: Multiple dietary modification options and treatment options were discussed and [name] agreed to follow the recommendations documented in the above note.  ARRANGE: [name] was educated on the importance of frequent visits to treat obesity as outlined per CMS and USPSTF guidelines and agreed to schedule her next follow up appointment today.  Attestation Statements:   Reviewed by clinician on day of visit: allergies, medications, problem list, medical history, surgical history, family history, social history, and previous encounter notes.   I, Trixie Dredge, am acting as transcriptionist for Dennard Nip, MD.  I have reviewed the above documentation for accuracy and completeness, and I agree with the above. -  Dennard Nip, MD

## 2019-08-29 DIAGNOSIS — Z1152 Encounter for screening for COVID-19: Secondary | ICD-10-CM | POA: Diagnosis not present

## 2019-09-03 DIAGNOSIS — M5116 Intervertebral disc disorders with radiculopathy, lumbar region: Secondary | ICD-10-CM | POA: Diagnosis not present

## 2019-09-03 DIAGNOSIS — M5416 Radiculopathy, lumbar region: Secondary | ICD-10-CM | POA: Diagnosis not present

## 2019-09-09 ENCOUNTER — Ambulatory Visit (INDEPENDENT_AMBULATORY_CARE_PROVIDER_SITE_OTHER): Payer: PPO | Admitting: Internal Medicine

## 2019-09-09 ENCOUNTER — Other Ambulatory Visit: Payer: Self-pay

## 2019-09-09 ENCOUNTER — Encounter: Payer: Self-pay | Admitting: Internal Medicine

## 2019-09-09 DIAGNOSIS — T8149XA Infection following a procedure, other surgical site, initial encounter: Secondary | ICD-10-CM

## 2019-09-09 DIAGNOSIS — T7840XA Allergy, unspecified, initial encounter: Secondary | ICD-10-CM | POA: Insufficient documentation

## 2019-09-09 MED ORDER — DOXYCYCLINE HYCLATE 100 MG PO TABS: 100.0000 mg | ORAL_TABLET | Freq: Two times a day (BID) | ORAL | 0 refills | Status: DC

## 2019-09-09 MED ORDER — PREDNISONE 20 MG PO TABS
20.0000 mg | ORAL_TABLET | Freq: Every day | ORAL | 0 refills | Status: DC
Start: 2019-09-09 — End: 2019-10-14

## 2019-09-09 NOTE — Assessment & Plan Note (Signed)
Acute Allergic reaction to adhesives from surgery 6/1 Rash, blisters, itching, warmth No improvement with topical cortisone or Benadryl Has had this in the past and the only effective treatment was oral prednisone Ideally we want to avoid steroids, but she has had allergic reactions to adhesive/tape in the past requiring oral steroids so I will go ahead and start prednisone 20 mg daily for 5 days since topical medications have not been effective

## 2019-09-09 NOTE — Assessment & Plan Note (Signed)
Acute Back surgery 09/03/2019-discectomy Area around incision with combination of infection and allergic reaction from adhesives with blisters, erythema, itching, tenderness, warmth She has a low-grade fever Will start doxycycline twice daily x10 days Ideally we want to avoid steroids, but she has had allergic reactions to adhesive/tape in the past requiring oral steroids so I will go ahead and start prednisone 20 mg daily for 5 days since topical medications have not been effective

## 2019-09-09 NOTE — Patient Instructions (Addendum)
Start prednisone 20 mg daily - take with food.    Take doxycycline 1 twice daily for 10 days.     Please call if there is no improvement in your symptoms.

## 2019-09-09 NOTE — Progress Notes (Signed)
Subjective:    Patient ID: [name], female    DOB: 01-29-1971, 49 y.o.   MRN: [id number]  HPI The patient is here for an acute visit.  Discectomy on 09/03/19.  They did use glue and she is allergic to adhesives.  She started having an allergic reaction she thinks.  The area around the incision is red, itchy, warm and tender.  There was some discharge from the incision.  It was yellow in color.  She had several blisters in the area, which have scabbed over.  She started applying Benadryl cream and cortisone cream without any effect.  She called surgery and they were not able to get through initially and not able to see her for a couple of days.  She has had a low-grade fever and her temperature here is 100.5.  She has had some chills and sweats.  Overall she does not feel well.  She has decreased appetite.  She is worried about the possibility of infection.    Medications and allergies reviewed with patient and updated if appropriate.  Patient Active Problem List   Diagnosis Date Noted  . Dry cough 11/21/2017  . Rash and nonspecific skin eruption 11/21/2017  . Anxiety 08/17/2017  . 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 Other insomnia 06/06/2017  . Depression 05/16/2017  . Umbilical hernia 86/57/8469  . Diverticulosis of colon 02/26/2017  . Other hyperlipidemia 02/06/2017  . Asthma 11/24/2016  . Vitamin D deficiency 11/09/2016  . Class 1 obesity with serious comorbidity and body mass index (BMI) of 33.0 to 33.9 in adult 11/09/2016  . Right shoulder pain 10/11/2016  . AC (acromioclavicular) joint arthritis 09/13/2016  . Postsurgical menopause 08/16/2016  . Mucoid cyst of joint 11/04/2015  . External hemorrhoid 08/20/2015  . Prediabetes 06/05/2015  . GERD (gastroesophageal reflux disease) 06/04/2015  . Status post bilateral breast reduction 04/01/2015  . Cervical disc disorder with radiculopathy of cervical region 05/23/2014  . Ulnar neuropathy 01/16/2014  . Fibromyalgia  01/16/2014  . Recurrent nephrolithiasis 04/29/2013  . Arthralgia 04/29/2013  . Panic attacks 01/15/2013  . IBS (irritable bowel syndrome) 12/14/2011  . B12 deficiency 06/30/2009  . Essential hypertension 03/02/2007    Current Outpatient Medications on File Prior to Visit  Medication Sig Dispense Refill  . albuterol (VENTOLIN HFA) 108 (90 Base) MCG/ACT inhaler TAKE 2 PUFFS BY MOUTH EVERY 6 HOURS AS NEEDED FOR WHEEZE OR SHORTNESS OF BREATH 8.5 g 5  . aspirin 81 MG tablet Take 81 mg by mouth daily.     . blood glucose meter kit and supplies Dispense based on patient and insurance preference. Use up to two times daily as directed. (FOR ICD-10 E10.9, E11.9). 1 each 0  . buPROPion (WELLBUTRIN SR) 150 MG 12 hr tablet Take 1 tablet (150 mg total) by mouth daily. 30 tablet 2  . celecoxib (CELEBREX) 200 MG capsule Take 200 mg by mouth 2 (two) times daily.    . citalopram (CELEXA) 20 MG tablet Take 1 tablet (20 mg total) by mouth daily. 90 tablet 1  . estradiol (VIVELLE-DOT) 0.05 MG/24HR patch estradiol 0.05 mg/24 hr semiweekly transdermal patch    . famotidine (PEPCID) 20 MG tablet Take 1 tablet (20 mg total) by mouth at bedtime. 90 tablet 1  . Fluticasone-Salmeterol (ADVAIR DISKUS) 250-50 MCG/DOSE AEPB Inhale 1 puff into the lungs 2 (two) times daily. 1 each 3  . Gabapentin, Once-Daily, (GRALISE STARTER) 300 & 600 MG MISC Take by mouth.    [name] glucose blood (ONETOUCH  ULTRA) test strip TEST BLOOD SUGARS ONCE A DAY 100 strip 0  . hydrochlorothiazide (MICROZIDE) 12.5 MG capsule TAKE 1 CAPSULE BY MOUTH EVERY DAY 90 capsule 1  . hydroxychloroquine (PLAQUENIL) 200 MG tablet Take 200 mg by mouth 2 (two) times daily.    . Insulin Pen Needle (BD PEN NEEDLE NANO U/F) 32G X 4 MM MISC 1 each by Other route daily. 100 each 0  . liraglutide (VICTOZA) 18 MG/3ML SOPN Inject 0.3 mLs (1.8 mg total) into the skin every morning. 3 pen 2  . lisinopril (ZESTRIL) 40 MG tablet Take 1 tablet (40 mg total) by mouth daily. 90  tablet 1  . metFORMIN (GLUCOPHAGE) 500 MG tablet Take 1 tablet (500 mg total) by mouth 2 (two) times daily with a meal. 60 tablet 0  . montelukast (SINGULAIR) 10 MG tablet TAKE 1 TABLET BY MOUTH EVERY DAY AT BEDTIME 90 tablet 1  . omeprazole (PRILOSEC) 20 MG capsule Take 1 capsule (20 mg total) by mouth daily. 90 capsule 1  . OneTouch Delica Lancets 39Q MISC 1 each by Does not apply route 2 (two) times daily. 100 each 0  . Vitamin D, Ergocalciferol, (DRISDOL) 1.25 MG (50000 UNIT) CAPS capsule Take 1 capsule (50,000 Units total) by mouth every 7 (seven) days. 4 capsule 2  . HYDROcodone-acetaminophen (NORCO/VICODIN) 5-325 MG tablet Take 1 tablet by mouth every 6 (six) hours as needed for moderate pain.     No current facility-administered medications on file prior to visit.    Past Medical History:  Diagnosis Date  . Anemia   . Anxiety   . Arthritis    inflammitory  . Asthma   . B12 deficiency   . Back pain   . Chronic joint pain   . Constipation   . DDD (degenerative disc disease), lumbar    low back and neck and shoulders  . Depression    during divorce & legal matters  . Fatty liver   . Fibromyalgia   . Gallbladder problem   . GERD (gastroesophageal reflux disease)   . History of kidney stones    Dr Phebe Colla  . History of polycystic ovarian disease S/P BSO  . Hx of anxiety disorder   . Hypertension    a  . Hypothyroidism    no meds  . IBS (irritable bowel syndrome)   . IBS (irritable bowel syndrome)   . Infertility, female   . Joint pain   . Kidney problem   . Lactose intolerance   . Lupus (HCC) hx positive ANA   followed by Dr Gavin Pound; possible lupus  . Multiple food allergies   . Myalgia   . Osteoarthritis   . 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 POLYCYSTIC OVARIAN DISEASE 03/02/2007   Qualifier: Diagnosis of  By: Linna Darner MD, Rae Mar BSO for cysts & endometriosis; Dr Reino Kent, Gyn Seeing Dr Paula Compton    . PONV (postoperative nausea and  vomiting)   . Prediabetes   . Sweating profusely   . Symptomatic mammary hypertrophy   . URI (upper respiratory infection)    currently on cefdinir    Past Surgical History:  Procedure Laterality Date  . [place]   exploratory lap  . BREAST REDUCTION SURGERY Bilateral 03/26/2015   Procedure: BILATERAL BREAST REDUCTION ;  Surgeon: Wallace Going, DO;  Location: [place];  Service: Plastics;  Laterality: Bilateral;  . CARPAL TUNNEL RELEASE  bilateral; Dr Sherwood Gambler  . COLONOSCOPY     in 1990s  . CYSTO/ RIGHT RETROGRADE URETERAL PYELOGRAM  07-09-2004   HX BILATERAL RENAL STONES/ RIGHT FLANK PAIN  . CYSTOSCOPY W/ URETERAL STENT PLACEMENT  11/12/2011   Procedure: CYSTOSCOPY WITH RETROGRADE PYELOGRAM/URETERAL STENT PLACEMENT;  Surgeon: Alexis Frock, MD;  Location: [facility];  Service: Urology;  Laterality: Right;  . CYSTOSCOPY W/ URETERAL STENT REMOVAL  12/07/2011   Procedure: CYSTOSCOPY WITH STENT REMOVAL;  Surgeon: Alexis Frock, MD;  Location: [facility];  Service: Urology;  Laterality: Right;  . CYSTOSCOPY/RETROGRADE/URETEROSCOPY/STONE EXTRACTION WITH BASKET  12/07/2011   Procedure: CYSTOSCOPY/RETROGRADE/URETEROSCOPY/STONE EXTRACTION WITH BASKET;  Surgeon: Alexis Frock, MD;  Location: [facility];  Service: Urology;  Laterality: Right;  . DILATION AND CURETTAGE OF UTERUS     X5  . EXCISION LEFT BARTHOLIN GLAND  02-05-2002  . EYE SURGERY     Retinal tears surgery bilateral  . LAPAROSCOPIC ASSISTED VAGINAL HYSTERECTOMY  2000  . LAPAROSCOPIC CHOLECYSTECTOMY  05-31-2007  . LAPAROSCOPIC LASER ABLATION ENDOMETRIOSIS AND LEFT SALPINGO-OOPHORECTOMY  11-03-1999  . LAPAROSCOPY WITH RIGHT SALPINGO-OOPHECTOMY/ LYSIS ADHESIONS AND ABLATION ENDOMETRIOSIS  05-17-2001  . LIPOSUCTION Bilateral 03/26/2015   Procedure: WITH LIPOSUCTION;  Surgeon: Wallace Going, DO;  Location: [place];   Service: Plastics;  Laterality: Bilateral;  . LUMBAR LAMINECTOMY  2003   L4 - L5  . PLANTAR FASCIA SURGERY     right  . PLANTAR FASCIA SURGERY  02/2011   left  . REDUCTION MAMMAPLASTY    . RIGHT URETEROSCOPIC STONE EXTRACTION  08-04-2000  . TONSILLECTOMY    . URETERAL STENT PLACEMENT  11/12/11   Dr Tresa Moore    Social History   Socioeconomic History  . Marital status: Married    Spouse name: Darnelle Maffucci  . Number of children: 0  . Years of education: college  . Highest education level: Not on file  Occupational History  . Occupation: disabled  Tobacco Use  . Smoking status: Never Smoker  . Smokeless tobacco: Never Used  Substance and Sexual Activity  . Alcohol use: Yes    Alcohol/week: 1.0 standard drinks    Types: 1 Shots of liquor per week    Comment:  very rarely  . Drug use: No  . Sexual activity: Yes    Birth control/protection: Surgical  Other Topics Concern  . Not on file  Social History Narrative   Patient Lives at home with her husband Darnelle Maffucci)   Disabled.   Education two years of college.   Right handed.   Caffeine coffee and sweet tea. Not daily.   Social Determinants of Health   Financial Resource Strain:   . Difficulty of Paying Living Expenses:   Food Insecurity:   . Worried About Charity fundraiser in the Last Year:   . Arboriculturist in the Last Year:   Transportation Needs:   . Film/video editor (Medical):   [name] Lack of Transportation (Non-Medical):   Physical Activity:   . Days of Exercise per Week:   . Minutes of Exercise per Session:   Stress:   . Feeling of Stress :   Social Connections:   . Frequency of Communication with Friends and Family:   . Frequency of Social Gatherings with Friends and Family:   . Attends Religious Services:   . Active Member of Clubs or Organizations:   . Attends Archivist Meetings:   [name] Marital Status:     Family History  Problem Relation Age of Onset  . Hypertension Mother   . Colon polyps  Mother   . Atrial fibrillation Mother        Dr Angelena Form  . Hyperlipidemia Mother   . Heart disease Mother   . Anxiety disorder Mother   . Sleep apnea Mother   . Obesity Mother   . Heart attack Father 76  . Diabetes Father   . Hypertension Father   . Hyperlipidemia Father   . Heart disease Father   . Stroke Father   . Obesity Father   . Hypertension Sister   . Heart attack Paternal Grandmother 82  . Breast cancer Paternal Aunt   . Colon cancer Maternal Uncle   . Colon cancer Paternal Uncle   . Colon polyps Maternal Grandmother   . Stroke Maternal Grandmother 76  . Diabetes Paternal Grandfather   . Stroke Maternal Grandfather 37    Review of Systems  Constitutional: Positive for appetite change (dec), chills, diaphoresis and fever.  Gastrointestinal: Positive for diarrhea, nausea and vomiting.  Skin: Positive for rash and wound.  Neurological: Positive for headaches.       Objective:   Vitals:   09/09/19 1606  BP: 130/78  Pulse: 99  Temp: (!) 100.5 F (38.1 C)  SpO2: 99%   BP Readings from Last 3 Encounters:  09/09/19 130/78  08/26/19 131/90  07/30/19 137/83   Wt Readings from Last 3 Encounters:  09/09/19 231 lb (104.8 kg)  08/26/19 235 lb (106.6 kg)  07/30/19 230 lb (104.3 kg)   Body mass index is 35.12 kg/m.   Physical Exam Constitutional:      General: She is not in acute distress.    Appearance: Normal appearance. She is diaphoretic (mild). She is not ill-appearing or toxic-appearing.  HENT:     Head: Normocephalic and atraumatic.  Skin:    General: Skin is warm.     Findings: Rash (large patch of erythema around incision with areas patchy erthyema and sattelite lesion,, several scabbed blisters throughout, direct area around incicion that used to be all blisters now scabbing over - no acitve discharge - mildly tender. area is warm) present.  Neurological:     Mental Status: She is alert.                 Assessment & Plan:    See  Problem List for Assessment and Plan of chronic medical problems.    This visit occurred during the SARS-CoV-2 public health emergency.  Safety protocols were in place, including screening questions prior to the visit, additional usage of staff PPE, and extensive cleaning of exam room while observing appropriate contact time as indicated for disinfecting solutions.

## 2019-09-10 ENCOUNTER — Encounter: Payer: Self-pay | Admitting: Internal Medicine

## 2019-09-30 ENCOUNTER — Encounter (INDEPENDENT_AMBULATORY_CARE_PROVIDER_SITE_OTHER): Payer: Self-pay | Admitting: Family Medicine

## 2019-09-30 ENCOUNTER — Other Ambulatory Visit (INDEPENDENT_AMBULATORY_CARE_PROVIDER_SITE_OTHER): Payer: Self-pay

## 2019-09-30 DIAGNOSIS — F3289 Other specified depressive episodes: Secondary | ICD-10-CM

## 2019-09-30 MED ORDER — BUPROPION HCL ER (SR) 150 MG PO TB12: 150.0000 mg | ORAL_TABLET | Freq: Every day | ORAL | 0 refills | Status: DC

## 2019-10-14 ENCOUNTER — Other Ambulatory Visit: Payer: Self-pay

## 2019-10-14 ENCOUNTER — Encounter (INDEPENDENT_AMBULATORY_CARE_PROVIDER_SITE_OTHER): Payer: Self-pay | Admitting: Family Medicine

## 2019-10-14 ENCOUNTER — Ambulatory Visit (INDEPENDENT_AMBULATORY_CARE_PROVIDER_SITE_OTHER): Payer: PPO | Admitting: Family Medicine

## 2019-10-14 VITALS — BP 130/79 | HR 78 | Temp 98.6°F | Ht 68.0 in | Wt 235.0 lb

## 2019-10-14 DIAGNOSIS — E559 Vitamin D deficiency, unspecified: Secondary | ICD-10-CM | POA: Diagnosis not present

## 2019-10-14 DIAGNOSIS — F3289 Other specified depressive episodes: Secondary | ICD-10-CM

## 2019-10-14 DIAGNOSIS — R7303 Prediabetes: Secondary | ICD-10-CM | POA: Diagnosis not present

## 2019-10-14 DIAGNOSIS — Z6835 Body mass index (BMI) 35.0-35.9, adult: Secondary | ICD-10-CM | POA: Diagnosis not present

## 2019-10-14 MED ORDER — LIRAGLUTIDE 18 MG/3ML ~~LOC~~ SOPN: 1.8000 mg | PEN_INJECTOR | Freq: Every morning | SUBCUTANEOUS | 0 refills | Status: DC

## 2019-10-14 MED ORDER — METFORMIN HCL 500 MG PO TABS: 500.0000 mg | ORAL_TABLET | Freq: Two times a day (BID) | ORAL | 0 refills | Status: DC

## 2019-10-14 MED ORDER — BUPROPION HCL ER (SR) 150 MG PO TB12: 150.0000 mg | ORAL_TABLET | Freq: Every day | ORAL | 0 refills | Status: DC

## 2019-10-14 MED ORDER — VITAMIN D (ERGOCALCIFEROL) 1.25 MG (50000 UNIT) PO CAPS: 50000.0000 [IU] | ORAL_CAPSULE | ORAL | 0 refills | Status: DC

## 2019-10-14 MED ORDER — BD PEN NEEDLE NANO U/F 32G X 4 MM MISC: 1.0000 | Freq: Every day | 0 refills | Status: DC

## 2019-10-16 NOTE — Progress Notes (Signed)
Chief Complaint:   OBESITY [name] is here to discuss her progress with her obesity treatment plan along with follow-up of her obesity related diagnoses. [name] is on keeping a food journal and adhering to recommended goals of 1200-1400 calories and 90 grams of protein daily or practicing portion control and making smarter food choices, such as increasing vegetables and decreasing simple carbohydrates and states she is following her eating plan approximately 40% of the time. [name] states she is staying busy, and walking 10,000 steps 4 times per week.  Today's visit was #: 63 Starting weight: 241 lbs Starting date: 09/28/2016 Today's weight: 235 lbs Today's date: 10/14/2019 Total lbs lost to date: 6 Total lbs lost since last in-office visit: 0  Interim History: [name] has done well maintaining her weight since her last visit. She had surgery on her back and has been recovering but she had been on prednisone for an allergic reaction form adhesives.  Subjective:   1. Pre-diabetes [name] is still struggling with polyphagia. She has not maximized out her dose of Victoza.  2. Other depression, with emotional eating [name] is stable on Wellbutrin, and her blood pressure is well controlled.  3. Vitamin D deficiency [name] is stable on Vit D, and she requests a refill today.  Assessment/Plan:   1. Pre-iabetes [name] will continue to work on weight loss, exercise, and decreasing simple carbohydrates to help decrease the risk of diabetes. We will refill Victoza for 1 month and [name] will increase Victoza to 1.8 mg; and we will refill pen needles #100 with no refills. We will refill metformin for 1 month.  - liraglutide (VICTOZA) 18 MG/3ML SOPN; Inject 0.3 mLs (1.8 mg total) into the skin every morning.  Dispense: 3 pen; Refill: 0 - metFORMIN (GLUCOPHAGE) 500 MG tablet; Take 1 tablet (500 mg total) by mouth 2 (two) times daily with a meal.  Dispense: 60 tablet; Refill:  0 - Insulin Pen Needle (BD PEN NEEDLE NANO U/F) 32G X 4 MM MISC; 1 each by Other route daily.  Dispense: 100 each; Refill: 0  2. Other depression, with emotional eating Behavior modification techniques were discussed today to help [name] deal with her emotional/non-hunger eating behaviors. We will refill Wellbutrin SR for 1 month. Orders and follow up as documented in patient record.   - buPROPion (WELLBUTRIN SR) 150 MG 12 hr tablet; Take 1 tablet (150 mg total) by mouth daily.  Dispense: 30 tablet; Refill: 0  3. Vitamin D deficiency Low Vitamin D level contributes to fatigue and are associated with obesity, breast, and colon cancer. We will refill prescription Vitamin D for 1 month. [name] will follow-up for routine testing of Vitamin D, at least 2-3 times per year to avoid over-replacement.  - Vitamin D, Ergocalciferol, (DRISDOL) 1.25 MG (50000 UNIT) CAPS capsule; Take 1 capsule (50,000 Units total) by mouth every 7 (seven) days.  Dispense: 4 capsule; Refill: 0  4. Class 2 severe obesity with serious comorbidity and body mass index (BMI) of 35.0 to 35.9 in adult, unspecified obesity type [facility]) [name] is currently in the action stage of change. As such, her goal is to continue with weight loss efforts. She has agreed to keeping a food journal and adhering to recommended goals of 1200-1400 calories and 90 grams of protein daily.   Exercise goals: As is.  Behavioral modification strategies: meal planning and cooking strategies.  [name] has agreed to follow-up with our clinic in 4 weeks. She was informed of the importance of frequent  follow-up visits to maximize her success with intensive lifestyle modifications for her multiple health conditions.   Objective:   Blood pressure 130/79, pulse 78, temperature 98.6 F (37 C), temperature source Oral, height 5\' 8"  (1.727 m), weight 235 lb (106.6 kg), SpO2 99 %. Body mass index is 35.73 kg/m.  General: Cooperative, alert, well developed,  in no acute distress. HEENT: Conjunctivae and lids unremarkable. Cardiovascular: Regular rhythm.  Lungs: Normal work of breathing. Neurologic: No focal deficits.   Lab Results  Component Value Date   CREATININE 0.67 10/29/2018   BUN 22 10/29/2018   NA 140 10/29/2018   K 4.5 10/29/2018   CL 103 10/29/2018   CO2 21 10/29/2018   Lab Results  Component Value Date   ALT 16 10/29/2018   AST 13 10/29/2018   ALKPHOS 92 10/29/2018   BILITOT 0.3 10/29/2018   Lab Results  Component Value Date   HGBA1C 5.3 07/08/2019   HGBA1C 5.4 02/21/2019   HGBA1C 5.2 10/29/2018   HGBA1C 5.4 05/03/2018   HGBA1C 5.4 01/09/2018   Lab Results  Component Value Date   INSULIN 17.3 02/21/2019   INSULIN 14.1 10/29/2018   INSULIN 17.8 05/03/2018   INSULIN 15.3 01/09/2018   INSULIN 10.4 07/04/2017   Lab Results  Component Value Date   TSH 1.830 02/21/2019   Lab Results  Component Value Date   CHOL 162 10/29/2018   HDL 62 10/29/2018   LDLCALC 85 10/29/2018   TRIG 76 10/29/2018   CHOLHDL 3 08/16/2016   Lab Results  Component Value Date   WBC 6.1 02/21/2019   HGB 12.5 02/21/2019   HCT 37.3 02/21/2019   MCV 86 02/21/2019   PLT 243 02/21/2019   No results found for: IRON, TIBC, FERRITIN  Obesity Behavioral Intervention Documentation for Insurance:   Approximately 15 minutes were spent on the discussion below.  ASK: We discussed the diagnosis of obesity with [name] today and [name] agreed to give [place] permission to discuss obesity behavioral modification therapy today.  ASSESS: [name] has the diagnosis of obesity and her BMI today is 35.74. [name] is in the action stage of change.   ADVISE: [name] was educated on the multiple health risks of obesity as well as the benefit of weight loss to improve her health. She was advised of the need for long term treatment and the importance of lifestyle modifications to improve her current health and to decrease her risk of future health  problems.  AGREE: Multiple dietary modification options and treatment options were discussed and [name] agreed to follow the recommendations documented in the above note.  ARRANGE: [name] was educated on the importance of frequent visits to treat obesity as outlined per CMS and USPSTF guidelines and agreed to schedule her next follow up appointment today.  Attestation Statements:   Reviewed by clinician on day of visit: allergies, medications, problem list, medical history, surgical history, family history, social history, and previous encounter notes.   I, Trixie Dredge, am acting as transcriptionist for Dennard Nip, MD.  I have reviewed the above documentation for accuracy and completeness, and I agree with the above. -  Dennard Nip, MD

## 2019-10-29 ENCOUNTER — Ambulatory Visit: Payer: PPO | Admitting: Internal Medicine

## 2019-11-09 ENCOUNTER — Other Ambulatory Visit: Payer: Self-pay | Admitting: Internal Medicine

## 2019-11-10 ENCOUNTER — Other Ambulatory Visit: Payer: Self-pay | Admitting: Internal Medicine

## 2019-11-11 ENCOUNTER — Encounter (INDEPENDENT_AMBULATORY_CARE_PROVIDER_SITE_OTHER): Payer: Self-pay | Admitting: Family Medicine

## 2019-11-11 ENCOUNTER — Other Ambulatory Visit: Payer: Self-pay

## 2019-11-11 ENCOUNTER — Ambulatory Visit (INDEPENDENT_AMBULATORY_CARE_PROVIDER_SITE_OTHER): Payer: PPO | Admitting: Family Medicine

## 2019-11-11 ENCOUNTER — Other Ambulatory Visit: Payer: Self-pay | Admitting: Internal Medicine

## 2019-11-11 VITALS — BP 140/85 | HR 72 | Temp 98.4°F | Ht 68.0 in | Wt 237.0 lb

## 2019-11-11 DIAGNOSIS — R7303 Prediabetes: Secondary | ICD-10-CM

## 2019-11-11 DIAGNOSIS — Z6836 Body mass index (BMI) 36.0-36.9, adult: Secondary | ICD-10-CM

## 2019-11-11 DIAGNOSIS — E559 Vitamin D deficiency, unspecified: Secondary | ICD-10-CM

## 2019-11-11 DIAGNOSIS — F3289 Other specified depressive episodes: Secondary | ICD-10-CM | POA: Diagnosis not present

## 2019-11-11 MED ORDER — TOPIRAMATE 50 MG PO TABS: 50.0000 mg | ORAL_TABLET | Freq: Every day | ORAL | 0 refills | Status: DC

## 2019-11-11 MED ORDER — VITAMIN D (ERGOCALCIFEROL) 1.25 MG (50000 UNIT) PO CAPS: 50000.0000 [IU] | ORAL_CAPSULE | ORAL | 0 refills | Status: DC

## 2019-11-11 MED ORDER — METFORMIN HCL 500 MG PO TABS: 500.0000 mg | ORAL_TABLET | Freq: Two times a day (BID) | ORAL | 0 refills | Status: DC

## 2019-11-11 NOTE — Progress Notes (Signed)
Chief Complaint:   OBESITY [name] is here to discuss her progress with her obesity treatment plan along with follow-up of her obesity related diagnoses. [name] is on keeping a food journal and adhering to recommended goals of 1200-1400 calories and 90 grams of protein daily and states she is following her eating plan approximately 50% of the time. [name] states she is walking for 15 minutes 4-5 times per week.  Today's visit was #: 57 Starting weight: 241 lbs Starting date: 09/28/2016 Today's weight: 237 lbs Today's date: 11/11/2019 Total lbs lost to date: 4 Total lbs lost since last in-office visit: 0  Interim History: [name] has been struggling with weight gain and increased hunger especially in the evenings. She is already on a GLP-1, and her insurance and she plans to do some celebration eating.  Subjective:   1. Pre-diabetes [name] is stable on Victoza, and she still struggles with PM polyphagia.  2. Vitamin D deficiency [name] is stable on Vit D, and she denies nausea or vomiting.  3. Other depression with emotional eating [name] would like to restart Topamax due to increased cravings and struggling with hunger.  Assessment/Plan:   1. Pre-diabetes [name] will continue to work on weight loss, exercise, and work or increasing protein, and decreasing simple carbohydrates to help decrease the risk of diabetes. We will refill metformin for 1 month and we will refill Victoza 1.8 mg SubQ daily #3 pens for 1 month.    - Comprehensive metabolic panel - CBC with Differential/Platelet - Hemoglobin A1c - Insulin, random - Lipid Panel With LDL/HDL Ratio - Vitamin B12 - T3 - T4, free - TSH - metFORMIN (GLUCOPHAGE) 500 MG tablet; Take 1 tablet (500 mg total) by mouth 2 (two) times daily with a meal.  Dispense: 60 tablet; Refill: 0  2. Vitamin D deficiency Low Vitamin D level contributes to fatigue and are associated with obesity, breast, and colon cancer. We  will refill prescription Vitamin D for 1 month. [name] will follow-up for routine testing of Vitamin D, at least 2-3 times per year to avoid over-replacement.  - VITAMIN D 25 Hydroxy (Vit-D Deficiency, Fractures) - Vitamin D, Ergocalciferol, (DRISDOL) 1.25 MG (50000 UNIT) CAPS capsule; Take 1 capsule (50,000 Units total) by mouth every 7 (seven) days.  Dispense: 4 capsule; Refill: 0  3. Other depression with emotional eating Behavior modification techniques were discussed today to help [name] deal with her emotional/non-hunger eating behaviors. [name] agreed to restart Topamax 50 mg qhs with no refills. Orders and follow up as documented in patient record.   - topiramate (TOPAMAX) 50 MG tablet; Take 1 tablet (50 mg total) by mouth at bedtime.  Dispense: 30 tablet; Refill: 0  4. Class 2 severe obesity with serious comorbidity and body mass index (BMI) of 36.0 to 36.9 in adult, unspecified obesity type [name]) [name] is currently in the action stage of change. As such, her goal is to continue with weight loss efforts. She has agreed to keeping a food journal and adhering to recommended goals of 1200-1400 calories and 90 grams of protein daily.   Exercise goals: As is.  Behavioral modification strategies: increasing lean protein intake and increasing vegetables.  [name] has agreed to follow-up with our clinic in 3 to 4 weeks. She was informed of the importance of frequent follow-up visits to maximize her success with intensive lifestyle modifications for her multiple health conditions.   [name] was informed we would discuss her lab results at her next visit unless there  is a critical issue that needs to be addressed sooner. [name] agreed to keep her next visit at the agreed upon time to discuss these results.  Objective:   Blood pressure 140/85, pulse 72, temperature 98.4 F (36.9 C), temperature source Oral, height 5\' 8"  (1.727 m), weight 237 lb (107.5 kg), SpO2 99 %. Body mass  index is 36.04 kg/m.  General: Cooperative, alert, well developed, in no acute distress. HEENT: Conjunctivae and lids unremarkable. Cardiovascular: Regular rhythm.  Lungs: Normal work of breathing. Neurologic: No focal deficits.   Lab Results  Component Value Date   CREATININE 0.67 10/29/2018   BUN 22 10/29/2018   NA 140 10/29/2018   K 4.5 10/29/2018   CL 103 10/29/2018   CO2 21 10/29/2018   Lab Results  Component Value Date   ALT 16 10/29/2018   AST 13 10/29/2018   ALKPHOS 92 10/29/2018   BILITOT 0.3 10/29/2018   Lab Results  Component Value Date   HGBA1C 5.3 07/08/2019   HGBA1C 5.4 02/21/2019   HGBA1C 5.2 10/29/2018   HGBA1C 5.4 05/03/2018   HGBA1C 5.4 01/09/2018   Lab Results  Component Value Date   INSULIN 17.3 02/21/2019   INSULIN 14.1 10/29/2018   INSULIN 17.8 05/03/2018   INSULIN 15.3 01/09/2018   INSULIN 10.4 07/04/2017   Lab Results  Component Value Date   TSH 1.830 02/21/2019   Lab Results  Component Value Date   CHOL 162 10/29/2018   HDL 62 10/29/2018   LDLCALC 85 10/29/2018   TRIG 76 10/29/2018   CHOLHDL 3 08/16/2016   Lab Results  Component Value Date   WBC 6.1 02/21/2019   HGB 12.5 02/21/2019   HCT 37.3 02/21/2019   MCV 86 02/21/2019   PLT 243 02/21/2019   No results found for: IRON, TIBC, FERRITIN  Obesity Behavioral Intervention Documentation for Insurance:   Approximately 15 minutes were spent on the discussion below.  ASK: We discussed the diagnosis of obesity with [name] today and [name] agreed to give [place] permission to discuss obesity behavioral modification therapy today.  ASSESS: [name] has the diagnosis of obesity and her BMI today is 36.04. [name] is in the action stage of change.   ADVISE: [name] was educated on the multiple health risks of obesity as well as the benefit of weight loss to improve her health. She was advised of the need for long term treatment and the importance of lifestyle modifications to  improve her current health and to decrease her risk of future health problems.  AGREE: Multiple dietary modification options and treatment options were discussed and Shylo agreed to follow the recommendations documented in the above note.  ARRANGE: Jaice was educated on the importance of frequent visits to treat obesity as outlined per CMS and USPSTF guidelines and agreed to schedule her next follow up appointment today.  Attestation Statements:   Reviewed by clinician on day of visit: allergies, medications, problem list, medical history, surgical history, family history, social history, and previous encounter notes.   I, Trixie Dredge, am acting as transcriptionist for Dennard Nip, MD.  I have reviewed the above documentation for accuracy and completeness, and I agree with the above. -  Dennard Nip, MD

## 2019-11-12 LAB — LIPID PANEL WITH LDL/HDL RATIO
Cholesterol, Total: 152 mg/dL (ref 100–199)
HDL: 58 mg/dL (ref >39)
LDL Chol Calc (NIH): 72 mg/dL (ref 0–99)
LDL/HDL Ratio: 1.2 ratio (ref 0.0–3.2)
Triglycerides: 122 mg/dL (ref 0–149)
VLDL Cholesterol Cal: 22 mg/dL (ref 5–40)

## 2019-11-12 LAB — CBC WITH DIFFERENTIAL/PLATELET
Basophils Absolute: 0.1 10*3/uL (ref 0.0–0.2)
Basos: 1 %
EOS (ABSOLUTE): 0.2 10*3/uL (ref 0.0–0.4)
Eos: 3 %
Hematocrit: 37.2 % (ref 34.0–46.6)
Hemoglobin: 12.2 g/dL (ref 11.1–15.9)
Immature Grans (Abs): 0 10*3/uL (ref 0.0–0.1)
Immature Granulocytes: 0 %
Lymphocytes Absolute: 1.7 10*3/uL (ref 0.7–3.1)
Lymphs: 29 %
MCH: 28.1 pg (ref 26.6–33.0)
MCHC: 32.8 g/dL (ref 31.5–35.7)
MCV: 86 fL (ref 79–97)
Monocytes Absolute: 0.5 10*3/uL (ref 0.1–0.9)
Monocytes: 8 %
Neutrophils Absolute: 3.4 10*3/uL (ref 1.4–7.0)
Neutrophils: 59 %
Platelets: 246 10*3/uL (ref 150–450)
RBC: 4.34 x10E6/uL (ref 3.77–5.28)
RDW: 14.3 % (ref 11.7–15.4)
WBC: 5.8 10*3/uL (ref 3.4–10.8)

## 2019-11-12 LAB — COMPREHENSIVE METABOLIC PANEL
ALT: 19 IU/L (ref 0–32)
AST: 20 IU/L (ref 0–40)
Albumin/Globulin Ratio: 1.6 (ref 1.2–2.2)
Albumin: 3.9 g/dL (ref 3.8–4.8)
Alkaline Phosphatase: 68 IU/L (ref 48–121)
BUN/Creatinine Ratio: 34 — ABNORMAL HIGH (ref 9–23)
BUN: 20 mg/dL (ref 6–24)
Bilirubin Total: <0.2 mg/dL (ref 0.0–1.2)
CO2: 23 mmol/L (ref 20–29)
Calcium: 8.7 mg/dL (ref 8.7–10.2)
Chloride: 106 mmol/L (ref 96–106)
Creatinine, Ser: 0.58 mg/dL (ref 0.57–1.00)
GFR calc Af Amer: 125 mL/min/{1.73_m2} (ref >59)
GFR calc non Af Amer: 109 mL/min/{1.73_m2} (ref >59)
Globulin, Total: 2.4 g/dL (ref 1.5–4.5)
Glucose: 80 mg/dL (ref 65–99)
Potassium: 4 mmol/L (ref 3.5–5.2)
Sodium: 141 mmol/L (ref 134–144)
Total Protein: 6.3 g/dL (ref 6.0–8.5)

## 2019-11-12 LAB — HEMOGLOBIN A1C
Est. average glucose Bld gHb Est-mCnc: 111 mg/dL
Hgb A1c MFr Bld: 5.5 % (ref 4.8–5.6)

## 2019-11-12 LAB — INSULIN, RANDOM: INSULIN: 16.4 u[IU]/mL (ref 2.6–24.9)

## 2019-11-12 LAB — TSH: TSH: 1.8 u[IU]/mL (ref 0.450–4.500)

## 2019-11-12 LAB — VITAMIN D 25 HYDROXY (VIT D DEFICIENCY, FRACTURES): Vit D, 25-Hydroxy: 37.1 ng/mL (ref 30.0–100.0)

## 2019-11-12 LAB — VITAMIN B12: Vitamin B-12: 394 pg/mL (ref 232–1245)

## 2019-11-12 LAB — T3: T3, Total: 179 ng/dL (ref 71–180)

## 2019-11-12 LAB — T4, FREE: Free T4: 0.83 ng/dL (ref 0.82–1.77)

## 2019-11-26 MED ORDER — LIRAGLUTIDE 18 MG/3ML ~~LOC~~ SOPN: 1.8000 mg | PEN_INJECTOR | Freq: Every morning | SUBCUTANEOUS | 0 refills | Status: DC

## 2019-12-02 ENCOUNTER — Other Ambulatory Visit: Payer: Self-pay

## 2019-12-02 ENCOUNTER — Ambulatory Visit (INDEPENDENT_AMBULATORY_CARE_PROVIDER_SITE_OTHER): Payer: PPO | Admitting: Family Medicine

## 2019-12-02 ENCOUNTER — Encounter (INDEPENDENT_AMBULATORY_CARE_PROVIDER_SITE_OTHER): Payer: Self-pay | Admitting: Family Medicine

## 2019-12-02 VITALS — BP 122/78 | HR 84 | Temp 98.2°F | Ht 68.0 in | Wt 238.0 lb

## 2019-12-02 DIAGNOSIS — Z6836 Body mass index (BMI) 36.0-36.9, adult: Secondary | ICD-10-CM | POA: Diagnosis not present

## 2019-12-02 DIAGNOSIS — E8881 Metabolic syndrome: Secondary | ICD-10-CM | POA: Diagnosis not present

## 2019-12-03 NOTE — Progress Notes (Signed)
Chief Complaint:   OBESITY [name] is here to discuss her progress with her obesity treatment plan along with follow-up of her obesity related diagnoses. [name] is on keeping a food journal and adhering to recommended goals of 1200-1400 calories and 90 grams of protein daily and states she is following her eating plan approximately 50% of the time. [name] states she is doing 0 minutes 0 times per week.  Today's visit was #: 6 Starting weight: 241 lbs Starting date: 09/28/2016 Today's weight: 238 lbs Today's date: 12/02/2019 Total lbs lost to date: 3 Total lbs lost since last in-office visit: 0  Interim History: [name] did some celebration eating for her 49th birthday. She is frustrated with her weight gain, and she is trying to get back on track.  Subjective:   1. Insulin resistance [name] is struggling to decrease simple carbohydrates, and she is tolerating her medications well but stalling with her weight loss efforts.  Assessment/Plan:   1. Insulin resistance [name] was encouraged to get back on track with her eating plan and weight loss efforts, and ways to decrease simple carbohydrates to help decrease the risk of diabetes. [name] agreed to follow-up with [place] as directed to closely monitor her progress.  2. Class 2 severe obesity with serious comorbidity and body mass index (BMI) of 36.0 to 36.9 in adult, unspecified obesity type [name]) [name] is currently in the action stage of change. As such, her goal is to continue with weight loss efforts. She has agreed to keeping a food journal and adhering to recommended goals of 1200-1300 calories and 85 grams of protein daily.   Behavioral modification strategies: increasing lean protein intake.  [name] has agreed to follow-up with our clinic in 3 to 4 weeks. She was informed of the importance of frequent follow-up visits to maximize her success with intensive lifestyle modifications for her multiple health  conditions.   Objective:   Blood pressure 122/78, pulse 84, temperature 98.2 F (36.8 C), height 5\' 8"  (1.727 m), weight 238 lb (108 kg), SpO2 97 %. Body mass index is 36.19 kg/m.  General: Cooperative, alert, well developed, in no acute distress. HEENT: Conjunctivae and lids unremarkable. Cardiovascular: Regular rhythm.  Lungs: Normal work of breathing. Neurologic: No focal deficits.   Lab Results  Component Value Date   CREATININE 0.58 11/11/2019   BUN 20 11/11/2019   NA 141 11/11/2019   K 4.0 11/11/2019   CL 106 11/11/2019   CO2 23 11/11/2019   Lab Results  Component Value Date   ALT 19 11/11/2019   AST 20 11/11/2019   ALKPHOS 68 11/11/2019   BILITOT <0.2 11/11/2019   Lab Results  Component Value Date   HGBA1C 5.5 11/11/2019   HGBA1C 5.3 07/08/2019   HGBA1C 5.4 02/21/2019   HGBA1C 5.2 10/29/2018   HGBA1C 5.4 05/03/2018   Lab Results  Component Value Date   INSULIN 16.4 11/11/2019   INSULIN 17.3 02/21/2019   INSULIN 14.1 10/29/2018   INSULIN 17.8 05/03/2018   INSULIN 15.3 01/09/2018   Lab Results  Component Value Date   TSH 1.800 11/11/2019   Lab Results  Component Value Date   CHOL 152 11/11/2019   HDL 58 11/11/2019   LDLCALC 72 11/11/2019   TRIG 122 11/11/2019   CHOLHDL 3 08/16/2016   Lab Results  Component Value Date   WBC 5.8 11/11/2019   HGB 12.2 11/11/2019   HCT 37.2 11/11/2019   MCV 86 11/11/2019   PLT 246 11/11/2019  No results found for: IRON, TIBC, FERRITIN  Attestation Statements:   Reviewed by clinician on day of visit: allergies, medications, problem list, medical history, surgical history, family history, social history, and previous encounter notes.  Time spent on visit including pre-visit chart review and post-visit care and charting was 20 minutes.    I, Trixie Dredge, am acting as transcriptionist for Dennard Nip, MD.  I have reviewed the above documentation for accuracy and completeness, and I agree with the above.  -  Dennard Nip, MD

## 2019-12-05 ENCOUNTER — Other Ambulatory Visit (INDEPENDENT_AMBULATORY_CARE_PROVIDER_SITE_OTHER): Payer: Self-pay | Admitting: Family Medicine

## 2019-12-05 DIAGNOSIS — F3289 Other specified depressive episodes: Secondary | ICD-10-CM

## 2019-12-05 NOTE — Telephone Encounter (Signed)
Refill protocol sent to Dr Leafy Ro

## 2019-12-07 ENCOUNTER — Other Ambulatory Visit: Payer: Self-pay | Admitting: Internal Medicine

## 2019-12-19 ENCOUNTER — Other Ambulatory Visit (INDEPENDENT_AMBULATORY_CARE_PROVIDER_SITE_OTHER): Payer: Self-pay | Admitting: Family Medicine

## 2019-12-19 DIAGNOSIS — F3289 Other specified depressive episodes: Secondary | ICD-10-CM

## 2019-12-23 NOTE — Progress Notes (Signed)
Subjective:    Patient ID: [name], female    DOB: 1970-04-11, 49 y.o.   MRN: [id number]  HPI The patient is here for an acute visit.   Frequent falls:  She has had 4 falls since her back surgery ( June 1st), 3 in the past 2 weeks.  The last fall her right leg started to tingle when she was walking, her leg with shake or get wobbly, then later the leg just gave out on her and she fell forward.  It happened quickly and she could not have caught herself.   She has chronic lower back pain.  On the side of her spine she feels a weird sensation / kind of a pain  - this started after falls.  She has chronic pain in her leg and now on both sides.  Her right leg is continuously numb.  She does have an appointment at [facility] on 10/5 and wanted to see what else she should do to evaluate this further where she should go.  Medications and allergies reviewed with patient and updated if appropriate.  Patient Active Problem List   Diagnosis Date Noted  . Infected surgical wound 09/09/2019  . Allergic reaction 09/09/2019  . Dry cough 11/21/2017  . Rash and nonspecific skin eruption 11/21/2017  . Anxiety 08/17/2017  . 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 Other insomnia 06/06/2017  . Depression 05/16/2017  . Umbilical hernia 32/95/1884  . Diverticulosis of colon 02/26/2017  . Other hyperlipidemia 02/06/2017  . Asthma 11/24/2016  . Vitamin D deficiency 11/09/2016  . Class 1 obesity with serious comorbidity and body mass index (BMI) of 33.0 to 33.9 in adult 11/09/2016  . Right shoulder pain 10/11/2016  . AC (acromioclavicular) joint arthritis 09/13/2016  . Postsurgical menopause 08/16/2016  . Mucoid cyst of joint 11/04/2015  . External hemorrhoid 08/20/2015  . Prediabetes 06/05/2015  . GERD (gastroesophageal reflux disease) 06/04/2015  . Status post bilateral breast reduction 04/01/2015  . Cervical disc disorder with radiculopathy of cervical region 05/23/2014  . Ulnar neuropathy  01/16/2014  . Fibromyalgia 01/16/2014  . Recurrent nephrolithiasis 04/29/2013  . Arthralgia 04/29/2013  . Panic attacks 01/15/2013  . IBS (irritable bowel syndrome) 12/14/2011  . B12 deficiency 06/30/2009  . Essential hypertension 03/02/2007    Current Outpatient Medications on File Prior to Visit  Medication Sig Dispense Refill  . albuterol (VENTOLIN HFA) 108 (90 Base) MCG/ACT inhaler TAKE 2 PUFFS BY MOUTH EVERY 6 HOURS AS NEEDED FOR WHEEZE OR SHORTNESS OF BREATH 8.5 g 5  . aspirin 81 MG tablet Take 81 mg by mouth daily.     . blood glucose meter kit and supplies Dispense based on patient and insurance preference. Use up to two times daily as directed. (FOR ICD-10 E10.9, E11.9). 1 each 0  . Blood Glucose Monitoring Suppl (ONE TOUCH ULTRA 2) w/Device KIT OneTouch Ultra2 Meter    . buPROPion (WELLBUTRIN SR) 150 MG 12 hr tablet Take 1 tablet (150 mg total) by mouth daily. 30 tablet 0  . celecoxib (CELEBREX) 200 MG capsule Take 200 mg by mouth 2 (two) times daily.    . citalopram (CELEXA) 20 MG tablet TAKE 1 TABLET BY MOUTH EVERY DAY 90 tablet 0  . doxycycline (VIBRA-TABS) 100 MG tablet Take 1 tablet (100 mg total) by mouth 2 (two) times daily. 20 tablet 0  . estradiol (VIVELLE-DOT) 0.05 MG/24HR patch estradiol 0.05 mg/24 hr semiweekly transdermal patch    . famotidine (PEPCID) 20 MG tablet TAKE 1  TABLET BY MOUTH EVERYDAY AT BEDTIME 90 tablet 1  . Fluticasone-Salmeterol (ADVAIR DISKUS) 250-50 MCG/DOSE AEPB Inhale 1 puff into the lungs 2 (two) times daily. 1 each 3  . glucose blood (ONETOUCH ULTRA) test strip TEST BLOOD SUGARS ONCE A DAY 100 strip 0  . glucose blood test strip OneTouch Ultra Blue Test Strip  TEST BLOOD SUGARS ONCE A DAY    . hydrochlorothiazide (MICROZIDE) 12.5 MG capsule TAKE 1 CAPSULE BY MOUTH EVERY DAY 90 capsule 1  . hydroxychloroquine (PLAQUENIL) 200 MG tablet Take 200 mg by mouth 2 (two) times daily.    . Insulin Pen Needle (BD PEN NEEDLE NANO U/F) 32G X 4 MM MISC 1  each by Other route daily. 100 each 0  . Lancets (ONETOUCH DELICA PLUS [id number]) MISC OneTouch Delica Plus Lancet 30 gauge  USE AS INSTRUCTED TO CHECK SUGAR TWICE A DAY    . liraglutide (VICTOZA) 18 MG/3ML SOPN Inject 0.3 mLs (1.8 mg total) into the skin every morning. 0.3 mL 0  . lisinopril (ZESTRIL) 40 MG tablet TAKE 1 TABLET BY MOUTH EVERY DAY 90 tablet 1  . metFORMIN (GLUCOPHAGE) 500 MG tablet Take 1 tablet (500 mg total) by mouth 2 (two) times daily with a meal. 60 tablet 0  . montelukast (SINGULAIR) 10 MG tablet TAKE 1 TABLET BY MOUTH EVERYDAY AT BEDTIME 90 tablet 1  . omeprazole (PRILOSEC) 20 MG capsule TAKE 1 CAPSULE BY MOUTH EVERY DAY 90 capsule 1  . OneTouch Delica Lancets 28U MISC 1 each by Does not apply route 2 (two) times daily. 100 each 0  . topiramate (TOPAMAX) 50 MG tablet TAKE 1 TABLET BY MOUTH EVERYDAY AT BEDTIME 30 tablet 0  . Vitamin D, Ergocalciferol, (DRISDOL) 1.25 MG (50000 UNIT) CAPS capsule Take 1 capsule (50,000 Units total) by mouth every 7 (seven) days. 4 capsule 0   No current facility-administered medications on file prior to visit.    Past Medical History:  Diagnosis Date  . Anemia   . Anxiety   . Arthritis    inflammitory  . Asthma   . B12 deficiency   . Back pain   . Chronic joint pain   . Constipation   . DDD (degenerative disc disease), lumbar    low back and neck and shoulders  . Depression    during divorce & legal matters  . Fatty liver   . Fibromyalgia   . Gallbladder problem   . GERD (gastroesophageal reflux disease)   . History of kidney stones    Dr Phebe Colla  . History of polycystic ovarian disease S/P BSO  . Hx of anxiety disorder   . Hypertension    a  . Hypothyroidism    no meds  . IBS (irritable bowel syndrome)   . IBS (irritable bowel syndrome)   . Infertility, female   . Joint pain   . Kidney problem   . Lactose intolerance   . Lupus (HCC) hx positive ANA   followed by Dr Gavin Pound; possible lupus  . Multiple  food allergies   . Myalgia   . Osteoarthritis   . 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 POLYCYSTIC OVARIAN DISEASE 03/02/2007   Qualifier: Diagnosis of  By: Linna Darner MD, Rae Mar BSO for cysts & endometriosis; Dr Reino Kent, Gyn Seeing Dr Paula Compton    . PONV (postoperative nausea and vomiting)   . Prediabetes   . Sweating profusely   . Symptomatic mammary hypertrophy   . URI (upper respiratory infection)  currently on cefdinir    Past Surgical History:  Procedure Laterality Date  . [place]   exploratory lap  . BREAST REDUCTION SURGERY Bilateral 03/26/2015   Procedure: BILATERAL BREAST REDUCTION ;  Surgeon: Wallace Going, DO;  Location: [facility];  Service: Plastics;  Laterality: Bilateral;  . CARPAL TUNNEL RELEASE     bilateral; Dr Sherwood Gambler  . COLONOSCOPY     in 1990s  . CYSTO/ RIGHT RETROGRADE URETERAL PYELOGRAM  07-09-2004   HX BILATERAL RENAL STONES/ RIGHT FLANK PAIN  . CYSTOSCOPY W/ URETERAL STENT PLACEMENT  11/12/2011   Procedure: CYSTOSCOPY WITH RETROGRADE PYELOGRAM/URETERAL STENT PLACEMENT;  Surgeon: Alexis Frock, MD;  Location: [facility];  Service: Urology;  Laterality: Right;  . CYSTOSCOPY W/ URETERAL STENT REMOVAL  12/07/2011   Procedure: CYSTOSCOPY WITH STENT REMOVAL;  Surgeon: Alexis Frock, MD;  Location[facility];  Service: Urology;  Laterality: Right;  . CYSTOSCOPY/RETROGRADE/URETEROSCOPY/STONE EXTRACTION WITH BASKET  12/07/2011   Procedure: CYSTOSCOPY/RETROGRADE/URETEROSCOPY/STONE EXTRACTION WITH BASKET;  Surgeon: Alexis Frock, MD;  Location: [facility];  Service: Urology;  Laterality: Right;  . DILATION AND CURETTAGE OF UTERUS     X5  . EXCISION LEFT BARTHOLIN GLAND  02-05-2002  . EYE SURGERY     Retinal tears surgery bilateral  . LAPAROSCOPIC ASSISTED VAGINAL HYSTERECTOMY  2000  . LAPAROSCOPIC CHOLECYSTECTOMY  05-31-2007  . LAPAROSCOPIC LASER ABLATION  ENDOMETRIOSIS AND LEFT SALPINGO-OOPHORECTOMY  11-03-1999  . LAPAROSCOPY WITH RIGHT SALPINGO-OOPHECTOMY/ LYSIS ADHESIONS AND ABLATION ENDOMETRIOSIS  05-17-2001  . LIPOSUCTION Bilateral 03/26/2015   Procedure: WITH LIPOSUCTION;  Surgeon: Wallace Going, DO;  Location: [facility];  Service: Plastics;  Laterality: Bilateral;  . LUMBAR LAMINECTOMY  2003   L4 - L5  . PLANTAR FASCIA SURGERY     right  . PLANTAR FASCIA SURGERY  02/2011   left  . REDUCTION MAMMAPLASTY    . RIGHT URETEROSCOPIC STONE EXTRACTION  08-04-2000  . TONSILLECTOMY    . URETERAL STENT PLACEMENT  11/12/11   Dr Tresa Moore    Social History   Socioeconomic History  . Marital status: Married    Spouse name: Darnelle Maffucci  . Number of children: 0  . Years of education: college  . Highest education level: Not on file  Occupational History  . Occupation: disabled  Tobacco Use  . Smoking status: Never Smoker  . Smokeless tobacco: Never Used  Substance and Sexual Activity  . Alcohol use: Yes    Alcohol/week: 1.0 standard drink    Types: 1 Shots of liquor per week    Comment:  very rarely  . Drug use: No  . Sexual activity: Yes    Birth control/protection: Surgical  Other Topics Concern  . Not on file  Social History Narrative   Patient Lives at home with her husband Darnelle Maffucci)   Disabled.   Education two years of college.   Right handed.   Caffeine coffee and sweet tea. Not daily.   Social Determinants of Health   Financial Resource Strain:   . Difficulty of Paying Living Expenses: Not on file  Food Insecurity:   . Worried About Charity fundraiser in the Last Year: Not on file  . Ran Out of Food in the Last Year: Not on file  Transportation Needs:   . Lack of Transportation (Medical): Not on file  . Lack of Transportation (Non-Medical): Not on file  Physical Activity:   .  Days of Exercise per Week: Not on file  . Minutes of Exercise per Session: Not on file  Stress:   . Feeling of Stress : Not  on file  Social Connections:   . Frequency of Communication with Friends and Family: Not on file  . Frequency of Social Gatherings with Friends and Family: Not on file  . Attends Religious Services: Not on file  . Active Member of Clubs or Organizations: Not on file  . Attends Archivist Meetings: Not on file  . Marital Status: Not on file    Family History  Problem Relation Age of Onset  . Hypertension Mother   . Colon polyps Mother   . Atrial fibrillation Mother        Dr Angelena Form  . Hyperlipidemia Mother   . Heart disease Mother   . Anxiety disorder Mother   . Sleep apnea Mother   . Obesity Mother   . Heart attack Father 37  . Diabetes Father   . Hypertension Father   . Hyperlipidemia Father   . Heart disease Father   . Stroke Father   . Obesity Father   . Hypertension Sister   . Heart attack Paternal Grandmother 44  . Breast cancer Paternal Aunt   . Colon cancer Maternal Uncle   . Colon cancer Paternal Uncle   . Colon polyps Maternal Grandmother   . Stroke Maternal Grandmother 76  . Diabetes Paternal Grandfather   . Stroke Maternal Grandfather 37    Review of Systems  Musculoskeletal: Positive for arthralgias and back pain.  Neurological: Positive for weakness and numbness.       Objective:   Vitals:   12/24/19 1048  BP: 130/80  Pulse: 83  Temp: 99.5 F (37.5 C)  SpO2: 98%   BP Readings from Last 3 Encounters:  12/24/19 130/80  12/02/19 122/78  11/11/19 140/85   Wt Readings from Last 3 Encounters:  12/24/19 241 lb (109.3 kg)  12/02/19 238 lb (108 kg)  11/11/19 237 lb (107.5 kg)   Body mass index is 36.64 kg/m.   Physical Exam Constitutional:      General: She is not in acute distress.    Appearance: Normal appearance. She is not ill-appearing.  HENT:     Head: Normocephalic and atraumatic.  Musculoskeletal:     Right lower leg: No edema.     Left lower leg: No edema.  Skin:    General: Skin is warm and dry.  Neurological:      Mental Status: She is alert.            Assessment & Plan:    See Problem List for Assessment and Plan of chronic medical problems.    This visit occurred during the SARS-CoV-2 public health emergency.  Safety protocols were in place, including screening questions prior to the visit, additional usage of staff PPE, and extensive cleaning of exam room while observing appropriate contact time as indicated for disinfecting solutions.

## 2019-12-24 ENCOUNTER — Ambulatory Visit (INDEPENDENT_AMBULATORY_CARE_PROVIDER_SITE_OTHER): Payer: PPO | Admitting: Internal Medicine

## 2019-12-24 ENCOUNTER — Other Ambulatory Visit: Payer: Self-pay

## 2019-12-24 ENCOUNTER — Encounter: Payer: Self-pay | Admitting: Internal Medicine

## 2019-12-24 DIAGNOSIS — M5441 Lumbago with sciatica, right side: Secondary | ICD-10-CM | POA: Diagnosis not present

## 2019-12-24 DIAGNOSIS — G8929 Other chronic pain: Secondary | ICD-10-CM | POA: Insufficient documentation

## 2019-12-24 DIAGNOSIS — M5442 Lumbago with sciatica, left side: Secondary | ICD-10-CM | POA: Diagnosis not present

## 2019-12-24 NOTE — Assessment & Plan Note (Signed)
Chronic Has had 2 surgeries in the past-the most recent June 2021 Still having lower back pain and pain down the right leg.  She has numbness in the right leg. She has had 4 falls since her back surgery this past summer and she believes it is all related to the surgery Reviewed her symptoms and discussed options Agree with orthopedic evaluation-has an appointment 10/5

## 2019-12-24 NOTE — Patient Instructions (Signed)
See the orthopedic on the 10/5.   Try using a cane.

## 2019-12-30 ENCOUNTER — Encounter (INDEPENDENT_AMBULATORY_CARE_PROVIDER_SITE_OTHER): Payer: Self-pay | Admitting: Family Medicine

## 2019-12-30 ENCOUNTER — Ambulatory Visit (INDEPENDENT_AMBULATORY_CARE_PROVIDER_SITE_OTHER): Payer: PPO | Admitting: Family Medicine

## 2019-12-30 ENCOUNTER — Other Ambulatory Visit: Payer: Self-pay

## 2019-12-30 VITALS — BP 111/76 | HR 70 | Temp 98.6°F | Ht 68.0 in | Wt 238.0 lb

## 2019-12-30 DIAGNOSIS — E559 Vitamin D deficiency, unspecified: Secondary | ICD-10-CM

## 2019-12-30 DIAGNOSIS — Z6836 Body mass index (BMI) 36.0-36.9, adult: Secondary | ICD-10-CM | POA: Diagnosis not present

## 2019-12-30 DIAGNOSIS — F3289 Other specified depressive episodes: Secondary | ICD-10-CM | POA: Diagnosis not present

## 2019-12-30 DIAGNOSIS — E8881 Metabolic syndrome: Secondary | ICD-10-CM

## 2019-12-30 DIAGNOSIS — E66812 Obesity, class 2: Secondary | ICD-10-CM

## 2019-12-30 DIAGNOSIS — E88819 Insulin resistance, unspecified: Secondary | ICD-10-CM

## 2019-12-30 MED ORDER — VITAMIN D (ERGOCALCIFEROL) 1.25 MG (50000 UNIT) PO CAPS: 50000.0000 [IU] | ORAL_CAPSULE | ORAL | 0 refills | Status: DC

## 2019-12-30 MED ORDER — BUPROPION HCL ER (SR) 150 MG PO TB12: 150.0000 mg | ORAL_TABLET | Freq: Every day | ORAL | 0 refills | Status: DC

## 2019-12-30 MED ORDER — LIRAGLUTIDE 18 MG/3ML ~~LOC~~ SOPN: 1.8000 mg | PEN_INJECTOR | Freq: Every morning | SUBCUTANEOUS | 0 refills | Status: DC

## 2019-12-30 MED ORDER — METFORMIN HCL 500 MG PO TABS: 500.0000 mg | ORAL_TABLET | Freq: Two times a day (BID) | ORAL | 0 refills | Status: DC

## 2019-12-30 MED ORDER — TOPIRAMATE 50 MG PO TABS: 50.0000 mg | ORAL_TABLET | Freq: Every day | ORAL | 0 refills | Status: DC

## 2019-12-30 NOTE — Progress Notes (Signed)
Chief Complaint:   OBESITY [name] is here to discuss her progress with her obesity treatment plan along with follow-up of her obesity related diagnoses. [name] is on keeping a food journal and adhering to recommended goals of 1200-1300 calories and 85 grams of protein daily and states she is following her eating plan approximately 85% of the time. [name] states she is walking for 30-45 minutes 3-4 times per week.  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 She is struggling with her right leg going numb, and she has had multiple falls when walking or hiking. She is frustrated with her lack of weight loss.  Subjective:   1. Insulin resistance [name] is on Victoza and metformin. She wonders if she would do better on other GLP-1 medications.  2. Vitamin D deficiency [name] is stable on Vit D, and she denies nausea or vomiting.  3. Other depression with emotional eating [name] is stable on her medications, and she feels she is stable and controlling her emotional eating overall.  Assessment/Plan:   1. Insulin resistance [name] will continue to work on weight loss, diet, exercise, and decreasing simple carbohydrates to help decrease the risk of diabetes. We will refill Victoza and metformin for 1 month. [name] agreed to follow-up with [place] as directed to closely monitor her progress.  - liraglutide (VICTOZA) 18 MG/3ML SOPN; Inject 1.8 mg into the skin every morning.  Dispense: 0.3 mL; Refill: 0 - metFORMIN (GLUCOPHAGE) 500 MG tablet; Take 1 tablet (500 mg total) by mouth 2 (two) times daily with a meal.  Dispense: 60 tablet; Refill: 0  2. Vitamin D deficiency Low Vitamin D level contributes to fatigue and are associated with  obesity, breast, and colon cancer. We will refill prescription Vitamin D for 1 month. [name] will follow-up for routine testing of Vitamin D, at least 2-3 times per year to avoid over-replacement.  - Vitamin D, Ergocalciferol, (DRISDOL) 1.25 MG (50000 UNIT) CAPS capsule; Take 1 capsule (50,000 Units total) by mouth every 7 (seven) days.  Dispense: 4 capsule; Refill: 0  3. Other depression with emotional eating Behavior modification techniques were discussed today to help Chelsea deal with her emotional/non-hunger eating behaviors. We will refill Wellbutrin SR and Topamax for 1 month. Orders and follow up as documented in patient record.   - buPROPion (WELLBUTRIN SR) 150 MG 12 hr tablet; Take 1 tablet (150 mg total) by mouth daily.  Dispense: 30 tablet; Refill: 0 - topiramate (TOPAMAX) 50 MG tablet; Take 1 tablet (50 mg total) by mouth at bedtime.  Dispense: 30 tablet; Refill: 0  4. Class 2 severe obesity with serious comorbidity and body mass index (BMI) of 36.0 to 36.9 in adult, unspecified obesity type [facility]) [name] is currently in the action stage of change. As such, her goal is to continue with weight loss efforts. She has agreed to keeping a food journal and adhering to recommended goals of 1200-1500 calories and 90+ grams of protein daily.   We will plan to recheck RMR at her next visit as this may be decreased.  Exercise goals: As is.  Behavioral modification strategies: increasing lean protein intake.  [name] has agreed to follow-up with our clinic in 4 weeks. She was informed of the importance of frequent follow-up visits  to maximize her success with intensive lifestyle modifications for her multiple health conditions.   Objective:   Blood pressure 111/76, pulse 70, temperature 98.6 F (37 C), height 5\' 8"  (1.727 m), weight 238 lb (108 kg), SpO2 99 %. Body mass index is 36.19 kg/m.  General: Cooperative, alert, well developed, in no acute distress. HEENT: Conjunctivae and  lids unremarkable. Cardiovascular: Regular rhythm.  Lungs: Normal work of breathing. Neurologic: No focal deficits.   Lab Results  Component Value Date   CREATININE 0.58 11/11/2019   BUN 20 11/11/2019   NA 141 11/11/2019   K 4.0 11/11/2019   CL 106 11/11/2019   CO2 23 11/11/2019   Lab Results  Component Value Date   ALT 19 11/11/2019   AST 20 11/11/2019   ALKPHOS 68 11/11/2019   BILITOT <0.2 11/11/2019   Lab Results  Component Value Date   HGBA1C 5.5 11/11/2019   HGBA1C 5.3 07/08/2019   HGBA1C 5.4 02/21/2019   HGBA1C 5.2 10/29/2018   HGBA1C 5.4 05/03/2018   Lab Results  Component Value Date   INSULIN 16.4 11/11/2019   INSULIN 17.3 02/21/2019   INSULIN 14.1 10/29/2018   INSULIN 17.8 05/03/2018   INSULIN 15.3 01/09/2018   Lab Results  Component Value Date   TSH 1.800 11/11/2019   Lab Results  Component Value Date   CHOL 152 11/11/2019   HDL 58 11/11/2019   LDLCALC 72 11/11/2019   TRIG 122 11/11/2019   CHOLHDL 3 08/16/2016   Lab Results  Component Value Date   WBC 5.8 11/11/2019   HGB 12.2 11/11/2019   HCT 37.2 11/11/2019   MCV 86 11/11/2019   PLT 246 11/11/2019   No results found for: IRON, TIBC, FERRITIN  Obesity Behavioral Intervention:   Approximately 15 minutes were spent on the discussion below.  ASK: We discussed the diagnosis of obesity with [name] today and Rise agreed to give [place] permission to discuss obesity behavioral modification therapy today.  ASSESS: [name] has the diagnosis of obesity and her BMI today is 36.2. [name] is in the action stage of change.   ADVISE: [name] was educated on the multiple health risks of obesity as well as the benefit of weight loss to improve her health. She was advised of the need for long term treatment and the importance of lifestyle modifications to improve her current health and to decrease her risk of future health problems.  AGREE: Multiple dietary modification options and treatment  options were discussed and [name] agreed to follow the recommendations documented in the above note.  ARRANGE: [name] was educated on the importance of frequent visits to treat obesity as outlined per CMS and USPSTF guidelines and agreed to schedule her next follow up appointment today.  Attestation Statements:   Reviewed by clinician on day of visit: allergies, medications, problem list, medical history, surgical history, family history, social history, and previous encounter notes.   I, Trixie Dredge, am acting as transcriptionist for Dennard Nip, MD.  I have reviewed the above documentation for accuracy and completeness, and I agree with the above. -  Dennard Nip, MD

## 2020-01-01 DIAGNOSIS — Z20828 Contact with and (suspected) exposure to other viral communicable diseases: Secondary | ICD-10-CM | POA: Diagnosis not present

## 2020-01-19 ENCOUNTER — Other Ambulatory Visit (INDEPENDENT_AMBULATORY_CARE_PROVIDER_SITE_OTHER): Payer: Self-pay | Admitting: Family Medicine

## 2020-01-19 DIAGNOSIS — F3289 Other specified depressive episodes: Secondary | ICD-10-CM

## 2020-01-28 DIAGNOSIS — M25511 Pain in right shoulder: Secondary | ICD-10-CM | POA: Diagnosis not present

## 2020-01-28 DIAGNOSIS — M5416 Radiculopathy, lumbar region: Secondary | ICD-10-CM | POA: Diagnosis not present

## 2020-01-29 ENCOUNTER — Encounter (INDEPENDENT_AMBULATORY_CARE_PROVIDER_SITE_OTHER): Payer: Self-pay | Admitting: Family Medicine

## 2020-01-29 ENCOUNTER — Other Ambulatory Visit: Payer: Self-pay

## 2020-01-29 ENCOUNTER — Ambulatory Visit (INDEPENDENT_AMBULATORY_CARE_PROVIDER_SITE_OTHER): Payer: PPO | Admitting: Family Medicine

## 2020-01-29 VITALS — BP 120/77 | HR 76 | Temp 98.0°F | Ht 68.0 in | Wt 239.0 lb

## 2020-01-29 DIAGNOSIS — F3289 Other specified depressive episodes: Secondary | ICD-10-CM | POA: Diagnosis not present

## 2020-01-29 DIAGNOSIS — R0602 Shortness of breath: Secondary | ICD-10-CM | POA: Diagnosis not present

## 2020-01-29 DIAGNOSIS — E8881 Metabolic syndrome: Secondary | ICD-10-CM | POA: Diagnosis not present

## 2020-01-29 DIAGNOSIS — Z6836 Body mass index (BMI) 36.0-36.9, adult: Secondary | ICD-10-CM

## 2020-01-29 MED ORDER — LIRAGLUTIDE 18 MG/3ML ~~LOC~~ SOPN: 1.8000 mg | PEN_INJECTOR | Freq: Every morning | SUBCUTANEOUS | 0 refills | Status: DC

## 2020-01-29 MED ORDER — METFORMIN HCL 500 MG PO TABS: 500.0000 mg | ORAL_TABLET | Freq: Two times a day (BID) | ORAL | 0 refills | Status: DC

## 2020-01-29 MED ORDER — BUPROPION HCL ER (SR) 150 MG PO TB12: 150.0000 mg | ORAL_TABLET | Freq: Every day | ORAL | 0 refills | Status: DC

## 2020-01-30 NOTE — Progress Notes (Signed)
Chief Complaint:   OBESITY [name] is here to discuss her progress with her obesity treatment plan along with follow-up of her obesity related diagnoses. [name] is on keeping a food journal and adhering to recommended goals of 1200-1500 calories and 90+ grams of protein daily and states she is following her eating plan approximately 50% of the time. [name] states she is doing 0 minutes 0 times per week.  Today's visit was #: 1 Starting weight: 241 lbs Starting date: 09/28/2016 Today's weight: 239 lbs Today's date: 01/29/2020 Total lbs lost to date: 2 Total lbs lost since last in-office visit: 0  Interim History: [name] has been dealing with worsening back and shoulder pain, and both of her parents having [place]. She is frustrated that her weight is going in the wrong direction and is feeling a bit discouraged.  Subjective:   1. Insulin resistance [name] notes decreased polyphagia overall on her medications, but also notes increased carbohydrate cravings especially with increased simple carbohydrates. She is mindful of her food choices but struggling to lose weight again.  2. Shortness of breath on exertion [name]'s IC today actually increased suggesting her RMR is not worsening. She is unable to exercise as much due to back pain but walking doesn't hurt her much.  3. Other depression with emotional eating [name] has increased stress with her parent's health and notes increased carbohydrate cravings. She is frustrated with herself and working on ways to decrease emotional eating.  Assessment/Plan:   1. Insulin resistance [name] will continue to work on weight loss, exercise, and decreasing simple carbohydrates to help decrease the risk of diabetes. We will refill both Victoza and metformin for 1 month. [name] agreed to follow-up with [place] as directed to closely monitor her progress.  - liraglutide (VICTOZA) 18 MG/3ML SOPN; Inject 1.8 mg into the skin every  morning.  Dispense: 9 mL; Refill: 0 - metFORMIN (GLUCOPHAGE) 500 MG tablet; Take 1 tablet (500 mg total) by mouth 2 (two) times daily with a meal.  Dispense: 60 tablet; Refill: 0  2. Shortness of breath on exertion [name] will continue to work on weight loss and exercise reguarly with strengthening and cardio.  3. Other depression with emotional eating Behavior modification techniques were discussed today to help [name] deal with her emotional/non-hunger eating behaviors. We will refill Wellbutrin SR for 1 month. Orders and follow up as documented in patient record.   - buPROPion (WELLBUTRIN SR) 150 MG 12 hr tablet; Take 1 tablet (150 mg total) by mouth daily.  Dispense: 30 tablet; Refill: 0  4. Class 2 severe obesity with serious comorbidity and body mass index (BMI) of 36.0 to 36.9 in adult, unspecified obesity type [facility]) [name] is currently in the action stage of change. As such, her goal is to continue with weight loss efforts. She has agreed to keeping a food journal and adhering to recommended goals of 1600 calories and 100 grams of protein daily.   Exercise goals: [name] is to do 15 minutes of strengthening and cardio 5 times per week.  Behavioral modification strategies: increasing lean protein intake, meal planning and cooking strategies and keeping a strict food journal.  [name] has agreed to follow-up with our clinic in 3 to 4 weeks. She was informed of the importance of frequent follow-up visits to maximize her success with intensive lifestyle modifications for her multiple health conditions.   Objective:   Blood pressure 120/77, pulse 76, temperature 98 F (36.7 C), height 5\' 8"  (1.727 m), weight 239  lb (108.4 kg), SpO2 99 %. Body mass index is 36.34 kg/m.  General: Cooperative, alert, well developed, in no acute distress. HEENT: Conjunctivae and lids unremarkable. Cardiovascular: Regular rhythm.  Lungs: Normal work of breathing. Neurologic: No focal deficits.    Lab Results  Component Value Date   CREATININE 0.58 11/11/2019   BUN 20 11/11/2019   NA 141 11/11/2019   K 4.0 11/11/2019   CL 106 11/11/2019   CO2 23 11/11/2019   Lab Results  Component Value Date   ALT 19 11/11/2019   AST 20 11/11/2019   ALKPHOS 68 11/11/2019   BILITOT <0.2 11/11/2019   Lab Results  Component Value Date   HGBA1C 5.5 11/11/2019   HGBA1C 5.3 07/08/2019   HGBA1C 5.4 02/21/2019   HGBA1C 5.2 10/29/2018   HGBA1C 5.4 05/03/2018   Lab Results  Component Value Date   INSULIN 16.4 11/11/2019   INSULIN 17.3 02/21/2019   INSULIN 14.1 10/29/2018   INSULIN 17.8 05/03/2018   INSULIN 15.3 01/09/2018   Lab Results  Component Value Date   TSH 1.800 11/11/2019   Lab Results  Component Value Date   CHOL 152 11/11/2019   HDL 58 11/11/2019   LDLCALC 72 11/11/2019   TRIG 122 11/11/2019   CHOLHDL 3 08/16/2016   Lab Results  Component Value Date   WBC 5.8 11/11/2019   HGB 12.2 11/11/2019   HCT 37.2 11/11/2019   MCV 86 11/11/2019   PLT 246 11/11/2019   No results found for: IRON, TIBC, FERRITIN  Obesity Behavioral Intervention:   Approximately 15 minutes were spent on the discussion below.  ASK: We discussed the diagnosis of obesity with [name] today and [name] agreed to give [place] permission to discuss obesity behavioral modification therapy today.  ASSESS: [name] has the diagnosis of obesity and her BMI today is 36.35. [name] is in the action stage of change.   ADVISE: [name] was educated on the multiple health risks of obesity as well as the benefit of weight loss to improve her health. She was advised of the need for long term treatment and the importance of lifestyle modifications to improve her current health and to decrease her risk of future health problems.  AGREE: Multiple dietary modification options and treatment options were discussed and [name] agreed to follow the recommendations documented in the above  note.  ARRANGE: [name] was educated on the importance of frequent visits to treat obesity as outlined per CMS and USPSTF guidelines and agreed to schedule her next follow up appointment today.  Attestation Statements:   Reviewed by clinician on day of visit: allergies, medications, problem list, medical history, surgical history, family history, social history, and previous encounter notes.   I, Trixie Dredge, am acting as transcriptionist for Dennard Nip, MD.  I have reviewed the above documentation for accuracy and completeness, and I agree with the above. -  Dennard Nip, MD

## 2020-02-03 DIAGNOSIS — Z6836 Body mass index (BMI) 36.0-36.9, adult: Secondary | ICD-10-CM | POA: Diagnosis not present

## 2020-02-03 DIAGNOSIS — M159 Polyosteoarthritis, unspecified: Secondary | ICD-10-CM | POA: Diagnosis not present

## 2020-02-03 DIAGNOSIS — E669 Obesity, unspecified: Secondary | ICD-10-CM | POA: Diagnosis not present

## 2020-02-03 DIAGNOSIS — M064 Inflammatory polyarthropathy: Secondary | ICD-10-CM | POA: Diagnosis not present

## 2020-02-03 DIAGNOSIS — M2550 Pain in unspecified joint: Secondary | ICD-10-CM | POA: Diagnosis not present

## 2020-02-04 ENCOUNTER — Other Ambulatory Visit: Payer: Self-pay | Admitting: Internal Medicine

## 2020-02-06 ENCOUNTER — Encounter: Payer: Self-pay | Admitting: Internal Medicine

## 2020-02-06 MED ORDER — CITALOPRAM HYDROBROMIDE 20 MG PO TABS
20.0000 mg | ORAL_TABLET | Freq: Every day | ORAL | 1 refills | Status: DC
Start: 2020-02-06 — End: 2020-08-06

## 2020-02-11 DIAGNOSIS — M25511 Pain in right shoulder: Secondary | ICD-10-CM | POA: Diagnosis not present

## 2020-02-11 DIAGNOSIS — M5416 Radiculopathy, lumbar region: Secondary | ICD-10-CM | POA: Diagnosis not present

## 2020-02-18 DIAGNOSIS — M75120 Complete rotator cuff tear or rupture of unspecified shoulder, not specified as traumatic: Secondary | ICD-10-CM | POA: Diagnosis not present

## 2020-02-18 DIAGNOSIS — M48062 Spinal stenosis, lumbar region with neurogenic claudication: Secondary | ICD-10-CM | POA: Diagnosis not present

## 2020-02-18 DIAGNOSIS — M5416 Radiculopathy, lumbar region: Secondary | ICD-10-CM | POA: Diagnosis not present

## 2020-02-19 ENCOUNTER — Other Ambulatory Visit (INDEPENDENT_AMBULATORY_CARE_PROVIDER_SITE_OTHER): Payer: Self-pay | Admitting: Family Medicine

## 2020-02-19 ENCOUNTER — Other Ambulatory Visit: Payer: Self-pay

## 2020-02-19 ENCOUNTER — Encounter (INDEPENDENT_AMBULATORY_CARE_PROVIDER_SITE_OTHER): Payer: Self-pay | Admitting: Family Medicine

## 2020-02-19 ENCOUNTER — Ambulatory Visit (INDEPENDENT_AMBULATORY_CARE_PROVIDER_SITE_OTHER): Payer: PPO | Admitting: Family Medicine

## 2020-02-19 VITALS — BP 128/75 | HR 79 | Temp 98.1°F | Ht 68.0 in | Wt 239.0 lb

## 2020-02-19 DIAGNOSIS — F3289 Other specified depressive episodes: Secondary | ICD-10-CM

## 2020-02-19 DIAGNOSIS — Z6836 Body mass index (BMI) 36.0-36.9, adult: Secondary | ICD-10-CM

## 2020-02-19 DIAGNOSIS — F418 Other specified anxiety disorders: Secondary | ICD-10-CM

## 2020-02-20 NOTE — Progress Notes (Signed)
Chief Complaint:   OBESITY [name] is here to discuss her progress with her obesity treatment plan along with follow-up of her obesity related diagnoses. [name] is on keeping a food journal and adhering to recommended goals of 1600 calories and 100 grams of protein daily and states she is following her eating plan approximately 85% of the time. [name] states she is doing 0 minutes 0 times per week.  Today's visit was #: 21 Starting weight: 241 lbs Starting date: 09/28/2016 Today's weight: 239 lbs Today's date: 02/19/2020 Total lbs lost to date: 2 Total lbs lost since last in-office visit: 0  Interim History: [name] has had increased celebration eating situations, but additional stressors with her parents multiple health issues. She has had more back and shoulder health issues which has stressed her.  Subjective:   1. Depression with anxiety [name] is struggling with increased stress and she struggles at times to handle her anxiety especially. She is on multiple medications to help and she shows no signs of suicidal or homicidal ideas.  Assessment/Plan:   1. Depression with anxiety We discussed stress reduction techniques and how stress affects her weight. [name] will continue her medications, and will follow up as directed. Support was offered today. Orders and follow up as documented in patient record.   2. Class 2 severe obesity with serious comorbidity and body mass index (BMI) of 36.0 to 36.9 in adult, unspecified obesity type [name]) [name] is currently in the action stage of change. As such, her goal is to continue with weight loss efforts. She has agreed to keeping a food journal and adhering to recommended goals of 1600-1700 calories and 100 grams of protein daily.   Behavioral modification strategies: increasing lean protein intake, emotional eating strategies and holiday eating strategies .  [name] has agreed to follow-up with our clinic in 3 to 4 weeks.  She was informed of the importance of frequent follow-up visits to maximize her success with intensive lifestyle modifications for her multiple health conditions.   Objective:   Blood pressure 128/75, pulse 79, temperature 98.1 F (36.7 C), height 5\' 8"  (1.727 m), weight 239 lb (108.4 kg), SpO2 97 %. Body mass index is 36.34 kg/m.  General: Cooperative, alert, well developed, in no acute distress. HEENT: Conjunctivae and lids unremarkable. Cardiovascular: Regular rhythm.  Lungs: Normal work of breathing. Neurologic: No focal deficits.   Lab Results  Component Value Date   CREATININE 0.58 11/11/2019   BUN 20 11/11/2019   NA 141 11/11/2019   K 4.0 11/11/2019   CL 106 11/11/2019   CO2 23 11/11/2019   Lab Results  Component Value Date   ALT 19 11/11/2019   AST 20 11/11/2019   ALKPHOS 68 11/11/2019   BILITOT <0.2 11/11/2019   Lab Results  Component Value Date   HGBA1C 5.5 11/11/2019   HGBA1C 5.3 07/08/2019   HGBA1C 5.4 02/21/2019   HGBA1C 5.2 10/29/2018   HGBA1C 5.4 05/03/2018   Lab Results  Component Value Date   INSULIN 16.4 11/11/2019   INSULIN 17.3 02/21/2019   INSULIN 14.1 10/29/2018   INSULIN 17.8 05/03/2018   INSULIN 15.3 01/09/2018   Lab Results  Component Value Date   TSH 1.800 11/11/2019   Lab Results  Component Value Date   CHOL 152 11/11/2019   HDL 58 11/11/2019   LDLCALC 72 11/11/2019   TRIG 122 11/11/2019   CHOLHDL 3 08/16/2016   Lab Results  Component Value Date   WBC 5.8 11/11/2019  HGB 12.2 11/11/2019   HCT 37.2 11/11/2019   MCV 86 11/11/2019   PLT 246 11/11/2019   No results found for: IRON, TIBC, FERRITIN  Obesity Behavioral Intervention:   Approximately 15 minutes were spent on the discussion below.  ASK: We discussed the diagnosis of obesity with [name] today and Jazz agreed to give [place] permission to discuss obesity behavioral modification therapy today.  ASSESS: Aaliyan has the diagnosis of obesity and her BMI today  is 36.35. Rainey is in the action stage of change.   ADVISE: Jodene was educated on the multiple health risks of obesity as well as the benefit of weight loss to improve her health. She was advised of the need for long term treatment and the importance of lifestyle modifications to improve her current health and to decrease her risk of future health problems.  AGREE: Multiple dietary modification options and treatment options were discussed and Maverick agreed to follow the recommendations documented in the above note.  ARRANGE: Armanda was educated on the importance of frequent visits to treat obesity as outlined per CMS and USPSTF guidelines and agreed to schedule her next follow up appointment today.  Attestation Statements:   Reviewed by clinician on day of visit: allergies, medications, problem list, medical history, surgical history, family history, social history, and previous encounter notes.   I, Trixie Dredge, am acting as transcriptionist for Dennard Nip, MD.  I have reviewed the above documentation for accuracy and completeness, and I agree with the above. -  Dennard Nip, MD

## 2020-03-02 DIAGNOSIS — M5416 Radiculopathy, lumbar region: Secondary | ICD-10-CM | POA: Diagnosis not present

## 2020-03-14 ENCOUNTER — Encounter: Payer: Self-pay | Admitting: Internal Medicine

## 2020-03-17 ENCOUNTER — Encounter (INDEPENDENT_AMBULATORY_CARE_PROVIDER_SITE_OTHER): Payer: Self-pay | Admitting: Family Medicine

## 2020-03-17 ENCOUNTER — Other Ambulatory Visit: Payer: Self-pay

## 2020-03-17 ENCOUNTER — Ambulatory Visit (INDEPENDENT_AMBULATORY_CARE_PROVIDER_SITE_OTHER): Payer: PPO | Admitting: Family Medicine

## 2020-03-17 VITALS — BP 123/73 | HR 86 | Temp 98.4°F | Ht 68.0 in | Wt 239.0 lb

## 2020-03-17 DIAGNOSIS — E8881 Metabolic syndrome: Secondary | ICD-10-CM

## 2020-03-17 DIAGNOSIS — F3289 Other specified depressive episodes: Secondary | ICD-10-CM

## 2020-03-17 DIAGNOSIS — M75121 Complete rotator cuff tear or rupture of right shoulder, not specified as traumatic: Secondary | ICD-10-CM | POA: Diagnosis not present

## 2020-03-17 DIAGNOSIS — E559 Vitamin D deficiency, unspecified: Secondary | ICD-10-CM

## 2020-03-17 DIAGNOSIS — Z6836 Body mass index (BMI) 36.0-36.9, adult: Secondary | ICD-10-CM | POA: Diagnosis not present

## 2020-03-17 MED ORDER — BUPROPION HCL ER (SR) 150 MG PO TB12: 150.0000 mg | ORAL_TABLET | Freq: Every day | ORAL | 0 refills | Status: DC

## 2020-03-17 MED ORDER — VITAMIN D (ERGOCALCIFEROL) 1.25 MG (50000 UNIT) PO CAPS: 50000.0000 [IU] | ORAL_CAPSULE | ORAL | 0 refills | Status: DC

## 2020-03-17 MED ORDER — LIRAGLUTIDE 18 MG/3ML ~~LOC~~ SOPN: 1.8000 mg | PEN_INJECTOR | Freq: Every morning | SUBCUTANEOUS | 0 refills | Status: DC

## 2020-03-17 MED ORDER — METFORMIN HCL 500 MG PO TABS: 500.0000 mg | ORAL_TABLET | Freq: Two times a day (BID) | ORAL | 0 refills | Status: DC

## 2020-03-17 MED ORDER — TOPIRAMATE 50 MG PO TABS: 50.0000 mg | ORAL_TABLET | Freq: Every day | ORAL | 0 refills | Status: DC

## 2020-03-17 NOTE — Progress Notes (Signed)
Chief Complaint:   OBESITY [name] is here to discuss her progress with her obesity treatment plan along with follow-up of her obesity related diagnoses. [name] is on keeping a food journal and adhering to recommended goals of 1600-1700 calories and 100 grams of protein daily and states she is following her eating plan approximately 70% of the time. [name] states she is walking 6,000 steps 3 times per week.   Today's visit was #: 51 Starting weight: 241 lbs Starting date: 09/28/2016 Today's weight: 239 lbs Today's date: 03/17/2020 Total lbs lost to date: 2 Total lbs lost since last in-office visit: 0  Interim History: [name] has done well maintaining her weight. She is frustrated with herself that she is struggling to lose the weight she regained but she is still mindful of her food choices.  Subjective:   1. Vitamin D deficiency [name] is stable on Vit D, and she denies nausea or vomiting.  2. Insulin resistance [name] had an episode of hypoglycemia despite minimizing simple carbohydrates and increasing protein. She has done well on her medications otherwise. She denies nausea or vomiting.  3. Other depression with emotional eating [name]'s mood is stable on her medications. She still struggles with emotional eating at times, but improved.  Assessment/Plan:   1. Vitamin D deficiency Low Vitamin D level contributes to fatigue and are associated with obesity, breast, and colon cancer. We will refill prescription Vitamin D for 1 month. [name] will follow-up for routine testing of Vitamin D, at least 2-3 times per year to avoid over-replacement.  - Vitamin D, Ergocalciferol, (DRISDOL) 1.25 MG (50000 UNIT) CAPS capsule; Take 1 capsule (50,000 Units total) by mouth every 7 (seven) days.  Dispense: 4 capsule; Refill: 0  2. Insulin resistance [name] will continue to work on weight loss, exercise, and decreasing simple carbohydrates to help decrease the risk of  diabetes. We will refill both metformin and Victoza for 1 month. [name] agreed to follow-up with [place] as directed to closely monitor her progress.  - metFORMIN (GLUCOPHAGE) 500 MG tablet; Take 1 tablet (500 mg total) by mouth 2 (two) times daily with a meal.  Dispense: 60 tablet; Refill: 0 - liraglutide (VICTOZA) 18 MG/3ML SOPN; Inject 1.8 mg into the skin every morning.  Dispense: 9 mL; Refill: 0  3. Other depression with emotional eating Behavior modification techniques were discussed today to help [name] deal with her emotional/non-hunger eating behaviors. We will refill both Topamax and Wellbutrin SR for 1 month. Orders and follow up as documented in patient record.   - topiramate (TOPAMAX) 50 MG tablet; Take 1 tablet (50 mg total) by mouth at bedtime.  Dispense: 30 tablet; Refill: 0 - buPROPion (WELLBUTRIN SR) 150 MG 12 hr tablet; Take 1 tablet (150 mg total) by mouth daily.  Dispense: 30 tablet; Refill: 0  4. Class 2 severe obesity with serious comorbidity and body mass index (BMI) of 36.0 to 36.9 in adult, unspecified obesity type [facility]) [name] is currently in the action stage of change. As such, her goal is to continue with weight loss efforts. She has agreed to keeping a food journal and adhering to recommended goals of 1600-1700 calories and 100+ grams of protein daily.   Exercise goals: As is.  Behavioral modification strategies: meal planning and cooking strategies and holiday eating strategies .  [name] has agreed to follow-up with our clinic in 4 weeks. She was informed of the importance of frequent follow-up visits to maximize her success with intensive lifestyle modifications for  her multiple health conditions.   Objective:   Blood pressure 123/73, pulse 86, temperature 98.4 F (36.9 C), height 5\' 8"  (1.727 m), weight 239 lb (108.4 kg), SpO2 99 %. Body mass index is 36.34 kg/m.  General: Cooperative, alert, well developed, in no acute distress. HEENT: Conjunctivae and  lids unremarkable. Cardiovascular: Regular rhythm.  Lungs: Normal work of breathing. Neurologic: No focal deficits.   Lab Results  Component Value Date   CREATININE 0.58 11/11/2019   BUN 20 11/11/2019   NA 141 11/11/2019   K 4.0 11/11/2019   CL 106 11/11/2019   CO2 23 11/11/2019   Lab Results  Component Value Date   ALT 19 11/11/2019   AST 20 11/11/2019   ALKPHOS 68 11/11/2019   BILITOT <0.2 11/11/2019   Lab Results  Component Value Date   HGBA1C 5.5 11/11/2019   HGBA1C 5.3 07/08/2019   HGBA1C 5.4 02/21/2019   HGBA1C 5.2 10/29/2018   HGBA1C 5.4 05/03/2018   Lab Results  Component Value Date   INSULIN 16.4 11/11/2019   INSULIN 17.3 02/21/2019   INSULIN 14.1 10/29/2018   INSULIN 17.8 05/03/2018   INSULIN 15.3 01/09/2018   Lab Results  Component Value Date   TSH 1.800 11/11/2019   Lab Results  Component Value Date   CHOL 152 11/11/2019   HDL 58 11/11/2019   LDLCALC 72 11/11/2019   TRIG 122 11/11/2019   CHOLHDL 3 08/16/2016   Lab Results  Component Value Date   WBC 5.8 11/11/2019   HGB 12.2 11/11/2019   HCT 37.2 11/11/2019   MCV 86 11/11/2019   PLT 246 11/11/2019   No results found for: IRON, TIBC, FERRITIN  Obesity Behavioral Intervention:   Approximately 15 minutes were spent on the discussion below.  ASK: We discussed the diagnosis of obesity with [name] today and [name] agreed to give [place] permission to discuss obesity behavioral modification therapy today.  ASSESS: [name] has the diagnosis of obesity and her BMI today is 36.35. [name] is in the action stage of change.   ADVISE: [name] was educated on the multiple health risks of obesity as well as the benefit of weight loss to improve her health. She was advised of the need for long term treatment and the importance of lifestyle modifications to improve her current health and to decrease her risk of future health problems.  AGREE: Multiple dietary modification options and treatment  options were discussed and [name] agreed to follow the recommendations documented in the above note.  ARRANGE: [name] was educated on the importance of frequent visits to treat obesity as outlined per CMS and USPSTF guidelines and agreed to schedule her next follow up appointment today.  Attestation Statements:   Reviewed by clinician on day of visit: allergies, medications, problem list, medical history, surgical history, family history, social history, and previous encounter notes.   I, Trixie Dredge, am acting as transcriptionist for Dennard Nip, MD.  I have reviewed the above documentation for accuracy and completeness, and I agree with the above. -  Dennard Nip, MD

## 2020-04-01 ENCOUNTER — Encounter: Payer: Self-pay | Admitting: Internal Medicine

## 2020-04-06 ENCOUNTER — Encounter (INDEPENDENT_AMBULATORY_CARE_PROVIDER_SITE_OTHER): Payer: Self-pay

## 2020-04-06 ENCOUNTER — Other Ambulatory Visit (INDEPENDENT_AMBULATORY_CARE_PROVIDER_SITE_OTHER): Payer: Self-pay | Admitting: Family Medicine

## 2020-04-06 DIAGNOSIS — E559 Vitamin D deficiency, unspecified: Secondary | ICD-10-CM

## 2020-04-06 NOTE — Telephone Encounter (Signed)
MyChart message sent to pt to find out if they have enough medication to get them through until next appt.   

## 2020-04-11 ENCOUNTER — Other Ambulatory Visit (INDEPENDENT_AMBULATORY_CARE_PROVIDER_SITE_OTHER): Payer: Self-pay | Admitting: Family Medicine

## 2020-04-11 DIAGNOSIS — F3289 Other specified depressive episodes: Secondary | ICD-10-CM

## 2020-04-12 ENCOUNTER — Other Ambulatory Visit (INDEPENDENT_AMBULATORY_CARE_PROVIDER_SITE_OTHER): Payer: Self-pay | Admitting: Family Medicine

## 2020-04-12 DIAGNOSIS — E8881 Metabolic syndrome: Secondary | ICD-10-CM

## 2020-04-13 DIAGNOSIS — J019 Acute sinusitis, unspecified: Secondary | ICD-10-CM | POA: Diagnosis not present

## 2020-04-13 DIAGNOSIS — Z1152 Encounter for screening for COVID-19: Secondary | ICD-10-CM | POA: Diagnosis not present

## 2020-04-13 DIAGNOSIS — J340 Abscess, furuncle and carbuncle of nose: Secondary | ICD-10-CM | POA: Diagnosis not present

## 2020-04-13 NOTE — Telephone Encounter (Signed)
Last OV with Dr. Beasley 

## 2020-04-13 NOTE — Telephone Encounter (Signed)
Last Ov with Dr Beasley

## 2020-04-15 ENCOUNTER — Encounter (INDEPENDENT_AMBULATORY_CARE_PROVIDER_SITE_OTHER): Payer: Self-pay | Admitting: Family Medicine

## 2020-04-15 ENCOUNTER — Ambulatory Visit (INDEPENDENT_AMBULATORY_CARE_PROVIDER_SITE_OTHER): Payer: PPO | Admitting: Family Medicine

## 2020-04-15 ENCOUNTER — Other Ambulatory Visit: Payer: Self-pay

## 2020-04-15 VITALS — BP 133/82 | HR 80 | Temp 98.5°F | Ht 68.0 in | Wt 241.0 lb

## 2020-04-15 DIAGNOSIS — F3289 Other specified depressive episodes: Secondary | ICD-10-CM | POA: Diagnosis not present

## 2020-04-15 DIAGNOSIS — E66812 Obesity, class 2: Secondary | ICD-10-CM

## 2020-04-15 DIAGNOSIS — E8881 Metabolic syndrome: Secondary | ICD-10-CM | POA: Diagnosis not present

## 2020-04-15 DIAGNOSIS — E559 Vitamin D deficiency, unspecified: Secondary | ICD-10-CM | POA: Diagnosis not present

## 2020-04-15 DIAGNOSIS — E88819 Insulin resistance, unspecified: Secondary | ICD-10-CM

## 2020-04-15 DIAGNOSIS — Z9189 Other specified personal risk factors, not elsewhere classified: Secondary | ICD-10-CM | POA: Diagnosis not present

## 2020-04-15 MED ORDER — VITAMIN D (ERGOCALCIFEROL) 1.25 MG (50000 UNIT) PO CAPS: 50000.0000 [IU] | ORAL_CAPSULE | ORAL | 0 refills | Status: DC

## 2020-04-15 MED ORDER — TOPIRAMATE 50 MG PO TABS: 50.0000 mg | ORAL_TABLET | Freq: Every day | ORAL | 0 refills | Status: DC

## 2020-04-16 ENCOUNTER — Telehealth (INDEPENDENT_AMBULATORY_CARE_PROVIDER_SITE_OTHER): Payer: Self-pay

## 2020-04-19 NOTE — Progress Notes (Deleted)
Subjective:    Patient ID: [name], female    DOB: 1971/03/09, 49 y.o.   MRN: [id number]  HPI The patient is here for an acute visit.     Medications and allergies reviewed with patient and updated if appropriate.  Patient Active Problem List   Diagnosis Date Noted  . Insulin resistance 12/30/2019  . Chronic back pain 12/24/2019  . Infected surgical wound 09/09/2019  . Allergic reaction 09/09/2019  . Dry cough 11/21/2017  . Rash and nonspecific skin eruption 11/21/2017  . Anxiety 08/17/2017  . 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 Other insomnia 06/06/2017  . Depression 05/16/2017  . Umbilical hernia 65/46/5035  . Diverticulosis of colon 02/26/2017  . Other hyperlipidemia 02/06/2017  . Asthma 11/24/2016  . Vitamin D deficiency 11/09/2016  . Class 1 obesity with serious comorbidity and body mass index (BMI) of 33.0 to 33.9 in adult 11/09/2016  . Right shoulder pain 10/11/2016  . AC (acromioclavicular) joint arthritis 09/13/2016  . Postsurgical menopause 08/16/2016  . Mucoid cyst of joint 11/04/2015  . External hemorrhoid 08/20/2015  . Prediabetes 06/05/2015  . GERD (gastroesophageal reflux disease) 06/04/2015  . Status post bilateral breast reduction 04/01/2015  . Cervical disc disorder with radiculopathy of cervical region 05/23/2014  . Ulnar neuropathy 01/16/2014  . Fibromyalgia 01/16/2014  . Recurrent nephrolithiasis 04/29/2013  . Arthralgia 04/29/2013  . Panic attacks 01/15/2013  . IBS (irritable bowel syndrome) 12/14/2011  . B12 deficiency 06/30/2009  . Essential hypertension 03/02/2007    Current Outpatient Medications on File Prior to Visit  Medication Sig Dispense Refill  . albuterol (VENTOLIN HFA) 108 (90 Base) MCG/ACT inhaler TAKE 2 PUFFS BY MOUTH EVERY 6 HOURS AS NEEDED FOR WHEEZE OR SHORTNESS OF BREATH 8.5 g 5  . aspirin 81 MG tablet Take 81 mg by mouth daily.    . blood glucose meter kit and supplies Dispense based on patient and insurance  preference. Use up to two times daily as directed. (FOR ICD-10 E10.9, E11.9). 1 each 0  . Blood Glucose Monitoring Suppl (ONE TOUCH ULTRA 2) w/Device KIT OneTouch Ultra2 Meter    . buPROPion (WELLBUTRIN SR) 150 MG 12 hr tablet Take 1 tablet (150 mg total) by mouth daily. 30 tablet 0  . celecoxib (CELEBREX) 200 MG capsule Take 200 mg by mouth 2 (two) times daily.    . citalopram (CELEXA) 20 MG tablet Take 1 tablet (20 mg total) by mouth daily. 90 tablet 1  . estradiol (VIVELLE-DOT) 0.05 MG/24HR patch estradiol 0.05 mg/24 hr semiweekly transdermal patch    . famotidine (PEPCID) 20 MG tablet TAKE 1 TABLET BY MOUTH EVERYDAY AT BEDTIME 90 tablet 1  . Fluticasone-Salmeterol (ADVAIR DISKUS) 250-50 MCG/DOSE AEPB Inhale 1 puff into the lungs 2 (two) times daily. 1 each 3  . glucose blood (ONETOUCH ULTRA) test strip TEST BLOOD SUGARS ONCE A DAY 100 strip 0  . glucose blood test strip OneTouch Ultra Blue Test Strip  TEST BLOOD SUGARS ONCE A DAY    . hydrochlorothiazide (MICROZIDE) 12.5 MG capsule TAKE 1 CAPSULE BY MOUTH EVERY DAY 90 capsule 1  . hydroxychloroquine (PLAQUENIL) 200 MG tablet Take 200 mg by mouth 2 (two) times daily.    . Insulin Pen Needle (BD PEN NEEDLE NANO U/F) 32G X 4 MM MISC 1 each by Other route daily. 100 each 0  . Lancets (ONETOUCH DELICA PLUS [id number]) MISC OneTouch Delica Plus Lancet 30 gauge  USE AS INSTRUCTED TO CHECK SUGAR TWICE A DAY    .  liraglutide (VICTOZA) 18 MG/3ML SOPN Inject 1.8 mg into the skin every morning. 9 mL 0  . lisinopril (ZESTRIL) 40 MG tablet TAKE 1 TABLET BY MOUTH EVERY DAY 90 tablet 1  . metFORMIN (GLUCOPHAGE) 500 MG tablet Take 1 tablet (500 mg total) by mouth 2 (two) times daily with a meal. 60 tablet 0  . montelukast (SINGULAIR) 10 MG tablet TAKE 1 TABLET BY MOUTH EVERYDAY AT BEDTIME 90 tablet 1  . omeprazole (PRILOSEC) 20 MG capsule TAKE 1 CAPSULE BY MOUTH EVERY DAY 90 capsule 1  . OneTouch Delica Lancets 09B MISC 1 each by Does not apply route 2 (two)  times daily. 100 each 0  . topiramate (TOPAMAX) 50 MG tablet Take 1 tablet (50 mg total) by mouth at bedtime. 30 tablet 0  . Vitamin D, Ergocalciferol, (DRISDOL) 1.25 MG (50000 UNIT) CAPS capsule Take 1 capsule (50,000 Units total) by mouth every 7 (seven) days. 4 capsule 0   No current facility-administered medications on file prior to visit.    Past Medical History:  Diagnosis Date  . Anemia   . Anxiety   . Arthritis    inflammitory  . Asthma   . B12 deficiency   . Back pain   . Chronic joint pain   . Constipation   . DDD (degenerative disc disease), lumbar    low back and neck and shoulders  . Depression    during divorce & legal matters  . Fatty liver   . Fibromyalgia   . Gallbladder problem   . GERD (gastroesophageal reflux disease)   . History of kidney stones    Dr Phebe Colla  . History of polycystic ovarian disease S/P BSO  . Hx of anxiety disorder   . Hypertension    a  . Hypothyroidism    no meds  . IBS (irritable bowel syndrome)   . IBS (irritable bowel syndrome)   . Infertility, female   . Joint pain   . Kidney problem   . Lactose intolerance   . Lupus (HCC) hx positive ANA   followed by Dr Gavin Pound; possible lupus  . Multiple food allergies   . Myalgia   . Osteoarthritis   . 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 POLYCYSTIC OVARIAN DISEASE 03/02/2007   Qualifier: Diagnosis of  By: Linna Darner MD, Rae Mar BSO for cysts & endometriosis; Dr Reino Kent, Gyn Seeing Dr Paula Compton    . PONV (postoperative nausea and vomiting)   . Prediabetes   . Sweating profusely   . Symptomatic mammary hypertrophy   . URI (upper respiratory infection)    currently on cefdinir    Past Surgical History:  Procedure Laterality Date  . [place]   exploratory lap  . BREAST REDUCTION SURGERY Bilateral 03/26/2015   Procedure: BILATERAL BREAST REDUCTION ;  Surgeon: Wallace Going, DO;  Location: [facility];   Service: Plastics;  Laterality: Bilateral;  . CARPAL TUNNEL RELEASE     bilateral; Dr Sherwood Gambler  . COLONOSCOPY     in 1990s  . CYSTO/ RIGHT RETROGRADE URETERAL PYELOGRAM  07-09-2004   HX BILATERAL RENAL STONES/ RIGHT FLANK PAIN  . CYSTOSCOPY W/ URETERAL STENT PLACEMENT  11/12/2011   Procedure: CYSTOSCOPY WITH RETROGRADE PYELOGRAM/URETERAL STENT PLACEMENT;  Surgeon: Alexis Frock, MD;  Location: [facility];  Service: Urology;  Laterality: Right;  . CYSTOSCOPY W/ URETERAL STENT REMOVAL  12/07/2011   Procedure: CYSTOSCOPY WITH STENT REMOVAL;  Surgeon: Alexis Frock, MD;  Location: [facility];  Service: Urology;  Laterality: Right;  . CYSTOSCOPY/RETROGRADE/URETEROSCOPY/STONE EXTRACTION WITH BASKET  12/07/2011   Procedure: CYSTOSCOPY/RETROGRADE/URETEROSCOPY/STONE EXTRACTION WITH BASKET;  Surgeon: Alexis Frock, MD;  Location: [facility];  Service: Urology;  Laterality: Right;  . DILATION AND CURETTAGE OF UTERUS     X5  . EXCISION LEFT BARTHOLIN GLAND  02-05-2002  . EYE SURGERY     Retinal tears surgery bilateral  . LAPAROSCOPIC ASSISTED VAGINAL HYSTERECTOMY  2000  . LAPAROSCOPIC CHOLECYSTECTOMY  05-31-2007  . LAPAROSCOPIC LASER ABLATION ENDOMETRIOSIS AND LEFT SALPINGO-OOPHORECTOMY  11-03-1999  . LAPAROSCOPY WITH RIGHT SALPINGO-OOPHECTOMY/ LYSIS ADHESIONS AND ABLATION ENDOMETRIOSIS  05-17-2001  . LIPOSUCTION Bilateral 03/26/2015   Procedure: WITH LIPOSUCTION;  Surgeon: Wallace Going, DO;  Location: [facility];  Service: Plastics;  Laterality: Bilateral;  . LUMBAR LAMINECTOMY  2003   L4 - L5  . PLANTAR FASCIA SURGERY     right  . PLANTAR FASCIA SURGERY  02/2011   left  . REDUCTION MAMMAPLASTY    . RIGHT URETEROSCOPIC STONE EXTRACTION  08-04-2000  . TONSILLECTOMY    . URETERAL STENT PLACEMENT  11/12/11   Dr Tresa Moore    Social History   Socioeconomic History  . Marital status: Married    Spouse name: Darnelle Maffucci  . Number of children: 0  . Years  of education: college  . Highest education level: Not on file  Occupational History  . Occupation: disabled  Tobacco Use  . Smoking status: Never Smoker  . Smokeless tobacco: Never Used  Substance and Sexual Activity  . Alcohol use: Yes    Alcohol/week: 1.0 standard drink    Types: 1 Shots of liquor per week    Comment:  very rarely  . Drug use: No  . Sexual activity: Yes    Birth control/protection: Surgical  Other Topics Concern  . Not on file  Social History Narrative   Patient Lives at home with her husband Darnelle Maffucci)   Disabled.   Education two years of college.   Right handed.   Caffeine coffee and sweet tea. Not daily.   Social Determinants of Health   Financial Resource Strain: Not on file  Food Insecurity: Not on file  Transportation Needs: Not on file  Physical Activity: Not on file  Stress: Not on file  Social Connections: Not on file    Family History  Problem Relation Age of Onset  . Hypertension Mother   . Colon polyps Mother   . Atrial fibrillation Mother        Dr Angelena Form  . Hyperlipidemia Mother   . Heart disease Mother   . Anxiety disorder Mother   . Sleep apnea Mother   . Obesity Mother   . Heart attack Father 75  . Diabetes Father   . Hypertension Father   . Hyperlipidemia Father   . Heart disease Father   . Stroke Father   . Obesity Father   . Hypertension Sister   . Heart attack Paternal Grandmother 2  . Breast cancer Paternal Aunt   . Colon cancer Maternal Uncle   . Colon cancer Paternal Uncle   . Colon polyps Maternal Grandmother   . Stroke Maternal Grandmother 76  . Diabetes Paternal Grandfather   . Stroke Maternal Grandfather 37    Review of Systems     Objective:  There were no vitals filed for this visit. BP Readings from Last 3 Encounters:  04/15/20 133/82  03/17/20 123/73  02/19/20 128/75   Wt Readings from Last 3 Encounters:  04/15/20 241 lb (109.3 kg)  03/17/20 239 lb (108.4 kg)  02/19/20 239 lb (108.4 kg)    There is no height or weight on file to calculate BMI.   Physical Exam         Assessment & Plan:    See Problem List for Assessment and Plan of chronic medical problems.    This visit occurred during the SARS-CoV-2 public health emergency.  Safety protocols were in place, including screening questions prior to the visit, additional usage of staff PPE, and extensive cleaning of exam room while observing appropriate contact time as indicated for disinfecting solutions.

## 2020-04-20 ENCOUNTER — Ambulatory Visit: Payer: Self-pay | Admitting: Internal Medicine

## 2020-04-20 NOTE — Progress Notes (Signed)
Chief Complaint:   OBESITY [name] is here to discuss her progress with her obesity treatment plan along with follow-up of her obesity related diagnoses. [name] is on keeping a food journal and adhering to recommended goals of 1600-1700 calories and 100 g protein and states she is following her eating plan approximately 0% of the time. [name] states she is exercising 0 minutes 0 times per week.  Today's visit was #: 84 Starting weight: 241 lbs Starting date: 09/28/2016 Today's weight: 241 lbs Today's date: 04/15/2020 Total lbs lost to date: 0 Total lbs lost since last in-office visit: 0  Interim History: [name] is the daughter of [name], whom I also saw today. She has previously been seen by Dr. Leafy [name]. This is her first visit with me. [name] hasn't been on the plan at all. She states journaling is not working for her and she wants to go back on a plan. She also doesn't want to take "all these meds."   Plan: [name] is to discuss with Dr. Leafy [name] medication changes, as I told patient that I am not comfortable changing her entire treatment plan today and it's best to make 1-2 changes at a time. We will change her meal plan today.  Assessment/Plan:   1. Vitamin D deficiency [name]'s Vitamin D level was 37.1 on 11/11/2019. She is currently taking prescription vitamin D 50,000 IU each week. She denies nausea, vomiting or muscle weakness.  Ref. Range 11/11/2019 10:49  Vitamin D, 25-Hydroxy Latest Ref Range: 30.0 - 100.0 ng/mL 37.1   Plan: Refill Vit D for 1 month, as per below. Low Vitamin D level contributes to fatigue and are associated with obesity, breast, and colon cancer. She agrees to continue to take prescription Vitamin D @50 ,000 IU every week and will follow-up for routine testing of Vitamin D, at least 2-3 times per year to avoid over-replacement.  Refill- Vitamin D, Ergocalciferol, (DRISDOL) 1.25 MG (50000 UNIT) CAPS capsule; Take 1 capsule (50,000 Units total) by  mouth every 7 (seven) days.  Dispense: 4 capsule; Refill: 0  2. Other depression with emotional eating  [name] reports no concerns about mood. She asks what each medication is for and has several questions today. She is prescribed Topamax and Wellbutrin.  Plan: [name] denies a need for medication refills. Continue current treatment plan until follow up with Dr. Leafy [name]. Patient advised to start exercise daily. Emotional eating strategies discussed with patient.  Refill- topiramate (TOPAMAX) 50 MG tablet; Take 1 tablet (50 mg total) by mouth at bedtime.  Dispense: 30 tablet; Refill: 0  3.  Insulin Resistance [name] has a diagnosis of insulin resistance based on her elevated fasting insulin level >5. She continues to work on diet and exercise to decrease her risk of diabetes. [name] is prescribed Victoza and Metformin and is tolerating both well with no known side effects.  Lab Results  Component Value Date   INSULIN 16.4 11/11/2019   INSULIN 17.3 02/21/2019   INSULIN 14.1 10/29/2018   INSULIN 17.8 05/03/2018   INSULIN 15.3 01/09/2018   Lab Results  Component Value Date   HGBA1C 5.5 11/11/2019   Plan: [name] will continue to work on weight loss, exercise, and decreasing simple carbohydrates to help decrease the risk of diabetes. [name] agreed to follow-up with [place] as directed to closely monitor her progress. She denies need for refill today. Reviewed with patient what each medication does for her in terms of weight loss. Medication changes per Dr. Leafy [name] at next [place].  4. At risk for activity intolerance [name] was given approximately 15 minutes of exercise intolerance counseling today. She is 50 y.o. female and has risk factors exercise intolerance including obesity. We discussed intensive lifestyle modifications today with an emphasis on specific weight loss instructions and strategies. [name] will slowly increase activity as tolerated.  5. Class 2 severe obesity with  serious comorbidity and body mass index (BMI) of 36.0 to 36.9 in adult, unspecified obesity type [name]) [name] is currently in the action stage of change. As such, her goal is to continue with weight loss efforts. She has agreed to change to the Category 3 Plan.   Exercise goals: Start 15 minutes of walking 5 days a week.  Behavioral modification strategies: increasing lean protein intake, decreasing simple carbohydrates, no skipping meals, meal planning and cooking strategies, keeping healthy foods in the home and planning for success.  [name] has agreed to follow-up with our clinic in 2 weeks. She was informed of the importance of frequent follow-up visits to maximize her success with intensive lifestyle modifications for her multiple health conditions.   Objective:   Blood pressure 133/82, pulse 80, temperature 98.5 F (36.9 C), height 5\' 8"  (1.727 m), weight 241 lb (109.3 kg), SpO2 99 %. Body mass index is 36.64 kg/m.  General: Cooperative, alert, well developed, in no acute distress. HEENT: Conjunctivae and lids unremarkable. Cardiovascular: Regular rhythm.  Lungs: Normal work of breathing. Neurologic: No focal deficits.   Lab Results  Component Value Date   CREATININE 0.58 11/11/2019   BUN 20 11/11/2019   NA 141 11/11/2019   K 4.0 11/11/2019   CL 106 11/11/2019   CO2 23 11/11/2019   Lab Results  Component Value Date   ALT 19 11/11/2019   AST 20 11/11/2019   ALKPHOS 68 11/11/2019   BILITOT <0.2 11/11/2019   Lab Results  Component Value Date   HGBA1C 5.5 11/11/2019   HGBA1C 5.3 07/08/2019   HGBA1C 5.4 02/21/2019   HGBA1C 5.2 10/29/2018   HGBA1C 5.4 05/03/2018   Lab Results  Component Value Date   INSULIN 16.4 11/11/2019   INSULIN 17.3 02/21/2019   INSULIN 14.1 10/29/2018   INSULIN 17.8 05/03/2018   INSULIN 15.3 01/09/2018   Lab Results  Component Value Date   TSH 1.800 11/11/2019   Lab Results  Component Value Date   CHOL 152 11/11/2019   HDL 58  11/11/2019   LDLCALC 72 11/11/2019   TRIG 122 11/11/2019   CHOLHDL 3 08/16/2016   Lab Results  Component Value Date   WBC 5.8 11/11/2019   HGB 12.2 11/11/2019   HCT 37.2 11/11/2019   MCV 86 11/11/2019   PLT 246 11/11/2019    Attestation Statements:   Reviewed by clinician on day of visit: allergies, medications, problem list, medical history, surgical history, family history, social history, and previous encounter notes.  Coral Ceo, am acting as Location manager for [employer], DO.  I have reviewed the above documentation for accuracy and completeness, and I agree with the above. Marjory Sneddon, D.O.  The [place] was signed into law in 2016 which includes the topic of electronic health records.  This provides immediate access to information in MyChart.  This includes consultation notes, operative notes, office notes, lab results and pathology reports.  If you have any questions about what you read please let us know at your next visit so we can discuss your concerns and take corrective action if need be.  We  are right here with you.

## 2020-04-21 ENCOUNTER — Ambulatory Visit: Payer: Self-pay | Admitting: Internal Medicine

## 2020-04-21 NOTE — Progress Notes (Signed)
Subjective:    Patient ID: [name], female    DOB: 1970/06/19, 50 y.o.   MRN: [id number]  HPI The patient is here for an acute visit.  She went to urgent care 04/13/20.  She had frontal headache and significant nasal congestion, bloody nose.  She had a minimal dry cough.  She was diagnosed with acute sinusitis and abscess/furuncle in the nose.  She was prescribed Bactrim DS BID x 10 days.  She took it for 7 days and stopped it due to severe diarrhea and her tongue hurting.    She is still having significant sinus pressure and congestion.  Nothing seems to be helping.  She still sees an array of colors when she blows her nose and still has some blood in her mucus.  Medications and allergies reviewed with patient and updated if appropriate.  Patient Active Problem List   Diagnosis Date Noted  . Insulin resistance 12/30/2019  . Chronic back pain 12/24/2019  . Infected surgical wound 09/09/2019  . Allergic reaction 09/09/2019  . Dry cough 11/21/2017  . Rash and nonspecific skin eruption 11/21/2017  . Anxiety 08/17/2017  . 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 Other insomnia 06/06/2017  . Depression 05/16/2017  . Umbilical hernia 02/17/5207  . Diverticulosis of colon 02/26/2017  . Other hyperlipidemia 02/06/2017  . Asthma 11/24/2016  . Vitamin D deficiency 11/09/2016  . Class 1 obesity with serious comorbidity and body mass index (BMI) of 33.0 to 33.9 in adult 11/09/2016  . Right shoulder pain 10/11/2016  . AC (acromioclavicular) joint arthritis 09/13/2016  . Postsurgical menopause 08/16/2016  . Mucoid cyst of joint 11/04/2015  . External hemorrhoid 08/20/2015  . Prediabetes 06/05/2015  . GERD (gastroesophageal reflux disease) 06/04/2015  . Status post bilateral breast reduction 04/01/2015  . Cervical disc disorder with radiculopathy of cervical region 05/23/2014  . Ulnar neuropathy 01/16/2014  . Fibromyalgia 01/16/2014  . Recurrent nephrolithiasis 04/29/2013  . Arthralgia  04/29/2013  . Panic attacks 01/15/2013  . IBS (irritable bowel syndrome) 12/14/2011  . B12 deficiency 06/30/2009  . Essential hypertension 03/02/2007    Current Outpatient Medications on File Prior to Visit  Medication Sig Dispense Refill  . acetaminophen (TYLENOL) 650 MG CR tablet Take 1,300 mg by mouth every 8 (eight) hours as needed for pain.    [name] albuterol (VENTOLIN HFA) 108 (90 Base) MCG/ACT inhaler TAKE 2 PUFFS BY MOUTH EVERY 6 HOURS AS NEEDED FOR WHEEZE OR SHORTNESS OF BREATH (Patient taking differently: Inhale 2 puffs into the lungs every 6 (six) hours as needed for shortness of breath or wheezing.) 8.5 g 5  . aspirin 81 MG tablet Take 81 mg by mouth at bedtime.    . blood glucose meter kit and supplies Dispense based on patient and insurance preference. Use up to two times daily as directed. (FOR ICD-10 E10.9, E11.9). 1 each 0  . Blood Glucose Monitoring Suppl (ONE TOUCH ULTRA 2) w/Device KIT OneTouch Ultra2 Meter    . buPROPion (WELLBUTRIN SR) 150 MG 12 hr tablet Take 1 tablet (150 mg total) by mouth daily. 30 tablet 0  . celecoxib (CELEBREX) 200 MG capsule Take 200 mg by mouth 2 (two) times daily.    . citalopram (CELEXA) 20 MG tablet Take 1 tablet (20 mg total) by mouth daily. 90 tablet 1  . diphenhydramine-acetaminophen (TYLENOL PM) 25-500 MG TABS tablet Take 2 tablets by mouth at bedtime.    . famotidine (PEPCID) 20 MG tablet TAKE 1 TABLET BY MOUTH EVERYDAY AT BEDTIME (  Patient taking differently: Take 20 mg by mouth at bedtime.) 90 tablet 1  . glucose blood (ONETOUCH ULTRA) test strip TEST BLOOD SUGARS ONCE A DAY 100 strip 0  . glucose blood test strip OneTouch Ultra Blue Test Strip  TEST BLOOD SUGARS ONCE A DAY    . hydrochlorothiazide (MICROZIDE) 12.5 MG capsule TAKE 1 CAPSULE BY MOUTH EVERY DAY (Patient taking differently: Take 12.5 mg by mouth daily.) 90 capsule 1  . hydroxychloroquine (PLAQUENIL) 200 MG tablet Take 200 mg by mouth 2 (two) times daily.    . Insulin Pen  Needle (BD PEN NEEDLE NANO U/F) 32G X 4 MM MISC 1 each by Other route daily. 100 each 0  . Lancets (ONETOUCH DELICA PLUS [id number]) MISC OneTouch Delica Plus Lancet 30 gauge  USE AS INSTRUCTED TO CHECK SUGAR TWICE A DAY    . liraglutide (VICTOZA) 18 MG/3ML SOPN Inject 1.8 mg into the skin every morning. 9 mL 0  . lisinopril (ZESTRIL) 40 MG tablet TAKE 1 TABLET BY MOUTH EVERY DAY (Patient taking differently: Take 40 mg by mouth daily.) 90 tablet 1  . metFORMIN (GLUCOPHAGE) 500 MG tablet Take 1 tablet (500 mg total) by mouth 2 (two) times daily with a meal. 60 tablet 0  . montelukast (SINGULAIR) 10 MG tablet TAKE 1 TABLET BY MOUTH EVERYDAY AT BEDTIME (Patient taking differently: Take 10 mg by mouth at bedtime.) 90 tablet 1  . omeprazole (PRILOSEC) 20 MG capsule TAKE 1 CAPSULE BY MOUTH EVERY DAY (Patient taking differently: Take 20 mg by mouth daily.) 90 capsule 1  . OneTouch Delica Lancets 23F MISC 1 each by Does not apply route 2 (two) times daily. 100 each 0  . topiramate (TOPAMAX) 50 MG tablet Take 1 tablet (50 mg total) by mouth at bedtime. 30 tablet 0  . Vitamin D, Ergocalciferol, (DRISDOL) 1.25 MG (50000 UNIT) CAPS capsule Take 1 capsule (50,000 Units total) by mouth every 7 (seven) days. (Patient taking differently: Take 50,000 Units by mouth every Thursday.) 4 capsule 0   No current facility-administered medications on file prior to visit.    Past Medical History:  Diagnosis Date  . Anemia   . Anxiety   . Arthritis    inflammitory  . Asthma   . B12 deficiency   . Back pain   . Chronic joint pain   . Constipation   . DDD (degenerative disc disease), lumbar    low back and neck and shoulders  . Depression    during divorce & legal matters  . Fatty liver   . Fibromyalgia   . Gallbladder problem   . GERD (gastroesophageal reflux disease)   . History of kidney stones    Dr Phebe Colla  . History of polycystic ovarian disease S/P BSO  . Hx of anxiety disorder   . Hypertension     a  . Hypothyroidism    no meds  . IBS (irritable bowel syndrome)   . IBS (irritable bowel syndrome)   . Infertility, female   . Joint pain   . Kidney problem   . Lactose intolerance   . Lupus (HCC) hx positive ANA   followed by Dr Gavin Pound; possible lupus  . Multiple food allergies   . Myalgia   . Osteoarthritis   . 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 POLYCYSTIC OVARIAN DISEASE 03/02/2007   Qualifier: Diagnosis of  By: Linna Darner MD, Rae Mar BSO for cysts & endometriosis; Dr Reino Kent, Gyn Seeing Dr Paula Compton    .  PONV (postoperative nausea and vomiting)   . Prediabetes   . Sweating profusely   . Symptomatic mammary hypertrophy   . URI (upper respiratory infection)    currently on cefdinir    Past Surgical History:  Procedure Laterality Date  . [place]   exploratory lap  . BREAST REDUCTION SURGERY Bilateral 03/26/2015   Procedure: BILATERAL BREAST REDUCTION ;  Surgeon: Wallace Going, DO;  Location: [place];  Service: Plastics;  Laterality: Bilateral;  . CARPAL TUNNEL RELEASE     bilateral; Dr Sherwood Gambler  . COLONOSCOPY     in 1990s  . CYSTO/ RIGHT RETROGRADE URETERAL PYELOGRAM  07-09-2004   HX BILATERAL RENAL STONES/ RIGHT FLANK PAIN  . CYSTOSCOPY W/ URETERAL STENT PLACEMENT  11/12/2011   Procedure: CYSTOSCOPY WITH RETROGRADE PYELOGRAM/URETERAL STENT PLACEMENT;  Surgeon: Alexis Frock, MD;  Location: [facility];  Service: Urology;  Laterality: Right;  . CYSTOSCOPY W/ URETERAL STENT REMOVAL  12/07/2011   Procedure: CYSTOSCOPY WITH STENT REMOVAL;  Surgeon: Alexis Frock, MD;  Location: [facility];  Service: Urology;  Laterality: Right;  . CYSTOSCOPY/RETROGRADE/URETEROSCOPY/STONE EXTRACTION WITH BASKET  12/07/2011   Procedure: CYSTOSCOPY/RETROGRADE/URETEROSCOPY/STONE EXTRACTION WITH BASKET;  Surgeon: Alexis Frock, MD;  [facility];  Service: Urology;  Laterality: Right;   . DILATION AND CURETTAGE OF UTERUS     X5  . EXCISION LEFT BARTHOLIN GLAND  02-05-2002  . EYE SURGERY     Retinal tears surgery bilateral  . LAPAROSCOPIC ASSISTED VAGINAL HYSTERECTOMY  2000  . LAPAROSCOPIC CHOLECYSTECTOMY  05-31-2007  . LAPAROSCOPIC LASER ABLATION ENDOMETRIOSIS AND LEFT SALPINGO-OOPHORECTOMY  11-03-1999  . LAPAROSCOPY WITH RIGHT SALPINGO-OOPHECTOMY/ LYSIS ADHESIONS AND ABLATION ENDOMETRIOSIS  05-17-2001  . LIPOSUCTION Bilateral 03/26/2015   Procedure: WITH LIPOSUCTION;  Surgeon: Wallace Going, DO;  Location: [place];  Service: Plastics;  Laterality: Bilateral;  . LUMBAR LAMINECTOMY  2003   L4 - L5  . PLANTAR FASCIA SURGERY     right  . PLANTAR FASCIA SURGERY  02/2011   left  . REDUCTION MAMMAPLASTY    . RIGHT URETEROSCOPIC STONE EXTRACTION  08-04-2000  . TONSILLECTOMY    . URETERAL STENT PLACEMENT  11/12/11   Dr Tresa Moore    Social History   Socioeconomic History  . Marital status: Married    Spouse name: Darnelle Maffucci  . Number of children: 0  . Years of education: college  . Highest education level: Not on file  Occupational History  . Occupation: disabled  Tobacco Use  . Smoking status: Never Smoker  . Smokeless tobacco: Never Used  Substance and Sexual Activity  . Alcohol use: Yes    Alcohol/week: 1.0 standard drink    Types: 1 Shots of liquor per week    Comment:  very rarely  . Drug use: No  . Sexual activity: Yes    Birth control/protection: Surgical  Other Topics Concern  . Not on file  Social History Narrative   Patient Lives at home with her husband Darnelle Maffucci)   Disabled.   Education two years of college.   Right handed.   Caffeine coffee and sweet tea. Not daily.   Social Determinants of Health   Financial Resource Strain: Not on file  Food Insecurity: Not on file  Transportation Needs: Not on file  Physical Activity: Not on file  Stress: Not on file  Social Connections: Not on file    Family History  Problem  Relation  Age of Onset  . Hypertension Mother   . Colon polyps Mother   . Atrial fibrillation Mother        Dr Angelena Form  . Hyperlipidemia Mother   . Heart disease Mother   . Anxiety disorder Mother   . Sleep apnea Mother   . Obesity Mother   . Heart attack Father 13  . Diabetes Father   . Hypertension Father   . Hyperlipidemia Father   . Heart disease Father   . Stroke Father   . Obesity Father   . Hypertension Sister   . Heart attack Paternal Grandmother 41  . Breast cancer Paternal Aunt   . Colon cancer Maternal Uncle   . Colon cancer Paternal Uncle   . Colon polyps Maternal Grandmother   . Stroke Maternal Grandmother 76  . Diabetes Paternal Grandfather   . Stroke Maternal Grandfather 37    Review of Systems  Constitutional: Negative for chills and fever.  HENT: Positive for congestion (clear, yellow, green, blood), nosebleeds and sinus pain. Negative for ear pain and sore throat.   Respiratory: Negative for cough, shortness of breath and wheezing.   Neurological: Positive for headaches (intermittent at bridge of nose).       Objective:   Vitals:   04/22/20 1343  BP: 118/70  Pulse: 80  Temp: 98.3 F (36.8 C)  SpO2: 98%   BP Readings from Last 3 Encounters:  04/22/20 118/70  04/15/20 133/82  03/17/20 123/73   Wt Readings from Last 3 Encounters:  04/22/20 244 lb (110.7 kg)  04/15/20 241 lb (109.3 kg)  03/17/20 239 lb (108.4 kg)   Body mass index is 37.1 kg/m.   Physical Exam    GENERAL APPEARANCE: Appears stated age, well appearing, NAD EYES: conjunctiva clear, no icterus HEENT: bilateral tympanic membranes and ear canals normal, oropharynx with no erythema, tongue with mild white coating, no thyromegaly, trachea midline, no cervical or supraclavicular lymphadenopathy LUNGS: Clear to auscultation without wheeze or crackles, unlabored breathing, good air entry bilaterally CARDIOVASCULAR: Normal S1,S2 without murmurs, no edema SKIN: Warm, dry       Assessment & Plan:    See Problem List for Assessment and Plan of chronic medical problems.    This visit occurred during the SARS-CoV-2 public health emergency.  Safety protocols were in place, including screening questions prior to the visit, additional usage of staff PPE, and extensive cleaning of exam room while observing appropriate contact time as indicated for disinfecting solutions.

## 2020-04-22 ENCOUNTER — Other Ambulatory Visit: Payer: Self-pay

## 2020-04-22 ENCOUNTER — Ambulatory Visit (INDEPENDENT_AMBULATORY_CARE_PROVIDER_SITE_OTHER): Payer: PPO | Admitting: Internal Medicine

## 2020-04-22 ENCOUNTER — Encounter: Payer: Self-pay | Admitting: Internal Medicine

## 2020-04-22 DIAGNOSIS — J0100 Acute maxillary sinusitis, unspecified: Secondary | ICD-10-CM

## 2020-04-22 DIAGNOSIS — J019 Acute sinusitis, unspecified: Secondary | ICD-10-CM | POA: Insufficient documentation

## 2020-04-22 DIAGNOSIS — B370 Candidal stomatitis: Secondary | ICD-10-CM | POA: Insufficient documentation

## 2020-04-22 MED ORDER — CEFDINIR 300 MG PO CAPS: 300.0000 mg | ORAL_CAPSULE | Freq: Two times a day (BID) | ORAL | 0 refills | Status: DC

## 2020-04-22 MED ORDER — NYSTATIN 100000 UNIT/ML MT SUSP: 5.0000 mL | Freq: Four times a day (QID) | OROMUCOSAL | 1 refills | Status: DC

## 2020-04-22 MED ORDER — METHYLPREDNISOLONE 4 MG PO TBPK: ORAL_TABLET | ORAL | 0 refills | Status: DC

## 2020-04-22 NOTE — Assessment & Plan Note (Signed)
Acute Tongue with white coating and symptoms consistent with thrush-related to recent antibiotics Start nystatin swish and spit x10 days-refill given since we are putting her on additional antibiotics and steroids Call if no improvement

## 2020-04-22 NOTE — Assessment & Plan Note (Addendum)
Acute Likely bacterial Took Bactrim DS x1 week-total prescription length was 10 days, but she developed severe diarrhea and probable thrush.  Antibiotic helped some, but still with significant symptoms Start cefdinir 300 mg BID x 10 day Medrol Dosepak for significant sinus pressure and congestion otc cold medications Start saline nasal spray, Mucinex Rest, fluid Call if no improvement

## 2020-04-22 NOTE — Patient Instructions (Signed)
Take the antibiotic as prescribed - complete the entire course. Take the steroid pak.   Start using saline nasal spray.  Continue over the counter cold medication, advil and tylenol.  Increase your fluids and rest.    Collie Siad the mouth rinse for the thrush.  Refill it if needed.     Call if no improvement       Sinusitis, Adult Sinusitis is inflammation of your sinuses. Sinuses are hollow spaces in the bones around your face. Your sinuses are located:  Around your eyes.  In the middle of your forehead.  Behind your nose.  In your cheekbones. Mucus normally drains out of your sinuses. When your nasal tissues become inflamed or swollen, mucus can become trapped or blocked. This allows bacteria, viruses, and fungi to grow, which leads to infection. Most infections of the sinuses are caused by a virus. Sinusitis can develop quickly. It can last for up to 4 weeks (acute) or for more than 12 weeks (chronic). Sinusitis often develops after a cold. What are the causes? This condition is caused by anything that creates swelling in the sinuses or stops mucus from draining. This includes:  Allergies.  Asthma.  Infection from bacteria or viruses.  Deformities or blockages in your nose or sinuses.  Abnormal growths in the nose (nasal polyps).  Pollutants, such as chemicals or irritants in the air.  Infection from fungi (rare). What increases the risk? You are more likely to develop this condition if you:  Have a weak body defense system (immune system).  Do a lot of swimming or diving.  Overuse nasal sprays.  Smoke. What are the signs or symptoms? The main symptoms of this condition are pain and a feeling of pressure around the affected sinuses. Other symptoms include:  Stuffy nose or congestion.  Thick drainage from your nose.  Swelling and warmth over the affected sinuses.  Headache.  Upper toothache.  A cough that may get worse at night.  Extra mucus that collects  in the throat or the back of the nose (postnasal drip).  Decreased sense of smell and taste.  Fatigue.  A fever.  Sore throat.  Bad breath. How is this diagnosed? This condition is diagnosed based on:  Your symptoms.  Your medical history.  A physical exam.  Tests to find out if your condition is acute or chronic. This may include: ? Checking your nose for nasal polyps. ? Viewing your sinuses using a device that has a light (endoscope). ? Testing for allergies or bacteria. ? Imaging tests, such as an MRI or CT scan. In rare cases, a bone biopsy may be done to rule out more serious types of fungal sinus disease. How is this treated? Treatment for sinusitis depends on the cause and whether your condition is chronic or acute.  If caused by a virus, your symptoms should go away on their own within 10 days. You may be given medicines to relieve symptoms. They include: ? Medicines that shrink swollen nasal passages (topical intranasal decongestants). ? Medicines that treat allergies (antihistamines). ? A spray that eases inflammation of the nostrils (topical intranasal corticosteroids). ? Rinses that help get rid of thick mucus in your nose (nasal saline washes).  If caused by bacteria, your health care provider may recommend waiting to see if your symptoms improve. Most bacterial infections will get better without antibiotic medicine. You may be given antibiotics if you have: ? A severe infection. ? A weak immune system.  If caused by narrow  nasal passages or nasal polyps, you may need to have surgery. Follow these instructions at home: Medicines  Take, use, or apply over-the-counter and prescription medicines only as told by your health care provider. These may include nasal sprays.  If you were prescribed an antibiotic medicine, take it as told by your health care provider. Do not stop taking the antibiotic even if you start to feel better. Hydrate and humidify  Drink  enough fluid to keep your urine pale yellow. Staying hydrated will help to thin your mucus.  Use a cool mist humidifier to keep the humidity level in your home above 50%.  Inhale steam for 10-15 minutes, 3-4 times a day, or as told by your health care provider. You can do this in the bathroom while a hot shower is running.  Limit your exposure to cool or dry air.   Rest  Rest as much as possible.  Sleep with your head raised (elevated).  Make sure you get enough sleep each night. General instructions  Apply a warm, moist washcloth to your face 3-4 times a day or as told by your health care provider. This will help with discomfort.  Wash your hands often with soap and water to reduce your exposure to germs. If soap and water are not available, use hand sanitizer.  Do not smoke. Avoid being around people who are smoking (secondhand smoke).  Keep all follow-up visits as told by your health care provider. This is important.   Contact a health care provider if:  You have a fever.  Your symptoms get worse.  Your symptoms do not improve within 10 days. Get help right away if:  You have a severe headache.  You have persistent vomiting.  You have severe pain or swelling around your face or eyes.  You have vision problems.  You develop confusion.  Your neck is stiff.  You have trouble breathing. Summary  Sinusitis is soreness and inflammation of your sinuses. Sinuses are hollow spaces in the bones around your face.  This condition is caused by nasal tissues that become inflamed or swollen. The swelling traps or blocks the flow of mucus. This allows bacteria, viruses, and fungi to grow, which leads to infection.  If you were prescribed an antibiotic medicine, take it as told by your health care provider. Do not stop taking the antibiotic even if you start to feel better.  Keep all follow-up visits as told by your health care provider. This is important. This information is  not intended to replace advice given to you by your health care provider. Make sure you discuss any questions you have with your health care provider. Document Revised: 08/21/2017 Document Reviewed: 08/21/2017 Elsevier Patient Education  2021 [employer].

## 2020-04-27 DIAGNOSIS — Z01818 Encounter for other preprocedural examination: Secondary | ICD-10-CM | POA: Insufficient documentation

## 2020-04-27 NOTE — Progress Notes (Signed)
Subjective:    Patient ID: [name], female    DOB: Dec 20, 1970, 50 y.o.   MRN: [id number]   This visit occurred during the SARS-CoV-2 public health emergency.  Safety protocols were in place, including screening questions prior to the visit, additional usage of staff PPE, and extensive cleaning of exam room while observing appropriate contact time as indicated for disinfecting solutions.    HPI She is here for pre-operative clearance at the request of Dr Kurtis Bushman for Right shoulder arthroscopy with rotator cuff repair under local anesthesia with block scheduled for 05/11/2020.  She is also here for follow-up of her chronic medical problems.  She denies any personal or family history of problems with anesthesia or bleeding/blood clot problems.    She has no concerns.  She is taking all her medication as prescribed.     With her daily activities she denies chest pain, palpitations, SOB and lightheadedness.        Medications and allergies reviewed with patient and updated if appropriate.  Patient Active Problem List   Diagnosis Date Noted  . Preoperative clearance 04/27/2020  . Acute sinus infection 04/22/2020  . Thrush 04/22/2020  . Insulin resistance 12/30/2019  . Chronic back pain 12/24/2019  . Infected surgical wound 09/09/2019  . Allergic reaction 09/09/2019  . Dry cough 11/21/2017  . Rash and nonspecific skin eruption 11/21/2017  . Anxiety 08/17/2017  . 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 Other insomnia 06/06/2017  . Depression 05/16/2017  . Umbilical hernia 78/93/8101  . Diverticulosis of colon 02/26/2017  . Other hyperlipidemia 02/06/2017  . Asthma 11/24/2016  . Vitamin D deficiency 11/09/2016  . Class 1 obesity with serious comorbidity and body mass index (BMI) of 33.0 to 33.9 in adult 11/09/2016  . Right shoulder pain 10/11/2016  . AC (acromioclavicular) joint arthritis 09/13/2016  . Postsurgical menopause 08/16/2016  . Mucoid cyst of joint 11/04/2015  .  External hemorrhoid 08/20/2015  . Prediabetes 06/05/2015  . GERD (gastroesophageal reflux disease) 06/04/2015  . Status post bilateral breast reduction 04/01/2015  . Cervical disc disorder with radiculopathy of cervical region 05/23/2014  . Ulnar neuropathy 01/16/2014  . Fibromyalgia 01/16/2014  . Recurrent nephrolithiasis 04/29/2013  . Arthralgia 04/29/2013  . Panic attacks 01/15/2013  . IBS (irritable bowel syndrome) 12/14/2011  . B12 deficiency 06/30/2009  . Essential hypertension 03/02/2007    Current Outpatient Medications on File Prior to Visit  Medication Sig Dispense Refill  . acetaminophen (TYLENOL) 650 MG CR tablet Take 1,300 mg by mouth every 8 (eight) hours as needed for pain.    [name] albuterol (VENTOLIN HFA) 108 (90 Base) MCG/ACT inhaler TAKE 2 PUFFS BY MOUTH EVERY 6 HOURS AS NEEDED FOR WHEEZE OR SHORTNESS OF BREATH (Patient taking differently: Inhale 2 puffs into the lungs every 6 (six) hours as needed for shortness of breath or wheezing.) 8.5 g 5  . aspirin 81 MG tablet Take 81 mg by mouth at bedtime.    . blood glucose meter kit and supplies Dispense based on patient and insurance preference. Use up to two times daily as directed. (FOR ICD-10 E10.9, E11.9). 1 each 0  . Blood Glucose Monitoring Suppl (ONE TOUCH ULTRA 2) w/Device KIT OneTouch Ultra2 Meter    . buPROPion (WELLBUTRIN SR) 150 MG 12 hr tablet Take 1 tablet (150 mg total) by mouth daily. 30 tablet 0  . cefdinir (OMNICEF) 300 MG capsule Take 1 capsule (300 mg total) by mouth 2 (two) times daily. 20 capsule 0  . celecoxib (  CELEBREX) 200 MG capsule Take 200 mg by mouth 2 (two) times daily.    . citalopram (CELEXA) 20 MG tablet Take 1 tablet (20 mg total) by mouth daily. 90 tablet 1  . diphenhydramine-acetaminophen (TYLENOL PM) 25-500 MG TABS tablet Take 2 tablets by mouth at bedtime.    . famotidine (PEPCID) 20 MG tablet TAKE 1 TABLET BY MOUTH EVERYDAY AT BEDTIME (Patient taking differently: Take 20 mg by mouth at  bedtime.) 90 tablet 1  . glucose blood (ONETOUCH ULTRA) test strip TEST BLOOD SUGARS ONCE A DAY 100 strip 0  . glucose blood test strip OneTouch Ultra Blue Test Strip  TEST BLOOD SUGARS ONCE A DAY    . hydrochlorothiazide (MICROZIDE) 12.5 MG capsule TAKE 1 CAPSULE BY MOUTH EVERY DAY (Patient taking differently: Take 12.5 mg by mouth daily.) 90 capsule 1  . hydroxychloroquine (PLAQUENIL) 200 MG tablet Take 200 mg by mouth 2 (two) times daily.    . Insulin Pen Needle (BD PEN NEEDLE NANO U/F) 32G X 4 MM MISC 1 each by Other route daily. 100 each 0  . Lancets (ONETOUCH DELICA PLUS [id number]) MISC OneTouch Delica Plus Lancet 30 gauge  USE AS INSTRUCTED TO CHECK SUGAR TWICE A DAY    . liraglutide (VICTOZA) 18 MG/3ML SOPN Inject 1.8 mg into the skin every morning. 9 mL 0  . lisinopril (ZESTRIL) 40 MG tablet TAKE 1 TABLET BY MOUTH EVERY DAY (Patient taking differently: Take 40 mg by mouth daily.) 90 tablet 1  . metFORMIN (GLUCOPHAGE) 500 MG tablet Take 1 tablet (500 mg total) by mouth 2 (two) times daily with a meal. 60 tablet 0  . montelukast (SINGULAIR) 10 MG tablet TAKE 1 TABLET BY MOUTH EVERYDAY AT BEDTIME (Patient taking differently: Take 10 mg by mouth at bedtime.) 90 tablet 1  . nystatin (MYCOSTATIN) 100000 UNIT/ML suspension Take 5 mLs (500,000 Units total) by mouth 4 (four) times daily. Use for 10 days 200 mL 1  . omeprazole (PRILOSEC) 20 MG capsule TAKE 1 CAPSULE BY MOUTH EVERY DAY (Patient taking differently: Take 20 mg by mouth daily.) 90 capsule 1  . OneTouch Delica Lancets 95A MISC 1 each by Does not apply route 2 (two) times daily. 100 each 0  . topiramate (TOPAMAX) 50 MG tablet Take 1 tablet (50 mg total) by mouth at bedtime. 30 tablet 0  . Vitamin D, Ergocalciferol, (DRISDOL) 1.25 MG (50000 UNIT) CAPS capsule Take 1 capsule (50,000 Units total) by mouth every 7 (seven) days. (Patient taking differently: Take 50,000 Units by mouth every Thursday.) 4 capsule 0   No current  facility-administered medications on file prior to visit.    Past Medical History:  Diagnosis Date  . Anemia   . Anxiety   . Arthritis    inflammitory  . Asthma   . B12 deficiency   . Back pain   . Chronic joint pain   . Constipation   . DDD (degenerative disc disease), lumbar    low back and neck and shoulders  . Depression    during divorce & legal matters  . Fatty liver   . Fibromyalgia   . Gallbladder problem   . GERD (gastroesophageal reflux disease)   . History of kidney stones    Dr Phebe Colla  . History of polycystic ovarian disease S/P BSO  . Hx of anxiety disorder   . Hypertension    a  . Hypothyroidism    no meds  . IBS (irritable bowel syndrome)   . IBS (  irritable bowel syndrome)   . Infertility, female   . Joint pain   . Kidney problem   . Lactose intolerance   . Lupus (HCC) hx positive ANA   followed by Dr Gavin Pound; possible lupus  . Multiple food allergies   . Myalgia   . Osteoarthritis   . 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 POLYCYSTIC OVARIAN DISEASE 03/02/2007   Qualifier: Diagnosis of  By: Linna Darner MD, Rae Mar BSO for cysts & endometriosis; Dr Reino Kent, Gyn Seeing Dr Paula Compton    . PONV (postoperative nausea and vomiting)   . Prediabetes   . Sweating profusely   . Symptomatic mammary hypertrophy   . URI (upper respiratory infection)    currently on cefdinir    Past Surgical History:  Procedure Laterality Date  . [place]   exploratory lap  . BREAST REDUCTION SURGERY Bilateral 03/26/2015   Procedure: BILATERAL BREAST REDUCTION ;  Surgeon: Wallace Going, DO;  Location: [facility];  Service: Plastics;  Laterality: Bilateral;  . CARPAL TUNNEL RELEASE     bilateral; Dr Sherwood Gambler  . COLONOSCOPY     in 1990s  . CYSTO/ RIGHT RETROGRADE URETERAL PYELOGRAM  07-09-2004   HX BILATERAL RENAL STONES/ RIGHT FLANK PAIN  . CYSTOSCOPY W/ URETERAL STENT PLACEMENT  11/12/2011    Procedure: CYSTOSCOPY WITH RETROGRADE PYELOGRAM/URETERAL STENT PLACEMENT;  Surgeon: Alexis Frock, MD;  Location: [facility];  Service: Urology;  Laterality: Right;  . CYSTOSCOPY W/ URETERAL STENT REMOVAL  12/07/2011   Procedure: CYSTOSCOPY WITH STENT REMOVAL;  Surgeon: Alexis Frock, MD;  Location: [facility];  Service: Urology;  Laterality: Right;  . CYSTOSCOPY/RETROGRADE/URETEROSCOPY/STONE EXTRACTION WITH BASKET  12/07/2011   Procedure: CYSTOSCOPY/RETROGRADE/URETEROSCOPY/STONE EXTRACTION WITH BASKET;  Surgeon: Alexis Frock, MD;  Location: [facility];  Service: Urology;  Laterality: Right;  . DILATION AND CURETTAGE OF UTERUS     X5  . EXCISION LEFT BARTHOLIN GLAND  02-05-2002  . EYE SURGERY     Retinal tears surgery bilateral  . LAPAROSCOPIC ASSISTED VAGINAL HYSTERECTOMY  2000  . LAPAROSCOPIC CHOLECYSTECTOMY  05-31-2007  . LAPAROSCOPIC LASER ABLATION ENDOMETRIOSIS AND LEFT SALPINGO-OOPHORECTOMY  11-03-1999  . LAPAROSCOPY WITH RIGHT SALPINGO-OOPHECTOMY/ LYSIS ADHESIONS AND ABLATION ENDOMETRIOSIS  05-17-2001  . LIPOSUCTION Bilateral 03/26/2015   Procedure: WITH LIPOSUCTION;  Surgeon: Wallace Going, DO;  Location: [facility];  Service: Plastics;  Laterality: Bilateral;  . LUMBAR LAMINECTOMY  2003   L4 - L5  . PLANTAR FASCIA SURGERY     right  . PLANTAR FASCIA SURGERY  02/2011   left  . REDUCTION MAMMAPLASTY    . RIGHT URETEROSCOPIC STONE EXTRACTION  08-04-2000  . TONSILLECTOMY    . URETERAL STENT PLACEMENT  11/12/11   Dr Tresa Moore    Social History   Socioeconomic History  . Marital status: Married    Spouse name: Darnelle Maffucci  . Number of children: 0  . Years of education: college  . Highest education level: Not on file  Occupational History  . Occupation: disabled  Tobacco Use  . Smoking status: Never Smoker  . Smokeless tobacco: Never Used  Substance and Sexual Activity  . Alcohol use: Yes    Alcohol/week: 1.0 standard drink     Types: 1 Shots of liquor per week    Comment:  very rarely  . Drug use: No  . Sexual activity: Yes    Birth control/protection: Surgical  Other  Topics Concern  . Not on file  Social History Narrative   Patient Lives at home with her husband Darnelle Maffucci)   Disabled.   Education two years of college.   Right handed.   Caffeine coffee and sweet tea. Not daily.   Social Determinants of Health   Financial Resource Strain: Not on file  Food Insecurity: Not on file  Transportation Needs: Not on file  Physical Activity: Not on file  Stress: Not on file  Social Connections: Not on file    Family History  Problem Relation Age of Onset  . Hypertension Mother   . Colon polyps Mother   . Atrial fibrillation Mother        Dr Angelena Form  . Hyperlipidemia Mother   . Heart disease Mother   . Anxiety disorder Mother   . Sleep apnea Mother   . Obesity Mother   . Heart attack Father 64  . Diabetes Father   . Hypertension Father   . Hyperlipidemia Father   . Heart disease Father   . Stroke Father   . Obesity Father   . Hypertension Sister   . Heart attack Paternal Grandmother 65  . Breast cancer Paternal Aunt   . Colon cancer Maternal Uncle   . Colon cancer Paternal Uncle   . Colon polyps Maternal Grandmother   . Stroke Maternal Grandmother 76  . Diabetes Paternal Grandfather   . Stroke Maternal Grandfather 37    Review of Systems  Constitutional: Negative for chills and fever.  Eyes: Negative for visual disturbance.  Respiratory: Negative for cough, shortness of breath and wheezing.   Cardiovascular: Negative for chest pain, palpitations and leg swelling.  Gastrointestinal: Negative for abdominal pain, blood in stool, constipation, diarrhea and nausea.  Genitourinary: Negative for dysuria and hematuria.  Musculoskeletal: Positive for arthralgias.  Neurological: Positive for light-headedness (occ), numbness (in Right arm) and headaches (occ).       Objective:   Vitals:    04/28/20 1354  BP: 130/78  Pulse: 98  Temp: 98 F (36.7 C)  SpO2: 98%   Filed Weights   04/28/20 1354  Weight: 244 lb (110.7 kg)   Body mass index is 37.1 kg/m.  BP Readings from Last 3 Encounters:  04/28/20 130/78  04/22/20 118/70  04/15/20 133/82    Wt Readings from Last 3 Encounters:  04/28/20 244 lb (110.7 kg)  04/22/20 244 lb (110.7 kg)  04/15/20 241 lb (109.3 kg)     Physical Exam Constitutional: [name]. No distress.  HENT:  Head: Normocephalic and atraumatic.  Right Ear: External ear normal. Normal ear canal and TM Left Ear: External ear normal.  Normal ear canal and TM Mouth/Throat: Oropharynx is clear and moist.  Eyes: Conjunctivae are normal.  Neck: Neck supple. No tracheal deviation present. No thyromegaly present.  No carotid bruit  Cardiovascular: Normal rate, regular rhythm and normal heart sounds.   No murmur heard.  No edema. Pulmonary/Chest: Effort normal and breath sounds normal. No respiratory distress. She has no wheezes. She has no rales.  Abdominal: Soft. She exhibits no distension. There is no tenderness.  Lymphadenopathy: She has no cervical adenopathy.  Skin: Skin is warm and dry. She is not diaphoretic.  Psychiatric: She has a normal mood and affect. Her behavior is normal.        Assessment & Plan:       See Problem List for Assessment and Plan of chronic medical problems.

## 2020-04-28 ENCOUNTER — Encounter: Payer: Self-pay | Admitting: Orthopedic Surgery

## 2020-04-28 ENCOUNTER — Encounter: Payer: Self-pay | Admitting: Internal Medicine

## 2020-04-28 ENCOUNTER — Ambulatory Visit (INDEPENDENT_AMBULATORY_CARE_PROVIDER_SITE_OTHER): Payer: PPO | Admitting: Internal Medicine

## 2020-04-28 ENCOUNTER — Other Ambulatory Visit: Payer: Self-pay

## 2020-04-28 ENCOUNTER — Other Ambulatory Visit: Payer: Self-pay | Admitting: Orthopedic Surgery

## 2020-04-28 VITALS — BP 130/78 | HR 98 | Temp 98.0°F | Ht 68.0 in | Wt 244.0 lb

## 2020-04-28 DIAGNOSIS — R7303 Prediabetes: Secondary | ICD-10-CM | POA: Diagnosis not present

## 2020-04-28 DIAGNOSIS — Z01818 Encounter for other preprocedural examination: Secondary | ICD-10-CM | POA: Diagnosis not present

## 2020-04-28 DIAGNOSIS — I10 Essential (primary) hypertension: Secondary | ICD-10-CM

## 2020-04-28 DIAGNOSIS — F3289 Other specified depressive episodes: Secondary | ICD-10-CM

## 2020-04-28 DIAGNOSIS — F419 Anxiety disorder, unspecified: Secondary | ICD-10-CM | POA: Diagnosis not present

## 2020-04-28 LAB — URINALYSIS, ROUTINE W REFLEX MICROSCOPIC
Bilirubin Urine: NEGATIVE
Hgb urine dipstick: NEGATIVE
Ketones, ur: NEGATIVE
Nitrite: NEGATIVE
RBC / HPF: NONE SEEN (ref 0–?)
Specific Gravity, Urine: >=1.03 — AB (ref 1.000–1.030)
Total Protein, Urine: NEGATIVE
Urine Glucose: NEGATIVE
Urobilinogen, UA: 0.2 (ref 0.0–1.0)
pH: 6 (ref 5.0–8.0)

## 2020-04-28 LAB — CBC WITH DIFFERENTIAL/PLATELET
Basophils Absolute: 0.1 10*3/uL (ref 0.0–0.1)
Basophils Relative: 0.5 % (ref 0.0–3.0)
Eosinophils Absolute: 0.2 10*3/uL (ref 0.0–0.7)
Eosinophils Relative: 1.3 % (ref 0.0–5.0)
HCT: 42.2 % (ref 36.0–46.0)
Hemoglobin: 13.6 g/dL (ref 12.0–15.0)
Lymphocytes Relative: 24 % (ref 12.0–46.0)
Lymphs Abs: 3.2 10*3/uL (ref 0.7–4.0)
MCHC: 32.1 g/dL (ref 30.0–36.0)
MCV: 84.6 fl (ref 78.0–100.0)
Monocytes Absolute: 1 10*3/uL (ref 0.1–1.0)
Monocytes Relative: 7.9 % (ref 3.0–12.0)
Neutro Abs: 8.8 10*3/uL — ABNORMAL HIGH (ref 1.4–7.7)
Neutrophils Relative %: 66.3 % (ref 43.0–77.0)
Platelets: 329 10*3/uL (ref 150.0–400.0)
RBC: 4.99 Mil/uL (ref 3.87–5.11)
RDW: 13.8 % (ref 11.5–15.5)
WBC: 13.3 10*3/uL — ABNORMAL HIGH (ref 4.0–10.5)

## 2020-04-28 LAB — HEMOGLOBIN A1C: Hgb A1c MFr Bld: 5.9 % (ref 4.6–6.5)

## 2020-04-28 LAB — COMPREHENSIVE METABOLIC PANEL
ALT: 21 U/L (ref 0–35)
AST: 19 U/L (ref 0–37)
Albumin: 4.4 g/dL (ref 3.5–5.2)
Alkaline Phosphatase: 84 U/L (ref 39–117)
BUN: 32 mg/dL — ABNORMAL HIGH (ref 6–23)
CO2: 27 mEq/L (ref 19–32)
Calcium: 9.2 mg/dL (ref 8.4–10.5)
Chloride: 104 mEq/L (ref 96–112)
Creatinine, Ser: 0.95 mg/dL (ref 0.40–1.20)
GFR: 69.89 mL/min (ref >60.00)
Glucose, Bld: 80 mg/dL (ref 70–99)
Potassium: 4.1 mEq/L (ref 3.5–5.1)
Sodium: 139 mEq/L (ref 135–145)
Total Bilirubin: 0.3 mg/dL (ref 0.2–1.2)
Total Protein: 7.5 g/dL (ref 6.0–8.3)

## 2020-04-28 NOTE — Assessment & Plan Note (Signed)
Chronic Controlled, stable Continue citalopram 20 mg daily, bupropion 150 mg daily

## 2020-04-28 NOTE — Addendum Note (Signed)
Addended by: Raliegh Ip on: 04/28/2020 02:44 PM   Modules accepted: Orders

## 2020-04-28 NOTE — Patient Instructions (Signed)
  Blood work was ordered.  An EKG was done today.    Medications changes include :   none       Please followup in 6 months   

## 2020-04-28 NOTE — H&P (Deleted)
  The note originally documented on this encounter has been moved the the encounter in which it belongs.  

## 2020-04-28 NOTE — Assessment & Plan Note (Signed)
Chronic Controlled, stable Continue citalopram 20 mg daily

## 2020-04-28 NOTE — Assessment & Plan Note (Signed)
Chronic Working on weight loss Compliant with a low sugar/carbohydrate diet Check A1c

## 2020-04-28 NOTE — Assessment & Plan Note (Addendum)
Scheduled for surgery 2/7 for right shoulder arthroscopy and rotator cuff repair Local anesthesia with block, Dr. Kurtis Bushman Chronic medical problems stable No concerning symptoms suggestive of coronary artery disease or lung disease We will check a CBC, CMP, A1c, urinalysis and EKG EKG: Normal sinus rhythm at 74 bpm, normal EKG.  No previous EKG for comparison  Medically stable and low risk for surgical procedure.  Cleared for surgery.  Form with blood work, copy of EKG to be sent to surgery

## 2020-04-28 NOTE — Assessment & Plan Note (Signed)
Chronic BP well controlled Continue hydrochlorothiazide 12.5 mg daily, lisinopril 40 mg daily cmp

## 2020-04-28 NOTE — H&P (Signed)
NAME: [name] MRN:   [id number] DOB:   01-03-1971     HISTORY AND PHYSICAL  CHIEF COMPLAINT:  Right shoulder pain  HISTORY:   [name] a 50 y.o. female  with right  Shoulder Pain Patient complains of right shoulder pain. The symptoms began several years ago. Aggravating factors: repetitive activity. Pain is located between the neck and shoulder and around the acromioclavicular [facility]) joint. Discomfort is described as aching. Symptoms are exacerbated by repetitive movements, overhead movements and lying on the shoulder. Evaluation to date: MRI rotator cuff tear. Therapy to date includes: rest, ice, avoidance of offending activity, prescription NSAIDS which are somewhat effective, home exercises which are somewhat effective, physical therapy which was somewhat effective and corticosteroid injection which was somewhat effective.    Plan for right shoulder arthroscopy and rotator cuff repair.  PAST MEDICAL HISTORY:   Past Medical History:  Diagnosis Date   Anemia    Anxiety    Arthritis    inflammitory   Asthma    B12 deficiency    Back pain    Chronic joint pain    Constipation    DDD (degenerative disc disease), lumbar    low back and neck and shoulders   Depression    during divorce & legal matters   Fatty liver    Fibromyalgia    Gallbladder problem    GERD (gastroesophageal reflux disease)    History of kidney stones    Dr Phebe Colla   History of polycystic ovarian disease S/P BSO   Hx of anxiety disorder    Hypertension    a   Hypothyroidism    no meds   IBS (irritable bowel syndrome)    IBS (irritable bowel syndrome)    Infertility, female    Joint pain    Kidney problem    Lactose intolerance    Lupus (HCC) hx positive ANA   followed by Dr Gavin Pound; possible lupus   Multiple food allergies    Myalgia    Osteoarthritis    Polyarthritis, 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[place])    POLYCYSTIC OVARIAN DISEASE 03/02/2007   Qualifier:  Diagnosis of  By: Linna Darner MD, Rae Mar BSO for cysts & endometriosis; Dr Reino Kent, Gyn Seeing Dr Paula Compton     PONV (postoperative nausea and vomiting)    Prediabetes    Sweating profusely    Symptomatic mammary hypertrophy    URI (upper respiratory infection)    currently on cefdinir    PAST SURGICAL HISTORY:   Past Surgical History:  Procedure Laterality Date   ANKLE SURGERY     X3   APPENDECTOMY  1991   exploratory lap   BREAST REDUCTION SURGERY Bilateral 03/26/2015   Procedure: BILATERAL BREAST REDUCTION ;  Surgeon: Wallace Going, DO;  Location: [place];  Service: Plastics;  Laterality: Bilateral;   CARPAL TUNNEL RELEASE     bilateral; Dr Sherwood Gambler   COLONOSCOPY     in [place]  07-09-2004   HX BILATERAL RENAL STONES/ RIGHT FLANK PAIN   CYSTOSCOPY W/ URETERAL STENT PLACEMENT  11/12/2011   Procedure: CYSTOSCOPY WITH RETROGRADE PYELOGRAM/URETERAL STENT PLACEMENT;  Surgeon: Alexis Frock, MD;  Location: [facility];  Service: Urology;  Laterality: Right;   CYSTOSCOPY W/ URETERAL STENT REMOVAL  12/07/2011   Procedure: CYSTOSCOPY WITH STENT REMOVAL;  Surgeon: Alexis Frock, MD;  Location: [facility];  Service: Urology;  Laterality: Right;   CYSTOSCOPY/RETROGRADE/URETEROSCOPY/STONE EXTRACTION  WITH BASKET  12/07/2011   Procedure: CYSTOSCOPY/RETROGRADE/URETEROSCOPY/STONE EXTRACTION WITH BASKET;  Surgeon: Alexis Frock, MD;  Location: [facility];  Service: Urology;  Laterality: Right;   DILATION AND CURETTAGE OF UTERUS     X5   EXCISION LEFT BARTHOLIN GLAND  02-05-2002   EYE SURGERY     Retinal tears surgery bilateral   LAPAROSCOPIC ASSISTED VAGINAL HYSTERECTOMY  2000   LAPAROSCOPIC CHOLECYSTECTOMY  05-31-2007   LAPAROSCOPIC LASER ABLATION ENDOMETRIOSIS AND LEFT SALPINGO-OOPHORECTOMY  11-03-1999   LAPAROSCOPY WITH RIGHT SALPINGO-OOPHECTOMY/ LYSIS ADHESIONS AND  ABLATION ENDOMETRIOSIS  05-17-2001   LIPOSUCTION Bilateral 03/26/2015   Procedure: WITH LIPOSUCTION;  Surgeon: Wallace Going, DO;  Location: [place];  Service: Plastics;  Laterality: Bilateral;   LUMBAR LAMINECTOMY  2003   L4 - L5   PLANTAR FASCIA SURGERY     right   PLANTAR FASCIA SURGERY  02/2011   left   REDUCTION MAMMAPLASTY     RIGHT URETEROSCOPIC STONE EXTRACTION  08-04-2000   TONSILLECTOMY     URETERAL STENT PLACEMENT  11/12/11   Dr Tresa Moore    MEDICATIONS:  (Not in a hospital admission)   ALLERGIES:   Allergies  Allergen Reactions   Chlorhexidine Gluconate Rash, Itching and Swelling    Developed severe rash where chloraprep was used on chest area   Ivp Dye [Iodinated Diagnostic Agents] Rash and Other (See Comments)    Flushing, minor facial rash & dyspnea   Nalbuphine Shortness Of Breath and Rash    Nubain caused respiratory distress & rash   Septra [Sulfamethoxazole-Trimethoprim] Shortness Of Breath and Rash   Diclofenac Other (See Comments)    GI Upset   Adhesive [Tape] Rash   Augmentin [Amoxicillin-Pot Clavulanate] Nausea And Vomiting   Betadine [Povidone Iodine] Rash   Latex Rash    REVIEW OF SYSTEMS:   Negative except HPI  FAMILY HISTORY:   Family History  Problem Relation Age of Onset   Hypertension Mother    Colon polyps Mother    Atrial fibrillation Mother        Dr Angelena Form   Hyperlipidemia Mother    Heart disease Mother    Anxiety disorder Mother    Sleep apnea Mother    Obesity Mother    Heart attack Father 74   Diabetes Father    Hypertension Father    Hyperlipidemia Father    Heart disease Father    Stroke Father    Obesity Father    Hypertension Sister    Heart attack Paternal Grandmother 35   Breast cancer Paternal Aunt    Colon cancer Maternal Uncle    Colon cancer Paternal Uncle    Colon polyps Maternal Grandmother    Stroke Maternal Grandmother 76   Diabetes Paternal  Grandfather    Stroke Maternal Grandfather 37    SOCIAL HISTORY:   reports that she has never smoked. She has never used smokeless tobacco. She reports current alcohol use of about 1.0 standard drink of alcohol per week. She reports that she does not use drugs.  PHYSICAL EXAM:  General appearance: alert, cooperative and no distress Neck: no JVD and supple, symmetrical, trachea midline Resp: clear to auscultation bilaterally Cardio: regular rate and rhythm, S1, S2 normal, no murmur, click, rub or gallop GI: soft, non-tender; bowel sounds normal; no masses,  no organomegaly Extremities: extremities normal, atraumatic, no cyanosis or edema and Homans sign is negative, no sign of DVT Pulses: 2+ and symmetric Skin: Skin color, texture, turgor normal. No  rashes or lesions Incision/Wound:    LABORATORY STUDIES: No results for input(s): WBC, HGB, HCT, PLT in the last 72 hours.  No results for input(s): NA, K, CL, CO2, GLUCOSE, BUN, CREATININE, CALCIUM in the last 72 hours.  STUDIES/RESULTS:  No results found.  ASSESSMENT:  Right shoulder impingement and rotator cuff tear        Active Problems:   * No active hospital problems. *    PLAN:  Right shoulder arthroscopy and rotator cuff repair   Altamese Cabal 04/28/2020. 1:54 PM

## 2020-04-29 ENCOUNTER — Other Ambulatory Visit
Admission: RE | Admit: 2020-04-29 | Discharge: 2020-04-29 | Disposition: A | Discharge status: Home / Self Care | Payer: PPO | Source: Ambulatory Visit | Attending: Orthopedic Surgery | Admitting: Orthopedic Surgery

## 2020-04-29 ENCOUNTER — Telehealth: Payer: Self-pay | Admitting: Internal Medicine

## 2020-04-29 HISTORY — DX: Prediabetes: R73.03

## 2020-04-29 NOTE — Patient Instructions (Signed)
Your procedure is scheduled on: Monday May 11, 2020. Report to Day Surgery inside [facility] 2nd floor (stop by Admissions desk first, before going upstairs). To find out your arrival time please call [phone] between 1PM - 3PM on Friday May 08, 2020.  Remember: Instructions that are not followed completely may result in serious medical risk,  up to and including death, or upon the discretion of your surgeon and anesthesiologist your  surgery may need to be rescheduled.     _X__ 1. Do not eat food after midnight the night before your procedure.                 No chewing gum or hard candies. You may drink clear liquids up to 2 hours                 before you are scheduled to arrive for your surgery- DO not drink clear                 liquids within 2 hours of the start of your surgery.                 Clear Liquids include:  water, Black Coffee or Tea (Do not add                 anything to coffee or tea).  __X__2.  On the morning of surgery brush your teeth with toothpaste and water, you                may rinse your mouth with mouthwash if you wish.  Do not swallow any toothpaste of mouthwash.     _X__ 3.  No Alcohol for 24 hours before or after surgery.   _X__ 4.  Do Not Smoke or use e-cigarettes For 24 Hours Prior to Your Surgery.                 Do not use any chewable tobacco products for at least 6 hours prior to                 Surgery.  _X__  5.  Do not use any recreational drugs (marijuana, cocaine, heroin, ecstasy, MDMA or other)                For at least one week prior to your surgery.  Combination of these drugs with anesthesia                May have life threatening results.  __X__6.  Notify your doctor if there is any change in your medical condition      (cold, fever, infections).     Do not wear jewelry, make-up, hairpins, clips or nail polish. Do not wear lotions, powders, or perfumes. You may wear deodorant. Do not shave  48 hours prior to surgery. Men may shave face and neck. Do not bring valuables to the hospital.    [facility] is not responsible for any belongings or valuables.  Contacts, dentures or bridgework may not be worn into surgery. Leave your suitcase in the car. After surgery it may be brought to your room. For patients admitted to the hospital, discharge time is determined by your treatment team.   Patients discharged the day of surgery will not be allowed to drive home.   Make arrangements for someone to be with you for the first 24 hours of your Same Day Discharge.   __x__ Take these medicines the morning of surgery  with A SIP OF WATER:    1. celecoxib (CELEBREX) 200 MG   2. hydroxychloroquine (PLAQUENIL) 200 MG  3. omeprazole (PRILOSEC) 20 MG   4. citalopram (CELEXA) 20 MG  5. buPROPion (WELLBUTRIN SR) 150 MG     ____ Fleet Enema (as directed)   __X__ Use Antibacterial Soap as directed  ____ Use Benzoyl Peroxide Gel as instructed  ____ Use inhalers on the day of surgery  __X__ Stop metformin 2 days prior to surgery (last dose February 4)    ____ Take 1/2 of usual insulin dose the night before surgery. No insulin the morning          of surgery.   __X__ Stop Anti-inflammatories such as Ibuprofen, Aleve, Advil, naproxen, aspirin and or BC powders.   __X__ Stop supplements until after surgery.    __X__ Do not start any herbal supplements before your procedure.    If you have any questions regarding your pre-procedure instructions,  Please call Pre-admit Testing at [phone].

## 2020-04-29 NOTE — Telephone Encounter (Signed)
    [name] from [facility] pre surg testing requesting EKG from 04/28/20 be faxed to her at [phone] Phone [phone]

## 2020-04-29 NOTE — Telephone Encounter (Signed)
Faxed this morning.

## 2020-04-29 NOTE — Progress Notes (Signed)
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 Patient has a documented allergy to amoxicillin-clavulanate only  . Advising that amoxicillin-clavulanate has caused her to experience nausea and vomiting in the past.   2. Received PCN/cephalosporin with no documented complications . CEFTIN received on 08/11/2011 . PIPERACILLIN-TAZOBACTAM received on 11/12/2011 . CEPHALEXIN received on 0/02/2012 and 04/28/2016 . CEFAZOLIN received on 03/26/2015 and 04/19/2015 . CEFDINIR received on 01/22/2018 and 04/22/2020  3. Screened as appropriate for cephalosporin use during medication reconciliation . No immediate angioedema, dysphagia, SOB, anaphylaxis symptoms. . No severe rash involving mucous membranes or skin necrosis. . No hospital admissions related to side effects of PCN/cephalosporin use.  . No documented reaction to PCN or cephalosporin in the last 10 years.  Request:  As an evidence based approach to reducing the rate of incidence for post-operative SSI and the development of MDROs, could an agent with narrower coverage for preoperative prophylaxis in this patient's upcoming surgical course be considered?   1. Currently ordered preoperative prophylactic ABX: clindamycin.   2. Specifically requesting change to cephalosporin (CEFAZOLIN).   3. Please communicate decision  with me and I will change the orders in Epic as per your direction.   Things to consider:  Many patients report that they were "allergic" to PCN earlier in life, however this does not translate into a true lifelong allergy. Patients can lose sensitivity to specific IgE antibodies over time if PCN is avoided (Kleris & Lugar, 2019).   Up to 10% of the adult population and 15% of hospitalized patients report an allergy to PCN, however clinical studies suggest that 90% of those reporting an allergy can tolerate PCN antibiotics (Kleris & Lugar, 2019).   Cross-sensitivity between PCN and cephalosporins has been documented as being as high as 10%, however this estimation included data believed to have been collected in a setting where there was contamination. Newer data suggests that the prevalence of cross-sensitivity between PCN and cephalosporins is actually estimated to be closer to 1% (Hermanides et al., 2018).    Patients labeled as PCN allergic, whether they are truly allergic or not, have been found to have inferior outcomes in terms of rates of serious infection, and these patients tend to have longer hospital stays ([place], 2019).   Treatment related secondary infections, such as Clostridioides difficile, have been linked to the improper use of broad spectrum antibiotics in patients improperly labeled as PCN allergic (Kleris & Lugar, 2019).   Anaphylaxis from cephalosporins is rare and the evidence suggests that there is no increased risk of an anaphylactic type reaction when cephalosporins are used in a PCN allergic patient (Pichichero, 2006).  Citations: Hermanides J, Lemkes BA, Prins Pearla Dubonnet MW, [place] I. Presumed ?-Lactam Allergy and Cross-reactivity in the Operating Theater: A Practical Approach. Anesthesiology. 2018 Aug;129(2):335-342. doi: 10.1097/ALN.0000000000002252. PMID: 00938182.  Kleris, [place]., & Lugar, P. L. (2019). Things We Do For  No Reason: Failing to Question  a Penicillin Allergy History. Journal of hospital medicine, 14(10), [phone]. Advance online publication. https://www.wallace-middleton.info/  Pichichero, M. E. (2006). Cephalosporins can be prescribed safely for penicillin-allergic patients. Journal of family medicine, 55(2), 106-112. Accessed: https://cdn.mdedge.com/files/s55fs-public/Document/September-2017/5502JFP_AppliedEvidence1.pdf   Honor Loh, MSN, APRN, FNP-C, CEN [facility]  Peri-operative Services Nurse Practitioner FAX: [phone] 04/29/20 12:42 PM

## 2020-04-30 ENCOUNTER — Ambulatory Visit (INDEPENDENT_AMBULATORY_CARE_PROVIDER_SITE_OTHER): Payer: PPO | Admitting: Family Medicine

## 2020-04-30 ENCOUNTER — Encounter (INDEPENDENT_AMBULATORY_CARE_PROVIDER_SITE_OTHER): Payer: Self-pay | Admitting: Family Medicine

## 2020-04-30 ENCOUNTER — Other Ambulatory Visit: Payer: Self-pay

## 2020-04-30 VITALS — BP 121/80 | HR 63 | Temp 98.3°F | Ht 68.0 in | Wt 236.0 lb

## 2020-04-30 DIAGNOSIS — E559 Vitamin D deficiency, unspecified: Secondary | ICD-10-CM | POA: Diagnosis not present

## 2020-04-30 DIAGNOSIS — R7303 Prediabetes: Secondary | ICD-10-CM

## 2020-04-30 DIAGNOSIS — Z6835 Body mass index (BMI) 35.0-35.9, adult: Secondary | ICD-10-CM | POA: Diagnosis not present

## 2020-04-30 NOTE — Progress Notes (Signed)
Chief Complaint:   OBESITY [name] is here to discuss her progress with her obesity treatment plan along with follow-up of her obesity related diagnoses. [name] is on the Category 3 Plan and states she is following her eating plan approximately 80% of the time. [name] states she is walking 4-5 times per week.  Today's visit was #: 10 Starting weight: 241 lbs Starting date: 09/28/2016 Today's weight: 236 lbs Today's date: 04/30/2020 Total lbs lost to date: 5 Total lbs lost since last in-office visit: 5  Interim History: [name] was changed to the Category 3 plan, and she has done very well with weight loss. He hunger is mostly controlled and she has been trying to increase her activity. She would like to continue on this plan longer, but she does struggle to eat all of her food at times.  Subjective:   1. Vitamin D deficiency [name] is on Vit D, and she is due to have labs done. She has been on Vit D for >1 year.  2. Pre-diabetes [name] is working on diet and weight loss, and she is due to have her labs done to monitor her progress. She is stable on metformin and Victoza.  Assessment/Plan:   1. Vitamin D deficiency Low Vitamin D level contributes to fatigue and are associated with obesity, breast, and colon cancer. We will check labs today. [name] will follow-up for routine testing of Vitamin D, at least 2-3 times per year to avoid over-replacement.  - VITAMIN D 25 Hydroxy (Vit-D Deficiency, Fractures)  2. Pre-diabetes [name] will continue her Category 3 plan and continue her medications as is for now. She will continue to work on weight loss, exercise, and decreasing simple carbohydrates to help decrease the risk of diabetes. We will check labs today.  - Insulin, random  3. Class 2 severe obesity with serious comorbidity and body mass index (BMI) of 35.0 to 35.9 in adult, unspecified obesity type [name]) [name] is currently in the action stage of change. As  such, her goal is to continue with weight loss efforts. She has agreed to the Category 3 Plan.   Exercise goals:  As is.  Behavioral modification strategies: increasing lean protein intake and no skipping meals.  [name] has agreed to follow-up with our clinic in 2 weeks. She was informed of the importance of frequent follow-up visits to maximize her success with intensive lifestyle modifications for her multiple health conditions.   [name] was informed we would discuss her lab results at her next visit unless there is a critical issue that needs to be addressed sooner. [name] agreed to keep her next visit at the agreed upon time to discuss these results.  Objective:   Blood pressure 121/80, pulse 63, temperature 98.3 F (36.8 C), height 5\' 8"  (1.727 m), weight 236 lb (107 kg), SpO2 97 %. Body mass index is 35.88 kg/m.  General: Cooperative, alert, well developed, in no acute distress. HEENT: Conjunctivae and lids unremarkable. Cardiovascular: Regular rhythm.  Lungs: Normal work of breathing. Neurologic: No focal deficits.   Lab Results  Component Value Date   CREATININE 0.95 04/28/2020   BUN 32 (H) 04/28/2020   NA 139 04/28/2020   K 4.1 04/28/2020   CL 104 04/28/2020   CO2 27 04/28/2020   Lab Results  Component Value Date   ALT 21 04/28/2020   AST 19 04/28/2020   ALKPHOS 84 04/28/2020   BILITOT 0.3 04/28/2020   Lab Results  Component Value Date   HGBA1C 5.9  04/28/2020   HGBA1C 5.5 11/11/2019   HGBA1C 5.3 07/08/2019   HGBA1C 5.4 02/21/2019   HGBA1C 5.2 10/29/2018   Lab Results  Component Value Date   INSULIN 16.4 11/11/2019   INSULIN 17.3 02/21/2019   INSULIN 14.1 10/29/2018   INSULIN 17.8 05/03/2018   INSULIN 15.3 01/09/2018   Lab Results  Component Value Date   TSH 1.800 11/11/2019   Lab Results  Component Value Date   CHOL 152 11/11/2019   HDL 58 11/11/2019   LDLCALC 72 11/11/2019   TRIG 122 11/11/2019   CHOLHDL 3 08/16/2016   Lab Results   Component Value Date   WBC 13.3 (H) 04/28/2020   HGB 13.6 04/28/2020   HCT 42.2 04/28/2020   MCV 84.6 04/28/2020   PLT 329.0 04/28/2020   No results found for: IRON, TIBC, FERRITIN  Obesity Behavioral Intervention:   Approximately 15 minutes were spent on the discussion below.  ASK: We discussed the diagnosis of obesity with [name] today and [name] agreed to give [place] permission to discuss obesity behavioral modification therapy today.  ASSESS: [name] has the diagnosis of obesity and her BMI today is 35.89. [name] is in the action stage of change.   ADVISE: [name] was educated on the multiple health risks of obesity as well as the benefit of weight loss to improve her health. She was advised of the need for long term treatment and the importance of lifestyle modifications to improve her current health and to decrease her risk of future health problems.  AGREE: Multiple dietary modification options and treatment options were discussed and [name] agreed to follow the recommendations documented in the above note.  ARRANGE: [name] was educated on the importance of frequent visits to treat obesity as outlined per CMS and USPSTF guidelines and agreed to schedule her next follow up appointment today.  Attestation Statements:   Reviewed by clinician on day of visit: allergies, medications, problem list, medical history, surgical history, family history, social history, and previous encounter notes.   I, Trixie Dredge, am acting as transcriptionist for Dennard Nip, MD.  I have reviewed the above documentation for accuracy and completeness, and I agree with the above. -  Dennard Nip, MD

## 2020-05-01 LAB — VITAMIN D 25 HYDROXY (VIT D DEFICIENCY, FRACTURES): Vit D, 25-Hydroxy: 36.7 ng/mL (ref 30.0–100.0)

## 2020-05-01 LAB — INSULIN, RANDOM: INSULIN: 25.1 u[IU]/mL — ABNORMAL HIGH (ref 2.6–24.9)

## 2020-05-02 ENCOUNTER — Other Ambulatory Visit: Payer: Self-pay | Admitting: Internal Medicine

## 2020-05-07 ENCOUNTER — Other Ambulatory Visit
Admission: RE | Admit: 2020-05-07 | Discharge: 2020-05-07 | Disposition: A | Discharge status: Home / Self Care | Payer: PPO | Source: Ambulatory Visit | Attending: Orthopedic Surgery | Admitting: Orthopedic Surgery

## 2020-05-07 ENCOUNTER — Other Ambulatory Visit: Payer: Self-pay

## 2020-05-07 DIAGNOSIS — Z20822 Contact with and (suspected) exposure to covid-19: Secondary | ICD-10-CM | POA: Diagnosis not present

## 2020-05-07 DIAGNOSIS — Z01812 Encounter for preprocedural laboratory examination: Secondary | ICD-10-CM | POA: Diagnosis not present

## 2020-05-07 LAB — SARS CORONAVIRUS 2 (TAT 6-24 HRS): SARS Coronavirus 2: NEGATIVE

## 2020-05-08 ENCOUNTER — Encounter: Payer: Self-pay | Admitting: Internal Medicine

## 2020-05-08 NOTE — Progress Notes (Signed)
Outside notes received. Information abstracted. Notes sent to scan.  

## 2020-05-10 MED ORDER — SODIUM CHLORIDE 0.9 % IV SOLN: INTRAVENOUS | Status: DC

## 2020-05-10 MED ORDER — CHLORHEXIDINE GLUCONATE 0.12 % MT SOLN: 15.0000 mL | Freq: Once | OROMUCOSAL | Status: AC

## 2020-05-10 MED ORDER — SCOPOLAMINE 1 MG/3DAYS TD PT72: 1.0000 | MEDICATED_PATCH | TRANSDERMAL | Status: DC

## 2020-05-10 MED ORDER — ORAL CARE MOUTH RINSE: 15.0000 mL | Freq: Once | OROMUCOSAL | Status: AC

## 2020-05-11 ENCOUNTER — Ambulatory Visit
Admission: RE | Admit: 2020-05-11 | Discharge: 2020-05-11 | Disposition: A | Discharge status: Home / Self Care | Payer: PPO | Attending: Orthopedic Surgery | Admitting: Orthopedic Surgery

## 2020-05-11 ENCOUNTER — Other Ambulatory Visit (INDEPENDENT_AMBULATORY_CARE_PROVIDER_SITE_OTHER): Payer: Self-pay | Admitting: Family Medicine

## 2020-05-11 ENCOUNTER — Encounter
Admission: RE | Disposition: A | Discharge status: Home / Self Care | Payer: Self-pay | Source: Home / Self Care | Attending: Orthopedic Surgery

## 2020-05-11 ENCOUNTER — Ambulatory Visit: Payer: PPO

## 2020-05-11 ENCOUNTER — Other Ambulatory Visit: Payer: Self-pay

## 2020-05-11 ENCOUNTER — Encounter: Payer: Self-pay | Admitting: Orthopedic Surgery

## 2020-05-11 ENCOUNTER — Ambulatory Visit: Payer: PPO | Admitting: Anesthesiology

## 2020-05-11 DIAGNOSIS — I10 Essential (primary) hypertension: Secondary | ICD-10-CM | POA: Insufficient documentation

## 2020-05-11 DIAGNOSIS — Z9049 Acquired absence of other specified parts of digestive tract: Secondary | ICD-10-CM | POA: Insufficient documentation

## 2020-05-11 DIAGNOSIS — Z91041 Radiographic dye allergy status: Secondary | ICD-10-CM | POA: Insufficient documentation

## 2020-05-11 DIAGNOSIS — Z882 Allergy status to sulfonamides status: Secondary | ICD-10-CM | POA: Diagnosis not present

## 2020-05-11 DIAGNOSIS — K760 Fatty (change of) liver, not elsewhere classified: Secondary | ICD-10-CM | POA: Insufficient documentation

## 2020-05-11 DIAGNOSIS — R7303 Prediabetes: Secondary | ICD-10-CM | POA: Insufficient documentation

## 2020-05-11 DIAGNOSIS — Z881 Allergy status to other antibiotic agents status: Secondary | ICD-10-CM | POA: Diagnosis not present

## 2020-05-11 DIAGNOSIS — M797 Fibromyalgia: Secondary | ICD-10-CM | POA: Diagnosis not present

## 2020-05-11 DIAGNOSIS — Z91040 Latex allergy status: Secondary | ICD-10-CM | POA: Insufficient documentation

## 2020-05-11 DIAGNOSIS — G8918 Other acute postprocedural pain: Secondary | ICD-10-CM | POA: Diagnosis not present

## 2020-05-11 DIAGNOSIS — Z419 Encounter for procedure for purposes other than remedying health state, unspecified: Secondary | ICD-10-CM

## 2020-05-11 DIAGNOSIS — Z8249 Family history of ischemic heart disease and other diseases of the circulatory system: Secondary | ICD-10-CM | POA: Insufficient documentation

## 2020-05-11 DIAGNOSIS — Z90710 Acquired absence of both cervix and uterus: Secondary | ICD-10-CM | POA: Diagnosis not present

## 2020-05-11 DIAGNOSIS — M24111 Other articular cartilage disorders, right shoulder: Secondary | ICD-10-CM | POA: Diagnosis not present

## 2020-05-11 DIAGNOSIS — Z880 Allergy status to penicillin: Secondary | ICD-10-CM | POA: Diagnosis not present

## 2020-05-11 DIAGNOSIS — M329 Systemic lupus erythematosus, unspecified: Secondary | ICD-10-CM | POA: Insufficient documentation

## 2020-05-11 DIAGNOSIS — Z888 Allergy status to other drugs, medicaments and biological substances status: Secondary | ICD-10-CM | POA: Diagnosis not present

## 2020-05-11 DIAGNOSIS — Z90722 Acquired absence of ovaries, bilateral: Secondary | ICD-10-CM | POA: Insufficient documentation

## 2020-05-11 DIAGNOSIS — E559 Vitamin D deficiency, unspecified: Secondary | ICD-10-CM

## 2020-05-11 DIAGNOSIS — M25511 Pain in right shoulder: Secondary | ICD-10-CM | POA: Diagnosis not present

## 2020-05-11 DIAGNOSIS — M75121 Complete rotator cuff tear or rupture of right shoulder, not specified as traumatic: Secondary | ICD-10-CM | POA: Insufficient documentation

## 2020-05-11 HISTORY — PX: SHOULDER ARTHROSCOPY WITH ROTATOR CUFF REPAIR: SHX5685

## 2020-05-11 LAB — GLUCOSE, CAPILLARY: Glucose-Capillary: 98 mg/dL (ref 70–99)

## 2020-05-11 SURGERY — ARTHROSCOPY, SHOULDER, WITH ROTATOR CUFF REPAIR
Anesthesia: General | Laterality: Right

## 2020-05-11 MED ORDER — FENTANYL CITRATE (PF) 100 MCG/2ML IJ SOLN
INTRAMUSCULAR | Status: AC
  Filled 2020-05-11: qty 2

## 2020-05-11 MED ORDER — MIDAZOLAM HCL 2 MG/2ML IJ SOLN
INTRAMUSCULAR | Status: AC
  Filled 2020-05-11: qty 2

## 2020-05-11 MED ORDER — ACETAMINOPHEN 325 MG PO TABS: 325.0000 mg | ORAL_TABLET | Freq: Four times a day (QID) | ORAL | Status: DC | PRN

## 2020-05-11 MED ORDER — ROCURONIUM BROMIDE 10 MG/ML (PF) SYRINGE
PREFILLED_SYRINGE | INTRAVENOUS | Status: AC
  Filled 2020-05-11: qty 10

## 2020-05-11 MED ORDER — LIDOCAINE HCL (PF) 1 % IJ SOLN
INTRAMUSCULAR | Status: DC | PRN
  Administered 2020-05-11: 2 mL

## 2020-05-11 MED ORDER — METOCLOPRAMIDE HCL 5 MG/ML IJ SOLN: 5.0000 mg | Freq: Three times a day (TID) | INTRAMUSCULAR | Status: DC | PRN

## 2020-05-11 MED ORDER — ACETAMINOPHEN 10 MG/ML IV SOLN
INTRAVENOUS | Status: DC | PRN
  Administered 2020-05-11: 1000 mg via INTRAVENOUS

## 2020-05-11 MED ORDER — ACETAMINOPHEN 10 MG/ML IV SOLN
INTRAVENOUS | Status: AC
  Filled 2020-05-11: qty 100

## 2020-05-11 MED ORDER — GLYCOPYRROLATE 0.2 MG/ML IJ SOLN
INTRAMUSCULAR | Status: AC
  Filled 2020-05-11: qty 1

## 2020-05-11 MED ORDER — HYDROCODONE-ACETAMINOPHEN 7.5-325 MG PO TABS: 1.0000 | ORAL_TABLET | ORAL | Status: DC | PRN

## 2020-05-11 MED ORDER — CHLORHEXIDINE GLUCONATE 0.12 % MT SOLN
OROMUCOSAL | Status: AC
  Administered 2020-05-11: 15 mL via OROMUCOSAL
  Filled 2020-05-11: qty 15

## 2020-05-11 MED ORDER — BUPIVACAINE HCL (PF) 0.5 % IJ SOLN
INTRAMUSCULAR | Status: DC | PRN
  Administered 2020-05-11 (×2): 5 mL

## 2020-05-11 MED ORDER — DEXAMETHASONE SODIUM PHOSPHATE 10 MG/ML IJ SOLN
INTRAMUSCULAR | Status: AC
  Filled 2020-05-11: qty 1

## 2020-05-11 MED ORDER — EPINEPHRINE PF 1 MG/ML IJ SOLN
INTRAMUSCULAR | Status: AC
  Filled 2020-05-11: qty 4

## 2020-05-11 MED ORDER — HYDROCODONE-ACETAMINOPHEN 5-325 MG PO TABS: 1.0000 | ORAL_TABLET | ORAL | 0 refills | Status: DC | PRN

## 2020-05-11 MED ORDER — LACTATED RINGERS IV SOLN: INTRAVENOUS | Status: DC

## 2020-05-11 MED ORDER — MORPHINE SULFATE (PF) 2 MG/ML IV SOLN: 0.5000 mg | INTRAVENOUS | Status: DC | PRN

## 2020-05-11 MED ORDER — ROCURONIUM BROMIDE 100 MG/10ML IV SOLN
INTRAVENOUS | Status: DC | PRN
  Administered 2020-05-11: 50 mg via INTRAVENOUS

## 2020-05-11 MED ORDER — MIDAZOLAM HCL 2 MG/2ML IJ SOLN: 1.0000 mg | Freq: Once | INTRAMUSCULAR | Status: AC

## 2020-05-11 MED ORDER — CLINDAMYCIN PHOSPHATE 900 MG/50ML IV SOLN
900.0000 mg | INTRAVENOUS | Status: AC
  Administered 2020-05-11 (×2): 900 mg via INTRAVENOUS

## 2020-05-11 MED ORDER — FENTANYL CITRATE (PF) 100 MCG/2ML IJ SOLN
INTRAMUSCULAR | Status: DC | PRN
  Administered 2020-05-11 (×4): 50 ug via INTRAVENOUS

## 2020-05-11 MED ORDER — PROPOFOL 10 MG/ML IV BOLUS
INTRAVENOUS | Status: DC | PRN
  Administered 2020-05-11: 200 mg via INTRAVENOUS

## 2020-05-11 MED ORDER — SCOPOLAMINE 1 MG/3DAYS TD PT72
MEDICATED_PATCH | TRANSDERMAL | Status: AC
  Administered 2020-05-11: 1.5 mg via TRANSDERMAL
  Filled 2020-05-11: qty 1

## 2020-05-11 MED ORDER — ONDANSETRON HCL 4 MG/2ML IJ SOLN
INTRAMUSCULAR | Status: DC | PRN
  Administered 2020-05-11: 4 mg via INTRAVENOUS

## 2020-05-11 MED ORDER — SUGAMMADEX SODIUM 200 MG/2ML IV SOLN
INTRAVENOUS | Status: DC | PRN
  Administered 2020-05-11: 200 mg via INTRAVENOUS

## 2020-05-11 MED ORDER — SODIUM CHLORIDE 0.9 % IV SOLN
INTRAVENOUS | Status: DC | PRN
  Administered 2020-05-11: 50 ug/min via INTRAVENOUS

## 2020-05-11 MED ORDER — ONDANSETRON HCL 4 MG/2ML IJ SOLN: 4.0000 mg | Freq: Four times a day (QID) | INTRAMUSCULAR | Status: DC | PRN

## 2020-05-11 MED ORDER — METOCLOPRAMIDE HCL 10 MG PO TABS: 5.0000 mg | ORAL_TABLET | Freq: Three times a day (TID) | ORAL | Status: DC | PRN

## 2020-05-11 MED ORDER — SODIUM CHLORIDE FLUSH 0.9 % IV SOLN
INTRAVENOUS | Status: AC
  Filled 2020-05-11: qty 50

## 2020-05-11 MED ORDER — ONDANSETRON HCL 4 MG/2ML IJ SOLN
INTRAMUSCULAR | Status: AC
  Filled 2020-05-11: qty 2

## 2020-05-11 MED ORDER — ONDANSETRON HCL 4 MG PO TABS: 4.0000 mg | ORAL_TABLET | Freq: Four times a day (QID) | ORAL | Status: DC | PRN

## 2020-05-11 MED ORDER — MIDAZOLAM HCL 2 MG/2ML IJ SOLN
INTRAMUSCULAR | Status: AC
  Administered 2020-05-11: 1 mg via INTRAVENOUS
  Filled 2020-05-11: qty 2

## 2020-05-11 MED ORDER — FENTANYL CITRATE (PF) 100 MCG/2ML IJ SOLN: 50.0000 ug | Freq: Once | INTRAMUSCULAR | Status: AC

## 2020-05-11 MED ORDER — ROPIVACAINE HCL 5 MG/ML IJ SOLN
INTRAMUSCULAR | Status: AC
  Filled 2020-05-11: qty 20

## 2020-05-11 MED ORDER — HYDROCODONE-ACETAMINOPHEN 5-325 MG PO TABS: 1.0000 | ORAL_TABLET | ORAL | Status: DC | PRN

## 2020-05-11 MED ORDER — FENTANYL CITRATE (PF) 100 MCG/2ML IJ SOLN
INTRAMUSCULAR | Status: AC
  Administered 2020-05-11: 50 ug via INTRAVENOUS
  Filled 2020-05-11: qty 2

## 2020-05-11 MED ORDER — BUPIVACAINE LIPOSOME 1.3 % IJ SUSP
INTRAMUSCULAR | Status: DC | PRN
  Administered 2020-05-11 (×4): 5 mL

## 2020-05-11 MED ORDER — BUPIVACAINE LIPOSOME 1.3 % IJ SUSP
INTRAMUSCULAR | Status: AC
  Filled 2020-05-11: qty 20

## 2020-05-11 MED ORDER — LIDOCAINE HCL (PF) 2 % IJ SOLN
INTRAMUSCULAR | Status: AC
  Filled 2020-05-11: qty 5

## 2020-05-11 MED ORDER — GLYCOPYRROLATE 0.2 MG/ML IJ SOLN
INTRAMUSCULAR | Status: DC | PRN
  Administered 2020-05-11: .2 mg via INTRAVENOUS

## 2020-05-11 MED ORDER — CLINDAMYCIN PHOSPHATE 900 MG/50ML IV SOLN
INTRAVENOUS | Status: AC
  Filled 2020-05-11: qty 50

## 2020-05-11 MED ORDER — BUPIVACAINE HCL (PF) 0.5 % IJ SOLN
INTRAMUSCULAR | Status: AC
  Filled 2020-05-11: qty 10

## 2020-05-11 MED ORDER — LIDOCAINE HCL (CARDIAC) PF 100 MG/5ML IV SOSY
PREFILLED_SYRINGE | INTRAVENOUS | Status: DC | PRN
  Administered 2020-05-11: 50 mg via INTRAVENOUS

## 2020-05-11 MED ORDER — PROPOFOL 10 MG/ML IV BOLUS
INTRAVENOUS | Status: AC
  Filled 2020-05-11: qty 20

## 2020-05-11 MED ORDER — LACTATED RINGERS IV SOLN
INTRAVENOUS | Status: DC | PRN
  Administered 2020-05-11: 4 mL

## 2020-05-11 MED ORDER — PHENYLEPHRINE HCL (PRESSORS) 10 MG/ML IV SOLN
INTRAVENOUS | Status: DC | PRN
  Administered 2020-05-11 (×4): 100 ug via INTRAVENOUS

## 2020-05-11 MED ORDER — BUPIVACAINE-EPINEPHRINE (PF) 0.25% -1:200000 IJ SOLN
INTRAMUSCULAR | Status: AC
  Filled 2020-05-11: qty 30

## 2020-05-11 MED ORDER — LIDOCAINE HCL (PF) 1 % IJ SOLN
INTRAMUSCULAR | Status: AC
  Filled 2020-05-11: qty 5

## 2020-05-11 MED ORDER — DEXAMETHASONE SODIUM PHOSPHATE 10 MG/ML IJ SOLN
INTRAMUSCULAR | Status: DC | PRN
  Administered 2020-05-11: 10 mg via INTRAVENOUS

## 2020-05-11 SURGICAL SUPPLY — 74 items
ADAPTER IRRIG TUBE 2 SPIKE SOL (ADAPTER) ×4 IMPLANT
ADPR TBG 2 SPK PMP STRL ASCP (ADAPTER) ×2
ANCH SUT 2 TPE SLF PNCH BLK (Anchor) ×1 IMPLANT
ANCH SUT 2 TPE SLF PNCH BLU (Anchor) ×1 IMPLANT
ANCH SUT 4.75 2 THRD PEEK (Anchor) ×3 IMPLANT
ANCHOR SUT CROSSFT KNTLS 4.75 (Anchor) ×3 IMPLANT
ANCHOR YKNOT PRO RC BLUE TAPE (Anchor) ×1 IMPLANT
ANCHOR YKNOT PRO RC HI-FI TAPE (Anchor) ×1 IMPLANT
APL PRP STRL LF DISP 70% ISPRP (MISCELLANEOUS) ×1
BLADE FULL RADIUS 3.5 (BLADE) ×2 IMPLANT
BLADE INCISOR PLUS 4.5 (BLADE) ×2 IMPLANT
BLADE SURG MINI STRL (BLADE) ×2 IMPLANT
BRUSH SCRUB EZ  4% CHG (MISCELLANEOUS)
BRUSH SCRUB EZ 4% CHG (MISCELLANEOUS) ×1 IMPLANT
BUR ACROMIONIZER 4.0 (BURR) ×1 IMPLANT
BUR BR 5.5 WIDE MOUTH (BURR) IMPLANT
CANNULA SHOULDER 7CM (CANNULA) ×2 IMPLANT
CANNULA TWIST IN 8.25X7CM (CANNULA) ×2 IMPLANT
CHLORAPREP W/TINT 26 (MISCELLANEOUS) ×2 IMPLANT
COOLER POLAR GLACIER W/PUMP (MISCELLANEOUS) ×2 IMPLANT
COVER WAND RF STERILE (DRAPES) ×2 IMPLANT
DEVICE SUCT BLK HOLE OR FLOOR (MISCELLANEOUS) ×2 IMPLANT
DRAPE 3/4 80X56 (DRAPES) ×2 IMPLANT
DRAPE STERI 35X30 U-POUCH (DRAPES) ×2 IMPLANT
DRAPE U-SHAPE 47X51 STRL (DRAPES) ×2 IMPLANT
ELECT REM PT RETURN 9FT ADLT (ELECTROSURGICAL)
ELECTRODE REM PT RTRN 9FT ADLT (ELECTROSURGICAL) IMPLANT
GAUZE 4X4 16PLY RFD (DISPOSABLE) IMPLANT
GAUZE SPONGE 4X4 12PLY STRL (GAUZE/BANDAGES/DRESSINGS) ×2 IMPLANT
GAUZE XEROFORM 1X8 LF (GAUZE/BANDAGES/DRESSINGS) ×1 IMPLANT
GLOVE SRG 8 PF TXTR STRL LF DI (GLOVE) ×1 IMPLANT
GLOVE SURG ORTHO LTX SZ8 (GLOVE) ×2 IMPLANT
GLOVE SURG UNDER POLY LF SZ8 (GLOVE) ×2
GOWN STRL REUS W/ TWL LRG LVL3 (GOWN DISPOSABLE) ×1 IMPLANT
GOWN STRL REUS W/ TWL XL LVL3 (GOWN DISPOSABLE) ×1 IMPLANT
GOWN STRL REUS W/TWL LRG LVL3 (GOWN DISPOSABLE) ×2
GOWN STRL REUS W/TWL XL LVL3 (GOWN DISPOSABLE) ×2
IV LACTATED RINGER IRRG 3000ML (IV SOLUTION) ×18
IV LR IRRIG 3000ML ARTHROMATIC (IV SOLUTION) ×6 IMPLANT
KIT STABILIZATION SHOULDER (MISCELLANEOUS) ×2 IMPLANT
KIT TURNOVER KIT A (KITS) ×2 IMPLANT
MANIFOLD NEPTUNE II (INSTRUMENTS) ×4 IMPLANT
MASK FACE SPIDER DISP (MASK) ×2 IMPLANT
MAT ABSORB  FLUID 56X50 GRAY (MISCELLANEOUS) ×2
MAT ABSORB FLUID 56X50 GRAY (MISCELLANEOUS) ×1 IMPLANT
NDL SAFETY ECLIPSE 18X1.5 (NEEDLE) ×1 IMPLANT
NDL SCORPION MULTI FIRE (NEEDLE) IMPLANT
NDL SPNL 18GX3.5 QUINCKE PK (NEEDLE) ×1 IMPLANT
NEEDLE HYPO 18GX1.5 SHARP (NEEDLE) ×2
NEEDLE HYPO 22GX1.5 SAFETY (NEEDLE) ×2 IMPLANT
NEEDLE SCORPION MULTI FIRE (NEEDLE) ×2 IMPLANT
NEEDLE SPNL 18GX3.5 QUINCKE PK (NEEDLE) ×2 IMPLANT
PACK ARTHROSCOPY SHOULDER (MISCELLANEOUS) ×2 IMPLANT
PAD ABD DERMACEA PRESS 5X9 (GAUZE/BANDAGES/DRESSINGS) IMPLANT
PAD ARMBOARD 7.5X6 YLW CONV (MISCELLANEOUS) ×3 IMPLANT
PAD WRAPON POLAR SHDR XLG (MISCELLANEOUS) ×1 IMPLANT
SHEATH SHORT HANDLE 4.0 (SHEATH) ×1 IMPLANT
SLING ARM LRG DEEP (SOFTGOODS) IMPLANT
SLING ULTRA II M (MISCELLANEOUS) ×1 IMPLANT
STRAP SAFETY 5IN WIDE (MISCELLANEOUS) ×2 IMPLANT
SUT ETHILON NAB PS2 4-0 18IN (SUTURE) ×3 IMPLANT
SUT FIBERWIRE #2 38 T-5 BLUE (SUTURE)
SUT PDS AB 0 CT1 27 (SUTURE) ×2 IMPLANT
SUT PROLENE 0 CT 2 (SUTURE) ×2 IMPLANT
SUT TIGER TAPE 7 IN WHITE (SUTURE) IMPLANT
SUTURE FIBERWR #2 38 T-5 BLUE (SUTURE) IMPLANT
SYR 10ML LL (SYRINGE) ×2 IMPLANT
SYR 50ML LL SCALE MARK (SYRINGE) ×2 IMPLANT
TAPE HI-FI 2MM COBRAID WHT BLK (SUTURE) ×2 IMPLANT
TAPE MICROFOAM 4IN (TAPE) ×1 IMPLANT
TUBING ARTHRO INFLOW-ONLY STRL (TUBING) ×2 IMPLANT
TUBING CONNECTING 10 (TUBING) ×2 IMPLANT
WAND WEREWOLF FLOW 90D (MISCELLANEOUS) ×2 IMPLANT
WRAPON POLAR PAD SHDR XLG (MISCELLANEOUS) ×2

## 2020-05-11 NOTE — Anesthesia Procedure Notes (Signed)
Procedure Name: Intubation Performed by: Ferron Ishmael, CRNA Pre-anesthesia Checklist: Patient identified, Patient being monitored, Timeout performed, Emergency Drugs available and Suction available Patient Re-evaluated:Patient Re-evaluated prior to induction Oxygen Delivery Method: Circle system utilized Preoxygenation: Pre-oxygenation with 100% oxygen Induction Type: IV induction Ventilation: Mask ventilation without difficulty Laryngoscope Size: 3 and McGraph Grade View: Grade I Tube type: Oral Tube size: 7.0 mm Number of attempts: 1 Airway Equipment and Method: Stylet and Video-laryngoscopy Placement Confirmation: ETT inserted through vocal cords under direct vision,  positive ETCO2 and breath sounds checked- equal and bilateral Secured at: 21 cm Tube secured with: Tape Dental Injury: Teeth and Oropharynx as per pre-operative assessment        

## 2020-05-11 NOTE — Anesthesia Preprocedure Evaluation (Addendum)
Anesthesia Evaluation  Patient identified by MRN, date of birth, ID band Patient awake    Reviewed: Allergy & Precautions, H&P , NPO status , Patient's Chart, lab work & pertinent test results  History of Anesthesia Complications (+) PONV  Airway Mallampati: II  TM Distance: >3 FB     Dental  (+) Teeth Intact   Pulmonary asthma , neg sleep apnea, neg COPD,    breath sounds clear to auscultation       Cardiovascular hypertension, (-) angina(-) Past MI and (-) Cardiac Stents (-) dysrhythmias  Rhythm:regular Rate:Normal     Neuro/Psych PSYCHIATRIC DISORDERS Anxiety Depression negative neurological ROS     GI/Hepatic Neg liver ROS, GERD  Controlled,  Endo/Other  Hypothyroidism   Renal/GU      Musculoskeletal  (+) Arthritis , Fibromyalgia -  Abdominal   Peds  Hematology negative hematology ROS (+)   Anesthesia Other Findings Obese Appears very nervous  Past Medical History: No date: Anemia No date: Anxiety No date: Arthritis     Comment:  inflammitory No date: Asthma No date: B12 deficiency No date: Back pain No date: Chronic joint pain No date: Constipation No date: DDD (degenerative disc disease), lumbar     Comment:  low back and neck and shoulders No date: Depression     Comment:  during divorce & legal matters No date: Fatty liver No date: Fibromyalgia No date: Gallbladder problem No date: GERD (gastroesophageal reflux disease) No date: History of kidney stones     Comment:  Dr Phebe Colla S/P BSO: History of polycystic ovarian disease No date: Hx of anxiety disorder No date: Hypertension     Comment:  a No date: Hypothyroidism     Comment:  no meds No date: IBS (irritable bowel syndrome) No date: IBS (irritable bowel syndrome) No date: Infertility, female No date: Joint pain No date: Kidney problem No date: Lactose intolerance hx positive ANA: Lupus ([place])     Comment:  followed by Dr Gavin Pound; possible lupus No date: Multiple food allergies No date: Myalgia No date: Osteoarthritis 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, 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[place]) 03/02/2007: POLYCYSTIC OVARIAN DISEASE     Comment:  Qualifier: Diagnosis of  By: Linna Darner MD, Rae Mar               BSO for cysts & endometriosis; Dr Reino Kent, Gyn Seeing Dr               Paula Compton   No date: PONV (postoperative nausea and vomiting) No date: Pre-diabetes No date: Prediabetes No date: Sweating profusely No date: Symptomatic mammary hypertrophy No date: URI (upper respiratory infection)     Comment:  currently on cefdinir  Past Surgical History: No date: ABDOMINAL HYSTERECTOMY No date: ANKLE SURGERY     Comment:  X3 1991: APPENDECTOMY     Comment:  exploratory lap No date: BACK SURGERY 03/26/2015: BREAST REDUCTION SURGERY; Bilateral     Comment:  Procedure: BILATERAL BREAST REDUCTION ;  Surgeon: Wallace Going, DO;  Location: [place];                Service: Plastics;  Laterality: Bilateral; No date: CARPAL TUNNEL RELEASE     Comment:  bilateral; Dr Sherwood Gambler No date: COLONOSCOPY     Comment:  in 1990s 07-09-2004: CYSTO/ RIGHT RETROGRADE URETERAL PYELOGRAM     Comment:  HX BILATERAL RENAL STONES/ RIGHT FLANK PAIN 11/12/2011:  CYSTOSCOPY W/ URETERAL STENT PLACEMENT     Comment:  Procedure: CYSTOSCOPY WITH RETROGRADE PYELOGRAM/URETERAL              STENT PLACEMENT;  Surgeon: Alexis Frock, MD;  Location:              [facility];  Service: Urology;  Laterality: Right; 12/07/2011: CYSTOSCOPY W/ URETERAL STENT REMOVAL     Comment:  Procedure: CYSTOSCOPY WITH STENT REMOVAL;  Surgeon:               Alexis Frock, MD;  Location: [facility];  Service: Urology;  Laterality: Right; 12/07/2011: CYSTOSCOPY/RETROGRADE/URETEROSCOPY/STONE EXTRACTION WITH  BASKET     Comment:  Procedure: CYSTOSCOPY/RETROGRADE/URETEROSCOPY/STONE               EXTRACTION WITH BASKET;   Surgeon: Alexis Frock, MD;                Location: [facility];  Service: Urology;               Laterality: Right; No date: DILATION AND CURETTAGE OF UTERUS     Comment:  X5 02-05-2002: EXCISION LEFT BARTHOLIN GLAND No date: EYE SURGERY     Comment:  Retinal tears surgery bilateral 2000: LAPAROSCOPIC ASSISTED VAGINAL HYSTERECTOMY 05-31-2007: LAPAROSCOPIC CHOLECYSTECTOMY 11-03-1999: LAPAROSCOPIC LASER ABLATION ENDOMETRIOSIS AND LEFT  SALPINGO-OOPHORECTOMY 05-17-2001: LAPAROSCOPY WITH RIGHT SALPINGO-OOPHECTOMY/ LYSIS  ADHESIONS AND ABLATION ENDOMETRIOSIS 03/26/2015: LIPOSUCTION; Bilateral     Comment:  Procedure: WITH LIPOSUCTION;  Surgeon: Wallace Going, DO;  Location: [place];                Service: Plastics;  Laterality: Bilateral; 2003: LUMBAR LAMINECTOMY     Comment:  L4 - L5 2017: NECK SURGERY No date: PLANTAR FASCIA SURGERY     Comment:  right 02/2011: PLANTAR FASCIA SURGERY     Comment:  left No date: REDUCTION MAMMAPLASTY 08-04-2000: RIGHT URETEROSCOPIC STONE EXTRACTION No date: TONSILLECTOMY 11/12/11: URETERAL STENT PLACEMENT     Comment:  Dr Tresa Moore     Reproductive/Obstetrics negative OB ROS                            Anesthesia Physical Anesthesia Plan  ASA: II  Anesthesia Plan: General ETT   Post-op Pain Management: GA combined w/ Regional for post-op pain   Induction:   PONV Risk Score and Plan: Ondansetron, Dexamethasone, Midazolam and Treatment may vary due to age or medical condition  Airway Management Planned:   Additional Equipment:   Intra-op Plan:   Post-operative Plan:   Informed Consent: I have reviewed the patients History and Physical, chart, labs and discussed the procedure including the risks, benefits and alternatives for the proposed anesthesia with the patient or authorized representative who has indicated his/her understanding and acceptance.      Dental Advisory Given  Plan Discussed with: Anesthesiologist, CRNA and Surgeon  Anesthesia Plan Comments:         Anesthesia Quick Evaluation

## 2020-05-11 NOTE — H&P (Signed)
The patient has been re-examined, and the chart reviewed, and there have been no interval changes to the documented history and physical.  Plan a right shoulder scope today.  Anesthesia is consulted regarding a peripheral nerve block for post-operative pain.  The risks, benefits, and alternatives have been discussed at length, and the patient is willing to proceed.    

## 2020-05-11 NOTE — Discharge Instructions (Signed)
Wear sling at all times, including sleep.  You will need to use the sling for a total of 4 weeks following surgery.  Do not try and lift your arm up or away from your body for any reason.   Keep the dressing dry.  You may remove bandage in 3 days.  You may place Band-Aids over top of the incisions.  May shower once dressing is removed in 3 days.  Remove sling carefully only for showers, leaving arm down by your side while in the shower.  +++ Make sure to take some pain medication this evening before you fall asleep, in preparation for the nerve block wearing off in the middle of the night.  If the the pain medication causes itching, or is too strong, try taking a single tablet at a time, or combining with Benadryl.  You may be most comfortable sleeping in a recliner.  If you do sleep in near bed, placed pillows behind the shoulder that have the operation to support it.   AMBULATORY SURGERY  DISCHARGE INSTRUCTIONS   1) The drugs that you were given will stay in your system until tomorrow so for the next 24 hours you should not:  A) Drive an automobile B) Make any legal decisions C) Drink any alcoholic beverage   2) You may resume regular meals tomorrow.  Today it is better to start with liquids and gradually work up to solid foods.  You may eat anything you prefer, but it is better to start with liquids, then soup and crackers, and gradually work up to solid foods.   3) Please notify your doctor immediately if you have any unusual bleeding, trouble breathing, redness and pain at the surgery site, drainage, fever, or pain not relieved by medication.  4) Your post-operative visit with Dr.                                     is: Date:                        Time:    Please call to schedule your post-operative visit.  5) Additional Instructions:   Bupivacaine Liposomal Suspension for Injection What is this medicine? BUPIVACAINE LIPOSOMAL (bue PIV a kane LIP oh som al) is an  anesthetic. It causes loss of feeling in the skin or other tissues. It is used to prevent and to treat pain from some procedures. This medicine may be used for other purposes; ask your health care provider or pharmacist if you have questions. COMMON BRAND NAME(S): EXPAREL What should I tell my health care provider before I take this medicine? They need to know if you have any of these conditions:  G6PD deficiency  heart disease  kidney disease  liver disease  low blood pressure  lung or breathing disease, like asthma  an unusual or allergic reaction to bupivacaine, other medicines, foods, dyes, or preservatives  pregnant or trying to get pregnant  breast-feeding How should I use this medicine? This medicine is injected into the affected area. It is given by a health care provider in a hospital or clinic setting. Talk to your health care provider about the use of this medicine in children. While it may be given to children as young as 6 years for selected conditions, precautions do apply. Overdosage: If you think you have taken too much of this medicine contact  a poison control center or emergency room at once. NOTE: This medicine is only for you. Do not share this medicine with others. What if I miss a dose? This does not apply. What may interact with this medicine? This medicine may interact with the following medications:  acetaminophen  certain antibiotics like dapsone, nitrofurantoin, aminosalicylic acid, sulfonamides  certain medicines for seizures like phenobarbital, phenytoin, valproic acid  chloroquine  cyclophosphamide  flutamide  hydroxyurea  ifosfamide  metoclopramide  nitric oxide  nitroglycerin  nitroprusside  nitrous oxide  other local anesthetics like lidocaine, pramoxine, tetracaine  primaquine  quinine  rasburicase  sulfasalazine This list may not describe all possible interactions. Give your health care provider a list of all the  medicines, herbs, non-prescription drugs, or dietary supplements you use. Also tell them if you smoke, drink alcohol, or use illegal drugs. Some items may interact with your medicine. What should I watch for while using this medicine? Your condition will be monitored carefully while you are receiving this medicine. Be careful to avoid injury while the area is numb, and you are not aware of pain. What side effects may I notice from receiving this medicine? Side effects that you should report to your doctor or health care professional as soon as possible:  allergic reactions like skin rash, itching or hives, swelling of the face, lips, or tongue  seizures  signs and symptoms of a dangerous change in heartbeat or heart rhythm like chest pain; dizziness; fast, irregular heartbeat; palpitations; feeling faint or lightheaded; falls; breathing problems  signs and symptoms of methemoglobinemia such as pale, gray, or blue colored skin; headache; fast heartbeat; shortness of breath; feeling faint or lightheaded, falls; tiredness Side effects that usually do not require medical attention (report to your doctor or health care professional if they continue or are bothersome):  anxious  back pain  changes in taste  changes in vision  constipation  dizziness  fever  nausea, vomiting This list may not describe all possible side effects. Call your doctor for medical advice about side effects. You may report side effects to FDA at 1-800-FDA-1088. Where should I keep my medicine? This drug is given in a hospital or clinic and will not be stored at home. NOTE: This sheet is a summary. It may not cover all possible information. If you have questions about this medicine, talk to your doctor, pharmacist, or health care provider.  2021 Elsevier/Gold Standard (2019-06-27 12:24:57)

## 2020-05-11 NOTE — Progress Notes (Addendum)
Informed dr Harlow Mares of swelling noted to right upper arm distal to dressing as well as drainage that has increased while in pacu/post op seen on dressing.  Per dr Harlow Mares swelling from irrigation fluid and for RN to change dressing.  Dressing changed with sterile gauze/abd pads.  Paper tape used as pt has allergy.  Vaseline gauze used as well per dr Harlow Mares.  Pt and husband educated on reasons to call the office.  Swelling was marked with skin marker.

## 2020-05-11 NOTE — Op Note (Signed)
05/11/2020  2:59 PM  PATIENT:  [name]  50 y.o. 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    The risks benefits and alternatives were discussed with the patient preoperatively including but not limited to the risks of infection, bleeding, nerve injury, persistent pain or weakness, failure of the hardware, re-tear of the rotator cuff and the need for further surgery. Medical risks include DVT and pulmonary embolism, myocardial infarction, stroke, pneumonia, respiratory failure and death. Patient understood these risks and wished to proceed.  OPERATIVE IMPLANTS: Conmed Suture Bridge with 2 medial Y-Knot anchors and 3 lateral crossFT anchors  OPERATIVE PROCEDURE: The patient was met in the preoperative area. The right shoulder was signed with my initials according the hospital's correct site of surgery protocol. The patient is brought to the OR and underwent a supraclavicular block and general endotracheal intubation by the anesthesia service.  The patient was placed in a beachchair position.  A spider arm positioner was used for this case. Examination under anesthesia revealed full passive ROM and a negative sulcus sign. There was anterior/posterior instability.  The patient was prepped and draped in a sterile fashion. Alcohol prep was used given her multiple allergies. A timeout was performed to verify  the patient's name, date of birth, medical record number, correct site of surgery and correct procedure to be performed there was also used to verify the patient received antibiotics that all appropriate instruments, implants and radiographs studies were available in the room. Once all in attendance were in agreement case began.  Bony landmarks were drawn out with a surgical marker along with proposed arthroscopy incisions. These were pre-injected with 0.25% marcaine with epi. An 11 blade was used to establish a posterior portal through which the arthroscope was placed in the glenohumeral joint. A full diagnostic examination of the shoulder was performed.  The anterior portal was established under direct visualization with an 18-gauge spinal needle.  A 5.75 mm arthroscopic cannula was placed through the anterior portal.   A prior tenodesis had been performed of the biceps tendon. The arthroscopic shaver was then used to debride the frayed edges of the labrum. There were no anterior or superior labral tears seen.  Subscapularis tendon was intact. Patient had a full-thickness tear involving the supraspinatus and infraspinatus with retraction. There were no loose bodies within the inferior recess and no evidence of HAGL lesion. There was multiple pieces of suture within the subacromial space and scarring of the retracted rotator cuff. Extensive bursal tissue was identified.  The arthroscope was then placed in the subacromial space. A lateral portal was then established using an 18-gauge spinal needle for localization.   The greater tuberosity was debrided using a 5.5 mm resector shaver blade to remove all remaining foreign fibers of the rotator cuff.  Debridement was performed until punctate bleeding was seen at the greater tuberosity footprint, which will allow for rotator cuff healing.  Extensive bursitis was encountered and debrided  using a 4-0 resector shaver blade and a 90 ArthroCare wand from the lateral  portal. Using the a double row suture bridge system medial anchors with fiber tape were placed. The cuff was mobilized and the tape passed through the rotator cuff. The tape was then crossed in usual fashion and fixated on the lateral side with two SwiveLock anchors. An additional crossFT anchor was placed anteriorly with a fiber tape horizontal stitch. The final construct was stable and moved as a unit with excellent coverage of the humeral head.  Final arthroscopic images were taken. Arthroscopic images were then removed.  All incisions were copiously irrigated. Skin closure for the arthroscopic incisions was performed with 3-0 nylon.  A dry sterile dressing including Steri-Strips was applied .  The patient was placed in an abduction sling.  All sharp and instrument counts were correct at the conclusion of the case. I was scrubbed and present for the entire case. I spoke with the patient's family in the post-op consultation room and informed them that the case had been performed without complication and the patient was stable in recovery room.   Kurtis Bushman, MD

## 2020-05-11 NOTE — Anesthesia Procedure Notes (Signed)
Anesthesia Regional Block: Interscalene brachial plexus block   Pre-Anesthetic Checklist: ,, timeout performed, Correct Patient, Correct Site, Correct Laterality, Correct Procedure, Correct Position, site marked, Risks and benefits discussed,  Surgical consent,  Pre-op evaluation,  At surgeon's request and post-op pain management  Laterality: Right  Prep: alcohol swabs       Needles:   Needle Type: Stimiplex     Needle Length: 9cm  Needle Gauge: 21     Additional Needles:   Procedures:,,,, ultrasound used (permanent image in chart),,,,  Narrative:  Start time: 05/11/2020 12:09 PM End time: 05/11/2020 12:12 PM  Performed by: Personally  Anesthesiologist: Tera Mater, MD  Additional Notes: Risks and benefits of nerve block discussed with patient, including but not limited to risk of nerve injury, bleeding, infection, and failed block.  Patient expressed understanding and consented to block placement.   Functioning IV was confirmed and monitors were applied.  Sterile prep,hand hygiene and sterile gloves were used.  Minimal sedation used for procedure.  During the procedure, there was negative aspiration, negative paresthesia on injection, and dose was given in divided aliquots under ultrasound guidance.  Patient tolerated the procedure well with no immediate complications.

## 2020-05-11 NOTE — Transfer of Care (Signed)
Immediate Anesthesia Transfer of Care Note  Patient: [name]  Procedure(s) Performed: SHOULDER ARTHROSCOPY WITH ROTATOR CUFF REPAIR (Right )  Patient Location: PACU  Anesthesia Type:General  Level of Consciousness: awake  Airway & Oxygen Therapy: Patient connected to face mask oxygen  Post-op Assessment: Post -op Vital signs reviewed and stable  Post vital signs: stable  Last Vitals:  Vitals Value Taken Time  BP 99/88 05/11/20 1511  Temp    Pulse 92 05/11/20 1515  Resp 12 05/11/20 1515  SpO2 96 % 05/11/20 1515  Vitals shown include unvalidated device data.  Last Pain:  Vitals:   05/11/20 1026  TempSrc: Oral  PainSc: 4          Complications: No complications documented.

## 2020-05-12 ENCOUNTER — Encounter: Payer: Self-pay | Admitting: Orthopedic Surgery

## 2020-05-13 NOTE — Anesthesia Postprocedure Evaluation (Signed)
Anesthesia Post Note  Patient: [name]  Procedure(s) Performed: SHOULDER ARTHROSCOPY WITH ROTATOR CUFF REPAIR (Right )  Patient location during evaluation: PACU Anesthesia Type: General Level of consciousness: awake and alert Pain management: pain level controlled Vital Signs Assessment: post-procedure vital signs reviewed and stable Respiratory status: spontaneous breathing, nonlabored ventilation and respiratory function stable Cardiovascular status: blood pressure returned to baseline and stable Postop Assessment: no apparent nausea or vomiting Anesthetic complications: no   No complications documented.   Last Vitals:  Vitals:   05/11/20 1550 05/11/20 1647  BP:  (!) 116/7  Pulse: 80 79  Resp: 17 18  Temp:  37.1 C  SpO2: 95% 97%    Last Pain:  Vitals:   05/12/20 0908  TempSrc:   PainSc: 0-No pain                 Brett Canales Ramsdell

## 2020-05-18 ENCOUNTER — Ambulatory Visit (INDEPENDENT_AMBULATORY_CARE_PROVIDER_SITE_OTHER): Payer: PPO | Admitting: Family Medicine

## 2020-05-18 DIAGNOSIS — M25611 Stiffness of right shoulder, not elsewhere classified: Secondary | ICD-10-CM | POA: Diagnosis not present

## 2020-05-18 DIAGNOSIS — M25511 Pain in right shoulder: Secondary | ICD-10-CM | POA: Diagnosis not present

## 2020-05-20 DIAGNOSIS — M25511 Pain in right shoulder: Secondary | ICD-10-CM | POA: Diagnosis not present

## 2020-05-20 DIAGNOSIS — M25611 Stiffness of right shoulder, not elsewhere classified: Secondary | ICD-10-CM | POA: Diagnosis not present

## 2020-05-27 ENCOUNTER — Telehealth: Payer: Self-pay | Admitting: Internal Medicine

## 2020-05-27 NOTE — Progress Notes (Signed)
°  Chronic Care Management   Note  05/27/2020 Name: [name] MRN: [id number] DOB: [birth year]  [name] is a 50 y.o. year old female who is a primary care patient of Burns, Claudina Lick, MD. I reached out to Karie Mainland by phone today in response to a referral sent by [name] PCP, Binnie Rail, MD.   [name] was given information about Chronic Care Management services today including:  1. CCM service includes personalized support from designated clinical staff supervised by her physician, including individualized plan of care and coordination with other care providers 2. 24/7 contact phone numbers for assistance for urgent and routine care needs. 3. Service will only be billed when office clinical staff spend 20 minutes or more in a month to coordinate care. 4. Only one practitioner may furnish and bill the service in a calendar month. 5. The patient may stop CCM services at any time (effective at the end of the month) by phone call to the office staff.   Patient agreed to services and verbal consent obtained.   Follow up plan:   Carley Perdue [employer] Scheduler

## 2020-05-29 DIAGNOSIS — M25611 Stiffness of right shoulder, not elsewhere classified: Secondary | ICD-10-CM | POA: Diagnosis not present

## 2020-05-29 DIAGNOSIS — M25511 Pain in right shoulder: Secondary | ICD-10-CM | POA: Diagnosis not present

## 2020-06-01 DIAGNOSIS — M25611 Stiffness of right shoulder, not elsewhere classified: Secondary | ICD-10-CM | POA: Diagnosis not present

## 2020-06-01 DIAGNOSIS — M25511 Pain in right shoulder: Secondary | ICD-10-CM | POA: Diagnosis not present

## 2020-06-03 DIAGNOSIS — M25611 Stiffness of right shoulder, not elsewhere classified: Secondary | ICD-10-CM | POA: Diagnosis not present

## 2020-06-03 DIAGNOSIS — M25511 Pain in right shoulder: Secondary | ICD-10-CM | POA: Diagnosis not present

## 2020-06-08 DIAGNOSIS — M25511 Pain in right shoulder: Secondary | ICD-10-CM | POA: Diagnosis not present

## 2020-06-08 DIAGNOSIS — M25611 Stiffness of right shoulder, not elsewhere classified: Secondary | ICD-10-CM | POA: Diagnosis not present

## 2020-06-10 ENCOUNTER — Other Ambulatory Visit: Payer: Self-pay

## 2020-06-10 ENCOUNTER — Ambulatory Visit (INDEPENDENT_AMBULATORY_CARE_PROVIDER_SITE_OTHER): Payer: PPO | Admitting: Family Medicine

## 2020-06-10 ENCOUNTER — Encounter (INDEPENDENT_AMBULATORY_CARE_PROVIDER_SITE_OTHER): Payer: Self-pay | Admitting: Family Medicine

## 2020-06-10 VITALS — BP 121/78 | HR 85 | Temp 98.6°F | Ht 68.0 in | Wt 235.0 lb

## 2020-06-10 DIAGNOSIS — E8881 Metabolic syndrome: Secondary | ICD-10-CM | POA: Diagnosis not present

## 2020-06-10 DIAGNOSIS — F3289 Other specified depressive episodes: Secondary | ICD-10-CM

## 2020-06-10 DIAGNOSIS — Z6835 Body mass index (BMI) 35.0-35.9, adult: Secondary | ICD-10-CM

## 2020-06-10 MED ORDER — TOPIRAMATE 50 MG PO TABS: 50.0000 mg | ORAL_TABLET | Freq: Every day | ORAL | 0 refills | Status: DC

## 2020-06-10 MED ORDER — METFORMIN HCL 500 MG PO TABS
500.0000 mg | ORAL_TABLET | Freq: Two times a day (BID) | ORAL | 0 refills | Status: DC
Start: 2020-06-10 — End: 2020-07-16

## 2020-06-10 MED ORDER — ONETOUCH DELICA LANCETS 30G MISC: 1.0000 | Freq: Two times a day (BID) | 0 refills | Status: DC

## 2020-06-10 MED ORDER — BUPROPION HCL ER (SR) 150 MG PO TB12: 150.0000 mg | ORAL_TABLET | Freq: Every day | ORAL | 0 refills | Status: DC

## 2020-06-12 DIAGNOSIS — M25611 Stiffness of right shoulder, not elsewhere classified: Secondary | ICD-10-CM | POA: Diagnosis not present

## 2020-06-12 DIAGNOSIS — M25511 Pain in right shoulder: Secondary | ICD-10-CM | POA: Diagnosis not present

## 2020-06-15 ENCOUNTER — Other Ambulatory Visit: Payer: Self-pay | Admitting: Internal Medicine

## 2020-06-15 DIAGNOSIS — M25611 Stiffness of right shoulder, not elsewhere classified: Secondary | ICD-10-CM | POA: Diagnosis not present

## 2020-06-15 DIAGNOSIS — M25511 Pain in right shoulder: Secondary | ICD-10-CM | POA: Diagnosis not present

## 2020-06-16 NOTE — Progress Notes (Signed)
Chief Complaint:   OBESITY [name] is here to discuss her progress with her obesity treatment plan along with follow-up of her obesity related diagnoses. [name] is on the Category 3 Plan and states she is following her eating plan approximately 0% of the time. [name] states she is doing 0 minutes 0 times per week.  Today's visit was #: 78 Starting weight: 241 lbs Starting date: 09/28/2016 Today's weight: 235 lbs Today's date: 06/10/2020 Total lbs lost to date: 6 Total lbs lost since last in-office visit: 1  Interim History: [name] is recovering from a right rotator cuff repair. She has has decreased appetite and she hasn't eaten much. Her protein intake has also fallen and may be starting to decrease her RMR.  Subjective:   1. Insulin resistance [name] is stable on metformin, but she has questions about how this could affect her kidneys.  2. Other depression with emotional eating [name] is stable on Wellbutrin, and she requests a refill today.  Assessment/Plan:   1. Insulin resistance [name] will continue to work on weight loss, exercise, and decreasing simple carbohydrates to help decrease the risk of diabetes. We will refill metformin for 1 month, and we will refill lancets #100 with no refills. Metformin information handout was given to the patient today. [name] agreed to follow-up with [place] as directed to closely monitor her progress.  - metFORMIN (GLUCOPHAGE) 500 MG tablet; Take 1 tablet (500 mg total) by mouth 2 (two) times daily with a meal.  Dispense: 60 tablet; Refill: 0 - OneTouch Delica Lancets 73A MISC; 1 each by Does not apply route 2 (two) times daily.  Dispense: 100 each; Refill: 0  2. Other depression with emotional eating Behavior modification techniques were discussed today to help [name] deal with her emotional/non-hunger eating behaviors. We will refill both, Topamax and Wellbutrin SR for 1 month. Orders and follow up as documented in patient  record.   - buPROPion (WELLBUTRIN SR) 150 MG 12 hr tablet; Take 1 tablet (150 mg total) by mouth daily.  Dispense: 30 tablet; Refill: 0 - topiramate (TOPAMAX) 50 MG tablet; Take 1 tablet (50 mg total) by mouth at bedtime.  Dispense: 30 tablet; Refill: 0  3. Class 2 severe obesity with serious comorbidity and body mass index (BMI) of 35.0 to 35.9 in adult, unspecified obesity type [name]) [name] is currently in the action stage of change. As such, her goal is to continue with weight loss efforts. She has agreed to the Category 3 Plan.   Behavioral modification strategies: increasing lean protein intake.  [name] has agreed to follow-up with our clinic in 2 weeks. She was informed of the importance of frequent follow-up visits to maximize her success with intensive lifestyle modifications for her multiple health conditions.   Objective:   Blood pressure 121/78, pulse 85, temperature 98.6 F (37 C), height 5\' 8"  (1.727 m), weight 235 lb (106.6 kg), SpO2 99 %. Body mass index is 35.73 kg/m.  General: Cooperative, alert, well developed, in no acute distress. HEENT: Conjunctivae and lids unremarkable. Cardiovascular: Regular rhythm.  Lungs: Normal work of breathing. Neurologic: No focal deficits.   Lab Results  Component Value Date   CREATININE 0.95 04/28/2020   BUN 32 (H) 04/28/2020   NA 139 04/28/2020   K 4.1 04/28/2020   CL 104 04/28/2020   CO2 27 04/28/2020   Lab Results  Component Value Date   ALT 21 04/28/2020   AST 19 04/28/2020   ALKPHOS 84 04/28/2020   BILITOT  0.3 04/28/2020   Lab Results  Component Value Date   HGBA1C 5.9 04/28/2020   HGBA1C 5.5 11/11/2019   HGBA1C 5.3 07/08/2019   HGBA1C 5.4 02/21/2019   HGBA1C 5.2 10/29/2018   Lab Results  Component Value Date   INSULIN 25.1 (H) 04/30/2020   INSULIN 16.4 11/11/2019   INSULIN 17.3 02/21/2019   INSULIN 14.1 10/29/2018   INSULIN 17.8 05/03/2018   Lab Results  Component Value Date   TSH 1.800 11/11/2019    Lab Results  Component Value Date   CHOL 152 11/11/2019   HDL 58 11/11/2019   LDLCALC 72 11/11/2019   TRIG 122 11/11/2019   CHOLHDL 3 08/16/2016   Lab Results  Component Value Date   WBC 13.3 (H) 04/28/2020   HGB 13.6 04/28/2020   HCT 42.2 04/28/2020   MCV 84.6 04/28/2020   PLT 329.0 04/28/2020   No results found for: IRON, TIBC, FERRITIN  Obesity Behavioral Intervention:   Approximately 15 minutes were spent on the discussion below.  ASK: We discussed the diagnosis of obesity with [name] today and [name] agreed to give [place] permission to discuss obesity behavioral modification therapy today.  ASSESS: [name] has the diagnosis of obesity and her BMI today is 35.74. [name] is in the action stage of change.   ADVISE: [name] was educated on the multiple health risks of obesity as well as the benefit of weight loss to improve her health. She was advised of the need for long term treatment and the importance of lifestyle modifications to improve her current health and to decrease her risk of future health problems.  AGREE: Multiple dietary modification options and treatment options were discussed and [name] agreed to follow the recommendations documented in the above note.  ARRANGE: [name] was educated on the importance of frequent visits to treat obesity as outlined per CMS and USPSTF guidelines and agreed to schedule her next follow up appointment today.  Attestation Statements:   Reviewed by clinician on day of visit: allergies, medications, problem list, medical history, surgical history, family history, social history, and previous encounter notes.   I, Trixie Dredge, am acting as transcriptionist for Dennard Nip, MD.  I have reviewed the above documentation for accuracy and completeness, and I agree with the above. -  Dennard Nip, MD

## 2020-06-18 DIAGNOSIS — H5213 Myopia, bilateral: Secondary | ICD-10-CM | POA: Diagnosis not present

## 2020-06-18 DIAGNOSIS — Z79899 Other long term (current) drug therapy: Secondary | ICD-10-CM | POA: Diagnosis not present

## 2020-06-18 DIAGNOSIS — H52223 Regular astigmatism, bilateral: Secondary | ICD-10-CM | POA: Diagnosis not present

## 2020-06-19 DIAGNOSIS — M25611 Stiffness of right shoulder, not elsewhere classified: Secondary | ICD-10-CM | POA: Diagnosis not present

## 2020-06-19 DIAGNOSIS — M25511 Pain in right shoulder: Secondary | ICD-10-CM | POA: Diagnosis not present

## 2020-06-24 DIAGNOSIS — M25511 Pain in right shoulder: Secondary | ICD-10-CM | POA: Diagnosis not present

## 2020-06-24 DIAGNOSIS — M25611 Stiffness of right shoulder, not elsewhere classified: Secondary | ICD-10-CM | POA: Diagnosis not present

## 2020-06-25 ENCOUNTER — Encounter: Payer: Self-pay | Admitting: Internal Medicine

## 2020-06-26 DIAGNOSIS — M25511 Pain in right shoulder: Secondary | ICD-10-CM | POA: Diagnosis not present

## 2020-06-26 DIAGNOSIS — M25611 Stiffness of right shoulder, not elsewhere classified: Secondary | ICD-10-CM | POA: Diagnosis not present

## 2020-06-29 DIAGNOSIS — M25611 Stiffness of right shoulder, not elsewhere classified: Secondary | ICD-10-CM | POA: Diagnosis not present

## 2020-06-29 DIAGNOSIS — M25511 Pain in right shoulder: Secondary | ICD-10-CM | POA: Diagnosis not present

## 2020-07-01 DIAGNOSIS — M25511 Pain in right shoulder: Secondary | ICD-10-CM | POA: Diagnosis not present

## 2020-07-01 DIAGNOSIS — M25611 Stiffness of right shoulder, not elsewhere classified: Secondary | ICD-10-CM | POA: Diagnosis not present

## 2020-07-02 ENCOUNTER — Other Ambulatory Visit (INDEPENDENT_AMBULATORY_CARE_PROVIDER_SITE_OTHER): Payer: Self-pay | Admitting: Family Medicine

## 2020-07-02 DIAGNOSIS — F3289 Other specified depressive episodes: Secondary | ICD-10-CM

## 2020-07-02 DIAGNOSIS — E8881 Metabolic syndrome: Secondary | ICD-10-CM

## 2020-07-07 ENCOUNTER — Other Ambulatory Visit (INDEPENDENT_AMBULATORY_CARE_PROVIDER_SITE_OTHER): Payer: Self-pay | Admitting: Adult Health

## 2020-07-07 ENCOUNTER — Other Ambulatory Visit: Payer: Self-pay | Admitting: Internal Medicine

## 2020-07-07 DIAGNOSIS — F3289 Other specified depressive episodes: Secondary | ICD-10-CM

## 2020-07-07 DIAGNOSIS — Z1231 Encounter for screening mammogram for malignant neoplasm of breast: Secondary | ICD-10-CM

## 2020-07-07 DIAGNOSIS — R600 Localized edema: Secondary | ICD-10-CM | POA: Diagnosis not present

## 2020-07-07 DIAGNOSIS — M25511 Pain in right shoulder: Secondary | ICD-10-CM | POA: Diagnosis not present

## 2020-07-07 DIAGNOSIS — M25611 Stiffness of right shoulder, not elsewhere classified: Secondary | ICD-10-CM | POA: Diagnosis not present

## 2020-07-07 MED ORDER — TOPIRAMATE 50 MG PO TABS
50.0000 mg | ORAL_TABLET | Freq: Every day | ORAL | 0 refills | Status: DC
Start: 2020-07-07 — End: 2020-08-13

## 2020-07-07 NOTE — Telephone Encounter (Signed)
Advise what exactly? Sincerely, Valetta Fuller

## 2020-07-07 NOTE — Telephone Encounter (Signed)
Please advise in Dr. Migdalia Dk absence.

## 2020-07-09 DIAGNOSIS — M25511 Pain in right shoulder: Secondary | ICD-10-CM | POA: Diagnosis not present

## 2020-07-09 DIAGNOSIS — M25611 Stiffness of right shoulder, not elsewhere classified: Secondary | ICD-10-CM | POA: Diagnosis not present

## 2020-07-14 DIAGNOSIS — M25611 Stiffness of right shoulder, not elsewhere classified: Secondary | ICD-10-CM | POA: Diagnosis not present

## 2020-07-14 DIAGNOSIS — R600 Localized edema: Secondary | ICD-10-CM | POA: Diagnosis not present

## 2020-07-16 ENCOUNTER — Encounter (INDEPENDENT_AMBULATORY_CARE_PROVIDER_SITE_OTHER): Payer: Self-pay | Admitting: Family Medicine

## 2020-07-16 ENCOUNTER — Other Ambulatory Visit: Payer: Self-pay

## 2020-07-16 ENCOUNTER — Ambulatory Visit (INDEPENDENT_AMBULATORY_CARE_PROVIDER_SITE_OTHER): Payer: PPO | Admitting: Family Medicine

## 2020-07-16 VITALS — BP 130/83 | HR 84 | Temp 98.3°F | Ht 68.0 in | Wt 234.0 lb

## 2020-07-16 DIAGNOSIS — R7303 Prediabetes: Secondary | ICD-10-CM

## 2020-07-16 DIAGNOSIS — Z6836 Body mass index (BMI) 36.0-36.9, adult: Secondary | ICD-10-CM | POA: Diagnosis not present

## 2020-07-16 DIAGNOSIS — E8881 Metabolic syndrome: Secondary | ICD-10-CM | POA: Diagnosis not present

## 2020-07-16 DIAGNOSIS — K588 Other irritable bowel syndrome: Secondary | ICD-10-CM

## 2020-07-16 MED ORDER — METFORMIN HCL 500 MG PO TABS: 500.0000 mg | ORAL_TABLET | Freq: Two times a day (BID) | ORAL | 0 refills | Status: DC

## 2020-07-16 MED ORDER — BD PEN NEEDLE NANO U/F 32G X 4 MM MISC: 1.0000 | Freq: Every day | 0 refills | Status: DC

## 2020-07-23 NOTE — Progress Notes (Signed)
Chief Complaint:   OBESITY Lener is here to discuss her progress with her obesity treatment plan along with follow-up of her obesity related diagnoses. Magdelena is on the Category 3 Plan and states she is following her eating plan approximately 75% of the time. Lyanna states she is doing 0 minutes 0 times per week.  Today's visit was #: 48 Starting weight: 241 lbs Starting date: 09/28/2016 Today's weight: 234 lbs Today's date: 07/16/2020 Total lbs lost to date: 7 Total lbs lost since last in-office visit: 1  Interim History: Francene has done well maintaining her weight. Her IBS has flared up and she feels nauseated and bloated. This makes exercise difficult and following her eating plan challenging.   Subjective:   1. Insulin resistance Genni is on metformin and Victoza. She has tolerated this well previously, but she is now having increased GI upset.  2. Other irritable bowel syndrome Marisol notes increased GI upset, which may be due in part to her diet, but could be her IBS flaring up. She notes increased diarrhea currently.  Assessment/Plan:   1. Insulin resistance Zamariyah will continue to work on weight loss, exercise, and decreasing simple carbohydrates to help decrease the risk of diabetes. We will refill metformin for 1 month, and we will refill pen needles #100 with no refills. Taelyn agreed to decrease Victoza to 1.2 mg, and she will change to a lower carbohydrate plan. She agreed to follow-up with [place] as directed to closely monitor her progress.  - metFORMIN (GLUCOPHAGE) 500 MG tablet; Take 1 tablet (500 mg total) by mouth 2 (two) times daily with a meal.  Dispense: 60 tablet; Refill: 0 - Insulin Pen Needle (BD PEN NEEDLE NANO U/F) 32G X 4 MM MISC; 1 each by Other route daily.  Dispense: 100 each; Refill: 0  2. Other irritable bowel syndrome Gwyndolyn was encouraged to decrease simple carbohydrates and her medications were adjusted. She was encouraged to  see her GI doctor if no improvement.  3. Oesity with current BMI of 35.6 Alannah is currently in the action stage of change. As such, her goal is to continue with weight loss efforts. She has agreed to change to following a lower carbohydrate, vegetable and lean protein rich diet plan.   Behavioral modification strategies: increasing lean protein intake and increasing water intake.  Ashaunti has agreed to follow-up with our clinic in 3 to 4 weeks. She was informed of the importance of frequent follow-up visits to maximize her success with intensive lifestyle modifications for her multiple health conditions.   Objective:   Blood pressure 130/83, pulse 84, temperature 98.3 F (36.8 C), height 5\' 8"  (1.727 m), weight 234 lb (106.1 kg), SpO2 98 %. Body mass index is 35.58 kg/m.  General: Cooperative, alert, well developed, in no acute distress. HEENT: Conjunctivae and lids unremarkable. Cardiovascular: Regular rhythm.  Lungs: Normal work of breathing. Neurologic: No focal deficits.   Lab Results  Component Value Date   CREATININE 0.95 04/28/2020   BUN 32 (H) 04/28/2020   NA 139 04/28/2020   K 4.1 04/28/2020   CL 104 04/28/2020   CO2 27 04/28/2020   Lab Results  Component Value Date   ALT 21 04/28/2020   AST 19 04/28/2020   ALKPHOS 84 04/28/2020   BILITOT 0.3 04/28/2020   Lab Results  Component Value Date   HGBA1C 5.9 04/28/2020   HGBA1C 5.5 11/11/2019   HGBA1C 5.3 07/08/2019   HGBA1C 5.4 02/21/2019   HGBA1C 5.2 10/29/2018  Lab Results  Component Value Date   INSULIN 25.1 (H) 04/30/2020   INSULIN 16.4 11/11/2019   INSULIN 17.3 02/21/2019   INSULIN 14.1 10/29/2018   INSULIN 17.8 05/03/2018   Lab Results  Component Value Date   TSH 1.800 11/11/2019   Lab Results  Component Value Date   CHOL 152 11/11/2019   HDL 58 11/11/2019   LDLCALC 72 11/11/2019   TRIG 122 11/11/2019   CHOLHDL 3 08/16/2016   Lab Results  Component Value Date   WBC 13.3 (H) 04/28/2020    HGB 13.6 04/28/2020   HCT 42.2 04/28/2020   MCV 84.6 04/28/2020   PLT 329.0 04/28/2020   No results found for: IRON, TIBC, FERRITIN  Obesity Behavioral Intervention:   Approximately 15 minutes were spent on the discussion below.  ASK: We discussed the diagnosis of obesity with [name] today and Gwynne agreed to give [place] permission to discuss obesity behavioral modification therapy today.  ASSESS: Jenean has the diagnosis of obesity and her BMI today is 35.59. Kalilah is in the action stage of change.   ADVISE: Nazly was educated on the multiple health risks of obesity as well as the benefit of weight loss to improve her health. She was advised of the need for long term treatment and the importance of lifestyle modifications to improve her current health and to decrease her risk of future health problems.  AGREE: Multiple dietary modification options and treatment options were discussed and Anayansi agreed to follow the recommendations documented in the above note.  ARRANGE: Milana was educated on the importance of frequent visits to treat obesity as outlined per CMS and USPSTF guidelines and agreed to schedule her next follow up appointment today.  Attestation Statements:   Reviewed by clinician on day of visit: allergies, medications, problem list, medical history, surgical history, family history, social history, and previous encounter notes.   I, Trixie Dredge, am acting as transcriptionist for Dennard Nip, MD.  I have reviewed the above documentation for accuracy and completeness, and I agree with the above. -  Dennard Nip, MD

## 2020-08-06 ENCOUNTER — Other Ambulatory Visit: Payer: Self-pay | Admitting: Internal Medicine

## 2020-08-09 ENCOUNTER — Other Ambulatory Visit (INDEPENDENT_AMBULATORY_CARE_PROVIDER_SITE_OTHER): Payer: Self-pay | Admitting: Family Medicine

## 2020-08-09 ENCOUNTER — Other Ambulatory Visit (INDEPENDENT_AMBULATORY_CARE_PROVIDER_SITE_OTHER): Payer: Self-pay | Admitting: Adult Health

## 2020-08-09 DIAGNOSIS — E8881 Metabolic syndrome: Secondary | ICD-10-CM

## 2020-08-09 DIAGNOSIS — F3289 Other specified depressive episodes: Secondary | ICD-10-CM

## 2020-08-10 DIAGNOSIS — E669 Obesity, unspecified: Secondary | ICD-10-CM | POA: Diagnosis not present

## 2020-08-10 DIAGNOSIS — M064 Inflammatory polyarthropathy: Secondary | ICD-10-CM | POA: Diagnosis not present

## 2020-08-10 DIAGNOSIS — Z79899 Other long term (current) drug therapy: Secondary | ICD-10-CM | POA: Diagnosis not present

## 2020-08-10 DIAGNOSIS — M2550 Pain in unspecified joint: Secondary | ICD-10-CM | POA: Diagnosis not present

## 2020-08-10 DIAGNOSIS — Z6836 Body mass index (BMI) 36.0-36.9, adult: Secondary | ICD-10-CM | POA: Diagnosis not present

## 2020-08-10 DIAGNOSIS — M159 Polyosteoarthritis, unspecified: Secondary | ICD-10-CM | POA: Diagnosis not present

## 2020-08-10 NOTE — Telephone Encounter (Signed)
Dr.Beasley 

## 2020-08-10 NOTE — Telephone Encounter (Signed)
Pt last seen by Dr. Beasley.  

## 2020-08-12 NOTE — Telephone Encounter (Signed)
Patient is requesting a refill of the following medications: Requested Prescriptions   Pending Prescriptions Disp Refills   metFORMIN (GLUCOPHAGE) 500 MG tablet [Pharmacy Med Name: METFORMIN HCL 500 MG TABLET] 60 tablet 0    Sig: TAKE 1 TABLET BY MOUTH 2 TIMES DAILY WITH A MEAL.    Last office visit: 07/16/20 Date of last refill: 07/16/20 Last refill amount:60 Follow up time period per chart: 4 week Next appt : 08/13/20

## 2020-08-12 NOTE — Telephone Encounter (Signed)
Will wait till her appointment tomorrow

## 2020-08-12 NOTE — Telephone Encounter (Signed)
Please review

## 2020-08-13 ENCOUNTER — Other Ambulatory Visit: Payer: Self-pay

## 2020-08-13 ENCOUNTER — Ambulatory Visit (INDEPENDENT_AMBULATORY_CARE_PROVIDER_SITE_OTHER): Payer: PPO | Admitting: Family Medicine

## 2020-08-13 ENCOUNTER — Encounter (INDEPENDENT_AMBULATORY_CARE_PROVIDER_SITE_OTHER): Payer: Self-pay | Admitting: Family Medicine

## 2020-08-13 VITALS — BP 112/76 | HR 69 | Temp 98.7°F | Ht 68.0 in | Wt 235.0 lb

## 2020-08-13 DIAGNOSIS — Z6836 Body mass index (BMI) 36.0-36.9, adult: Secondary | ICD-10-CM

## 2020-08-13 DIAGNOSIS — R5383 Other fatigue: Secondary | ICD-10-CM | POA: Diagnosis not present

## 2020-08-13 DIAGNOSIS — E559 Vitamin D deficiency, unspecified: Secondary | ICD-10-CM | POA: Diagnosis not present

## 2020-08-13 DIAGNOSIS — R7303 Prediabetes: Secondary | ICD-10-CM

## 2020-08-13 DIAGNOSIS — F3289 Other specified depressive episodes: Secondary | ICD-10-CM | POA: Diagnosis not present

## 2020-08-13 DIAGNOSIS — E8881 Metabolic syndrome: Secondary | ICD-10-CM | POA: Diagnosis not present

## 2020-08-13 MED ORDER — METFORMIN HCL 500 MG PO TABS: 500.0000 mg | ORAL_TABLET | Freq: Two times a day (BID) | ORAL | 0 refills | Status: DC

## 2020-08-13 MED ORDER — VITAMIN D (ERGOCALCIFEROL) 1.25 MG (50000 UNIT) PO CAPS
50000.0000 [IU] | ORAL_CAPSULE | ORAL | 0 refills | Status: DC
Start: 2020-08-13 — End: 2020-09-16

## 2020-08-13 MED ORDER — TOPIRAMATE 50 MG PO TABS: 50.0000 mg | ORAL_TABLET | Freq: Every day | ORAL | 0 refills | Status: DC

## 2020-08-14 LAB — CMP14+EGFR
ALT: 30 IU/L (ref 0–32)
AST: 24 IU/L (ref 0–40)
Albumin/Globulin Ratio: 1.5 (ref 1.2–2.2)
Albumin: 4 g/dL (ref 3.8–4.8)
Alkaline Phosphatase: 94 IU/L (ref 44–121)
BUN/Creatinine Ratio: 31 — ABNORMAL HIGH (ref 9–23)
BUN: 25 mg/dL — ABNORMAL HIGH (ref 6–24)
Bilirubin Total: 0.3 mg/dL (ref 0.0–1.2)
CO2: 22 mmol/L (ref 20–29)
Calcium: 9.2 mg/dL (ref 8.7–10.2)
Chloride: 105 mmol/L (ref 96–106)
Creatinine, Ser: 0.8 mg/dL (ref 0.57–1.00)
Globulin, Total: 2.7 g/dL (ref 1.5–4.5)
Glucose: 75 mg/dL (ref 65–99)
Potassium: 4.1 mmol/L (ref 3.5–5.2)
Sodium: 140 mmol/L (ref 134–144)
Total Protein: 6.7 g/dL (ref 6.0–8.5)
eGFR: 90 mL/min/{1.73_m2} (ref >59)

## 2020-08-14 LAB — CBC WITH DIFFERENTIAL/PLATELET
Basophils Absolute: 0.1 10*3/uL (ref 0.0–0.2)
Basos: 1 %
EOS (ABSOLUTE): 0.2 10*3/uL (ref 0.0–0.4)
Eos: 3 %
Hematocrit: 38.8 % (ref 34.0–46.6)
Hemoglobin: 13 g/dL (ref 11.1–15.9)
Immature Grans (Abs): 0 10*3/uL (ref 0.0–0.1)
Immature Granulocytes: 1 %
Lymphocytes Absolute: 2.2 10*3/uL (ref 0.7–3.1)
Lymphs: 33 %
MCH: 28.3 pg (ref 26.6–33.0)
MCHC: 33.5 g/dL (ref 31.5–35.7)
MCV: 84 fL (ref 79–97)
Monocytes Absolute: 0.6 10*3/uL (ref 0.1–0.9)
Monocytes: 8 %
Neutrophils Absolute: 3.7 10*3/uL (ref 1.4–7.0)
Neutrophils: 54 %
Platelets: 266 10*3/uL (ref 150–450)
RBC: 4.6 x10E6/uL (ref 3.77–5.28)
RDW: 13.8 % (ref 11.7–15.4)
WBC: 6.7 10*3/uL (ref 3.4–10.8)

## 2020-08-14 LAB — LIPID PANEL WITH LDL/HDL RATIO
Cholesterol, Total: 155 mg/dL (ref 100–199)
HDL: 53 mg/dL (ref >39)
LDL Chol Calc (NIH): 82 mg/dL (ref 0–99)
LDL/HDL Ratio: 1.5 ratio (ref 0.0–3.2)
Triglycerides: 112 mg/dL (ref 0–149)
VLDL Cholesterol Cal: 20 mg/dL (ref 5–40)

## 2020-08-14 LAB — T3: T3, Total: 132 ng/dL (ref 71–180)

## 2020-08-14 LAB — HEMOGLOBIN A1C
Est. average glucose Bld gHb Est-mCnc: 120 mg/dL
Hgb A1c MFr Bld: 5.8 % — ABNORMAL HIGH (ref 4.8–5.6)

## 2020-08-14 LAB — T4, FREE: Free T4: 0.92 ng/dL (ref 0.82–1.77)

## 2020-08-14 LAB — INSULIN, RANDOM: INSULIN: 14.2 u[IU]/mL (ref 2.6–24.9)

## 2020-08-14 LAB — TSH: TSH: 2.04 u[IU]/mL (ref 0.450–4.500)

## 2020-08-14 LAB — VITAMIN D 25 HYDROXY (VIT D DEFICIENCY, FRACTURES): Vit D, 25-Hydroxy: 34.8 ng/mL (ref 30.0–100.0)

## 2020-08-14 LAB — VITAMIN B12: Vitamin B-12: 480 pg/mL (ref 232–1245)

## 2020-08-17 NOTE — Progress Notes (Signed)
Chief Complaint:   OBESITY [name] is here to discuss her progress with her obesity treatment plan along with follow-up of her obesity related diagnoses. [name] is on following a lower carbohydrate, vegetable and lean protein rich diet plan and states she is following her eating plan approximately 60% of the time. [name] states she is walking outside at work 5,000 steps daily 3 times per week.  Today's visit was #: 67 Starting weight: 241 lbs Starting date: 09/28/2016 Today's weight: 235 lbs Today's date: 08/13/2020 Total lbs lost to date: 6 Total lbs lost since last in-office visit: 0  Interim History: [name] has been working on diet, but she is retaining some water weight today. She struggles with weight loss and her fatigue seems  To be worse.  Subjective:   1. Fatigue, unspecified type [name] notices an increase in fatigue. She wonders if she is having more inflammation and would like to have labs looked at to see if a cause is obvious.  2. Vitamin D deficiency [name] is on Vit D, and she is at risk of over-replacement.  3. Pre-diabetes [name] is working on diet and weight loss, but she is struggling more.  4. Other depression with emotional eating [name] is stable on Topamax, and she notes daytime fatigue but she has been stable on Topamax for a while.  Assessment/Plan:   1. Fatigue, unspecified type We will check labs today, and in the meanwhile, [name] will focus on self care including making healthy food choices, increasing physical activity and focusing on stress reduction.  - Vitamin B12 - CBC with Differential/Platelet - CMP14+EGFR - Lipid Panel With LDL/HDL Ratio - T3 - T4, free - TSH  2. Vitamin D deficiency Low Vitamin D level contributes to fatigue and are associated with obesity, breast, and colon cancer. We will check labs today, and we will refill prescription Vitamin D for 1 month. Tiny will follow-up for routine testing of  Vitamin D, at least 2-3 times per year to avoid over-replacement.  - Vitamin D, Ergocalciferol, (DRISDOL) 1.25 MG (50000 UNIT) CAPS capsule; Take 1 capsule (50,000 Units total) by mouth every 7 (seven) days.  Dispense: 4 capsule; Refill: 0 - VITAMIN D 25 Hydroxy (Vit-D Deficiency, Fractures)  3. Pre-diabetes [name] will continue to work on weight loss, exercise, and decreasing simple carbohydrates to help decrease the risk of diabetes. We will check labs today, and we will refill metformin for 1 month.  - metFORMIN (GLUCOPHAGE) 500 MG tablet; Take 1 tablet (500 mg total) by mouth 2 (two) times daily with a meal.  Dispense: 60 tablet; Refill: 0 - Insulin, random - Hemoglobin A1c  4. Other depression with emotional eating Behavior modification techniques were discussed today to help [name] deal with her emotional/non-hunger eating behaviors. We will refill Topamax for 1 month. Orders and follow up as documented in patient record.   - topiramate (TOPAMAX) 50 MG tablet; Take 1 tablet (50 mg total) by mouth at bedtime.  Dispense: 30 tablet; Refill: 0  5. Obesity with current BMI 35.9 [name] is currently in the action stage of change. As such, her goal is to continue with weight loss efforts. She has agreed to the Category 2 Plan or following a lower carbohydrate, vegetable and lean protein rich diet plan.   Exercise goals: As is.  Behavioral modification strategies: increasing lean protein intake and decreasing simple carbohydrates.  [name] has agreed to follow-up with our clinic in 4 weeks. She was informed of the importance of frequent  follow-up visits to maximize her success with intensive lifestyle modifications for her multiple health conditions.   [name] was informed we would discuss her lab results at her next visit unless there is a critical issue that needs to be addressed sooner. [name] agreed to keep her next visit at the agreed upon time to discuss these  results.  Objective:   Blood pressure 112/76, pulse 69, temperature 98.7 F (37.1 C), height '5\' 8"'  (1.727 m), weight 235 lb (106.6 kg), SpO2 96 %. Body mass index is 35.73 kg/m.  General: Cooperative, alert, well developed, in no acute distress. HEENT: Conjunctivae and lids unremarkable. Cardiovascular: Regular rhythm.  Lungs: Normal work of breathing. Neurologic: No focal deficits.   Lab Results  Component Value Date   CREATININE 0.80 08/13/2020   BUN 25 (H) 08/13/2020   NA 140 08/13/2020   K 4.1 08/13/2020   CL 105 08/13/2020   CO2 22 08/13/2020   Lab Results  Component Value Date   ALT 30 08/13/2020   AST 24 08/13/2020   ALKPHOS 94 08/13/2020   BILITOT 0.3 08/13/2020   Lab Results  Component Value Date   HGBA1C 5.8 (H) 08/13/2020   HGBA1C 5.9 04/28/2020   HGBA1C 5.5 11/11/2019   HGBA1C 5.3 07/08/2019   HGBA1C 5.4 02/21/2019   Lab Results  Component Value Date   INSULIN 14.2 08/13/2020   INSULIN 25.1 (H) 04/30/2020   INSULIN 16.4 11/11/2019   INSULIN 17.3 02/21/2019   INSULIN 14.1 10/29/2018   Lab Results  Component Value Date   TSH 2.040 08/13/2020   Lab Results  Component Value Date   CHOL 155 08/13/2020   HDL 53 08/13/2020   LDLCALC 82 08/13/2020   TRIG 112 08/13/2020   CHOLHDL 3 08/16/2016   Lab Results  Component Value Date   WBC 6.7 08/13/2020   HGB 13.0 08/13/2020   HCT 38.8 08/13/2020   MCV 84 08/13/2020   PLT 266 08/13/2020   No results found for: IRON, TIBC, FERRITIN  Obesity Behavioral Intervention:   Approximately 15 minutes were spent on the discussion below.  ASK: We discussed the diagnosis of obesity with [name] today and [name] agreed to give [place] permission to discuss obesity behavioral modification therapy today.  ASSESS: [name] has the diagnosis of obesity and her BMI today is 35.74. [name] is in the action stage of change.   ADVISE: [name] was educated on the multiple health risks of obesity as well as the  benefit of weight loss to improve her health. She was advised of the need for long term treatment and the importance of lifestyle modifications to improve her current health and to decrease her risk of future health problems.  AGREE: Multiple dietary modification options and treatment options were discussed and Zeniyah agreed to follow the recommendations documented in the above note.  ARRANGE: Icy was educated on the importance of frequent visits to treat obesity as outlined per CMS and USPSTF guidelines and agreed to schedule her next follow up appointment today.  Attestation Statements:   Reviewed by clinician on day of visit: allergies, medications, problem list, medical history, surgical history, family history, social history, and previous encounter notes.   I, Trixie Dredge, am acting as transcriptionist for Dennard Nip, MD.  I have reviewed the above documentation for accuracy and completeness, and I agree with the above. -  Dennard Nip, MD

## 2020-08-24 ENCOUNTER — Encounter: Payer: Self-pay | Admitting: Internal Medicine

## 2020-08-24 DIAGNOSIS — M159 Polyosteoarthritis, unspecified: Secondary | ICD-10-CM

## 2020-08-24 DIAGNOSIS — M064 Inflammatory polyarthropathy: Secondary | ICD-10-CM

## 2020-08-25 DIAGNOSIS — M064 Inflammatory polyarthropathy: Secondary | ICD-10-CM | POA: Insufficient documentation

## 2020-08-25 DIAGNOSIS — L4050 Arthropathic psoriasis, unspecified: Secondary | ICD-10-CM | POA: Insufficient documentation

## 2020-08-25 DIAGNOSIS — M159 Polyosteoarthritis, unspecified: Secondary | ICD-10-CM | POA: Insufficient documentation

## 2020-08-27 ENCOUNTER — Ambulatory Visit
Admission: RE | Admit: 2020-08-27 | Discharge: 2020-08-27 | Disposition: A | Discharge status: Home / Self Care | Payer: PPO | Source: Ambulatory Visit | Attending: Internal Medicine | Admitting: Internal Medicine

## 2020-08-27 ENCOUNTER — Other Ambulatory Visit: Payer: Self-pay

## 2020-08-27 DIAGNOSIS — Z1231 Encounter for screening mammogram for malignant neoplasm of breast: Secondary | ICD-10-CM | POA: Diagnosis not present

## 2020-09-12 ENCOUNTER — Other Ambulatory Visit (INDEPENDENT_AMBULATORY_CARE_PROVIDER_SITE_OTHER): Payer: Self-pay | Admitting: Family Medicine

## 2020-09-12 DIAGNOSIS — E559 Vitamin D deficiency, unspecified: Secondary | ICD-10-CM

## 2020-09-14 ENCOUNTER — Other Ambulatory Visit (INDEPENDENT_AMBULATORY_CARE_PROVIDER_SITE_OTHER): Payer: Self-pay | Admitting: Family Medicine

## 2020-09-14 ENCOUNTER — Ambulatory Visit (INDEPENDENT_AMBULATORY_CARE_PROVIDER_SITE_OTHER): Payer: PPO | Admitting: Family Medicine

## 2020-09-14 ENCOUNTER — Other Ambulatory Visit: Payer: Self-pay

## 2020-09-14 ENCOUNTER — Encounter (INDEPENDENT_AMBULATORY_CARE_PROVIDER_SITE_OTHER): Payer: Self-pay | Admitting: Family Medicine

## 2020-09-14 VITALS — BP 120/76 | HR 83 | Temp 98.8°F | Ht 68.0 in | Wt 235.0 lb

## 2020-09-14 DIAGNOSIS — E860 Dehydration: Secondary | ICD-10-CM

## 2020-09-14 DIAGNOSIS — F3289 Other specified depressive episodes: Secondary | ICD-10-CM

## 2020-09-14 DIAGNOSIS — R7303 Prediabetes: Secondary | ICD-10-CM

## 2020-09-14 DIAGNOSIS — Z6835 Body mass index (BMI) 35.0-35.9, adult: Secondary | ICD-10-CM | POA: Diagnosis not present

## 2020-09-14 DIAGNOSIS — E66812 Obesity, class 2: Secondary | ICD-10-CM

## 2020-09-14 NOTE — Telephone Encounter (Signed)
Dr.Beasley 

## 2020-09-14 NOTE — Telephone Encounter (Signed)
DR Beasley 

## 2020-09-15 ENCOUNTER — Other Ambulatory Visit (INDEPENDENT_AMBULATORY_CARE_PROVIDER_SITE_OTHER): Payer: Self-pay | Admitting: Family Medicine

## 2020-09-15 DIAGNOSIS — R7303 Prediabetes: Secondary | ICD-10-CM

## 2020-09-15 NOTE — Telephone Encounter (Signed)
Patient is requesting a refill of the following medications: Requested Prescriptions   Pending Prescriptions Disp Refills   metFORMIN (GLUCOPHAGE) 500 MG tablet 60 tablet 0    Sig: Take 1 tablet (500 mg total) by mouth 2 (two) times daily with a meal.     Last office visit: 09/15/20 Date of last refill: 08/13/20 Last refill amount: 60 Follow up time period per chart: 6 week Next appt :10/12/20

## 2020-09-15 NOTE — Telephone Encounter (Signed)
Correction last seen 09/14/20

## 2020-09-16 NOTE — Telephone Encounter (Signed)
Patient is requesting a refill of the following medications: Requested Prescriptions   Pending Prescriptions Disp Refills   topiramate (TOPAMAX) 50 MG tablet [Pharmacy Med Name: TOPIRAMATE 50 MG TABLET] 30 tablet 0    Sig: TAKE 1 TABLET BY MOUTH EVERYDAY AT BEDTIME   metFORMIN (GLUCOPHAGE) 500 MG tablet [Pharmacy Med Name: METFORMIN HCL 500 MG TABLET] 60 tablet 0    Sig: TAKE 1 TABLET BY MOUTH 2 TIMES DAILY WITH A MEAL.     Last office visit: 09/14/20 Date of last refill: 08/13/20 Last refill amount: 30 and 60 Follow up time period per chart: 6 week Next appt: 10/12/20

## 2020-09-16 NOTE — Telephone Encounter (Signed)
Patient is requesting a refill of the following medications: Requested Prescriptions   Pending Prescriptions Disp Refills   Vitamin D, Ergocalciferol, (DRISDOL) 1.25 MG (50000 UNIT) CAPS capsule [Pharmacy Med Name: VITAMIN D2 1.25MG (50,000 UNIT)] 4 capsule 0    Sig: Take 1 capsule (50,000 Units total) by mouth every 7 (seven) days.     Last office visit: 09/14/20 Date of last refill: 08/13/20 Last refill amount: 4 Follow up time period per chart: 6 week Next Appt: 10/12/20

## 2020-09-21 NOTE — Progress Notes (Signed)
Chief Complaint:   OBESITY [name] is here to discuss her progress with her obesity treatment plan along with follow-up of her obesity related diagnoses. [name] is on the Category 2 Plan or following a lower carbohydrate, vegetable and lean protein rich diet plan and states she is following her eating plan approximately 50% of the time. [name] states she is doing yard work and swimming for 30 minutes 7 times per week.  Today's visit was #: 32 Starting weight: 241 lbs Starting date: 09/28/2016 Today's weight: 238 lbs Today's date: 09/14/2020 Total lbs lost to date: 3 Total lbs lost since last in-office visit: 0  Interim History: [name] has increased her activity and she is swimming a solid 30 minutes most days of the week. She is up in water weight and this may be in part due to increased muscle. Her appetite is low and she struggles to eat all of her protein.  Subjective:   1. Dehydration [name]'s BUN is still elevated at 25. She is drinking a lot of water but her urine is still yellow.  Assessment/Plan:   1. Dehydration [name] is to continue to increase her water intake, and we will recheck labs in 2 months.  2. Obesity with current BMI 36.2 [name] is currently in the action stage of change. As such, her goal is to continue with weight loss efforts. She has agreed to keeping a food journal and adhering to recommended goals of 1200 calories and 80+ grams of protein daily.   Protein supplements are ok for now to meet her goal.  Exercise goals: As is.  Behavioral modification strategies: increasing lean protein intake and increasing water intake.  [name] has agreed to follow-up with our clinic in 4 to 6 weeks. She was informed of the importance of frequent follow-up visits to maximize her success with intensive lifestyle modifications for her multiple health conditions.   Objective:   Blood pressure 120/76, pulse 83, temperature 98.8 F (37.1 C), height 5\' 8"   (1.727 m), weight 235 lb (106.6 kg), SpO2 98 %. Body mass index is 35.73 kg/m.  General: Cooperative, alert, well developed, in no acute distress. HEENT: Conjunctivae and lids unremarkable. Cardiovascular: Regular rhythm.  Lungs: Normal work of breathing. Neurologic: No focal deficits.   Lab Results  Component Value Date   CREATININE 0.80 08/13/2020   BUN 25 (H) 08/13/2020   NA 140 08/13/2020   K 4.1 08/13/2020   CL 105 08/13/2020   CO2 22 08/13/2020   Lab Results  Component Value Date   ALT 30 08/13/2020   AST 24 08/13/2020   ALKPHOS 94 08/13/2020   BILITOT 0.3 08/13/2020   Lab Results  Component Value Date   HGBA1C 5.8 (H) 08/13/2020   HGBA1C 5.9 04/28/2020   HGBA1C 5.5 11/11/2019   HGBA1C 5.3 07/08/2019   HGBA1C 5.4 02/21/2019   Lab Results  Component Value Date   INSULIN 14.2 08/13/2020   INSULIN 25.1 (H) 04/30/2020   INSULIN 16.4 11/11/2019   INSULIN 17.3 02/21/2019   INSULIN 14.1 10/29/2018   Lab Results  Component Value Date   TSH 2.040 08/13/2020   Lab Results  Component Value Date   CHOL 155 08/13/2020   HDL 53 08/13/2020   LDLCALC 82 08/13/2020   TRIG 112 08/13/2020   CHOLHDL 3 08/16/2016   Lab Results  Component Value Date   WBC 6.7 08/13/2020   HGB 13.0 08/13/2020   HCT 38.8 08/13/2020   MCV 84 08/13/2020   PLT 266  08/13/2020   No results found for: IRON, TIBC, FERRITIN  Attestation Statements:   Reviewed by clinician on day of visit: allergies, medications, problem list, medical history, surgical history, family history, social history, and previous encounter notes.  Time spent on visit including pre-visit chart review and post-visit care and charting was 22 minutes.    I, Trixie Dredge, am acting as transcriptionist for Dennard Nip, MD.  I have reviewed the above documentation for accuracy and completeness, and I agree with the above. -   Dennard Nip, MD

## 2020-10-09 ENCOUNTER — Other Ambulatory Visit (INDEPENDENT_AMBULATORY_CARE_PROVIDER_SITE_OTHER): Payer: Self-pay | Admitting: Family Medicine

## 2020-10-09 DIAGNOSIS — F3289 Other specified depressive episodes: Secondary | ICD-10-CM

## 2020-10-09 DIAGNOSIS — R7303 Prediabetes: Secondary | ICD-10-CM

## 2020-10-12 ENCOUNTER — Encounter: Payer: Self-pay | Admitting: Emergency Medicine

## 2020-10-12 ENCOUNTER — Telehealth (INDEPENDENT_AMBULATORY_CARE_PROVIDER_SITE_OTHER): Payer: PPO | Admitting: Emergency Medicine

## 2020-10-12 ENCOUNTER — Ambulatory Visit (INDEPENDENT_AMBULATORY_CARE_PROVIDER_SITE_OTHER): Payer: PPO | Admitting: Family Medicine

## 2020-10-12 DIAGNOSIS — J22 Unspecified acute lower respiratory infection: Secondary | ICD-10-CM | POA: Diagnosis not present

## 2020-10-12 DIAGNOSIS — R6889 Other general symptoms and signs: Secondary | ICD-10-CM | POA: Diagnosis not present

## 2020-10-12 DIAGNOSIS — R059 Cough, unspecified: Secondary | ICD-10-CM | POA: Diagnosis not present

## 2020-10-12 MED ORDER — PROMETHAZINE-DM 6.25-15 MG/5ML PO SYRP
5.0000 mL | ORAL_SOLUTION | Freq: Four times a day (QID) | ORAL | 1 refills | Status: DC | PRN
Start: 2020-10-12 — End: 2020-10-25

## 2020-10-12 MED ORDER — AZITHROMYCIN 250 MG PO TABS: ORAL_TABLET | ORAL | 0 refills | Status: DC

## 2020-10-12 MED ORDER — BENZONATATE 200 MG PO CAPS: 200.0000 mg | ORAL_CAPSULE | Freq: Two times a day (BID) | ORAL | 0 refills | Status: DC | PRN

## 2020-10-12 NOTE — Telephone Encounter (Signed)
Pt last seen by Dr. Beasley.  

## 2020-10-12 NOTE — Progress Notes (Signed)
Telemedicine Encounter- SOAP NOTE Established Patient MyChart video conference Patient: Home  Provider: Office   Patient present only  This video encounter was conducted with the patient's (or proxy's) verbal consent via video telecommunications: yes/no: Yes Patient was instructed to have this encounter in a suitably private space; and to only have persons present to whom they give permission to participate. In addition, patient identity was confirmed by use of name plus two identifiers (DOB and address).  I discussed the limitations, risks, security and privacy concerns of performing an evaluation and management service by telephone and the availability of in person appointments. I also discussed with the patient that there may be a patient responsible charge related to this service. The patient expressed understanding and agreed to proceed.  I spent a total of TIME; 0 MIN TO 60 MIN: 20 minutes talking with the patient or their proxy.  Chief complaint: Cough  Subjective   [name] is a 50 y.o. female established patient. Telephone visit today complaining of flulike symptoms that started last Thursday night, about 4 days ago, with low-grade fever, headache, congestion, dry cough leading to chest hurting.  Was recently in [place].  Husband not sick.  Tested negative for COVID twice.  Fully vaccinated with boosters.  Denies loss of smell or taste.  Able to eat and drink.  Denies nausea or vomiting.  Denies diarrhea.  Has history of multiple rheumatic conditions. No other significant symptoms. No other complaints or medical concerns today.  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   Other insomnia 06/06/2017   Depression 83/25/4982   Umbilical hernia 64/15/8309   Diverticulosis of colon 02/26/2017   Other hyperlipidemia 02/06/2017   Asthma 11/24/2016   Vitamin D deficiency 11/09/2016   Class 1 obesity with serious comorbidity and body mass index (BMI) of 33.0 to 33.9 in adult 11/09/2016   Right shoulder pain 10/11/2016   AC (acromioclavicular) joint arthritis 09/13/2016   Postsurgical menopause 08/16/2016   Mucoid cyst of joint 11/04/2015   External hemorrhoid 08/20/2015   Prediabetes 06/05/2015   GERD (gastroesophageal reflux disease) 06/04/2015   Status post bilateral breast reduction 04/01/2015   Cervical disc disorder with radiculopathy of cervical region 05/23/2014   Ulnar neuropathy 01/16/2014   Fibromyalgia 01/16/2014   Recurrent nephrolithiasis 04/29/2013   Arthralgia 04/29/2013   Panic attacks 01/15/2013   IBS (irritable bowel syndrome) 12/14/2011   B12 deficiency 06/30/2009   Essential hypertension 03/02/2007    Past Medical History:  Diagnosis Date   Anemia    Anxiety    Arthritis    inflammitory   Asthma    B12 deficiency    Back pain    Chronic joint pain    Constipation    DDD (degenerative disc disease), lumbar    low back and neck and shoulders   Depression    during divorce & legal matters   Fatty liver    Fibromyalgia    Gallbladder problem    GERD (gastroesophageal reflux disease)    History of kidney stones    Dr Phebe Colla   History of polycystic ovarian disease S/P BSO   Hx of anxiety disorder  Hypertension    a   Hypothyroidism    no meds   IBS (irritable bowel syndrome)    IBS (irritable bowel syndrome)    Infertility, female    Joint pain    Kidney problem    Lactose intolerance    Lupus ([facility]) hx positive ANA   followed by Dr Gavin Pound; possible lupus   Multiple food allergies    Myalgia    Osteoarthritis    Polyarthritis, 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[place])     POLYCYSTIC OVARIAN DISEASE 03/02/2007   Qualifier: Diagnosis of  By: Linna Darner MD, Rae Mar BSO for cysts & endometriosis; Dr Reino Kent, Gyn Seeing Dr Paula Compton     PONV (postoperative nausea and vomiting)    Pre-diabetes    Prediabetes    Sweating profusely    Symptomatic mammary hypertrophy    URI (upper respiratory infection)    currently on cefdinir    Current Outpatient Medications  Medication Sig Dispense Refill   acetaminophen (TYLENOL) 650 MG CR tablet Take 1,300 mg by mouth every 8 (eight) hours as needed for pain.     albuterol (VENTOLIN HFA) 108 (90 Base) MCG/ACT inhaler TAKE 2 PUFFS BY MOUTH EVERY 6 HOURS AS NEEDED FOR WHEEZE OR SHORTNESS OF BREATH (Patient taking differently: Inhale 2 puffs into the lungs every 6 (six) hours as needed for shortness of breath or wheezing.) 8.5 g 5   aspirin EC 81 MG tablet Take 81 mg by mouth daily. Swallow whole.     Blood Glucose Monitoring Suppl (ONE TOUCH ULTRA 2) w/Device KIT OneTouch Ultra2 Meter     buPROPion (WELLBUTRIN SR) 150 MG 12 hr tablet Take 1 tablet (150 mg total) by mouth daily. 30 tablet 0   celecoxib (CELEBREX) 200 MG capsule Take 200 mg by mouth 2 (two) times daily.     citalopram (CELEXA) 20 MG tablet TAKE 1 TABLET BY MOUTH EVERY DAY 90 tablet 1   diphenhydramine-acetaminophen (TYLENOL PM) 25-500 MG TABS tablet Take 2 tablets by mouth at bedtime.     famotidine (PEPCID) 20 MG tablet Take 1 tablet (20 mg total) by mouth at bedtime. 90 tablet 1   glucose blood (ONETOUCH ULTRA) test strip TEST BLOOD SUGARS ONCE A DAY 100 strip 0   hydrochlorothiazide (MICROZIDE) 12.5 MG capsule TAKE 1 CAPSULE BY MOUTH EVERY DAY 90 capsule 1   hydroxychloroquine (PLAQUENIL) 200 MG tablet Take 200 mg by mouth 2 (two) times daily.     Insulin Pen Needle (BD PEN NEEDLE NANO U/F) 32G X 4 MM MISC 1 each by Other route daily. 100 each 0   liraglutide (VICTOZA) 18 MG/3ML SOPN Inject 1.8 mg into the skin every morning. 9 mL 0   lisinopril  (ZESTRIL) 40 MG tablet TAKE 1 TABLET BY MOUTH EVERY DAY 90 tablet 1   metFORMIN (GLUCOPHAGE) 500 MG tablet TAKE 1 TABLET BY MOUTH 2 TIMES DAILY WITH A MEAL. 60 tablet 0   montelukast (SINGULAIR) 10 MG tablet TAKE 1 TABLET BY MOUTH EVERYDAY AT BEDTIME 90 tablet 1   omeprazole (PRILOSEC) 20 MG capsule Take 1 capsule (20 mg total) by mouth daily. 180 capsule 1   OneTouch Delica Lancets 69G MISC 1 each by Does not apply route 2 (two) times daily. 100 each 0   topiramate (TOPAMAX) 50 MG tablet TAKE 1 TABLET BY MOUTH EVERYDAY AT BEDTIME 30 tablet 0   Vitamin D, Ergocalciferol, (DRISDOL) 1.25 MG (50000 UNIT) CAPS capsule TAKE 1 CAPSULE (50,000 UNITS TOTAL) BY MOUTH EVERY 7 (  SEVEN) DAYS 4 capsule 0   No current facility-administered medications for this visit.    Allergies  Allergen Reactions   Chlorhexidine Gluconate Rash, Itching and Swelling    Developed severe rash where chloraprep was used on chest area   Ivp Dye [Iodinated Diagnostic Agents] Rash and Other (See Comments)    Flushing, minor facial rash & dyspnea   Nalbuphine Shortness Of Breath and Rash    Nubain caused respiratory distress & rash   Septra [Sulfamethoxazole-Trimethoprim] Shortness Of Breath and Rash   Adhesive [Tape] Rash   Diclofenac Other (See Comments)    GI Upset   Augmentin [Amoxicillin-Pot Clavulanate] Nausea And Vomiting   Betadine [Povidone Iodine] Rash   Latex Rash    Social History   Socioeconomic History   Marital status: Married    Spouse name: Darnelle Maffucci   Number of children: 0   Years of education: college   Highest education level: Not on file  Occupational History   Occupation: disabled  Tobacco Use   Smoking status: Never   Smokeless tobacco: Never  Substance and Sexual Activity   Alcohol use: Yes    Alcohol/week: 1.0 standard drink    Types: 1 Shots of liquor per week    Comment:  very rarely   Drug use: No   Sexual activity: Yes    Birth control/protection: Surgical  Other Topics Concern    Not on file  Social History Narrative   Patient Lives at home with her husband Darnelle Maffucci)   Disabled.   Education two years of college.   Right handed.   Caffeine coffee and sweet tea. Not daily.   Social Determinants of Health   Financial Resource Strain: Not on file  Food Insecurity: Not on file  Transportation Needs: Not on file  Physical Activity: Not on file  Stress: Not on file  Social Connections: Not on file  Intimate Partner Violence: Not on file    Review of Systems  Constitutional:  Positive for fever and malaise/fatigue.  HENT:  Positive for congestion. Negative for sore throat.   Respiratory:  Positive for cough. Negative for hemoptysis, sputum production, shortness of breath and wheezing.   Cardiovascular:  Positive for chest pain. Negative for palpitations.  Gastrointestinal:  Negative for abdominal pain, diarrhea, nausea and vomiting.  Genitourinary: Negative.  Negative for dysuria and hematuria.  Musculoskeletal:  Positive for joint pain. Negative for myalgias.  Skin: Negative.  Negative for rash.  Neurological:  Negative for dizziness and headaches.  All other systems reviewed and are negative.  Objective  Alert and oriented x3 in no apparent respiratory distress.  Moving all extremities.  No rashes. Vitals as reported by the patient: There were no vitals filed for this visit.  There are no diagnoses linked to this encounter. Clinically stable.  No red flag signs or symptoms.  May be developing secondary bacterial infection. May benefit from Z-Pak.  Will use Tessalon and Hycodan syrup. Uncontrolled cough unresponsive to over-the-counter medications. Advised to take medications as prescribed, rest, stay well-hydrated, and stay home until much improved. Advised to contact the office if no better or worse during the next several days.  COVID precautions given ED precautions given. Diagnoses and all orders for this visit:  Cough -     benzonatate  (TESSALON) 200 MG capsule; Take 1 capsule (200 mg total) by mouth 2 (two) times daily as needed for cough. -     promethazine-dextromethorphan (PROMETHAZINE-DM) 6.25-15 MG/5ML syrup; Take 5 mLs by mouth 4 (four)  times daily as needed for cough.  Flu-like symptoms  Lower respiratory infection -     azithromycin (ZITHROMAX) 250 MG tablet; Sig as indicated    I discussed the assessment and treatment plan with the patient. The patient was provided an opportunity to ask questions and all were answered. The patient agreed with the plan and demonstrated an understanding of the instructions.   The patient was advised to call back or seek an in-person evaluation if the symptoms worsen or if the condition fails to improve as anticipated.  I provided 20 minutes of non-face-to-face time during this encounter.  Horald Pollen, MD  Primary Care at [facility]

## 2020-10-13 NOTE — Telephone Encounter (Signed)
Patient is requesting a refill of the following medications: Requested Prescriptions   Pending Prescriptions Disp Refills   topiramate (TOPAMAX) 50 MG tablet [Pharmacy Med Name: TOPIRAMATE 50 MG TABLET] 30 tablet 0    Sig: TAKE 1 TABLET BY MOUTH EVERYDAY AT BEDTIME   metFORMIN (GLUCOPHAGE) 500 MG tablet [Pharmacy Med Name: METFORMIN HCL 500 MG TABLET] 60 tablet 0    Sig: TAKE 1 TABLET BY MOUTH 2 TIMES DAILY WITH A MEAL.    Last office visit: 09/14/20 Date of last refill: 09/16/20 Last refill amount: 30 Follow up time period per chart: 6 week Next appt:11/02/20

## 2020-10-25 ENCOUNTER — Encounter: Payer: Self-pay | Admitting: Internal Medicine

## 2020-10-25 NOTE — Progress Notes (Signed)
Subjective:    Patient ID: [name], female    DOB: March 15, 1971, 50 y.o.   MRN: [id number]   This visit occurred during the SARS-CoV-2 public health emergency.  Safety protocols were in place, including screening questions prior to the visit, additional usage of staff PPE, and extensive cleaning of exam room while observing appropriate contact time as indicated for disinfecting solutions.    HPI She is here for a physical exam.   Overall she is doing well.  She is frustrated by her lack of weight loss.  She is following with the [facility] clinic.  She feels she is doing everything she should be doing.    Medications and allergies reviewed with patient and updated if appropriate.  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   Generalized osteoarthritis 08/25/2020   Insulin resistance 12/30/2019   Chronic back pain 12/24/2019   Dry cough 11/21/2017   Anxiety 08/17/2017   Hyperhidrosis 08/17/2017   Depression 53/74/8270   Umbilical hernia 78/67/5449   Diverticulosis of colon 02/26/2017   Other hyperlipidemia 02/06/2017   Asthma 11/24/2016   Vitamin D deficiency 11/09/2016   Class 1 obesity with serious comorbidity and body mass index (BMI) of 33.0 to 33.9 in adult 11/09/2016   Right shoulder pain 10/11/2016   AC (acromioclavicular) joint arthritis 09/13/2016   Postsurgical menopause 08/16/2016   Mucoid cyst of joint 11/04/2015   External hemorrhoid 08/20/2015   Prediabetes 06/05/2015   GERD (gastroesophageal reflux disease) 06/04/2015   Status post bilateral breast reduction 04/01/2015   Cervical disc disorder with radiculopathy of cervical region 05/23/2014   Ulnar neuropathy 01/16/2014   Fibromyalgia 01/16/2014   Recurrent nephrolithiasis 04/29/2013   Arthralgia 04/29/2013   Panic attacks 01/15/2013   IBS (irritable bowel syndrome) 12/14/2011   B12 deficiency 06/30/2009   Essential  hypertension 03/02/2007    Current Outpatient Medications on File Prior to Visit  Medication Sig Dispense Refill   albuterol (VENTOLIN HFA) 108 (90 Base) MCG/ACT inhaler TAKE 2 PUFFS BY MOUTH EVERY 6 HOURS AS NEEDED FOR WHEEZE OR SHORTNESS OF BREATH (Patient taking differently: Inhale 2 puffs into the lungs every 6 (six) hours as needed for shortness of breath or wheezing.) 8.5 g 5   Blood Glucose Monitoring Suppl (ONE TOUCH ULTRA 2) w/Device KIT OneTouch Ultra2 Meter     buPROPion (WELLBUTRIN SR) 150 MG 12 hr tablet Take 1 tablet (150 mg total) by mouth daily. 30 tablet 0   celecoxib (CELEBREX) 200 MG capsule Take 200 mg by mouth 2 (two) times daily.     citalopram (CELEXA) 20 MG tablet TAKE 1 TABLET BY MOUTH EVERY DAY 90 tablet 1   diphenhydramine-acetaminophen (TYLENOL PM) 25-500 MG TABS tablet Take 2 tablets by mouth at bedtime.     famotidine (PEPCID) 20 MG tablet Take 1 tablet (20 mg total) by mouth at bedtime. 90 tablet 1   glucose blood (ONETOUCH ULTRA) test strip TEST BLOOD SUGARS ONCE A DAY 100 strip 0   hydrochlorothiazide (MICROZIDE) 12.5 MG capsule TAKE 1 CAPSULE BY MOUTH EVERY DAY 90 capsule 1   hydroxychloroquine (PLAQUENIL) 200 MG tablet Take 200 mg by mouth 2 (two) times daily.     Insulin Pen Needle (BD PEN NEEDLE NANO U/F) 32G X 4 MM MISC 1 each by Other route daily. 100 each 0   lisinopril (ZESTRIL) 40 MG tablet TAKE 1 TABLET BY MOUTH EVERY DAY 90 tablet 1  montelukast (SINGULAIR) 10 MG tablet TAKE 1 TABLET BY MOUTH EVERYDAY AT BEDTIME 90 tablet 1   OneTouch Delica Lancets 76E MISC 1 each by Does not apply route 2 (two) times daily. 100 each 0   Vitamin D, Ergocalciferol, (DRISDOL) 1.25 MG (50000 UNIT) CAPS capsule TAKE 1 CAPSULE (50,000 UNITS TOTAL) BY MOUTH EVERY 7 (SEVEN) DAYS 4 capsule 0   No current facility-administered medications on file prior to visit.    Past Medical History:  Diagnosis Date   Anemia    Anxiety    Arthritis    inflammitory   Asthma    B12  deficiency    Back pain    Chronic joint pain    Constipation    DDD (degenerative disc disease), lumbar    low back and neck and shoulders   Depression    during divorce & legal matters   Fatty liver    Fibromyalgia    Gallbladder problem    GERD (gastroesophageal reflux disease)    History of kidney stones    Dr Phebe Colla   History of polycystic ovarian disease S/P BSO   Hx of anxiety disorder    Hypertension    a   Hypothyroidism    no meds   IBS (irritable bowel syndrome)    IBS (irritable bowel syndrome)    Infertility, female    Joint pain    Kidney problem    Lactose intolerance    Lupus (HCC) hx positive ANA   followed by Dr Gavin Pound; possible lupus   Multiple food allergies    Myalgia    Osteoarthritis    Polyarthritis, 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[place])    POLYCYSTIC OVARIAN DISEASE 03/02/2007   Qualifier: Diagnosis of  By: Linna Darner MD, Rae Mar BSO for cysts & endometriosis; Dr Reino Kent, Gyn Seeing Dr Paula Compton     PONV (postoperative nausea and vomiting)    Pre-diabetes    Prediabetes    Sweating profusely    Symptomatic mammary hypertrophy    URI (upper respiratory infection)    currently on cefdinir    Past Surgical History:  Procedure Laterality Date   ABDOMINAL HYSTERECTOMY     [place]   exploratory lap   BACK SURGERY     BREAST REDUCTION SURGERY Bilateral 03/26/2015   Procedure: BILATERAL BREAST REDUCTION ;  Surgeon: Wallace Going, DO;  Location: [place];  Service: Plastics;  Laterality: Bilateral;   CARPAL TUNNEL RELEASE     bilateral; Dr Sherwood Gambler   COLONOSCOPY     in [employer]  07-09-2004   HX BILATERAL RENAL STONES/ RIGHT FLANK PAIN   CYSTOSCOPY W/ URETERAL STENT PLACEMENT  11/12/2011   Procedure: CYSTOSCOPY WITH RETROGRADE PYELOGRAM/URETERAL STENT PLACEMENT;  Surgeon: Alexis Frock, MD;  Location: [facility];  Service: Urology;  Laterality:  Right;   CYSTOSCOPY W/ URETERAL STENT REMOVAL  12/07/2011   Procedure: CYSTOSCOPY WITH STENT REMOVAL;  Surgeon: Alexis Frock, MD;  [facility];  Service: Urology;  Laterality: Right;   CYSTOSCOPY/RETROGRADE/URETEROSCOPY/STONE EXTRACTION WITH BASKET  12/07/2011   Procedure: CYSTOSCOPY/RETROGRADE/URETEROSCOPY/STONE EXTRACTION WITH BASKET;  Surgeon: Alexis Frock, MD;  Location: [facility];  Service: Urology;  Laterality: Right;   DILATION AND CURETTAGE OF UTERUS     X5   EXCISION LEFT BARTHOLIN GLAND  02-05-2002   EYE SURGERY     Retinal tears surgery bilateral  LAPAROSCOPIC ASSISTED VAGINAL HYSTERECTOMY  2000   LAPAROSCOPIC CHOLECYSTECTOMY  05-31-2007   LAPAROSCOPIC LASER ABLATION ENDOMETRIOSIS AND LEFT SALPINGO-OOPHORECTOMY  11-03-1999   LAPAROSCOPY WITH RIGHT SALPINGO-OOPHECTOMY/ LYSIS ADHESIONS AND ABLATION ENDOMETRIOSIS  05-17-2001   LIPOSUCTION Bilateral 03/26/2015   Procedure: WITH LIPOSUCTION;  Surgeon: Wallace Going, DO;  Location: [place];  Service: Plastics;  Laterality: Bilateral;   LUMBAR LAMINECTOMY  2003   L4 - L5   NECK SURGERY  2017   PLANTAR FASCIA SURGERY     right   PLANTAR FASCIA SURGERY  02/2011   left   REDUCTION MAMMAPLASTY     RIGHT URETEROSCOPIC STONE EXTRACTION  08-04-2000   SHOULDER ARTHROSCOPY WITH ROTATOR CUFF REPAIR Right 05/11/2020   Procedure: SHOULDER ARTHROSCOPY WITH ROTATOR CUFF REPAIR;  Surgeon: Lovell Sheehan, MD;  Location: [facility];  Service: Orthopedics;  Laterality: Right;   TONSILLECTOMY     URETERAL STENT PLACEMENT  11/12/11   Dr Tresa Moore    Social History   Socioeconomic History   Marital status: Married    Spouse name: Darnelle Maffucci   Number of children: 0   Years of education: college   Highest education level: Not on file  Occupational History   Occupation: disabled  Tobacco Use   Smoking status: Never   Smokeless tobacco: Never  Substance and Sexual Activity   Alcohol use:  Yes    Alcohol/week: 1.0 standard drink    Types: 1 Shots of liquor per week    Comment:  very rarely   Drug use: No   Sexual activity: Yes    Birth control/protection: Surgical  Other Topics Concern   Not on file  Social History Narrative   Patient Lives at home with her husband Darnelle Maffucci)   Disabled.   Education two years of college.   Right handed.   Caffeine coffee and sweet tea. Not daily.   Social Determinants of Health   Financial Resource Strain: Low Risk    Difficulty of Paying Living Expenses: Not hard at all  Food Insecurity: No Food Insecurity   Worried About Charity fundraiser in the Last Year: Never true   [place] in the Last Year: Never true  Transportation Needs: No Transportation Needs   Lack of Transportation (Medical): No   Lack of Transportation (Non-Medical): No  Physical Activity: Sufficiently Active   Days of Exercise per Week: 7 days   Minutes of Exercise per Session: 30 min  Stress: No Stress Concern Present   Feeling of Stress : Not at all  Social Connections: Unknown   Frequency of Communication with Friends and Family: More than three times a week   Frequency of Social Gatherings with Friends and Family: More than three times a week   Attends Religious Services: Patient refused   Active Member of Clubs or Organizations: Patient refused   Attends Archivist Meetings: Patient refused   Marital Status: Married    Family History  Problem Relation Age of Onset   Hypertension Mother    Colon polyps Mother    Atrial fibrillation Mother        Dr Angelena Form   Hyperlipidemia Mother    Heart disease Mother    Anxiety disorder Mother    Sleep apnea Mother    Obesity Mother    Heart attack Father 60   Diabetes Father    Hypertension Father    Hyperlipidemia Father    Heart disease Father    Stroke Father  Obesity Father    Hypertension Sister    Heart attack Paternal Grandmother 22   Breast cancer Paternal Aunt 66   Colon  cancer Maternal Uncle    Colon cancer Paternal Uncle    Colon polyps Maternal Grandmother    Stroke Maternal Grandmother 76   Diabetes Paternal Grandfather    Stroke Maternal Grandfather 37    Review of Systems  Constitutional:  Negative for chills and fever.  Eyes:  Negative for visual disturbance.  Respiratory:  Positive for cough (residual from bronchitis) and wheezing (occ). Negative for shortness of breath.   Cardiovascular:  Negative for chest pain, palpitations and leg swelling.  Gastrointestinal:  Positive for diarrhea. Negative for abdominal pain, blood in stool, constipation and nausea.       GERD controlled  Genitourinary:  Negative for dysuria.  Musculoskeletal:  Positive for arthralgias (shoulder) and back pain (back pain).  Skin:  Negative for color change and rash.  Neurological:  Negative for light-headedness and headaches.  Psychiatric/Behavioral:  Negative for dysphoric mood. The patient is nervous/anxious.       Objective:   Vitals:   10/26/20 1343  BP: 120/88  Pulse: 91  Temp: 98.2 F (36.8 C)  SpO2: 97%   Filed Weights   10/26/20 1343  Weight: 242 lb (109.8 kg)   Body mass index is 36.8 kg/m.  BP Readings from Last 3 Encounters:  10/26/20 120/88  10/26/20 120/88  09/14/20 120/76    Wt Readings from Last 3 Encounters:  10/26/20 242 lb (109.8 kg)  10/26/20 242 lb (109.8 kg)  09/14/20 235 lb (106.6 kg)    Depression screen [facility] 2/9 10/26/2020 10/26/2020 04/22/2020 12/24/2019 05/01/2019  Decreased Interest 0 0 0 0 0  Down, Depressed, Hopeless 0 0 0 0 0  PHQ - 2 Score 0 0 0 0 0  Altered sleeping 1 - 0 3 3  Tired, decreased energy 1 - _0 Change in appetite 0 - 0 0 0  Feeling bad or failure about yourself  0 - 0 0 0  Trouble concentrating 0 - 0 0 0  Moving slowly or fidgety/restless 0 - 0 0 0  Suicidal thoughts 0 - 0 0 0  PHQ-9 Score 2 - _1 Difficult doing work/chores Not difficult at all - Not difficult at all - -  Some recent data  might be hidden    GAD 7 : Generalized Anxiety Score 10/26/2020  Nervous, Anxious, on Edge 0  Control/stop worrying 0  Worry too much - different things 0  Trouble relaxing 0  Restless 0  Easily annoyed or irritable 0  Afraid - awful might happen 0  Total GAD 7 Score 0        Physical Exam Constitutional: [name]. No distress.  HENT:  Head: Normocephalic and atraumatic.  Right Ear: External ear normal. Normal ear canal and TM Left Ear: External ear normal.  Normal ear canal and TM Mouth/Throat: Oropharynx is clear and moist.  Eyes: Conjunctivae and EOM are normal.  Neck: Neck supple. No tracheal deviation present. No thyromegaly present.  No carotid bruit  Cardiovascular: Normal rate, regular rhythm and normal heart sounds.   No murmur heard.  No edema. Pulmonary/Chest: Effort normal and breath sounds normal. No respiratory distress. She has no wheezes. She has no rales.  Breast: deferred   Abdominal: Soft. She exhibits no distension. There is no tenderness.  Lymphadenopathy: She has no cervical adenopathy.  Skin: Skin  is warm and dry. She is not diaphoretic.  Psychiatric: She has a normal mood and affect. Her behavior is normal.        Assessment & Plan:   Physical exam: Screening blood work  reviewed labs from May Exercise  swimming regularly Diet  eating too little Weight  discussed weight loss - working on weight loss with healthy [facility] wellness clinic Substance abuse  none   She eats protein shake for breakfast and lunch.  She eats grilled chicken, veges, potatoes for dinner (very small amt).  Occasionally she states she will snack-peanut butter pretzels or nuts.  Advised that she do a 4-day calorie counting just to see where she is at.  Will get the second shingrix vaccine, had pneumonia vaccine at the pharmacy    Screened for depression using the PHQ 9 scale.  No evidence of depression.    Health Maintenance   Topic Date Due   COVID-19 Vaccine (4 - Booster for Pfizer series) 06/10/2020   Zoster Vaccines- Shingrix (2 of 2) 10/18/2020   Hepatitis C Screening  10/26/2021 (Originally 11/20/1988)   INFLUENZA VACCINE  11/02/2020   TETANUS/TDAP  08/15/2022   MAMMOGRAM  08/28/2022   COLONOSCOPY (Pts 45-31yr Insurance coverage will need to be confirmed)  07/14/2029   HIV Screening  Completed   HPV VACCINES  Aged Out   Pneumococcal Vaccine [id number] old  Discontinued          See Problem List for Assessment and Plan of chronic medical problems.

## 2020-10-25 NOTE — Patient Instructions (Addendum)
Blood work was ordered.     Medications changes include :   stop the omeprazole       Please followup in 6 months    Health Maintenance, Female Adopting a healthy lifestyle and getting preventive care are important in promoting health and wellness. Ask your health care provider about: The right schedule for you to have regular tests and exams. Things you can do on your own to prevent diseases and keep yourself healthy. What should I know about diet, weight, and exercise? Eat a healthy diet  Eat a diet that includes plenty of vegetables, fruits, low-fat dairy products, and lean protein. Do not eat a lot of foods that are high in solid fats, added sugars, or sodium.  Maintain a healthy weight Body mass index (BMI) is used to identify weight problems. It estimates body fat based on height and weight. Your health care provider can help determineyour BMI and help you achieve or maintain a healthy weight. Get regular exercise Get regular exercise. This is one of the most important things you can do for your health. Most adults should: Exercise for at least 150 minutes each week. The exercise should increase your heart rate and make you sweat (moderate-intensity exercise). Do strengthening exercises at least twice a week. This is in addition to the moderate-intensity exercise. Spend less time sitting. Even light physical activity can be beneficial. Watch cholesterol and blood lipids Have your blood tested for lipids and cholesterol at 51 years of age, then havethis test every 5 years. Have your cholesterol levels checked more often if: Your lipid or cholesterol levels are high. You are older than 51 years of age. You are at high risk for heart disease. What should I know about cancer screening? Depending on your health history and family history, you may need to have cancer screening at various ages. This may include screening for: Breast cancer. Cervical cancer. Colorectal  cancer. Skin cancer. Lung cancer. What should I know about heart disease, diabetes, and high blood pressure? Blood pressure and heart disease High blood pressure causes heart disease and increases the risk of stroke. This is more likely to develop in people who have high blood pressure readings, are of African descent, or are overweight. Have your blood pressure checked: Every 3-5 years if you are 13-27 years of age. Every year if you are 88 years old or older. Diabetes Have regular diabetes screenings. This checks your fasting blood sugar level. Have the screening done: Once every three years after age 89 if you are at a normal weight and have a low risk for diabetes. More often and at a younger age if you are overweight or have a high risk for diabetes. What should I know about preventing infection? Hepatitis B If you have a higher risk for hepatitis B, you should be screened for this virus. Talk with your health care provider to find out if you are at risk forhepatitis B infection. Hepatitis C Testing is recommended for: Everyone born from [birth year] through 1966. Anyone with known risk factors for hepatitis C. Sexually transmitted infections (STIs) Get screened for STIs, including gonorrhea and chlamydia, if: You are sexually active and are younger than 51 years of age. You are older than 51 years of age and your health care provider tells you that you are at risk for this type of infection. Your sexual activity has changed since you were last screened, and you are at increased risk for chlamydia or gonorrhea. Ask your health  care provider if you are at risk. Ask your health care provider about whether you are at high risk for HIV. Your health care provider may recommend a prescription medicine to help prevent HIV infection. If you choose to take medicine to prevent HIV, you should first get tested for HIV. You should then be tested every 3 months for as long as you are taking the  medicine. Pregnancy If you are about to stop having your period (premenopausal) and you may become pregnant, seek counseling before you get pregnant. Take 400 to 800 micrograms (mcg) of folic acid every day if you become pregnant. Ask for birth control (contraception) if you want to prevent pregnancy. Osteoporosis and menopause Osteoporosis is a disease in which the bones lose minerals and strength with aging. This can result in bone fractures. If you are 73 years old or older, or if you are at risk for osteoporosis and fractures, ask your health care provider if you should: Be screened for bone loss. Take a calcium or vitamin D supplement to lower your risk of fractures. Be given hormone replacement therapy (HRT) to treat symptoms of menopause. Follow these instructions at home: Lifestyle Do not use any products that contain nicotine or tobacco, such as cigarettes, e-cigarettes, and chewing tobacco. If you need help quitting, ask your health care provider. Do not use street drugs. Do not share needles. Ask your health care provider for help if you need support or information about quitting drugs. Alcohol use Do not drink alcohol if: Your health care provider tells you not to drink. You are pregnant, may be pregnant, or are planning to become pregnant. If you drink alcohol: Limit how much you use to 0-1 drink a day. Limit intake if you are breastfeeding. Be aware of how much alcohol is in your drink. In the [place] one drink equals one 12 oz bottle of beer (355 mL), one 5 oz glass of wine (148 mL), or one 1 oz glass of hard liquor (44 mL). General instructions Schedule regular health, dental, and eye exams. Stay current with your vaccines. Tell your health care provider if: You often feel depressed. You have ever been abused or do not feel safe at home. Summary Adopting a healthy lifestyle and getting preventive care are important in promoting health and wellness. Follow your health  care provider's instructions about healthy diet, exercising, and getting tested or screened for diseases. Follow your health care provider's instructions on monitoring your cholesterol and blood pressure. This information is not intended to replace advice given to you by your health care provider. Make sure you discuss any questions you have with your healthcare provider. Document Revised: 03/14/2018 Document Reviewed: 03/14/2018 Elsevier Patient Education  2022 [employer].

## 2020-10-26 ENCOUNTER — Ambulatory Visit (INDEPENDENT_AMBULATORY_CARE_PROVIDER_SITE_OTHER): Payer: PPO | Admitting: Internal Medicine

## 2020-10-26 ENCOUNTER — Ambulatory Visit (INDEPENDENT_AMBULATORY_CARE_PROVIDER_SITE_OTHER): Payer: PPO

## 2020-10-26 ENCOUNTER — Ambulatory Visit (INDEPENDENT_AMBULATORY_CARE_PROVIDER_SITE_OTHER): Payer: PPO | Admitting: Pharmacist

## 2020-10-26 ENCOUNTER — Other Ambulatory Visit: Payer: Self-pay

## 2020-10-26 VITALS — BP 120/88 | HR 91 | Temp 98.2°F | Ht 68.0 in | Wt 242.0 lb

## 2020-10-26 DIAGNOSIS — Z1331 Encounter for screening for depression: Secondary | ICD-10-CM

## 2020-10-26 DIAGNOSIS — F3289 Other specified depressive episodes: Secondary | ICD-10-CM | POA: Diagnosis not present

## 2020-10-26 DIAGNOSIS — F410 Panic disorder [episodic paroxysmal anxiety] without agoraphobia: Secondary | ICD-10-CM

## 2020-10-26 DIAGNOSIS — J4540 Moderate persistent asthma, uncomplicated: Secondary | ICD-10-CM | POA: Diagnosis not present

## 2020-10-26 DIAGNOSIS — I10 Essential (primary) hypertension: Secondary | ICD-10-CM | POA: Diagnosis not present

## 2020-10-26 DIAGNOSIS — Z0000 Encounter for general adult medical examination without abnormal findings: Secondary | ICD-10-CM

## 2020-10-26 DIAGNOSIS — K219 Gastro-esophageal reflux disease without esophagitis: Secondary | ICD-10-CM

## 2020-10-26 DIAGNOSIS — R7303 Prediabetes: Secondary | ICD-10-CM | POA: Diagnosis not present

## 2020-10-26 DIAGNOSIS — E8881 Metabolic syndrome: Secondary | ICD-10-CM

## 2020-10-26 DIAGNOSIS — J309 Allergic rhinitis, unspecified: Secondary | ICD-10-CM

## 2020-10-26 DIAGNOSIS — F419 Anxiety disorder, unspecified: Secondary | ICD-10-CM

## 2020-10-26 DIAGNOSIS — E88819 Insulin resistance, unspecified: Secondary | ICD-10-CM

## 2020-10-26 MED ORDER — OZEMPIC (0.25 OR 0.5 MG/DOSE) 2 MG/1.5ML ~~LOC~~ SOPN: 0.5000 mg | PEN_INJECTOR | SUBCUTANEOUS | 0 refills | Status: DC

## 2020-10-26 MED ORDER — METFORMIN HCL ER 500 MG PO TB24: 500.0000 mg | ORAL_TABLET | Freq: Two times a day (BID) | ORAL | 0 refills | Status: DC

## 2020-10-26 MED ORDER — LEVOCETIRIZINE DIHYDROCHLORIDE 5 MG PO TABS: 5.0000 mg | ORAL_TABLET | Freq: Every evening | ORAL | 1 refills | Status: DC

## 2020-10-26 NOTE — Progress Notes (Signed)
Subjective:   [name] is a 50 y.o. female who presents for Medicare Annual (Subsequent) preventive examination.  Review of Systems     Cardiac Risk Factors include: advanced age (>68mn, >>21women);family history of premature cardiovascular disease;hypertension;dyslipidemia;obesity (BMI >30kg/m2)     Objective:    Today's Vitals   10/26/20 1239 10/26/20 1240  BP:  120/88  Pulse:  91  Temp:  98.2 F (36.8 C)  TempSrc:  Temporal  SpO2:  97%  Weight:  242 lb (109.8 kg)  Height:  5' 8" (1.727 m)  PainSc: 0-No pain 0-No pain   Body mass index is 36.8 kg/m.  Advanced Directives 10/26/2020 05/11/2020 04/29/2020 07/29/2015 04/17/2015 03/26/2015 03/20/2015  Does Patient Have a Medical Advance Directive? Yes Yes Yes No No No No  Type of Advance Directive Living will;Healthcare Power of Attorney Living will Living will - - - -  Does patient want to make changes to medical advance directive? No - Patient declined - - - - - -  Copy of [place]? No - copy requested - - - - - -  Would patient like information on creating a medical advance directive? - - - Yes - Educational materials given Yes - EScientist, clinical (histocompatibility and immunogenetics)given No - patient declined information -  Pre-existing out of facility DNR order (yellow form or pink MOST form) - - - - - - -    Current Medications (verified) Outpatient Encounter Medications as of 10/26/2020  Medication Sig   albuterol (VENTOLIN HFA) 108 (90 Base) MCG/ACT inhaler TAKE 2 PUFFS BY MOUTH EVERY 6 HOURS AS NEEDED FOR WHEEZE OR SHORTNESS OF BREATH (Patient taking differently: Inhale 2 puffs into the lungs every 6 (six) hours as needed for shortness of breath or wheezing.)   aspirin EC 81 MG tablet Take 81 mg by mouth daily. Swallow whole.   Blood Glucose Monitoring Suppl (ONE TOUCH ULTRA 2) w/Device KIT OneTouch Ultra2 Meter   buPROPion (WELLBUTRIN SR) 150 MG 12 hr tablet Take 1 tablet (150 mg total) by mouth daily.   celecoxib  (CELEBREX) 200 MG capsule Take 200 mg by mouth 2 (two) times daily.   citalopram (CELEXA) 20 MG tablet TAKE 1 TABLET BY MOUTH EVERY DAY   diphenhydramine-acetaminophen (TYLENOL PM) 25-500 MG TABS tablet Take 2 tablets by mouth at bedtime.   famotidine (PEPCID) 20 MG tablet Take 1 tablet (20 mg total) by mouth at bedtime.   glucose blood (ONETOUCH ULTRA) test strip TEST BLOOD SUGARS ONCE A DAY   hydrochlorothiazide (MICROZIDE) 12.5 MG capsule TAKE 1 CAPSULE BY MOUTH EVERY DAY   hydroxychloroquine (PLAQUENIL) 200 MG tablet Take 200 mg by mouth 2 (two) times daily.   Insulin Pen Needle (BD PEN NEEDLE NANO U/F) 32G X 4 MM MISC 1 each by Other route daily.   liraglutide (VICTOZA) 18 MG/3ML SOPN Inject 1.8 mg into the skin every morning.   lisinopril (ZESTRIL) 40 MG tablet TAKE 1 TABLET BY MOUTH EVERY DAY   metFORMIN (GLUCOPHAGE) 500 MG tablet TAKE 1 TABLET BY MOUTH 2 TIMES DAILY WITH A MEAL.   montelukast (SINGULAIR) 10 MG tablet TAKE 1 TABLET BY MOUTH EVERYDAY AT BEDTIME   omeprazole (PRILOSEC) 20 MG capsule Take 1 capsule (20 mg total) by mouth daily.   OneTouch Delica Lancets 391QMISC 1 each by Does not apply route 2 (two) times daily.   topiramate (TOPAMAX) 50 MG tablet TAKE 1 TABLET BY MOUTH EVERYDAY AT BEDTIME   Vitamin D, Ergocalciferol, (  DRISDOL) 1.25 MG (50000 UNIT) CAPS capsule TAKE 1 CAPSULE (50,000 UNITS TOTAL) BY MOUTH EVERY 7 (SEVEN) DAYS   No facility-administered encounter medications on file as of 10/26/2020.    Allergies (verified) Chlorhexidine gluconate, Ivp dye [iodinated diagnostic agents], Nalbuphine, Septra [sulfamethoxazole-trimethoprim], Adhesive [tape], Diclofenac, Augmentin [amoxicillin-pot clavulanate], Betadine [povidone iodine], and Latex   History: Past Medical History:  Diagnosis Date   Anemia    Anxiety    Arthritis    inflammitory   Asthma    B12 deficiency    Back pain    Chronic joint pain    Constipation    DDD (degenerative disc disease), lumbar     low back and neck and shoulders   Depression    during divorce & legal matters   Fatty liver    Fibromyalgia    Gallbladder problem    GERD (gastroesophageal reflux disease)    History of kidney stones    Dr Phebe Colla   History of polycystic ovarian disease S/P BSO   Hx of anxiety disorder    Hypertension    a   Hypothyroidism    no meds   IBS (irritable bowel syndrome)    IBS (irritable bowel syndrome)    Infertility, female    Joint pain    Kidney problem    Lactose intolerance    Lupus (HCC) hx positive ANA   followed by Dr Gavin Pound; possible lupus   Multiple food allergies    Myalgia    Osteoarthritis    Polyarthritis, 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[place])    POLYCYSTIC OVARIAN DISEASE 03/02/2007   Qualifier: Diagnosis of  By: Linna Darner MD, Rae Mar BSO for cysts & endometriosis; Dr Reino Kent, Gyn Seeing Dr Paula Compton     PONV (postoperative nausea and vomiting)    Pre-diabetes    Prediabetes    Sweating profusely    Symptomatic mammary hypertrophy    URI (upper respiratory infection)    currently on cefdinir   Past Surgical History:  Procedure Laterality Date   ABDOMINAL HYSTERECTOMY     Agency   exploratory lap   BACK SURGERY     BREAST REDUCTION SURGERY Bilateral 03/26/2015   Procedure: BILATERAL BREAST REDUCTION ;  Surgeon: Wallace Going, DO;  Location: [place] [facility];  Service: Plastics;  Laterality: Bilateral;   CARPAL TUNNEL RELEASE     bilateral; Dr Sherwood Gambler   COLONOSCOPY     in [place]  07-09-2004   HX BILATERAL RENAL STONES/ RIGHT FLANK PAIN   CYSTOSCOPY W/ URETERAL STENT PLACEMENT  11/12/2011   Procedure: CYSTOSCOPY WITH RETROGRADE PYELOGRAM/URETERAL STENT PLACEMENT;  Surgeon: Alexis Frock, MD;  Location: [facility];  Service: Urology;  Laterality: Right;   CYSTOSCOPY W/ URETERAL STENT REMOVAL  12/07/2011   Procedure: CYSTOSCOPY WITH STENT REMOVAL;  Surgeon: Alexis Frock, MD;  [facility];  Service: Urology;  Laterality: Right;   CYSTOSCOPY/RETROGRADE/URETEROSCOPY/STONE EXTRACTION WITH BASKET  12/07/2011   Procedure: CYSTOSCOPY/RETROGRADE/URETEROSCOPY/STONE EXTRACTION WITH BASKET;  Surgeon: Alexis Frock, MD;  Location: [facility];  Service: Urology;  Laterality: Right;   DILATION AND CURETTAGE OF UTERUS     X5   EXCISION LEFT BARTHOLIN GLAND  02-05-2002   EYE SURGERY     Retinal tears surgery bilateral   LAPAROSCOPIC ASSISTED VAGINAL HYSTERECTOMY  2000   LAPAROSCOPIC CHOLECYSTECTOMY  05-31-2007   LAPAROSCOPIC LASER ABLATION ENDOMETRIOSIS AND  LEFT SALPINGO-OOPHORECTOMY  11-03-1999   LAPAROSCOPY WITH RIGHT SALPINGO-OOPHECTOMY/ LYSIS ADHESIONS AND ABLATION ENDOMETRIOSIS  05-17-2001   LIPOSUCTION Bilateral 03/26/2015   Procedure: WITH LIPOSUCTION;  Surgeon: Wallace Going, DO;  Location: [facility];  Service: Plastics;  Laterality: Bilateral;   LUMBAR LAMINECTOMY  2003   L4 - L5   NECK SURGERY  2017   PLANTAR FASCIA SURGERY     right   PLANTAR FASCIA SURGERY  02/2011   left   REDUCTION MAMMAPLASTY     RIGHT URETEROSCOPIC STONE EXTRACTION  08-04-2000   SHOULDER ARTHROSCOPY WITH ROTATOR CUFF REPAIR Right 05/11/2020   Procedure: SHOULDER ARTHROSCOPY WITH ROTATOR CUFF REPAIR;  Surgeon: Lovell Sheehan, MD;  Location: [facility];  Service: Orthopedics;  Laterality: Right;   TONSILLECTOMY     URETERAL STENT PLACEMENT  11/12/11   Dr Tresa Moore   Family History  Problem Relation Age of Onset   Hypertension Mother    Colon polyps Mother    Atrial fibrillation Mother        Dr Angelena Form   Hyperlipidemia Mother    Heart disease Mother    Anxiety disorder Mother    Sleep apnea Mother    Obesity Mother    Heart attack Father 54   Diabetes Father    Hypertension Father    Hyperlipidemia Father    Heart disease Father    Stroke Father    Obesity Father    Hypertension Sister    Heart attack Paternal  Grandmother 37   Breast cancer Paternal Aunt 28   Colon cancer Maternal Uncle    Colon cancer Paternal Uncle    Colon polyps Maternal Grandmother    Stroke Maternal Grandmother 76   Diabetes Paternal Grandfather    Stroke Maternal Grandfather 37   Social History   Socioeconomic History   Marital status: Married    Spouse name: Darnelle Maffucci   Number of children: 0   Years of education: college   Highest education level: Not on file  Occupational History   Occupation: disabled  Tobacco Use   Smoking status: Never   Smokeless tobacco: Never  Substance and Sexual Activity   Alcohol use: Yes    Alcohol/week: 1.0 standard drink    Types: 1 Shots of liquor per week    Comment:  very rarely   Drug use: No   Sexual activity: Yes    Birth control/protection: Surgical  Other Topics Concern   Not on file  Social History Narrative   Patient Lives at home with her husband Darnelle Maffucci)   Disabled.   Education two years of college.   Right handed.   Caffeine coffee and sweet tea. Not daily.   Social Determinants of Health   Financial Resource Strain: Low Risk    Difficulty of Paying Living Expenses: Not hard at all  Food Insecurity: No Food Insecurity   Worried About Charity fundraiser in the Last Year: Never true   [place] in the Last Year: Never true  Transportation Needs: No Transportation Needs   Lack of Transportation (Medical): No   Lack of Transportation (Non-Medical): No  Physical Activity: Sufficiently Active   Days of Exercise per Week: 7 days   Minutes of Exercise per Session: 30 min  Stress: No Stress Concern Present   Feeling of Stress : Not at all  Social Connections: Unknown   Frequency of Communication with Friends and Family: More than three times a week   Frequency of Social  Gatherings with Friends and Family: More than three times a week   Attends Religious Services: Patient refused   Active Member of Clubs or Organizations: Patient refused   Attends Arts development officer: Patient refused   Marital Status: Married    Tobacco Counseling Counseling given: Not Answered   Clinical Intake:  Pre-visit preparation completed: Yes  Pain : No/denies pain Pain Score: 0-No pain     BMI - recorded: 36.8 Nutritional Status: BMI > 30  Obese Nutritional Risks: None Diabetes: Yes (pre-diabetes) CBG done?: No Did pt. bring in CBG monitor from home?: No  How often do you need to have someone help you when you read instructions, pamphlets, or other written materials from your doctor or pharmacy?: 1 - Never What is the last grade level you completed in school?: 2 years of college at [employer]  Diabetic? Pre-diabetes  Interpreter Needed?: No  Information entered by :: Lisette Abu, LPN   Activities of Daily Living In your present state of health, do you have any difficulty performing the following activities: 10/26/2020 04/29/2020  Hearing? Y N  Comment right ear -  Vision? N N  Comment - wears glasses  Difficulty concentrating or making decisions? Y N  Comment has alot of brain fog -  Walking or climbing stairs? N Y  Dressing or bathing? N N  Doing errands, shopping? N N  Preparing Food and eating ? N -  Using the Toilet? N -  In the past six months, have you accidently leaked urine? N -  Do you have problems with loss of bowel control? N -  Managing your Medications? N -  Managing your Finances? N -  Housekeeping or managing your Housekeeping? N -  Some recent data might be hidden    Patient Care Team: Binnie Rail, MD as PCP - General (Internal Medicine) Gavin Pound, MD as Consulting Physician (Rheumatology) Charlton Haws, [facility] as Pharmacist (Pharmacist)  Indicate any recent Medical Services you may have received from other than [facility] providers in the past year (date may be approximate).     Assessment:   This is a routine wellness examination for [place].  Hearing/Vision screen Hearing Screening -  Comments:: Patient stated that she has noticed a decline in hearing in the right ear.  Family history of right ear deafness. Vision Screening - Comments:: Patient wear glasses.  Eye exams done by Dr. Juanetta Snow.  Dietary issues and exercise activities discussed: Current Exercise Habits: Home exercise routine, Type of exercise: walking;Other - see comments (water aerobics in the morning & afternoon everyday), Time (Minutes): 30, Frequency (Times/Week): 7, Weekly Exercise (Minutes/Week): 210, Intensity: Moderate   Goals Addressed               This Visit's Progress     Patient Stated (pt-stated)        My goal is to get rid of the extra weight around my waist.      Depression Screen PHQ 2/9 Scores 10/26/2020 04/22/2020 12/24/2019 05/01/2019 09/28/2016 07/29/2015 09/22/2014  PHQ - 2 Score 0 0 0 0 3 0 0  PHQ- 9 Score - _0 - -    Fall Risk Fall Risk  10/26/2020 05/01/2019 07/29/2015 09/22/2014  Falls in the past year? 1 1 Yes No  Number falls in past yr: _1 or more -  Injury with Fall? 0 0 No -  Follow up - - Education provided -    FALL  RISK PREVENTION PERTAINING TO THE HOME:  Any stairs in or around the home? No  If so, are there any without handrails? No  Home free of loose throw rugs in walkways, pet beds, electrical cords, etc? Yes  Adequate lighting in your home to reduce risk of falls? Yes   ASSISTIVE DEVICES UTILIZED TO PREVENT FALLS:  Life alert? No  Use of a cane, walker or w/c? No  Grab bars in the bathroom? No  Shower chair or bench in shower? No  Elevated toilet seat or a handicapped toilet? No   TIMED UP AND GO:  Was the test performed? Yes .  Length of time to ambulate 10 feet: 8 sec.   Gait steady and fast without use of assistive device  Cognitive Function: Normal cognitive status assessed by direct observation by this Nurse Health Advisor. No abnormalities found.   MMSE - Mini Mental State Exam 09/22/2014  Not completed: Unable to complete         Immunizations Immunization History  Administered Date(s) Administered   Influenza Split 02/17/2011   Influenza Whole 01/02/2010   Influenza,inj,Quad PF,6+ Mos 01/02/2013, 01/11/2017, 01/03/2018, 02/07/2019   Influenza-Unspecified 01/01/2014, 01/08/2016, 01/11/2017, 03/04/2020   PFIZER(Purple Top)SARS-COV-2 Vaccination 07/16/2019, 08/06/2019, 03/12/2020   Pneumococcal Polysaccharide-23 06/04/2008, 05/02/2013   Tdap 08/14/2012   Zoster Recombinat (Shingrix) 08/23/2020    TDAP status: Up to date  Flu Vaccine status: Up to date  Pneumococcal vaccine status: Up to date  Covid-19 vaccine status: Completed vaccines  Qualifies for Shingles Vaccine? Yes   Zostavax completed No   Shingrix Completed?: Yes  Screening Tests Health Maintenance  Topic Date Due   Hepatitis C Screening  Never done   Pneumococcal Vaccine 69-78 Years old (3 - PCV) 05/02/2014   COVID-19 Vaccine (4 - Booster for Pfizer series) 06/10/2020   Zoster Vaccines- Shingrix (2 of 2) 10/18/2020   INFLUENZA VACCINE  11/02/2020   TETANUS/TDAP  08/15/2022   MAMMOGRAM  08/28/2022   COLONOSCOPY (Pts 45-8yr Insurance coverage will need to be confirmed)  07/14/2029   HIV Screening  Completed   HPV VACCINES  Aged Out    Health Maintenance  Health Maintenance Due  Topic Date Due   Hepatitis C Screening  Never done   Pneumococcal Vaccine 040640Years old (3 - PCV) 05/02/2014   COVID-19 Vaccine (4 - Booster for Pfizer series) 06/10/2020   Zoster Vaccines- Shingrix (2 of 2) 10/18/2020    Colorectal cancer screening: Type of screening: Colonoscopy. Completed 07/15/2019. Repeat every 10 years  Mammogram status: Completed 08/27/2020. Repeat every year  Bone density status: never done  Lung Cancer Screening: (Low Dose CT Chest recommended if Age 416-80years, 30 pack-year currently smoking OR have quit w/in 15years.) does not qualify.   Lung Cancer Screening Referral: no  Additional Screening:  Hepatitis C  Screening: does qualify; Completed no  Vision Screening: Recommended annual ophthalmology exams for early detection of glaucoma and other disorders of the eye. Is the patient up to date with their annual eye exam?  Yes  Who is the provider or what is the name of the office in which the patient attends annual eye exams? Dr. WMitzie NaRCathey Endow If pt is not established with a provider, would they like to be referred to a provider to establish care? No .   Dental Screening: Recommended annual dental exams for proper oral hygiene  Community Resource Referral / Chronic Care Management: CRR required this visit?  No   CCM required this visit?  No      Plan:     I have personally reviewed and noted the following in the patient's chart:   Medical and social history Use of alcohol, tobacco or illicit drugs  Current medications and supplements including opioid prescriptions.  Functional ability and status Nutritional status Physical activity Advanced directives List of other physicians Hospitalizations, surgeries, and ER visits in previous 12 months Vitals Screenings to include cognitive, depression, and falls Referrals and appointments  In addition, I have reviewed and discussed with patient certain preventive protocols, quality metrics, and best practice recommendations. A written personalized care plan for preventive services as well as general preventive health recommendations were provided to patient.     Sheral Flow, LPN   8/33/8250   Nurse Notes: n/a

## 2020-10-26 NOTE — Assessment & Plan Note (Addendum)
Chronic GERD controlled-may not need to medications Currently taking Pepcid 20 mg at bedtime and omeprazole 20 mg in the morning Continue pepcid 20 mg qd daily - she will try stopping the omeprazole for now Advised that if her GERD is not controlled with the Pepcid alone she needs to contact me Working on weight loss

## 2020-10-26 NOTE — Patient Instructions (Addendum)
Thank you for meeting with me to discuss your medications! I look forward to working with you to achieve your health care goals. Below is a summary of what we talked about during the visit:  Stop Topiramate (trial off medication, it does not seem to be helping with weight loss)  Reduce bupropion to every other day for week, then stop (trial off medication, it does not seem to be helping with weight loss)  We will switch metformin to XR to try to help with diarrhea.  We will try to switch Victoza to Ozempic for greater weight loss. Start with 0.5 mg once a week. We will increase this gradually to maximum effect.  Try Levocetirizine 5 mg (Xyzal) at night for allergies.   Phone number for Pharmacist: [phone]    Goals Addressed             This Visit's Progress    Manage My Medicine       Timeframe:  Long-Range Goal Priority:  Medium Start Date:    10/26/20                         Expected End Date:     04/28/21                  Follow Up Date Aug 2022   - call for medicine refill 2 or 3 days before it runs out - call if I am sick and can't take my medicine - keep a list of all the medicines I take; vitamins and herbals too  -Stop Topiramate -Reduce bupropion to every other day for 1 week, then stop -Pick up Metformin XR (new formulation) from pharmacy -Stop Victoza. Start Ozempic 0.5 mg once a week. -Try levocetirizine 5 mg at bedtime for allergies   Why is this important?   These steps will help you keep on track with your medicines.   Notes:         Ms. Mountford was given information about Chronic Care Management services today including:  CCM service includes personalized support from designated clinical staff supervised by her physician, including individualized plan of care and coordination with other care providers 24/7 contact phone numbers for assistance for urgent and routine care needs. Standard insurance, coinsurance, copays and deductibles apply for  chronic care management only during months in which we provide at least 20 minutes of these services. Most insurances cover these services at 100%, however patients may be responsible for any copay, coinsurance and/or deductible if applicable. This service may help you avoid the need for more expensive face-to-face services. Only one practitioner may furnish and bill the service in a calendar month. The patient may stop CCM services at any time (effective at the end of the month) by phone call to the office staff.  Patient agreed to services and verbal consent obtained.   Patient verbalizes understanding of instructions provided today and agrees to view in [place].  Telephone follow up appointment with pharmacy team member scheduled for: 1 month  Charlene Brooke, PharmD, [place], CPP Clinical Pharmacist [facility] [phone]

## 2020-10-26 NOTE — Assessment & Plan Note (Signed)
Chronic Well-controlled, stable GAD-7 score 0 Continue citalopram 20 mg daily

## 2020-10-26 NOTE — Patient Instructions (Signed)
Ms. Mckercher , Thank you for taking time to come for your Medicare Wellness Visit. I appreciate your ongoing commitment to your health goals. Please review the following plan we discussed and let me know if I can assist you in the future.   Screening recommendations/referrals: Colonoscopy: 07/15/2019; due every 10 years Mammogram: 08/27/2020; due every 1-2 years Bone Density: never done Recommended yearly ophthalmology/optometry visit for glaucoma screening and checkup Recommended yearly dental visit for hygiene and checkup  Vaccinations: Influenza vaccine: 03/04/2020; due Fall 2022 Pneumococcal vaccine: 05/02/2013 (Pneumovax 23); Prevnar 13 due at age 70. Tdap vaccine: 08/14/2012; due every 10 years; due 2024 Shingles vaccine: 08/23/2020 Covid-19: 07/16/2019, 08/06/2019, 03/12/2020  Advanced directives: Please bring a copy of your health care power of attorney and living will to the office at your convenience.  Conditions/risks identified: Client understands the importance of follow-up with providers by attending scheduled visits and discussed goals to eat healthier, increase physical activity, exercise the brain, socialize more, get enough rest and make time for laughter.  Next appointment: Please schedule your next Medicare Wellness Visit with your Nurse Health Advisor in 1 year by calling [phone].  Preventive Care 40-64 Years, Female Preventive care refers to lifestyle choices and visits with your health care provider that can promote health and wellness. What does preventive care include? A yearly physical exam. This is also called an annual well check. Dental exams once or twice a year. Routine eye exams. Ask your health care provider how often you should have your eyes checked. Personal lifestyle choices, including: Daily care of your teeth and gums. Regular physical activity. Eating a healthy diet. Avoiding tobacco and drug use. Limiting alcohol use. Practicing safe sex. Taking  low-dose aspirin daily starting at age 27. Taking vitamin and mineral supplements as recommended by your health care provider. What happens during an annual well check? The services and screenings done by your health care provider during your annual well check will depend on your age, overall health, lifestyle risk factors, and family history of disease. Counseling  Your health care provider may ask you questions about your: Alcohol use. Tobacco use. Drug use. Emotional well-being. Home and relationship well-being. Sexual activity. Eating habits. Work and work Statistician. Method of birth control. Menstrual cycle. Pregnancy history. Screening  You may have the following tests or measurements: Height, weight, and BMI. Blood pressure. Lipid and cholesterol levels. These may be checked every 5 years, or more frequently if you are over 42 years old. Skin check. Lung cancer screening. You may have this screening every year starting at age 33 if you have a 30-pack-year history of smoking and currently smoke or have quit within the past 15 years. Fecal occult blood test (FOBT) of the stool. You may have this test every year starting at age 69. Flexible sigmoidoscopy or colonoscopy. You may have a sigmoidoscopy every 5 years or a colonoscopy every 10 years starting at age 33. Hepatitis C blood test. Hepatitis B blood test. Sexually transmitted disease (STD) testing. Diabetes screening. This is done by checking your blood sugar (glucose) after you have not eaten for a while (fasting). You may have this done every 1-3 years. Mammogram. This may be done every 1-2 years. Talk to your health care provider about when you should start having regular mammograms. This may depend on whether you have a family history of breast cancer. BRCA-related cancer screening. This may be done if you have a family history of breast, ovarian, tubal, or peritoneal cancers. Pelvic exam and  Pap test. This may be done  every 3 years starting at age 41. Starting at age 33, this may be done every 5 years if you have a Pap test in combination with an HPV test. Bone density scan. This is done to screen for osteoporosis. You may have this scan if you are at high risk for osteoporosis. Discuss your test results, treatment options, and if necessary, the need for more tests with your health care provider. Vaccines  Your health care provider may recommend certain vaccines, such as: Influenza vaccine. This is recommended every year. Tetanus, diphtheria, and acellular pertussis (Tdap, Td) vaccine. You may need a Td booster every 10 years. Zoster vaccine. You may need this after age 63. Pneumococcal 13-valent conjugate (PCV13) vaccine. You may need this if you have certain conditions and were not previously vaccinated. Pneumococcal polysaccharide (PPSV23) vaccine. You may need one or two doses if you smoke cigarettes or if you have certain conditions. Talk to your health care provider about which screenings and vaccines you need and how often you need them. This information is not intended to replace advice given to you by your health care provider. Make sure you discuss any questions you have with your health care provider. Document Released: 04/17/2015 Document Revised: 12/09/2015 Document Reviewed: 01/20/2015 Elsevier Interactive Patient Education  2017 [place] Prevention in the Home Falls can cause injuries. They can happen to people of all ages. There are many things you can do to make your home safe and to help prevent falls. What can I do on the outside of my home? Regularly fix the edges of walkways and driveways and fix any cracks. Remove anything that might make you trip as you walk through a door, such as a raised step or threshold. Trim any bushes or trees on the path to your home. Use bright outdoor lighting. Clear any walking paths of anything that might make someone trip, such as rocks or  tools. Regularly check to see if handrails are loose or broken. Make sure that both sides of any steps have handrails. Any raised decks and porches should have guardrails on the edges. Have any leaves, snow, or ice cleared regularly. Use sand or salt on walking paths during winter. Clean up any spills in your garage right away. This includes oil or grease spills. What can I do in the bathroom? Use night lights. Install grab bars by the toilet and in the tub and shower. Do not use towel bars as grab bars. Use non-skid mats or decals in the tub or shower. If you need to sit down in the shower, use a plastic, non-slip stool. Keep the floor dry. Clean up any water that spills on the floor as soon as it happens. Remove soap buildup in the tub or shower regularly. Attach bath mats securely with double-sided non-slip rug tape. Do not have throw rugs and other things on the floor that can make you trip. What can I do in the bedroom? Use night lights. Make sure that you have a light by your bed that is easy to reach. Do not use any sheets or blankets that are too big for your bed. They should not hang down onto the floor. Have a firm chair that has side arms. You can use this for support while you get dressed. Do not have throw rugs and other things on the floor that can make you trip. What can I do in the kitchen? Clean up any  spills right away. Avoid walking on wet floors. Keep items that you use a lot in easy-to-reach places. If you need to reach something above you, use a strong step stool that has a grab bar. Keep electrical cords out of the way. Do not use floor polish or wax that makes floors slippery. If you must use wax, use non-skid floor wax. Do not have throw rugs and other things on the floor that can make you trip. What can I do with my stairs? Do not leave any items on the stairs. Make sure that there are handrails on both sides of the stairs and use them. Fix handrails that are  broken or loose. Make sure that handrails are as long as the stairways. Check any carpeting to make sure that it is firmly attached to the stairs. Fix any carpet that is loose or worn. Avoid having throw rugs at the top or bottom of the stairs. If you do have throw rugs, attach them to the floor with carpet tape. Make sure that you have a light switch at the top of the stairs and the bottom of the stairs. If you do not have them, ask someone to add them for you. What else can I do to help prevent falls? Wear shoes that: Do not have high heels. Have rubber bottoms. Are comfortable and fit you well. Are closed at the toe. Do not wear sandals. If you use a stepladder: Make sure that it is fully opened. Do not climb a closed stepladder. Make sure that both sides of the stepladder are locked into place. Ask someone to hold it for you, if possible. Clearly mark and make sure that you can see: Any grab bars or handrails. First and last steps. Where the edge of each step is. Use tools that help you move around (mobility aids) if they are needed. These include: Canes. Walkers. Scooters. Crutches. Turn on the lights when you go into a dark area. Replace any light bulbs as soon as they burn out. Set up your furniture so you have a clear path. Avoid moving your furniture around. If any of your floors are uneven, fix them. If there are any pets around you, be aware of where they are. Review your medicines with your doctor. Some medicines can make you feel dizzy. This can increase your chance of falling. Ask your doctor what other things that you can do to help prevent falls. This information is not intended to replace advice given to you by your health care provider. Make sure you discuss any questions you have with your health care provider. Document Released: 01/15/2009 Document Revised: 08/27/2015 Document Reviewed: 04/25/2014 Elsevier Interactive Patient Education  2017 [employer].

## 2020-10-26 NOTE — Assessment & Plan Note (Signed)
Chronic Lab Results  Component Value Date   HGBA1C 5.8 (H) 08/13/2020   She is swimming and working on weight loss

## 2020-10-26 NOTE — Assessment & Plan Note (Addendum)
Chronic BP well controlled Continue HCTZ 12.5 mg daily, lisinopril 40 mg daily

## 2020-10-26 NOTE — Progress Notes (Signed)
Chronic Care Management Pharmacy Note  10/26/2020 Name:  [name] MRN:  [id number] DOB:  March 15, 1971  Summary: -Weight is stagnant despite 3+ years on topiramate, bupropion and Victoza and significant lifestyle improvements -Bupropion and topiramate in particular have limited benefit with wt loss and likely can be discontinued -Pt is having diarrhea with metformin -Montelukast is not helping as much anymore; pt asked for alternative  Recommendations/Changes made from today's visit: -Trial off topiramate and bupropion - can stop topiramate, taper bupropion by reducing to every other day x 1 week then stop -Switch Victoza to Ozempic for greater wt loss benefit -Switch metformin to XR -Try Levocetirizine 5 mg at bedtime for allergies *consulted with prescriber Dr Leafy Ro (wt mgmt) regarding changes*  Subjective: [name] is an 50 y.o. year old female who is a primary patient of Burns, Claudina Lick, MD.  The CCM team was consulted for assistance with disease management and care coordination needs.    Engaged with patient face to face for initial visit in response to provider referral for pharmacy case management and/or care coordination services.   Consent to Services:  The patient was given the following information about Chronic Care Management services today, agreed to services, and gave verbal consent: 1. CCM service includes personalized support from designated clinical staff supervised by the primary care provider, including individualized plan of care and coordination with other care providers 2. 24/7 contact phone numbers for assistance for urgent and routine care needs. 3. Service will only be billed when office clinical staff spend 20 minutes or more in a month to coordinate care. 4. Only one practitioner may furnish and bill the service in a calendar month. 5.The patient may stop CCM services at any time (effective at the end of the month) by phone call to the office staff.  6. The patient will be responsible for cost sharing (co-pay) of up to 20% of the service fee (after annual deductible is met). Patient agreed to services and consent obtained.  Patient Care Team: Binnie Rail, MD as PCP - General (Internal Medicine) Gavin Pound, MD as Consulting Physician (Rheumatology) Charlton Haws, [facility] as Pharmacist (Pharmacist)  Recent office visits: 10/12/20 Dr Mitchel Honour VV: [facility] - rx'd zpak, tessalon and promethazine-DM.  04/28/20 Dr Quay Burow OV: pre-op clearance for shoulder surgery. A1c in prediabetic range. No changes.  Recent consult visits: 09/14/20 Dr Leafy Ro (wt mgmt): visit #59, wt loss 3 lbs.  08/10/20 Dr Trudie Reed (rheumatology): f/u inflammatory polyarthropathy  Feb-April 2022 - PT for shoulder  Hospital visits: 05/11/20 Admission for Shoulder surgery   Objective:  Lab Results  Component Value Date   CREATININE 0.80 08/13/2020   BUN 25 (H) 08/13/2020   GFR 69.89 04/28/2020   GFRNONAA 109 11/11/2019   GFRAA 125 11/11/2019   NA 140 08/13/2020   K 4.1 08/13/2020   CALCIUM 9.2 08/13/2020   CO2 22 08/13/2020   GLUCOSE 75 08/13/2020    Lab Results  Component Value Date/Time   HGBA1C 5.8 (H) 08/13/2020 12:50 PM   HGBA1C 5.9 04/28/2020 02:44 PM   GFR 69.89 04/28/2020 02:44 PM   GFR 103.96 08/16/2016 10:11 AM    Last diabetic Eye exam: No results found for: HMDIABEYEEXA  Last diabetic Foot exam: No results found for: HMDIABFOOTEX   Lab Results  Component Value Date   CHOL 155 08/13/2020   HDL 53 08/13/2020   LDLCALC 82 08/13/2020   TRIG 112 08/13/2020   CHOLHDL 3 08/16/2016    Hepatic Function  Latest Ref Rng & Units 08/13/2020 04/28/2020 11/11/2019  Total Protein 6.0 - 8.5 g/dL 6.7 7.5 6.3  Albumin 3.8 - 4.8 g/dL 4.0 4.4 3.9  AST 0 - 40 IU/L '24 19 20  ' ALT 0 - 32 IU/L '30 21 19  ' Alk Phosphatase 44 - 121 IU/L 94 84 68  Total Bilirubin 0.0 - 1.2 mg/dL 0.3 0.3 <0.2  Bilirubin, Direct 0.0 - 0.3 mg/dL - - -    Lab Results  Component  Value Date/Time   TSH 2.040 08/13/2020 12:50 PM   TSH 1.800 11/11/2019 10:49 AM   FREET4 0.92 08/13/2020 12:50 PM   FREET4 0.83 11/11/2019 10:49 AM    CBC Latest Ref Rng & Units 08/13/2020 04/28/2020 11/11/2019  WBC 3.4 - 10.8 x10E3/uL 6.7 13.3(H) 5.8  Hemoglobin 11.1 - 15.9 g/dL 13.0 13.6 12.2  Hematocrit 34.0 - 46.6 % 38.8 42.2 37.2  Platelets 150 - 450 x10E3/uL 266 329.0 246    Lab Results  Component Value Date/Time   VD25OH 34.8 08/13/2020 12:50 PM   VD25OH 36.7 04/30/2020 08:13 AM    Clinical ASCVD: No  The 10-year ASCVD risk score Mikey Bussing DC Jr., et al., 2013) is: 1.1%   Values used to calculate the score:     Age: 76 years     Sex: Female     Is Non-Hispanic African American: No     Diabetic: No     Tobacco smoker: No     Systolic Blood Pressure: 818 mmHg     Is BP treated: Yes     HDL Cholesterol: 53 mg/dL     Total Cholesterol: 155 mg/dL    Depression screen [facility] 2/9 10/26/2020 10/26/2020 04/22/2020  Decreased Interest 0 0 0  Down, Depressed, Hopeless 0 0 0  PHQ - 2 Score 0 0 0  Altered sleeping 1 - 0  Tired, decreased energy 1 - 1  Change in appetite 0 - 0  Feeling bad or failure about yourself  0 - 0  Trouble concentrating 0 - 0  Moving slowly or fidgety/restless 0 - 0  Suicidal thoughts 0 - 0  PHQ-9 Score 2 - 1  Difficult doing work/chores Not difficult at all - Not difficult at all  Some recent data might be hidden     Social History   Tobacco Use  Smoking Status Never  Smokeless Tobacco Never   BP Readings from Last 3 Encounters:  10/26/20 120/88  10/26/20 120/88  09/14/20 120/76   Pulse Readings from Last 3 Encounters:  10/26/20 91  10/26/20 91  09/14/20 83   Wt Readings from Last 3 Encounters:  10/26/20 242 lb (109.8 kg)  10/26/20 242 lb (109.8 kg)  09/14/20 235 lb (106.6 kg)   BMI Readings from Last 3 Encounters:  10/26/20 36.80 kg/m  10/26/20 36.80 kg/m  09/14/20 35.73 kg/m    Assessment/Interventions: Review of patient past  medical history, allergies, medications, health status, including review of consultants reports, laboratory and other test data, was performed as part of comprehensive evaluation and provision of chronic care management services.   SDOH:  (Social Determinants of Health) assessments and interventions performed: Yes  SDOH Screenings   Alcohol Screen: Low Risk    Last Alcohol Screening Score (AUDIT): 2  Depression (PHQ2-9): Low Risk    PHQ-2 Score: 2  Financial Resource Strain: Low Risk    Difficulty of Paying Living Expenses: Not hard at all  Food Insecurity: No Food Insecurity   Worried About Charity fundraiser in  the Last Year: Never true   Ran Out of Food in the Last Year: Never true  Housing: [place] Risk Score: 0  Physical Activity: Sufficiently Active   Days of Exercise per Week: 7 days   Minutes of Exercise per Session: 30 min  Social Connections: Unknown   Frequency of Communication with Friends and Family: More than three times a week   Frequency of Social Gatherings with Friends and Family: More than three times a week   Attends Religious Services: Patient refused   Marine scientist or Organizations: Patient refused   Attends Music therapist: Patient refused   Marital Status: Married  Stress: No Stress Concern Present   Feeling of Stress : Not at all  Tobacco Use: Low Risk    Smoking Tobacco Use: Never   Smokeless Tobacco Use: Never  Transportation Needs: No Transportation Needs   Lack of Transportation (Medical): No   Lack of Transportation (Non-Medical): No    CCM Care Plan  Allergies  Allergen Reactions   Chlorhexidine Gluconate Rash, Itching and Swelling    Developed severe rash where chloraprep was used on chest area   Ivp Dye [Iodinated Diagnostic Agents] Rash and Other (See Comments)    Flushing, minor facial rash & dyspnea   Nalbuphine Shortness Of Breath and Rash    Nubain caused respiratory distress & rash   Septra  [Sulfamethoxazole-Trimethoprim] Shortness Of Breath and Rash   Adhesive [Tape] Rash   Diclofenac Other (See Comments)    GI Upset   Augmentin [Amoxicillin-Pot Clavulanate] Nausea And Vomiting   Betadine [Povidone Iodine] Rash   Latex Rash    Medications Reviewed Today     Reviewed by Charlton Haws, [facility] (Pharmacist) on 10/26/20 at [place] Status: <None>   Medication Order Taking? Sig Documenting Provider Last Dose Status Informant  albuterol (VENTOLIN HFA) 108 (90 Base) MCG/ACT inhaler [id number] Yes TAKE 2 PUFFS BY MOUTH EVERY 6 HOURS AS NEEDED FOR WHEEZE OR SHORTNESS OF BREATH  Patient taking differently: Inhale 2 puffs into the lungs every 6 (six) hours as needed for shortness of breath or wheezing.   Binnie Rail, MD Taking Active   Blood Glucose Monitoring Suppl (ONE TOUCH ULTRA 2) w/Device Drucie Opitz [id number] Yes OneTouch Ultra2 Meter [provider] Taking Active Self  buPROPion [facility] SR) 150 MG 12 hr tablet [id number] Yes Take 1 tablet (150 mg total) by mouth daily. Dennard Nip D, MD Taking Active            Med Note Luna Glasgow Oct 26, 2020  3:29 PM) TAPER: reduce to every other day x 1 week then stop (end date 11/02/20)  celecoxib (CELEBREX) 200 MG capsule [id number] Yes Take 200 mg by mouth 2 (two) times daily. [provider] Taking Active Self  citalopram (CELEXA) 20 MG tablet [id number] Yes TAKE 1 TABLET BY MOUTH EVERY DAY Burns, Claudina Lick, MD Taking Active   diphenhydramine-acetaminophen (TYLENOL PM) 25-500 MG TABS tablet [id number] Yes Take 2 tablets by mouth at bedtime. [provider] Taking Active Self  famotidine (PEPCID) 20 MG tablet [id number] Yes Take 1 tablet (20 mg total) by mouth at bedtime. Binnie Rail, MD Taking Active Self  glucose blood [facility] ULTRA) test strip [id number] Yes TEST BLOOD SUGARS ONCE A DAY Leafy Ro, Caren D, MD Taking Active Self  hydrochlorothiazide (MICROZIDE) 12.5 MG capsule [id number] Yes  TAKE 1 CAPSULE BY MOUTH EVERY DAY Burns,  Claudina Lick, MD Taking Active Self  hydroxychloroquine (PLAQUENIL) 200 MG tablet [id number] Yes Take 200 mg by mouth 2 (two) times daily. [provider] Taking Active Self  Insulin Pen Needle (BD PEN NEEDLE NANO U/F) 32G X 4 MM MISC [id number] Yes 1 each by Other route daily. Dennard Nip D, MD Taking Active   levocetirizine (XYZAL) 5 MG tablet [id number] Yes Take 1 tablet (5 mg total) by mouth every evening. Binnie Rail, MD  Active   lisinopril (ZESTRIL) 40 MG tablet [id number] Yes TAKE 1 TABLET BY MOUTH EVERY DAY Burns, Claudina Lick, MD Taking Active   metFORMIN (GLUCOPHAGE XR) 500 MG 24 hr tablet [id number] Yes Take 1 tablet (500 mg total) by mouth 2 (two) times daily before a meal. Binnie Rail, MD  Active   montelukast (SINGULAIR) 10 MG tablet [id number] Yes TAKE 1 TABLET BY MOUTH EVERYDAY AT BEDTIME Binnie Rail, MD Taking Active Self  omeprazole (PRILOSEC) 20 MG capsule [id number] Yes Take 1 capsule (20 mg total) by mouth daily. Binnie Rail, MD Taking Active Self  OneTouch Delica Lancets 69I MISC [id number] Yes 1 each by Does not apply route 2 (two) times daily. Dennard Nip D, MD Taking Active   Semaglutide,0.25 or 0.5MG/DOS, (OZEMPIC, 0.25 OR 0.5 MG/DOSE,) 2 MG/1.5ML SOPN [id number] Yes Inject 0.5 mg into the skin once a week. 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   Depression 16/55/3748   Umbilical hernia 27/10/8673   Diverticulosis of colon 02/26/2017   Other hyperlipidemia  02/06/2017   Asthma 11/24/2016   Vitamin D deficiency 11/09/2016   Class 1 obesity with serious comorbidity and body mass index (BMI) of 33.0 to 33.9 in adult 11/09/2016   Right shoulder pain 10/11/2016   AC (acromioclavicular) joint arthritis 09/13/2016   Postsurgical menopause 08/16/2016   Mucoid cyst of joint 11/04/2015   External hemorrhoid 08/20/2015   Prediabetes 06/05/2015   GERD (gastroesophageal reflux disease) 06/04/2015   Status post bilateral breast reduction 04/01/2015   Cervical disc disorder with radiculopathy of cervical region 05/23/2014   Ulnar neuropathy 01/16/2014   Fibromyalgia 01/16/2014   Recurrent nephrolithiasis 04/29/2013   Arthralgia 04/29/2013   Panic attacks 01/15/2013   IBS (irritable bowel syndrome) 12/14/2011   B12 deficiency 06/30/2009   Essential hypertension 03/02/2007    Immunization History  Administered Date(s) Administered   Influenza Split 02/17/2011   Influenza Whole 01/02/2010   Influenza,inj,Quad PF,6+ Mos 01/02/2013, 01/11/2017, 01/03/2018, 02/07/2019   Influenza-Unspecified 01/01/2014, 01/08/2016, 01/11/2017, 03/04/2020   PFIZER(Purple Top)SARS-COV-2 Vaccination 07/16/2019, 08/06/2019, 03/12/2020   Pneumococcal Polysaccharide-23 06/04/2008, 05/02/2013   Tdap 08/14/2012   Zoster Recombinat (Shingrix) 08/23/2020    Conditions to be addressed/monitored:  Hypertension, Hyperlipidemia, Asthma, and Anxiety, Obesity, Prediabetes  Care Plan : [place]  Updates made by Charlton Haws, [place] since 10/26/2020 12:00 AM     Problem: Hypertension, Hyperlipidemia, Asthma, and Anxiety, Obesity, Prediabetes   Priority: High     Long-Range Goal: Disease management   Start  Date: 10/26/2020  Expected End Date: 10/26/2021  This Visit's Progress: On track  Priority: High  Note:   Current Barriers:  Unable to independently monitor therapeutic efficacy Suboptimal therapeutic regimen for weight loss  Pharmacist Clinical  Goal(s):  Patient will achieve adherence to monitoring guidelines and medication adherence to achieve therapeutic efficacy adhere to plan to optimize therapeutic regimen for weight loss as evidenced by report of adherence to recommended medication management changes through collaboration with PharmD and provider.   Interventions: 1:1 collaboration with Binnie Rail, MD regarding development and update of comprehensive plan of care as evidenced by provider attestation and co-signature Inter-disciplinary care team collaboration (see longitudinal plan of care) Comprehensive medication review performed; medication list updated in electronic medical record  Hypertension (BP goal <140/90) -Controlled - BP is at goal in office and at home per patient report; pt endorses compliance; she reports occasional dry cough with lisinopril but states it is not a problem for her -Current treatment: Lisinopril 40 mg daily HCTZ 12.5 mg daily -Medications previously tried: verapamil  -Denies hypotensive/hypertensive symptoms -Educated on BP goals and benefits of medications for prevention of heart attack, stroke and kidney damage; -Counseled to monitor BP at home periodically, document, and provide log at future appointments -Recommended to continue current medication  Prediabetes (A1c goal <6.5%) -Controlled - medication is for weight loss, PCOS and prediabetes; pt endorses compliance; she expresses frustration with inability to lose weight despite healthy diet and consistent exercise -Current medications: Metformin 500 mg BID Victoza 1.8 mg daily -Current exercise: swimming 30 min twice a day -Educated on A1c and blood sugar goals; -Counseled on benefits of Ozempic - improved wt loss potential with higher doses compared to max dose Victoza -Assessed pt finances - she reports Victoza is $11 per month and affordable; if Victoza is covered, Ozempic should be as well -Plan:  -Switch Victoza to Ozempic 0.5  mg weekly, plan to titrate to 1 mg after 4 weeks -Switch metformin to extended-release form  Depression/Anxiety (Goal: manage symptoms) -Controlled - pt reports only citalopram is for hx of panic attacks; bupropion and topiramate were added for wt loss and have not shown any benefit; pt wants to come off of unnecessary medications -Hx panic attacks -Current treatment: Citalopram 20 mg daily Bupropion SR 150 mg daily (wt mgmt) Topiramate 50 mg daily (emotional eating) -PHQ9: 1 (04/2020) -GAD7: not on file -Connected with PCP for mental health support -Discussed benefits of bupropion and topiramate for wt loss; pt has been on these for 3+ years and weight has stayed stagnant; she does not appear to have gotten any benefit from these despite lifestyle changes; bupropion is also not preferred in patients with a history of panic disorder; topiramate can cause cognitive issues; given lack of benefit so far, risks of continued therapy outweigh potential benefit and medications can be discontinued -Plan: stop topiramate and bupropion (taper bupropion over 1 week - reduce to every other day x 1 week then stop)  Asthma (Goal: manage symptoms; prevent exacerbations) -Controlled - pt reports she is still recovering from recent respiratory infection; she has been using albuterol more frequently than usual; she also feels montelukast is wearing off/not working as well -Current treatment  Montelukast 10 mg daily Albuterol HFA prn -Exacerbations requiring treatment in last 6 months: none -Patient denies consistent use of maintenance inhaler -Frequency of rescue inhaler use: 1-2 x daily (recovering from infection) -Counseled on Proper inhaler technique; -Plan: start levocetirizine 5 mg at bedtime   GERD (Goal: manage  symptoms) -Controlled - pt was told to stop omeprazole today per PCP; she will continue famotidine PRN -Current treatment  Famotidine 20 mg HS -Medications previously tried: omeprazole   -Recommended to continue current medication  Pain (Goal: manage symptoms) -Controlled - pt will be switching rheumatologists to [facility] next month; she reports pain is under control currently -Inflammatory polyarthritis; generalized osteoarthritis; cervical disc disorder; fibromyalgia -Current treatment  Celecoxib 200 mg BID Hydroxychloroquine 200 mg BID -Recommended to continue current medication  Health Maintenance -Vaccine gaps: covid booster, shingrix dose 2 -Current therapy:  Vitamin D 50,000 IU weekly Tylenol PM -Patient is satisfied with current therapy and denies issues -Recommended to continue current medication  Patient Goals/Self-Care Activities Patient will:  - take medications as prescribed focus on medication adherence by routine target a minimum of 150 minutes of moderate intensity exercise weekly -Stop Topiramate -Reduce bupropion to every other day for 1 week, then stop -Pick up Metformin XR (new formulation) from pharmacy -Stop Victoza. Start Ozempic 0.5 mg once a week. -Try levocetirizine 5 mg at bedtime for allergies       Medication Assistance: None required.  Patient affirms current coverage meets needs.  Compliance/Adherence/Medication fill history: Care Gaps: COVID booster (due 06/10/20) Shingrix (due 10/18/20)  Star-Rating Drugs: Lisinopril - LF 09/15/20 x 90 ds Metformin - LF 10/13/20 x 30 ds  Patient's preferred pharmacy is:  [id number] [place] [address] [place]Phone: [phone]: [phone] Uses pill box? No - prefers bottles Pt endorses 100% compliance  We discussed: Current pharmacy is preferred with insurance plan and patient is satisfied with pharmacy services Patient decided to: Continue current medication management strategy  Care Plan and Follow Up Patient Decision:  Patient agrees to Care Plan and Follow-up.  Plan: Telephone follow  up appointment with care management team member scheduled for:  1 month  LCharlene Brooke PharmD, BMound CPP Clinical Pharmacist [facility] [phone]

## 2020-10-26 NOTE — Assessment & Plan Note (Signed)
Chronic Generalized anxiety with occasional panic attacks Controlled Continue Celexa 20 mg daily

## 2020-10-26 NOTE — Assessment & Plan Note (Signed)
Chronic History of some depression with emotional eating Denies any depression here today and PHQ-9 score is 2, which confirms no depression Doing well on citalopram for anxiety-continue citalopram 20 mg daily continue bupropion 150 mg daily

## 2020-11-02 ENCOUNTER — Other Ambulatory Visit: Payer: Self-pay

## 2020-11-02 ENCOUNTER — Ambulatory Visit (INDEPENDENT_AMBULATORY_CARE_PROVIDER_SITE_OTHER): Payer: PPO | Admitting: Family Medicine

## 2020-11-02 ENCOUNTER — Encounter (INDEPENDENT_AMBULATORY_CARE_PROVIDER_SITE_OTHER): Payer: Self-pay | Admitting: Family Medicine

## 2020-11-02 VITALS — BP 109/70 | HR 78 | Temp 99.4°F | Ht 68.0 in | Wt 234.0 lb

## 2020-11-02 DIAGNOSIS — E66812 Obesity, class 2: Secondary | ICD-10-CM

## 2020-11-02 DIAGNOSIS — Z6835 Body mass index (BMI) 35.0-35.9, adult: Secondary | ICD-10-CM

## 2020-11-02 DIAGNOSIS — F3289 Other specified depressive episodes: Secondary | ICD-10-CM | POA: Diagnosis not present

## 2020-11-02 DIAGNOSIS — R7303 Prediabetes: Secondary | ICD-10-CM | POA: Diagnosis not present

## 2020-11-03 ENCOUNTER — Telehealth: Payer: Self-pay | Admitting: Pharmacist

## 2020-11-03 NOTE — Progress Notes (Signed)
    Chronic Care Management Pharmacy Assistant   Name: [name]  MRN: [id number] DOB: 08/25/1970  Pharmacist Review Made a call to [facility] to see if Ozempic went through with insurance. Pharmacist stated that it went through and patient picked up medication on 10/27/20 from pharmacy.  [place] Pharmacist Assistant [phone]   Time spent:8

## 2020-11-04 DIAGNOSIS — Z111 Encounter for screening for respiratory tuberculosis: Secondary | ICD-10-CM | POA: Diagnosis not present

## 2020-11-04 DIAGNOSIS — L409 Psoriasis, unspecified: Secondary | ICD-10-CM | POA: Insufficient documentation

## 2020-11-04 DIAGNOSIS — R768 Other specified abnormal immunological findings in serum: Secondary | ICD-10-CM | POA: Diagnosis not present

## 2020-11-04 DIAGNOSIS — M79641 Pain in right hand: Secondary | ICD-10-CM | POA: Diagnosis not present

## 2020-11-04 DIAGNOSIS — M79642 Pain in left hand: Secondary | ICD-10-CM | POA: Diagnosis not present

## 2020-11-04 DIAGNOSIS — M159 Polyosteoarthritis, unspecified: Secondary | ICD-10-CM | POA: Diagnosis not present

## 2020-11-04 DIAGNOSIS — Z79899 Other long term (current) drug therapy: Secondary | ICD-10-CM | POA: Diagnosis not present

## 2020-11-04 DIAGNOSIS — M7989 Other specified soft tissue disorders: Secondary | ICD-10-CM | POA: Diagnosis not present

## 2020-11-04 DIAGNOSIS — L4050 Arthropathic psoriasis, unspecified: Secondary | ICD-10-CM | POA: Diagnosis not present

## 2020-11-04 NOTE — Progress Notes (Signed)
Chief Complaint:   OBESITY [name] is here to discuss her progress with her obesity treatment plan along with follow-up of her obesity related diagnoses. [name] is on keeping a food journal and adhering to recommended goals of 1200 calories and 80+ grams of protein daily and states she is following her eating plan approximately (unknown)% of the time. [name] states she is swimming for 60 minutes 5 times per week.  Today's visit was #: 68 Starting weight: 241 lbs Starting date: 09/28/2016 Today's weight: 234 lbs Today's date: 11/02/2020 Total lbs lost to date: 7 Total lbs lost since last in-office visit: 1  Interim History: [name] continues to work on weight loss. She did some celebrations eating but she is getting back on track. She notes her hunger has decreased with starting Ozempic, but she has significant GI upset.  Subjective:   1. Pre-diabetes [name] was placed on Ozempic by Dr. Quay Burow and she feels it is helping decrease her hunger better than Victoza. She has significant GI upset with abdominal cramping, nausea, and diarrhea.   2. Other depression [name]'s Topamax and Wellbutrin was discontinued by Dr. Quay Burow. She is still on Celexa. She has decreased emotional eating while on Ozempic.  Assessment/Plan:   1. Pre-diabetes [name] is to watch her simple carbohydrates and avoid going too long without eating which can increase nausea and decrease her RMR.  2. Other depression Behavior modification techniques were discussed today to help [name] deal with her emotional/non-hunger eating behaviors. [name] is to continue being mindful of emotional eating behaviors, and will continue to monitor. Orders and follow up as documented in patient record.   3. Obesity with current BMI 35.6 [name] is currently in the action stage of change. As such, her goal is to continue with weight loss efforts. She has agreed to change to following a lower carbohydrate, vegetable and  lean protein rich diet plan to help decrease GI upset and diarrhea.   Exercise goals: As is.  Behavioral modification strategies: increasing water intake.  [name] has agreed to follow-up with our clinic in 3 weeks. She was informed of the importance of frequent follow-up visits to maximize her success with intensive lifestyle modifications for her multiple health conditions.   Objective:   Blood pressure 109/70, pulse 78, temperature 99.4 F (37.4 C), height '5\' 8"'$  (1.727 m), weight 234 lb (106.1 kg), SpO2 98 %. Body mass index is 35.58 kg/m.  General: Cooperative, alert, well developed, in no acute distress. HEENT: Conjunctivae and lids unremarkable. Cardiovascular: Regular rhythm.  Lungs: Normal work of breathing. Neurologic: No focal deficits.   Lab Results  Component Value Date   CREATININE 0.80 08/13/2020   BUN 25 (H) 08/13/2020   NA 140 08/13/2020   K 4.1 08/13/2020   CL 105 08/13/2020   CO2 22 08/13/2020   Lab Results  Component Value Date   ALT 30 08/13/2020   AST 24 08/13/2020   ALKPHOS 94 08/13/2020   BILITOT 0.3 08/13/2020   Lab Results  Component Value Date   HGBA1C 5.8 (H) 08/13/2020   HGBA1C 5.9 04/28/2020   HGBA1C 5.5 11/11/2019   HGBA1C 5.3 07/08/2019   HGBA1C 5.4 02/21/2019   Lab Results  Component Value Date   INSULIN 14.2 08/13/2020   INSULIN 25.1 (H) 04/30/2020   INSULIN 16.4 11/11/2019   INSULIN 17.3 02/21/2019   INSULIN 14.1 10/29/2018   Lab Results  Component Value Date   TSH 2.040 08/13/2020   Lab Results  Component Value Date  CHOL 155 08/13/2020   HDL 53 08/13/2020   LDLCALC 82 08/13/2020   TRIG 112 08/13/2020   CHOLHDL 3 08/16/2016   Lab Results  Component Value Date   VD25OH 34.8 08/13/2020   VD25OH 36.7 04/30/2020   VD25OH 37.1 11/11/2019   Lab Results  Component Value Date   WBC 6.7 08/13/2020   HGB 13.0 08/13/2020   HCT 38.8 08/13/2020   MCV 84 08/13/2020   PLT 266 08/13/2020   No results found for: IRON,  TIBC, FERRITIN  Obesity Behavioral Intervention:   Approximately 15 minutes were spent on the discussion below.  ASK: We discussed the diagnosis of obesity with [name] today and [name] agreed to give [place] permission to discuss obesity behavioral modification therapy today.  ASSESS: [name] has the diagnosis of obesity and her BMI today is 35.59. [name] is in the action stage of change.   ADVISE: [name] was educated on the multiple health risks of obesity as well as the benefit of weight loss to improve her health. She was advised of the need for long term treatment and the importance of lifestyle modifications to improve her current health and to decrease her risk of future health problems.  AGREE: Multiple dietary modification options and treatment options were discussed and [name] agreed to follow the recommendations documented in the above note.  ARRANGE: [name] was educated on the importance of frequent visits to treat obesity as outlined per CMS and USPSTF guidelines and agreed to schedule her next follow up appointment today.  Attestation Statements:   Reviewed by clinician on day of visit: allergies, medications, problem list, medical history, surgical history, family history, social history, and previous encounter notes.   I, Trixie Dredge, am acting as transcriptionist for Dennard Nip, MD.  I have reviewed the above documentation for accuracy and completeness, and I agree with the above. -   Dennard Nip, MD

## 2020-11-05 ENCOUNTER — Other Ambulatory Visit: Payer: Self-pay | Admitting: Internal Medicine

## 2020-11-12 ENCOUNTER — Telehealth: Payer: Self-pay | Admitting: Internal Medicine

## 2020-11-12 NOTE — Telephone Encounter (Signed)
Appointment made

## 2020-11-12 NOTE — Progress Notes (Signed)
Subjective:    Patient ID: [name], female    DOB: 08/14/1970, 50 y.o.   MRN: [id number]  HPI The patient is here for an acute visit.   Groin pain - three days ago she started having pain in her groin.  That day she was carrying something light back and forth to her car, then did some cleaning.  Later she first noticed numbness in her big toe, then mid leg pain and numbness/tingling near her knee on the medial aspect, which she figured was from her chronic back issues.  Then the pain moved up to the groin area.  She is most concerned about the pain in the groin area.  The other symptoms she has had in the past because of her chronic back issues.  The groin pain is worse with going from a sitting to a standing position or laying down.  When she bends over to touch her shoes it is also worse.  It is better when she lays down flat when she is walking.  Is worse with coughing or sneezing.  She denies any bulge in the area.  It is better when she holds the area.  She has had a hysterectomy and appendectomy.    Medications and allergies reviewed with patient and updated if appropriate.  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   Generalized osteoarthritis 08/25/2020   Insulin resistance 12/30/2019   Chronic back pain 12/24/2019   Dry cough 11/21/2017   Anxiety 08/17/2017   Hyperhidrosis 08/17/2017   Depression 40/98/1191   Umbilical hernia 47/82/9562   Diverticulosis of colon 02/26/2017   Other hyperlipidemia 02/06/2017   Asthma 11/24/2016   Vitamin D deficiency 11/09/2016   Class 1 obesity with serious comorbidity and body mass index (BMI) of 33.0 to 33.9 in adult 11/09/2016   Right shoulder pain 10/11/2016   AC (acromioclavicular) joint arthritis 09/13/2016   Postsurgical menopause 08/16/2016   Mucoid cyst of joint 11/04/2015   External hemorrhoid 08/20/2015   Prediabetes 06/05/2015   GERD (gastroesophageal  reflux disease) 06/04/2015   Status post bilateral breast reduction 04/01/2015   Cervical disc disorder with radiculopathy of cervical region 05/23/2014   Ulnar neuropathy 01/16/2014   Fibromyalgia 01/16/2014   Recurrent nephrolithiasis 04/29/2013   Arthralgia 04/29/2013   Panic attacks 01/15/2013   IBS (irritable bowel syndrome) 12/14/2011   B12 deficiency 06/30/2009   Essential hypertension 03/02/2007    Current Outpatient Medications on File Prior to Visit  Medication Sig Dispense Refill   albuterol (VENTOLIN HFA) 108 (90 Base) MCG/ACT inhaler TAKE 2 PUFFS BY MOUTH EVERY 6 HOURS AS NEEDED FOR WHEEZE OR SHORTNESS OF BREATH (Patient taking differently: Inhale 2 puffs into the lungs every 6 (six) hours as needed for shortness of breath or wheezing.) 8.5 g 5   Blood Glucose Monitoring Suppl (ONE TOUCH ULTRA 2) w/Device KIT OneTouch Ultra2 Meter     celecoxib (CELEBREX) 200 MG capsule Take 200 mg by mouth 2 (two) times daily.     citalopram (CELEXA) 20 MG tablet TAKE 1 TABLET BY MOUTH EVERY DAY 90 tablet 1   diphenhydramine-acetaminophen (TYLENOL PM) 25-500 MG TABS tablet Take 2 tablets by mouth at bedtime.     famotidine (PEPCID) 20 MG tablet TAKE 1 TABLET BY MOUTH EVERYDAY AT BEDTIME 90 tablet 1   glucose blood (ONETOUCH ULTRA) test strip TEST BLOOD SUGARS ONCE A DAY 100 strip 0   hydrochlorothiazide (MICROZIDE) 12.5 MG capsule TAKE  1 CAPSULE BY MOUTH EVERY DAY 90 capsule 1   hydroxychloroquine (PLAQUENIL) 200 MG tablet Take 200 mg by mouth 2 (two) times daily.     Insulin Pen Needle (BD PEN NEEDLE NANO U/F) 32G X 4 MM MISC 1 each by Other route daily. 100 each 0   levocetirizine (XYZAL) 5 MG tablet Take 1 tablet (5 mg total) by mouth every evening. 90 tablet 1   lisinopril (ZESTRIL) 40 MG tablet TAKE 1 TABLET BY MOUTH EVERY DAY 90 tablet 1   metFORMIN (GLUCOPHAGE XR) 500 MG 24 hr tablet Take 1 tablet (500 mg total) by mouth 2 (two) times daily before a meal. 60 tablet 0   OneTouch Delica  Lancets 31V MISC 1 each by Does not apply route 2 (two) times daily. 100 each 0   Semaglutide,0.25 or 0.5MG/DOS, (OZEMPIC, 0.25 OR 0.5 MG/DOSE,) 2 MG/1.5ML SOPN Inject 0.5 mg into the skin once a week. 1.5 mL 0   No current facility-administered medications on file prior to visit.    Past Medical History:  Diagnosis Date   Anemia    Anxiety    Arthritis    inflammitory   Asthma    B12 deficiency    Back pain    Chronic joint pain    Constipation    DDD (degenerative disc disease), lumbar    low back and neck and shoulders   Depression    during divorce & legal matters   Fatty liver    Fibromyalgia    Gallbladder problem    GERD (gastroesophageal reflux disease)    History of kidney stones    Dr Phebe Colla   History of polycystic ovarian disease S/P BSO   Hx of anxiety disorder    Hypertension    a   Hypothyroidism    no meds   IBS (irritable bowel syndrome)    IBS (irritable bowel syndrome)    Infertility, female    Joint pain    Kidney problem    Lactose intolerance    Lupus (HCC) hx positive ANA   followed by Dr Gavin Pound; possible lupus   Multiple food allergies    Myalgia    Osteoarthritis    Polyarthritis, 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[place])    POLYCYSTIC OVARIAN DISEASE 03/02/2007   Qualifier: Diagnosis of  By: Linna Darner MD, Rae Mar BSO for cysts & endometriosis; Dr Reino Kent, Gyn Seeing Dr Paula Compton     PONV (postoperative nausea and vomiting)    Pre-diabetes    Prediabetes    Sweating profusely    Symptomatic mammary hypertrophy    URI (upper respiratory infection)    currently on cefdinir    Past Surgical History:  Procedure Laterality Date   ABDOMINAL HYSTERECTOMY     [place]   exploratory lap   BACK SURGERY     BREAST REDUCTION SURGERY Bilateral 03/26/2015   Procedure: BILATERAL BREAST REDUCTION ;  Surgeon: Wallace Going, DO;  Location: [facility];  Service: Plastics;  Laterality: Bilateral;    CARPAL TUNNEL RELEASE     bilateral; Dr Sherwood Gambler   COLONOSCOPY     in [place]  07-09-2004   HX BILATERAL RENAL STONES/ RIGHT FLANK PAIN   CYSTOSCOPY W/ URETERAL STENT PLACEMENT  11/12/2011   Procedure: CYSTOSCOPY WITH RETROGRADE PYELOGRAM/URETERAL STENT PLACEMENT;  Surgeon: Alexis Frock, MD;  Location: [facility];  Service: Urology;  Laterality: Right;  CYSTOSCOPY W/ URETERAL STENT REMOVAL  12/07/2011   Procedure: CYSTOSCOPY WITH STENT REMOVAL;  Surgeon: Alexis Frock, MD;  Location[facility];  Service: Urology;  Laterality: Right;   CYSTOSCOPY/RETROGRADE/URETEROSCOPY/STONE EXTRACTION WITH BASKET  12/07/2011   Procedure: CYSTOSCOPY/RETROGRADE/URETEROSCOPY/STONE EXTRACTION WITH BASKET;  Surgeon: Alexis Frock, MD;  Location: [facility];  Service: Urology;  Laterality: Right;   DILATION AND CURETTAGE OF UTERUS     X5   EXCISION LEFT BARTHOLIN GLAND  02-05-2002   EYE SURGERY     Retinal tears surgery bilateral   LAPAROSCOPIC ASSISTED VAGINAL HYSTERECTOMY  2000   LAPAROSCOPIC CHOLECYSTECTOMY  05-31-2007   LAPAROSCOPIC LASER ABLATION ENDOMETRIOSIS AND LEFT SALPINGO-OOPHORECTOMY  11-03-1999   LAPAROSCOPY WITH RIGHT SALPINGO-OOPHECTOMY/ LYSIS ADHESIONS AND ABLATION ENDOMETRIOSIS  05-17-2001   LIPOSUCTION Bilateral 03/26/2015   Procedure: WITH LIPOSUCTION;  Surgeon: Wallace Going, DO;  Location: [place];  Service: Plastics;  Laterality: Bilateral;   LUMBAR LAMINECTOMY  2003   L4 - L5   NECK SURGERY  2017   PLANTAR FASCIA SURGERY     right   PLANTAR FASCIA SURGERY  02/2011   left   REDUCTION MAMMAPLASTY     RIGHT URETEROSCOPIC STONE EXTRACTION  08-04-2000   SHOULDER ARTHROSCOPY WITH ROTATOR CUFF REPAIR Right 05/11/2020   Procedure: SHOULDER ARTHROSCOPY WITH ROTATOR CUFF REPAIR;  Surgeon: Lovell Sheehan, MD;  Location: [facility];  Service: Orthopedics;  Laterality: Right;   TONSILLECTOMY      URETERAL STENT PLACEMENT  11/12/11   Dr Tresa Moore    Social History   Socioeconomic History   Marital status: Married    Spouse name: Darnelle Maffucci   Number of children: 0   Years of education: college   Highest education level: Not on file  Occupational History   Occupation: disabled  Tobacco Use   Smoking status: Never   Smokeless tobacco: Never  Substance and Sexual Activity   Alcohol use: Yes    Alcohol/week: 1.0 standard drink    Types: 1 Shots of liquor per week    Comment:  very rarely   Drug use: No   Sexual activity: Yes    Birth control/protection: Surgical  Other Topics Concern   Not on file  Social History Narrative   Patient Lives at home with her husband Darnelle Maffucci)   Disabled.   Education two years of college.   Right handed.   Caffeine coffee and sweet tea. Not daily.   Social Determinants of Health   Financial Resource Strain: Low Risk    Difficulty of Paying Living Expenses: Not hard at all  Food Insecurity: No Food Insecurity   Worried About Charity fundraiser in the Last Year: Never true   Romeo in the Last Year: Never true  Transportation Needs: No Transportation Needs   Lack of Transportation (Medical): No   Lack of Transportation (Non-Medical): No  Physical Activity: Sufficiently Active   Days of Exercise per Week: 7 days   Minutes of Exercise per Session: 30 min  Stress: No Stress Concern Present   Feeling of Stress : Not at all  Social Connections: Unknown   Frequency of Communication with Friends and Family: More than three times a week   Frequency of Social Gatherings with Friends and Family: More than three times a week   Attends Religious Services: Patient refused   Active Member of Clubs or Organizations: Patient refused   Attends Archivist Meetings: Patient refused   Marital Status:  Married    Family History  Problem Relation Age of Onset   Hypertension Mother    Colon polyps Mother    Atrial fibrillation Mother         Dr Angelena Form   Hyperlipidemia Mother    Heart disease Mother    Anxiety disorder Mother    Sleep apnea Mother    Obesity Mother    Heart attack Father 59   Diabetes Father    Hypertension Father    Hyperlipidemia Father    Heart disease Father    Stroke Father    Obesity Father    Hypertension Sister    Heart attack Paternal Grandmother 93   Breast cancer Paternal Aunt 31   Colon cancer Maternal Uncle    Colon cancer Paternal Uncle    Colon polyps Maternal Grandmother    Stroke Maternal Grandmother 76   Diabetes Paternal Grandfather    Stroke Maternal Grandfather 64    Review of Systems     Objective:   Vitals:   11/13/20 1543  BP: 110/80  Pulse: 84  Temp: 98.4 F (36.9 C)  SpO2: 99%   BP Readings from Last 3 Encounters:  11/13/20 110/80  11/02/20 109/70  10/26/20 120/88   Wt Readings from Last 3 Encounters:  11/13/20 238 lb (108 kg)  11/02/20 234 lb (106.1 kg)  10/26/20 242 lb (109.8 kg)   Body mass index is 36.19 kg/m.   Physical Exam Constitutional:      General: She is not in acute distress.    Appearance: Normal appearance. She is not ill-appearing.  HENT:     Head: Normocephalic and atraumatic.  Abdominal:     General: There is no distension.     Palpations: Abdomen is soft.     Tenderness: There is no abdominal tenderness. There is no guarding or rebound.  Musculoskeletal:     Comments: Tenderness with palpation right suprapubic region, no obvious bulge.  The pain is worse with sitting, laying down and sitting up.  Pain improved when she holds the area.  No pain along groin.     Skin:    General: Skin is warm and dry.     Findings: No erythema.  Neurological:     Mental Status: She is alert.     Sensory: Sensory deficit (Right medial mid leg-chronic) present.           Assessment & Plan:    See Problem List for Assessment and Plan of chronic medical problems.    This visit occurred during the SARS-CoV-2 public health emergency.   Safety protocols were in place, including screening questions prior to the visit, additional usage of staff PPE, and extensive cleaning of exam room while observing appropriate contact time as indicated for disinfecting solutions.

## 2020-11-12 NOTE — Telephone Encounter (Signed)
   Patient calling to report groin pain x3 days No other symptoms   Patient seeking advice only

## 2020-11-13 ENCOUNTER — Ambulatory Visit (INDEPENDENT_AMBULATORY_CARE_PROVIDER_SITE_OTHER): Payer: PPO | Admitting: Internal Medicine

## 2020-11-13 ENCOUNTER — Other Ambulatory Visit: Payer: Self-pay

## 2020-11-13 ENCOUNTER — Encounter: Payer: Self-pay | Admitting: Internal Medicine

## 2020-11-13 VITALS — BP 110/80 | HR 84 | Temp 98.4°F | Ht 68.0 in | Wt 238.0 lb

## 2020-11-13 DIAGNOSIS — K4090 Unilateral inguinal hernia, without obstruction or gangrene, not specified as recurrent: Secondary | ICD-10-CM | POA: Diagnosis not present

## 2020-11-13 NOTE — Assessment & Plan Note (Signed)
Acute Symptoms consistent with inguinal hernia Will refer to surgery-she is fairly symptomatic and most likely will need surgery Discussed the concern with incarcerated hernia and that if she starts to experience pain and it does not improve she needs to go to the emergency room She does have pain medication at home and I do advise that she take that as needed-she states she does not need an additional prescription now

## 2020-11-13 NOTE — Patient Instructions (Addendum)
Medications changes include :   take the pain medication you have at home as needed.      A referral was ordered for general surgery.       Someone from their office will call you to schedule an appointment.      Inguinal Hernia, Adult An inguinal hernia develops when fat or the intestines push through a weak spot in a muscle where the leg meets the lower abdomen (groin). This creates a bulge. This kind of hernia could also be: In the scrotum, if you are female. In folds of skin around the vagina, if you are female. There are three types of inguinal hernias: Hernias that can be pushed back into the abdomen (are reducible). This type rarely causes pain. Hernias that are not reducible (are incarcerated). Hernias that are not reducible and lose their blood supply (are strangulated). This type of hernia requires emergency surgery. What are the causes? This condition is caused by having a weak spot in the muscles or tissues in your groin. This develops over time. The hernia may poke through the weak spot when you suddenly strain your lower abdominal muscles, such as when you: Lift a heavy object. Strain to have a bowel movement. Constipation can lead to straining. Cough. What increases the risk? This condition is more likely to develop in: Males. Pregnant females. People who: Are overweight. Work in jobs that require long periods of standing or heavy lifting. Have had an inguinal hernia before. Smoke or have lung disease. These factors can lead to long-term (chronic) coughing. What are the signs or symptoms? Symptoms may depend on the size of the hernia. Often, a small inguinal hernia has no symptoms. Symptoms of a larger hernia may include: A bulge in the groin area. This is easier to see when standing. It might not be visible when lying down. Pain or burning in the groin. This may get worse when lifting, straining, or coughing. A dull ache or a feeling of pressure in the  groin. An unusual bulge in the scrotum, in males. Symptoms of a strangulated inguinal hernia may include: A bulge in your groin that is very painful and tender to the touch. A bulge that turns red or purple. Fever, nausea, and vomiting. Inability to have a bowel movement or to pass gas. How is this diagnosed? This condition is diagnosed based on your symptoms, your medical history, and a physical exam. Your health care provider may feel your groin area and ask youto cough. How is this treated? Treatment depends on the size of your hernia and whether you have symptoms. If you do not have symptoms, your health care provider may have you watch your hernia carefully and have you come in for follow-up visits. If your hernia islarge or if you have symptoms, you may need surgery to repair the hernia. Follow these instructions at home: Lifestyle Avoid lifting heavy objects. Avoid standing for long periods of time. Do not use any products that contain nicotine or tobacco. These products include cigarettes, chewing tobacco, and vaping devices, such as e-cigarettes. If you need help quitting, ask your health care provider. Maintain a healthy weight. Preventing constipation You may need to take these actions to prevent or treat constipation: Drink enough fluid to keep your urine pale yellow. Take over-the-counter or prescription medicines. Eat foods that are high in fiber, such as beans, whole grains, and fresh fruits and vegetables. Limit foods that are high in fat and processed sugars, such as  fried or sweet foods. General instructions You may try to push the hernia back in place by very gently pressing on it while lying down. Do not try to force the bulge back in if it will not push in easily. Watch your hernia for any changes in shape, size, or color. Get help right away if you notice any changes. Take over-the-counter and prescription medicines only as told by your health care provider. Keep all  follow-up visits. This is important. Contact a health care provider if: You have a fever or chills. You develop new symptoms. Your symptoms get worse. Get help right away if: You have pain in your groin that suddenly gets worse. You have a bulge in your groin that: Suddenly gets bigger and does not get smaller. Becomes red or purple or painful to the touch. You are a man and you have a sudden pain in your scrotum, or the size of your scrotum suddenly changes. You cannot push the hernia back in place by very gently pressing on it when you are lying down. You have nausea or vomiting that does not go away. You have a fast heartbeat. You cannot have a bowel movement or pass gas. These symptoms may represent a serious problem that is an emergency. Do not wait to see if the symptoms will go away. Get medical help right away. Call your local emergency services (911 in the [place] Summary An inguinal hernia develops when fat or the intestines push through a weak spot in a muscle where your leg meets your lower abdomen (groin). This condition is caused by having a weak spot in muscles or tissues in your groin. Symptoms may depend on the size of the hernia, and they may include pain or swelling in your groin. A small inguinal hernia often has no symptoms. Treatment may not be needed if you do not have symptoms. If you have symptoms or a large hernia, you may need surgery to repair the hernia. Avoid lifting heavy objects. Also, avoid standing for long periods of time. This information is not intended to replace advice given to you by your health care provider. Make sure you discuss any questions you have with your healthcare provider. Document Revised: 11/19/2019 Document Reviewed: 11/19/2019 Elsevier Patient Education  2022 [employer].

## 2020-11-17 ENCOUNTER — Encounter: Payer: Self-pay | Admitting: Internal Medicine

## 2020-11-17 ENCOUNTER — Telehealth: Payer: Self-pay | Admitting: Internal Medicine

## 2020-11-17 NOTE — Telephone Encounter (Signed)
Patient calling to check on the status of the referral for the hernia. Requesting a call back for an update.   Please advise.

## 2020-11-18 ENCOUNTER — Other Ambulatory Visit: Payer: Self-pay | Admitting: Internal Medicine

## 2020-11-18 DIAGNOSIS — R7303 Prediabetes: Secondary | ICD-10-CM

## 2020-11-18 DIAGNOSIS — E8881 Metabolic syndrome: Secondary | ICD-10-CM

## 2020-11-23 ENCOUNTER — Ambulatory Visit (INDEPENDENT_AMBULATORY_CARE_PROVIDER_SITE_OTHER): Payer: PPO | Admitting: Pharmacist

## 2020-11-23 ENCOUNTER — Other Ambulatory Visit: Payer: Self-pay

## 2020-11-23 DIAGNOSIS — J4540 Moderate persistent asthma, uncomplicated: Secondary | ICD-10-CM | POA: Diagnosis not present

## 2020-11-23 DIAGNOSIS — I10 Essential (primary) hypertension: Secondary | ICD-10-CM | POA: Diagnosis not present

## 2020-11-23 DIAGNOSIS — E8881 Metabolic syndrome: Secondary | ICD-10-CM

## 2020-11-23 DIAGNOSIS — R7303 Prediabetes: Secondary | ICD-10-CM

## 2020-11-23 DIAGNOSIS — Z6833 Body mass index (BMI) 33.0-33.9, adult: Secondary | ICD-10-CM

## 2020-11-23 DIAGNOSIS — E669 Obesity, unspecified: Secondary | ICD-10-CM

## 2020-11-23 DIAGNOSIS — F419 Anxiety disorder, unspecified: Secondary | ICD-10-CM

## 2020-11-23 DIAGNOSIS — G4709 Other insomnia: Secondary | ICD-10-CM

## 2020-11-23 MED ORDER — OZEMPIC (1 MG/DOSE) 4 MG/3ML ~~LOC~~ SOPN: 1.0000 mg | PEN_INJECTOR | SUBCUTANEOUS | 0 refills | Status: DC

## 2020-11-23 NOTE — Progress Notes (Signed)
Chronic Care Management Pharmacy Note  11/23/2020 Name:  [name] MRN:  [id number] DOB:  1970/11/15  Summary: -Pt reports GI side effects from Ozempic 0.5 mg have mostly resolved; she reports home weight 231 lbs, 3 lb wt loss since most recent weight mgmt appt (8/1) -Pt reports trouble sleeping (chronic issue, but worse last week)  Recommendations/Changes made from today's visit: -Increase Ozempic to 1 mg gradually (increase by clicks between doses) -Counseled on sleep hygiene - avoid screens within 1 hr of bedtime   Subjective: [name] is an 50 y.o. year old female who is a primary patient of Burns, Claudina Lick, MD.  The CCM team was consulted for assistance with disease management and care coordination needs.    Engaged with patient by telephone for follow up visit in response to provider referral for pharmacy case management and/or care coordination services.   Consent to Services:  The patient was given information about Chronic Care Management services, agreed to services, and gave verbal consent prior to initiation of services.  Please see initial visit note for detailed documentation.   Patient Care Team: Binnie Rail, MD as PCP - General (Internal Medicine) Gavin Pound, MD as Consulting Physician (Rheumatology) Charlton Haws, [facility] as Pharmacist (Pharmacist)  Recent office visits: 11/13/20 Dr Quay Burow OV: acute visit groin pain - dx hernia. Referred to surgery.  10/12/20 Dr Mitchel Honour VV: LRI - rx'd zpak, tessalon and promethazine-DM.  04/28/20 Dr Quay Burow OV: pre-op clearance for shoulder surgery. A1c in prediabetic range. No changes.  Recent consult visits: 11/04/20 Dr Posey Pronto [facility] Rheumatology): establish care for inflammatory polyarthritis. Start methotrexate 06.7 mg weekly w/ folic acid daily.  11/02/20 Dr Leafy Ro (wt mgmt): d/c bupropion, vitamin d, montelukast.  09/14/20 Dr Leafy Ro (wt mgmt): visit #59, wt loss 3 lbs.  08/10/20 Dr Trudie Reed (rheumatology):  f/u inflammatory polyarthropathy  Feb-April 2022 - PT for shoulder  Hospital visits: 05/11/20 Admission for Shoulder surgery   Objective:  Lab Results  Component Value Date   CREATININE 0.80 08/13/2020   BUN 25 (H) 08/13/2020   GFR 69.89 04/28/2020   GFRNONAA 109 11/11/2019   GFRAA 125 11/11/2019   NA 140 08/13/2020   K 4.1 08/13/2020   CALCIUM 9.2 08/13/2020   CO2 22 08/13/2020   GLUCOSE 75 08/13/2020    Lab Results  Component Value Date/Time   HGBA1C 5.8 (H) 08/13/2020 12:50 PM   HGBA1C 5.9 04/28/2020 02:44 PM   GFR 69.89 04/28/2020 02:44 PM   GFR 103.96 08/16/2016 10:11 AM    Last diabetic Eye exam: No results found for: HMDIABEYEEXA  Last diabetic Foot exam: No results found for: HMDIABFOOTEX   Lab Results  Component Value Date   CHOL 155 08/13/2020   HDL 53 08/13/2020   LDLCALC 82 08/13/2020   TRIG 112 08/13/2020   CHOLHDL 3 08/16/2016    Hepatic Function Latest Ref Rng & Units 08/13/2020 04/28/2020 11/11/2019  Total Protein 6.0 - 8.5 g/dL 6.7 7.5 6.3  Albumin 3.8 - 4.8 g/dL 4.0 4.4 3.9  AST 0 - 40 IU/L '24 19 20  ' ALT 0 - 32 IU/L '30 21 19  ' Alk Phosphatase 44 - 121 IU/L 94 84 68  Total Bilirubin 0.0 - 1.2 mg/dL 0.3 0.3 <0.2  Bilirubin, Direct 0.0 - 0.3 mg/dL - - -    Lab Results  Component Value Date/Time   TSH 2.040 08/13/2020 12:50 PM   TSH 1.800 11/11/2019 10:49 AM   FREET4 0.92 08/13/2020 12:50 PM  FREET4 0.83 11/11/2019 10:49 AM    CBC Latest Ref Rng & Units 08/13/2020 04/28/2020 11/11/2019  WBC 3.4 - 10.8 x10E3/uL 6.7 13.3(H) 5.8  Hemoglobin 11.1 - 15.9 g/dL 13.0 13.6 12.2  Hematocrit 34.0 - 46.6 % 38.8 42.2 37.2  Platelets 150 - 450 x10E3/uL 266 329.0 246    Lab Results  Component Value Date/Time   VD25OH 34.8 08/13/2020 12:50 PM   VD25OH 36.7 04/30/2020 08:13 AM    Clinical ASCVD: No  The 10-year ASCVD risk score Mikey Bussing DC Jr., et al., 2013) is: 1%   Values used to calculate the score:     Age: 4 years     Sex: Female     Is  Non-Hispanic African American: No     Diabetic: No     Tobacco smoker: No     Systolic Blood Pressure: 741 mmHg     Is BP treated: Yes     HDL Cholesterol: 53 mg/dL     Total Cholesterol: 155 mg/dL    Depression screen [facility] 2/9 10/26/2020 10/26/2020 04/22/2020  Decreased Interest 0 0 0  Down, Depressed, Hopeless 0 0 0  PHQ - 2 Score 0 0 0  Altered sleeping 1 - 0  Tired, decreased energy 1 - 1  Change in appetite 0 - 0  Feeling bad or failure about yourself  0 - 0  Trouble concentrating 0 - 0  Moving slowly or fidgety/restless 0 - 0  Suicidal thoughts 0 - 0  PHQ-9 Score 2 - 1  Difficult doing work/chores Not difficult at all - Not difficult at all  Some recent data might be hidden     Social History   Tobacco Use  Smoking Status Never  Smokeless Tobacco Never   BP Readings from Last 3 Encounters:  11/13/20 110/80  11/02/20 109/70  10/26/20 120/88   Pulse Readings from Last 3 Encounters:  11/13/20 84  11/02/20 78  10/26/20 91   Wt Readings from Last 3 Encounters:  11/13/20 238 lb (108 kg)  11/02/20 234 lb (106.1 kg)  10/26/20 242 lb (109.8 kg)   BMI Readings from Last 3 Encounters:  11/13/20 36.19 kg/m  11/02/20 35.58 kg/m  10/26/20 36.80 kg/m    Assessment/Interventions: Review of patient past medical history, allergies, medications, health status, including review of consultants reports, laboratory and other test data, was performed as part of comprehensive evaluation and provision of chronic care management services.   SDOH:  (Social Determinants of Health) assessments and interventions performed: Yes  SDOH Screenings   Alcohol Screen: Low Risk    Last Alcohol Screening Score (AUDIT): 2  Depression (PHQ2-9): Low Risk    PHQ-2 Score: 2  Financial Resource Strain: Low Risk    Difficulty of Paying Living Expenses: Not hard at all  Food Insecurity: No Food Insecurity   Worried About Charity fundraiser in the Last Year: Never true   Ran Out of Food in the  Last Year: Never true  Housing: Low Risk    Last Housing Risk Score: 0  Physical Activity: Sufficiently Active   Days of Exercise per Week: 7 days   Minutes of Exercise per Session: 30 min  Social Connections: Unknown   Frequency of Communication with Friends and Family: More than three times a week   Frequency of Social Gatherings with Friends and Family: More than three times a week   Attends Religious Services: Patient refused   Active Member of Clubs or Organizations: Patient refused   Attends  Archivist Meetings: Patient refused   Marital Status: Married  Stress: No Stress Concern Present   Feeling of Stress : Not at all  Tobacco Use: Low Risk    Smoking Tobacco Use: Never   Smokeless Tobacco Use: Never  Transportation Needs: No Transportation Needs   Lack of Transportation (Medical): No   Lack of Transportation (Non-Medical): No    CCM Care Plan  Allergies  Allergen Reactions   Chlorhexidine Gluconate Rash, Itching and Swelling    Developed severe rash where chloraprep was used on chest area   Ivp Dye [Iodinated Diagnostic Agents] Rash and Other (See Comments)    Flushing, minor facial rash & dyspnea   Nalbuphine Shortness Of Breath and Rash    Nubain caused respiratory distress & rash   Septra [Sulfamethoxazole-Trimethoprim] Shortness Of Breath and Rash   Adhesive [Tape] Rash   Diclofenac Other (See Comments)    GI Upset   Augmentin [Amoxicillin-Pot Clavulanate] Nausea And Vomiting   Betadine [Povidone Iodine] Rash   Latex Rash    Medications Reviewed Today     Reviewed by Charlton Haws, [facility] (Pharmacist) on 11/23/20 at 1623  Med List Status: <None>   Medication Order Taking? Sig Documenting Provider Last Dose Status Informant  albuterol (VENTOLIN HFA) 108 (90 Base) MCG/ACT inhaler [id number] Yes TAKE 2 PUFFS BY MOUTH EVERY 6 HOURS AS NEEDED FOR WHEEZE OR SHORTNESS OF BREATH  Patient taking differently: Inhale 2 puffs into the lungs every 6 (six)  hours as needed for shortness of breath or wheezing.   Binnie Rail, MD Taking Active   Blood Glucose Monitoring Suppl (ONE TOUCH ULTRA 2) w/Device Drucie Opitz [id number] Yes OneTouch Ultra2 Meter [provider] Taking Active Self  celecoxib (CELEBREX) 200 MG capsule [id number] Yes Take 200 mg by mouth 2 (two) times daily. [provider] Taking Active Self  citalopram (CELEXA) 20 MG tablet [id number] Yes TAKE 1 TABLET BY MOUTH EVERY DAY Burns, Claudina Lick, MD Taking Active   diphenhydramine-acetaminophen (TYLENOL PM) 25-500 MG TABS tablet [id number] Yes Take 2 tablets by mouth at bedtime. [provider] Taking Active Self  famotidine (PEPCID) 20 MG tablet [id number] Yes TAKE 1 TABLET BY MOUTH EVERYDAY AT BEDTIME Burns, Claudina Lick, MD Taking Active   glucose blood [facility] ULTRA) test strip [id number] Yes TEST BLOOD SUGARS ONCE A DAY Leafy Ro, Caren D, MD Taking Active Self  hydrochlorothiazide (MICROZIDE) 12.5 MG capsule [id number] Yes TAKE 1 CAPSULE BY MOUTH EVERY DAY Burns, Claudina Lick, MD Taking Active   hydroxychloroquine (PLAQUENIL) 200 MG tablet [id number] Yes Take 200 mg by mouth 2 (two) times daily. [provider] Taking Active Self  Insulin Pen Needle (BD PEN NEEDLE NANO U/F) 32G X 4 MM MISC [id number] Yes 1 each by Other route daily. Dennard Nip D, MD Taking Active   levocetirizine (XYZAL) 5 MG tablet [id number] Yes Take 1 tablet (5 mg total) by mouth every evening. Binnie Rail, MD Taking Active   lisinopril (ZESTRIL) 40 MG tablet [id number] Yes TAKE 1 TABLET BY MOUTH EVERY DAY Burns, Claudina Lick, MD Taking Active   metFORMIN (GLUCOPHAGE-XR) 500 MG 24 hr tablet [id number] Yes TAKE 1 TABLET (500 MG TOTAL) BY MOUTH 2 (TWO) TIMES DAILY BEFORE A MEAL. Binnie Rail, MD Taking Active   OneTouch Delica Lancets 97C MISC [id number] Yes 1 each by Does not apply route 2 (two) times daily. Dennard Nip D, MD Taking Active   Semaglutide,0.25 or 0.5MG/DOS, (OZEMPIC, 0.25 OR 0.5 MG/DOSE,) 2  MG/1.5ML  SOPN [id number] Yes Inject 0.5 mg into the skin once a week. 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   Depression 09/62/8366   Umbilical hernia 29/47/6546   Diverticulosis of colon 02/26/2017   Other hyperlipidemia 02/06/2017   Asthma 11/24/2016   Vitamin D deficiency 11/09/2016   Class 1 obesity with serious comorbidity and body mass index (BMI) of 33.0 to 33.9 in adult 11/09/2016   Right shoulder pain 10/11/2016   AC (acromioclavicular) joint arthritis 09/13/2016   Postsurgical menopause 08/16/2016   Mucoid cyst of joint 11/04/2015   External hemorrhoid 08/20/2015   Prediabetes 06/05/2015   GERD (gastroesophageal reflux disease) 06/04/2015   Status post bilateral breast reduction 04/01/2015   Cervical disc disorder with radiculopathy of cervical region 05/23/2014   Ulnar neuropathy 01/16/2014   Fibromyalgia 01/16/2014   Recurrent nephrolithiasis 04/29/2013   Arthralgia 04/29/2013   Panic attacks 01/15/2013   IBS (irritable bowel syndrome) 12/14/2011   B12 deficiency 06/30/2009   Essential hypertension 03/02/2007    Immunization History  Administered Date(s) Administered   Influenza Split 02/17/2011   Influenza Whole 01/02/2010   Influenza,inj,Quad PF,6+ Mos 01/02/2013, 01/11/2017, 01/03/2018, 02/07/2019   Influenza-Unspecified 01/01/2014, 01/08/2016, 01/11/2017, 03/04/2020   PFIZER(Purple Top)SARS-COV-2 Vaccination 07/16/2019, 08/06/2019, 03/12/2020   Pneumococcal Polysaccharide-23 06/04/2008, 05/02/2013   Tdap 08/14/2012   Zoster Recombinat (Shingrix) 08/23/2020    Conditions to be  addressed/monitored:  Hypertension, Hyperlipidemia, Asthma, and Anxiety, Obesity, Prediabetes  Care Plan : CCM Pharmacy Care Plan  Updates made by Charlton Haws, [place] since 11/23/2020 12:00 AM     Problem: Hypertension, Hyperlipidemia, Asthma, and Anxiety, Obesity, Prediabetes   Priority: High     Long-Range Goal: Disease management   Start Date: 10/26/2020  Expected End Date: 10/26/2021  This Visit's Progress: On track  Recent Progress: On track  Priority: High  Note:   Current Barriers:  Unable to independently monitor therapeutic efficacy Suboptimal therapeutic regimen for weight loss  Pharmacist Clinical Goal(s):  Patient will achieve adherence to monitoring guidelines and medication adherence to achieve therapeutic efficacy adhere to plan to optimize therapeutic regimen for weight loss as evidenced by report of adherence to recommended medication management changes through collaboration with PharmD and provider.   Interventions: 1:1 collaboration with Binnie Rail, MD regarding development and update of comprehensive plan of care as evidenced by provider attestation and co-signature Inter-disciplinary care team collaboration (see longitudinal plan of care) Comprehensive medication review performed; medication list updated in electronic medical record  Hypertension (BP goal <140/90) -Controlled - BP is at goal in office and at home per patient report; pt endorses compliance; she reports occasional dry cough with lisinopril but states it is not a problem for her -Current treatment: Lisinopril 40 mg daily HCTZ 12.5 mg daily -Medications previously tried: verapamil  -Recommended to continue current medication  Prediabetes (A1c goal <6.5%) -Controlled - medication is for weight loss, PCOS and prediabetes; pt reports initial GI upset when she switched from Victoza to Ozempic, but side effects mostly resolved by week 3-4; she has last ~3 lbs since 8/1 -Current  medications: Metformin XR 500 mg BID Ozempic 0.5 mg  weekly (Wed) -Current exercise: swimming 30 min twice a day -Educated on A1c and blood sugar goals, prediabetes range -Assessed pt finances - she reports Ozempic was even cheaper than Victoza -Plan: Increase Ozempic to 1 mg gradually   Depression/Anxiety (Goal: manage symptoms) -Controlled - pt reports only citalopram is for hx of panic attacks; pt has successfully discontinued both bupropion and topiramate (ineffective for wt loss); -Current treatment: Citalopram 20 mg daily -Recommend to continue current medication  Asthma (Goal: manage symptoms; prevent exacerbations) -Controlled - pt reports levocetirizine is not working very well; it was added last month to replace montelukast which was also ineffective -Current treatment  Albuterol HFA prn Levocetirizine 5 mg daily -Recommend to continue current medication  Pain (Goal: manage symptoms) -Controlled - pt saw new rheumatologist this month and methotrexate was started; pt stopped after 3 doses due to side effects; she has f/u appt in September -Inflammatory polyarthritis; generalized osteoarthritis; cervical disc disorder; fibromyalgia -Current treatment  Celecoxib 200 mg BID Hydroxychloroquine 200 mg BID Methotrexate 12.5 mg weekly - pt stopped after 3 doses -Counseled on alternative immunotherapy options including biologics; advised pt to keep appt with rheumatologist to discuss options next month -Recommended to continue current medication  Insomnia (Goal: improve sleep) -Not ideally controlled - pt reports sleep troubles is a long term issue for her but worse in the past week; she was "doing nothing" during the days last week after hernia dx, which made it harder for her to sleep at night -typical bedtime 9pm, trouble falling asleep, waking up every few hours -Medications previously tried: melatonin, trazodone, Tylenol PM  -Counseled on sleep hygiene; importance of routine  sleep schedule; avoiding screens within 1 hr of bedtime  Patient Goals/Self-Care Activities Patient will:  - take medications as prescribed focus on medication adherence by routine target a minimum of 150 minutes of moderate intensity exercise weekly -Avoid screens within 1 hour of bedtime -Increase Ozempic to 1 mg gradually     Medication Assistance: None required.  Patient affirms current coverage meets needs.  Compliance/Adherence/Medication fill history: Care Gaps: COVID booster (due 06/10/20) Shingrix (due 10/18/20)  Star-Rating Drugs: Lisinopril - LF 09/15/20 x 90 ds Metformin - LF 10/13/20 x 30 ds  Patient's preferred pharmacy is:  [id number] [place], [id number]: [phone]: [phone] Uses pill box? No - prefers bottles Pt endorses 100% compliance  We discussed: Current pharmacy is preferred with insurance plan and patient is satisfied with pharmacy services Patient decided to: Continue current medication management strategy  Care Plan and Follow Up Patient Decision:  Patient agrees to Care Plan and Follow-up.  Plan: Telephone follow up appointment with care management team member scheduled for:  1 month  LCharlene Brooke PharmD, [place] CPP Clinical Pharmacist [phone]

## 2020-11-23 NOTE — Patient Instructions (Signed)
Visit Information  Phone number for Pharmacist: [phone]   Goals Addressed             This Visit's Progress    Manage My Medicine       Timeframe:  Long-Range Goal Priority:  Medium Start Date:    10/26/20                         Expected End Date:     04/28/21                  Follow Up Date Nov 2022   - call for medicine refill 2 or 3 days before it runs out - call if I am sick and can't take my medicine - keep a list of all the medicines I take; vitamins and herbals too  -Avoid screens within 1 hour of bedtime -Increase Ozempic to 1 mg gradually   Why is this important?   These steps will help you keep on track with your medicines.   Notes:         Care Plan : CCM Pharmacy Care Plan  Updates made by Charlton Haws, RPH since 11/23/2020 12:00 AM     Problem: Hypertension, Hyperlipidemia, Asthma, and Anxiety, Obesity, Prediabetes   Priority: High     Long-Range Goal: Disease management   Start Date: 10/26/2020  Expected End Date: 10/26/2021  This Visit's Progress: On track  Recent Progress: On track  Priority: High  Note:   Current Barriers:  Unable to independently monitor therapeutic efficacy Suboptimal therapeutic regimen for weight loss  Pharmacist Clinical Goal(s):  Patient will achieve adherence to monitoring guidelines and medication adherence to achieve therapeutic efficacy adhere to plan to optimize therapeutic regimen for weight loss as evidenced by report of adherence to recommended medication management changes through collaboration with PharmD and provider.   Interventions: 1:1 collaboration with Binnie Rail, MD regarding development and update of comprehensive plan of care as evidenced by provider attestation and co-signature Inter-disciplinary care team collaboration (see longitudinal plan of care) Comprehensive medication review performed; medication list updated in electronic medical record  Hypertension (BP goal  <140/90) -Controlled - BP is at goal in office and at home per patient report; pt endorses compliance; she reports occasional dry cough with lisinopril but states it is not a problem for her -Current treatment: Lisinopril 40 mg daily HCTZ 12.5 mg daily -Medications previously tried: verapamil  -Recommended to continue current medication  Prediabetes (A1c goal <6.5%) -Controlled - medication is for weight loss, PCOS and prediabetes; pt reports initial GI upset when she switched from Victoza to [place], but side effects mostly resolved by week 3-4; she has last ~3 lbs since 8/1 -Current medications: Metformin XR 500 mg BID Ozempic 0.5 mg weekly (Wed) -Current exercise: swimming 30 min twice a day -Educated on A1c and blood sugar goals, prediabetes range -Assessed pt finances - she reports Ozempic was even cheaper than Victoza -Plan: Increase Ozempic to 1 mg gradually   Depression/Anxiety (Goal: manage symptoms) -Controlled - pt reports only citalopram is for hx of panic attacks; pt has successfully discontinued both bupropion and topiramate (ineffective for wt loss); -Current treatment: Citalopram 20 mg daily -Recommend to continue current medication  Asthma (Goal: manage symptoms; prevent exacerbations) -Controlled - pt reports levocetirizine is not working very well; it was added last month to replace montelukast which was also ineffective -Current treatment  Albuterol HFA prn Levocetirizine 5 mg daily -Recommend to continue  current medication  Pain (Goal: manage symptoms) -Controlled - pt saw new rheumatologist this month and methotrexate was started; pt stopped after 3 doses due to side effects; she has f/u appt in September -Inflammatory polyarthritis; generalized osteoarthritis; cervical disc disorder; fibromyalgia -Current treatment  Celecoxib 200 mg BID Hydroxychloroquine 200 mg BID Methotrexate 12.5 mg weekly - pt stopped after 3 doses -Counseled on alternative  immunotherapy options including biologics; advised pt to keep appt with rheumatologist to discuss options next month -Recommended to continue current medication  Insomnia (Goal: improve sleep) -Not ideally controlled - pt reports sleep troubles is a long term issue for her but worse in the past week; she was "doing nothing" during the days last week after hernia dx, which made it harder for her to sleep at night -typical bedtime 9pm, trouble falling asleep, waking up every few hours -Medications previously tried: melatonin, trazodone, Tylenol PM  -Counseled on sleep hygiene; importance of routine sleep schedule; avoiding screens within 1 hr of bedtime  Patient Goals/Self-Care Activities Patient will:  - take medications as prescribed focus on medication adherence by routine target a minimum of 150 minutes of moderate intensity exercise weekly -Avoid screens within 1 hour of bedtime -Increase Ozempic to 1 mg gradually      Patient verbalizes understanding of instructions provided today and agrees to view in [place].  Telephone follow up appointment with pharmacy team member scheduled for: 3 months  Charlene Brooke, PharmD, [place], CPP Clinical Pharmacist [facility] [phone]

## 2020-12-02 ENCOUNTER — Ambulatory Visit (INDEPENDENT_AMBULATORY_CARE_PROVIDER_SITE_OTHER): Payer: PPO | Admitting: Family Medicine

## 2020-12-02 ENCOUNTER — Other Ambulatory Visit: Payer: Self-pay

## 2020-12-02 ENCOUNTER — Encounter (INDEPENDENT_AMBULATORY_CARE_PROVIDER_SITE_OTHER): Payer: Self-pay | Admitting: Family Medicine

## 2020-12-02 VITALS — BP 112/76 | HR 70 | Temp 98.5°F | Ht 68.0 in | Wt 233.0 lb

## 2020-12-02 DIAGNOSIS — Z6835 Body mass index (BMI) 35.0-35.9, adult: Secondary | ICD-10-CM

## 2020-12-02 DIAGNOSIS — R7303 Prediabetes: Secondary | ICD-10-CM | POA: Diagnosis not present

## 2020-12-02 NOTE — Progress Notes (Signed)
Chief Complaint:   OBESITY [name] is here to discuss her progress with her obesity treatment plan along with follow-up of her obesity related diagnoses. [name] is on following a lower carbohydrate, vegetable and lean protein rich diet plan and states she is following her eating plan approximately 0% of the time. [name] states she is swimming for 60 minutes 3 times per week.  Today's visit was #: 49 Starting weight: 241 lbs Starting date: 09/28/2016 Today's weight: 233 lbs Today's date: 12/02/2020 Total lbs lost to date: 8 Total lbs lost since last in-office visit: 1  Interim History: [name] continues to work on diet and weight loss. She is exercising regularly. She is mostly trying to portion control and make smarter choices, and increase lean protein.  Subjective:   1. Pre-diabetes [name] is stable on Ozempic, and she is working on diet and weight loss.  Assessment/Plan:   1. Pre-diabetes [name] will continue Ozempic at 1 mg and will continue to work on weight loss, exercise, and decreasing simple carbohydrates to help decrease the risk of diabetes. We will check labs at her next visit.  2. Obesity with current BMI 35.5 [name] is currently in the action stage of change. As such, her goal is to continue with weight loss efforts. She has agreed to keeping a food journal and adhering to recommended goals of 1000-1300 calories and 75+ grams of protein daily.   We will recheck fasting labs at her next visit.  Exercise goals: As is.  Behavioral modification strategies: increasing lean protein intake.  [name] has agreed to follow-up with our clinic in 4 weeks. She was informed of the importance of frequent follow-up visits to maximize her success with intensive lifestyle modifications for her multiple health conditions.   Objective:   Blood pressure 112/76, pulse 70, temperature 98.5 F (36.9 C), height '5\' 8"'$  (1.727 m), weight 233 lb (105.7 kg), SpO2 97 %. Body  mass index is 35.43 kg/m.  General: Cooperative, alert, well developed, in no acute distress. HEENT: Conjunctivae and lids unremarkable. Cardiovascular: Regular rhythm.  Lungs: Normal work of breathing. Neurologic: No focal deficits.   Lab Results  Component Value Date   CREATININE 0.80 08/13/2020   BUN 25 (H) 08/13/2020   NA 140 08/13/2020   K 4.1 08/13/2020   CL 105 08/13/2020   CO2 22 08/13/2020   Lab Results  Component Value Date   ALT 30 08/13/2020   AST 24 08/13/2020   ALKPHOS 94 08/13/2020   BILITOT 0.3 08/13/2020   Lab Results  Component Value Date   HGBA1C 5.8 (H) 08/13/2020   HGBA1C 5.9 04/28/2020   HGBA1C 5.5 11/11/2019   HGBA1C 5.3 07/08/2019   HGBA1C 5.4 02/21/2019   Lab Results  Component Value Date   INSULIN 14.2 08/13/2020   INSULIN 25.1 (H) 04/30/2020   INSULIN 16.4 11/11/2019   INSULIN 17.3 02/21/2019   INSULIN 14.1 10/29/2018   Lab Results  Component Value Date   TSH 2.040 08/13/2020   Lab Results  Component Value Date   CHOL 155 08/13/2020   HDL 53 08/13/2020   LDLCALC 82 08/13/2020   TRIG 112 08/13/2020   CHOLHDL 3 08/16/2016   Lab Results  Component Value Date   VD25OH 34.8 08/13/2020   VD25OH 36.7 04/30/2020   VD25OH 37.1 11/11/2019   Lab Results  Component Value Date   WBC 6.7 08/13/2020   HGB 13.0 08/13/2020   HCT 38.8 08/13/2020   MCV 84 08/13/2020   PLT 266  08/13/2020   No results found for: IRON, TIBC, FERRITIN  Attestation Statements:   Reviewed by clinician on day of visit: allergies, medications, problem list, medical history, surgical history, family history, social history, and previous encounter notes.  Time spent on visit including pre-visit chart review and post-visit care and charting was 36 minutes.    I, Trixie Dredge, am acting as transcriptionist for Dennard Nip, MD.  I have reviewed the above documentation for accuracy and completeness, and I agree with the above. -  Dennard Nip, MD

## 2020-12-05 ENCOUNTER — Other Ambulatory Visit: Payer: Self-pay | Admitting: Internal Medicine

## 2020-12-11 ENCOUNTER — Other Ambulatory Visit: Payer: Self-pay | Admitting: Internal Medicine

## 2020-12-11 DIAGNOSIS — E8881 Metabolic syndrome: Secondary | ICD-10-CM

## 2020-12-11 DIAGNOSIS — R7303 Prediabetes: Secondary | ICD-10-CM

## 2020-12-16 DIAGNOSIS — Z79899 Other long term (current) drug therapy: Secondary | ICD-10-CM | POA: Diagnosis not present

## 2020-12-16 DIAGNOSIS — L4050 Arthropathic psoriasis, unspecified: Secondary | ICD-10-CM | POA: Diagnosis not present

## 2020-12-16 DIAGNOSIS — M159 Polyosteoarthritis, unspecified: Secondary | ICD-10-CM | POA: Diagnosis not present

## 2020-12-16 DIAGNOSIS — L409 Psoriasis, unspecified: Secondary | ICD-10-CM | POA: Diagnosis not present

## 2020-12-31 ENCOUNTER — Ambulatory Visit (INDEPENDENT_AMBULATORY_CARE_PROVIDER_SITE_OTHER): Payer: PPO | Admitting: Family Medicine

## 2020-12-31 ENCOUNTER — Encounter (INDEPENDENT_AMBULATORY_CARE_PROVIDER_SITE_OTHER): Payer: Self-pay | Admitting: Family Medicine

## 2020-12-31 ENCOUNTER — Other Ambulatory Visit: Payer: Self-pay

## 2020-12-31 VITALS — BP 118/79 | HR 73 | Temp 98.4°F | Ht 68.0 in | Wt 225.0 lb

## 2020-12-31 DIAGNOSIS — Z6836 Body mass index (BMI) 36.0-36.9, adult: Secondary | ICD-10-CM | POA: Diagnosis not present

## 2020-12-31 DIAGNOSIS — R5383 Other fatigue: Secondary | ICD-10-CM

## 2020-12-31 NOTE — Progress Notes (Signed)
Chief Complaint:   OBESITY [name] is here to discuss her progress with her obesity treatment plan along with follow-up of her obesity related diagnoses. [name] is on keeping a food journal and adhering to recommended goals of 1000-1300 calories and 75+ grams of protein daily and states she is following her eating plan approximately 0% of the time. [name] states she is doing 0 minutes 0 times per week.  Today's visit was #: 15 Starting weight: 241 lbs Starting date: 09/28/2016 Today's weight: 225 lbs Today's date: 12/31/2020 Total lbs lost to date: 16 Total lbs lost since last in-office visit: 8  Interim History: [name] has done well with weight loss on Ozempic. She was advised by her pharmacy to increased her dose and she now cannot eat much food. This has helped with weight loss, but she will rapidly decreasing her RMR and will guarantee weight gain.  Subjective:   1. Fatigue, unspecified type [name] notes increased fatigue, getting irregular sleep and she has rapidly increased her Ozempic as instructed, all of which could affect her energy level.  Assessment/Plan:   1. Fatigue, unspecified type [name]'s recent labs were reviewed, and additional labs were checked today. [name] is to decrease Ozempic and we will follow up in 1 month.  - Vitamin B12 - CMP14+EGFR - Lipid Panel With LDL/HDL Ratio - Insulin, random - Hemoglobin A1c - VITAMIN D 25 Hydroxy (Vit-D Deficiency, Fractures) - TSH  2. Obesity with current BMI 34.2 [name] is currently in the action stage of change. As such, her goal is to continue with weight loss efforts. She has agreed to following a lower carbohydrate, vegetable and lean protein rich diet plan.   [name] is to decrease Ozempic to 0.5 mg and we will follow up at her next visit.  Behavioral modification strategies: increasing lean protein intake.  [name] has agreed to follow-up with our clinic in 4 weeks. She was informed of the  importance of frequent follow-up visits to maximize her success with intensive lifestyle modifications for her multiple health conditions.   [name] was informed we would discuss her lab results at her next visit unless there is a critical issue that needs to be addressed sooner. [name] agreed to keep her next visit at the agreed upon time to discuss these results.  Objective:   Blood pressure 118/79, pulse 73, temperature 98.4 F (36.9 C), height '5\' 8"'  (1.727 m), weight 225 lb (102.1 kg), SpO2 98 %. Body mass index is 34.21 kg/m.  General: Cooperative, alert, well developed, in no acute distress. HEENT: Conjunctivae and lids unremarkable. Cardiovascular: Regular rhythm.  Lungs: Normal work of breathing. Neurologic: No focal deficits.   Lab Results  Component Value Date   CREATININE 0.80 08/13/2020   BUN 25 (H) 08/13/2020   NA 140 08/13/2020   K 4.1 08/13/2020   CL 105 08/13/2020   CO2 22 08/13/2020   Lab Results  Component Value Date   ALT 30 08/13/2020   AST 24 08/13/2020   ALKPHOS 94 08/13/2020   BILITOT 0.3 08/13/2020   Lab Results  Component Value Date   HGBA1C 5.8 (H) 08/13/2020   HGBA1C 5.9 04/28/2020   HGBA1C 5.5 11/11/2019   HGBA1C 5.3 07/08/2019   HGBA1C 5.4 02/21/2019   Lab Results  Component Value Date   INSULIN 14.2 08/13/2020   INSULIN 25.1 (H) 04/30/2020   INSULIN 16.4 11/11/2019   INSULIN 17.3 02/21/2019   INSULIN 14.1 10/29/2018   Lab Results  Component Value Date  TSH 2.040 08/13/2020   Lab Results  Component Value Date   CHOL 155 08/13/2020   HDL 53 08/13/2020   LDLCALC 82 08/13/2020   TRIG 112 08/13/2020   CHOLHDL 3 08/16/2016   Lab Results  Component Value Date   VD25OH 34.8 08/13/2020   VD25OH 36.7 04/30/2020   VD25OH 37.1 11/11/2019   Lab Results  Component Value Date   WBC 6.7 08/13/2020   HGB 13.0 08/13/2020   HCT 38.8 08/13/2020   MCV 84 08/13/2020   PLT 266 08/13/2020   No results found for: IRON, TIBC,  FERRITIN  Attestation Statements:   Reviewed by clinician on day of visit: allergies, medications, problem list, medical history, surgical history, family history, social history, and previous encounter notes.  Time spent on visit including pre-visit chart review and post-visit care and charting was 35 minutes.    I, Trixie Dredge, am acting as transcriptionist for Dennard Nip, MD.  I have reviewed the above documentation for accuracy and completeness, and I agree with the above. -  Dennard Nip, MD

## 2021-01-01 DIAGNOSIS — M5416 Radiculopathy, lumbar region: Secondary | ICD-10-CM | POA: Insufficient documentation

## 2021-01-01 DIAGNOSIS — M160 Bilateral primary osteoarthritis of hip: Secondary | ICD-10-CM | POA: Diagnosis not present

## 2021-01-01 LAB — LIPID PANEL WITH LDL/HDL RATIO
Cholesterol, Total: 136 mg/dL (ref 100–199)
HDL: 53 mg/dL (ref >39)
LDL Chol Calc (NIH): 66 mg/dL (ref 0–99)
LDL/HDL Ratio: 1.2 ratio (ref 0.0–3.2)
Triglycerides: 89 mg/dL (ref 0–149)
VLDL Cholesterol Cal: 17 mg/dL (ref 5–40)

## 2021-01-01 LAB — INSULIN, RANDOM: INSULIN: 17.9 u[IU]/mL (ref 2.6–24.9)

## 2021-01-01 LAB — CMP14+EGFR
ALT: 50 IU/L — ABNORMAL HIGH (ref 0–32)
AST: 34 IU/L (ref 0–40)
Albumin/Globulin Ratio: 1.9 (ref 1.2–2.2)
Albumin: 4.5 g/dL (ref 3.8–4.9)
Alkaline Phosphatase: 92 IU/L (ref 44–121)
BUN/Creatinine Ratio: 20 (ref 9–23)
BUN: 24 mg/dL (ref 6–24)
Bilirubin Total: 0.2 mg/dL (ref 0.0–1.2)
CO2: 20 mmol/L (ref 20–29)
Calcium: 9.4 mg/dL (ref 8.7–10.2)
Chloride: 104 mmol/L (ref 96–106)
Creatinine, Ser: 1.21 mg/dL — ABNORMAL HIGH (ref 0.57–1.00)
Globulin, Total: 2.4 g/dL (ref 1.5–4.5)
Glucose: 81 mg/dL (ref 70–99)
Potassium: 4.3 mmol/L (ref 3.5–5.2)
Sodium: 141 mmol/L (ref 134–144)
Total Protein: 6.9 g/dL (ref 6.0–8.5)
eGFR: 54 mL/min/{1.73_m2} — ABNORMAL LOW (ref >59)

## 2021-01-01 LAB — VITAMIN D 25 HYDROXY (VIT D DEFICIENCY, FRACTURES): Vit D, 25-Hydroxy: 45.9 ng/mL (ref 30.0–100.0)

## 2021-01-01 LAB — VITAMIN B12: Vitamin B-12: 493 pg/mL (ref 232–1245)

## 2021-01-01 LAB — HEMOGLOBIN A1C
Est. average glucose Bld gHb Est-mCnc: 117 mg/dL
Hgb A1c MFr Bld: 5.7 % — ABNORMAL HIGH (ref 4.8–5.6)

## 2021-01-01 LAB — TSH: TSH: 2.59 u[IU]/mL (ref 0.450–4.500)

## 2021-01-04 DIAGNOSIS — M25512 Pain in left shoulder: Secondary | ICD-10-CM | POA: Diagnosis not present

## 2021-01-04 DIAGNOSIS — M25511 Pain in right shoulder: Secondary | ICD-10-CM | POA: Diagnosis not present

## 2021-01-06 DIAGNOSIS — M25512 Pain in left shoulder: Secondary | ICD-10-CM | POA: Diagnosis not present

## 2021-01-06 DIAGNOSIS — M25511 Pain in right shoulder: Secondary | ICD-10-CM | POA: Diagnosis not present

## 2021-01-07 ENCOUNTER — Other Ambulatory Visit: Payer: Self-pay | Admitting: Internal Medicine

## 2021-01-07 DIAGNOSIS — E8881 Metabolic syndrome: Secondary | ICD-10-CM

## 2021-01-07 DIAGNOSIS — R7303 Prediabetes: Secondary | ICD-10-CM

## 2021-01-18 DIAGNOSIS — M75122 Complete rotator cuff tear or rupture of left shoulder, not specified as traumatic: Secondary | ICD-10-CM | POA: Diagnosis not present

## 2021-01-18 DIAGNOSIS — M75121 Complete rotator cuff tear or rupture of right shoulder, not specified as traumatic: Secondary | ICD-10-CM | POA: Diagnosis not present

## 2021-01-22 ENCOUNTER — Telehealth: Payer: Self-pay

## 2021-01-22 NOTE — Progress Notes (Signed)
    Chronic Care Management Pharmacy Assistant   Name: [name]  MRN: [id number] DOB: 07/31/1970   Reason for Encounter: Disease State   Conditions to be addressed/monitored: General  Recent office visits:  None ID  Recent consult visits:  Starlyn Skeans, MD-Family Medicine (Pre-diabetes) no med changes  Hospital visits:  None in previous 6 months  Medications: Outpatient Encounter Medications as of 01/22/2021  Medication Sig   albuterol (VENTOLIN HFA) 108 (90 Base) MCG/ACT inhaler TAKE 2 PUFFS BY MOUTH EVERY 6 HOURS AS NEEDED FOR WHEEZE OR SHORTNESS OF BREATH (Patient taking differently: Inhale 2 puffs into the lungs every 6 (six) hours as needed for shortness of breath or wheezing.)   Blood Glucose Monitoring Suppl (ONE TOUCH ULTRA 2) w/Device KIT OneTouch Ultra2 Meter   celecoxib (CELEBREX) 200 MG capsule Take 200 mg by mouth 2 (two) times daily.   citalopram (CELEXA) 20 MG tablet TAKE 1 TABLET BY MOUTH EVERY DAY   diphenhydramine-acetaminophen (TYLENOL PM) 25-500 MG TABS tablet Take 2 tablets by mouth at bedtime.   famotidine (PEPCID) 20 MG tablet TAKE 1 TABLET BY MOUTH EVERYDAY AT BEDTIME   glucose blood (ONETOUCH ULTRA) test strip TEST BLOOD SUGARS ONCE A DAY   hydrochlorothiazide (MICROZIDE) 12.5 MG capsule TAKE 1 CAPSULE BY MOUTH EVERY DAY   hydroxychloroquine (PLAQUENIL) 200 MG tablet Take 200 mg by mouth 2 (two) times daily.   Insulin Pen Needle (BD PEN NEEDLE NANO U/F) 32G X 4 MM MISC 1 each by Other route daily.   levocetirizine (XYZAL) 5 MG tablet Take 1 tablet (5 mg total) by mouth every evening.   lisinopril (ZESTRIL) 40 MG tablet TAKE 1 TABLET BY MOUTH EVERY DAY   metFORMIN (GLUCOPHAGE-XR) 500 MG 24 hr tablet TAKE 1 TABLET (500 MG TOTAL) BY MOUTH 2 (TWO) TIMES DAILY BEFORE A MEAL.   montelukast (SINGULAIR) 10 MG tablet TAKE 1 TABLET BY MOUTH EVERYDAY AT BEDTIME   OneTouch Delica Lancets 25E MISC 1 each by Does not apply route 2 (two) times daily.    Semaglutide, 1 MG/DOSE, (OZEMPIC, 1 MG/DOSE,) 4 MG/3ML SOPN Inject 1 mg into the skin once a week.   No facility-administered encounter medications on file as of 01/22/2021.   Have you had any problems recently with your health? Patient states that she does not have any new health issues  Have you had any problems with your pharmacy? Patient states that she does not have any problems with getting her medications from the pharmacy or the cost of medications  What issues or side effects are you having with your medications? Patient stated that she was at one time having stomach problems on Ozempic, but it was decreased and she not longer has any problems  What would you like me to pass along to Rodena Goldmann for them to help you with? Patient states she is doing fine  What can we do to take care of you better? Patient states, nothing at this time  Care Gaps: Colonoscopy-07/15/19 Diabetic Foot Exam- Mammogram-08/27/20 Ophthalmology-NA Dexa Scan - NA Annual Well Visit - NA Micro albumin-NA Hemoglobin A1c- 12/31/20  Star Rating Drugs: Metformin 500 mg last fill 01/07/21 Lisinopril 40 mg last fill 11/22/20   Ethelene Hal Clinical Pharmacist Assistant [phone]

## 2021-01-26 ENCOUNTER — Encounter (INDEPENDENT_AMBULATORY_CARE_PROVIDER_SITE_OTHER): Payer: Self-pay | Admitting: Family Medicine

## 2021-01-26 ENCOUNTER — Other Ambulatory Visit: Payer: Self-pay

## 2021-01-26 ENCOUNTER — Ambulatory Visit (INDEPENDENT_AMBULATORY_CARE_PROVIDER_SITE_OTHER): Payer: PPO | Admitting: Family Medicine

## 2021-01-26 VITALS — BP 104/64 | HR 80 | Temp 99.0°F | Ht 68.0 in | Wt 225.0 lb

## 2021-01-26 DIAGNOSIS — Z6836 Body mass index (BMI) 36.0-36.9, adult: Secondary | ICD-10-CM | POA: Diagnosis not present

## 2021-01-26 DIAGNOSIS — E8881 Metabolic syndrome: Secondary | ICD-10-CM

## 2021-01-26 MED ORDER — OZEMPIC (0.25 OR 0.5 MG/DOSE) 2 MG/1.5ML ~~LOC~~ SOPN: 0.5000 mg | PEN_INJECTOR | SUBCUTANEOUS | 0 refills | Status: DC

## 2021-01-26 NOTE — Progress Notes (Signed)
Chief Complaint:   OBESITY [name] is here to discuss her progress with her obesity treatment plan along with follow-up of her obesity related diagnoses. [name] is on following a lower carbohydrate, vegetable and lean protein rich diet plan and states she is following her eating plan approximately 50% of the time. [name] states she is walking for 60 minutes 2 times per week.  Today's visit was #: 46 Starting weight: 241 lbs Starting date: 09/28/2016 Today's weight: 225 lbs Today's date: 01/26/2021 Total lbs lost to date: 16 Total lbs lost since last in-office visit: 0  Interim History: [name] has done well maintaining her weight despite extra challenges with being on prednisone. She worked on minimizing weight gain and she tried  to stay active.  Subjective:   1. Insulin resistance [name] is on Ozempic and she decreased her dose. She had some constipation especially on Ozempic and with pain medications. She cut back to approximately 0.5 mg for now.  Assessment/Plan:   1. Insulin resistance [name] will continue Ozempic at 0.5 mg. She is ok to slowly increase pen clicks to the equivalent of 0.75 mg weekly. She will continue with diet, exercise, and decreasing simple carbohydrates to help decrease the risk of diabetes. [name] agreed to follow-up with [place] as directed to closely monitor her progress.  2. Obesity with current BMI 34.3 [name] is currently in the action stage of change. As such, her goal is to maintain weight for now. She has agreed to keeping a food journal and adhering to recommended goals of 1000-1300 calories and 75+ grams of protein daily.   [name]'s goal is to maintain her weight loss over the holidays.  Exercise goals: As is.  Behavioral modification strategies: keeping a strict food journal.  [name] has agreed to follow-up with our clinic in 5 to 6 weeks. She was informed of the importance of frequent follow-up visits to maximize her success  with intensive lifestyle modifications for her multiple health conditions.   Objective:   Blood pressure 104/64, pulse 80, temperature 99 F (37.2 C), height 5\' 8"  (1.727 m), weight 225 lb (102.1 kg), SpO2 98 %. Body mass index is 34.21 kg/m.  General: Cooperative, alert, well developed, in no acute distress. HEENT: Conjunctivae and lids unremarkable. Cardiovascular: Regular rhythm.  Lungs: Normal work of breathing. Neurologic: No focal deficits.   Lab Results  Component Value Date   CREATININE 1.21 (H) 12/31/2020   BUN 24 12/31/2020   NA 141 12/31/2020   K 4.3 12/31/2020   CL 104 12/31/2020   CO2 20 12/31/2020   Lab Results  Component Value Date   ALT 50 (H) 12/31/2020   AST 34 12/31/2020   ALKPHOS 92 12/31/2020   BILITOT 0.2 12/31/2020   Lab Results  Component Value Date   HGBA1C 5.7 (H) 12/31/2020   HGBA1C 5.8 (H) 08/13/2020   HGBA1C 5.9 04/28/2020   HGBA1C 5.5 11/11/2019   HGBA1C 5.3 07/08/2019   Lab Results  Component Value Date   INSULIN 17.9 12/31/2020   INSULIN 14.2 08/13/2020   INSULIN 25.1 (H) 04/30/2020   INSULIN 16.4 11/11/2019   INSULIN 17.3 02/21/2019   Lab Results  Component Value Date   TSH 2.590 12/31/2020   Lab Results  Component Value Date   CHOL 136 12/31/2020   HDL 53 12/31/2020   LDLCALC 66 12/31/2020   TRIG 89 12/31/2020   CHOLHDL 3 08/16/2016   Lab Results  Component Value Date   VD25OH 45.9 12/31/2020   VD25OH  34.8 08/13/2020   VD25OH 36.7 04/30/2020   Lab Results  Component Value Date   WBC 6.7 08/13/2020   HGB 13.0 08/13/2020   HCT 38.8 08/13/2020   MCV 84 08/13/2020   PLT 266 08/13/2020   No results found for: IRON, TIBC, FERRITIN  Attestation Statements:   Reviewed by clinician on day of visit: allergies, medications, problem list, medical history, surgical history, family history, social history, and previous encounter notes.  Time spent on visit including pre-visit chart review and post-visit care and  charting was 25 minutes.    I, Trixie Dredge, am acting as transcriptionist for Dennard Nip, MD.  I have reviewed the above documentation for accuracy and completeness, and I agree with the above. -  Dennard Nip, MD

## 2021-01-31 ENCOUNTER — Other Ambulatory Visit: Payer: Self-pay | Admitting: Internal Medicine

## 2021-01-31 DIAGNOSIS — E8881 Metabolic syndrome: Secondary | ICD-10-CM

## 2021-01-31 DIAGNOSIS — R7303 Prediabetes: Secondary | ICD-10-CM

## 2021-02-01 DIAGNOSIS — M5416 Radiculopathy, lumbar region: Secondary | ICD-10-CM | POA: Diagnosis not present

## 2021-02-01 DIAGNOSIS — Z91041 Radiographic dye allergy status: Secondary | ICD-10-CM | POA: Diagnosis not present

## 2021-02-05 ENCOUNTER — Telehealth: Payer: Self-pay | Admitting: Internal Medicine

## 2021-02-05 NOTE — Telephone Encounter (Signed)
Patient states since taking Semaglutide, 1 MG/DOSE, (OZEMPIC, 1 MG/DOSE,) 4 MG/3ML SOPN she is having constant rib pain, gas, and constipation  Patient is requesting a call back to discuss side effects

## 2021-02-07 NOTE — Telephone Encounter (Signed)
We can reduce the dose to see if she tolerates a lower dose better or try a different medication - the similar medications may have the same side effects.   Is she take the 0.5 mg dose?

## 2021-02-08 NOTE — Telephone Encounter (Signed)
Spoke with patient today.  She did drop dosage this weekend to 0.5 and she has been fine since then.  Burning sensation has subsided as well.  She states she has f/u at the end of the month with [facility] and may need to follow up then based on what her labs show.

## 2021-02-16 ENCOUNTER — Other Ambulatory Visit: Payer: Self-pay

## 2021-02-16 ENCOUNTER — Ambulatory Visit (INDEPENDENT_AMBULATORY_CARE_PROVIDER_SITE_OTHER): Payer: PPO

## 2021-02-16 DIAGNOSIS — M064 Inflammatory polyarthropathy: Secondary | ICD-10-CM

## 2021-02-16 DIAGNOSIS — E8881 Metabolic syndrome: Secondary | ICD-10-CM

## 2021-02-16 DIAGNOSIS — I10 Essential (primary) hypertension: Secondary | ICD-10-CM

## 2021-02-16 DIAGNOSIS — R7303 Prediabetes: Secondary | ICD-10-CM

## 2021-02-16 NOTE — Patient Instructions (Signed)
Karie Mainland,  It was great to talk to you today!  Please call me with any questions or concerns.  Visit Information Patient verbalizes understanding of instructions provided today and agrees to view in [place].   Telephone follow up appointment with care management team member scheduled for: The patient has been provided with contact information for the care management team and has been advised to call with any health related questions or concerns.   Tomasa Blase, PharmD Clinical Pharmacist, [place]

## 2021-02-16 NOTE — Progress Notes (Signed)
Chronic Care Management Pharmacy Note  02/16/2021 Name:  [name] MRN:  [id number] DOB:  May 06, 1970  Summary: -Patient reports that since last visit she has reduced ozempic down to 0.36m weekly as she was not able to tolerate the 162mweekly dose due to GI upset / diarrhea  -Patient has also stopped mtx, was started on humira by rheumatology but stopped this as well due to not liking the way the injection made her feel - patient has follow up with rheumatology in January  -Patient is scheduled for injection in back tomorrow, will be on a 3 day course of prednisone for the injection   Recommendations/Changes made from today's visit: -Recommending no changes to medications, patient would eventually like to increase ozmepic dose, recommended for patient to continue 0.22m41meekly dose for now as she is stable and GI issues have almost completely resolved can discuss with next visit about possible increase if still desired    Subjective: [name] an [age]o. year old female who is a primary patient of Burns, StaClaudina LickD.  The CCM team was consulted for assistance with disease management and care coordination needs.    Engaged with patient by telephone for follow up visit in response to provider referral for pharmacy case management and/or care coordination services.   Consent to Services:  The patient was given information about Chronic Care Management services, agreed to services, and gave verbal consent prior to initiation of services.  Please see initial visit note for detailed documentation.   Patient Care Team: BurBinnie RailD as PCP - General (Internal Medicine) HawGavin PoundD as Consulting Physician (Rheumatology) [facility]Charlton [facility]PH[facility] Pharmacist (Pharmacist)  Recent office visits: None since last visit with CCM pharmacy   Recent consult visits: 01/[facility]2021 [facility] - ozempic dose was decreased by patient back to  0.22mg20mekly due to constipation - patient to increase up to 0.722mg54mkly - f/u in 6 weeks  12/31/2020- Dr. BeaslMoshe [facility] management - ozempic decreased to 0.22mg w93mly - follow up in 4 weeks  12/16/2020 - Dr. Patel Posey [facility] - stop mtx due to GI upset - start humira  12/02/2020 - Dr. BeasleLeafy [facility] Weight management- continue medications and diet, f/u in 4 weeks   Hospital visits: None in previous 6 months    Objective:  Lab Results  Component Value Date   CREATININE 1.21 (H) 12/31/2020   BUN 24 12/31/2020   GFR 69.89 04/28/2020   GFRNONAA 109 11/11/2019   GFRAA 125 11/11/2019   NA 141 12/31/2020   K 4.3 12/31/2020   CALCIUM 9.4 12/31/2020   CO2 20 12/31/2020   GLUCOSE 81 12/31/2020    Lab Results  Component Value Date/Time   HGBA1C 5.7 (H) 12/31/2020 09:36 AM   HGBA1C 5.8 (H) 08/13/2020 12:50 PM   GFR 69.89 04/28/2020 02:44 PM   GFR 103.96 08/16/2016 10:11 AM    Last diabetic Eye exam: No results found for: HMDIABEYEEXA  Last diabetic Foot exam: No results found for: HMDIABFOOTEX   Lab Results  Component Value Date   CHOL 136 12/31/2020   HDL 53 12/31/2020   LDLCALC 66 12/31/2020   TRIG 89 12/31/2020   CHOLHDL 3 08/16/2016    Hepatic Function Latest Ref Rng & Units 12/31/2020 08/13/2020 04/28/2020  Total Protein 6.0 - 8.5 g/dL 6.9 6.7 7.5  Albumin 3.8 - 4.9 g/dL 4.5 4.0 4.4  AST 0 -  40 IU/L 34 24 19  ALT 0 - 32 IU/L 50(H) 30 21  Alk Phosphatase 44 - 121 IU/L 92 94 84  Total Bilirubin 0.0 - 1.2 mg/dL 0.2 0.3 0.3  Bilirubin, Direct 0.0 - 0.3 mg/dL - - -    Lab Results  Component Value Date/Time   TSH 2.590 12/31/2020 09:36 AM   TSH 2.040 08/13/2020 12:50 PM   FREET4 0.92 08/13/2020 12:50 PM   FREET4 0.83 11/11/2019 10:49 AM    CBC Latest Ref Rng & Units 08/13/2020 04/28/2020 11/11/2019  WBC 3.4 - 10.8 x10E3/uL 6.7 13.3(H) 5.8  Hemoglobin 11.1 - 15.9 g/dL 13.0 13.6 12.2  Hematocrit 34.0 - 46.6 % 38.8 42.2 37.2  Platelets 150 - 450 x10E3/uL 266  329.0 246    Lab Results  Component Value Date/Time   VD25OH 45.9 12/31/2020 09:36 AM   VD25OH 34.8 08/13/2020 12:50 PM    Clinical ASCVD: No  The 10-year ASCVD risk score (Arnett DK, et al., 2019) is: 0.8%   Values used to calculate the score:     Age: 47 years     Sex: Female     Is Non-Hispanic African American: No     Diabetic: No     Tobacco smoker: No     Systolic Blood Pressure: 935 mmHg     Is BP treated: Yes     HDL Cholesterol: 53 mg/dL     Total Cholesterol: 136 mg/dL    Depression screen [facility] 2/9 10/26/2020 10/26/2020 04/22/2020  Decreased Interest 0 0 0  Down, Depressed, Hopeless 0 0 0  PHQ - 2 Score 0 0 0  Altered sleeping 1 - 0  Tired, decreased energy 1 - 1  Change in appetite 0 - 0  Feeling bad or failure about yourself  0 - 0  Trouble concentrating 0 - 0  Moving slowly or fidgety/restless 0 - 0  Suicidal thoughts 0 - 0  PHQ-9 Score 2 - 1  Difficult doing work/chores Not difficult at all - Not difficult at all  Some recent data might be hidden     Social History   Tobacco Use  Smoking Status Never  Smokeless Tobacco Never   BP Readings from Last 3 Encounters:  01/26/21 104/64  12/31/20 118/79  12/02/20 112/76   Pulse Readings from Last 3 Encounters:  01/26/21 80  12/31/20 73  12/02/20 70   Wt Readings from Last 3 Encounters:  01/26/21 225 lb (102.1 kg)  12/31/20 225 lb (102.1 kg)  12/02/20 233 lb (105.7 kg)   BMI Readings from Last 3 Encounters:  01/26/21 34.21 kg/m  12/31/20 34.21 kg/m  12/02/20 35.43 kg/m    Assessment/Interventions: Review of patient past medical history, allergies, medications, health status, including review of consultants reports, laboratory and other test data, was performed as part of comprehensive evaluation and provision of chronic care management services.   SDOH:  (Social Determinants of Health) assessments and interventions performed: Yes  SDOH Screenings   Alcohol Screen: Low Risk    Last Alcohol  Screening Score (AUDIT): 2  Depression (PHQ2-9): Low Risk    PHQ-2 Score: 2  Financial Resource Strain: Low Risk    Difficulty of Paying Living Expenses: Not hard at all  Food Insecurity: No Food Insecurity   Worried About Charity fundraiser in the Last Year: Never true   Ran Out of Food in the Last Year: Never true  Housing: Low Risk    Last Housing Risk Score: 0  Physical Activity:  Sufficiently Active   Days of Exercise per Week: 7 days   Minutes of Exercise per Session: 30 min  Social Connections: Unknown   Frequency of Communication with Friends and Family: More than three times a week   Frequency of Social Gatherings with Friends and Family: More than three times a week   Attends Religious Services: Patient refused   Marine scientist or Organizations: Patient refused   Attends Music therapist: Patient refused   Marital Status: Married  Stress: No Stress Concern Present   Feeling of Stress : Not at all  Tobacco Use: Low Risk    Smoking Tobacco Use: Never   Smokeless Tobacco Use: Never   Passive Exposure: Not on file  Transportation Needs: No Transportation Needs   Lack of Transportation (Medical): No   Lack of Transportation (Non-Medical): No    CCM Care Plan  Allergies  Allergen Reactions   Chlorhexidine Gluconate Rash, Itching and Swelling    Developed severe rash where chloraprep was used on chest area   Ivp Dye [Iodinated Diagnostic Agents] Rash and Other (See Comments)    Flushing, minor facial rash & dyspnea   Nalbuphine Shortness Of Breath and Rash    Nubain caused respiratory distress & rash   Septra [Sulfamethoxazole-Trimethoprim] Shortness Of Breath and Rash   Adhesive [Tape] Rash   Diclofenac Other (See Comments)    GI Upset   Humira [Adalimumab]     "Did not like the way I felt"   Methotrexate Derivatives     GI upset     Augmentin [Amoxicillin-Pot Clavulanate] Nausea And Vomiting   Betadine [Povidone Iodine] Rash   Latex Rash     Medications Reviewed Today     Reviewed by Tomasa Blase, [facility] (Pharmacist) on 02/16/21 at 1357  Med List Status: <None>   Medication Order Taking? Sig Documenting Provider Last Dose Status Informant  albuterol (VENTOLIN HFA) 108 (90 Base) MCG/ACT inhaler [id number] No TAKE 2 PUFFS BY MOUTH EVERY 6 HOURS AS NEEDED FOR WHEEZE OR SHORTNESS OF BREATH  Patient taking differently: Inhale 2 puffs into the lungs every 6 (six) hours as needed for shortness of breath or wheezing.   Binnie Rail, MD Taking Active   Blood Glucose Monitoring Suppl (ONE TOUCH ULTRA 2) w/Device Drucie Opitz [id number] No OneTouch Ultra2 Meter [provider] Taking Active Self  celecoxib (CELEBREX) 200 MG capsule [id number] No Take 200 mg by mouth 2 (two) times daily. [provider] Taking Active Self  citalopram (CELEXA) 20 MG tablet [id number] No TAKE 1 TABLET BY MOUTH EVERY DAY Burns, Claudina Lick, MD Taking Active   diphenhydramine-acetaminophen (TYLENOL PM) 25-500 MG TABS tablet [id number] No Take 2 tablets by mouth at bedtime. [provider] Taking Active Self  famotidine (PEPCID) 20 MG tablet [id number] No TAKE 1 TABLET BY MOUTH EVERYDAY AT BEDTIME Burns, Claudina Lick, MD Taking Active   glucose blood [facility] ULTRA) test strip [id number] No TEST BLOOD SUGARS ONCE A DAY Leafy Ro, Caren D, MD Taking Active Self  hydrochlorothiazide (MICROZIDE) 12.5 MG capsule [id number] No TAKE 1 CAPSULE BY MOUTH EVERY DAY Burns, Claudina Lick, MD Taking Active   hydroxychloroquine (PLAQUENIL) 200 MG tablet [id number] No Take 200 mg by mouth 2 (two) times daily. [provider] Taking Active Self  Insulin Pen Needle (BD PEN NEEDLE NANO U/F) 32G X 4 MM MISC [id number] No 1 each by Other route daily. Dennard Nip D, MD Taking Active   levocetirizine (XYZAL) 5 MG tablet [id number] No  Take 1 tablet (5 mg total) by mouth every evening. Binnie Rail, MD Taking Active   lisinopril (ZESTRIL) 40 MG tablet [id number] No TAKE 1 TABLET  BY MOUTH EVERY DAY Burns, Claudina Lick, MD Taking Active   metFORMIN (GLUCOPHAGE-XR) 500 MG 24 hr tablet [id number]  TAKE 1 TABLET (500 MG TOTAL) BY MOUTH 2 (TWO) TIMES DAILY BEFORE A MEAL. Binnie Rail, MD  Active   OneTouch Delica Lancets 43P [place] [id number] No 1 each by Does not apply route 2 (two) times daily. Dennard Nip D, MD Taking Active   Discontinued 02/16/21 1357 (Change in therapy)   Semaglutide,0.25 or 0.5MG/DOS, (OZEMPIC, 0.25 OR 0.5 MG/DOSE,) 2 MG/1.5ML SOPN [id number]  Inject 0.5 mg into the skin once a week. Ok to slowly dial up pen clicks to the equivalent of .77m. 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   Depression 009/32/3557  Umbilical hernia 132/20/2542  Diverticulosis of colon 02/26/2017   Other hyperlipidemia 02/06/2017   Asthma 11/24/2016   Vitamin D deficiency 11/09/2016   Class 1 obesity with serious comorbidity and body mass index (BMI) of 33.0 to 33.9 in adult 11/09/2016   Right shoulder pain 10/11/2016   AC (acromioclavicular) joint arthritis 09/13/2016   Postsurgical menopause 08/16/2016   Mucoid cyst of joint 11/04/2015   External hemorrhoid 08/20/2015   Prediabetes 06/05/2015   GERD (gastroesophageal reflux disease) 06/04/2015   Status post bilateral breast reduction 04/01/2015   Cervical disc disorder with radiculopathy of cervical region 05/23/2014   Ulnar neuropathy 01/16/2014   Fibromyalgia 01/16/2014   Recurrent nephrolithiasis 04/29/2013   Arthralgia 04/29/2013   Panic attacks 01/15/2013   IBS (irritable bowel syndrome) 12/14/2011   B12 deficiency 06/30/2009   Essential hypertension  03/02/2007    Immunization History  Administered Date(s) Administered   Influenza Split 02/17/2011   Influenza Whole 01/02/2010   Influenza,inj,Quad PF,6+ Mos 01/02/2013, 01/11/2017, 01/03/2018, 02/07/2019   Influenza-Unspecified 01/01/2014, 01/08/2016, 01/11/2017, 03/04/2020   PFIZER(Purple Top)SARS-COV-2 Vaccination 07/16/2019, 08/06/2019, 03/12/2020   Pneumococcal Polysaccharide-23 06/04/2008, 05/02/2013   Tdap 08/14/2012   Zoster Recombinat (Shingrix) 08/23/2020    Conditions to be addressed/monitored:  Hypertension, Hyperlipidemia, Asthma, and Anxiety, Obesity, Prediabetes  Care Plan : [place] Updates made by STomasa Blase RCarlsbadsince 02/16/2021 12:00 AM     Problem: Hypertension, Hyperlipidemia, Asthma, and Anxiety, Obesity, Prediabetes   Priority: High     Long-Range Goal: Disease management   Start Date: 10/26/2020  Expected End Date: 10/26/2021  This Visit's Progress: On track  Recent Progress: On track  Priority: High  Note:   Current Barriers:  Unable to independently monitor therapeutic efficacy Suboptimal therapeutic regimen for weight loss  Pharmacist Clinical Goal(s):  Patient will achieve adherence to monitoring guidelines and medication adherence to achieve therapeutic efficacy adhere to plan to optimize therapeutic regimen for weight loss as evidenced by report of adherence to recommended medication management changes through collaboration with PharmD and provider.   Interventions: 1:1 collaboration with BBinnie Rail MD regarding development and update of comprehensive plan of care as evidenced by provider attestation and co-signature Inter-disciplinary care  team collaboration (see longitudinal plan of care) Comprehensive medication review performed; medication list updated in electronic medical record  Hypertension (BP goal <140/90) -Controlled - BP is at goal in office and at home per patient report; pt endorses compliance; she  reports occasional dry cough with lisinopril but states it is not a problem for her BP Readings from Last 3 Encounters:  01/26/21 104/64  12/31/20 118/79  12/02/20 112/76  -Current treatment: Lisinopril 40 mg daily HCTZ 12.5 mg daily -Medications previously tried: verapamil  -Recommended to continue current medication  Prediabetes (A1c goal <6.5%) -Controlled - medication is for weight loss, PCOS and prediabetes; pt reports initial GI upset when she switched from Victoza to Christine, but side effects mostly resolved -Current medications: Metformin XR 500 mg BID Ozempic 0.5 mg weekly (Wed) -Current exercise: swimming 30 min twice a day -Educated on A1c and blood sugar goals, prediabetes range -Recommended for patient to continue ozempic 0.36m weekly dose as she was unable to tolerate 167mweekly dose in past   Depression/Anxiety (Goal: manage symptoms) -Controlled - pt reports only citalopram is for hx of panic attacks; pt has successfully discontinued both bupropion and topiramate (ineffective for wt loss); -Current treatment: Citalopram 20 mg daily -Recommend to continue current medication  Asthma (Goal: manage symptoms; prevent exacerbations) -Controlled  -Current treatment  Albuterol HFA prn Levocetirizine 5 mg daily -Recommend to continue current medication  Pain (Goal: manage symptoms) -Stable - follow with [facility] - most recently had tried humira, did not tolerate  -Inflammatory polyarthritis; generalized osteoarthritis; cervical disc disorder; fibromyalgia -Current treatment  Celecoxib 200 mg BID Hydroxychloroquine 200 mg BID -Counseled on alternative immunotherapy options including biologics; advised pt to keep appt with rheumatologist to discuss options next month in January  -Recommended to continue current medication  Insomnia (Goal: improve sleep) -Stable -typical bedtime 9pm, trouble falling asleep, waking up every few hours -Medications previously  tried: melatonin, trazodone, Tylenol PM  -Counseled on sleep hygiene; importance of routine sleep schedule; avoiding screens within 1 hr of bedtime  Patient Goals/Self-Care Activities Patient will:  - take medications as prescribed focus on medication adherence by routine target a minimum of 150 minutes of moderate intensity exercise weekly -Avoid screens within 1 hour of bedtime      Medication Assistance: None required.  Patient affirms current coverage meets needs.  Compliance/Adherence/Medication fill history: Care Gaps: COVID booster (due 06/10/20) Shingrix (due 10/18/20) Influenza vaccine   Star-Rating Drugs: Lisinopril - LF 09/15/20 x 90 ds Metformin - LF 10/13/20 x 30 ds  Patient's [facility] pharmacy is:  [id number] [id number]: [phone]: [phone] pill box? No - prefers bottles Pt endorses 100% compliance  We discussed: Current pharmacy is preferred with insurance plan and patient is satisfied with pharmacy services Patient decided to: Continue current medication management strategy  Care Plan and Follow Up Patient Decision:  Patient agrees to Care Plan and Follow-up.  Plan: Telephone follow up appointment with care management team member scheduled for:  3 months  DanTomasa BlaseharmD Clinical Pharmacist, [place]

## 2021-02-17 DIAGNOSIS — M5416 Radiculopathy, lumbar region: Secondary | ICD-10-CM | POA: Diagnosis not present

## 2021-02-22 ENCOUNTER — Other Ambulatory Visit (INDEPENDENT_AMBULATORY_CARE_PROVIDER_SITE_OTHER): Payer: Self-pay | Admitting: Family Medicine

## 2021-02-22 DIAGNOSIS — E8881 Metabolic syndrome: Secondary | ICD-10-CM

## 2021-02-22 NOTE — Telephone Encounter (Signed)
LAST APPOINTMENT DATE: 01/26/21 NEXT APPOINTMENT DATE: 03/17/21   [id number] [address], [place] [address] [place] [address] Phone: [phone] Fax: [phone]  Patient is requesting a refill of the following medications: Requested Prescriptions   Pending Prescriptions Disp Refills   OZEMPIC, 0.25 OR 0.5 MG/DOSE, 2 MG/1.5ML SOPN [Pharmacy Med Name: OZEMPIC 0.25-0.5 MG/DOSE PEN]      Sig: INJECT 0.5 MG INTO THE SKIN ONCE A WEEK. OK TO SLOWLY DIAL UP PEN CLICKS TO THE EQUIVALENT OF .75MG .    Date last filled: 01/26/21 Previously prescribed by DR Leafy Ro Lab Results  Component Value Date   HGBA1C 5.7 (H) 12/31/2020   HGBA1C 5.8 (H) 08/13/2020   HGBA1C 5.9 04/28/2020   Lab Results  Component Value Date   LDLCALC 66 12/31/2020   CREATININE 1.21 (H) 12/31/2020   Lab Results  Component Value Date   VD25OH 45.9 12/31/2020   VD25OH 34.8 08/13/2020   VD25OH 36.7 04/30/2020    BP Readings from Last 3 Encounters:  01/26/21 104/64  12/31/20 118/79  12/02/20 112/76

## 2021-02-27 ENCOUNTER — Other Ambulatory Visit: Payer: Self-pay | Admitting: Internal Medicine

## 2021-02-27 DIAGNOSIS — R7303 Prediabetes: Secondary | ICD-10-CM

## 2021-02-27 DIAGNOSIS — E8881 Metabolic syndrome: Secondary | ICD-10-CM

## 2021-02-27 DIAGNOSIS — J309 Allergic rhinitis, unspecified: Secondary | ICD-10-CM

## 2021-03-01 ENCOUNTER — Encounter: Payer: Self-pay | Admitting: Internal Medicine

## 2021-03-01 DIAGNOSIS — M25511 Pain in right shoulder: Secondary | ICD-10-CM | POA: Diagnosis not present

## 2021-03-01 DIAGNOSIS — M25512 Pain in left shoulder: Secondary | ICD-10-CM | POA: Diagnosis not present

## 2021-03-01 MED ORDER — CITALOPRAM HYDROBROMIDE 20 MG PO TABS: 20.0000 mg | ORAL_TABLET | Freq: Every day | ORAL | 0 refills | Status: DC

## 2021-03-02 ENCOUNTER — Ambulatory Visit (INDEPENDENT_AMBULATORY_CARE_PROVIDER_SITE_OTHER): Payer: PPO | Admitting: Family Medicine

## 2021-03-03 DIAGNOSIS — E785 Hyperlipidemia, unspecified: Secondary | ICD-10-CM

## 2021-03-03 DIAGNOSIS — I10 Essential (primary) hypertension: Secondary | ICD-10-CM | POA: Diagnosis not present

## 2021-03-03 DIAGNOSIS — R7303 Prediabetes: Secondary | ICD-10-CM

## 2021-03-15 DIAGNOSIS — H52223 Regular astigmatism, bilateral: Secondary | ICD-10-CM | POA: Diagnosis not present

## 2021-03-15 DIAGNOSIS — Z79899 Other long term (current) drug therapy: Secondary | ICD-10-CM | POA: Diagnosis not present

## 2021-03-15 DIAGNOSIS — H5213 Myopia, bilateral: Secondary | ICD-10-CM | POA: Diagnosis not present

## 2021-03-17 ENCOUNTER — Encounter (INDEPENDENT_AMBULATORY_CARE_PROVIDER_SITE_OTHER): Payer: Self-pay | Admitting: Family Medicine

## 2021-03-17 ENCOUNTER — Other Ambulatory Visit: Payer: Self-pay

## 2021-03-17 ENCOUNTER — Ambulatory Visit (INDEPENDENT_AMBULATORY_CARE_PROVIDER_SITE_OTHER): Payer: PPO | Admitting: Family Medicine

## 2021-03-17 VITALS — BP 117/75 | HR 77 | Temp 98.7°F | Ht 68.0 in | Wt 225.0 lb

## 2021-03-17 DIAGNOSIS — E1169 Type 2 diabetes mellitus with other specified complication: Secondary | ICD-10-CM | POA: Diagnosis not present

## 2021-03-17 DIAGNOSIS — K581 Irritable bowel syndrome with constipation: Secondary | ICD-10-CM

## 2021-03-17 DIAGNOSIS — Z6836 Body mass index (BMI) 36.0-36.9, adult: Secondary | ICD-10-CM

## 2021-03-17 MED ORDER — OZEMPIC (1 MG/DOSE) 4 MG/3ML ~~LOC~~ SOPN
1.0000 mg | PEN_INJECTOR | SUBCUTANEOUS | 0 refills | Status: DC
Start: 2021-03-17 — End: 2021-04-27

## 2021-03-17 NOTE — Progress Notes (Signed)
Chief Complaint:   OBESITY [name] is here to discuss her progress with her obesity treatment plan along with follow-up of her obesity related diagnoses. [name] is on keeping a food journal and adhering to recommended goals of 1000-1300 calories and 75+ grams of protein daily and states she is following her eating plan approximately 50% of the time. [name] states she is doing 0 minutes 0 times per week.  Today's visit was #: 41 Starting weight: 241 lbs Starting date: 09/28/2016 Today's weight: 225 lbs Today's date: 03/17/2021 Total lbs lost to date: 16 Total lbs lost since last in-office visit: 0  Interim History: [name] has done well maintaining her weight over the holiday season. Her hunger is controlled and she is working on not skipping meals. She has Christmas holiday plans, but she is making strategies to minimize weight gain.  Subjective:   1. Type 2 diabetes mellitus with other specified complication, without long-term current use of insulin (HCC) [name] is on [name], and she notes decreased polyphagia. She notes some constipation.  2. Irritable bowel syndrome with constipation [name] notes intestinal cramping worse with [name] recently.  Assessment/Plan:   1. Type 2 diabetes mellitus with other specified complication, without long-term current use of insulin ([name]) [name] agreed to increase [name] to 1 mg daily, and we will refill for 1 month. Good blood sugar control is important to decrease the likelihood of diabetic complications such as nephropathy, neuropathy, limb loss, blindness, coronary artery disease, and death. Intensive lifestyle modification including diet, exercise and weight loss are the first line of treatment for diabetes.   - Semaglutide, 1 MG/DOSE, ([name], 1 MG/DOSE,) 4 MG/3ML SOPN; Inject 1 mg into the skin every 7 (seven) days.  Dispense: 3 mL; Refill: 0  2. Irritable bowel syndrome with constipation [name] is to continue miralax  1/2-1 capful everyday instead of as needed, and we will follow up at her next visit.  3. Obesity with current BMI of 34.3 [name] is currently in the action stage of change. As such, her goal is to continue with weight loss efforts. She has agreed to following a lower carbohydrate, vegetable and lean protein rich diet plan.   We will recheck fasting labs at her next visit.  Behavioral modification strategies: increasing lean protein intake and increasing water intake.  [name] has agreed to follow-up with our clinic in 4 weeks. She was informed of the importance of frequent follow-up visits to maximize her success with intensive lifestyle modifications for her multiple health conditions.   Objective:   Blood pressure 117/75, pulse 77, temperature 98.7 F (37.1 C), height 5\' 8"  (1.727 m), weight 225 lb (102.1 kg), SpO2 100 %. Body mass index is 34.21 kg/m.  General: Cooperative, alert, well developed, in no acute distress. HEENT: Conjunctivae and lids unremarkable. Cardiovascular: Regular rhythm.  Lungs: Normal work of breathing. Neurologic: No focal deficits.   Lab Results  Component Value Date   CREATININE 1.21 (H) 12/31/2020   BUN 24 12/31/2020   NA 141 12/31/2020   K 4.3 12/31/2020   CL 104 12/31/2020   CO2 20 12/31/2020   Lab Results  Component Value Date   ALT 50 (H) 12/31/2020   AST 34 12/31/2020   ALKPHOS 92 12/31/2020   BILITOT 0.2 12/31/2020   Lab Results  Component Value Date   HGBA1C 5.7 (H) 12/31/2020   HGBA1C 5.8 (H) 08/13/2020   HGBA1C 5.9 04/28/2020   HGBA1C 5.5 11/11/2019   HGBA1C 5.3 07/08/2019   Lab Results  Component Value Date   INSULIN 17.9 12/31/2020   INSULIN 14.2 08/13/2020   INSULIN 25.1 (H) 04/30/2020   INSULIN 16.4 11/11/2019   INSULIN 17.3 02/21/2019   Lab Results  Component Value Date   TSH 2.590 12/31/2020   Lab Results  Component Value Date   CHOL 136 12/31/2020   HDL 53 12/31/2020   LDLCALC 66 12/31/2020   TRIG 89  12/31/2020   CHOLHDL 3 08/16/2016   Lab Results  Component Value Date   VD25OH 45.9 12/31/2020   VD25OH 34.8 08/13/2020   VD25OH 36.7 04/30/2020   Lab Results  Component Value Date   WBC 6.7 08/13/2020   HGB 13.0 08/13/2020   HCT 38.8 08/13/2020   MCV 84 08/13/2020   PLT 266 08/13/2020   No results found for: IRON, TIBC, FERRITIN  Obesity Behavioral Intervention:   Approximately 15 minutes were spent on the discussion below.  ASK: We discussed the diagnosis of obesity with [name] today and [name] agreed to give [place] permission to discuss obesity behavioral modification therapy today.  ASSESS: [name] has the diagnosis of obesity and her BMI today is 34.3. [name] is in the action stage of change.   ADVISE: [name] was educated on the multiple health risks of obesity as well as the benefit of weight loss to improve her health. She was advised of the need for long term treatment and the importance of lifestyle modifications to improve her current health and to decrease her risk of future health problems.  AGREE: Multiple dietary modification options and treatment options were discussed and [name] agreed to follow the recommendations documented in the above note.  ARRANGE: [name] was educated on the importance of frequent visits to treat obesity as outlined per CMS and USPSTF guidelines and agreed to schedule her next follow up appointment today.  Attestation Statements:   Reviewed by clinician on day of visit: allergies, medications, problem list, medical history, surgical history, family history, social history, and previous encounter notes.   I, Trixie Dredge, am acting as transcriptionist for Dennard Nip, MD.  I have reviewed the above documentation for accuracy and completeness, and I agree with the above. -  Dennard Nip, MD

## 2021-03-20 ENCOUNTER — Other Ambulatory Visit (INDEPENDENT_AMBULATORY_CARE_PROVIDER_SITE_OTHER): Payer: Self-pay | Admitting: Family Medicine

## 2021-03-20 DIAGNOSIS — E8881 Metabolic syndrome: Secondary | ICD-10-CM

## 2021-03-22 NOTE — Telephone Encounter (Signed)
Pt last seen by Dr. Beasley.  

## 2021-03-22 NOTE — Telephone Encounter (Signed)
LAST APPOINTMENT DATE: 03/17/21 NEXT APPOINTMENT DATE: 04/27/20   [id number] - [address], [place] - [address] [place] [address] Phone: [phone] Fax: [phone]  Patient is requesting a refill of the following medications: Requested Prescriptions   Pending Prescriptions Disp Refills   Semaglutide, 1 MG/DOSE, 4 MG/3ML SOPN [Pharmacy Med Name: OZEMPIC 0.25-0.5 MG/DOSE PEN] 3 mL     Sig: Inject 1 mg as directed once a week.    Date last filled: 03/17/21 Previously prescribed by Dr. Leafy Ro  Lab Results  Component Value Date   HGBA1C 5.7 (H) 12/31/2020   HGBA1C 5.8 (H) 08/13/2020   HGBA1C 5.9 04/28/2020   Lab Results  Component Value Date   LDLCALC 66 12/31/2020   CREATININE 1.21 (H) 12/31/2020   Lab Results  Component Value Date   VD25OH 45.9 12/31/2020   VD25OH 34.8 08/13/2020   VD25OH 36.7 04/30/2020    BP Readings from Last 3 Encounters:  03/17/21 117/75  01/26/21 104/64  12/31/20 118/79

## 2021-03-29 ENCOUNTER — Other Ambulatory Visit (INDEPENDENT_AMBULATORY_CARE_PROVIDER_SITE_OTHER): Payer: Self-pay | Admitting: Family Medicine

## 2021-03-29 DIAGNOSIS — E8881 Metabolic syndrome: Secondary | ICD-10-CM

## 2021-04-07 DIAGNOSIS — M5416 Radiculopathy, lumbar region: Secondary | ICD-10-CM | POA: Diagnosis not present

## 2021-04-07 DIAGNOSIS — M545 Low back pain, unspecified: Secondary | ICD-10-CM | POA: Diagnosis not present

## 2021-04-20 DIAGNOSIS — L4050 Arthropathic psoriasis, unspecified: Secondary | ICD-10-CM | POA: Diagnosis not present

## 2021-04-20 DIAGNOSIS — Z7960 Long term (current) use of unspecified immunomodulators and immunosuppressants: Secondary | ICD-10-CM | POA: Diagnosis not present

## 2021-04-20 DIAGNOSIS — L409 Psoriasis, unspecified: Secondary | ICD-10-CM | POA: Diagnosis not present

## 2021-04-20 DIAGNOSIS — M159 Polyosteoarthritis, unspecified: Secondary | ICD-10-CM | POA: Diagnosis not present

## 2021-04-25 ENCOUNTER — Encounter: Payer: Self-pay | Admitting: Internal Medicine

## 2021-04-25 NOTE — Progress Notes (Signed)
She is     Subjective:    Patient ID: [name], female    DOB: [birth year], 51 y.o.   MRN: [id number]  This visit occurred during the SARS-CoV-2 public health emergency.  Safety protocols were in place, including screening questions prior to the visit, additional usage of staff PPE, and extensive cleaning of exam room while observing appropriate contact time as indicated for disinfecting solutions.     HPI The patient is here for follow up of their chronic medical problems, including htn, prediabetes,  GERD, anxiety/depression, asthma  She is currently at the [facility] and is working on weight loss.  She is doing well with all of her current medications.  She is having increased constipation - she started miralax daily today.    She is having increased stress.  She is not sleeping - she takes two tylenol pm at night and a gummy at night.    Medications and allergies reviewed with patient and updated if appropriate.  Patient Active Problem List   Diagnosis Date Noted   Non-recurrent unilateral inguinal hernia without obstruction or gangrene 11/13/2020   Psoriatic arthritis ([place]) - Dr Posey Pronto, [facility] 08/25/2020   Generalized osteoarthritis 08/25/2020   Insulin resistance 12/30/2019   Chronic back pain 12/24/2019   Dry cough 11/21/2017   Anxiety 08/17/2017   Hyperhidrosis 08/17/2017   Depression 23/53/6144   Umbilical hernia 31/54/0086   Diverticulosis of colon 02/26/2017   Other hyperlipidemia 02/06/2017   Asthma 11/24/2016   Vitamin D deficiency 11/09/2016   Class 1 obesity with serious comorbidity and body mass index (BMI) of 33.0 to 33.9 in adult 11/09/2016   Right shoulder pain 10/11/2016   AC (acromioclavicular) joint arthritis 09/13/2016   Postsurgical menopause 08/16/2016   Mucoid cyst of joint 11/04/2015   External hemorrhoid 08/20/2015   Prediabetes 06/05/2015   GERD (gastroesophageal reflux disease) 06/04/2015   Status post bilateral  breast reduction 04/01/2015   Cervical disc disorder with radiculopathy of cervical region 05/23/2014   Ulnar neuropathy 01/16/2014   Fibromyalgia 01/16/2014   Recurrent nephrolithiasis 04/29/2013   Arthralgia 04/29/2013   Panic attacks 01/15/2013   IBS (irritable bowel syndrome) 12/14/2011   B12 deficiency 06/30/2009   Essential hypertension 03/02/2007    Current Outpatient Medications on File Prior to Visit  Medication Sig Dispense Refill   albuterol (VENTOLIN HFA) 108 (90 Base) MCG/ACT inhaler TAKE 2 PUFFS BY MOUTH EVERY 6 HOURS AS NEEDED FOR WHEEZE OR SHORTNESS OF BREATH (Patient taking differently: Inhale 2 puffs into the lungs every 6 (six) hours as needed for shortness of breath or wheezing.) 8.5 g 5   Blood Glucose Monitoring Suppl (ONE TOUCH ULTRA 2) w/Device KIT OneTouch Ultra2 Meter     celecoxib (CELEBREX) 200 MG capsule Take 200 mg by mouth 2 (two) times daily.     diphenhydramine-acetaminophen (TYLENOL PM) 25-500 MG TABS tablet Take 2 tablets by mouth at bedtime.     famotidine (PEPCID) 20 MG tablet TAKE 1 TABLET BY MOUTH EVERYDAY AT BEDTIME 90 tablet 1   glucose blood (ONETOUCH ULTRA) test strip TEST BLOOD SUGARS ONCE A DAY 100 strip 0   hydrochlorothiazide (MICROZIDE) 12.5 MG capsule TAKE 1 CAPSULE BY MOUTH EVERY DAY 90 capsule 1   hydroxychloroquine (PLAQUENIL) 200 MG tablet Take 200 mg by mouth 2 (two) times daily.     Insulin Pen Needle (BD PEN NEEDLE NANO U/F) 32G X 4 MM MISC 1 each by Other route daily. 100 each 0  levocetirizine (XYZAL) 5 MG tablet TAKE 1 TABLET BY MOUTH EVERY DAY IN THE EVENING 90 tablet 1   lisinopril (ZESTRIL) 40 MG tablet TAKE 1 TABLET BY MOUTH EVERY DAY 90 tablet 1   metFORMIN (GLUCOPHAGE-XR) 500 MG 24 hr tablet TAKE 1 TABLET (500 MG TOTAL) BY MOUTH 2 (TWO) TIMES DAILY BEFORE A MEAL. 180 tablet 0   OneTouch Delica Lancets 76K MISC 1 each by Does not apply route 2 (two) times daily. 100 each 0   polyethylene glycol powder (GLYCOLAX/MIRALAX) 17  GM/SCOOP powder Take 17 g by mouth daily. 3350 g 1   Semaglutide, 1 MG/DOSE, (OZEMPIC, 1 MG/DOSE,) 4 MG/3ML SOPN Inject 1 mg into the skin every 7 (seven) days. 3 mL 0   No current facility-administered medications on file prior to visit.    Past Medical History:  Diagnosis Date   Anemia    Anxiety    Arthritis    inflammitory   Asthma    B12 deficiency    Back pain    Chronic joint pain    Constipation    DDD (degenerative disc disease), lumbar    low back and neck and shoulders   Depression    during divorce & legal matters   Fatty liver    Fibromyalgia    Gallbladder problem    GERD (gastroesophageal reflux disease)    History of kidney stones    Dr Phebe Colla   History of polycystic ovarian disease S/P BSO   Hx of anxiety disorder    Hypertension    a   Hypothyroidism    no meds   IBS (irritable bowel syndrome)    IBS (irritable bowel syndrome)    Infertility, female    Joint pain    Kidney problem    Lactose intolerance    Lupus (HCC) hx positive ANA   followed by Dr Gavin Pound; possible lupus   Multiple food allergies    Myalgia    Osteoarthritis    Polyarthritis, 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[place])    POLYCYSTIC OVARIAN DISEASE 03/02/2007   Qualifier: Diagnosis of  By: Linna Darner MD, Rae Mar BSO for cysts & endometriosis; Dr Reino Kent, Gyn Seeing Dr Paula Compton     PONV (postoperative nausea and vomiting)    Pre-diabetes    Prediabetes    Sweating profusely    Symptomatic mammary hypertrophy    URI (upper respiratory infection)    currently on cefdinir    Past Surgical History:  Procedure Laterality Date   ABDOMINAL HYSTERECTOMY     [place]   exploratory lap   BACK SURGERY     BREAST REDUCTION SURGERY Bilateral 03/26/2015   Procedure: BILATERAL BREAST REDUCTION ;  Surgeon: Wallace Going, DO;  Location: [facility];  Service: Plastics;  Laterality: Bilateral;   CARPAL TUNNEL RELEASE     bilateral; Dr  Sherwood Gambler   COLONOSCOPY     in [place]  07-09-2004   HX BILATERAL RENAL STONES/ RIGHT FLANK PAIN   CYSTOSCOPY W/ URETERAL STENT PLACEMENT  11/12/2011   Procedure: CYSTOSCOPY WITH RETROGRADE PYELOGRAM/URETERAL STENT PLACEMENT;  Surgeon: Alexis Frock, MD;  Location: [facility];  Service: Urology;  Laterality: Right;   CYSTOSCOPY W/ URETERAL STENT REMOVAL  12/07/2011   Procedure: CYSTOSCOPY WITH STENT REMOVAL;  Surgeon: Alexis Frock, MD;  Location: [facility];  Service: Urology;  Laterality: Right;   CYSTOSCOPY/RETROGRADE/URETEROSCOPY/STONE EXTRACTION  WITH BASKET  12/07/2011   Procedure: CYSTOSCOPY/RETROGRADE/URETEROSCOPY/STONE EXTRACTION WITH BASKET;  Surgeon: Alexis Frock, MD;  Location: [facility];  Service: Urology;  Laterality: Right;   DILATION AND CURETTAGE OF UTERUS     X5   EXCISION LEFT BARTHOLIN GLAND  02-05-2002   EYE SURGERY     Retinal tears surgery bilateral   LAPAROSCOPIC ASSISTED VAGINAL HYSTERECTOMY  2000   LAPAROSCOPIC CHOLECYSTECTOMY  05-31-2007   LAPAROSCOPIC LASER ABLATION ENDOMETRIOSIS AND LEFT SALPINGO-OOPHORECTOMY  11-03-1999   LAPAROSCOPY WITH RIGHT SALPINGO-OOPHECTOMY/ LYSIS ADHESIONS AND ABLATION ENDOMETRIOSIS  05-17-2001   LIPOSUCTION Bilateral 03/26/2015   Procedure: WITH LIPOSUCTION;  Surgeon: Wallace Going, DO;  Location: [facility];  Service: Plastics;  Laterality: Bilateral;   LUMBAR LAMINECTOMY  2003   L4 - L5   NECK SURGERY  2017   PLANTAR FASCIA SURGERY     right   PLANTAR FASCIA SURGERY  02/2011   left   REDUCTION MAMMAPLASTY     RIGHT URETEROSCOPIC STONE EXTRACTION  08-04-2000   SHOULDER ARTHROSCOPY WITH ROTATOR CUFF REPAIR Right 05/11/2020   Procedure: SHOULDER ARTHROSCOPY WITH ROTATOR CUFF REPAIR;  Surgeon: Lovell Sheehan, MD;  Location: [facility];  Service: Orthopedics;  Laterality: Right;   TONSILLECTOMY     URETERAL STENT PLACEMENT  11/12/11   Dr Tresa Moore     Social History   Socioeconomic History   Marital status: Married    Spouse name: Darnelle Maffucci   Number of children: 0   Years of education: college   Highest education level: Not on file  Occupational History   Occupation: disabled  Tobacco Use   Smoking status: Never   Smokeless tobacco: Never  Substance and Sexual Activity   Alcohol use: Yes    Alcohol/week: 1.0 standard drink    Types: 1 Shots of liquor per week    Comment:  very rarely   Drug use: No   Sexual activity: Yes    Birth control/protection: Surgical  Other Topics Concern   Not on file  Social History Narrative   Patient Lives at home with her husband Darnelle Maffucci)   Disabled.   Education two years of college.   Right handed.   Caffeine coffee and sweet tea. Not daily.   Social Determinants of Health   Financial Resource Strain: Low Risk    Difficulty of Paying Living Expenses: Not hard at all  Food Insecurity: No Food Insecurity   Worried About Charity fundraiser in the Last Year: Never true   [place] in the Last Year: Never true  Transportation Needs: No Transportation Needs   Lack of Transportation (Medical): No   Lack of Transportation (Non-Medical): No  Physical Activity: Sufficiently Active   Days of Exercise per Week: 7 days   Minutes of Exercise per Session: 30 min  Stress: No Stress Concern Present   Feeling of Stress : Not at all  Social Connections: Unknown   Frequency of Communication with Friends and Family: More than three times a week   Frequency of Social Gatherings with Friends and Family: More than three times a week   Attends Religious Services: Patient refused   Active Member of Clubs or Organizations: Patient refused   Attends Archivist Meetings: Patient refused   Marital Status: Married    Family History  Problem Relation Age of Onset   Hypertension Mother    Colon polyps Mother    Atrial fibrillation Mother  Dr Angelena Form   Hyperlipidemia Mother     Heart disease Mother    Anxiety disorder Mother    Sleep apnea Mother    Obesity Mother    Heart attack Father 35   Diabetes Father    Hypertension Father    Hyperlipidemia Father    Heart disease Father    Stroke Father    Obesity Father    Hypertension Sister    Heart attack Paternal Grandmother 49   Breast cancer Paternal Aunt 66   Colon cancer Maternal Uncle    Colon cancer Paternal Uncle    Colon polyps Maternal Grandmother    Stroke Maternal Grandmother 76   Diabetes Paternal Grandfather    Stroke Maternal Grandfather 37    Review of Systems  Constitutional:  Negative for fever.  Respiratory:  Negative for cough, shortness of breath and wheezing.   Cardiovascular:  Negative for chest pain and palpitations (occ).  Neurological:  Positive for light-headedness (sinus related) and headaches (sinus).  Psychiatric/Behavioral:  Positive for dysphoric mood and sleep disturbance. The patient is nervous/anxious.       Objective:   Vitals:   04/28/21 1259  BP: 120/76  Pulse: 100  Temp: 98.6 F (37 C)  SpO2: 98%   BP Readings from Last 3 Encounters:  04/28/21 120/76  04/27/21 132/77  03/17/21 117/75   Wt Readings from Last 3 Encounters:  04/28/21 227 lb (103 kg)  04/27/21 227 lb (103 kg)  03/17/21 225 lb (102.1 kg)   Body mass index is 34.52 kg/m.   Depression screen [facility] 2/9 04/28/2021 10/26/2020 10/26/2020 04/22/2020 12/24/2019  Decreased Interest 0 0 0 0 0  Down, Depressed, Hopeless 0 0 0 0 0  PHQ - 2 Score 0 0 0 0 0  Altered sleeping 3 1 - 0 3  Tired, decreased energy 1 1 - 1 3  Change in appetite 0 0 - 0 0  Feeling bad or failure about yourself  0 0 - 0 0  Trouble concentrating 0 0 - 0 0  Moving slowly or fidgety/restless 0 0 - 0 0  Suicidal thoughts 0 0 - 0 0  PHQ-9 Score 4 2 - 1 6  Difficult doing work/chores Not difficult at all Not difficult at all - Not difficult at all -  Some recent data might be hidden    GAD 7 : Generalized Anxiety Score  04/28/2021 10/26/2020  Nervous, Anxious, on [place] 0 0  Control/stop worrying 1 0  Worry too much - different things 0 0  Trouble relaxing 0 0  Restless 0 0  Easily annoyed or irritable 0 0  Afraid - awful might happen 0 0  Total GAD 7 Score 1 0  Anxiety Difficulty Not difficult at all -      Physical Exam    Constitutional: Appears well-developed and well-nourished. No distress.  HENT:  Head: Normocephalic and atraumatic.  Neck: Neck supple. No tracheal deviation present. No thyromegaly present.  No cervical lymphadenopathy Cardiovascular: Normal rate, regular rhythm and normal heart sounds.   No murmur heard. No carotid bruit .  No edema Pulmonary/Chest: Effort normal and breath sounds normal. No respiratory distress. No has no wheezes. No rales.  Skin: Skin is warm and dry. Not diaphoretic.  Psychiatric: Normal mood and affect. Behavior is normal.      Assessment & Plan:    See Problem List for Assessment and Plan of chronic medical problems.

## 2021-04-25 NOTE — Patient Instructions (Addendum)
° ° ° °  Medications changes include :   start vitamin d3 2000 units daily.  Stop celexa and start cymbalta 30 mg daily  Your prescription(s) have been submitted to your pharmacy. Please take as directed and contact our office if you believe you are having problem(s) with the medication(s).     Please followup in 6 months

## 2021-04-27 ENCOUNTER — Other Ambulatory Visit: Payer: Self-pay

## 2021-04-27 ENCOUNTER — Ambulatory Visit (INDEPENDENT_AMBULATORY_CARE_PROVIDER_SITE_OTHER): Payer: PPO | Admitting: Family Medicine

## 2021-04-27 ENCOUNTER — Encounter (INDEPENDENT_AMBULATORY_CARE_PROVIDER_SITE_OTHER): Payer: Self-pay | Admitting: Family Medicine

## 2021-04-27 VITALS — BP 132/77 | HR 64 | Temp 98.9°F | Ht 68.0 in | Wt 227.0 lb

## 2021-04-27 DIAGNOSIS — Z6834 Body mass index (BMI) 34.0-34.9, adult: Secondary | ICD-10-CM

## 2021-04-27 DIAGNOSIS — K5909 Other constipation: Secondary | ICD-10-CM

## 2021-04-27 DIAGNOSIS — E669 Obesity, unspecified: Secondary | ICD-10-CM | POA: Diagnosis not present

## 2021-04-27 DIAGNOSIS — R7303 Prediabetes: Secondary | ICD-10-CM

## 2021-04-27 DIAGNOSIS — E559 Vitamin D deficiency, unspecified: Secondary | ICD-10-CM | POA: Diagnosis not present

## 2021-04-27 MED ORDER — POLYETHYLENE GLYCOL 3350 17 GM/SCOOP PO POWD
17.0000 g | Freq: Every day | ORAL | 1 refills | Status: DC
Start: 2021-04-27 — End: 2021-06-29

## 2021-04-27 MED ORDER — OZEMPIC (1 MG/DOSE) 4 MG/3ML ~~LOC~~ SOPN: 1.0000 mg | PEN_INJECTOR | SUBCUTANEOUS | 0 refills | Status: DC

## 2021-04-27 NOTE — Progress Notes (Signed)
° ° ° °Chief Complaint:  ° °OBESITY °[name] is here to discuss her progress with her obesity treatment plan along with follow-up of her obesity related diagnoses. [name] is on following a lower carbohydrate, vegetable and lean protein rich diet plan and states she is following her eating plan approximately 50% of the time. [name] states she is doing 0 minutes 0 times per week. ° °Today's visit was #: 65 °Starting weight: 241 lbs °Starting date: 09/28/2016 °Today's weight: 227 lbs °Today's date: 04/27/2021 °Total lbs lost to date: 14 °Total lbs lost since last in-office visit: 0 ° °Interim History: [name] did well with minimizing weight gain over the holidays. She is on Ozempic, but she was also on steroids for a month. She has been mindful of her food choices, and she is journaling intermittently. ° °Subjective:  ° °1. Other constipation °[name] notes decreased BM frequency and increase in abdominal cramping. She has a history of IBS. She has taken miralax intermittently but not regularly in the past.  ° °2. Pre-diabetes °[name] continues to work on diet and exercise. She is still struggling with weight loss.  ° °3. Vitamin D deficiency °[name] is on Vit D, and she is due to have labs checked. No side effects were noted. ° °Assessment/Plan:  ° °1. Other constipation °Barbra agreed to start miralax 17 grams daily with 1 refill, and she is to increase her water intake. She was informed that a decrease in bowel movement frequency is normal while losing weight, but stools should not be hard or painful. Orders and follow up as documented in patient record.  ° °- polyethylene glycol powder (GLYCOLAX/MIRALAX) 17 GM/SCOOP powder; Take 17 g by mouth daily.  Dispense: 3350 g; Refill: 1 ° °2. Pre-diabetes °We will check labs today. Hazley agreed to increase Ozempic to 1 mg, and we will refill for 1 month. She will continue to work on weight loss, exercise, and decreasing simple carbohydrates to help decrease  the risk of diabetes.  ° °- CMP14+EGFR °- Insulin, random °- Hemoglobin A1c °- Semaglutide, 1 MG/DOSE, (OZEMPIC, 1 MG/DOSE,) 4 MG/3ML SOPN; Inject 1 mg into the skin every 7 (seven) days.  Dispense: 3 mL; Refill: 0 ° °3. Vitamin D deficiency °Low Vitamin D level contributes to fatigue and are associated with obesity, breast, and colon cancer. We will check labs today. [name] will follow-up for routine testing of Vitamin D, at least 2-3 times per year to avoid over-replacement. ° °- VITAMIN D 25 Hydroxy (Vit-D Deficiency, Fractures) ° °4. Obesity with current BMI of 34.6 °[name] is currently in the action stage of change. As such, her goal is to continue with weight loss efforts. She has agreed to keeping a food journal and adhering to recommended goals of 1200 calories and 75+ grams of protein daily.  ° °Behavioral modification strategies: no skipping meals. ° °[name] has agreed to follow-up with our clinic in 4 weeks. She was informed of the importance of frequent follow-up visits to maximize her success with intensive lifestyle modifications for her multiple health conditions.  ° °[name] was informed we would discuss her lab results at her next visit unless there is a critical issue that needs to be addressed sooner. [name] agreed to keep her next visit at the agreed upon time to discuss these results. ° °Objective:  ° °Blood pressure 132/77, pulse 64, temperature 98.9 °F (37.2 °C), height 5' 8" (1.727 m), weight 227 lb (103 kg), SpO2 99 %. °Body mass index is 34.52 kg/m². ° °General:   Cooperative, alert, well developed, in no acute distress. °HEENT: Conjunctivae and lids unremarkable. °Cardiovascular: Regular rhythm.  °Lungs: Normal work of breathing. °Neurologic: No focal deficits.  ° °Lab Results  °Component Value Date  ° CREATININE 1.21 (H) 12/31/2020  ° BUN 24 12/31/2020  ° NA 141 12/31/2020  ° K 4.3 12/31/2020  ° CL 104 12/31/2020  ° CO2 20 12/31/2020  ° °Lab Results  °Component Value Date  ° ALT 50  (H) 12/31/2020  ° AST 34 12/31/2020  ° ALKPHOS 92 12/31/2020  ° BILITOT 0.2 12/31/2020  ° °Lab Results  °Component Value Date  ° HGBA1C 5.7 (H) 12/31/2020  ° HGBA1C 5.8 (H) 08/13/2020  ° HGBA1C 5.9 04/28/2020  ° HGBA1C 5.5 11/11/2019  ° HGBA1C 5.3 07/08/2019  ° °Lab Results  °Component Value Date  ° INSULIN 17.9 12/31/2020  ° INSULIN 14.2 08/13/2020  ° INSULIN 25.1 (H) 04/30/2020  ° INSULIN 16.4 11/11/2019  ° INSULIN 17.3 02/21/2019  ° °Lab Results  °Component Value Date  ° TSH 2.590 12/31/2020  ° °Lab Results  °Component Value Date  ° CHOL 136 12/31/2020  ° HDL 53 12/31/2020  ° LDLCALC 66 12/31/2020  ° TRIG 89 12/31/2020  ° CHOLHDL 3 08/16/2016  ° °Lab Results  °Component Value Date  ° VD25OH 45.9 12/31/2020  ° VD25OH 34.8 08/13/2020  ° VD25OH 36.7 04/30/2020  ° °Lab Results  °Component Value Date  ° WBC 6.7 08/13/2020  ° HGB 13.0 08/13/2020  ° HCT 38.8 08/13/2020  ° MCV 84 08/13/2020  ° PLT 266 08/13/2020  ° °No results found for: IRON, TIBC, FERRITIN ° °Obesity Behavioral Intervention:  ° °Approximately 15 minutes were spent on the discussion below. ° °ASK: °We discussed the diagnosis of obesity with [name] today and [name] agreed to give us permission to discuss obesity behavioral modification therapy today. ° °ASSESS: °[name] has the diagnosis of obesity and her BMI today is 34.6. Frances is in the action stage of change.  ° °ADVISE: °Miller was educated on the multiple health risks of obesity as well as the benefit of weight loss to improve her health. She was advised of the need for long term treatment and the importance of lifestyle modifications to improve her current health and to decrease her risk of future health problems. ° °AGREE: °Multiple dietary modification options and treatment options were discussed and [name] agreed to follow the recommendations documented in the above note. ° °ARRANGE: °[name] was educated on the importance of frequent visits to treat obesity as outlined per CMS  and USPSTF guidelines and agreed to schedule her next follow up appointment today. ° °Attestation Statements:  ° °Reviewed by clinician on day of visit: allergies, medications, problem list, medical history, surgical history, family history, social history, and previous encounter notes. ° ° °I, Sharon Martin, am acting as transcriptionist for Caren Beasley, MD. ° °I have reviewed the above documentation for accuracy and completeness, and I agree with the above. -  Caren Beasley, MD ° ° °

## 2021-04-28 ENCOUNTER — Ambulatory Visit (INDEPENDENT_AMBULATORY_CARE_PROVIDER_SITE_OTHER): Payer: PPO | Admitting: Internal Medicine

## 2021-04-28 ENCOUNTER — Other Ambulatory Visit (INDEPENDENT_AMBULATORY_CARE_PROVIDER_SITE_OTHER): Payer: Self-pay | Admitting: Family Medicine

## 2021-04-28 VITALS — BP 120/76 | HR 100 | Temp 98.6°F | Ht 68.0 in | Wt 227.0 lb

## 2021-04-28 DIAGNOSIS — K219 Gastro-esophageal reflux disease without esophagitis: Secondary | ICD-10-CM

## 2021-04-28 DIAGNOSIS — J4520 Mild intermittent asthma, uncomplicated: Secondary | ICD-10-CM

## 2021-04-28 DIAGNOSIS — K581 Irritable bowel syndrome with constipation: Secondary | ICD-10-CM

## 2021-04-28 DIAGNOSIS — I10 Essential (primary) hypertension: Secondary | ICD-10-CM | POA: Diagnosis not present

## 2021-04-28 DIAGNOSIS — F419 Anxiety disorder, unspecified: Secondary | ICD-10-CM | POA: Diagnosis not present

## 2021-04-28 DIAGNOSIS — F3289 Other specified depressive episodes: Secondary | ICD-10-CM

## 2021-04-28 DIAGNOSIS — G4709 Other insomnia: Secondary | ICD-10-CM

## 2021-04-28 DIAGNOSIS — R7303 Prediabetes: Secondary | ICD-10-CM | POA: Diagnosis not present

## 2021-04-28 DIAGNOSIS — L4050 Arthropathic psoriasis, unspecified: Secondary | ICD-10-CM

## 2021-04-28 DIAGNOSIS — E8881 Metabolic syndrome: Secondary | ICD-10-CM

## 2021-04-28 MED ORDER — DULOXETINE HCL 30 MG PO CPEP: 30.0000 mg | ORAL_CAPSULE | Freq: Every day | ORAL | 5 refills | Status: DC

## 2021-04-28 NOTE — Assessment & Plan Note (Signed)
Chronic Mild, persistent Albuterol inhaler prn

## 2021-04-28 NOTE — Assessment & Plan Note (Addendum)
Chronic BP well controlled Continue hctz 12.5 mg daily, lisinopril 40 mg daily

## 2021-04-28 NOTE — Assessment & Plan Note (Addendum)
Chronic Lab Results  Component Value Date   HGBA1C 5.5 04/27/2021    Low sugar / carb diet Stressed regular exercise Taking Ozempic 1 mg weekly, metformin XR 500 mg twice daily

## 2021-04-28 NOTE — Assessment & Plan Note (Addendum)
Chronic Controlled, Stable - PHQ9 score is 4 Anxiety/stress not controlled so will adjust medication Stop citalopram 20 mg daily Start cymbalta 30 mg daily

## 2021-04-28 NOTE — Assessment & Plan Note (Addendum)
Chronic Was taking tylenol PM and a gummy - no longer working Thinks some of her problems sleeping is related to her chronic pain Will be starting a new medication if improved for her psoriatic arthritis, which may help We will be switching her SSRI to Cymbalta, which may also help Discussed the importance of sleep, but concerns with side effects of sleep medications-we will try to avoid medication for now if we can Discussed good sleep hygiene If she still cannot sleep we can consider medication

## 2021-04-28 NOTE — Assessment & Plan Note (Addendum)
Chronic Has a lot of stress Anxiety score with GAD7 scoring is low - 1, but feels stressed/anxous Stop citalopram 20 mg daily Start cymbalta 30 mg daily

## 2021-04-28 NOTE — Assessment & Plan Note (Signed)
Chronic GERD controlled Continue pepcid 20 mg daily  

## 2021-04-28 NOTE — Assessment & Plan Note (Addendum)
Chronic Following with Dr. Posey Pronto at [facility] On Plaquenil 200 mg twice daily and Celebrex 200 mg twice daily Will be starting [name] if approved

## 2021-04-28 NOTE — Assessment & Plan Note (Signed)
Chronic constipation Worse with ozempic Started miralax daily today - will see if that helps

## 2021-04-30 ENCOUNTER — Encounter: Payer: Self-pay | Admitting: Internal Medicine

## 2021-04-30 LAB — HEMOGLOBIN A1C
Est. average glucose Bld gHb Est-mCnc: 111 mg/dL
Hgb A1c MFr Bld: 5.5 % (ref 4.8–5.6)

## 2021-04-30 LAB — CMP14+EGFR
ALT: 17 IU/L (ref 0–32)
AST: 14 IU/L (ref 0–40)
Albumin/Globulin Ratio: 1.7 (ref 1.2–2.2)
Albumin: 4.2 g/dL (ref 3.8–4.9)
Alkaline Phosphatase: 82 IU/L (ref 44–121)
BUN/Creatinine Ratio: 31 — ABNORMAL HIGH (ref 9–23)
BUN: 27 mg/dL — ABNORMAL HIGH (ref 6–24)
Bilirubin Total: <0.2 mg/dL (ref 0.0–1.2)
CO2: 19 mmol/L — ABNORMAL LOW (ref 20–29)
Calcium: 9.4 mg/dL (ref 8.7–10.2)
Chloride: 104 mmol/L (ref 96–106)
Creatinine, Ser: 0.88 mg/dL (ref 0.57–1.00)
Globulin, Total: 2.5 g/dL (ref 1.5–4.5)
Glucose: 79 mg/dL (ref 70–99)
Potassium: 4.6 mmol/L (ref 3.5–5.2)
Sodium: 143 mmol/L (ref 134–144)
Total Protein: 6.7 g/dL (ref 6.0–8.5)
eGFR: 80 mL/min/{1.73_m2} (ref >59)

## 2021-04-30 LAB — VITAMIN D 25 HYDROXY (VIT D DEFICIENCY, FRACTURES): Vit D, 25-Hydroxy: 35.1 ng/mL (ref 30.0–100.0)

## 2021-04-30 LAB — INSULIN, RANDOM: INSULIN: 28.5 u[IU]/mL — ABNORMAL HIGH (ref 2.6–24.9)

## 2021-05-04 DIAGNOSIS — M75101 Unspecified rotator cuff tear or rupture of right shoulder, not specified as traumatic: Secondary | ICD-10-CM | POA: Diagnosis not present

## 2021-05-17 DIAGNOSIS — M75101 Unspecified rotator cuff tear or rupture of right shoulder, not specified as traumatic: Secondary | ICD-10-CM | POA: Diagnosis not present

## 2021-05-19 ENCOUNTER — Telehealth: Payer: PPO

## 2021-05-20 ENCOUNTER — Other Ambulatory Visit: Payer: Self-pay | Admitting: Internal Medicine

## 2021-05-21 DIAGNOSIS — M25552 Pain in left hip: Secondary | ICD-10-CM | POA: Diagnosis not present

## 2021-05-26 ENCOUNTER — Ambulatory Visit: Payer: PPO | Admitting: Cardiovascular Disease

## 2021-05-26 ENCOUNTER — Encounter: Payer: Self-pay | Admitting: Cardiovascular Disease

## 2021-05-26 ENCOUNTER — Other Ambulatory Visit: Payer: Self-pay

## 2021-05-26 VITALS — BP 126/74 | HR 77 | Ht 68.0 in | Wt 241.8 lb

## 2021-05-26 DIAGNOSIS — R0609 Other forms of dyspnea: Secondary | ICD-10-CM

## 2021-05-26 LAB — BASIC METABOLIC PANEL
BUN/Creatinine Ratio: 27 — ABNORMAL HIGH (ref 9–23)
BUN: 25 mg/dL — ABNORMAL HIGH (ref 6–24)
CO2: 25 mmol/L (ref 20–29)
Calcium: 9.2 mg/dL (ref 8.7–10.2)
Chloride: 107 mmol/L — ABNORMAL HIGH (ref 96–106)
Creatinine, Ser: 0.91 mg/dL (ref 0.57–1.00)
Glucose: 95 mg/dL (ref 70–99)
Potassium: 4.4 mmol/L (ref 3.5–5.2)
Sodium: 142 mmol/L (ref 134–144)
eGFR: 76 mL/min/{1.73_m2} (ref >59)

## 2021-05-26 MED ORDER — METOPROLOL TARTRATE 100 MG PO TABS: 100.0000 mg | ORAL_TABLET | Freq: Once | ORAL | 0 refills | Status: DC

## 2021-05-26 MED ORDER — DIPHENHYDRAMINE HCL 50 MG PO CAPS: ORAL_CAPSULE | ORAL | 0 refills | Status: DC

## 2021-05-26 MED ORDER — PREDNISONE 50 MG PO TABS: ORAL_TABLET | ORAL | 0 refills | Status: DC

## 2021-05-26 NOTE — Patient Instructions (Addendum)
Medication Instructions:  Your physician recommends that you continue on your current medications as directed. Please refer to the Current Medication list given to you today.  *If you need a refill on your cardiac medications before your next appointment, please call your pharmacy*   Lab Work TODAY: BMET   Testing/Procedures: Your physician has recommended for you to have a coronary CTA. See instructions below.  Your physician has requested that you have an echocardiogram. Echocardiography is a painless test that uses sound waves to create images of your heart. It provides your doctor with information about the size and shape of your heart and how well your hearts chambers and valves are working. This procedure takes approximately one hour. There are no restrictions for this procedure.    Follow-Up: At [facility], you and your health needs are our priority.  As part of our continuing mission to provide you with exceptional heart care, we have created designated Provider Care Teams.  These Care Teams include your primary Cardiologist (physician) and Advanced Practice Providers (APPs -  Physician Assistants and Nurse Practitioners) who all work together to provide you with the care you need, when you need it.  Your next appointment:   1 year(s)  The format for your next appointment:   In Person  Provider:   Lauree Chandler, MD     Other Instructions   Your cardiac CT will be scheduled at:   [facility] [address] [place], [place] [address] [phone]  Please arrive at the [facility] main entrance (entrance A) of [facility] 30 minutes prior to test start time. You can use the FREE valet parking offered at the main entrance (encouraged to control the heart rate for the test) Proceed to the [facility] Radiology Department (first floor) to check-in and test prep.  Please follow these instructions carefully (unless otherwise  directed):   On the Night Before the Test: Be sure to Drink plenty of water. Do not consume any caffeinated/decaffeinated beverages or chocolate 12 hours prior to your test. Do not take any antihistamines (Xyzal) 12 hours prior to your test. If the patient has contrast allergy: Patient will need a prescription for Prednisone and very clear instructions (as follows): Prednisone 50 mg - take 13 hours prior to test Take another Prednisone 50 mg 7 hours prior to test Take another Prednisone 50 mg 1 hour prior to test Take Benadryl 50 mg 1 hour prior to test Patient must complete all four doses of above prophylactic medications. Patient will need a ride after test due to Benadryl.  On the Day of the Test: Drink plenty of water until 1 hour prior to the test. Do not eat any food 4 hours prior to the test. You may take your regular medications prior to the test.  Take metoprolol (Lopressor) two hours prior to test. HOLD Hydrochlorothiazide morning of the test. FEMALES- please wear underwire-free bra if available, avoid dresses & tight clothing       After the Test: Drink plenty of water. After receiving IV contrast, you may experience a mild flushed feeling. This is normal. On occasion, you may experience a mild rash up to 24 hours after the test. This is not dangerous. If this occurs, you can take Benadryl 25 mg and increase your fluid intake. If you experience trouble breathing, this can be serious. If it is severe call 911 IMMEDIATELY. If it is mild, please call our office. If you take any of these medications:  Glipizide/Metformin, Avandament, Glucavance, please do not take 48 hours after completing test unless otherwise instructed.  We will call to schedule your test 2-4 weeks out understanding that some insurance companies will need an authorization prior to the service being performed.   For non-scheduling related questions, please contact the cardiac imaging nurse navigator should  you have any questions/concerns: Marchia Bond, Cardiac Imaging Nurse Navigator Gordy Clement, Cardiac Imaging Nurse Navigator [facility] Direct Office Dial: [phone]   For scheduling needs, including cancellations and rescheduling, please call [place], [phone].

## 2021-05-26 NOTE — Progress Notes (Signed)
Chief Complaint  Patient presents with   New Patient (Initial Visit)      History of Present Illness: 51 yo female with history of anxiety, depression, fibromyalgia, GERD, polycystic ovarian disease, HTN, hypothyroidism, IBS and lupus here today as a new patient for the evaluation of dyspnea on exertion. I take care of her mother [name]. She tells me that she is having dyspnea with exertion. No chest pain or pressure. She has noticed feeling more fatigued after moderate exertion. Occasional palpitations. No near syncope or syncope. She has multiple family member with CAD. She has never smoked.   Primary Care Physician: Binnie Rail, MD   Past Medical History:  Diagnosis Date   Anemia    Anxiety    Arthritis    inflammitory   Asthma    B12 deficiency    Back pain    Chronic joint pain    Constipation    DDD (degenerative disc disease), lumbar    low back and neck and shoulders   Depression    during divorce & legal matters   Fatty liver    Fibromyalgia    Gallbladder problem    GERD (gastroesophageal reflux disease)    History of kidney stones    Dr Phebe Colla   History of polycystic ovarian disease S/P BSO   Hx of anxiety disorder    Hypertension    a   Hypothyroidism    no meds   IBS (irritable bowel syndrome)    IBS (irritable bowel syndrome)    Infertility, female    Joint pain    Kidney problem    Lactose intolerance    Lupus (HCC) hx positive ANA   followed by Dr Gavin Pound; possible lupus   Multiple food allergies    Myalgia    Osteoarthritis    Polyarthritis, 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[place])    POLYCYSTIC OVARIAN DISEASE 03/02/2007   Qualifier: Diagnosis of  By: Linna Darner MD, Rae Mar BSO for cysts & endometriosis; Dr Reino Kent, Gyn Seeing Dr Paula Compton     PONV (postoperative nausea and vomiting)    Pre-diabetes    Prediabetes    Sweating profusely    Symptomatic mammary hypertrophy    URI (upper respiratory infection)    currently on cefdinir     Past Surgical History:  Procedure Laterality Date   [place]   exploratory lap   BACK SURGERY     BREAST REDUCTION SURGERY Bilateral 03/26/2015   Procedure: BILATERAL BREAST REDUCTION ;  Surgeon: Wallace Going, DO;  Location: [place];  Service: Plastics;  Laterality: Bilateral;   CARPAL TUNNEL RELEASE     bilateral; Dr Sherwood Gambler   COLONOSCOPY     in [place]  07-09-2004   HX BILATERAL RENAL STONES/ RIGHT FLANK PAIN   CYSTOSCOPY W/ URETERAL STENT PLACEMENT  11/12/2011   Procedure: CYSTOSCOPY WITH RETROGRADE PYELOGRAM/URETERAL STENT PLACEMENT;  Surgeon: Alexis Frock, MD;  Location: [facility];  Service: Urology;  Laterality: Right;   CYSTOSCOPY W/ URETERAL STENT REMOVAL  12/07/2011   Procedure: CYSTOSCOPY WITH STENT REMOVAL;  Surgeon: Alexis Frock, MD;  Location: [facility];  Service: Urology;  Laterality: Right;   CYSTOSCOPY/RETROGRADE/URETEROSCOPY/STONE EXTRACTION WITH BASKET  12/07/2011   Procedure: CYSTOSCOPY/RETROGRADE/URETEROSCOPY/STONE EXTRACTION WITH BASKET;  Surgeon: Alexis Frock, MD;  Location: [facility];  Service: Urology;  Laterality: Right;   DILATION AND CURETTAGE OF UTERUS     X5   EXCISION LEFT BARTHOLIN GLAND  02-05-2002   EYE SURGERY     Retinal tears surgery bilateral   LAPAROSCOPIC ASSISTED VAGINAL HYSTERECTOMY  2000   LAPAROSCOPIC CHOLECYSTECTOMY  05-31-2007   LAPAROSCOPIC LASER ABLATION ENDOMETRIOSIS AND LEFT SALPINGO-OOPHORECTOMY  11-03-1999   LAPAROSCOPY WITH RIGHT SALPINGO-OOPHECTOMY/ LYSIS ADHESIONS AND ABLATION ENDOMETRIOSIS  05-17-2001   LIPOSUCTION Bilateral 03/26/2015   Procedure: WITH LIPOSUCTION;  Surgeon: Wallace Going, DO;  Location: [place];  Service: Plastics;  Laterality: Bilateral;   LUMBAR LAMINECTOMY  2003   L4 - L5   NECK SURGERY  2017   PLANTAR FASCIA SURGERY      right   PLANTAR FASCIA SURGERY  02/2011   left   REDUCTION MAMMAPLASTY     RIGHT URETEROSCOPIC STONE EXTRACTION  08-04-2000   SHOULDER ARTHROSCOPY WITH ROTATOR CUFF REPAIR Right 05/11/2020   Procedure: SHOULDER ARTHROSCOPY WITH ROTATOR CUFF REPAIR;  Surgeon: Lovell Sheehan, MD;  Location: [facility];  Service: Orthopedics;  Laterality: Right;   TONSILLECTOMY     URETERAL STENT PLACEMENT  11/12/11   Dr Tresa Moore    Current Outpatient Medications  Medication Sig Dispense Refill   albuterol (VENTOLIN HFA) 108 (90 Base) MCG/ACT inhaler TAKE 2 PUFFS BY MOUTH EVERY 6 HOURS AS NEEDED FOR WHEEZE OR SHORTNESS OF BREATH (Patient taking differently: Inhale 2 puffs into the lungs every 6 (six) hours as needed for shortness of breath or wheezing.) 8.5 g 5   Blood Glucose Monitoring Suppl (ONE TOUCH ULTRA 2) w/Device KIT OneTouch Ultra2 Meter     celecoxib (CELEBREX) 200 MG capsule Take 200 mg by mouth 2 (two) times daily.     diphenhydrAMINE (BENADRYL) 50 MG capsule Take one capsule 1 hour prior to scan. 1 capsule 0   diphenhydramine-acetaminophen (TYLENOL PM) 25-500 MG TABS tablet Take 2 tablets by mouth at bedtime.     DULoxetine (CYMBALTA) 30 MG capsule TAKE 1 CAPSULE BY MOUTH EVERY DAY 90 capsule 2   famotidine (PEPCID) 20 MG tablet TAKE 1 TABLET BY MOUTH EVERYDAY AT BEDTIME 90 tablet 1   glucose blood (ONETOUCH ULTRA) test strip TEST BLOOD SUGARS ONCE A DAY 100 strip 0   hydrochlorothiazide (MICROZIDE) 12.5 MG capsule TAKE 1 CAPSULE BY MOUTH EVERY DAY 90 capsule 1   hydroxychloroquine (PLAQUENIL) 200 MG tablet Take 200 mg by mouth 2 (two) times daily.     Insulin Pen Needle (BD PEN NEEDLE NANO U/F) 32G X 4 MM MISC 1 each by Other route daily. 100 each 0   levocetirizine (XYZAL) 5 MG tablet TAKE 1 TABLET BY MOUTH EVERY DAY IN THE EVENING 90 tablet 1   lisinopril (ZESTRIL) 40 MG tablet TAKE 1 TABLET BY MOUTH EVERY DAY 90 tablet 1   metFORMIN (GLUCOPHAGE-XR) 500 MG 24 hr tablet TAKE 1 TABLET (500 MG  TOTAL) BY MOUTH 2 (TWO) TIMES DAILY BEFORE A MEAL. 180 tablet 0   metoprolol tartrate (LOPRESSOR) 100 MG tablet Take 1 tablet (100 mg total) by mouth once for 1 dose. Take 90-120 minutes prior to scan. 1 tablet 0   OneTouch Delica Lancets 67E MISC 1 each by Does not apply route 2 (two) times daily. 100 each 0   polyethylene glycol powder (GLYCOLAX/MIRALAX) 17 GM/SCOOP powder Take 17 g by mouth daily. 3350 g 1   predniSONE (DELTASONE) 50 MG tablet Take one tablet 13 hours, 7 hours, and 1 hour prior to scan. 3  tablet 0   Semaglutide, 1 MG/DOSE, (OZEMPIC, 1 MG/DOSE,) 4 MG/3ML SOPN Inject 1 mg into the skin every 7 (seven) days. 3 mL 0   No current facility-administered medications for this visit.    Allergies  Allergen Reactions   Chlorhexidine Gluconate Rash, Itching and Swelling    Developed severe rash where chloraprep was used on chest area   Ivp Dye [Iodinated Contrast Media] Rash and Other (See Comments)    Flushing, minor facial rash & dyspnea   Nalbuphine Shortness Of Breath and Rash    Nubain caused respiratory distress & rash   Septra [Sulfamethoxazole-Trimethoprim] Shortness Of Breath and Rash   Adhesive [Tape] Rash   Diclofenac Other (See Comments)    GI Upset   Humira [Adalimumab]     "Did not like the way I felt"   Methotrexate Derivatives     GI upset     Augmentin [Amoxicillin-Pot Clavulanate] Nausea And Vomiting   Betadine [Povidone Iodine] Rash   Latex Rash    Social History   Socioeconomic History   Marital status: Married    Spouse name: Darnelle Maffucci   Number of children: 0   Years of education: college   Highest education level: Not on file  Occupational History   Occupation: disabled  Tobacco Use   Smoking status: Never   Smokeless tobacco: Never  Substance and Sexual Activity   Alcohol use: Yes    Alcohol/week: 1.0 standard drink    Types: 1 Shots of liquor per week    Comment:  very rarely   Drug use: No   Sexual activity: Yes    Birth  control/protection: Surgical  Other Topics Concern   Not on file  Social History Narrative   Patient Lives at home with her husband Darnelle Maffucci)   Disabled.   Education two years of college.   Right handed.   Caffeine coffee and sweet tea. Not daily.   Social Determinants of Health   Financial Resource Strain: Low Risk    Difficulty of Paying Living Expenses: Not hard at all  Food Insecurity: No Food Insecurity   Worried About Charity fundraiser in the Last Year: Never true   [place] in the Last Year: Never true  Transportation Needs: No Transportation Needs   Lack of Transportation (Medical): No   Lack of Transportation (Non-Medical): No  Physical Activity: Sufficiently Active   Days of Exercise per Week: 7 days   Minutes of Exercise per Session: 30 min  Stress: No Stress Concern Present   Feeling of Stress : Not at all  Social Connections: Unknown   Frequency of Communication with Friends and Family: More than three times a week   Frequency of Social Gatherings with Friends and Family: More than three times a week   Attends Religious Services: Patient refused   Marine scientist or Organizations: Patient refused   Attends Archivist Meetings: Patient refused   Marital Status: Married  Human resources officer Violence: Not At Risk   Fear of Current or Ex-Partner: No   Emotionally Abused: No   Physically Abused: No   Sexually Abused: No    Family History  Problem Relation Age of Onset   Hypertension Mother    Colon polyps Mother    Atrial fibrillation Mother        Dr Angelena Form   Hyperlipidemia Mother    Heart disease Mother    Anxiety disorder Mother    Sleep apnea Mother  Obesity Mother    Heart attack Father 11   Diabetes Father    Hypertension Father    Hyperlipidemia Father    Heart disease Father    Stroke Father    Obesity Father    Hypertension Sister    Heart attack Paternal Grandmother 26   Breast cancer Paternal Aunt 54   Colon  cancer Maternal Uncle    Colon cancer Paternal Uncle    Colon polyps Maternal Grandmother    Stroke Maternal Grandmother 76   Diabetes Paternal Grandfather    Stroke Maternal Grandfather 37    Review of Systems:  As stated in the HPI and otherwise negative.   BP 126/74    Pulse 77    Ht _0  (1.727 m)    Wt 241 lb 12.8 oz (109.7 kg)    SpO2 97%    BMI 36.77 kg/m   Physical Examination: General: Well developed, well nourished, NAD  HEENT: OP clear, mucus membranes moist  SKIN: warm, dry. No rashes. Neuro: No focal deficits  Musculoskeletal: Muscle strength 5/5 all ext  Psychiatric: Mood and affect normal  Neck: No JVD, no carotid bruits, no thyromegaly, no lymphadenopathy.  Lungs:Clear bilaterally, no wheezes, rhonci, crackles Cardiovascular: Regular rate and rhythm. No murmurs, gallops or rubs. Abdomen:Soft. Bowel sounds present. Non-tender.  Extremities: No lower extremity edema. Pulses are 2 + in the bilateral DP/PT.  EKG:  EKG is ordered today. The ekg ordered today demonstrates Sinus  Recent Labs: 08/13/2020: Hemoglobin 13.0; Platelets 266 12/31/2020: TSH 2.590 04/27/2021: ALT 17; BUN 27; Creatinine, Ser 0.88; Potassium 4.6; Sodium 143   Lipid Panel    Component Value Date/Time   CHOL 136 12/31/2020 0936   TRIG 89 12/31/2020 0936   HDL 53 12/31/2020 0936   CHOLHDL 3 08/16/2016 1011   VLDL 12.2 08/16/2016 1011   LDLCALC 66 12/31/2020 0936     Wt Readings from Last 3 Encounters:  05/26/21 241 lb 12.8 oz (109.7 kg)  04/28/21 227 lb (103 kg)  04/27/21 227 lb (103 kg)     Assessment and Plan:   1. Dyspnea on exertion: She has a strong family history of CAD. Her personal risk factors for CAD include include HTN and obesity. Given her dyspnea on exertion, will arrange a coronary CTA to exclude CAD. BMET today. Echo to assess LV function and exclude structural heart disease.   Labs/ tests ordered today include:   Orders Placed This Encounter  Procedures   CT  CORONARY MORPH W/CTA COR W/SCORE W/CA W/CM &/OR WO/CM   Basic metabolic panel   EKG [id number]   ECHOCARDIOGRAM COMPLETE   Disposition:   F/U with me in one year.   Signed, Lauree Chandler, MD 05/26/2021 1:32 PM    [facility] [address], [place], [place]  [address] Phone: [phone]; Fax: [phone]

## 2021-06-01 ENCOUNTER — Encounter (INDEPENDENT_AMBULATORY_CARE_PROVIDER_SITE_OTHER): Payer: Self-pay | Admitting: Family Medicine

## 2021-06-01 ENCOUNTER — Ambulatory Visit (INDEPENDENT_AMBULATORY_CARE_PROVIDER_SITE_OTHER): Payer: PPO | Admitting: Family Medicine

## 2021-06-01 ENCOUNTER — Ambulatory Visit: Payer: PPO | Admitting: Cardiovascular Disease

## 2021-06-01 ENCOUNTER — Other Ambulatory Visit: Payer: Self-pay

## 2021-06-01 VITALS — BP 135/85 | HR 83 | Temp 98.6°F | Ht 68.0 in | Wt 236.0 lb

## 2021-06-01 DIAGNOSIS — Z6835 Body mass index (BMI) 35.0-35.9, adult: Secondary | ICD-10-CM | POA: Diagnosis not present

## 2021-06-01 DIAGNOSIS — E669 Obesity, unspecified: Secondary | ICD-10-CM

## 2021-06-01 DIAGNOSIS — F418 Other specified anxiety disorders: Secondary | ICD-10-CM

## 2021-06-01 DIAGNOSIS — E8881 Metabolic syndrome: Secondary | ICD-10-CM | POA: Diagnosis not present

## 2021-06-01 MED ORDER — SEMAGLUTIDE (2 MG/DOSE) 8 MG/3ML ~~LOC~~ SOPN: 2.0000 mg | PEN_INJECTOR | SUBCUTANEOUS | 0 refills | Status: DC

## 2021-06-01 NOTE — Telephone Encounter (Signed)
Please see message and advise.  Thank you. ° °

## 2021-06-01 NOTE — Telephone Encounter (Signed)
Dr.Beasley 

## 2021-06-02 MED ORDER — TOPIRAMATE 50 MG PO TABS: 50.0000 mg | ORAL_TABLET | Freq: Every day | ORAL | 0 refills | Status: DC

## 2021-06-02 NOTE — Telephone Encounter (Signed)
Please send in rx for topiramate 50 mg qday #30 0 RF

## 2021-06-02 NOTE — Telephone Encounter (Signed)
Medication sent per Dr. Leafy Ro. ?

## 2021-06-02 NOTE — Progress Notes (Signed)
Chief Complaint:   OBESITY [name] is here to discuss her progress with her obesity treatment plan along with follow-up of her obesity related diagnoses. [name] is on keeping a food journal and adhering to recommended goals of 1200 calories and 75+ grams of protein daily and states she is following her eating plan approximately 50% of the time. [name] states she is walking for 60 minutes 2 times per week.  Today's visit was #: 3 Starting weight: 241 lbs Starting date: 09/28/2016 Today's weight: 236 lbs Today's date: 06/01/2021 Total lbs lost to date: 5 Total lbs lost since last in-office visit: 0  Interim History: [name] has struggled with her psoriatic arthritis and she has had a reaction to [employer], and she had 4 weeks of prednisone which caused weight gain, increased hunger, and irritability. She finished her prednisone yesterday and she is ready to work on weight loss.  Subjective:   1. Insulin resistance [name] is stable on Ozempic but she has been on a high dose of prednisone recently, and notes increased polyphagia. She is open to increasing her dose.  Assessment/Plan:   1. Insulin resistance [name] agreed to increase Ozempic to 2 mg, with no refills (she is to take 1.5 mg for now). She will continue to work on weight loss, exercise, and decreasing simple carbohydrates to help decrease the risk of diabetes. [name] agreed to follow-up with [place] as directed to closely monitor her progress.  - Semaglutide, 2 MG/DOSE, 8 MG/3ML SOPN; Inject 2 mg as directed once a week.  Dispense: 3 mL; Refill: 0  2. Obesity with current BMI of 35.9 [name] is currently in the action stage of change. As such, her goal is to continue with weight loss efforts. She has agreed to following a lower carbohydrate, vegetable and lean protein rich diet plan.   Exercise goals: As is.  Behavioral modification strategies: increasing lean protein intake.  [name] has agreed to follow-up with  our clinic in 4 weeks. She was informed of the importance of frequent follow-up visits to maximize her success with intensive lifestyle modifications for her multiple health conditions.   Objective:   Blood pressure 135/85, pulse 83, temperature 98.6 F (37 C), height 5\' 8"  (1.727 m), weight 236 lb (107 kg), SpO2 98 %. Body mass index is 35.88 kg/m.  General: Cooperative, alert, well developed, in no acute distress. HEENT: Conjunctivae and lids unremarkable. Cardiovascular: Regular rhythm.  Lungs: Normal work of breathing. Neurologic: No focal deficits.   Lab Results  Component Value Date   CREATININE 0.91 05/26/2021   BUN 25 (H) 05/26/2021   NA 142 05/26/2021   K 4.4 05/26/2021   CL 107 (H) 05/26/2021   CO2 25 05/26/2021   Lab Results  Component Value Date   ALT 17 04/27/2021   AST 14 04/27/2021   ALKPHOS 82 04/27/2021   BILITOT <0.2 04/27/2021   Lab Results  Component Value Date   HGBA1C 5.5 04/27/2021   HGBA1C 5.7 (H) 12/31/2020   HGBA1C 5.8 (H) 08/13/2020   HGBA1C 5.9 04/28/2020   HGBA1C 5.5 11/11/2019   Lab Results  Component Value Date   INSULIN 28.5 (H) 04/27/2021   INSULIN 17.9 12/31/2020   INSULIN 14.2 08/13/2020   INSULIN 25.1 (H) 04/30/2020   INSULIN 16.4 11/11/2019   Lab Results  Component Value Date   TSH 2.590 12/31/2020   Lab Results  Component Value Date   CHOL 136 12/31/2020   HDL 53 12/31/2020   LDLCALC 66 12/31/2020  TRIG 89 12/31/2020   CHOLHDL 3 08/16/2016   Lab Results  Component Value Date   VD25OH 35.1 04/27/2021   VD25OH 45.9 12/31/2020   VD25OH 34.8 08/13/2020   Lab Results  Component Value Date   WBC 6.7 08/13/2020   HGB 13.0 08/13/2020   HCT 38.8 08/13/2020   MCV 84 08/13/2020   PLT 266 08/13/2020   No results found for: IRON, TIBC, FERRITIN  Attestation Statements:   Reviewed by clinician on day of visit: allergies, medications, problem list, medical history, surgical history, family history, social history,  and previous encounter notes.   I, Trixie Dredge, am acting as transcriptionist for Dennard Nip, MD.  I have reviewed the above documentation for accuracy and completeness, and I agree with the above. -  Dennard Nip, MD

## 2021-06-09 ENCOUNTER — Other Ambulatory Visit: Payer: Self-pay

## 2021-06-09 ENCOUNTER — Telehealth (HOSPITAL_COMMUNITY): Payer: Self-pay | Admitting: Emergency Medicine

## 2021-06-09 ENCOUNTER — Ambulatory Visit (HOSPITAL_COMMUNITY): Payer: PPO | Attending: Cardiovascular Disease

## 2021-06-09 DIAGNOSIS — R0609 Other forms of dyspnea: Secondary | ICD-10-CM | POA: Insufficient documentation

## 2021-06-09 LAB — ECHOCARDIOGRAM COMPLETE
Area-P 1/2: 3.63 cm2
S' Lateral: 2.7 cm

## 2021-06-09 NOTE — Telephone Encounter (Signed)
Attempted to call patient regarding upcoming cardiac CT appointment. °Left message on voicemail with name and callback number °Jaylah Goodlow RN Navigator Cardiac Imaging °[facility] °[phone] Office °[phone] Cell ° °

## 2021-06-11 ENCOUNTER — Ambulatory Visit (HOSPITAL_COMMUNITY)
Admission: RE | Admit: 2021-06-11 | Discharge: 2021-06-11 | Disposition: A | Discharge status: Home / Self Care | Payer: PPO | Source: Ambulatory Visit | Attending: Cardiovascular Disease | Admitting: Cardiovascular Disease

## 2021-06-11 ENCOUNTER — Other Ambulatory Visit: Payer: Self-pay

## 2021-06-11 DIAGNOSIS — R0609 Other forms of dyspnea: Secondary | ICD-10-CM | POA: Insufficient documentation

## 2021-06-11 DIAGNOSIS — I517 Cardiomegaly: Secondary | ICD-10-CM | POA: Insufficient documentation

## 2021-06-11 MED ORDER — DILTIAZEM HCL 25 MG/5ML IV SOLN
10.0000 mg | INTRAVENOUS | Status: AC | PRN
  Administered 2021-06-11: 10 mg via INTRAVENOUS

## 2021-06-11 MED ORDER — METOPROLOL TARTRATE 5 MG/5ML IV SOLN
10.0000 mg | INTRAVENOUS | Status: AC | PRN
  Administered 2021-06-11: 10 mg via INTRAVENOUS

## 2021-06-11 MED ORDER — IOHEXOL 350 MG/ML SOLN
100.0000 mL | Freq: Once | INTRAVENOUS | Status: AC | PRN
  Administered 2021-06-11: 100 mL via INTRAVENOUS

## 2021-06-11 MED ORDER — DILTIAZEM HCL 25 MG/5ML IV SOLN
INTRAVENOUS | Status: AC
  Administered 2021-06-11: 10 mg via INTRAVENOUS
  Filled 2021-06-11: qty 5

## 2021-06-11 MED ORDER — METOPROLOL TARTRATE 5 MG/5ML IV SOLN
INTRAVENOUS | Status: AC
  Administered 2021-06-11: 10 mg via INTRAVENOUS
  Filled 2021-06-11: qty 20

## 2021-06-11 MED ORDER — NITROGLYCERIN 0.4 MG SL SUBL
SUBLINGUAL_TABLET | SUBLINGUAL | Status: AC
  Filled 2021-06-11: qty 2

## 2021-06-11 MED ORDER — NITROGLYCERIN 0.4 MG SL SUBL
0.8000 mg | SUBLINGUAL_TABLET | Freq: Once | SUBLINGUAL | Status: AC
Start: 2021-06-11 — End: 2021-06-11
  Administered 2021-06-11: 0.8 mg via SUBLINGUAL

## 2021-06-11 NOTE — Progress Notes (Signed)
Notified Dr. Debara Pickett that patient's heart rate remains elevated in 80's after getting IV lopressor and cardizem. Patient was premedicated with oral lopressor 100 mg and also the 13 hour prep for contrast allergy (Prednisone and Benadryl)  Dr. Debara Pickett advised to proceed with scan even with elevated heart rate since she had been medicated for contrast allergy.  ?

## 2021-06-15 DIAGNOSIS — R82998 Other abnormal findings in urine: Secondary | ICD-10-CM | POA: Diagnosis not present

## 2021-06-15 DIAGNOSIS — N200 Calculus of kidney: Secondary | ICD-10-CM | POA: Diagnosis not present

## 2021-06-15 DIAGNOSIS — R3915 Urgency of urination: Secondary | ICD-10-CM | POA: Diagnosis not present

## 2021-06-24 ENCOUNTER — Other Ambulatory Visit (INDEPENDENT_AMBULATORY_CARE_PROVIDER_SITE_OTHER): Payer: Self-pay | Admitting: Family Medicine

## 2021-06-24 DIAGNOSIS — F418 Other specified anxiety disorders: Secondary | ICD-10-CM

## 2021-06-28 DIAGNOSIS — J301 Allergic rhinitis due to pollen: Secondary | ICD-10-CM | POA: Diagnosis not present

## 2021-06-28 DIAGNOSIS — H6063 Unspecified chronic otitis externa, bilateral: Secondary | ICD-10-CM | POA: Diagnosis not present

## 2021-06-29 ENCOUNTER — Other Ambulatory Visit: Payer: Self-pay

## 2021-06-29 ENCOUNTER — Encounter (INDEPENDENT_AMBULATORY_CARE_PROVIDER_SITE_OTHER): Payer: Self-pay | Admitting: Family Medicine

## 2021-06-29 ENCOUNTER — Ambulatory Visit (INDEPENDENT_AMBULATORY_CARE_PROVIDER_SITE_OTHER): Payer: PPO | Admitting: Family Medicine

## 2021-06-29 VITALS — BP 118/79 | HR 98 | Temp 98.6°F | Ht 68.0 in | Wt 229.0 lb

## 2021-06-29 DIAGNOSIS — K5909 Other constipation: Secondary | ICD-10-CM

## 2021-06-29 DIAGNOSIS — F3289 Other specified depressive episodes: Secondary | ICD-10-CM

## 2021-06-29 DIAGNOSIS — Z6834 Body mass index (BMI) 34.0-34.9, adult: Secondary | ICD-10-CM

## 2021-06-29 DIAGNOSIS — E1169 Type 2 diabetes mellitus with other specified complication: Secondary | ICD-10-CM

## 2021-06-29 DIAGNOSIS — E669 Obesity, unspecified: Secondary | ICD-10-CM

## 2021-06-29 MED ORDER — SEMAGLUTIDE (2 MG/DOSE) 8 MG/3ML ~~LOC~~ SOPN: 2.0000 mg | PEN_INJECTOR | SUBCUTANEOUS | 0 refills | Status: DC

## 2021-06-29 MED ORDER — TOPIRAMATE 50 MG PO TABS: 50.0000 mg | ORAL_TABLET | Freq: Every day | ORAL | 0 refills | Status: DC

## 2021-06-29 MED ORDER — METFORMIN HCL ER 500 MG PO TB24: 500.0000 mg | ORAL_TABLET | Freq: Two times a day (BID) | ORAL | 0 refills | Status: DC

## 2021-06-30 ENCOUNTER — Telehealth: Payer: PPO

## 2021-07-02 DIAGNOSIS — M5416 Radiculopathy, lumbar region: Secondary | ICD-10-CM | POA: Diagnosis not present

## 2021-07-05 NOTE — Progress Notes (Signed)
? ? ? ?Chief Complaint:  ? ?OBESITY ?Emme is here to discuss her progress with her obesity treatment plan along with follow-up of her obesity related diagnoses. Lilyana is on following a lower carbohydrate, vegetable and lean protein rich diet plan and states she is following her eating plan approximately 65% of the time. Oretta states she is doing yard work 1/2 day 2 times per week.   ? ?Today's visit was #: 40 ?Starting weight: 241 lbs ?Starting date: 09/28/2016 ?Today's weight: 229 lbs ?Today's date: 06/29/2021 ?Total lbs lost to date: 12 ?Total lbs lost since last in-office visit: 7 ? ?Interim History: Naiomi has done well with weight loss. Her hunger is controlled and she notes her appetite has decreased. She may not be eating enough nutrients.  ? ?Subjective:  ? ?1. Other constipation ?Evalee notes significant increase in constipation with hard painful BM, and likely related to [place]. She is trying to hydrate. ? ?2. Type 2 diabetes mellitus with other specified complication, without long-term current use of insulin ([place]) ?Helon is working on diet and exercise, and she is doing well with weight loss. She is on 1.5 mg of Ozempic, and her polyphagia has resolved.  ? ?3. Other depression with emotional eating ?Gwenith is doing very well with decreasing emotional eating behaviors, with no side effects. ? ?Assessment/Plan:  ? ?1. Other constipation ?Vadis agreed to start miralax 17 grams daily OTC, and she is to increase her water intake.  ? ?2. Type 2 diabetes mellitus with other specified complication, without long-term current use of insulin ([place]) ?Khaliah will continue her medications, and we will refill for Ozempic and metformin for 1 month. ? ?- Semaglutide, 2 MG/DOSE, 8 MG/3ML SOPN; Inject 2 mg as directed once a week.  Dispense: 3 mL; Refill: 0 ?- metFORMIN (GLUCOPHAGE-XR) 500 MG 24 hr tablet; Take 1 tablet (500 mg total) by mouth 2 (two) times daily before a meal.  Dispense: 180 tablet;  Refill: 0 ? ?3. Other depression with emotional eating ?We will refill Topamax for 1 month. Behavior modification techniques were discussed today to help Chastelyn deal with her emotional/non-hunger eating behaviors.  Orders and follow up as documented in patient record.  ? ?- topiramate (TOPAMAX) 50 MG tablet; Take 1 tablet (50 mg total) by mouth daily.  Dispense: 30 tablet; Refill: 0 ? ?4. Obesity with current BMI of 34.9 ?Mila is currently in the action stage of change. As such, her goal is to continue with weight loss efforts. She has agreed to keeping a food journal and adhering to recommended goals of 1200 calories and 75+ grams of protein daily.  ? ?Exercise goals: As is. ? ?Behavioral modification strategies: increasing lean protein intake and meal planning and cooking strategies. ? ?Dakotah has agreed to follow-up with our clinic in 4 weeks. She was informed of the importance of frequent follow-up visits to maximize her success with intensive lifestyle modifications for her multiple health conditions.  ? ?Objective:  ? ?Blood pressure 118/79, pulse 98, temperature 98.6 ?F (37 ?C), height '5\' 8"'$  (1.727 m), weight 229 lb (103.9 kg), SpO2 97 %. ?Body mass index is 34.82 kg/m?. ? ?General: Cooperative, alert, well developed, in no acute distress. ?HEENT: Conjunctivae and lids unremarkable. ?Cardiovascular: Regular rhythm.  ?Lungs: Normal work of breathing. ?Neurologic: No focal deficits.  ? ?Lab Results  ?Component Value Date  ? CREATININE 0.91 05/26/2021  ? BUN 25 (H) 05/26/2021  ? NA 142 05/26/2021  ? K 4.4 05/26/2021  ? CL 107 (H) 05/26/2021  ?  CO2 25 05/26/2021  ? ?Lab Results  ?Component Value Date  ? ALT 17 04/27/2021  ? AST 14 04/27/2021  ? ALKPHOS 82 04/27/2021  ? BILITOT <0.2 04/27/2021  ? ?Lab Results  ?Component Value Date  ? HGBA1C 5.5 04/27/2021  ? HGBA1C 5.7 (H) 12/31/2020  ? HGBA1C 5.8 (H) 08/13/2020  ? HGBA1C 5.9 04/28/2020  ? HGBA1C 5.5 11/11/2019  ? ?Lab Results  ?Component Value Date  ?  INSULIN 28.5 (H) 04/27/2021  ? INSULIN 17.9 12/31/2020  ? INSULIN 14.2 08/13/2020  ? INSULIN 25.1 (H) 04/30/2020  ? INSULIN 16.4 11/11/2019  ? ?Lab Results  ?Component Value Date  ? TSH 2.590 12/31/2020  ? ?Lab Results  ?Component Value Date  ? CHOL 136 12/31/2020  ? HDL 53 12/31/2020  ? [place] 66 12/31/2020  ? TRIG 89 12/31/2020  ? CHOLHDL 3 08/16/2016  ? ?Lab Results  ?Component Value Date  ? VD25OH 35.1 04/27/2021  ? VD25OH 45.9 12/31/2020  ? VD25OH 34.8 08/13/2020  ? ?Lab Results  ?Component Value Date  ? WBC 6.7 08/13/2020  ? HGB 13.0 08/13/2020  ? HCT 38.8 08/13/2020  ? MCV 84 08/13/2020  ? PLT 266 08/13/2020  ? ?No results found for: IRON, TIBC, FERRITIN ? ?Obesity Behavioral Intervention:  ? ?Approximately 15 minutes were spent on the discussion below. ? ?ASK: ?We discussed the diagnosis of obesity with [name] today and Maximina agreed to give [place] permission to discuss obesity behavioral modification therapy today. ? ?ASSESS: ?Aliviah has the diagnosis of obesity and her BMI today is 34.9. Mikell is in the action stage of change.  ? ?ADVISE: ?Rayvon was educated on the multiple health risks of obesity as well as the benefit of weight loss to improve her health. She was advised of the need for long term treatment and the importance of lifestyle modifications to improve her current health and to decrease her risk of future health problems. ? ?AGREE: ?Multiple dietary modification options and treatment options were discussed and Kelleigh agreed to follow the recommendations documented in the above note. ? ?ARRANGE: ?Koralee was educated on the importance of frequent visits to treat obesity as outlined per CMS and USPSTF guidelines and agreed to schedule her next follow up appointment today. ? ?Attestation Statements:  ? ?Reviewed by clinician on day of visit: allergies, medications, problem list, medical history, surgical history, family history, social history, and previous encounter notes. ? ? ?I,  Trixie Dredge, am acting as transcriptionist for Dennard Nip, MD. ? ?I have reviewed the above documentation for accuracy and completeness, and I agree with the above. -  Dennard Nip, MD ? ? ?

## 2021-07-07 DIAGNOSIS — M5416 Radiculopathy, lumbar region: Secondary | ICD-10-CM | POA: Diagnosis not present

## 2021-07-08 ENCOUNTER — Encounter: Payer: Self-pay | Admitting: Internal Medicine

## 2021-07-11 MED ORDER — CITALOPRAM HYDROBROMIDE 20 MG PO TABS: 20.0000 mg | ORAL_TABLET | Freq: Every day | ORAL | 3 refills | Status: DC

## 2021-07-13 DIAGNOSIS — J301 Allergic rhinitis due to pollen: Secondary | ICD-10-CM | POA: Diagnosis not present

## 2021-07-19 DIAGNOSIS — J301 Allergic rhinitis due to pollen: Secondary | ICD-10-CM | POA: Diagnosis not present

## 2021-07-21 ENCOUNTER — Other Ambulatory Visit (INDEPENDENT_AMBULATORY_CARE_PROVIDER_SITE_OTHER): Payer: Self-pay | Admitting: Family Medicine

## 2021-07-21 DIAGNOSIS — F3289 Other specified depressive episodes: Secondary | ICD-10-CM

## 2021-07-26 DIAGNOSIS — J301 Allergic rhinitis due to pollen: Secondary | ICD-10-CM | POA: Diagnosis not present

## 2021-07-27 ENCOUNTER — Ambulatory Visit (INDEPENDENT_AMBULATORY_CARE_PROVIDER_SITE_OTHER): Payer: PPO | Admitting: Family Medicine

## 2021-07-27 ENCOUNTER — Encounter (INDEPENDENT_AMBULATORY_CARE_PROVIDER_SITE_OTHER): Payer: Self-pay | Admitting: Family Medicine

## 2021-07-27 VITALS — BP 116/82 | HR 87 | Wt 228.0 lb

## 2021-07-27 DIAGNOSIS — E8881 Metabolic syndrome: Secondary | ICD-10-CM | POA: Diagnosis not present

## 2021-07-27 DIAGNOSIS — E669 Obesity, unspecified: Secondary | ICD-10-CM

## 2021-07-27 DIAGNOSIS — Z6834 Body mass index (BMI) 34.0-34.9, adult: Secondary | ICD-10-CM

## 2021-07-27 DIAGNOSIS — F3289 Other specified depressive episodes: Secondary | ICD-10-CM

## 2021-07-27 DIAGNOSIS — E1169 Type 2 diabetes mellitus with other specified complication: Secondary | ICD-10-CM

## 2021-07-27 DIAGNOSIS — Z6836 Body mass index (BMI) 36.0-36.9, adult: Secondary | ICD-10-CM

## 2021-07-27 MED ORDER — METFORMIN HCL ER 500 MG PO TB24: 500.0000 mg | ORAL_TABLET | Freq: Two times a day (BID) | ORAL | 0 refills | Status: DC

## 2021-07-27 MED ORDER — TOPIRAMATE 50 MG PO TABS: 50.0000 mg | ORAL_TABLET | Freq: Every day | ORAL | 0 refills | Status: DC

## 2021-07-27 MED ORDER — SEMAGLUTIDE (2 MG/DOSE) 8 MG/3ML ~~LOC~~ SOPN: 2.0000 mg | PEN_INJECTOR | SUBCUTANEOUS | 0 refills | Status: DC

## 2021-07-29 DIAGNOSIS — J301 Allergic rhinitis due to pollen: Secondary | ICD-10-CM | POA: Diagnosis not present

## 2021-08-02 DIAGNOSIS — J301 Allergic rhinitis due to pollen: Secondary | ICD-10-CM | POA: Diagnosis not present

## 2021-08-04 DIAGNOSIS — M25552 Pain in left hip: Secondary | ICD-10-CM | POA: Diagnosis not present

## 2021-08-05 DIAGNOSIS — J301 Allergic rhinitis due to pollen: Secondary | ICD-10-CM | POA: Diagnosis not present

## 2021-08-06 ENCOUNTER — Telehealth: Payer: PPO

## 2021-08-09 DIAGNOSIS — J301 Allergic rhinitis due to pollen: Secondary | ICD-10-CM | POA: Diagnosis not present

## 2021-08-09 NOTE — Progress Notes (Signed)
? ? ? ?Chief Complaint:  ? ?OBESITY ?Cameo is here to discuss her progress with her obesity treatment plan along with follow-up of her obesity related diagnoses. Tayla is on keeping a food journal and adhering to recommended goals of 1200 calories and 75+ grams of protein daily and states she is following her eating plan approximately 60% of the time. Mora states she is doing yard work for 12 hours 5 times per week.  ? ?Today's visit was #: 83 ?Starting weight: 241 lbs ?Starting date: 6/274/2018 ?Today's weight: 228 lbs ?Today's date: 07/27/2021 ?Total lbs lost to date: 52 ?Total lbs lost since last in-office visit: 1 ? ?Interim History: Aubre continues to go in the right direction. She has increased her activity and  yard work. Her hunger is starting to increase. ? ?Subjective:  ? ?1. Insulin resistance ?Ambrie is working on diet and exercise, but she notes polyphagia has increased and she would like to increase her dose to 2 mg (from 1.5 mg). ? ?2. Other depression with emotional eating ?Shaelynn started Topamax and she noted the dysgeusia with carbonated liquids. No other side effects were noted.  ? ?Assessment/Plan:  ? ?1. Insulin resistance ?Ashyra will continue her medications, and we will refill Ozempic to 2 mg for 1 month; and we will refill metformin for 1 month.  ? ?- metFORMIN (GLUCOPHAGE-XR) 500 MG 24 hr tablet; Take 1 tablet (500 mg total) by mouth 2 (two) times daily before a meal.  Dispense: 180 tablet; Refill: 0 ?- Semaglutide, 2 MG/DOSE, 8 MG/3ML SOPN; Inject 2 mg as directed once a week.  Dispense: 3 mL; Refill: 0 ? ?2. Other depression with emotional eating ?We will refill Topamax for 1 month. Behavior modification techniques were discussed today to help Merisa deal with her emotional/non-hunger eating behaviors.  Orders and follow up as documented in patient record.  ? ?- topiramate (TOPAMAX) 50 MG tablet; Take 1 tablet (50 mg total) by mouth daily.  Dispense: 30 tablet;  Refill: 0 ? ?3. Obesity with current BMI of 34.7 ?Nataleigh is currently in the action stage of change. As such, her goal is to continue with weight loss efforts. She has agreed to keeping a food journal and adhering to recommended goals of 1200 calories and 75+ grams of protein daily.  ? ?Exercise goals: As is.  ? ?Behavioral modification strategies: keeping a strict food journal. ? ?Lawanna has agreed to follow-up with our clinic in 4 weeks. She was informed of the importance of frequent follow-up visits to maximize her success with intensive lifestyle modifications for her multiple health conditions.  ? ?Objective:  ? ?Blood pressure 116/82, pulse 87, weight 228 lb (103.4 kg), SpO2 98 %. ?Body mass index is 34.67 kg/m?. ? ?General: Cooperative, alert, well developed, in no acute distress. ?HEENT: Conjunctivae and lids unremarkable. ?Cardiovascular: Regular rhythm.  ?Lungs: Normal work of breathing. ?Neurologic: No focal deficits.  ? ?Lab Results  ?Component Value Date  ? CREATININE 0.91 05/26/2021  ? BUN 25 (H) 05/26/2021  ? NA 142 05/26/2021  ? K 4.4 05/26/2021  ? CL 107 (H) 05/26/2021  ? CO2 25 05/26/2021  ? ?Lab Results  ?Component Value Date  ? ALT 17 04/27/2021  ? AST 14 04/27/2021  ? ALKPHOS 82 04/27/2021  ? BILITOT <0.2 04/27/2021  ? ?Lab Results  ?Component Value Date  ? HGBA1C 5.5 04/27/2021  ? HGBA1C 5.7 (H) 12/31/2020  ? HGBA1C 5.8 (H) 08/13/2020  ? HGBA1C 5.9 04/28/2020  ? HGBA1C 5.5 11/11/2019  ? ?  Lab Results  ?Component Value Date  ? INSULIN 28.5 (H) 04/27/2021  ? INSULIN 17.9 12/31/2020  ? INSULIN 14.2 08/13/2020  ? INSULIN 25.1 (H) 04/30/2020  ? INSULIN 16.4 11/11/2019  ? ?Lab Results  ?Component Value Date  ? TSH 2.590 12/31/2020  ? ?Lab Results  ?Component Value Date  ? CHOL 136 12/31/2020  ? HDL 53 12/31/2020  ? [place] 66 12/31/2020  ? TRIG 89 12/31/2020  ? CHOLHDL 3 08/16/2016  ? ?Lab Results  ?Component Value Date  ? VD25OH 35.1 04/27/2021  ? VD25OH 45.9 12/31/2020  ? VD25OH 34.8 08/13/2020   ? ?Lab Results  ?Component Value Date  ? WBC 6.7 08/13/2020  ? HGB 13.0 08/13/2020  ? HCT 38.8 08/13/2020  ? MCV 84 08/13/2020  ? PLT 266 08/13/2020  ? ?No results found for: IRON, TIBC, FERRITIN ? ?Obesity Behavioral Intervention:  ? ?Approximately 15 minutes were spent on the discussion below. ? ?ASK: ?We discussed the diagnosis of obesity with [name] today and Acire agreed to give [place] permission to discuss obesity behavioral modification therapy today. ? ?ASSESS: ?Nahlia has the diagnosis of obesity and her BMI today is 34.7. Yilin is in the action stage of change.  ? ?ADVISE: ?Dafina was educated on the multiple health risks of obesity as well as the benefit of weight loss to improve her health. She was advised of the need for long term treatment and the importance of lifestyle modifications to improve her current health and to decrease her risk of future health problems. ? ?AGREE: ?Multiple dietary modification options and treatment options were discussed and Lacinda agreed to follow the recommendations documented in the above note. ? ?ARRANGE: ?Kahealani was educated on the importance of frequent visits to treat obesity as outlined per CMS and USPSTF guidelines and agreed to schedule her next follow up appointment today. ? ?Attestation Statements:  ? ?Reviewed by clinician on day of visit: allergies, medications, problem list, medical history, surgical history, family history, social history, and previous encounter notes. ? ? ?I, Trixie Dredge, am acting as transcriptionist for Dennard Nip, MD. ? ?I have reviewed the above documentation for accuracy and completeness, and I agree with the above. -  Dennard Nip, MD ? ? ?

## 2021-08-11 DIAGNOSIS — J301 Allergic rhinitis due to pollen: Secondary | ICD-10-CM | POA: Diagnosis not present

## 2021-08-12 DIAGNOSIS — B078 Other viral warts: Secondary | ICD-10-CM | POA: Diagnosis not present

## 2021-08-12 DIAGNOSIS — L738 Other specified follicular disorders: Secondary | ICD-10-CM | POA: Diagnosis not present

## 2021-08-12 DIAGNOSIS — D225 Melanocytic nevi of trunk: Secondary | ICD-10-CM | POA: Diagnosis not present

## 2021-08-12 DIAGNOSIS — J301 Allergic rhinitis due to pollen: Secondary | ICD-10-CM | POA: Diagnosis not present

## 2021-08-16 DIAGNOSIS — J301 Allergic rhinitis due to pollen: Secondary | ICD-10-CM | POA: Diagnosis not present

## 2021-08-18 ENCOUNTER — Other Ambulatory Visit (INDEPENDENT_AMBULATORY_CARE_PROVIDER_SITE_OTHER): Payer: Self-pay | Admitting: Family Medicine

## 2021-08-18 DIAGNOSIS — F3289 Other specified depressive episodes: Secondary | ICD-10-CM

## 2021-08-19 ENCOUNTER — Other Ambulatory Visit: Payer: Self-pay | Admitting: Internal Medicine

## 2021-08-19 DIAGNOSIS — Z1231 Encounter for screening mammogram for malignant neoplasm of breast: Secondary | ICD-10-CM

## 2021-08-19 DIAGNOSIS — J301 Allergic rhinitis due to pollen: Secondary | ICD-10-CM | POA: Diagnosis not present

## 2021-08-20 DIAGNOSIS — M25552 Pain in left hip: Secondary | ICD-10-CM | POA: Diagnosis not present

## 2021-08-20 DIAGNOSIS — Z7960 Long term (current) use of unspecified immunomodulators and immunosuppressants: Secondary | ICD-10-CM | POA: Diagnosis not present

## 2021-08-20 DIAGNOSIS — L4050 Arthropathic psoriasis, unspecified: Secondary | ICD-10-CM | POA: Diagnosis not present

## 2021-08-20 DIAGNOSIS — L409 Psoriasis, unspecified: Secondary | ICD-10-CM | POA: Diagnosis not present

## 2021-08-20 DIAGNOSIS — M159 Polyosteoarthritis, unspecified: Secondary | ICD-10-CM | POA: Diagnosis not present

## 2021-08-23 DIAGNOSIS — J301 Allergic rhinitis due to pollen: Secondary | ICD-10-CM | POA: Diagnosis not present

## 2021-08-24 ENCOUNTER — Ambulatory Visit (INDEPENDENT_AMBULATORY_CARE_PROVIDER_SITE_OTHER): Payer: PPO | Admitting: Family Medicine

## 2021-08-24 VITALS — BP 94/68 | HR 89 | Temp 98.8°F | Ht 68.0 in | Wt 219.0 lb

## 2021-08-24 DIAGNOSIS — E1169 Type 2 diabetes mellitus with other specified complication: Secondary | ICD-10-CM | POA: Diagnosis not present

## 2021-08-24 DIAGNOSIS — Z7985 Long-term (current) use of injectable non-insulin antidiabetic drugs: Secondary | ICD-10-CM | POA: Diagnosis not present

## 2021-08-24 DIAGNOSIS — Z6833 Body mass index (BMI) 33.0-33.9, adult: Secondary | ICD-10-CM

## 2021-08-24 DIAGNOSIS — E669 Obesity, unspecified: Secondary | ICD-10-CM | POA: Diagnosis not present

## 2021-08-24 DIAGNOSIS — K580 Irritable bowel syndrome with diarrhea: Secondary | ICD-10-CM | POA: Diagnosis not present

## 2021-08-24 MED ORDER — SEMAGLUTIDE (2 MG/DOSE) 8 MG/3ML ~~LOC~~ SOPN: 2.0000 mg | PEN_INJECTOR | SUBCUTANEOUS | 0 refills | Status: DC

## 2021-08-25 LAB — CMP14+EGFR
ALT: 24 IU/L (ref 0–32)
AST: 26 IU/L (ref 0–40)
Albumin/Globulin Ratio: 1.7 (ref 1.2–2.2)
Albumin: 4.5 g/dL (ref 3.8–4.9)
Alkaline Phosphatase: 92 IU/L (ref 44–121)
BUN/Creatinine Ratio: 19 (ref 9–23)
BUN: 23 mg/dL (ref 6–24)
Bilirubin Total: <0.2 mg/dL (ref 0.0–1.2)
CO2: 22 mmol/L (ref 20–29)
Calcium: 9.7 mg/dL (ref 8.7–10.2)
Chloride: 104 mmol/L (ref 96–106)
Creatinine, Ser: 1.2 mg/dL — ABNORMAL HIGH (ref 0.57–1.00)
Globulin, Total: 2.6 g/dL (ref 1.5–4.5)
Glucose: 93 mg/dL (ref 70–99)
Potassium: 4.3 mmol/L (ref 3.5–5.2)
Sodium: 140 mmol/L (ref 134–144)
Total Protein: 7.1 g/dL (ref 6.0–8.5)
eGFR: 55 mL/min/{1.73_m2} — ABNORMAL LOW (ref >59)

## 2021-08-26 DIAGNOSIS — J301 Allergic rhinitis due to pollen: Secondary | ICD-10-CM | POA: Diagnosis not present

## 2021-08-27 ENCOUNTER — Telehealth: Payer: Self-pay

## 2021-08-27 NOTE — Chronic Care Management (AMB) (Signed)
  Chronic Care Management Note  08/27/2021 Name: [name] MRN: [id number] DOB: Jan 17, 1971  [name] is a 51 y.o. year old female who is a primary care patient of Quay Burow, Claudina Lick, MD and is actively engaged with the care management team. I reached out to Karie Mainland by phone today to assist with a follow up visit with the Pharmacist  Follow up plan: Patient declines further follow up and engagement by the Pharm D . Appropriate care team members and provider have been notified via electronic communication.   Noreene Larsson, [place], [place], [place] [address] Direct Dial: [phone] [email or web address]: [email or web address]

## 2021-08-31 ENCOUNTER — Ambulatory Visit
Admission: RE | Admit: 2021-08-31 | Discharge: 2021-08-31 | Disposition: A | Discharge status: Home / Self Care | Payer: PPO | Source: Ambulatory Visit | Attending: Internal Medicine | Admitting: Internal Medicine

## 2021-08-31 DIAGNOSIS — Z1231 Encounter for screening mammogram for malignant neoplasm of breast: Secondary | ICD-10-CM | POA: Diagnosis not present

## 2021-09-01 ENCOUNTER — Telehealth: Payer: PPO

## 2021-09-01 DIAGNOSIS — J301 Allergic rhinitis due to pollen: Secondary | ICD-10-CM | POA: Diagnosis not present

## 2021-09-01 DIAGNOSIS — M25552 Pain in left hip: Secondary | ICD-10-CM | POA: Diagnosis not present

## 2021-09-02 ENCOUNTER — Other Ambulatory Visit (INDEPENDENT_AMBULATORY_CARE_PROVIDER_SITE_OTHER): Payer: Self-pay | Admitting: Family Medicine

## 2021-09-02 DIAGNOSIS — J301 Allergic rhinitis due to pollen: Secondary | ICD-10-CM | POA: Diagnosis not present

## 2021-09-02 DIAGNOSIS — F3289 Other specified depressive episodes: Secondary | ICD-10-CM

## 2021-09-02 NOTE — Telephone Encounter (Signed)
LAST APPOINTMENT DATE: 08/26/2021 NEXT APPOINTMENT DATE: 09/28/2021   [id number] [place] [facility] [id number]: [phone]: [phone] Patient is requesting a refill of the following medications: Requested Prescriptions   Pending Prescriptions Disp Refills   topiramate (TOPAMAX) 50 MG tablet 30 tablet 0    Sig: Take 1 tablet (50 mg total) by mouth daily.    Date last filled: 07/27/2021 for # 30 Previously prescribed by: Dr. BLeafy Ro Lab Results  Component Value Date   HGBA1C 5.5 04/27/2021   HGBA1C 5.7 (H) 12/31/2020   HGBA1C 5.8 (H) 08/13/2020   Lab Results  Component Value Date   LDLCALC 66 12/31/2020   CREATININE 1.20 (H) 08/24/2021   Lab Results  Component Value Date   VD25OH 35.1 04/27/2021   VD25OH 45.9 12/31/2020   VD25OH 34.8 08/13/2020    BP Readings from Last 3 Encounters:  08/24/21 94/68  07/27/21 116/82  06/29/21 118/79

## 2021-09-02 NOTE — Telephone Encounter (Signed)
Dr.Beasley 

## 2021-09-06 DIAGNOSIS — M1612 Unilateral primary osteoarthritis, left hip: Secondary | ICD-10-CM | POA: Diagnosis not present

## 2021-09-06 DIAGNOSIS — J301 Allergic rhinitis due to pollen: Secondary | ICD-10-CM | POA: Diagnosis not present

## 2021-09-06 MED ORDER — TOPIRAMATE 50 MG PO TABS: 50.0000 mg | ORAL_TABLET | Freq: Every day | ORAL | 0 refills | Status: DC

## 2021-09-06 NOTE — Progress Notes (Signed)
Chief Complaint:   OBESITY [name] is here to discuss her progress with her obesity treatment plan along with follow-up of her obesity related diagnoses. [name] is on keeping a food journal and adhering to recommended goals of 1200 calories and 75+ grams of protein daily and states she is following her eating plan approximately 50% of the time. [name] states she is walking for 30-45 minutes 3-4 times per week.  Today's visit was #: 68 Starting weight: 241 lbs Starting date: 09/28/2016 Today's weight: 219 lbs Today's date: 08/24/2021 Total lbs lost to date: 22 Total lbs lost since last in-office visit: 9  Interim History: [name] was sick with a GI bug since her last visit. She is feeling better now.her hunger is decreased and she is not always meeting her protein goals.   Subjective:   1. Type 2 diabetes mellitus with other specified complication, unspecified whether long term insulin use ([place]) Evalette is on Ozempic, and she is working on her diet and weight loss. She has had increased GI upset since her bout with gastroenteritis.   2. Irritable bowel syndrome with diarrhea [name] has had GI issues with continued diarrhea.   Assessment/Plan:   1. Type 2 diabetes mellitus with other specified complication, unspecified whether long term insulin use ([facility]) We will refill Ozempic for 1 month. Jonelle agreed to discontinue metformin.  - Semaglutide, 2 MG/DOSE, 8 MG/3ML SOPN; Inject 2 mg as directed once a week.  Dispense: 3 mL; Refill: 0  2. Irritable bowel syndrome with diarrhea We will check labs today, and will follow up at [facility] next visit. She will discontinue metformin and see if this helps, and she may need to start K+.  - CMP14+EGFR  3. Obesity, Current BMI 33.3 [name] is currently in the action stage of change. As such, her goal is to continue with weight loss efforts. She has agreed to keeping a food journal and adhering to recommended goals of 1200  calories and 75+ grams of protein daily.   Exercise goals: As is.  Behavioral modification strategies: increasing lean protein intake.  [name] has agreed to follow-up with our clinic in 4 weeks. She was informed of the importance of frequent follow-up visits to maximize her success with intensive lifestyle modifications for her multiple health conditions.   Objective:   Blood pressure 94/68, pulse 89, temperature 98.8 F (37.1 C), height _0  (1.727 m), weight 219 lb (99.3 kg), SpO2 99 %. Body mass index is 33.3 kg/m.  General: Cooperative, alert, well developed, in no acute distress. HEENT: Conjunctivae and lids unremarkable. Cardiovascular: Regular rhythm.  Lungs: Normal work of breathing. Neurologic: No focal deficits.   Lab Results  Component Value Date   CREATININE 1.20 (H) 08/24/2021   BUN 23 08/24/2021   NA 140 08/24/2021   K 4.3 08/24/2021   CL 104 08/24/2021   CO2 22 08/24/2021   Lab Results  Component Value Date   ALT 24 08/24/2021   AST 26 08/24/2021   ALKPHOS 92 08/24/2021   BILITOT <0.2 08/24/2021   Lab Results  Component Value Date   HGBA1C 5.5 04/27/2021   HGBA1C 5.7 (H) 12/31/2020   HGBA1C 5.8 (H) 08/13/2020   HGBA1C 5.9 04/28/2020   HGBA1C 5.5 11/11/2019   Lab Results  Component Value Date   INSULIN 28.5 (H) 04/27/2021   INSULIN 17.9 12/31/2020   INSULIN 14.2 08/13/2020   INSULIN 25.1 (H) 04/30/2020   INSULIN 16.4 11/11/2019   Lab Results  Component Value Date  TSH 2.590 12/31/2020   Lab Results  Component Value Date   CHOL 136 12/31/2020   HDL 53 12/31/2020   LDLCALC 66 12/31/2020   TRIG 89 12/31/2020   CHOLHDL 3 08/16/2016   Lab Results  Component Value Date   VD25OH 35.1 04/27/2021   VD25OH 45.9 12/31/2020   VD25OH 34.8 08/13/2020   Lab Results  Component Value Date   WBC 6.7 08/13/2020   HGB 13.0 08/13/2020   HCT 38.8 08/13/2020   MCV 84 08/13/2020   PLT 266 08/13/2020   No results found for: IRON, TIBC,  FERRITIN  Obesity Behavioral Intervention:   Approximately 15 minutes were spent on the discussion below.  ASK: We discussed the diagnosis of obesity with [name] today and Merla agreed to give [place] permission to discuss obesity behavioral modification therapy today.  ASSESS: [name] has the diagnosis of obesity and her BMI today is 33.3. [name] is in the action stage of change.   ADVISE: [name] was educated on the multiple health risks of obesity as well as the benefit of weight loss to improve her health. She was advised of the need for long term treatment and the importance of lifestyle modifications to improve her current health and to decrease her risk of future health problems.  AGREE: Multiple dietary modification options and treatment options were discussed and [name] agreed to follow the recommendations documented in the above note.  ARRANGE: [name] was educated on the importance of frequent visits to treat obesity as outlined per CMS and USPSTF guidelines and agreed to schedule her next follow up appointment today.  Attestation Statements:   Reviewed by clinician on day of visit: allergies, medications, problem list, medical history, surgical history, family history, social history, and previous encounter notes.   I, Trixie Dredge, am acting as transcriptionist for Dennard Nip, MD.  I have reviewed the above documentation for accuracy and completeness, and I agree with the above. -  Dennard Nip, MD

## 2021-09-09 DIAGNOSIS — J301 Allergic rhinitis due to pollen: Secondary | ICD-10-CM | POA: Diagnosis not present

## 2021-09-13 DIAGNOSIS — J301 Allergic rhinitis due to pollen: Secondary | ICD-10-CM | POA: Diagnosis not present

## 2021-09-16 DIAGNOSIS — J301 Allergic rhinitis due to pollen: Secondary | ICD-10-CM | POA: Diagnosis not present

## 2021-09-19 ENCOUNTER — Other Ambulatory Visit: Payer: Self-pay | Admitting: Internal Medicine

## 2021-09-19 DIAGNOSIS — J309 Allergic rhinitis, unspecified: Secondary | ICD-10-CM

## 2021-09-20 DIAGNOSIS — N289 Disorder of kidney and ureter, unspecified: Secondary | ICD-10-CM | POA: Diagnosis not present

## 2021-09-20 DIAGNOSIS — J301 Allergic rhinitis due to pollen: Secondary | ICD-10-CM | POA: Diagnosis not present

## 2021-09-22 ENCOUNTER — Other Ambulatory Visit (INDEPENDENT_AMBULATORY_CARE_PROVIDER_SITE_OTHER): Payer: Self-pay | Admitting: Family Medicine

## 2021-09-22 DIAGNOSIS — E1169 Type 2 diabetes mellitus with other specified complication: Secondary | ICD-10-CM

## 2021-09-23 DIAGNOSIS — J301 Allergic rhinitis due to pollen: Secondary | ICD-10-CM | POA: Diagnosis not present

## 2021-09-24 DIAGNOSIS — J301 Allergic rhinitis due to pollen: Secondary | ICD-10-CM | POA: Diagnosis not present

## 2021-09-27 DIAGNOSIS — J301 Allergic rhinitis due to pollen: Secondary | ICD-10-CM | POA: Diagnosis not present

## 2021-09-28 ENCOUNTER — Ambulatory Visit (INDEPENDENT_AMBULATORY_CARE_PROVIDER_SITE_OTHER): Payer: PPO | Admitting: Family Medicine

## 2021-09-28 ENCOUNTER — Encounter (INDEPENDENT_AMBULATORY_CARE_PROVIDER_SITE_OTHER): Payer: Self-pay | Admitting: Nurse Practitioner

## 2021-09-28 ENCOUNTER — Other Ambulatory Visit (INDEPENDENT_AMBULATORY_CARE_PROVIDER_SITE_OTHER): Payer: Self-pay | Admitting: Family Medicine

## 2021-09-28 ENCOUNTER — Ambulatory Visit (INDEPENDENT_AMBULATORY_CARE_PROVIDER_SITE_OTHER): Payer: PPO | Admitting: Nurse Practitioner

## 2021-09-28 ENCOUNTER — Other Ambulatory Visit (INDEPENDENT_AMBULATORY_CARE_PROVIDER_SITE_OTHER): Payer: Self-pay | Admitting: Nurse Practitioner

## 2021-09-28 VITALS — BP 113/82 | HR 91 | Temp 100.6°F | Ht 68.0 in | Wt 221.0 lb

## 2021-09-28 DIAGNOSIS — R7303 Prediabetes: Secondary | ICD-10-CM

## 2021-09-28 DIAGNOSIS — Z6833 Body mass index (BMI) 33.0-33.9, adult: Secondary | ICD-10-CM | POA: Diagnosis not present

## 2021-09-28 DIAGNOSIS — R748 Abnormal levels of other serum enzymes: Secondary | ICD-10-CM | POA: Diagnosis not present

## 2021-09-28 DIAGNOSIS — E88819 Insulin resistance, unspecified: Secondary | ICD-10-CM

## 2021-09-28 DIAGNOSIS — K5909 Other constipation: Secondary | ICD-10-CM

## 2021-09-28 DIAGNOSIS — E66812 Obesity, class 2: Secondary | ICD-10-CM

## 2021-09-28 DIAGNOSIS — F3289 Other specified depressive episodes: Secondary | ICD-10-CM

## 2021-09-28 DIAGNOSIS — Z7984 Long term (current) use of oral hypoglycemic drugs: Secondary | ICD-10-CM | POA: Diagnosis not present

## 2021-09-28 DIAGNOSIS — E669 Obesity, unspecified: Secondary | ICD-10-CM

## 2021-09-28 DIAGNOSIS — Z7985 Long-term (current) use of injectable non-insulin antidiabetic drugs: Secondary | ICD-10-CM

## 2021-09-28 DIAGNOSIS — E8881 Metabolic syndrome: Secondary | ICD-10-CM | POA: Diagnosis not present

## 2021-09-28 MED ORDER — TOPIRAMATE 50 MG PO TABS: 50.0000 mg | ORAL_TABLET | Freq: Every day | ORAL | 0 refills | Status: DC

## 2021-09-28 MED ORDER — METFORMIN HCL 500 MG PO TABS: 500.0000 mg | ORAL_TABLET | Freq: Every day | ORAL | 0 refills | Status: DC

## 2021-09-28 MED ORDER — SEMAGLUTIDE (2 MG/DOSE) 8 MG/3ML ~~LOC~~ SOPN: 2.0000 mg | PEN_INJECTOR | SUBCUTANEOUS | 0 refills | Status: DC

## 2021-09-29 NOTE — Progress Notes (Unsigned)
Chief Complaint:   OBESITY [name] is here to discuss her progress with her obesity treatment plan along with follow-up of her obesity related diagnoses. [name] is on keeping a food journal and adhering to recommended goals of 1200 calories and 75+ grams of protein daily and states she is following her eating plan approximately 80% of the time. Kenetra states she is doing pool walking 4 days per week.  Today's visit was #: 31 Starting weight: 241 lbs Starting date: 09/28/2016 Today's weight: 221 lbs Today's date: 09/28/2021 Total lbs lost to date: 20 Total lbs lost since last in-office visit: 0  Interim History: [name] is frustrated with her weight gain.  She feels this is due to struggling with constipation.  She plans to have left hip replacement surgery in September.  She is drinking water daily, and denies sugary drinks.  She leaves on Monday to go on vacation.  Subjective:   1. Other constipation [name] stopped metformin after her last visit due to some abdominal cramping.  She has been struggling with constipation since stopping metformin.  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  Her last BM was Saturday.  2. Insulin resistance [name] is taking Ozempic 2 mg, and she notes side effects of constipation.  She denies polyphagia or cravings.  3. Elevated creatine kinase [name]'s CMP on 08/24/2021 showed creatinine elevated at 1.20.  I discussed labs with the patient today.  4. Other depression with emotional eating [name] is taking Topamax 50 mg daily, and she denies side effects.  Assessment/Plan:   1. Other constipation Miralax and colace PRN.  Increase water, vegetables and activity/walking.   2. Insulin resistance [name] will continue Ozempic at 2 mg weekly, and we will refill for 1 month; and we will refill metformin 500 mg once daily for 1 month.  - Semaglutide, 2 MG/DOSE, 8 MG/3ML SOPN; Inject 2 mg as directed once a week.  Dispense: 3 mL;  Refill: 0 - metFORMIN (GLUCOPHAGE) 500 MG tablet; Take 1 tablet (500 mg total) by mouth daily with breakfast.  Dispense: 30 tablet; Refill: 0  3. Elevated creatine kinase BMP was reviewed with the patient from 09/20/2021, creatinine at 1.0.  4. Other depression with emotional eating We will refill Topamax 50 mg daily for 1 month.  Side effects were discussed.  - topiramate (TOPAMAX) 50 MG tablet; Take 1 tablet (50 mg total) by mouth daily.  Dispense: 30 tablet; Refill: 0  5. Obesity, Current BMI 33.7 [name] is currently in the action stage of change. As such, her goal is to continue with weight loss efforts. She has agreed to keeping a food journal and adhering to recommended goals of 1200 calories and 75+ grams of protein daily.   Exercise goals: As is.   Behavioral modification strategies: increasing lean protein intake, increasing water intake, and planning for success.  [name] has agreed to follow-up with our clinic in 4 weeks. She was informed of the importance of frequent follow-up visits to maximize her success with intensive lifestyle modifications for her multiple health conditions.   Objective:   Blood pressure 113/82, pulse 91, temperature (!) 100.6 F (38.1 C), height '5\' 8"'$  (1.727 m), weight 221 lb (100.2 kg), SpO2 99 %. Body mass index is 33.6 kg/m.  General: Cooperative, alert, well developed, in no acute distress. HEENT: Conjunctivae and lids unremarkable. Cardiovascular: Regular rhythm.  Lungs: Normal work of breathing. Neurologic: No focal deficits.   Lab Results  Component Value Date   CREATININE 1.20 (  H) 08/24/2021   BUN 23 08/24/2021   NA 140 08/24/2021   K 4.3 08/24/2021   CL 104 08/24/2021   CO2 22 08/24/2021   Lab Results  Component Value Date   ALT 24 08/24/2021   AST 26 08/24/2021   ALKPHOS 92 08/24/2021   BILITOT <0.2 08/24/2021   Lab Results  Component Value Date   HGBA1C 5.5 04/27/2021   HGBA1C 5.7 (H) 12/31/2020   HGBA1C 5.8 (H)  08/13/2020   HGBA1C 5.9 04/28/2020   HGBA1C 5.5 11/11/2019   Lab Results  Component Value Date   INSULIN 28.5 (H) 04/27/2021   INSULIN 17.9 12/31/2020   INSULIN 14.2 08/13/2020   INSULIN 25.1 (H) 04/30/2020   INSULIN 16.4 11/11/2019   Lab Results  Component Value Date   TSH 2.590 12/31/2020   Lab Results  Component Value Date   CHOL 136 12/31/2020   HDL 53 12/31/2020   LDLCALC 66 12/31/2020   TRIG 89 12/31/2020   CHOLHDL 3 08/16/2016   Lab Results  Component Value Date   VD25OH 35.1 04/27/2021   VD25OH 45.9 12/31/2020   VD25OH 34.8 08/13/2020   Lab Results  Component Value Date   WBC 6.7 08/13/2020   HGB 13.0 08/13/2020   HCT 38.8 08/13/2020   MCV 84 08/13/2020   PLT 266 08/13/2020   No results found for: "IRON", "TIBC", "FERRITIN"  Attestation Statements:   Reviewed by clinician on day of visit: allergies, medications, problem list, medical history, surgical history, family history, social history, and previous encounter notes.   Wilhemena Durie, am acting as Location manager for [employer], FNP-C.  I have reviewed the above documentation for accuracy and completeness, and I agree with the above. Everardo Pacific, FNP

## 2021-09-30 DIAGNOSIS — J301 Allergic rhinitis due to pollen: Secondary | ICD-10-CM | POA: Diagnosis not present

## 2021-10-10 ENCOUNTER — Other Ambulatory Visit: Payer: Self-pay | Admitting: Internal Medicine

## 2021-10-11 DIAGNOSIS — J301 Allergic rhinitis due to pollen: Secondary | ICD-10-CM | POA: Diagnosis not present

## 2021-10-14 DIAGNOSIS — J301 Allergic rhinitis due to pollen: Secondary | ICD-10-CM | POA: Diagnosis not present

## 2021-10-18 DIAGNOSIS — J301 Allergic rhinitis due to pollen: Secondary | ICD-10-CM | POA: Diagnosis not present

## 2021-10-19 ENCOUNTER — Telehealth: Payer: Self-pay | Admitting: Internal Medicine

## 2021-10-19 NOTE — Telephone Encounter (Signed)
N/A unable to leave a message for patient to call back to schedule Medicare Annual Wellness Visit   Last AWV  10/26/20  Please schedule at anytime with [facility] if patient calls the office back.      Any questions, please call me at [phone]

## 2021-10-21 ENCOUNTER — Other Ambulatory Visit: Payer: Self-pay | Admitting: Internal Medicine

## 2021-10-21 DIAGNOSIS — J301 Allergic rhinitis due to pollen: Secondary | ICD-10-CM | POA: Diagnosis not present

## 2021-10-22 DIAGNOSIS — J301 Allergic rhinitis due to pollen: Secondary | ICD-10-CM | POA: Diagnosis not present

## 2021-10-24 ENCOUNTER — Other Ambulatory Visit (INDEPENDENT_AMBULATORY_CARE_PROVIDER_SITE_OTHER): Payer: Self-pay | Admitting: Nurse Practitioner

## 2021-10-24 DIAGNOSIS — F3289 Other specified depressive episodes: Secondary | ICD-10-CM

## 2021-10-25 DIAGNOSIS — J301 Allergic rhinitis due to pollen: Secondary | ICD-10-CM | POA: Diagnosis not present

## 2021-10-28 ENCOUNTER — Encounter: Payer: Self-pay | Admitting: Internal Medicine

## 2021-10-28 DIAGNOSIS — J301 Allergic rhinitis due to pollen: Secondary | ICD-10-CM | POA: Diagnosis not present

## 2021-10-28 NOTE — Progress Notes (Signed)
Subjective:    Patient ID: [name], female    DOB: November 11, 1970, 51 y.o.   MRN: [id number]     HPI [name] is here for follow up of her chronic medical problems, including htn, prediabetes, GERD, asthma, anxiety, depression  Going to [facility] clinic.  Hip replacement in September.  Shoulder replacement in December  Doing allergy injections twice a week.    Medications and allergies reviewed with patient and updated if appropriate.  Current Outpatient Medications on File Prior to Visit  Medication Sig Dispense Refill   albuterol (VENTOLIN HFA) 108 (90 Base) MCG/ACT inhaler TAKE 2 PUFFS BY MOUTH EVERY 6 HOURS AS NEEDED FOR WHEEZE OR SHORTNESS OF BREATH (Patient taking differently: Inhale 2 puffs into the lungs every 6 (six) hours as needed for shortness of breath or wheezing.) 8.5 g 5   Blood Glucose Monitoring Suppl (ONE TOUCH ULTRA 2) w/Device KIT OneTouch Ultra2 Meter     celecoxib (CELEBREX) 200 MG capsule Take 200 mg by mouth 2 (two) times daily.     citalopram (CELEXA) 20 MG tablet TAKE 1 TABLET BY MOUTH EVERY DAY 90 tablet 2   cyclobenzaprine (FLEXERIL) 10 MG tablet Take 10 mg by mouth 2 (two) times daily as needed.     diphenhydramine-acetaminophen (TYLENOL PM) 25-500 MG TABS tablet Take 2 tablets by mouth at bedtime.     EPINEPHrine 0.3 mg/0.3 mL IJ SOAJ injection See admin instructions.     famotidine (PEPCID) 20 MG tablet TAKE 1 TABLET BY MOUTH EVERYDAY AT BEDTIME 90 tablet 1   fluticasone (FLONASE) 50 MCG/ACT nasal spray Place 1 spray into both nostrils 2 (two) times daily.     glucose blood (ONETOUCH ULTRA) test strip TEST BLOOD SUGARS ONCE A DAY 100 strip 0   hydrochlorothiazide (MICROZIDE) 12.5 MG capsule TAKE 1 CAPSULE BY MOUTH EVERY DAY 90 capsule 1   hydroxychloroquine (PLAQUENIL) 200 MG tablet Take 200 mg by mouth 2 (two) times daily.     Insulin Pen Needle (BD PEN NEEDLE NANO U/F) 32G X 4 MM MISC 1 each by Other route daily. 100 each 0   levocetirizine  (XYZAL) 5 MG tablet Take 1 tablet (5 mg total) by mouth every evening. Annual appt due in July must see provider for future refills 90 tablet 0   lisinopril (ZESTRIL) 40 MG tablet Take 1 tablet (40 mg total) by mouth daily. Overdue for Annual appt must see provider for future refills 30 tablet 0   metFORMIN (GLUCOPHAGE) 500 MG tablet Take 1 tablet (500 mg total) by mouth daily with breakfast. 30 tablet 0   OneTouch Delica Lancets 07E MISC 1 each by Does not apply route 2 (two) times daily. 100 each 0   Semaglutide, 2 MG/DOSE, 8 MG/3ML SOPN Inject 2 mg as directed once a week. 3 mL 0   topiramate (TOPAMAX) 50 MG tablet Take 1 tablet (50 mg total) by mouth daily. 30 tablet 0   No current facility-administered medications on file prior to visit.     Review of Systems  Constitutional:  Negative for fever.  Respiratory:  Negative for cough, shortness of breath and wheezing.   Cardiovascular:  Negative for chest pain, palpitations and leg swelling.  Musculoskeletal:  Positive for arthralgias and back pain.  Neurological:  Negative for light-headedness and headaches.       Objective:   Vitals:   10/29/21 1246  BP: 120/78  Pulse: 66  Temp: 98.2 F (36.8 C)  SpO2: 99%  BP Readings from Last 3 Encounters:  10/29/21 120/78  09/28/21 113/82  08/24/21 94/68   Wt Readings from Last 3 Encounters:  10/29/21 226 lb (102.5 kg)  09/28/21 221 lb (100.2 kg)  08/24/21 219 lb (99.3 kg)   Body mass index is 34.36 kg/m.    Physical Exam Constitutional:      General: She is not in acute distress.    Appearance: Normal appearance.  HENT:     Head: Normocephalic and atraumatic.  Eyes:     Conjunctiva/sclera: Conjunctivae normal.  Cardiovascular:     Rate and Rhythm: Normal rate and regular rhythm.     Heart sounds: Normal heart sounds. No murmur heard. Pulmonary:     Effort: Pulmonary effort is normal. No respiratory distress.     Breath sounds: Normal breath sounds. No wheezing.   Musculoskeletal:     Cervical back: Neck supple.     Right lower leg: No edema.     Left lower leg: No edema.  Lymphadenopathy:     Cervical: No cervical adenopathy.  Skin:    General: Skin is warm and dry.     Findings: No rash.  Neurological:     Mental Status: She is alert. Mental status is at baseline.  Psychiatric:        Mood and Affect: Mood normal.        Behavior: Behavior normal.        Lab Results  Component Value Date   WBC 6.7 08/13/2020   HGB 13.0 08/13/2020   HCT 38.8 08/13/2020   PLT 266 08/13/2020   GLUCOSE 93 08/24/2021   CHOL 136 12/31/2020   TRIG 89 12/31/2020   HDL 53 12/31/2020   LDLCALC 66 12/31/2020   ALT 24 08/24/2021   AST 26 08/24/2021   NA 140 08/24/2021   K 4.3 08/24/2021   CL 104 08/24/2021   CREATININE 1.20 (H) 08/24/2021   BUN 23 08/24/2021   CO2 22 08/24/2021   TSH 2.590 12/31/2020   HGBA1C 5.5 04/27/2021     Assessment & Plan:    See Problem List for Assessment and Plan of chronic medical problems.

## 2021-10-28 NOTE — Patient Instructions (Addendum)
      Medications changes include :   None     Return in about 6 months (around 05/01/2022) for Physical Exam.

## 2021-10-29 ENCOUNTER — Ambulatory Visit (INDEPENDENT_AMBULATORY_CARE_PROVIDER_SITE_OTHER): Payer: PPO | Admitting: Internal Medicine

## 2021-10-29 VITALS — BP 120/78 | HR 66 | Temp 98.2°F | Ht 68.0 in | Wt 226.0 lb

## 2021-10-29 DIAGNOSIS — F419 Anxiety disorder, unspecified: Secondary | ICD-10-CM | POA: Diagnosis not present

## 2021-10-29 DIAGNOSIS — F3289 Other specified depressive episodes: Secondary | ICD-10-CM

## 2021-10-29 DIAGNOSIS — R7303 Prediabetes: Secondary | ICD-10-CM | POA: Diagnosis not present

## 2021-10-29 DIAGNOSIS — J4520 Mild intermittent asthma, uncomplicated: Secondary | ICD-10-CM

## 2021-10-29 DIAGNOSIS — I10 Essential (primary) hypertension: Secondary | ICD-10-CM | POA: Diagnosis not present

## 2021-10-29 DIAGNOSIS — K219 Gastro-esophageal reflux disease without esophagitis: Secondary | ICD-10-CM | POA: Diagnosis not present

## 2021-10-29 NOTE — Assessment & Plan Note (Signed)
Chronic Controlled, Stable Continue Celexa 20 mg daily 

## 2021-10-29 NOTE — Assessment & Plan Note (Signed)
Chronic Mild, intermediate Albuterol inhaler as needed

## 2021-10-29 NOTE — Assessment & Plan Note (Signed)
Chronic Check a1c Low sugar / carb diet Stressed regular exercise  

## 2021-10-29 NOTE — Assessment & Plan Note (Signed)
Chronic GERD controlled Continue Pepcid 20 mg daily

## 2021-10-29 NOTE — Assessment & Plan Note (Signed)
Chronic Blood pressure well controlled CMP Continue HCTZ 12.5 mg daily, lisinopril 40 mg daily

## 2021-10-29 NOTE — Assessment & Plan Note (Addendum)
Chronic Controlled, Stable - denies depression Continue Celexa 20 mg daily

## 2021-11-02 ENCOUNTER — Ambulatory Visit (INDEPENDENT_AMBULATORY_CARE_PROVIDER_SITE_OTHER): Payer: PPO | Admitting: Family Medicine

## 2021-11-02 ENCOUNTER — Encounter (INDEPENDENT_AMBULATORY_CARE_PROVIDER_SITE_OTHER): Payer: Self-pay | Admitting: Family Medicine

## 2021-11-02 VITALS — BP 105/61 | HR 82 | Temp 99.1°F | Ht 68.0 in | Wt 225.0 lb

## 2021-11-02 DIAGNOSIS — Z6834 Body mass index (BMI) 34.0-34.9, adult: Secondary | ICD-10-CM | POA: Diagnosis not present

## 2021-11-02 DIAGNOSIS — E669 Obesity, unspecified: Secondary | ICD-10-CM

## 2021-11-02 DIAGNOSIS — E1169 Type 2 diabetes mellitus with other specified complication: Secondary | ICD-10-CM

## 2021-11-02 DIAGNOSIS — E66812 Obesity, class 2: Secondary | ICD-10-CM

## 2021-11-02 DIAGNOSIS — Z7984 Long term (current) use of oral hypoglycemic drugs: Secondary | ICD-10-CM

## 2021-11-02 MED ORDER — SEMAGLUTIDE (2 MG/DOSE) 8 MG/3ML ~~LOC~~ SOPN: 2.0000 mg | PEN_INJECTOR | SUBCUTANEOUS | 0 refills | Status: DC

## 2021-11-02 MED ORDER — METFORMIN HCL 500 MG PO TABS: 500.0000 mg | ORAL_TABLET | Freq: Two times a day (BID) | ORAL | 0 refills | Status: DC

## 2021-11-03 DIAGNOSIS — J301 Allergic rhinitis due to pollen: Secondary | ICD-10-CM | POA: Diagnosis not present

## 2021-11-04 DIAGNOSIS — J301 Allergic rhinitis due to pollen: Secondary | ICD-10-CM | POA: Diagnosis not present

## 2021-11-08 DIAGNOSIS — J301 Allergic rhinitis due to pollen: Secondary | ICD-10-CM | POA: Diagnosis not present

## 2021-11-10 ENCOUNTER — Encounter (INDEPENDENT_AMBULATORY_CARE_PROVIDER_SITE_OTHER): Payer: Self-pay

## 2021-11-10 ENCOUNTER — Other Ambulatory Visit (INDEPENDENT_AMBULATORY_CARE_PROVIDER_SITE_OTHER): Payer: Self-pay | Admitting: Nurse Practitioner

## 2021-11-10 DIAGNOSIS — F3289 Other specified depressive episodes: Secondary | ICD-10-CM

## 2021-11-11 ENCOUNTER — Telehealth (INDEPENDENT_AMBULATORY_CARE_PROVIDER_SITE_OTHER): Payer: Self-pay | Admitting: Family Medicine

## 2021-11-11 DIAGNOSIS — F3289 Other specified depressive episodes: Secondary | ICD-10-CM

## 2021-11-11 DIAGNOSIS — J301 Allergic rhinitis due to pollen: Secondary | ICD-10-CM | POA: Diagnosis not present

## 2021-11-11 MED ORDER — TOPIRAMATE 50 MG PO TABS: 50.0000 mg | ORAL_TABLET | Freq: Every day | ORAL | 0 refills | Status: DC

## 2021-11-11 NOTE — Telephone Encounter (Signed)
Ok to refill Topiramate?  LAST APPOINTMENT DATE: 11/02/2021 NEXT APPOINTMENT DATE: 12/09/2021   [id number] [place] [facility] [id number]: [phone]: [phone] Patient is requesting a refill of the following medications: Requested Prescriptions    No prescriptions requested or ordered in this encounter    Date last filled: 09/2021 [facility] Lab Results  Component Value Date   HGBA1C 5.5 04/27/2021   HGBA1C 5.7 (H) 12/31/2020   HGBA1C 5.8 (H) 08/13/2020   Lab Results  Component Value Date   LDLCALC 66 12/31/2020   CREATININE 1.20 (H) 08/24/2021   Lab Results  Component Value Date   VD25OH 35.1 04/27/2021   VD25OH 45.9 12/31/2020   VD25OH 34.8 08/13/2020    BP Readings from Last 3 Encounters:  11/02/21 105/61  10/29/21 120/78  09/28/21 113/82

## 2021-11-11 NOTE — Telephone Encounter (Signed)
Ok x 1

## 2021-11-11 NOTE — Telephone Encounter (Signed)
Pt is completely out of topiramate (TOPAMAX) 50 MG tablet [[id number]]. Pt states that she needs a refill today. Pt states that she was seen in the office within the last two weeks. Please call at cell number.

## 2021-11-14 NOTE — Progress Notes (Unsigned)
Chief Complaint:   OBESITY [name] is here to discuss her progress with her obesity treatment plan along with follow-up of her obesity related diagnoses. [name] is on keeping a food journal and adhering to recommended goals of 1200 calories and 75+ grams of protein daily and states she is following her eating plan approximately 65% of the time. [name] states she is swimming 7 times per week.    Today's visit was #: 24 Starting weight: 241 lbs Starting date: 09/28/2016 Today's weight: 225 lbs Today's date: 11/02/2021 Total lbs lost to date: 16 Total lbs lost since last in-office visit: 0  Interim History: [name] been on vacation and she has been on prednisone for her hip pain.  She is getting ready to have hip replacement surgery.  She is retaining some fluid today.  Subjective:   1. Type 2 diabetes mellitus with other specified complication, unspecified whether long term insulin use ([place]) [name] restarted metformin and she is doing well, but she feels she did better on the twice daily dose.  Assessment/Plan:   1. Type 2 diabetes mellitus with other specified complication, unspecified whether long term insulin use ([place]) [name] agreed to increase metformin to 500 mg twice daily, and we will refill for 1 month; we will refill Ozempic 2 mg once weekly for 1 month.  - metFORMIN (GLUCOPHAGE) 500 MG tablet; Take 1 tablet (500 mg total) by mouth 2 (two) times daily with a meal.  Dispense: 60 tablet; Refill: 0 - Semaglutide, 2 MG/DOSE, 8 MG/3ML SOPN; Inject 2 mg as directed once a week.  Dispense: 3 mL; Refill: 0  2. Obesity, Current BMI 34.3 [name] is currently in the action stage of change. As such, her goal is to continue with weight loss efforts. She has agreed to keeping a food journal and adhering to recommended goals of 1200 calories and 75+ grams of protein daily.   Exercise goals: As is.   Behavioral modification strategies: increasing lean protein  intake.  [name] has agreed to follow-up with our clinic in 5 weeks. She was informed of the importance of frequent follow-up visits to maximize her success with intensive lifestyle modifications for her multiple health conditions.   Objective:   Blood pressure 105/61, pulse 82, temperature 99.1 F (37.3 C), height '5\' 8"'$  (1.727 m), weight 225 lb (102.1 kg), SpO2 100 %. Body mass index is 34.21 kg/m.  General: Cooperative, alert, well developed, in no acute distress. HEENT: Conjunctivae and lids unremarkable. Cardiovascular: Regular rhythm.  Lungs: Normal work of breathing. Neurologic: No focal deficits.   Lab Results  Component Value Date   CREATININE 1.20 (H) 08/24/2021   BUN 23 08/24/2021   NA 140 08/24/2021   K 4.3 08/24/2021   CL 104 08/24/2021   CO2 22 08/24/2021   Lab Results  Component Value Date   ALT 24 08/24/2021   AST 26 08/24/2021   ALKPHOS 92 08/24/2021   BILITOT <0.2 08/24/2021   Lab Results  Component Value Date   HGBA1C 5.5 04/27/2021   HGBA1C 5.7 (H) 12/31/2020   HGBA1C 5.8 (H) 08/13/2020   HGBA1C 5.9 04/28/2020   HGBA1C 5.5 11/11/2019   Lab Results  Component Value Date   INSULIN 28.5 (H) 04/27/2021   INSULIN 17.9 12/31/2020   INSULIN 14.2 08/13/2020   INSULIN 25.1 (H) 04/30/2020   INSULIN 16.4 11/11/2019   Lab Results  Component Value Date   TSH 2.590 12/31/2020   Lab Results  Component Value Date   CHOL 136  12/31/2020   HDL 53 12/31/2020   LDLCALC 66 12/31/2020   TRIG 89 12/31/2020   CHOLHDL 3 08/16/2016   Lab Results  Component Value Date   VD25OH 35.1 04/27/2021   VD25OH 45.9 12/31/2020   VD25OH 34.8 08/13/2020   Lab Results  Component Value Date   WBC 6.7 08/13/2020   HGB 13.0 08/13/2020   HCT 38.8 08/13/2020   MCV 84 08/13/2020   PLT 266 08/13/2020   No results found for: "IRON", "TIBC", "FERRITIN"  Attestation Statements:   Reviewed by clinician on day of visit: allergies, medications, problem list, medical  history, surgical history, family history, social history, and previous encounter notes.  Time spent on visit including pre-visit chart review and post-visit care and charting was 30 minutes.   I, Trixie Dredge, am acting as transcriptionist for Dennard Nip, MD.  I have reviewed the above documentation for accuracy and completeness, and I agree with the above. -  Dennard Nip, MD

## 2021-11-16 ENCOUNTER — Other Ambulatory Visit: Payer: Self-pay | Admitting: Internal Medicine

## 2021-11-18 DIAGNOSIS — J301 Allergic rhinitis due to pollen: Secondary | ICD-10-CM | POA: Diagnosis not present

## 2021-11-29 DIAGNOSIS — J301 Allergic rhinitis due to pollen: Secondary | ICD-10-CM | POA: Diagnosis not present

## 2021-12-03 ENCOUNTER — Other Ambulatory Visit (INDEPENDENT_AMBULATORY_CARE_PROVIDER_SITE_OTHER): Payer: Self-pay | Admitting: Family Medicine

## 2021-12-03 DIAGNOSIS — E1169 Type 2 diabetes mellitus with other specified complication: Secondary | ICD-10-CM

## 2021-12-07 DIAGNOSIS — J301 Allergic rhinitis due to pollen: Secondary | ICD-10-CM | POA: Diagnosis not present

## 2021-12-08 DIAGNOSIS — M1612 Unilateral primary osteoarthritis, left hip: Secondary | ICD-10-CM | POA: Diagnosis not present

## 2021-12-09 ENCOUNTER — Ambulatory Visit (INDEPENDENT_AMBULATORY_CARE_PROVIDER_SITE_OTHER): Payer: PPO | Admitting: Family Medicine

## 2021-12-09 ENCOUNTER — Encounter (INDEPENDENT_AMBULATORY_CARE_PROVIDER_SITE_OTHER): Payer: Self-pay | Admitting: Family Medicine

## 2021-12-09 VITALS — BP 122/71 | HR 80 | Temp 98.4°F | Ht 68.0 in | Wt 227.0 lb

## 2021-12-09 DIAGNOSIS — E669 Obesity, unspecified: Secondary | ICD-10-CM

## 2021-12-09 DIAGNOSIS — E782 Mixed hyperlipidemia: Secondary | ICD-10-CM | POA: Diagnosis not present

## 2021-12-09 DIAGNOSIS — N2889 Other specified disorders of kidney and ureter: Secondary | ICD-10-CM | POA: Insufficient documentation

## 2021-12-09 DIAGNOSIS — E559 Vitamin D deficiency, unspecified: Secondary | ICD-10-CM | POA: Diagnosis not present

## 2021-12-09 DIAGNOSIS — F3289 Other specified depressive episodes: Secondary | ICD-10-CM

## 2021-12-09 DIAGNOSIS — Z6834 Body mass index (BMI) 34.0-34.9, adult: Secondary | ICD-10-CM | POA: Diagnosis not present

## 2021-12-09 DIAGNOSIS — E1169 Type 2 diabetes mellitus with other specified complication: Secondary | ICD-10-CM

## 2021-12-09 DIAGNOSIS — N1831 Chronic kidney disease, stage 3a: Secondary | ICD-10-CM | POA: Diagnosis not present

## 2021-12-09 DIAGNOSIS — Z7984 Long term (current) use of oral hypoglycemic drugs: Secondary | ICD-10-CM | POA: Diagnosis not present

## 2021-12-09 MED ORDER — TOPIRAMATE 50 MG PO TABS: 50.0000 mg | ORAL_TABLET | Freq: Every day | ORAL | 0 refills | Status: DC

## 2021-12-09 MED ORDER — SEMAGLUTIDE (2 MG/DOSE) 8 MG/3ML ~~LOC~~ SOPN: 2.0000 mg | PEN_INJECTOR | SUBCUTANEOUS | 0 refills | Status: DC

## 2021-12-09 MED ORDER — METFORMIN HCL 500 MG PO TABS: 500.0000 mg | ORAL_TABLET | Freq: Two times a day (BID) | ORAL | 0 refills | Status: DC

## 2021-12-10 LAB — LIPID PANEL WITH LDL/HDL RATIO
Cholesterol, Total: 144 mg/dL (ref 100–199)
HDL: 55 mg/dL (ref >39)
LDL Chol Calc (NIH): 74 mg/dL (ref 0–99)
LDL/HDL Ratio: 1.3 ratio (ref 0.0–3.2)
Triglycerides: 78 mg/dL (ref 0–149)
VLDL Cholesterol Cal: 15 mg/dL (ref 5–40)

## 2021-12-10 LAB — CMP14+EGFR
ALT: 21 IU/L (ref 0–32)
AST: 20 IU/L (ref 0–40)
Albumin/Globulin Ratio: 1.8 (ref 1.2–2.2)
Albumin: 4.4 g/dL (ref 3.8–4.9)
Alkaline Phosphatase: 102 IU/L (ref 44–121)
BUN/Creatinine Ratio: 25 — ABNORMAL HIGH (ref 9–23)
BUN: 22 mg/dL (ref 6–24)
Bilirubin Total: 0.2 mg/dL (ref 0.0–1.2)
CO2: 20 mmol/L (ref 20–29)
Calcium: 9.2 mg/dL (ref 8.7–10.2)
Chloride: 105 mmol/L (ref 96–106)
Creatinine, Ser: 0.89 mg/dL (ref 0.57–1.00)
Globulin, Total: 2.5 g/dL (ref 1.5–4.5)
Glucose: 76 mg/dL (ref 70–99)
Potassium: 4.3 mmol/L (ref 3.5–5.2)
Sodium: 142 mmol/L (ref 134–144)
Total Protein: 6.9 g/dL (ref 6.0–8.5)
eGFR: 78 mL/min/{1.73_m2} (ref >59)

## 2021-12-10 LAB — HEMOGLOBIN A1C
Est. average glucose Bld gHb Est-mCnc: 111 mg/dL
Hgb A1c MFr Bld: 5.5 % (ref 4.8–5.6)

## 2021-12-10 LAB — CBC WITH DIFFERENTIAL/PLATELET
Basophils Absolute: 0.1 10*3/uL (ref 0.0–0.2)
Basos: 1 %
EOS (ABSOLUTE): 0.2 10*3/uL (ref 0.0–0.4)
Eos: 3 %
Hematocrit: 37.1 % (ref 34.0–46.6)
Hemoglobin: 12.1 g/dL (ref 11.1–15.9)
Immature Grans (Abs): 0 10*3/uL (ref 0.0–0.1)
Immature Granulocytes: 1 %
Lymphocytes Absolute: 2.1 10*3/uL (ref 0.7–3.1)
Lymphs: 32 %
MCH: 28.3 pg (ref 26.6–33.0)
MCHC: 32.6 g/dL (ref 31.5–35.7)
MCV: 87 fL (ref 79–97)
Monocytes Absolute: 0.6 10*3/uL (ref 0.1–0.9)
Monocytes: 9 %
Neutrophils Absolute: 3.6 10*3/uL (ref 1.4–7.0)
Neutrophils: 54 %
Platelets: 276 10*3/uL (ref 150–450)
RBC: 4.27 x10E6/uL (ref 3.77–5.28)
RDW: 13.4 % (ref 11.7–15.4)
WBC: 6.6 10*3/uL (ref 3.4–10.8)

## 2021-12-10 LAB — INSULIN, RANDOM: INSULIN: 13 u[IU]/mL (ref 2.6–24.9)

## 2021-12-10 LAB — VITAMIN D 25 HYDROXY (VIT D DEFICIENCY, FRACTURES): Vit D, 25-Hydroxy: 46.6 ng/mL (ref 30.0–100.0)

## 2021-12-10 LAB — TSH: TSH: 2.27 u[IU]/mL (ref 0.450–4.500)

## 2021-12-15 NOTE — Progress Notes (Unsigned)
Chief Complaint:   OBESITY [name] is here to discuss her progress with her obesity treatment plan along with follow-up of her obesity related diagnoses. Satrina is on keeping a food journal and adhering to recommended goals of 1200 calories and 75+ grams of protein daily and states she is following her eating plan approximately 50% of the time. [name] states she is doing pool exercises for 60 minutes 4 times per week.  Today's visit was #: 81 Starting weight: 241 lbs Starting date: 09/28/2016 Today's weight: 227 lbs Today's date: 12/09/2021 Total lbs lost to date: 14 Total lbs lost since last in-office visit: 0  Interim History: [name] has been on prednisone on and off a lot recently and she is struggling with weight gain.  She is getting ready to have hip replacement.  She has a history of osteoarthritis and psoriatic arthritis.  Subjective:   1. Chronic renal impairment, stage 3a ([place]) [name]'s last creatinine was elevated.  She is working on hydration and would like to have labs rechecked.  2. Mixed hyperlipidemia [name] is working on decreasing cholesterol in her diet, and she is due for labs.  She is not on a statin.  3. Vitamin D deficiency [name] is on vitamin D with no side effects, and she is due for labs.  4. Type 2 diabetes mellitus with other specified complication, unspecified whether long term insulin use ([place]) [name] is stable on her medications.  She continues to work on her diet but she has been on prednisone a lot lately.  5. Other depression with emotional eating [name] is stable on Topamax, with no side effects noted.  Assessment/Plan:   1. Chronic renal impairment, stage 3a (H[facility]) We will check labs today, and we will follow-up at [place] next visit.  - CMP14+EGFR  2. Mixed hyperlipidemia We will check labs today, and we will follow-up at [place] next visit. She will continue to work on diet, exercise and weight loss efforts.  Orders and follow up as documented in patient record.   - Lipid Panel With LDL/HDL Ratio  3. Vitamin D deficiency We will check labs today, and we will follow-up at [place] next visit. She will follow-up for routine testing of Vitamin D, at least 2-3 times per year to avoid over-replacement.  - VITAMIN D 25 Hydroxy (Vit-D Deficiency, Fractures)  4. Type 2 diabetes mellitus with other specified complication, unspecified whether long term insulin use (H[facility]) We will check labs today.  [place] will continue her medications, and we will refill metformin and Ozempic for 90 days.  - metFORMIN (GLUCOPHAGE) 500 MG tablet; Take 1 tablet (500 mg total) by mouth 2 (two) times daily with a meal.  Dispense: 180 tablet; Refill: 0 - Semaglutide, 2 MG/DOSE, 8 MG/3ML SOPN; Inject 2 mg as directed once a week.  Dispense: 9 mL; Refill: 0 - CBC with Differential/Platelet - Hemoglobin A1c - Insulin, random - TSH  5. Other depression with emotional eating [name] will continue Topamax 50 mg once daily, and we will refill for 90 days.  - topiramate (TOPAMAX) 50 MG tablet; Take 1 tablet (50 mg total) by mouth daily.  Dispense: 90 tablet; Refill: 0  6. Obesity, Current BMI 34.5 [name] is currently in the action stage of change. As such, her goal is to continue with weight loss efforts. She has agreed to keeping a food journal and adhering to recommended goals of 1200 calories and 75 grams of protein daily.   Exercise goals: As is.  Behavioral modification strategies: increasing lean protein intake.  Maryann has agreed to follow-up with our clinic in 3 weeks. She was informed of the importance of frequent follow-up visits to maximize her success with intensive lifestyle modifications for her multiple health conditions.   [name] was informed we would discuss her lab results at her next visit unless there is a critical issue that needs to be addressed sooner. [name] agreed to keep her next visit at  the agreed upon time to discuss these results.  Objective:   Blood pressure 122/71, pulse 80, temperature 98.4 F (36.9 C), height '5\' 8"'  (1.727 m), weight 227 lb (103 kg), SpO2 99 %. Body mass index is 34.52 kg/m.  General: Cooperative, alert, well developed, in no acute distress. HEENT: Conjunctivae and lids unremarkable. Cardiovascular: Regular rhythm.  Lungs: Normal work of breathing. Neurologic: No focal deficits.   Lab Results  Component Value Date   CREATININE 0.89 12/09/2021   BUN 22 12/09/2021   NA 142 12/09/2021   K 4.3 12/09/2021   CL 105 12/09/2021   CO2 20 12/09/2021   Lab Results  Component Value Date   ALT 21 12/09/2021   AST 20 12/09/2021   ALKPHOS 102 12/09/2021   BILITOT 0.2 12/09/2021   Lab Results  Component Value Date   HGBA1C 5.5 12/09/2021   HGBA1C 5.5 04/27/2021   HGBA1C 5.7 (H) 12/31/2020   HGBA1C 5.8 (H) 08/13/2020   HGBA1C 5.9 04/28/2020   Lab Results  Component Value Date   INSULIN 13.0 12/09/2021   INSULIN 28.5 (H) 04/27/2021   INSULIN 17.9 12/31/2020   INSULIN 14.2 08/13/2020   INSULIN 25.1 (H) 04/30/2020   Lab Results  Component Value Date   TSH 2.270 12/09/2021   Lab Results  Component Value Date   CHOL 144 12/09/2021   HDL 55 12/09/2021   LDLCALC 74 12/09/2021   TRIG 78 12/09/2021   CHOLHDL 3 08/16/2016   Lab Results  Component Value Date   VD25OH 46.6 12/09/2021   VD25OH 35.1 04/27/2021   VD25OH 45.9 12/31/2020   Lab Results  Component Value Date   WBC 6.6 12/09/2021   HGB 12.1 12/09/2021   HCT 37.1 12/09/2021   MCV 87 12/09/2021   PLT 276 12/09/2021   No results found for: "IRON", "TIBC", "FERRITIN"  Attestation Statements:   Reviewed by clinician on day of visit: allergies, medications, problem list, medical history, surgical history, family history, social history, and previous encounter notes.   I, Trixie Dredge, am acting as transcriptionist for Dennard Nip, MD.  I have reviewed the above  documentation for accuracy and completeness, and I agree with the above. -  Dennard Nip, MD

## 2021-12-19 ENCOUNTER — Other Ambulatory Visit: Payer: Self-pay | Admitting: Internal Medicine

## 2021-12-19 DIAGNOSIS — J309 Allergic rhinitis, unspecified: Secondary | ICD-10-CM

## 2021-12-23 DIAGNOSIS — H93293 Other abnormal auditory perceptions, bilateral: Secondary | ICD-10-CM | POA: Diagnosis not present

## 2021-12-23 DIAGNOSIS — H6063 Unspecified chronic otitis externa, bilateral: Secondary | ICD-10-CM | POA: Diagnosis not present

## 2021-12-27 DIAGNOSIS — M159 Polyosteoarthritis, unspecified: Secondary | ICD-10-CM | POA: Diagnosis not present

## 2021-12-27 DIAGNOSIS — L4050 Arthropathic psoriasis, unspecified: Secondary | ICD-10-CM | POA: Diagnosis not present

## 2021-12-27 DIAGNOSIS — Z7960 Long term (current) use of unspecified immunomodulators and immunosuppressants: Secondary | ICD-10-CM | POA: Diagnosis not present

## 2021-12-27 DIAGNOSIS — L409 Psoriasis, unspecified: Secondary | ICD-10-CM | POA: Diagnosis not present

## 2021-12-28 ENCOUNTER — Other Ambulatory Visit: Payer: Self-pay | Admitting: Orthopedic Surgery

## 2021-12-28 ENCOUNTER — Encounter: Payer: Self-pay | Admitting: Orthopedic Surgery

## 2021-12-28 DIAGNOSIS — Z01818 Encounter for other preprocedural examination: Secondary | ICD-10-CM

## 2021-12-28 NOTE — H&P (Signed)
NAME: [name] MRN:   [id number] DOB:   [birth year]     HISTORY AND PHYSICAL  CHIEF COMPLAINT:  left hip pain  HISTORY:   Frances Ohlsen Stoutis a 51 y.o. female  with left  Hip Pain Patient complains of left hip pain. Onset of the symptoms was several years ago. Inciting event: known DJD. The patient reports the hip pain is worse with weight bearing. Associated symptoms: none. Aggravating symptoms include: any weight bearing. Patient has had prior hip problems. Previous visits for this problem: yes, last seen several weeks ago by Dr. Harlow Mares . Evaluation to date: plain films, which were abnormal  osteoarthritis . Treatment to date: OTC analgesics, which have been somewhat effective, prescription analgesics, which have been somewhat effective, home exercise program, which has been somewhat effective, and physical therapy, which has been somewhat effective.     Plan for left total hip replacement  PAST MEDICAL HISTORY:   Past Medical History:  Diagnosis Date   Anemia    Anxiety    Arthritis    inflammitory   Asthma    B12 deficiency    Back pain    Chronic joint pain    Constipation    DDD (degenerative disc disease), lumbar    low back and neck and shoulders   Depression    during divorce & legal matters   Fatty liver    Fibromyalgia    Gallbladder problem    GERD (gastroesophageal reflux disease)    History of kidney stones    Dr Phebe Colla   History of polycystic ovarian disease S/P BSO   Hx of anxiety disorder    Hypertension    a   Hypothyroidism    no meds   IBS (irritable bowel syndrome)    IBS (irritable bowel syndrome)    Infertility, female    Joint pain    Kidney problem    Lactose intolerance    Lupus (HCC) hx positive ANA   followed by Dr Gavin Pound; possible lupus   Multiple food allergies    Myalgia    Osteoarthritis    Polyarthritis, 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[place])    POLYCYSTIC OVARIAN DISEASE 03/02/2007   Qualifier: Diagnosis of  By: Linna Darner MD, Rae Mar  BSO for cysts & endometriosis; Dr Reino Kent, Gyn Seeing Dr Paula Compton     PONV (postoperative nausea and vomiting)    Pre-diabetes    Prediabetes    Sweating profusely    Symptomatic mammary hypertrophy    URI (upper respiratory infection)    currently on cefdinir    PAST SURGICAL HISTORY:   Past Surgical History:  Procedure Laterality Date   ABDOMINAL HYSTERECTOMY     ANKLE SURGERY     X3   APPENDECTOMY  1991   exploratory lap   BACK SURGERY     BREAST REDUCTION SURGERY Bilateral 03/26/2015   Procedure: BILATERAL BREAST REDUCTION ;  Surgeon: Wallace Going, DO;  Location: [facility];  Service: Plastics;  Laterality: Bilateral;   CARPAL TUNNEL RELEASE     bilateral; Dr Sherwood Gambler   COLONOSCOPY     in [place]  07-09-2004   HX BILATERAL RENAL STONES/ RIGHT FLANK PAIN   CYSTOSCOPY W/ URETERAL STENT PLACEMENT  11/12/2011   Procedure: CYSTOSCOPY WITH RETROGRADE PYELOGRAM/URETERAL STENT PLACEMENT;  Surgeon: Alexis Frock, MD;  Location: [facility];  Service: Urology;  Laterality: Right;   CYSTOSCOPY W/ URETERAL STENT REMOVAL  12/07/2011  Procedure: CYSTOSCOPY WITH STENT REMOVAL;  Surgeon: Alexis Frock, MD;  Location: [facility];  Service: Urology;  Laterality: Right;   CYSTOSCOPY/RETROGRADE/URETEROSCOPY/STONE EXTRACTION WITH BASKET  12/07/2011   Procedure: CYSTOSCOPY/RETROGRADE/URETEROSCOPY/STONE EXTRACTION WITH BASKET;  Surgeon: Alexis Frock, MD;  Location: [facility];  Service: Urology;  Laterality: Right;   DILATION AND CURETTAGE OF UTERUS     X5   EXCISION LEFT BARTHOLIN GLAND  02-05-2002   EYE SURGERY     Retinal tears surgery bilateral   LAPAROSCOPIC ASSISTED VAGINAL HYSTERECTOMY  2000   LAPAROSCOPIC CHOLECYSTECTOMY  05-31-2007   LAPAROSCOPIC LASER ABLATION ENDOMETRIOSIS AND LEFT SALPINGO-OOPHORECTOMY  11-03-1999   LAPAROSCOPY WITH RIGHT SALPINGO-OOPHECTOMY/ LYSIS ADHESIONS AND  ABLATION ENDOMETRIOSIS  05-17-2001   LIPOSUCTION Bilateral 03/26/2015   Procedure: WITH LIPOSUCTION;  Surgeon: Wallace Going, DO;  Location: [facility];  Service: Plastics;  Laterality: Bilateral;   LUMBAR LAMINECTOMY  2003   L4 - L5   NECK SURGERY  2017   PLANTAR FASCIA SURGERY     right   PLANTAR FASCIA SURGERY  02/2011   left   REDUCTION MAMMAPLASTY     RIGHT URETEROSCOPIC STONE EXTRACTION  08-04-2000   SHOULDER ARTHROSCOPY WITH ROTATOR CUFF REPAIR Right 05/11/2020   Procedure: SHOULDER ARTHROSCOPY WITH ROTATOR CUFF REPAIR;  Surgeon: Lovell Sheehan, MD;  Location: [facility];  Service: Orthopedics;  Laterality: Right;   TONSILLECTOMY     URETERAL STENT PLACEMENT  11/12/11   Dr Tresa Moore    MEDICATIONS:  (Not in a hospital admission)   ALLERGIES:   Allergies  Allergen Reactions   Chlorhexidine Gluconate Rash, Itching and Swelling    Developed severe rash where chloraprep was used on chest area   Ivp Dye [Iodinated Contrast Media] Rash and Other (See Comments)    Flushing, minor facial rash & dyspnea   Nalbuphine Shortness Of Breath and Rash    Nubain caused respiratory distress & rash   Septra [Sulfamethoxazole-Trimethoprim] Shortness Of Breath and Rash   Adhesive [Tape] Rash   Cymbalta [Duloxetine Hcl]     Had drastic mood changes   Diclofenac Other (See Comments)    GI Upset   Humira [Adalimumab]     "Did not like the way I felt"   Methotrexate Derivatives     GI upset     Augmentin [Amoxicillin-Pot Clavulanate] Nausea And Vomiting   Betadine [Povidone Iodine] Rash   Latex Rash    REVIEW OF SYSTEMS:   Negative except HPI  FAMILY HISTORY:   Family History  Problem Relation Age of Onset   Hypertension Mother    Colon polyps Mother    Atrial fibrillation Mother        Dr Angelena Form   Hyperlipidemia Mother    Heart disease Mother    Anxiety disorder Mother    Sleep apnea Mother    Obesity Mother    Heart attack Father 33   Diabetes Father     Hypertension Father    Hyperlipidemia Father    Heart disease Father    Stroke Father    Obesity Father    Hypertension Sister    Heart attack Paternal Grandmother 33   Breast cancer Paternal Aunt 72   Colon cancer Maternal Uncle    Colon cancer Paternal Uncle    Colon polyps Maternal Grandmother    Stroke Maternal Grandmother 76   Diabetes Paternal Grandfather    Stroke Maternal Grandfather 37    SOCIAL HISTORY:   reports that she has  never smoked. She has never used smokeless tobacco. She reports current alcohol use of about 1.0 standard drink of alcohol per week. She reports that she does not use drugs.  PHYSICAL EXAM:  General appearance: alert, cooperative, and no distress Neck: no JVD and supple, symmetrical, trachea midline Resp: clear to auscultation bilaterally Cardio: regular rate and rhythm, S1, S2 normal, no murmur, click, rub or gallop GI: soft, non-tender; bowel sounds normal; no masses,  no organomegaly Extremities: extremities normal, atraumatic, no cyanosis or edema and Homans sign is negative, no sign of DVT Pulses: 2+ and symmetric Skin: Skin color, texture, turgor normal. No rashes or lesions Neurologic: Alert and oriented X 3, normal strength and tone. Normal symmetric reflexes. Normal coordination and gait    LABORATORY STUDIES: No results for input(s): "WBC", "HGB", "HCT", "PLT" in the last 72 hours.  No results for input(s): "NA", "K", "CL", "CO2", "GLUCOSE", "BUN", "CREATININE", "CALCIUM" in the last 72 hours.  STUDIES/RESULTS:  No results found.  ASSESSMENT:  End stage osteoarthritis left hip        Active Problems:   * No active hospital problems. *    PLAN:  Left Primary Total Hip   Carlynn Spry 12/28/2021. 12:05 PM

## 2021-12-28 NOTE — H&P (Deleted)
  The note originally documented on this encounter has been moved the the encounter in which it belongs.  

## 2021-12-30 ENCOUNTER — Encounter
Admission: RE | Admit: 2021-12-30 | Discharge: 2021-12-30 | Disposition: A | Discharge status: Home / Self Care | Payer: PPO | Source: Ambulatory Visit | Attending: Orthopedic Surgery | Admitting: Orthopedic Surgery

## 2021-12-30 VITALS — BP 115/81 | HR 79 | Resp 16 | Ht 68.0 in | Wt 229.9 lb

## 2021-12-30 DIAGNOSIS — Z01812 Encounter for preprocedural laboratory examination: Secondary | ICD-10-CM

## 2021-12-30 DIAGNOSIS — Z01818 Encounter for other preprocedural examination: Secondary | ICD-10-CM | POA: Diagnosis not present

## 2021-12-30 LAB — SURGICAL PCR SCREEN
MRSA, PCR: NEGATIVE
Staphylococcus aureus: NEGATIVE

## 2021-12-30 LAB — URINALYSIS, ROUTINE W REFLEX MICROSCOPIC
Bilirubin Urine: NEGATIVE
Glucose, UA: NEGATIVE mg/dL
Hgb urine dipstick: NEGATIVE
Ketones, ur: NEGATIVE mg/dL
Nitrite: NEGATIVE
Protein, ur: NEGATIVE mg/dL
Specific Gravity, Urine: 1.013 (ref 1.005–1.030)
pH: 5 (ref 5.0–8.0)

## 2021-12-30 LAB — TYPE AND SCREEN
ABO/RH(D): O NEG
Antibody Screen: NEGATIVE

## 2021-12-30 NOTE — Patient Instructions (Addendum)
Your procedure is scheduled on: 01/11/22 - Tuesday Report to the Registration Desk on the 1st floor of the [facility]. To find out your arrival time, please call [phone] between 1PM - 3PM on: 01/10/22 - Monday If your arrival time is 6:00 am, do not arrive prior to that time as the [facility] entrance doors do not open until 6:00 am.  REMEMBER: Instructions that are not followed completely may result in serious medical risk, up to and including death; or upon the discretion of your surgeon and anesthesiologist your surgery may need to be rescheduled.  Do not eat food or drink any fluids after midnight the night before surgery.  No gum chewing, lozengers or hard candies.  TAKE THESE MEDICATIONS THE MORNING OF SURGERY WITH A SIP OF WATER:  - albuterol (VENTOLIN )  - citalopram (CELEXA)  - hydroxychloroquine (PLAQUENIL)  - famotidine (PEPCID)  HOLD metFORMIN (GLUCOPHAGE) beginning 01/09/22.  HOLD Semaglutide 7 days prior to your surgery.  One week prior to surgery: Stop Anti-inflammatories (NSAIDS) such as Advil, Aleve, Ibuprofen, Motrin, Naproxen, Naprosyn and Aspirin based products such as Excedrin, Goodys Powder, BC Powder.  Stop ANY OVER THE COUNTER supplements until after surgery.  You may however, continue to take Tylenol if needed for pain up until the day of surgery.  No Alcohol for 24 hours before or after surgery.  No Smoking including e-cigarettes for 24 hours prior to surgery.  No chewable tobacco products for at least 6 hours prior to surgery.  No nicotine patches on the day of surgery.  Do not use any "recreational" drugs for at least a week prior to your surgery.  Please be advised that the combination of cocaine and anesthesia may have negative outcomes, up to and including death. If you test positive for cocaine, your surgery will be cancelled.  On the morning of surgery brush your teeth with toothpaste and water, you may rinse your mouth with mouthwash  if you wish. Do not swallow any toothpaste or mouthwash.  Do not wear jewelry, make-up, hairpins, clips or nail polish.  Do not wear lotions, powders, or perfumes.   Do not shave body from the neck down 48 hours prior to surgery just in case you cut yourself which could leave a site for infection.  Also, freshly shaved skin may become irritated if using the CHG soap.  Contact lenses, hearing aids and dentures may not be worn into surgery.  Do not bring valuables to the hospital. [facility] is not responsible for any missing/lost belongings or valuables.   Notify your doctor if there is any change in your medical condition (cold, fever, infection).  Wear comfortable clothing (specific to your surgery type) to the hospital.  After surgery, you can help prevent lung complications by doing breathing exercises.  Take deep breaths and cough every 1-2 hours. Your doctor may order a device called an Incentive Spirometer to help you take deep breaths. When coughing or sneezing, hold a pillow firmly against your incision with both hands. This is called "splinting." Doing this helps protect your incision. It also decreases belly discomfort.  If you are being admitted to the hospital overnight, leave your suitcase in the car. After surgery it may be brought to your room.  If you are being discharged the day of surgery, you will not be allowed to drive home. You will need a responsible adult (18 years or older) to drive you home and stay with you that night.   If you  are taking public transportation, you will need to have a responsible adult (18 years or older) with you. Please confirm with your physician that it is acceptable to use public transportation.   Please call the [facility]. at [phone] if you have any questions about these instructions.  Surgery Visitation Policy:  Patients undergoing a surgery or procedure may have two family members or support persons with  them as long as the person is not COVID-19 positive or experiencing its symptoms.   Inpatient Visitation:    Visiting hours are 7 a.m. to 8 p.m. Up to four visitors are allowed at one time in a patient room, including children. The visitors may rotate out with other people during the day. One designated support person (adult) may remain overnight.

## 2022-01-03 ENCOUNTER — Encounter: Payer: Self-pay | Admitting: Internal Medicine

## 2022-01-04 ENCOUNTER — Other Ambulatory Visit: Payer: Self-pay

## 2022-01-06 ENCOUNTER — Other Ambulatory Visit: Payer: Self-pay

## 2022-01-06 MED ORDER — ALBUTEROL SULFATE HFA 108 (90 BASE) MCG/ACT IN AERS: 2.0000 | INHALATION_SPRAY | Freq: Four times a day (QID) | RESPIRATORY_TRACT | 5 refills | Status: DC | PRN

## 2022-01-08 DIAGNOSIS — M1612 Unilateral primary osteoarthritis, left hip: Secondary | ICD-10-CM | POA: Diagnosis not present

## 2022-01-11 ENCOUNTER — Encounter
Admission: RE | Disposition: A | Discharge status: Home / Self Care | Payer: Self-pay | Source: Home / Self Care | Attending: Orthopedic Surgery

## 2022-01-11 ENCOUNTER — Other Ambulatory Visit: Payer: Self-pay

## 2022-01-11 ENCOUNTER — Ambulatory Visit: Payer: PPO

## 2022-01-11 ENCOUNTER — Ambulatory Visit: Payer: PPO | Admitting: Anesthesiology

## 2022-01-11 ENCOUNTER — Observation Stay
Admission: RE | Admit: 2022-01-11 | Discharge: 2022-01-12 | Disposition: A | Discharge status: Home / Self Care | Payer: PPO | Attending: Orthopedic Surgery | Admitting: Orthopedic Surgery

## 2022-01-11 ENCOUNTER — Ambulatory Visit: Payer: PPO | Admitting: Urgent Care

## 2022-01-11 ENCOUNTER — Encounter: Payer: Self-pay | Admitting: Orthopedic Surgery

## 2022-01-11 DIAGNOSIS — Z91040 Latex allergy status: Secondary | ICD-10-CM | POA: Diagnosis not present

## 2022-01-11 DIAGNOSIS — Z23 Encounter for immunization: Secondary | ICD-10-CM | POA: Insufficient documentation

## 2022-01-11 DIAGNOSIS — Z471 Aftercare following joint replacement surgery: Secondary | ICD-10-CM | POA: Diagnosis not present

## 2022-01-11 DIAGNOSIS — I10 Essential (primary) hypertension: Secondary | ICD-10-CM | POA: Diagnosis not present

## 2022-01-11 DIAGNOSIS — E039 Hypothyroidism, unspecified: Secondary | ICD-10-CM | POA: Insufficient documentation

## 2022-01-11 DIAGNOSIS — M1612 Unilateral primary osteoarthritis, left hip: Principal | ICD-10-CM | POA: Insufficient documentation

## 2022-01-11 DIAGNOSIS — Z96642 Presence of left artificial hip joint: Secondary | ICD-10-CM | POA: Diagnosis not present

## 2022-01-11 DIAGNOSIS — J45909 Unspecified asthma, uncomplicated: Secondary | ICD-10-CM | POA: Diagnosis not present

## 2022-01-11 HISTORY — PX: TOTAL HIP ARTHROPLASTY: SHX124

## 2022-01-11 LAB — ABO/RH: ABO/RH(D): O NEG

## 2022-01-11 SURGERY — ARTHROPLASTY, HIP, TOTAL, ANTERIOR APPROACH
Anesthesia: Spinal | Site: Hip | Laterality: Left

## 2022-01-11 MED ORDER — FENTANYL CITRATE (PF) 100 MCG/2ML IJ SOLN
25.0000 ug | INTRAMUSCULAR | Status: DC | PRN
  Administered 2022-01-11: 25 ug via INTRAVENOUS

## 2022-01-11 MED ORDER — ACETAMINOPHEN 160 MG/5ML PO SOLN: 325.0000 mg | ORAL | Status: DC | PRN

## 2022-01-11 MED ORDER — PROPOFOL 10 MG/ML IV BOLUS
INTRAVENOUS | Status: AC
  Filled 2022-01-11: qty 20

## 2022-01-11 MED ORDER — ONDANSETRON HCL 4 MG/2ML IJ SOLN: 4.0000 mg | Freq: Four times a day (QID) | INTRAMUSCULAR | Status: DC | PRN

## 2022-01-11 MED ORDER — KETOROLAC TROMETHAMINE 15 MG/ML IJ SOLN
15.0000 mg | Freq: Four times a day (QID) | INTRAMUSCULAR | Status: DC
  Administered 2022-01-11 – 2022-01-12 (×3): 15 mg via INTRAVENOUS
  Filled 2022-01-11 (×3): qty 1

## 2022-01-11 MED ORDER — LISINOPRIL 20 MG PO TABS
40.0000 mg | ORAL_TABLET | Freq: Every day | ORAL | Status: DC
  Administered 2022-01-12: 40 mg via ORAL
  Filled 2022-01-11: qty 2

## 2022-01-11 MED ORDER — MIDAZOLAM HCL 2 MG/2ML IJ SOLN
INTRAMUSCULAR | Status: AC
  Filled 2022-01-11: qty 2

## 2022-01-11 MED ORDER — ORAL CARE MOUTH RINSE: 15.0000 mL | Freq: Once | OROMUCOSAL | Status: DC

## 2022-01-11 MED ORDER — NEOMYCIN-POLYMYXIN B GU 40-200000 IR SOLN
Status: AC
  Filled 2022-01-11: qty 20

## 2022-01-11 MED ORDER — SODIUM CHLORIDE 0.9 % IR SOLN
Status: DC | PRN
  Administered 2022-01-11: 3012 mL

## 2022-01-11 MED ORDER — DIPHENHYDRAMINE HCL 25 MG PO CAPS
50.0000 mg | ORAL_CAPSULE | Freq: Every day | ORAL | Status: DC
  Administered 2022-01-11: 50 mg via ORAL
  Filled 2022-01-11: qty 2

## 2022-01-11 MED ORDER — MELATONIN 5 MG PO TABS
5.0000 mg | ORAL_TABLET | ORAL | Status: DC | PRN
  Administered 2022-01-12: 5 mg via ORAL
  Filled 2022-01-11: qty 1

## 2022-01-11 MED ORDER — HYDROCODONE-ACETAMINOPHEN 7.5-325 MG PO TABS
1.0000 | ORAL_TABLET | ORAL | Status: DC | PRN
  Administered 2022-01-11: 2 via ORAL
  Filled 2022-01-11: qty 2

## 2022-01-11 MED ORDER — SODIUM CHLORIDE 0.9 % IR SOLN
Status: DC | PRN
  Administered 2022-01-11: 250 mL

## 2022-01-11 MED ORDER — METOCLOPRAMIDE HCL 5 MG PO TABS: 5.0000 mg | ORAL_TABLET | Freq: Three times a day (TID) | ORAL | Status: DC | PRN

## 2022-01-11 MED ORDER — TRANEXAMIC ACID-NACL 1000-0.7 MG/100ML-% IV SOLN
1000.0000 mg | INTRAVENOUS | Status: AC
  Administered 2022-01-11: 1000 mg via INTRAVENOUS

## 2022-01-11 MED ORDER — CEFAZOLIN SODIUM-DEXTROSE 2-4 GM/100ML-% IV SOLN
2.0000 g | INTRAVENOUS | Status: AC
  Administered 2022-01-11: 2 g via INTRAVENOUS

## 2022-01-11 MED ORDER — CITALOPRAM HYDROBROMIDE 20 MG PO TABS
20.0000 mg | ORAL_TABLET | Freq: Every day | ORAL | Status: DC
  Administered 2022-01-12: 20 mg via ORAL
  Filled 2022-01-11: qty 1

## 2022-01-11 MED ORDER — ONDANSETRON HCL 4 MG PO TABS: 4.0000 mg | ORAL_TABLET | Freq: Four times a day (QID) | ORAL | Status: DC | PRN

## 2022-01-11 MED ORDER — MORPHINE SULFATE (PF) 4 MG/ML IV SOLN
0.5000 mg | INTRAVENOUS | Status: DC | PRN
  Administered 2022-01-11: 1 mg via INTRAVENOUS
  Filled 2022-01-11: qty 1

## 2022-01-11 MED ORDER — PHENYLEPHRINE 80 MCG/ML (10ML) SYRINGE FOR IV PUSH (FOR BLOOD PRESSURE SUPPORT)
PREFILLED_SYRINGE | INTRAVENOUS | Status: AC
  Filled 2022-01-11: qty 10

## 2022-01-11 MED ORDER — MEPERIDINE HCL 25 MG/ML IJ SOLN: 6.2500 mg | INTRAMUSCULAR | Status: DC | PRN

## 2022-01-11 MED ORDER — ONDANSETRON HCL 4 MG/2ML IJ SOLN
INTRAMUSCULAR | Status: AC
  Filled 2022-01-11: qty 2

## 2022-01-11 MED ORDER — LACTATED RINGERS IV SOLN: INTRAVENOUS | Status: DC

## 2022-01-11 MED ORDER — ACETAMINOPHEN 325 MG PO TABS
325.0000 mg | ORAL_TABLET | Freq: Four times a day (QID) | ORAL | Status: DC | PRN
  Administered 2022-01-12: 500 mg via ORAL

## 2022-01-11 MED ORDER — EPINEPHRINE 0.3 MG/0.3ML IJ SOAJ: 0.3000 mg | INTRAMUSCULAR | Status: DC | PRN

## 2022-01-11 MED ORDER — MAGNESIUM CITRATE PO SOLN: 1.0000 | Freq: Once | ORAL | Status: DC | PRN

## 2022-01-11 MED ORDER — BUPIVACAINE HCL (PF) 0.5 % IJ SOLN
INTRAMUSCULAR | Status: AC
  Filled 2022-01-11: qty 10

## 2022-01-11 MED ORDER — MAGNESIUM HYDROXIDE 400 MG/5ML PO SUSP: 30.0000 mL | Freq: Every day | ORAL | Status: DC | PRN

## 2022-01-11 MED ORDER — HYDROXYCHLOROQUINE SULFATE 200 MG PO TABS
200.0000 mg | ORAL_TABLET | Freq: Two times a day (BID) | ORAL | Status: DC
  Administered 2022-01-11 – 2022-01-12 (×2): 200 mg via ORAL
  Filled 2022-01-11 (×2): qty 1

## 2022-01-11 MED ORDER — 0.9 % SODIUM CHLORIDE (POUR BTL) OPTIME
TOPICAL | Status: DC | PRN
  Administered 2022-01-11: 1000 mL

## 2022-01-11 MED ORDER — ONDANSETRON HCL 4 MG/2ML IJ SOLN
INTRAMUSCULAR | Status: DC | PRN
  Administered 2022-01-11: 4 mg via INTRAVENOUS

## 2022-01-11 MED ORDER — BUPIVACAINE HCL (PF) 0.5 % IJ SOLN
INTRAMUSCULAR | Status: DC | PRN
  Administered 2022-01-11: 2.6 mL via INTRATHECAL

## 2022-01-11 MED ORDER — METFORMIN HCL 500 MG PO TABS
500.0000 mg | ORAL_TABLET | Freq: Two times a day (BID) | ORAL | Status: DC
  Administered 2022-01-12: 500 mg via ORAL
  Filled 2022-01-11: qty 1

## 2022-01-11 MED ORDER — PROPOFOL 10 MG/ML IV BOLUS
INTRAVENOUS | Status: AC
  Filled 2022-01-11: qty 60

## 2022-01-11 MED ORDER — ALUM & MAG HYDROXIDE-SIMETH 200-200-20 MG/5ML PO SUSP: 30.0000 mL | ORAL | Status: DC | PRN

## 2022-01-11 MED ORDER — BISACODYL 10 MG RE SUPP: 10.0000 mg | Freq: Every day | RECTAL | Status: DC | PRN

## 2022-01-11 MED ORDER — MENTHOL 3 MG MT LOZG: 1.0000 | LOZENGE | OROMUCOSAL | Status: DC | PRN

## 2022-01-11 MED ORDER — ACETAMINOPHEN 10 MG/ML IV SOLN
INTRAVENOUS | Status: AC
  Filled 2022-01-11: qty 100

## 2022-01-11 MED ORDER — BUPIVACAINE-MELOXICAM ER 200-6 MG/7ML IJ SOLN
INTRAMUSCULAR | Status: AC
  Filled 2022-01-11: qty 1

## 2022-01-11 MED ORDER — PROPOFOL 10 MG/ML IV BOLUS
INTRAVENOUS | Status: DC | PRN
  Administered 2022-01-11: 40 mg via INTRAVENOUS
  Administered 2022-01-11: 90 ug/kg/min via INTRAVENOUS

## 2022-01-11 MED ORDER — ACETAMINOPHEN 325 MG PO TABS: 325.0000 mg | ORAL_TABLET | ORAL | Status: DC | PRN

## 2022-01-11 MED ORDER — PHENOL 1.4 % MT LIQD: 1.0000 | OROMUCOSAL | Status: DC | PRN

## 2022-01-11 MED ORDER — BUPIVACAINE-MELOXICAM ER 200-6 MG/7ML IJ SOLN
INTRAMUSCULAR | Status: DC | PRN
  Administered 2022-01-11: 7 mL

## 2022-01-11 MED ORDER — METOCLOPRAMIDE HCL 5 MG/ML IJ SOLN: 5.0000 mg | Freq: Three times a day (TID) | INTRAMUSCULAR | Status: DC | PRN

## 2022-01-11 MED ORDER — PHENYLEPHRINE HCL (PRESSORS) 10 MG/ML IV SOLN
INTRAVENOUS | Status: DC | PRN
  Administered 2022-01-11: 80 ug via INTRAVENOUS

## 2022-01-11 MED ORDER — DEXAMETHASONE SODIUM PHOSPHATE 10 MG/ML IJ SOLN
INTRAMUSCULAR | Status: AC
  Filled 2022-01-11: qty 1

## 2022-01-11 MED ORDER — DEXAMETHASONE SODIUM PHOSPHATE 10 MG/ML IJ SOLN
INTRAMUSCULAR | Status: DC | PRN
  Administered 2022-01-11: 10 mg via INTRAVENOUS

## 2022-01-11 MED ORDER — LIDOCAINE HCL (CARDIAC) PF 100 MG/5ML IV SOSY
PREFILLED_SYRINGE | INTRAVENOUS | Status: DC | PRN
  Administered 2022-01-11: 100 mg via INTRAVENOUS

## 2022-01-11 MED ORDER — ALBUTEROL SULFATE (2.5 MG/3ML) 0.083% IN NEBU: 2.5000 mg | INHALATION_SOLUTION | Freq: Four times a day (QID) | RESPIRATORY_TRACT | Status: DC | PRN

## 2022-01-11 MED ORDER — BUPIVACAINE-EPINEPHRINE (PF) 0.25% -1:200000 IJ SOLN
INTRAMUSCULAR | Status: DC | PRN
  Administered 2022-01-11: 30 mL

## 2022-01-11 MED ORDER — PHENYLEPHRINE HCL-NACL 20-0.9 MG/250ML-% IV SOLN
INTRAVENOUS | Status: DC | PRN
  Administered 2022-01-11: 80 ug/min via INTRAVENOUS

## 2022-01-11 MED ORDER — HYDROCHLOROTHIAZIDE 12.5 MG PO TABS
12.5000 mg | ORAL_TABLET | Freq: Every day | ORAL | Status: DC
  Administered 2022-01-12: 12.5 mg via ORAL
  Filled 2022-01-11: qty 1

## 2022-01-11 MED ORDER — HYDROCODONE-ACETAMINOPHEN 7.5-325 MG PO TABS: 1.0000 | ORAL_TABLET | Freq: Once | ORAL | Status: DC | PRN

## 2022-01-11 MED ORDER — FAMOTIDINE 20 MG PO TABS
20.0000 mg | ORAL_TABLET | Freq: Every day | ORAL | Status: DC
  Administered 2022-01-11: 20 mg via ORAL
  Filled 2022-01-11: qty 1

## 2022-01-11 MED ORDER — ASPIRIN 81 MG PO CHEW
81.0000 mg | CHEWABLE_TABLET | Freq: Two times a day (BID) | ORAL | Status: DC
  Administered 2022-01-11 – 2022-01-12 (×2): 81 mg via ORAL
  Filled 2022-01-11 (×2): qty 1

## 2022-01-11 MED ORDER — ACETAMINOPHEN 10 MG/ML IV SOLN
INTRAVENOUS | Status: DC | PRN
  Administered 2022-01-11: 1000 mg via INTRAVENOUS

## 2022-01-11 MED ORDER — LEVOCETIRIZINE DIHYDROCHLORIDE 5 MG PO TABS: 5.0000 mg | ORAL_TABLET | Freq: Every evening | ORAL | Status: DC

## 2022-01-11 MED ORDER — BUPIVACAINE-EPINEPHRINE (PF) 0.25% -1:200000 IJ SOLN
INTRAMUSCULAR | Status: AC
  Filled 2022-01-11: qty 30

## 2022-01-11 MED ORDER — TOPIRAMATE 25 MG PO TABS
50.0000 mg | ORAL_TABLET | Freq: Every day | ORAL | Status: DC
  Administered 2022-01-11: 50 mg via ORAL
  Filled 2022-01-11 (×3): qty 2

## 2022-01-11 MED ORDER — DIPHENHYDRAMINE-APAP (SLEEP) 25-500 MG PO TABS: 2.0000 | ORAL_TABLET | Freq: Every day | ORAL | Status: DC

## 2022-01-11 MED ORDER — CEFAZOLIN SODIUM-DEXTROSE 2-4 GM/100ML-% IV SOLN
2.0000 g | Freq: Four times a day (QID) | INTRAVENOUS | Status: AC
  Administered 2022-01-11 – 2022-01-12 (×2): 2 g via INTRAVENOUS
  Filled 2022-01-11: qty 100

## 2022-01-11 MED ORDER — DOCUSATE SODIUM 100 MG PO CAPS
100.0000 mg | ORAL_CAPSULE | Freq: Two times a day (BID) | ORAL | Status: DC
  Administered 2022-01-11 – 2022-01-12 (×2): 100 mg via ORAL
  Filled 2022-01-11 (×2): qty 1

## 2022-01-11 MED ORDER — CEFAZOLIN SODIUM-DEXTROSE 2-4 GM/100ML-% IV SOLN
INTRAVENOUS | Status: AC
  Filled 2022-01-11: qty 100

## 2022-01-11 MED ORDER — ONDANSETRON HCL 4 MG/2ML IJ SOLN: 4.0000 mg | Freq: Once | INTRAMUSCULAR | Status: DC | PRN

## 2022-01-11 MED ORDER — HYDROCODONE-ACETAMINOPHEN 5-325 MG PO TABS: 1.0000 | ORAL_TABLET | ORAL | Status: DC | PRN

## 2022-01-11 MED ORDER — DROPERIDOL 2.5 MG/ML IJ SOLN: 0.6250 mg | Freq: Once | INTRAMUSCULAR | Status: DC | PRN

## 2022-01-11 MED ORDER — MIDAZOLAM HCL 5 MG/5ML IJ SOLN
INTRAMUSCULAR | Status: DC | PRN
  Administered 2022-01-11 (×2): 2 mg via INTRAVENOUS

## 2022-01-11 MED ORDER — FENTANYL CITRATE (PF) 100 MCG/2ML IJ SOLN
INTRAMUSCULAR | Status: AC
  Filled 2022-01-11: qty 2

## 2022-01-11 MED ORDER — ACETAMINOPHEN 500 MG PO TABS
1000.0000 mg | ORAL_TABLET | Freq: Every day | ORAL | Status: DC
  Filled 2022-01-11: qty 2

## 2022-01-11 MED ORDER — LORATADINE 10 MG PO TABS
10.0000 mg | ORAL_TABLET | Freq: Every day | ORAL | Status: DC
  Administered 2022-01-12: 10 mg via ORAL
  Filled 2022-01-11: qty 1

## 2022-01-11 MED ORDER — TRANEXAMIC ACID-NACL 1000-0.7 MG/100ML-% IV SOLN
INTRAVENOUS | Status: AC
  Filled 2022-01-11: qty 100

## 2022-01-11 SURGICAL SUPPLY — 60 items
APL PRP STRL LF DISP 70% ISPRP (MISCELLANEOUS) ×2
BLADE SAGITTAL WIDE XTHICK NO (BLADE) ×1 IMPLANT
BNDG CMPR 5X4 CHSV STRCH STRL (GAUZE/BANDAGES/DRESSINGS)
BNDG COHESIVE 4X5 TAN STRL LF (GAUZE/BANDAGES/DRESSINGS) IMPLANT
BRUSH SCRUB EZ  4% CHG (MISCELLANEOUS) ×1
BRUSH SCRUB EZ 4% CHG (MISCELLANEOUS) ×1 IMPLANT
CHLORAPREP W/TINT 26 (MISCELLANEOUS) ×2 IMPLANT
COVER HOLE (Hips) IMPLANT
DRAPE 3/4 80X56 (DRAPES) ×1 IMPLANT
DRAPE C-ARM 42X72 X-RAY (DRAPES) ×1 IMPLANT
DRAPE INCISE 23X17 IOBAN STRL (DRAPES) ×1
DRAPE INCISE 23X17 STRL (DRAPES) IMPLANT
DRAPE INCISE IOBAN 23X17 STRL (DRAPES) ×1 IMPLANT
DRAPE STERI IOBAN 125X83 (DRAPES) IMPLANT
DRAPE U-SHAPE 47X51 STRL (DRAPES) ×1 IMPLANT
DRSG AQUACEL AG ADV 3.5X10 (GAUZE/BANDAGES/DRESSINGS) IMPLANT
DRSG AQUACEL AG ADV 3.5X14 (GAUZE/BANDAGES/DRESSINGS) IMPLANT
ELECT REM PT RETURN 9FT ADLT (ELECTROSURGICAL) ×1
ELECTRODE REM PT RTRN 9FT ADLT (ELECTROSURGICAL) ×1 IMPLANT
GAUZE 4X4 16PLY ~~LOC~~+RFID DBL (SPONGE) ×1 IMPLANT
GAUZE XEROFORM 1X8 LF (GAUZE/BANDAGES/DRESSINGS) IMPLANT
GLOVE BIO SURGEON STRL SZ8 (GLOVE) ×1 IMPLANT
GLOVE BIOGEL PI IND STRL 8.5 (GLOVE) ×2 IMPLANT
GLOVE PI ORTHO PRO STRL SZ8 (GLOVE) ×2 IMPLANT
GOWN STRL REUS W/ TWL XL LVL3 (GOWN DISPOSABLE) ×2 IMPLANT
GOWN STRL REUS W/TWL XL LVL3 (GOWN DISPOSABLE) ×2
HEAD OXINIUM PLUS 0 32MM (Hips) IMPLANT
HOOD PEEL AWAY FLYTE STAYCOOL (MISCELLANEOUS) ×3 IMPLANT
IV NS 250ML (IV SOLUTION) ×1
IV NS 250ML BAXH (IV SOLUTION) IMPLANT
IV NS IRRIG 3000ML ARTHROMATIC (IV SOLUTION) ×1 IMPLANT
KIT PATIENT CARE HANA TABLE (KITS) ×1 IMPLANT
KIT TURNOVER CYSTO (KITS) ×1 IMPLANT
LINER 3H HEMI SHELL 48MM (Liner) IMPLANT
LINER ACETABULAR 32X48 (Liner) IMPLANT
MANIFOLD NEPTUNE II (INSTRUMENTS) ×1 IMPLANT
MAT ABSORB  FLUID 56X50 GRAY (MISCELLANEOUS) ×1
MAT ABSORB FLUID 56X50 GRAY (MISCELLANEOUS) ×1 IMPLANT
NDL SAFETY ECLIP 18X1.5 (MISCELLANEOUS) IMPLANT
NDL SPNL 20GX3.5 QUINCKE YW (NEEDLE) ×1 IMPLANT
NEEDLE SPNL 20GX3.5 QUINCKE YW (NEEDLE) ×1 IMPLANT
PACK HIP PROSTHESIS (MISCELLANEOUS) ×1 IMPLANT
PADDING CAST BLEND 4X4 NS (MISCELLANEOUS) ×2 IMPLANT
PILLOW ABDUCTION MEDIUM (MISCELLANEOUS) ×1 IMPLANT
PULSAVAC PLUS IRRIG FAN TIP (DISPOSABLE) ×1
SCREW 6.5X25MM (Screw) IMPLANT
SOLUTION IRRIG SURGIPHOR (IV SOLUTION) ×1 IMPLANT
SPONGE T-LAP 18X18 ~~LOC~~+RFID (SPONGE) ×2 IMPLANT
STAPLER SKIN PROX 35W (STAPLE) ×1 IMPLANT
STEM LATERAL COLLAR LAT1 12/14 (Stem) IMPLANT
SUT BONE WAX W31G (SUTURE) ×1 IMPLANT
SUT DVC 2 QUILL PDO  T11 36X36 (SUTURE) ×1
SUT DVC 2 QUILL PDO T11 36X36 (SUTURE) ×1 IMPLANT
SUT VIC AB 2-0 CT1 18 (SUTURE) ×1 IMPLANT
SUT VIC AB PLUS 45CM 1-MO-4 (SUTURE) IMPLANT
SYR 30ML LL (SYRINGE) ×1 IMPLANT
TIP FAN IRRIG PULSAVAC PLUS (DISPOSABLE) ×1 IMPLANT
TRAP FLUID SMOKE EVACUATOR (MISCELLANEOUS) ×1 IMPLANT
WAND WEREWOLF FASTSEAL 6.0 (MISCELLANEOUS) ×1 IMPLANT
WATER STERILE IRR 500ML POUR (IV SOLUTION) ×1 IMPLANT

## 2022-01-11 NOTE — Anesthesia Procedure Notes (Addendum)
Spinal  Patient location during procedure: OR Start time: 01/11/2022 3:30 PM End time: 01/11/2022 3:35 PM Reason for block: surgical anesthesia Staffing Performed: anesthesiologist  Anesthesiologist: Darrin Nipper, MD Resident/CRNA: Cammie Sickle, CRNA Performed by: Darrin Nipper, MD Authorized by: Darrin Nipper, MD   Preanesthetic Checklist Completed: patient identified, IV checked, site marked, risks and benefits discussed, surgical consent, monitors and equipment checked, pre-op evaluation and timeout performed Spinal Block Patient position: sitting Prep: alcohol swabs Patient monitoring: cardiac monitor, continuous pulse ox and blood pressure Approach: midline Location: L3-4 Injection technique: single-shot Needle Needle type: Whitacre  Needle gauge: 22 G Needle length: 9 cm Assessment Sensory level: T4 Events: CSF return Additional Notes Two providers: Pidana encountered os at L4-5; Sontee Desena able to pass at L3-4 with Whitacre 22g

## 2022-01-11 NOTE — Addendum Note (Signed)
Addendum  created 01/11/22 1823 by Darrin Nipper, MD   Clinical Note Signed

## 2022-01-11 NOTE — H&P (Signed)
The patient has been re-examined, and the chart reviewed, and there have been no interval changes to the documented history and physical.  Plan a left total hip today.  Anesthesia is not consulted regarding a peripheral nerve block for post-operative pain.  The risks, benefits, and alternatives have been discussed at length, and the patient is willing to proceed.

## 2022-01-11 NOTE — Anesthesia Postprocedure Evaluation (Signed)
Anesthesia Post Note  Patient: [name]  Procedure(s) Performed: TOTAL HIP ARTHROPLASTY ANTERIOR APPROACH (Left: Hip)  Patient location during evaluation: PACU Anesthesia Type: Spinal Level of consciousness: awake and alert, oriented and patient cooperative Pain management: pain level controlled Vital Signs Assessment: post-procedure vital signs reviewed and stable Respiratory status: spontaneous breathing, nonlabored ventilation and respiratory function stable Cardiovascular status: blood pressure returned to baseline and stable Postop Assessment: adequate PO intake, no headache and spinal receding Anesthetic complications: no   No notable events documented.   Last Vitals:  Vitals:   01/11/22 1800 01/11/22 1815  BP: (!) 100/56 97/63  Pulse: 63 64  Resp: 10 (!) 22  Temp:    SpO2: 100% 100%    Last Pain:  Vitals:   01/11/22 1815  TempSrc:   PainSc: 0-No pain                 Darrin Nipper

## 2022-01-11 NOTE — Addendum Note (Signed)
Addendum  created 01/11/22 1743 by Darrin Nipper, MD   Delete clinical note

## 2022-01-11 NOTE — Op Note (Signed)
01/11/2022  5:24 PM  PATIENT:  PALMA BUSTER   MRN: [id number]  PRE-OPERATIVE DIAGNOSIS:  Osteoarthritis left hip   POST-OPERATIVE DIAGNOSIS: Same  Procedure: Left Total Hip Replacement  Surgeon: Elyn Aquas. Harlow Mares, MD   Assist: Carlynn Spry, PA-C  Anesthesia: Spinal   EBL: 200 mL   Specimens: None   Drains: None   Components used: A size 1 lateral Polarstem Smith and Nephew, R3 size 48 mm shell, and a 32 mm +0 mm head    Description of the procedure in detail: After informed consent was obtained and the appropriate extremity marked in the pre-operative holding area, the patient was taken to the operating room and placed in the supine position on the fracture table. All pressure points were well padded and bilateral lower extremities were place in traction spars. The hip was prepped and draped in standard sterile fashion. A spinal anesthetic had been delivered by the anesthesia team. The skin and subcutaneous tissues were injected with a mixture of Marcaine with epinephrine for post-operative pain. A longitudinal incision approximately 10 cm in length was carried out from the anterior superior iliac spine to the greater trochanter. The tensor fascia was divided and blunt dissection was taken down to the level of the joint capsule. The lateral circumflex vessels were cauterized. Deep retractors were placed and a portion of the anterior capsule was excised. Using fluoroscopy the neck cut was planned and carried out with a sagittal saw. The head was passed from the field with use of a corkscrew and hip skid. Deep retractors were placed along the acetabulum and the degenerative labrum and large osteophytes were removed with a Rongeur. The cup was sequentially reamed to a size 48 mm. The wound was irrigated and using fluoroscopy the size 48 mm cup was impacted in to anatomic position. A single screw was placed followed by a threaded hole cover. The final liner was impacted in to position.  Attention was then turned to the proximal femur. The leg was placed in extension and external rotation. The canal was opened and sequentially broached to a size 1 lateral. The trial components were placed and the hip relocated. The components were found to be in good position using fluoroscopy. The hip was dislocated and the trial components removed. The final components were impacted in to position and the hip relocated. The final components were again check with fluoroscopy and found to be in good position. Hemostasis was achieved with electrocautery. The deep capsule was injected with Marcaine and epinephrine. The wound was irrigated with bacitracin laced normal saline and the tensor fascia closed with #2 Quill suture. The subcutaneous tissues were closed with 2-0 vicryl and staples for the skin. A sterile dressing was applied and an abduction pillow. Patient tolerated the procedure well and there were no apparent complication. Patient was taken to the recovery room in good condition.   Kurtis Bushman, MD

## 2022-01-11 NOTE — Transfer of Care (Signed)
Immediate Anesthesia Transfer of Care Note  Patient: [name]  Procedure(s) Performed: TOTAL HIP ARTHROPLASTY ANTERIOR APPROACH (Left: Hip)  Patient Location: PACU  Anesthesia Type:Spinal  Level of Consciousness: awake, drowsy and patient cooperative  Airway & Oxygen Therapy: Patient Spontanous Breathing and Patient connected to face mask oxygen  Post-op Assessment: Report given to RN and Post -op Vital signs reviewed and stable  Post vital signs: Reviewed and stable  Last Vitals:  Vitals Value Taken Time  BP 109/65 01/11/22 1732  Temp 36.9 C 01/11/22 1732  Pulse 74 01/11/22 1735  Resp 17 01/11/22 1735  SpO2 100 % 01/11/22 1735  Vitals shown include unvalidated device data.  Last Pain:  Vitals:   01/11/22 1344  TempSrc:   PainSc: 6          Complications: No notable events documented.

## 2022-01-11 NOTE — Anesthesia Postprocedure Evaluation (Deleted)
Anesthesia Post Note  Patient: [name]  Procedure(s) Performed: TOTAL HIP ARTHROPLASTY ANTERIOR APPROACH (Left: Hip)  Patient location during evaluation: PACU Anesthesia Type: Spinal Level of consciousness: awake and alert, oriented and patient cooperative Pain management: pain level controlled Vital Signs Assessment: post-procedure vital signs reviewed and stable Respiratory status: spontaneous breathing, nonlabored ventilation and respiratory function stable Cardiovascular status: blood pressure returned to baseline and stable Postop Assessment: adequate PO intake Anesthetic complications: no   No notable events documented.   Last Vitals:  Vitals:   01/11/22 1343 01/11/22 1732  BP: 127/69 109/65  Pulse: 66 90  Resp: 15 16  Temp: 37 C 36.9 C  SpO2: 100% 100%    Last Pain:  Vitals:   01/11/22 1344  TempSrc:   PainSc: [place]

## 2022-01-11 NOTE — Anesthesia Preprocedure Evaluation (Addendum)
Anesthesia Evaluation  Patient identified by MRN, date of birth, ID band Patient awake    Reviewed: Allergy & Precautions, NPO status , Patient's Chart, lab work & pertinent test results  History of Anesthesia Complications (+) PONV and history of anesthetic complications  Airway Mallampati: III  TM Distance: >3 FB Neck ROM: full    Dental  (+) Chipped   Pulmonary asthma ,    Pulmonary exam normal        Cardiovascular hypertension, negative cardio ROS Normal cardiovascular exam     Neuro/Psych PSYCHIATRIC DISORDERS Anxiety Depression  Neuromuscular disease    GI/Hepatic Neg liver ROS, GERD  Medicated and Controlled,  Endo/Other  diabetesHypothyroidism LD GLP on 9/27  Renal/GU Renal disease     Musculoskeletal  (+) Arthritis , Fibromyalgia -  Abdominal   Peds  Hematology  (+) Blood dyscrasia, anemia ,   Anesthesia Other Findings Past Medical History: No date: Anemia No date: Anxiety No date: Arthritis     Comment:  inflammitory No date: Asthma No date: B12 deficiency No date: Back pain No date: Chronic joint pain No date: Constipation No date: DDD (degenerative disc disease), lumbar     Comment:  low back and neck and shoulders No date: Depression     Comment:  during divorce & legal matters No date: Fatty liver No date: Fibromyalgia No date: Gallbladder problem No date: GERD (gastroesophageal reflux disease) No date: History of kidney stones     Comment:  Dr Phebe Colla S/P BSO: History of polycystic ovarian disease No date: Hx of anxiety disorder No date: Hypertension     Comment:  a No date: Hypothyroidism     Comment:  no meds No date: IBS (irritable bowel syndrome) No date: IBS (irritable bowel syndrome) No date: Infertility, female No date: Joint pain No date: Kidney problem No date: Lactose intolerance hx positive ANA: Lupus ([place])     Comment:  followed by Dr Gavin Pound; possible  lupus No date: Multiple food allergies No date: Myalgia No date: Osteoarthritis 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, 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[place]) 03/02/2007: POLYCYSTIC OVARIAN DISEASE     Comment:  Qualifier: Diagnosis of  By: Linna Darner MD, Rae Mar               BSO for cysts & endometriosis; Dr Reino Kent, Gyn Seeing Dr               Paula Compton   No date: PONV (postoperative nausea and vomiting) No date: Pre-diabetes No date: Prediabetes No date: Sweating profusely No date: Symptomatic mammary hypertrophy No date: URI (upper respiratory infection)     Comment:  currently on cefdinir  Past Surgical History: No date: ABDOMINAL HYSTERECTOMY No date: ANKLE SURGERY     Comment:  X3 1991: APPENDECTOMY     Comment:  exploratory lap No date: BACK SURGERY 03/26/2015: BREAST REDUCTION SURGERY; Bilateral     Comment:  Procedure: BILATERAL BREAST REDUCTION ;  Surgeon: Wallace Going, DO;  Location: [place];                Service: Plastics;  Laterality: Bilateral; No date: CARPAL TUNNEL RELEASE     Comment:  bilateral; Dr Sherwood Gambler No date: COLONOSCOPY     Comment:  in 1990s 07-09-2004: CYSTO/ RIGHT RETROGRADE URETERAL PYELOGRAM     Comment:  HX BILATERAL RENAL STONES/ RIGHT FLANK PAIN 11/12/2011: CYSTOSCOPY W/ URETERAL STENT PLACEMENT  Comment:  Procedure: CYSTOSCOPY WITH RETROGRADE PYELOGRAM/URETERAL              STENT PLACEMENT;  Surgeon: Alexis Frock, MD;  Location:              [facility];  Service: Urology;  Laterality: Right; 12/07/2011: CYSTOSCOPY W/ URETERAL STENT REMOVAL     Comment:  Procedure: CYSTOSCOPY WITH STENT REMOVAL;  Surgeon:               Alexis Frock, MD;  Location: [facility];  Service: Urology;  Laterality: Right; 12/07/2011: CYSTOSCOPY/RETROGRADE/URETEROSCOPY/STONE EXTRACTION WITH  BASKET     Comment:  Procedure: CYSTOSCOPY/RETROGRADE/URETEROSCOPY/STONE               EXTRACTION WITH BASKET;  Surgeon: Alexis Frock, MD;                Location[facility];  Service: Urology;               Laterality: Right; No date: DILATION AND CURETTAGE OF UTERUS     Comment:  X5 02-05-2002: EXCISION LEFT BARTHOLIN GLAND No date: EYE SURGERY     Comment:  Retinal tears surgery bilateral 2000: LAPAROSCOPIC ASSISTED VAGINAL HYSTERECTOMY 05-31-2007: LAPAROSCOPIC CHOLECYSTECTOMY 11-03-1999: LAPAROSCOPIC LASER ABLATION ENDOMETRIOSIS AND LEFT  SALPINGO-OOPHORECTOMY 05-17-2001: LAPAROSCOPY WITH RIGHT SALPINGO-OOPHECTOMY/ LYSIS  ADHESIONS AND ABLATION ENDOMETRIOSIS 03/26/2015: LIPOSUCTION; Bilateral     Comment:  Procedure: WITH LIPOSUCTION;  Surgeon: Wallace Going, DO;  Location: [place];                Service: Plastics;  Laterality: Bilateral; 2003: LUMBAR LAMINECTOMY     Comment:  L4 - L5 2017: NECK SURGERY No date: PLANTAR FASCIA SURGERY     Comment:  right 02/2011: PLANTAR FASCIA SURGERY     Comment:  left No date: REDUCTION MAMMAPLASTY 08-04-2000: RIGHT URETEROSCOPIC STONE EXTRACTION 05/11/2020: SHOULDER ARTHROSCOPY WITH ROTATOR CUFF REPAIR; Right     Comment:  Procedure: SHOULDER ARTHROSCOPY WITH ROTATOR CUFF               REPAIR;  Surgeon: Lovell Sheehan, MD;  Location: [facility]               [facility];  Service: Orthopedics;  Laterality: Right; No date: TONSILLECTOMY 11/12/11: URETERAL STENT PLACEMENT     Comment:  Dr Tresa Moore     Reproductive/Obstetrics negative OB ROS                           Anesthesia Physical Anesthesia Plan  ASA: 3  Anesthesia Plan: Spinal   Post-op Pain Management:    Induction:   PONV Risk Score and Plan: 3 and Ondansetron, Dexamethasone, Midazolam and Treatment may vary due to age or medical condition  Airway Management Planned: Natural Airway and Nasal Cannula  Additional Equipment:   Intra-op Plan:   Post-operative Plan:   Informed Consent: I have reviewed the patients History and  Physical, chart, labs and discussed the procedure including the risks, benefits and alternatives for the proposed anesthesia with the patient or authorized representative who has indicated his/her understanding and acceptance.     Dental Advisory Given  Plan Discussed with: Anesthesiologist, CRNA and Surgeon  Anesthesia Plan Comments: (Patient reports no bleeding problems and no anticoagulant use.  Plan for spinal with backup GA  Patient consented for risks of anesthesia including but not limited to:  - adverse reactions to medications - damage to eyes, teeth, lips or other oral mucosa - nerve damage due to positioning  - risk of bleeding, infection and or nerve damage from spinal that could lead to paralysis - risk of headache or failed spinal - damage to teeth, lips or other oral mucosa - sore throat or hoarseness - damage to heart, brain, nerves, lungs, other parts of body or loss of life  Patient voiced understanding.)        Anesthesia Quick Evaluation

## 2022-01-12 ENCOUNTER — Encounter: Payer: Self-pay | Admitting: Orthopedic Surgery

## 2022-01-12 DIAGNOSIS — M1612 Unilateral primary osteoarthritis, left hip: Secondary | ICD-10-CM | POA: Diagnosis not present

## 2022-01-12 MED ORDER — HYDROCODONE-ACETAMINOPHEN 5-325 MG PO TABS: 1.0000 | ORAL_TABLET | ORAL | 0 refills | Status: DC | PRN

## 2022-01-12 MED ORDER — DOCUSATE SODIUM 100 MG PO CAPS: 100.0000 mg | ORAL_CAPSULE | Freq: Two times a day (BID) | ORAL | 0 refills | Status: DC

## 2022-01-12 MED ORDER — INFLUENZA VAC SPLIT QUAD 0.5 ML IM SUSY
0.5000 mL | PREFILLED_SYRINGE | Freq: Once | INTRAMUSCULAR | Status: AC
  Administered 2022-01-12: 0.5 mL via INTRAMUSCULAR
  Filled 2022-01-12 (×2): qty 0.5

## 2022-01-12 MED ORDER — INFLUENZA VAC SPLIT QUAD 0.5 ML IM SUSY: 0.5000 mL | PREFILLED_SYRINGE | INTRAMUSCULAR | Status: DC

## 2022-01-12 MED ORDER — DIPHENHYDRAMINE HCL 25 MG PO CAPS
25.0000 mg | ORAL_CAPSULE | Freq: Four times a day (QID) | ORAL | Status: DC | PRN
  Administered 2022-01-12: 25 mg via ORAL
  Filled 2022-01-12: qty 1

## 2022-01-12 MED ORDER — ASPIRIN 81 MG PO CHEW: 81.0000 mg | CHEWABLE_TABLET | Freq: Two times a day (BID) | ORAL | 0 refills | Status: DC

## 2022-01-12 NOTE — Plan of Care (Signed)

## 2022-01-12 NOTE — Discharge Summary (Signed)
Physician Discharge Summary  Patient ID: [name] MRN: [id number] DOB/AGE: 1970-11-14 51 y.o.  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, left  Discharge Diagnoses:  M16.12 Unilateral primary osteoarthritis, left hip Principal Problem:   History of total hip replacement, left   Past Medical History:  Diagnosis Date   Anemia    Anxiety    Arthritis    inflammitory   Asthma    B12 deficiency    Back pain    Chronic joint pain    Constipation    DDD (degenerative disc disease), lumbar    low back and neck and shoulders   Depression    during divorce & legal matters   Fatty liver    Fibromyalgia    Gallbladder problem    GERD (gastroesophageal reflux disease)    History of kidney stones    Dr Phebe Colla   History of polycystic ovarian disease S/P BSO   Hx of anxiety disorder    Hypertension    a   Hypothyroidism    no meds   IBS (irritable bowel syndrome)    IBS (irritable bowel syndrome)    Infertility, female    Joint pain    Kidney problem    Lactose intolerance    Lupus ([facility]) hx positive ANA   followed by Dr Gavin Pound; possible lupus   Multiple food allergies    Myalgia    Osteoarthritis    Polyarthritis, 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    She was given perioperative antibiotics:  Anti-infectives (From admission, onward)    Start     Dose/Rate Route Frequency Ordered Stop   01/12/22 0600  ceFAZolin (ANCEF) IVPB 2g/100 mL premix        2 g 200 mL/hr over 30 Minutes Intravenous On call to O.R. 01/11/22 1349 01/11/22 1547   01/11/22 2200  hydroxychloroquine (PLAQUENIL) tablet 200 mg        200 mg Oral 2 times daily 01/11/22 1907     01/11/22 2100  ceFAZolin (ANCEF) IVPB 2g/100 mL premix        2 g 200 mL/hr over 30 Minutes Intravenous Every 6 hours 01/11/22 1907 01/12/22 0544   01/11/22 1354  ceFAZolin (ANCEF) 2-4 GM/100ML-% IVPB       Note to Pharmacy: Trudie Reed S: cabinet override      01/11/22 1354 01/11/22 1604     .  She was given sequential compression devices, early ambulation, and aspirin 81 mg twice daily for 30 days for DVT prophylaxis.  She benefited maximally from the hospital stay and there were no complications.  Recent vital signs:  Vitals:   01/12/22 0426 01/12/22 0732  BP: 105/65 113/61  Pulse: 71 68  Resp: 20   Temp: (!) 97.2 F (36.2 C) 98.1 F (36.7 C)  SpO2: 98% 99%    Recent laboratory studies:  Lab Results  Component Value Date   HGB 12.1 12/09/2021   HGB 13.0 08/13/2020   HGB 13.6 04/28/2020   Lab Results  Component Value Date   WBC 6.6 12/09/2021   PLT 276 12/09/2021   No results found for: "INR" Lab Results  Component Value Date   NA 142 12/09/2021   K 4.3 12/09/2021   CL 105 12/09/2021   CO2 20 12/09/2021   BUN 22 12/09/2021   CREATININE 0.89 12/09/2021   GLUCOSE 76 12/09/2021    Discharge Medications:   Allergies as of 01/12/2022       Reactions   Chlorhexidine Gluconate Rash, Itching, Swelling   Developed severe rash where chloraprep was used on chest area   Ivp Dye [iodinated  Contrast Media] Rash, Other (See Comments)   Flushing, minor facial rash & dyspnea   Nalbuphine Shortness Of Breath, Rash   Nubain caused respiratory distress & rash   Septra [sulfamethoxazole-trimethoprim] Shortness Of Breath, Rash   Adhesive [tape] Rash   Cymbalta [duloxetine Hcl]    Had drastic mood changes   Diclofenac Other (See Comments)   GI Upset   Humira [adalimumab]    "Did not like the way I felt"   Methotrexate Derivatives    GI upset    Augmentin [amoxicillin-pot Clavulanate] Nausea And Vomiting   Betadine [povidone Iodine] Rash   Latex Rash   Otezla [apremilast] Rash        Medication List     TAKE these medications    acetaminophen 650 MG CR tablet Commonly known as: TYLENOL Take 1,300 mg by mouth every 8 (eight) hours as needed for pain.   albuterol 108 (90 Base) MCG/ACT inhaler Commonly known as: VENTOLIN HFA Inhale 2 puffs into the lungs every 6 (six) hours as needed for wheezing or shortness of breath. TAKE 2 PUFFS BY MOUTH EVERY 6 HOURS AS NEEDED FOR WHEEZE OR SHORTNESS OF BREATH   aspirin 81 MG chewable tablet Chew 1 tablet (81 mg total) by mouth 2 (two) times daily.   celecoxib 200 MG capsule Commonly known as: CELEBREX Take 200 mg by mouth daily.   citalopram 20 MG tablet Commonly known as: CELEXA TAKE 1 TABLET BY MOUTH EVERY DAY   cyclobenzaprine 10 MG tablet Commonly known as: FLEXERIL Take 10 mg by mouth 2 (two) times daily as needed.   diphenhydramine-acetaminophen 25-500 MG Tabs tablet Commonly known as: TYLENOL PM Take 2 tablets by mouth at bedtime.   docusate sodium 100 MG capsule Commonly known as: COLACE Take 1 capsule (100 mg total) by mouth 2 (two) times daily.   DULoxetine 30 MG capsule Commonly known as: CYMBALTA Take 30 mg by mouth daily.   EPINEPHrine 0.3 mg/0.3 mL Soaj injection Commonly known as: EPI-PEN See admin instructions.   famotidine 20 MG tablet Commonly known as: PEPCID TAKE 1 TABLET BY MOUTH EVERYDAY  AT BEDTIME   Fluocinolone Acetonide 0.01 % Oil Place 3-5 drops into both ears in the morning and at bedtime.   hydrochlorothiazide 12.5 MG capsule Commonly known as: MICROZIDE TAKE 1 CAPSULE BY MOUTH EVERY DAY   HYDROcodone-acetaminophen 5-325 MG tablet Commonly known as: NORCO/VICODIN Take 1 tablet by mouth every 4 (four) hours as needed for moderate pain (pain  score 4-6).   hydroxychloroquine 200 MG tablet Commonly known as: PLAQUENIL Take 200 mg by mouth 2 (two) times daily.   levocetirizine 5 MG tablet Commonly known as: XYZAL TAKE 1 TABLET BY MOUTH EVERY EVENING. ANNUAL APPT DUE IN JULY MUST SEE PROVIDER FOR FUTURE REFILLS   lisinopril 40 MG tablet Commonly known as: ZESTRIL Take 1 tablet (40 mg total) by mouth daily.   Melatonin 5 MG Chew Chew 1 tablet by mouth as needed.   metFORMIN 500 MG tablet Commonly known as: GLUCOPHAGE Take 1 tablet (500 mg total) by mouth 2 (two) times daily with a meal. What changed: when to take this   mometasone 0.1 % lotion Commonly known as: ELOCON Apply 2-4 drops topically at bedtime as needed.   ONE TOUCH ULTRA 2 w/Device Kit OneTouch Ultra2 Meter   OneTouch Delica Lancets 40G Misc 1 each by Does not apply route 2 (two) times daily.   Otezla 30 MG Tabs Generic drug: Apremilast   OVER THE COUNTER MEDICATION 1 tablet as needed. CBD gummy   Semaglutide (2 MG/DOSE) 8 MG/3ML Sopn Inject 2 mg as directed once a week.   topiramate 50 MG tablet Commonly known as: Topamax Take 1 tablet (50 mg total) by mouth daily.               Durable Medical Equipment  (From admission, onward)           Start     Ordered   01/12/22 0820  For home use only DME 3 n 1  Once        01/12/22 0819   01/12/22 0820  For home use only DME Walker rolling  Once       Question Answer Comment  Walker: With 5 Inch Wheels   Patient needs a walker to treat with the following condition Osteoarthritis of left hip      01/12/22 0819             Diagnostic Studies: DG HIP UNILAT WITH PELVIS 1V LEFT  Result Date: 01/11/2022 CLINICAL DATA:  Left hip arthroplasty. EXAM: DG HIP (WITH OR WITHOUT PELVIS) 1V*L* COMPARISON:  None Available. FINDINGS: Two fluoroscopic spot views of the pelvis and left hip obtained in the operating room during left hip arthroplasty. Fluoroscopy time 10 seconds. Dose 1.34 mGy. IMPRESSION: Intraoperative fluoroscopy during left hip arthroplasty. Electronically Signed   By: Keith Rake M.D.   On: 01/11/2022 17:20   DG C-Arm 1-60 Min-No Report  Result Date: 01/11/2022 Fluoroscopy was utilized by the requesting physician.  No radiographic interpretation.    Disposition: Discharge disposition: 01-Home or Self Care            Signed: Carlynn Spry ,PA-C 01/12/2022, 8:20 AM

## 2022-01-12 NOTE — Evaluation (Signed)
Physical Therapy Evaluation Patient Details Name: [name] MRN: [id number] DOB: Mar 01, 1971 Today's Date: 01/12/2022  History of Present Illness  Pt is a 51 yo female s/p L THA. PMH of asthma, HTN, anxiety, depression, fibromyalgia, lupus.  Clinical Impression  Patient alert up in chair at start of session, reported 5-6/10 L hip pain at end of session. She stated at baseline she is independent, family available to assist at discharge as needed.  Sit <> stand with RW and supervision, educated on hand placement with good carryover. She ambulated ~215f with RW and supervision. With cueing progressed to step through pattern, some antalgic gait noted. Pt most challenged by transferring on and off commode (low surface), reliance on grab bar, CGA. Pt and PT reviewed car transfers as well.  Overall the patient demonstrated deficits (see "PT Problem List") that impede the patient's functional abilities, safety, and mobility and would benefit from skilled PT intervention. Recommendation is HHPT with set up supervision/assistance.       Recommendations for follow up therapy are one component of a multi-disciplinary discharge planning process, led by the attending physician.  Recommendations may be updated based on patient status, additional functional criteria and insurance authorization.  Follow Up Recommendations Home health PT      Assistance Recommended at Discharge Set up Supervision/Assistance  Patient can return home with the following  A little help with bathing/dressing/bathroom;Assistance with cooking/housework;Assist for transportation;Help with stairs or ramp for entrance    Equipment Recommendations Rolling walker (2 wheels)  Recommendations for Other Services       Functional Status Assessment Patient has had a recent decline in their functional status and demonstrates the ability to make significant improvements in function in a reasonable and predictable amount of time.      Precautions / Restrictions Precautions Precautions: Fall Restrictions Weight Bearing Restrictions: Yes LLE Weight Bearing: Weight bearing as tolerated      Mobility  Bed Mobility               General bed mobility comments: pt up in chair at start/end of session    Transfers Overall transfer level: Needs assistance Equipment used: Rolling walker (2 wheels) Transfers: Sit to/from Stand Sit to Stand: Supervision           General transfer comment: educated on hand placement    Ambulation/Gait Ambulation/Gait assistance: Supervision Gait Distance (Feet): 200 Feet Assistive device: Rolling walker (2 wheels)         General Gait Details: antalgic, initially step to. did progress to step through with cues and time  Stairs            Wheelchair Mobility    Modified Rankin (Stroke Patients Only)       Balance Overall balance assessment: Needs assistance Sitting-balance support: Feet supported Sitting balance-Leahy Scale: Good     Standing balance support: Single extremity supported Standing balance-Leahy Scale: Good Standing balance comment: able to stand at sink and wash hands, no LOB                             Pertinent Vitals/Pain Pain Assessment Pain Assessment: 0-10 Pain Score: 6  Pain Location: L hip after ambulation Pain Descriptors / Indicators: Aching, Grimacing Pain Intervention(s): Limited activity within patient's tolerance, Monitored during session, Repositioned    Home Living Family/patient expects to be discharged to:: Private residence Living Arrangements: Spouse/significant other Available Help at Discharge: Family Type of Home: House Home Access:  Ramped entrance       Home Layout: One level Home Equipment: None      Prior Function Prior Level of Function : Independent/Modified Independent                     Hand Dominance        Extremity/Trunk Assessment   Upper Extremity  Assessment Upper Extremity Assessment: Overall WFL for tasks assessed    Lower Extremity Assessment Lower Extremity Assessment: RLE deficits/detail;LLE deficits/detail RLE Deficits / Details: WFLs LLE Deficits / Details: s/p L THA       Communication   Communication: No difficulties  Cognition Arousal/Alertness: Awake/alert Behavior During Therapy: WFL for tasks assessed/performed Overall Cognitive Status: Within Functional Limits for tasks assessed                                          General Comments      Exercises     Assessment/Plan    PT Assessment Patient needs continued PT services  PT Problem List Decreased strength;Decreased mobility;Decreased range of motion;Decreased activity tolerance;Decreased balance;Pain;Decreased knowledge of use of DME;Decreased knowledge of precautions       PT Treatment Interventions DME instruction;Therapeutic exercise;Gait training;Balance training;Stair training;Neuromuscular re-education;Functional mobility training;Therapeutic activities;Patient/family education    PT Goals (Current goals can be found in the Care Plan section)  Acute Rehab PT Goals Patient Stated Goal: to go home PT Goal Formulation: With patient Time For Goal Achievement: 01/26/22 Potential to Achieve Goals: Good    Frequency BID     Co-evaluation               AM-PAC PT "6 Clicks" Mobility  Outcome Measure Help needed turning from your back to your side while in a flat bed without using bedrails?: None Help needed moving from lying on your back to sitting on the side of a flat bed without using bedrails?: None Help needed moving to and from a bed to a chair (including a wheelchair)?: None Help needed standing up from a chair using your arms (e.g., wheelchair or bedside chair)?: None Help needed to walk in hospital room?: None Help needed climbing 3-5 steps with a railing? : A Little 6 Click Score: 23    End of Session  Equipment Utilized During Treatment: Gait belt Activity Tolerance: Patient tolerated treatment well Patient left: in chair;with call bell/phone within reach Nurse Communication: Mobility status PT Visit Diagnosis: Other abnormalities of gait and mobility (R26.89);Difficulty in walking, not elsewhere classified (R26.2);Muscle weakness (generalized) (M62.81);Pain Pain - Right/Left: Left Pain - part of body: Hip    Time: 0901-0919 PT Time Calculation (min) (ACUTE ONLY): 18 min   Charges:   PT Evaluation $PT Eval Low Complexity: 1 Low PT Treatments $Therapeutic Activity: 8-22 mins        Lieutenant Diego PT, DPT 10:26 AM,01/12/22

## 2022-01-12 NOTE — Discharge Instructions (Signed)

## 2022-01-12 NOTE — Progress Notes (Signed)
  Subjective:  Patient reports pain as mild to moderate.    Objective:   VITALS:   Vitals:   01/11/22 1947 01/11/22 2313 01/12/22 0426 01/12/22 0732  BP: 108/67 121/66 105/65 113/61  Pulse: 65 78 71 68  Resp: $Remo'20 20 20   'yfqkg$ Temp: 98.9 F (37.2 C) 98.5 F (36.9 C) (!) 97.2 F (36.2 C) 98.1 F (36.7 C)  TempSrc:    Oral  SpO2: 100% 97% 98% 99%    PHYSICAL EXAM:  Neurologically intact ABD soft Neurovascular intact Sensation intact distally Intact pulses distally Dorsiflexion/Plantar flexion intact Incision: dressing C/D/I No cellulitis present Compartment soft  LABS  Results for orders placed or performed during the hospital encounter of 01/11/22 (from the past 24 hour(s))  ABO/Rh     Status: None   Collection Time: 01/11/22  2:03 PM  Result Value Ref Range   ABO/RH(D)      Jenetta Downer NEG Performed at [facility], [place],  [address]     DG HIP UNILAT WITH PELVIS 1V LEFT  Result Date: 01/11/2022 CLINICAL DATA:  Left hip arthroplasty. EXAM: DG HIP (WITH OR WITHOUT PELVIS) 1V*L* COMPARISON:  None Available. FINDINGS: Two fluoroscopic spot views of the pelvis and left hip obtained in the operating room during left hip arthroplasty. Fluoroscopy time 10 seconds. Dose 1.34 mGy. IMPRESSION: Intraoperative fluoroscopy during left hip arthroplasty. Electronically Signed   By: Keith Rake M.D.   On: 01/11/2022 17:20   DG C-Arm 1-60 Min-No Report  Result Date: 01/11/2022 Fluoroscopy was utilized by the requesting physician.  No radiographic interpretation.    Assessment/Plan: 1 Day Post-Op   Principal Problem:   History of total hip replacement, left   Advance diet Up with therapy D/C home today if PT goals met   Carlynn Spry , PA-C 01/12/2022, 8:16 AM

## 2022-01-12 NOTE — Progress Notes (Signed)
Discharge instructions reviewed with patient and spouse. They verbalized understanding of instructions. Walker delivered to room

## 2022-01-12 NOTE — Progress Notes (Addendum)
Spoke with the patient She lives at home with her spouse, he will provide transportation and is here in the room with her She needs a rolling walker She has a raised toilet [facility] has gotten the  [facility] referral prior to Surgery

## 2022-01-13 LAB — SURGICAL PATHOLOGY

## 2022-01-19 DIAGNOSIS — J301 Allergic rhinitis due to pollen: Secondary | ICD-10-CM | POA: Diagnosis not present

## 2022-01-26 DIAGNOSIS — J301 Allergic rhinitis due to pollen: Secondary | ICD-10-CM | POA: Diagnosis not present

## 2022-01-28 DIAGNOSIS — M1990 Unspecified osteoarthritis, unspecified site: Secondary | ICD-10-CM | POA: Diagnosis not present

## 2022-01-28 DIAGNOSIS — M797 Fibromyalgia: Secondary | ICD-10-CM | POA: Diagnosis not present

## 2022-01-28 DIAGNOSIS — Z6832 Body mass index (BMI) 32.0-32.9, adult: Secondary | ICD-10-CM | POA: Diagnosis not present

## 2022-01-28 DIAGNOSIS — D649 Anemia, unspecified: Secondary | ICD-10-CM | POA: Diagnosis not present

## 2022-01-28 DIAGNOSIS — Z96642 Presence of left artificial hip joint: Secondary | ICD-10-CM | POA: Diagnosis not present

## 2022-01-28 DIAGNOSIS — I10 Essential (primary) hypertension: Secondary | ICD-10-CM | POA: Diagnosis not present

## 2022-01-28 DIAGNOSIS — J45909 Unspecified asthma, uncomplicated: Secondary | ICD-10-CM | POA: Diagnosis not present

## 2022-01-28 DIAGNOSIS — Z4789 Encounter for other orthopedic aftercare: Secondary | ICD-10-CM | POA: Diagnosis not present

## 2022-01-28 DIAGNOSIS — E039 Hypothyroidism, unspecified: Secondary | ICD-10-CM | POA: Diagnosis not present

## 2022-01-28 DIAGNOSIS — E669 Obesity, unspecified: Secondary | ICD-10-CM | POA: Diagnosis not present

## 2022-02-01 NOTE — Patient Instructions (Signed)

## 2022-02-01 NOTE — Progress Notes (Unsigned)
Subjective:   [name] is a 51 y.o. female who presents for Medicare Annual (Subsequent) preventive examination. I connected with  Karie Mainland on 02/02/22 by a audio enabled telemedicine application and verified that I am speaking with the correct person using two identifiers.  Patient Location: Home  Provider Location: Home Office  I discussed the limitations of evaluation and management by telemedicine. The patient expressed understanding and agreed to proceed.  Review of Systems    Deferred to PCP Cardiac Risk Factors include: advanced age (>54mn, >>63women);obesity (BMI >30kg/m2);hypertension;dyslipidemia     Objective:    Today's Vitals   02/02/22 1457  PainSc: 2    There is no height or weight on file to calculate BMI.     01/12/2022    6:17 AM 01/11/2022    1:45 PM 12/30/2021    1:28 PM 10/26/2020    1:09 PM 05/11/2020   10:27 AM 04/29/2020    9:09 AM 07/29/2015   12:53 PM  Advanced Directives  Does Patient Have a Medical Advance Directive? No No Yes Yes Yes Yes No  Type of Advance Directive    Living will;Healthcare Power of Attorney Living will Living will   Does patient want to make changes to medical advance directive?    No - Patient declined     Copy of [place] Chart?    No - copy requested     Would patient like information on creating a medical advance directive? No - Patient declined No - Patient declined     Yes - Educational materials given    Current Medications (verified) Outpatient Encounter Medications as of 02/02/2022  Medication Sig   acetaminophen (TYLENOL) 650 MG CR tablet Take 1,300 mg by mouth every 8 (eight) hours as needed for pain.   albuterol (VENTOLIN HFA) 108 (90 Base) MCG/ACT inhaler Inhale 2 puffs into the lungs every 6 (six) hours as needed for wheezing or shortness of breath. TAKE 2 PUFFS BY MOUTH EVERY 6 HOURS AS NEEDED FOR WHEEZE OR SHORTNESS OF BREATH   aspirin 81 MG chewable tablet Chew 1 tablet  (81 mg total) by mouth 2 (two) times daily.   Blood Glucose Monitoring Suppl (ONE TOUCH ULTRA 2) w/Device KIT OneTouch Ultra2 Meter   celecoxib (CELEBREX) 200 MG capsule Take 200 mg by mouth daily.   citalopram (CELEXA) 20 MG tablet TAKE 1 TABLET BY MOUTH EVERY DAY   cyclobenzaprine (FLEXERIL) 10 MG tablet Take 10 mg by mouth 2 (two) times daily as needed.   diphenhydramine-acetaminophen (TYLENOL PM) 25-500 MG TABS tablet Take 2 tablets by mouth at bedtime.   EPINEPHrine 0.3 mg/0.3 mL IJ SOAJ injection See admin instructions.   famotidine (PEPCID) 20 MG tablet TAKE 1 TABLET BY MOUTH EVERYDAY AT BEDTIME   Fluocinolone Acetonide 0.01 % OIL Place 3-5 drops into both ears in the morning and at bedtime.   hydrochlorothiazide (MICROZIDE) 12.5 MG capsule TAKE 1 CAPSULE BY MOUTH EVERY DAY   HYDROcodone-acetaminophen (NORCO/VICODIN) 5-325 MG tablet Take 1 tablet by mouth every 4 (four) hours as needed for moderate pain (pain score 4-6).   hydroxychloroquine (PLAQUENIL) 200 MG tablet Take 200 mg by mouth 2 (two) times daily.   levocetirizine (XYZAL) 5 MG tablet TAKE 1 TABLET BY MOUTH EVERY EVENING. ANNUAL APPT DUE IN JULY MUST SEE PROVIDER FOR FUTURE REFILLS   lisinopril (ZESTRIL) 40 MG tablet Take 1 tablet (40 mg total) by mouth daily.   Melatonin 5 MG CHEW Chew  1 tablet by mouth as needed.   metFORMIN (GLUCOPHAGE) 500 MG tablet Take 1 tablet (500 mg total) by mouth 2 (two) times daily with a meal. (Patient taking differently: Take 500 mg by mouth daily with breakfast.)   mometasone (ELOCON) 0.1 % lotion Apply 2-4 drops topically at bedtime as needed.   OneTouch Delica Lancets 10R MISC 1 each by Does not apply route 2 (two) times daily.   OVER THE COUNTER MEDICATION 1 tablet as needed. CBD gummy   Semaglutide, 2 MG/DOSE, 8 MG/3ML SOPN Inject 2 mg as directed once a week.   topiramate (TOPAMAX) 50 MG tablet Take 1 tablet (50 mg total) by mouth daily.   docusate sodium (COLACE) 100 MG capsule Take 1  capsule (100 mg total) by mouth 2 (two) times daily.   DULoxetine (CYMBALTA) 30 MG capsule Take 30 mg by mouth daily.   OTEZLA 30 MG TABS  (Patient not taking: Reported on 01/12/2022)   No facility-administered encounter medications on file as of 02/02/2022.    Allergies (verified) Chlorhexidine gluconate, Ivp dye [iodinated contrast media], Nalbuphine, Septra [sulfamethoxazole-trimethoprim], Adhesive [tape], Cymbalta [duloxetine hcl], Diclofenac, Humira [adalimumab], Methotrexate derivatives, Augmentin [amoxicillin-pot clavulanate], Betadine [povidone iodine], Latex, and Otezla [apremilast]   History: Past Medical History:  Diagnosis Date   Anemia    Anxiety    Arthritis    inflammitory   Asthma    B12 deficiency    Back pain    Chronic joint pain    Constipation    DDD (degenerative disc disease), lumbar    low back and neck and shoulders   Depression    during divorce & legal matters   Fatty liver    Fibromyalgia    Gallbladder problem    GERD (gastroesophageal reflux disease)    History of kidney stones    Dr Phebe Colla   History of polycystic ovarian disease S/P BSO   Hx of anxiety disorder    Hypertension    a   Hypothyroidism    no meds   IBS (irritable bowel syndrome)    IBS (irritable bowel syndrome)    Infertility, female    Joint pain    Kidney problem    Lactose intolerance    Lupus (HCC) hx positive ANA   followed by Dr Gavin Pound; possible lupus   Multiple food allergies    Myalgia    Osteoarthritis    Polyarthritis, 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[place])    POLYCYSTIC OVARIAN DISEASE 03/02/2007   Qualifier: Diagnosis of  By: Linna Darner MD, Rae Mar BSO for cysts & endometriosis; Dr Reino Kent, Gyn Seeing Dr Paula Compton     PONV (postoperative nausea and vomiting)    Pre-diabetes    Prediabetes    Sweating profusely    Symptomatic mammary hypertrophy    URI (upper respiratory infection)    currently on cefdinir   Past Surgical History:  Procedure Laterality  Date   ABDOMINAL HYSTERECTOMY     ANKLE SURGERY     X3   APPENDECTOMY  1991   exploratory lap   BACK SURGERY     BREAST REDUCTION SURGERY Bilateral 03/26/2015   Procedure: BILATERAL BREAST REDUCTION ;  Surgeon: Wallace Going, DO;  Location: [place];  Service: Plastics;  Laterality: Bilateral;   CARPAL TUNNEL RELEASE     bilateral; Dr Sherwood Gambler   COLONOSCOPY     in [place]  07-09-2004   HX BILATERAL RENAL STONES/ RIGHT FLANK  PAIN   CYSTOSCOPY W/ URETERAL STENT PLACEMENT  11/12/2011   Procedure: CYSTOSCOPY WITH RETROGRADE PYELOGRAM/URETERAL STENT PLACEMENT;  Surgeon: Alexis Frock, MD;  Location: [facility];  Service: Urology;  Laterality: Right;   CYSTOSCOPY W/ URETERAL STENT REMOVAL  12/07/2011   Procedure: CYSTOSCOPY WITH STENT REMOVAL;  Surgeon: Alexis Frock, MD;  [facility];  Service: Urology;  Laterality: Right;   CYSTOSCOPY/RETROGRADE/URETEROSCOPY/STONE EXTRACTION WITH BASKET  12/07/2011   Procedure: CYSTOSCOPY/RETROGRADE/URETEROSCOPY/STONE EXTRACTION WITH BASKET;  Surgeon: Alexis Frock, MD;  Location: [facility];  Service: Urology;  Laterality: Right;   DILATION AND CURETTAGE OF UTERUS     X5   EXCISION LEFT BARTHOLIN GLAND  02-05-2002   EYE SURGERY     Retinal tears surgery bilateral   LAPAROSCOPIC ASSISTED VAGINAL HYSTERECTOMY  2000   LAPAROSCOPIC CHOLECYSTECTOMY  05-31-2007   LAPAROSCOPIC LASER ABLATION ENDOMETRIOSIS AND LEFT SALPINGO-OOPHORECTOMY  11-03-1999   LAPAROSCOPY WITH RIGHT SALPINGO-OOPHECTOMY/ LYSIS ADHESIONS AND ABLATION ENDOMETRIOSIS  05-17-2001   LIPOSUCTION Bilateral 03/26/2015   Procedure: WITH LIPOSUCTION;  Surgeon: Wallace Going, DO;  Location: [place];  Service: Plastics;  Laterality: Bilateral;   LUMBAR LAMINECTOMY  2003   L4 - L5   NECK SURGERY  2017   PLANTAR FASCIA SURGERY     right   PLANTAR FASCIA SURGERY  02/2011    left   REDUCTION MAMMAPLASTY     RIGHT URETEROSCOPIC STONE EXTRACTION  08-04-2000   SHOULDER ARTHROSCOPY WITH ROTATOR CUFF REPAIR Right 05/11/2020   Procedure: SHOULDER ARTHROSCOPY WITH ROTATOR CUFF REPAIR;  Surgeon: Lovell Sheehan, MD;  Location: [facility];  Service: Orthopedics;  Laterality: Right;   TONSILLECTOMY     TOTAL HIP ARTHROPLASTY Left 01/11/2022   Procedure: TOTAL HIP ARTHROPLASTY ANTERIOR APPROACH;  Surgeon: Lovell Sheehan, MD;  Location: [facility];  Service: Orthopedics;  Laterality: Left;   URETERAL STENT PLACEMENT  11/12/11   Dr Tresa Moore   Family History  Problem Relation Age of Onset   Hypertension Mother    Colon polyps Mother    Atrial fibrillation Mother        Dr Angelena Form   Hyperlipidemia Mother    Heart disease Mother    Anxiety disorder Mother    Sleep apnea Mother    Obesity Mother    Heart attack Father 72   Diabetes Father    Hypertension Father    Hyperlipidemia Father    Heart disease Father    Stroke Father    Obesity Father    Hypertension Sister    Heart attack Paternal Grandmother 19   Breast cancer Paternal Aunt 56   Colon cancer Maternal Uncle    Colon cancer Paternal Uncle    Colon polyps Maternal Grandmother    Stroke Maternal Grandmother 76   Diabetes Paternal Grandfather    Stroke Maternal Grandfather 37   Social History   Socioeconomic History   Marital status: Married    Spouse name: Darnelle Maffucci   Number of children: 0   Years of education: college   Highest education level: Not on file  Occupational History   Occupation: disabled  Tobacco Use   Smoking status: Never   Smokeless tobacco: Never  Vaping Use   Vaping Use: Never used  Substance and Sexual Activity   Alcohol use: Yes    Alcohol/week: 1.0 standard drink of alcohol    Types: 1 Shots of liquor per week    Comment:  very rarely   Drug use: No  Sexual activity: Yes    Birth control/protection: Surgical  Other Topics Concern   Not on file  Social History Narrative    Patient Lives at home with her husband Darnelle Maffucci)   Disabled.   Education two years of college.   Right handed.   Caffeine coffee and sweet tea. Not daily.   Social Determinants of Health   Financial Resource Strain: Low Risk  (02/02/2022)   Overall Financial Resource Strain (CARDIA)    Difficulty of Paying Living Expenses: Not hard at all  Food Insecurity: No Food Insecurity (02/02/2022)   Hunger Vital Sign    Worried About Running Out of Food in the Last Year: Never true    Ran Out of Food in the Last Year: Never true  Transportation Needs: No Transportation Needs (02/02/2022)   PRAPARE - Hydrologist (Medical): No    Lack of Transportation (Non-Medical): No  Physical Activity: Insufficiently Active (02/02/2022)   Exercise Vital Sign    Days of Exercise per Week: 3 days    Minutes of Exercise per Session: 30 min  Stress: No Stress Concern Present (02/02/2022)   [place]    Feeling of Stress : Only a little  Social Connections: Socially Integrated (02/02/2022)   Social Connection and Isolation Panel [NHANES]    Frequency of Communication with Friends and Family: More than three times a week    Frequency of Social Gatherings with Friends and Family: More than three times a week    Attends Religious Services: More than 4 times per year    Active Member of [employer] or Organizations: Yes    Attends Music therapist: More than 4 times per year    Marital Status: Married    Tobacco Counseling Counseling given: Not Answered   Clinical Intake:  Pre-visit preparation completed: Yes  Pain : 0-10 Pain Score: 2  Pain Type: Other (Comment) (recent hip surgery) Pain Location: Hip Pain Orientation: Left Pain Descriptors / Indicators: Aching, Discomfort Pain Relieving Factors: tylenol  Pain Relieving Factors: tylenol  Nutritional Status: BMI > 30  Obese Nutritional Risks:  None Diabetes: No  How often do you need to have someone help you when you read instructions, pamphlets, or other written materials from your doctor or pharmacy?: 1 - Never What is the last grade level you completed in school?: college  Diabetic?Yes Nutrition Risk Assessment:  Has the patient had any N/V/D within the last 2 months?  No  Does the patient have any non-healing wounds?  No  Has the patient had any unintentional weight loss or weight gain?  No   Diabetes:  Is the patient diabetic?  No ,patient states she has never had an A1c above 6 If diabetic, was a CBG obtained today?  No  Did the patient bring in their glucometer from home?  No  How often do you monitor your CBG's? no.   Financial Strains and Diabetes Management:  Are you having any financial strains with the device, your supplies or your medication? No .  Does the patient want to be seen by Chronic Care Management for management of their diabetes?  No  Would the patient like to be referred to a Nutritionist or for Diabetic Management?  No   Diabetic Exams:  Diabetic Eye Exam: Completed Dr. Leeanne Deed every 6 months Diabetic Foot Exam: Overdue, Pt has been advised about the importance in completing this exam. Pt is scheduled for diabetic  foot exam on deferred to PCP.   Interpreter Needed?: No  Information entered by :: Emelia Loron RN   Activities of Daily Living    02/02/2022    3:07 PM 01/12/2022    6:17 AM  In your present state of health, do you have any difficulty performing the following activities:  Hearing? 0 0  Vision? 0 0  Difficulty concentrating or making decisions? 0 0  Walking or climbing stairs? 1 1  Dressing or bathing? 0 0  Doing errands, shopping? 0 0  Preparing Food and eating ? N   Using the Toilet? N   In the past six months, have you accidently leaked urine? N   Do you have problems with loss of bowel control? N   Managing your Medications? N   Managing your Finances? N    Housekeeping or managing your Housekeeping? N     Patient Care Team: Binnie Rail, MD as PCP - General (Internal Medicine) Burnell Blanks, MD as PCP - Cardiology (Cardiology) Gavin Pound, MD as Consulting Physician (Rheumatology) Charlton Haws, [facility] as Pharmacist (Pharmacist)  Indicate any recent Medical Services you may have received from other than [facility] providers in the past year (date may be approximate).     Assessment:   This is a routine wellness examination for [place].  Hearing/Vision screen No results found.  Dietary issues and exercise activities discussed: Current Exercise Habits: Home exercise routine, Type of exercise: walking (PT after), Time (Minutes): 30, Frequency (Times/Week): 3, Weekly Exercise (Minutes/Week): 90, Intensity: Mild, Exercise limited by: orthopedic condition(s)   Goals Addressed             This Visit's Progress    Patient Stated       I want to continue work with towards improving my pain and lose weight.      Depression Screen    10/29/2021   12:55 PM 04/28/2021    1:10 PM 10/26/2020    4:54 PM 10/26/2020    1:08 PM 04/22/2020    1:49 PM 12/24/2019   10:53 AM 05/01/2019   10:58 AM  PHQ 2/9 Scores  PHQ - 2 Score 0 0 0 0 0 0 0  PHQ- 9 Score 0 _0 Fall Risk    02/02/2022    3:16 PM 10/29/2021   12:55 PM 10/26/2020    1:12 PM 05/01/2019   10:57 AM 07/29/2015   12:54 PM  Fall Risk   Falls in the past year? 0 _1 Yes  Number falls in past yr: 0 _2 or more  Injury with Fall? 0 0 0 0 No  Risk for fall due to : History of fall(s);Impaired balance/gait;Impaired mobility No Fall Risks     Follow up Falls evaluation completed Falls evaluation completed   Education provided    [place]:  Any stairs in or around the home? Yes  If so, are there any without handrails? Yes  Home free of loose throw rugs in walkways, pet beds, electrical cords, etc? Yes  Adequate  lighting in your home to reduce risk of falls? Yes   ASSISTIVE DEVICES UTILIZED TO PREVENT FALLS:  Life alert? No  Use of a cane, walker or w/c? Yes  Grab bars in the bathroom? Yes  Shower chair or bench in shower? Yes  Elevated toilet seat or a handicapped toilet? Yes   Cognitive Function:  02/02/2022    3:11 PM 09/22/2014   10:18 AM  MMSE - Mini Mental State Exam  Not completed: Refused Unable to complete        Immunizations Immunization History  Administered Date(s) Administered   Influenza Split 02/17/2011   Influenza Whole 01/02/2010   Influenza,inj,Quad PF,6+ Mos 01/02/2013, 01/11/2017, 01/03/2018, 02/07/2019, 01/12/2022   Influenza-Unspecified 01/01/2014, 01/08/2016, 01/11/2017, 03/04/2020, 01/18/2021   PFIZER(Purple Top)SARS-COV-2 Vaccination 07/16/2019, 08/06/2019, 03/12/2020   Pneumococcal Polysaccharide-23 06/04/2008, 05/02/2013   Tdap 08/14/2012   Zoster Recombinat (Shingrix) 08/23/2020    TDAP status: Up to date  Flu Vaccine status: Up to date  Covid-19 vaccine status: Information provided on how to obtain vaccines.   Qualifies for Shingles Vaccine? Yes   Zostavax completed No   Shingrix Completed?: No.    Education has been provided regarding the importance of this vaccine. Patient has been advised to call insurance company to determine out of pocket expense if they have not yet received this vaccine. Advised may also receive vaccine at local pharmacy or Health Dept. Verbalized acceptance and understanding.  Screening Tests Health Maintenance  Topic Date Due   FOOT EXAM  Never done   OPHTHALMOLOGY EXAM  Never done   Diabetic kidney evaluation - Urine ACR  Never done   Hepatitis C Screening  Never done   COVID-19 Vaccine (4 - Pfizer risk series) 05/07/2020   Zoster Vaccines- Shingrix (2 of 2) 10/18/2020   HEMOGLOBIN A1C  06/09/2022   TETANUS/TDAP  08/15/2022   Diabetic kidney evaluation - GFR measurement  12/10/2022   Medicare Annual Wellness  (AWV)  02/03/2023   MAMMOGRAM  09/01/2023   COLONOSCOPY (Pts 45-12yr Insurance coverage will need to be confirmed)  07/14/2029   INFLUENZA VACCINE  Completed   HIV Screening  Completed   HPV VACCINES  Aged Out    Health Maintenance  Health Maintenance Due  Topic Date Due   FOOT EXAM  Never done   OPHTHALMOLOGY EXAM  Never done   Diabetic kidney evaluation - Urine ACR  Never done   Hepatitis C Screening  Never done   COVID-19 Vaccine (4 - Pfizer risk series) 05/07/2020   Zoster Vaccines- Shingrix (2 of 2) 10/18/2020    Colorectal cancer screening: Type of screening: Colonoscopy. Completed 07/15/19. Repeat every 10 years  Mammogram status: Completed 08/31/21. Repeat every year  Lung Cancer Screening: (Low Dose CT Chest recommended if Age 110-80years, 30 pack-year currently smoking OR have quit w/in 15years.) does not qualify.   Additional Screening:  Hepatitis C Screening: does qualify; Completed education provided  Vision Screening: Recommended annual ophthalmology exams for early detection of glaucoma and other disorders of the eye. Is the patient up to date with their annual eye exam?  Yes  Who is the provider or what is the name of the office in which the patient attends annual eye exams? Dr. RLeeanne DeedIf pt is not established with a provider, would they like to be referred to a provider to establish care?  N/A .   Dental Screening: Recommended annual dental exams for proper oral hygiene  Community Resource Referral / Chronic Care Management: CRR required this visit?  No   CCM required this visit?  No      Plan:     I have personally reviewed and noted the following in the patient's chart:   Medical and social history Use of alcohol, tobacco or illicit drugs  Current medications and supplements including opioid prescriptions. Patient is currently taking opioid prescriptions.  Information provided to patient regarding non-opioid alternatives. Patient advised to discuss  non-opioid treatment plan with their provider. Ms. Smigelski , Thank you for taking time to come for your Medicare Wellness Visit. I appreciate your ongoing commitment to your health goals. Please review the following plan we discussed and let me know if I can assist you in the future.   These are the goals we discussed:  Goals       Patient Stated     Patient Stated (pt-stated)      My goal is to get rid of the extra weight around my waist.      Other     Exercise 150 minutes per week (moderate activity)      Loves to swim; will start doing laps; 30 laps is a mile; start as tolerated and build in time       Manage My Medicine      Timeframe:  Long-Range Goal Priority:  Medium Start Date:    10/26/20                         Expected End Date:     02/26/22                  Follow Up Date Feb 2023   - call for medicine refill 2 or 3 days before it runs out - call if I am sick and can't take my medicine - keep a list of all the medicines I take; vitamins and herbals too  -Avoid screens within 1 hour of bedtime -Increase Ozempic to 1 mg gradually   Why is this important?   These steps will help you keep on track with your medicines.   Notes:       Patient Stated      I want to continue work with towards improving my pain and lose weight.      Weight < 190 lb (86.183 kg)      Will continue to lose weight and eat well  http://healthyeating.sfgate.com/recommended-grams-nutrients-per-day-healthy-weight-loss-6294.html Carbs provide 4 calories per gram. Therefore, you'd need 135 to 195 grams of carbohydrates when consuming a 1,200-calorie diet, and 180 to 260 grams of carbs when following a 1,600-calorie weight loss plan.   Protein contains 4 calories per gram. Many well-balanced, reduced-calorie diets contain about 20 percent protein, which is equivalent to 60 grams of protein for a 1,200-calorie diet and 80 grams of protein when following a 1,600-calorie weight loss plan.   The  Institute of Medicine encourages adults to consume 20 to 35 percent of their total calories from fat. If you consume 50 percent of your energy intake from carbs and 20 percent from protein, aim to eat 30 percent of your calories from fat, which is equivalent to 40 grams when following a 1,200-calorie weight loss diet and 53 grams of fat when consuming 1,600 calories per day.       Weight < 200 lb (90.719 kg)      Goal: cut back on sweet tea; Cut back on ice cream from every day to 3 to 4 times a week; 4 scoops to 2 scoops Drinking water; Will start back exercising when caregiving role is completed  The BMI (body mass index) is calculated from your height and weight. BMI is an estimate of body fat and a good gauge of your risk for diseases that can occur with more body fat. The higher your BMI, the more risk you have for  heart disease, high blood pressure, type 2 diabetes, gallstones, breathing problems and certain cancers.   A high BMI in conjunction with other risk put you at greater risk for heart disease and other conditions Risk include High Blood pressure High LDL  (bad cholesterol) Low HDL (good cholesterol) High Triglycerides High Blood Glucose Family hx of premature heart disease Physical Inactivity  Cigarette smoking  Even a small weight loss will help lower your risk of developing disease associated with obesity.  Your goal today is based on decreasing one of these risk.          This is a list of the screening recommended for you and due dates:  Health Maintenance  Topic Date Due   Complete foot exam   Never done   Eye exam for diabetics  Never done   Yearly kidney health urinalysis for diabetes  Never done   Hepatitis C Screening: USPSTF Recommendation to screen - Ages 13-79 yo.  Never done   COVID-19 Vaccine (4 - Pfizer risk series) 05/07/2020   Zoster (Shingles) Vaccine (2 of 2) 10/18/2020   Hemoglobin A1C  06/09/2022   Tetanus Vaccine  08/15/2022   Yearly  kidney function blood test for diabetes  12/10/2022   Medicare Annual Wellness Visit  02/03/2023   Mammogram  09/01/2023   Colon Cancer Screening  07/14/2029   Flu Shot  Completed   HIV Screening  Completed   HPV Vaccine  Aged Out    Functional ability and status Nutritional status Physical activity Advanced directives List of other physicians Hospitalizations, surgeries, and ER visits in previous 12 months Vitals Screenings to include cognitive, depression, and falls Referrals and appointments  In addition, I have reviewed and discussed with patient certain preventive protocols, quality metrics, and best practice recommendations. A written personalized care plan for preventive services as well as general preventive health recommendations were provided to patient.     Michiel Cowboy, RN   02/02/2022   Nurse Notes:  Ms. Quijas , Thank you for taking time to come for your Medicare Wellness Visit. I appreciate your ongoing commitment to your health goals. Please review the following plan we discussed and let me know if I can assist you in the future.   These are the goals we discussed:  Goals       Patient Stated     Patient Stated (pt-stated)      My goal is to get rid of the extra weight around my waist.      Other     Exercise 150 minutes per week (moderate activity)      Loves to swim; will start doing laps; 30 laps is a mile; start as tolerated and build in time       Manage My Medicine      Timeframe:  Long-Range Goal Priority:  Medium Start Date:    10/26/20                         Expected End Date:     02/26/22                  Follow Up Date Feb 2023   - call for medicine refill 2 or 3 days before it runs out - call if I am sick and can't take my medicine - keep a list of all the medicines I take; vitamins and herbals too  -Avoid screens within 1 hour of bedtime -Increase Ozempic to  1 mg gradually   Why is this important?   These steps will help you keep on  track with your medicines.   Notes:       Patient Stated      I want to continue work with towards improving my pain and lose weight.      Weight < 190 lb (86.183 kg)      Will continue to lose weight and eat well  http://[email or web address].sfgate.com/recommended-grams-nutrients-per-day-healthy-weight-loss-6294.html Carbs provide 4 calories per gram. Therefore, you'd need 135 to 195 grams of carbohydrates when consuming a 1,200-calorie diet, and 180 to 260 grams of carbs when following a 1,600-calorie weight loss plan.   Protein contains 4 calories per gram. Many well-balanced, reduced-calorie diets contain about 20 percent protein, which is equivalent to 60 grams of protein for a 1,200-calorie diet and 80 grams of protein when following a 1,600-calorie weight loss plan.   The [employer] encourages adults to consume 20 to 35 percent of their total calories from fat. If you consume 50 percent of your energy intake from carbs and 20 percent from protein, aim to eat 30 percent of your calories from fat, which is equivalent to 40 grams when following a 1,200-calorie weight loss diet and 53 grams of fat when consuming 1,600 calories per day.       Weight < 200 lb (90.719 kg)      Goal: cut back on sweet tea; Cut back on ice cream from every day to 3 to 4 times a week; 4 scoops to 2 scoops Drinking water; Will start back exercising when caregiving role is completed  The BMI (body mass index) is calculated from your height and weight. BMI is an estimate of body fat and a good gauge of your risk for diseases that can occur with more body fat. The higher your BMI, the more risk you have for heart disease, high blood pressure, type 2 diabetes, gallstones, breathing problems and certain cancers.   A high BMI in conjunction with other risk put you at greater risk for heart disease and other conditions Risk include High Blood pressure High LDL  (bad cholesterol) Low HDL (good  cholesterol) High Triglycerides High Blood Glucose Family hx of premature heart disease Physical Inactivity  Cigarette smoking  Even a small weight loss will help lower your risk of developing disease associated with obesity.  Your goal today is based on decreasing one of these risk.          This is a list of the screening recommended for you and due dates:  Health Maintenance  Topic Date Due   Complete foot exam   Never done   Eye exam for diabetics  Never done   Yearly kidney health urinalysis for diabetes  Never done   Hepatitis C Screening: USPSTF Recommendation to screen - Ages 91-79 yo.  Never done   COVID-19 Vaccine (4 - Pfizer risk series) 05/07/2020   Zoster (Shingles) Vaccine (2 of 2) 10/18/2020   Hemoglobin A1C  06/09/2022   Tetanus Vaccine  08/15/2022   Yearly kidney function blood test for diabetes  12/10/2022   Medicare Annual Wellness Visit  02/03/2023   Mammogram  09/01/2023   Colon Cancer Screening  07/14/2029   Flu Shot  Completed   HIV Screening  Completed   HPV Vaccine  Aged Out

## 2022-02-02 ENCOUNTER — Encounter: Payer: Self-pay | Admitting: Internal Medicine

## 2022-02-02 ENCOUNTER — Ambulatory Visit (INDEPENDENT_AMBULATORY_CARE_PROVIDER_SITE_OTHER): Payer: PPO | Admitting: *Deleted

## 2022-02-02 DIAGNOSIS — I10 Essential (primary) hypertension: Secondary | ICD-10-CM | POA: Diagnosis not present

## 2022-02-02 DIAGNOSIS — Z6832 Body mass index (BMI) 32.0-32.9, adult: Secondary | ICD-10-CM | POA: Diagnosis not present

## 2022-02-02 DIAGNOSIS — E039 Hypothyroidism, unspecified: Secondary | ICD-10-CM | POA: Diagnosis not present

## 2022-02-02 DIAGNOSIS — Z96642 Presence of left artificial hip joint: Secondary | ICD-10-CM | POA: Diagnosis not present

## 2022-02-02 DIAGNOSIS — D649 Anemia, unspecified: Secondary | ICD-10-CM | POA: Diagnosis not present

## 2022-02-02 DIAGNOSIS — Z0000 Encounter for general adult medical examination without abnormal findings: Secondary | ICD-10-CM

## 2022-02-02 DIAGNOSIS — J45909 Unspecified asthma, uncomplicated: Secondary | ICD-10-CM | POA: Diagnosis not present

## 2022-02-02 DIAGNOSIS — M797 Fibromyalgia: Secondary | ICD-10-CM | POA: Diagnosis not present

## 2022-02-02 DIAGNOSIS — Z4789 Encounter for other orthopedic aftercare: Secondary | ICD-10-CM | POA: Diagnosis not present

## 2022-02-02 DIAGNOSIS — M1990 Unspecified osteoarthritis, unspecified site: Secondary | ICD-10-CM | POA: Diagnosis not present

## 2022-02-02 DIAGNOSIS — E669 Obesity, unspecified: Secondary | ICD-10-CM | POA: Diagnosis not present

## 2022-02-03 ENCOUNTER — Ambulatory Visit (INDEPENDENT_AMBULATORY_CARE_PROVIDER_SITE_OTHER): Payer: PPO | Admitting: Family Medicine

## 2022-02-03 ENCOUNTER — Encounter (INDEPENDENT_AMBULATORY_CARE_PROVIDER_SITE_OTHER): Payer: Self-pay | Admitting: Family Medicine

## 2022-02-03 VITALS — BP 123/80 | HR 93 | Temp 98.8°F | Ht 68.0 in | Wt 223.0 lb

## 2022-02-03 DIAGNOSIS — F3289 Other specified depressive episodes: Secondary | ICD-10-CM

## 2022-02-03 DIAGNOSIS — R7303 Prediabetes: Secondary | ICD-10-CM | POA: Diagnosis not present

## 2022-02-03 DIAGNOSIS — I10 Essential (primary) hypertension: Secondary | ICD-10-CM | POA: Diagnosis not present

## 2022-02-03 DIAGNOSIS — K5909 Other constipation: Secondary | ICD-10-CM | POA: Diagnosis not present

## 2022-02-03 DIAGNOSIS — Z96642 Presence of left artificial hip joint: Secondary | ICD-10-CM | POA: Diagnosis not present

## 2022-02-03 DIAGNOSIS — Z6834 Body mass index (BMI) 34.0-34.9, adult: Secondary | ICD-10-CM

## 2022-02-03 DIAGNOSIS — E669 Obesity, unspecified: Secondary | ICD-10-CM | POA: Diagnosis not present

## 2022-02-03 MED ORDER — TOPIRAMATE 50 MG PO TABS: 50.0000 mg | ORAL_TABLET | Freq: Every day | ORAL | 0 refills | Status: DC

## 2022-02-03 MED ORDER — SEMAGLUTIDE (2 MG/DOSE) 8 MG/3ML ~~LOC~~ SOPN: 2.0000 mg | PEN_INJECTOR | SUBCUTANEOUS | 0 refills | Status: DC

## 2022-02-03 MED ORDER — LISINOPRIL 40 MG PO TABS: 20.0000 mg | ORAL_TABLET | Freq: Every day | ORAL | 1 refills | Status: DC

## 2022-02-14 NOTE — Progress Notes (Unsigned)
Chief Complaint:   OBESITY [name] is here to discuss her progress with her obesity treatment plan along with follow-up of her obesity related diagnoses. [name] is on keeping a food journal and adhering to recommended goals of 1200 calories and 75 grams of protein daily and states she is following her eating plan approximately 50% of the time. [name] states she is doing 0 minutes 0 times per week.  Today's visit was #: 81 Starting weight: 241 lbs Starting date: 09/28/2016 Today's weight: 223 lbs Today's date: 02/03/2022 Total lbs lost to date: 18 Total lbs lost since last in-office visit: 4  Interim History: [name] is doing well from surgery.  She has done well with her weight loss.  Subjective:   1. Prediabetes [name]'s most recent A1c was 5.5, and her highest A1c was 6.1.  She is taking metformin daily with no side effects noted, and she is taking Ozempic with no side effects noted.  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  She is doing physical therapy and she has done well with post-op.  She is taking Tylenol only for pain now.-  3. Other constipation [name] took [place] and Dulcolax earlier.  She is still having hard and less frequent BMs.  4. Essential hypertension [name]'s blood pressure is running low at 90 over 60s and she was feeling dizzy after surgery and decreased lisinopril to 20 mg daily.  5. Other depression with emotional eating [name] is taking Topamax with no side effects noted.  Assessment/Plan:   1. Prediabetes [name] will continue metformin and her eating plan, and we will refill Ozempic for 1 month.  - Semaglutide, 2 MG/DOSE, 8 MG/3ML SOPN; Inject 2 mg as directed once a week.  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  3. Other constipation [name] will continue MiraLAX as  needed.  4. Essential hypertension [name] will continue to decrease lisinopril to 20 mg daily, and she will continue her eating plan and exercise as she is able.  5. Other depression with emotional eating [name] will continue Topamax 50 mg once daily, and we will refill for 90 days. - topiramate (TOPAMAX) 50 MG tablet; Take 1 tablet (50 mg total) by mouth daily.  Dispense: 90 tablet; Refill: 0  6. Obesity, Current BMI 34.5 [name] is currently in the action stage of change. As such, her goal is to continue with weight loss efforts. She has agreed to keeping a food journal and adhering to recommended goals of 1200 calories and 75+ grams of protein daily.   Exercise goals: As is, doing physical therapy after left hip replacement.   Behavioral modification strategies: increasing lean protein intake, decreasing simple carbohydrates, increasing water intake, holiday eating strategies , and planning for success.  [name] has agreed to follow-up with our clinic in 4 to 5 weeks. She was informed of the importance of frequent follow-up visits to maximize her success with intensive lifestyle modifications for her multiple health conditions.   Objective:   Blood pressure 123/80, pulse 93, temperature 98.8 F (37.1 C), height '5\' 8"'$  (1.727 m), weight 223 lb (101.2 kg), SpO2 98 %. Body mass index is 33.91 kg/m.  General: Cooperative, alert, well developed, in no acute distress. HEENT: Conjunctivae and lids unremarkable. Cardiovascular: Regular rhythm.  Lungs: Normal work of breathing. Neurologic: No focal deficits.   Lab Results  Component Value  Date   CREATININE 0.89 12/09/2021   BUN 22 12/09/2021   NA 142 12/09/2021   K 4.3 12/09/2021   CL 105 12/09/2021   CO2 20 12/09/2021   Lab Results  Component Value Date   ALT 21 12/09/2021   AST 20 12/09/2021   ALKPHOS 102 12/09/2021   BILITOT 0.2 12/09/2021   Lab Results  Component Value Date   HGBA1C 5.5 12/09/2021   HGBA1C 5.5  04/27/2021   HGBA1C 5.7 (H) 12/31/2020   HGBA1C 5.8 (H) 08/13/2020   HGBA1C 5.9 04/28/2020   Lab Results  Component Value Date   INSULIN 13.0 12/09/2021   INSULIN 28.5 (H) 04/27/2021   INSULIN 17.9 12/31/2020   INSULIN 14.2 08/13/2020   INSULIN 25.1 (H) 04/30/2020   Lab Results  Component Value Date   TSH 2.270 12/09/2021   Lab Results  Component Value Date   CHOL 144 12/09/2021   HDL 55 12/09/2021   LDLCALC 74 12/09/2021   TRIG 78 12/09/2021   CHOLHDL 3 08/16/2016   Lab Results  Component Value Date   VD25OH 46.6 12/09/2021   VD25OH 35.1 04/27/2021   VD25OH 45.9 12/31/2020   Lab Results  Component Value Date   WBC 6.6 12/09/2021   HGB 12.1 12/09/2021   HCT 37.1 12/09/2021   MCV 87 12/09/2021   PLT 276 12/09/2021   No results found for: "IRON", "TIBC", "FERRITIN"  Attestation Statements:   Reviewed by clinician on day of visit: allergies, medications, problem list, medical history, surgical history, family history, social history, and previous encounter notes.   I, Trixie Dredge, am acting as transcriptionist for Dennard Nip, MD.  I have reviewed the above documentation for accuracy and completeness, and I agree with the above. -  Dennard Nip, MD

## 2022-02-21 DIAGNOSIS — Z96642 Presence of left artificial hip joint: Secondary | ICD-10-CM | POA: Diagnosis not present

## 2022-03-10 ENCOUNTER — Encounter (INDEPENDENT_AMBULATORY_CARE_PROVIDER_SITE_OTHER): Payer: Self-pay | Admitting: Family Medicine

## 2022-03-10 ENCOUNTER — Ambulatory Visit (INDEPENDENT_AMBULATORY_CARE_PROVIDER_SITE_OTHER): Payer: PPO | Admitting: Family Medicine

## 2022-03-10 VITALS — BP 121/74 | HR 79 | Temp 99.2°F | Ht 68.0 in | Wt 222.0 lb

## 2022-03-10 DIAGNOSIS — Z7984 Long term (current) use of oral hypoglycemic drugs: Secondary | ICD-10-CM | POA: Diagnosis not present

## 2022-03-10 DIAGNOSIS — K581 Irritable bowel syndrome with constipation: Secondary | ICD-10-CM | POA: Diagnosis not present

## 2022-03-10 DIAGNOSIS — Z6833 Body mass index (BMI) 33.0-33.9, adult: Secondary | ICD-10-CM

## 2022-03-10 DIAGNOSIS — E1169 Type 2 diabetes mellitus with other specified complication: Secondary | ICD-10-CM | POA: Diagnosis not present

## 2022-03-10 DIAGNOSIS — Z6836 Body mass index (BMI) 36.0-36.9, adult: Secondary | ICD-10-CM

## 2022-03-10 DIAGNOSIS — E669 Obesity, unspecified: Secondary | ICD-10-CM | POA: Diagnosis not present

## 2022-03-10 DIAGNOSIS — F3289 Other specified depressive episodes: Secondary | ICD-10-CM

## 2022-03-10 DIAGNOSIS — E66812 Morbid (severe) obesity due to excess calories: Secondary | ICD-10-CM

## 2022-03-10 MED ORDER — TOPIRAMATE 50 MG PO TABS: 50.0000 mg | ORAL_TABLET | Freq: Every day | ORAL | 0 refills | Status: DC

## 2022-03-10 MED ORDER — METFORMIN HCL 500 MG PO TABS: 500.0000 mg | ORAL_TABLET | Freq: Two times a day (BID) | ORAL | 0 refills | Status: DC

## 2022-03-10 MED ORDER — SEMAGLUTIDE (2 MG/DOSE) 8 MG/3ML ~~LOC~~ SOPN: 2.0000 mg | PEN_INJECTOR | SUBCUTANEOUS | 0 refills | Status: DC

## 2022-03-10 MED ORDER — LINACLOTIDE 72 MCG PO CAPS: 72.0000 ug | ORAL_CAPSULE | Freq: Every day | ORAL | 0 refills | Status: DC

## 2022-03-12 ENCOUNTER — Other Ambulatory Visit: Payer: Self-pay | Admitting: Internal Medicine

## 2022-03-14 DIAGNOSIS — Z79899 Other long term (current) drug therapy: Secondary | ICD-10-CM | POA: Diagnosis not present

## 2022-03-14 DIAGNOSIS — H5213 Myopia, bilateral: Secondary | ICD-10-CM | POA: Diagnosis not present

## 2022-03-14 DIAGNOSIS — H52223 Regular astigmatism, bilateral: Secondary | ICD-10-CM | POA: Diagnosis not present

## 2022-03-20 ENCOUNTER — Other Ambulatory Visit: Payer: Self-pay | Admitting: Internal Medicine

## 2022-03-20 DIAGNOSIS — J309 Allergic rhinitis, unspecified: Secondary | ICD-10-CM

## 2022-03-21 NOTE — Progress Notes (Unsigned)
Chief Complaint:   OBESITY [name] is here to discuss her progress with her obesity treatment plan along with follow-up of her obesity related diagnoses. [name] is on keeping a food journal and adhering to recommended goals of 1200 calories and 75+ grams of protein and states she is following her eating plan approximately 50% of the time. Camyah states she is doing 0 minutes 0 times per week.  Today's visit was #: 3 Starting weight: 241 lbs Starting date: 6/274/2018 Today's weight: 222 lbs Today's date: 03/10/2022 Total lbs lost to date: 19 Total lbs lost since last in-office visit: 1  Interim History: [name] has done well with avoiding holiday weight gain, and she has even lost some weight.  Her hunger is controlled.  Subjective:   1. Irritable bowel syndrome with constipation [name]'s constipation has significantly worsened.  She has been on a low-dose Linzess in the past.  Her last BM was approximately 4 to 5 days ago.  No improvement with enema or laxatives.  She requests a refill of Linzess to help until she waits 3 weeks to see her PCP.  2. Type 2 diabetes mellitus with other specified complication, unspecified whether long term insulin use ([name]) [name] is doing well on her medications outside of increased constipation, which appears more related to her IBS-C than side effects of Ozempic.  3. Other depression with emotional eating [name] is on Topamax, and she notes dysgeusia with carbonated liquids as expected.  Assessment/Plan:   1. Irritable bowel syndrome with constipation Lavon agreed to restart Linzess 72 mcg once daily, and we will refill for 1 month.  She will follow-up with her PCP for further evaluation.  - linaclotide (LINZESS) 72 MCG capsule; Take 1 capsule (72 mcg total) by mouth daily before breakfast.  Dispense: 30 capsule; Refill: 0  2. Type 2 diabetes mellitus with other specified complication, unspecified whether long term insulin use  ([place]) [name] will continue her medications, and we will refill metformin and Ozempic for 90 days.  - metFORMIN (GLUCOPHAGE) 500 MG tablet; Take 1 tablet (500 mg total) by mouth 2 (two) times daily with a meal.  Dispense: 180 tablet; Refill: 0 - Semaglutide, 2 MG/DOSE, 8 MG/3ML SOPN; Inject 2 mg as directed once a week.  Dispense: 9 mL; Refill: 0  3. Other depression with emotional eating Malashia will continue Topamax 50 mg once daily, and we will refill for 90 days.  - topiramate (TOPAMAX) 50 MG tablet; Take 1 tablet (50 mg total) by mouth daily.  Dispense: 90 tablet; Refill: 0  4. Obesity, Current BMI 33.9 [name] is currently in the action stage of change. As such, her goal is to continue with weight loss efforts. She has agreed to keeping a food journal and adhering to recommended goals of 1200 calories and 75+ grams of protein daily.   Behavioral modification strategies: increasing lean protein intake, increasing water intake, increasing high fiber foods, and celebration eating strategies.  [name] has agreed to follow-up with our clinic in 4 to 6 weeks. She was informed of the importance of frequent follow-up visits to maximize her success with intensive lifestyle modifications for her multiple health conditions.   Objective:   Blood pressure 121/74, pulse 79, temperature 99.2 F (37.3 C), height '5\' 8"'$  (1.727 m), weight 222 lb (100.7 kg), SpO2 93 %. Body mass index is 33.75 kg/m.  General: Cooperative, alert, well developed, in no acute distress. HEENT: Conjunctivae and lids unremarkable. Cardiovascular: Regular rhythm.  Lungs: Normal work of  breathing. Neurologic: No focal deficits.   Lab Results  Component Value Date   CREATININE 0.89 12/09/2021   BUN 22 12/09/2021   NA 142 12/09/2021   K 4.3 12/09/2021   CL 105 12/09/2021   CO2 20 12/09/2021   Lab Results  Component Value Date   ALT 21 12/09/2021   AST 20 12/09/2021   ALKPHOS 102 12/09/2021   BILITOT 0.2  12/09/2021   Lab Results  Component Value Date   HGBA1C 5.5 12/09/2021   HGBA1C 5.5 04/27/2021   HGBA1C 5.7 (H) 12/31/2020   HGBA1C 5.8 (H) 08/13/2020   HGBA1C 5.9 04/28/2020   Lab Results  Component Value Date   INSULIN 13.0 12/09/2021   INSULIN 28.5 (H) 04/27/2021   INSULIN 17.9 12/31/2020   INSULIN 14.2 08/13/2020   INSULIN 25.1 (H) 04/30/2020   Lab Results  Component Value Date   TSH 2.270 12/09/2021   Lab Results  Component Value Date   CHOL 144 12/09/2021   HDL 55 12/09/2021   LDLCALC 74 12/09/2021   TRIG 78 12/09/2021   CHOLHDL 3 08/16/2016   Lab Results  Component Value Date   VD25OH 46.6 12/09/2021   VD25OH 35.1 04/27/2021   VD25OH 45.9 12/31/2020   Lab Results  Component Value Date   WBC 6.6 12/09/2021   HGB 12.1 12/09/2021   HCT 37.1 12/09/2021   MCV 87 12/09/2021   PLT 276 12/09/2021   No results found for: "IRON", "TIBC", "FERRITIN"  Attestation Statements:   Reviewed by clinician on day of visit: allergies, medications, problem list, medical history, surgical history, family history, social history, and previous encounter notes.   I, Trixie Dredge, am acting as transcriptionist for Dennard Nip, MD.  I have reviewed the above documentation for accuracy and completeness, and I agree with the above. -  Dennard Nip, MD

## 2022-04-05 ENCOUNTER — Other Ambulatory Visit: Payer: Self-pay | Admitting: Internal Medicine

## 2022-04-06 ENCOUNTER — Encounter (INDEPENDENT_AMBULATORY_CARE_PROVIDER_SITE_OTHER): Payer: PPO | Admitting: Internal Medicine

## 2022-04-06 DIAGNOSIS — K5909 Other constipation: Secondary | ICD-10-CM

## 2022-04-07 DIAGNOSIS — K5900 Constipation, unspecified: Secondary | ICD-10-CM | POA: Diagnosis not present

## 2022-04-07 MED ORDER — LINACLOTIDE 145 MCG PO CAPS: 145.0000 ug | ORAL_CAPSULE | Freq: Every day | ORAL | 1 refills | Status: DC

## 2022-04-07 NOTE — Telephone Encounter (Signed)
Please see the MyChart message reply(ies) for my assessment and plan.   The patient gave consent for this Medical Advice Message and is aware that it may result in a bill to their insurance company as well as the possibility that this may result in a co-payment or deductible. They are an established patient, but are not seeking medical advice exclusively about a problem treated during an in person or video visit in the last 7 days. I did not recommend an in person or video visit within 7 days of my reply.  Reviewed chart.  Has known chronic constipation. Agree with trying higher dose of medication.  Will re-evaluate at next visit - sooner if needed.   I spent a total of 5 minutes cumulative time within 7 days through [place], MD

## 2022-04-12 ENCOUNTER — Other Ambulatory Visit (INDEPENDENT_AMBULATORY_CARE_PROVIDER_SITE_OTHER): Payer: Self-pay | Admitting: Family Medicine

## 2022-04-12 ENCOUNTER — Telehealth (INDEPENDENT_AMBULATORY_CARE_PROVIDER_SITE_OTHER): Payer: Self-pay | Admitting: Family Medicine

## 2022-04-12 DIAGNOSIS — E1169 Type 2 diabetes mellitus with other specified complication: Secondary | ICD-10-CM

## 2022-04-12 NOTE — Telephone Encounter (Signed)
PA for Ozempic has been submitted via Cover[employer]ymeds. Awaiting a response.

## 2022-04-13 NOTE — Telephone Encounter (Signed)
[name] Stump-Lahm (Key: [id number])  This request has been approved.

## 2022-04-15 DIAGNOSIS — J301 Allergic rhinitis due to pollen: Secondary | ICD-10-CM | POA: Diagnosis not present

## 2022-04-18 DIAGNOSIS — J301 Allergic rhinitis due to pollen: Secondary | ICD-10-CM | POA: Diagnosis not present

## 2022-04-21 ENCOUNTER — Encounter (INDEPENDENT_AMBULATORY_CARE_PROVIDER_SITE_OTHER): Payer: Self-pay | Admitting: Family Medicine

## 2022-04-21 ENCOUNTER — Ambulatory Visit (INDEPENDENT_AMBULATORY_CARE_PROVIDER_SITE_OTHER): Payer: PPO | Admitting: Family Medicine

## 2022-04-21 VITALS — BP 101/70 | HR 77 | Temp 98.0°F | Ht 68.0 in | Wt 222.0 lb

## 2022-04-21 DIAGNOSIS — E669 Obesity, unspecified: Secondary | ICD-10-CM | POA: Diagnosis not present

## 2022-04-21 DIAGNOSIS — F3289 Other specified depressive episodes: Secondary | ICD-10-CM

## 2022-04-21 DIAGNOSIS — Z6833 Body mass index (BMI) 33.0-33.9, adult: Secondary | ICD-10-CM | POA: Diagnosis not present

## 2022-04-21 DIAGNOSIS — Z7984 Long term (current) use of oral hypoglycemic drugs: Secondary | ICD-10-CM | POA: Diagnosis not present

## 2022-04-21 DIAGNOSIS — E559 Vitamin D deficiency, unspecified: Secondary | ICD-10-CM | POA: Diagnosis not present

## 2022-04-21 DIAGNOSIS — E1169 Type 2 diabetes mellitus with other specified complication: Secondary | ICD-10-CM

## 2022-04-21 MED ORDER — SEMAGLUTIDE (2 MG/DOSE) 8 MG/3ML ~~LOC~~ SOPN: 2.0000 mg | PEN_INJECTOR | SUBCUTANEOUS | 0 refills | Status: DC

## 2022-04-21 MED ORDER — METFORMIN HCL 500 MG PO TABS: 500.0000 mg | ORAL_TABLET | Freq: Two times a day (BID) | ORAL | 0 refills | Status: DC

## 2022-04-21 MED ORDER — TOPIRAMATE 50 MG PO TABS: 50.0000 mg | ORAL_TABLET | Freq: Every day | ORAL | 0 refills | Status: DC

## 2022-04-22 LAB — HEMOGLOBIN A1C
Est. average glucose Bld gHb Est-mCnc: 108 mg/dL
Hgb A1c MFr Bld: 5.4 % (ref 4.8–5.6)

## 2022-04-22 LAB — CMP14+EGFR
ALT: 18 IU/L (ref 0–32)
AST: 21 IU/L (ref 0–40)
Albumin/Globulin Ratio: 1.5 (ref 1.2–2.2)
Albumin: 4.3 g/dL (ref 3.8–4.9)
Alkaline Phosphatase: 108 IU/L (ref 44–121)
BUN/Creatinine Ratio: 26 — ABNORMAL HIGH (ref 9–23)
BUN: 25 mg/dL — ABNORMAL HIGH (ref 6–24)
Bilirubin Total: 0.3 mg/dL (ref 0.0–1.2)
CO2: 21 mmol/L (ref 20–29)
Calcium: 9.5 mg/dL (ref 8.7–10.2)
Chloride: 101 mmol/L (ref 96–106)
Creatinine, Ser: 0.97 mg/dL (ref 0.57–1.00)
Globulin, Total: 2.9 g/dL (ref 1.5–4.5)
Glucose: 77 mg/dL (ref 70–99)
Potassium: 4 mmol/L (ref 3.5–5.2)
Sodium: 138 mmol/L (ref 134–144)
Total Protein: 7.2 g/dL (ref 6.0–8.5)
eGFR: 70 mL/min/{1.73_m2} (ref >59)

## 2022-04-22 LAB — VITAMIN D 25 HYDROXY (VIT D DEFICIENCY, FRACTURES): Vit D, 25-Hydroxy: 28.4 ng/mL — ABNORMAL LOW (ref 30.0–100.0)

## 2022-04-22 LAB — INSULIN, RANDOM: INSULIN: 13.8 u[IU]/mL (ref 2.6–24.9)

## 2022-04-26 NOTE — Progress Notes (Unsigned)
Chief Complaint:   OBESITY [name] is here to discuss her progress with her obesity treatment plan along with follow-up of her obesity related diagnoses. [name] is on {MWMwtlossportion/plan2:23431} and states she is following her eating plan approximately ***% of the time. [name] states she is *** *** minutes *** times per week.  Today's visit was #: *** Starting weight: *** Starting date: *** Today's weight: *** Today's date: 04/21/2022 Total lbs lost to date: *** Total lbs lost since last in-office visit: ***  Interim History: ***  Subjective:   1. Type 2 diabetes mellitus with other specified complication, unspecified whether long term insulin use (HCC) ***  2. Vitamin D deficiency ***  3. Emotional Eating Behavior ***  Assessment/Plan:   1. Type 2 diabetes mellitus with other specified complication, unspecified whether long term insulin use (HCC) *** - metFORMIN (GLUCOPHAGE) 500 MG tablet; Take 1 tablet (500 mg total) by mouth 2 (two) times daily with a meal.  Dispense: 180 tablet; Refill: 0 - Semaglutide, 2 MG/DOSE, 8 MG/3ML SOPN; Inject 2 mg as directed once a week.  Dispense: 9 mL; Refill: 0 - Hemoglobin A1c - Insulin, random - CMP14+EGFR  2. Vitamin D deficiency *** - VITAMIN D 25 Hydroxy (Vit-D Deficiency, Fractures)  3. Emotional Eating Behavior *** - topiramate (TOPAMAX) 50 MG tablet; Take 1 tablet (50 mg total) by mouth daily.  Dispense: 90 tablet; Refill: 0  4. BMI 33.0-33.9,adult  5. Obesity, Beginning BMI 36.64 Damiya is currently in the action stage of change. As such, her goal is to continue with weight loss efforts. She has agreed to keeping a food journal and adhering to recommended goals of 1200 calories and 75 grams of protein daily.   Behavioral modification strategies: increasing lean protein intake and keeping a strict food journal.  [name] has agreed to follow-up with our clinic in 4 to 6 weeks. She was informed of the  importance of frequent follow-up visits to maximize her success with intensive lifestyle modifications for her multiple health conditions.   [name] was informed we would discuss her lab results at her next visit unless there is a critical issue that needs to be addressed sooner. [name] agreed to keep her next visit at the agreed upon time to discuss these results.  Objective:   Blood pressure 101/70, pulse 77, temperature 98 F (36.7 C), height '5\' 8"'$  (1.727 m), weight 222 lb (100.7 kg), SpO2 98 %. Body mass index is 33.75 kg/m.  General: Cooperative, alert, well developed, in no acute distress. HEENT: Conjunctivae and lids unremarkable. Cardiovascular: Regular rhythm.  Lungs: Normal work of breathing. Neurologic: No focal deficits.   Lab Results  Component Value Date   CREATININE 0.97 04/21/2022   BUN 25 (H) 04/21/2022   NA 138 04/21/2022   K 4.0 04/21/2022   CL 101 04/21/2022   CO2 21 04/21/2022   Lab Results  Component Value Date   ALT 18 04/21/2022   AST 21 04/21/2022   ALKPHOS 108 04/21/2022   BILITOT 0.3 04/21/2022   Lab Results  Component Value Date   HGBA1C 5.4 04/21/2022   HGBA1C 5.5 12/09/2021   HGBA1C 5.5 04/27/2021   HGBA1C 5.7 (H) 12/31/2020   HGBA1C 5.8 (H) 08/13/2020   Lab Results  Component Value Date   INSULIN 13.8 04/21/2022   INSULIN 13.0 12/09/2021   INSULIN 28.5 (H) 04/27/2021   INSULIN 17.9 12/31/2020   INSULIN 14.2 08/13/2020   Lab Results  Component Value Date   TSH 2.270  12/09/2021   Lab Results  Component Value Date   CHOL 144 12/09/2021   HDL 55 12/09/2021   LDLCALC 74 12/09/2021   TRIG 78 12/09/2021   CHOLHDL 3 08/16/2016   Lab Results  Component Value Date   VD25OH 28.4 (L) 04/21/2022   VD25OH 46.6 12/09/2021   VD25OH 35.1 04/27/2021   Lab Results  Component Value Date   WBC 6.6 12/09/2021   HGB 12.1 12/09/2021   HCT 37.1 12/09/2021   MCV 87 12/09/2021   PLT 276 12/09/2021   No results found for: "IRON",  "TIBC", "FERRITIN"  Attestation Statements:   Reviewed by clinician on day of visit: allergies, medications, problem list, medical history, surgical history, family history, social history, and previous encounter notes.   I, Trixie Dredge, am acting as transcriptionist for Dennard Nip, MD.  I have reviewed the above documentation for accuracy and completeness, and I agree with the above. -  ***

## 2022-05-02 DIAGNOSIS — L4050 Arthropathic psoriasis, unspecified: Secondary | ICD-10-CM | POA: Diagnosis not present

## 2022-05-02 DIAGNOSIS — L409 Psoriasis, unspecified: Secondary | ICD-10-CM | POA: Diagnosis not present

## 2022-05-02 DIAGNOSIS — Z7960 Long term (current) use of unspecified immunomodulators and immunosuppressants: Secondary | ICD-10-CM | POA: Diagnosis not present

## 2022-05-02 DIAGNOSIS — M159 Polyosteoarthritis, unspecified: Secondary | ICD-10-CM | POA: Diagnosis not present

## 2022-05-02 DIAGNOSIS — M25811 Other specified joint disorders, right shoulder: Secondary | ICD-10-CM | POA: Diagnosis not present

## 2022-05-09 ENCOUNTER — Encounter: Payer: Self-pay | Admitting: Cardiovascular Disease

## 2022-05-12 NOTE — Progress Notes (Signed)
Chief Complaint  Patient presents with   Follow-up    HTN    History of Present Illness: 52 yo female with history of anxiety, depression, fibromyalgia, GERD, polycystic ovarian disease, HTN, hypothyroidism, IBS and lupus who is here today for cardiac follow up. I saw her as a new patient for the evaluation of dyspnea on exertion in February 2023. I take care of her mother Bethena Roys Stump. She described dyspnea with exertion but no chest pain. She has multiple family member with CAD. She has never smoked. Coronary CTA 06/11/21 with no evidence of CAD, calcium score of zero. Echo March 2023 with LVEF=55-60%. No valve disease.   She is here today for follow up. The patient denies any chest pain, dyspnea, palpitations, lower extremity edema, orthopnea, PND, dizziness, near syncope or syncope.   Primary Care Physician: Binnie Rail, MD  Past Medical History:  Diagnosis Date   Anemia    Anxiety    Arthritis    inflammitory   Asthma    B12 deficiency    Back pain    Chronic joint pain    Constipation    DDD (degenerative disc disease), lumbar    low back and neck and shoulders   Depression    during divorce & legal matters   Fatty liver    Fibromyalgia    Gallbladder problem    GERD (gastroesophageal reflux disease)    History of kidney stones    Dr Phebe Colla   History of polycystic ovarian disease S/P BSO   Hx of anxiety disorder    Hypertension    a   Hypothyroidism    no meds   IBS (irritable bowel syndrome)    IBS (irritable bowel syndrome)    Infertility, female    Joint pain    Kidney problem    Lactose intolerance    Lupus (HCC) hx positive ANA   followed by Dr Gavin Pound; possible lupus   Multiple food allergies    Myalgia    Osteoarthritis    Polyarthritis, 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[place])    POLYCYSTIC OVARIAN DISEASE 03/02/2007   Qualifier: Diagnosis of  By: Linna Darner MD, Rae Mar BSO for cysts & endometriosis; Dr Reino Kent, Gyn Seeing Dr Paula Compton     PONV  (postoperative nausea and vomiting)    Pre-diabetes    Prediabetes    Sweating profusely    Symptomatic mammary hypertrophy    URI (upper respiratory infection)    currently on cefdinir    Past Surgical History:  Procedure Laterality Date   [place]   exploratory lap   BACK SURGERY     BREAST REDUCTION SURGERY Bilateral 03/26/2015   Procedure: BILATERAL BREAST REDUCTION ;  Surgeon: Wallace Going, DO;  Location: [facility];  Service: Plastics;  Laterality: Bilateral;   CARPAL TUNNEL RELEASE     bilateral; Dr Sherwood Gambler   COLONOSCOPY     in [place]  07-09-2004   HX BILATERAL RENAL STONES/ RIGHT FLANK PAIN   CYSTOSCOPY W/ URETERAL STENT PLACEMENT  11/12/2011   Procedure: CYSTOSCOPY WITH RETROGRADE PYELOGRAM/URETERAL STENT PLACEMENT;  Surgeon: Alexis Frock, MD;  Location: [facility];  Service: Urology;  Laterality: Right;   CYSTOSCOPY W/ URETERAL STENT REMOVAL  12/07/2011   Procedure: CYSTOSCOPY WITH STENT REMOVAL;  Surgeon: Alexis Frock, MD;  Location: [facility];  Service: Urology;  Laterality: Right;   CYSTOSCOPY/RETROGRADE/URETEROSCOPY/STONE EXTRACTION WITH BASKET  12/07/2011   Procedure: CYSTOSCOPY/RETROGRADE/URETEROSCOPY/STONE EXTRACTION WITH BASKET;  Surgeon: Alexis Frock, MD;  Location: [facility];  Service: Urology;  Laterality: Right;   DILATION AND CURETTAGE OF UTERUS     X5   EXCISION LEFT BARTHOLIN GLAND  02-05-2002   EYE SURGERY     Retinal tears surgery bilateral   LAPAROSCOPIC ASSISTED VAGINAL HYSTERECTOMY  2000   LAPAROSCOPIC CHOLECYSTECTOMY  05-31-2007   LAPAROSCOPIC LASER ABLATION ENDOMETRIOSIS AND LEFT SALPINGO-OOPHORECTOMY  11-03-1999   LAPAROSCOPY WITH RIGHT SALPINGO-OOPHECTOMY/ LYSIS ADHESIONS AND ABLATION ENDOMETRIOSIS  05-17-2001   LIPOSUCTION Bilateral 03/26/2015   Procedure: WITH LIPOSUCTION;  Surgeon:  Wallace Going, DO;  Location: [facility];  Service: Plastics;  Laterality: Bilateral;   LUMBAR LAMINECTOMY  2003   L4 - L5   NECK SURGERY  2017   PLANTAR FASCIA SURGERY     right   PLANTAR FASCIA SURGERY  02/2011   left   REDUCTION MAMMAPLASTY     RIGHT URETEROSCOPIC STONE EXTRACTION  08-04-2000   SHOULDER ARTHROSCOPY WITH ROTATOR CUFF REPAIR Right 05/11/2020   Procedure: SHOULDER ARTHROSCOPY WITH ROTATOR CUFF REPAIR;  Surgeon: Lovell Sheehan, MD;  Location: [facility];  Service: Orthopedics;  Laterality: Right;   TONSILLECTOMY     TOTAL HIP ARTHROPLASTY Left 01/11/2022   Procedure: TOTAL HIP ARTHROPLASTY ANTERIOR APPROACH;  Surgeon: Lovell Sheehan, MD;  Location: [facility];  Service: Orthopedics;  Laterality: Left;   URETERAL STENT PLACEMENT  11/12/11   Dr Tresa Moore    Current Outpatient Medications  Medication Sig Dispense Refill   acetaminophen (TYLENOL) 650 MG CR tablet Take 1,300 mg by mouth every 8 (eight) hours as needed for pain.     albuterol (VENTOLIN HFA) 108 (90 Base) MCG/ACT inhaler Inhale 2 puffs into the lungs every 6 (six) hours as needed for wheezing or shortness of breath. TAKE 2 PUFFS BY MOUTH EVERY 6 HOURS AS NEEDED FOR WHEEZE OR SHORTNESS OF BREATH 8.5 g 5   aspirin 81 MG chewable tablet Chew 1 tablet (81 mg total) by mouth 2 (two) times daily. 60 tablet 0   Blood Glucose Monitoring Suppl (ONE TOUCH ULTRA 2) w/Device KIT OneTouch Ultra2 Meter     celecoxib (CELEBREX) 200 MG capsule Take 200 mg by mouth daily.     citalopram (CELEXA) 20 MG tablet TAKE 1 TABLET BY MOUTH EVERY DAY 90 tablet 2   cyclobenzaprine (FLEXERIL) 10 MG tablet Take 10 mg by mouth 2 (two) times daily as needed.     diphenhydramine-acetaminophen (TYLENOL PM) 25-500 MG TABS tablet Take 2 tablets by mouth at bedtime.     EPINEPHrine 0.3 mg/0.3 mL IJ SOAJ injection See admin instructions.     famotidine (PEPCID) 20 MG tablet TAKE 1 TABLET BY MOUTH EVERYDAY AT BEDTIME 90 tablet 1    hydrochlorothiazide (MICROZIDE) 12.5 MG capsule TAKE 1 CAPSULE BY MOUTH EVERY DAY 90 capsule 1   hydroxychloroquine (PLAQUENIL) 200 MG tablet Take 200 mg by mouth 2 (two) times daily.     levocetirizine (XYZAL) 5 MG tablet TAKE 1 TABLET BY MOUTH EVERY EVENING. ANNUAL APPT DUE IN JULY MUST SEE PROVIDER FOR FUTURE REFILLS 90 tablet 0   linaclotide (LINZESS) 145 MCG CAPS capsule Take 1 capsule (145 mcg total) by mouth daily before breakfast. 90 capsule 1   lisinopril (ZESTRIL) 40 MG tablet Take 0.5 tablets (20 mg total) by mouth daily. 90 tablet 1   Melatonin 5  MG CHEW Chew 1 tablet by mouth as needed.     metFORMIN (GLUCOPHAGE) 500 MG tablet Take 1 tablet (500 mg total) by mouth 2 (two) times daily with a meal. 180 tablet 0   OneTouch Delica Lancets 99991111 MISC 1 each by Does not apply route 2 (two) times daily. 100 each 0   OVER THE COUNTER MEDICATION 1 tablet as needed. CBD gummy     Semaglutide, 2 MG/DOSE, 8 MG/3ML SOPN Inject 2 mg as directed once a week. 9 mL 0   SKYRIZI PEN 150 MG/ML SOAJ Inject into the skin.     topiramate (TOPAMAX) 50 MG tablet Take 1 tablet (50 mg total) by mouth daily. 90 tablet 0   No current facility-administered medications for this visit.    Allergies  Allergen Reactions   Chlorhexidine Gluconate Rash, Itching and Swelling    Developed severe rash where chloraprep was used on chest area   Ivp Dye [Iodinated Contrast Media] Rash and Other (See Comments)    Flushing, minor facial rash & dyspnea   Nalbuphine Shortness Of Breath and Rash    Nubain caused respiratory distress & rash   Septra [Sulfamethoxazole-Trimethoprim] Shortness Of Breath and Rash   Adhesive [Tape] Rash   Cymbalta [Duloxetine Hcl]     Had drastic mood changes   Diclofenac Other (See Comments)    GI Upset   Humira [Adalimumab]     "Did not like the way I felt"   Methotrexate Derivatives     GI upset     Augmentin [Amoxicillin-Pot Clavulanate] Nausea And Vomiting   Betadine [Povidone  Iodine] Rash   Latex Rash   Otezla [Apremilast] Rash    Social History   Socioeconomic History   Marital status: Married    Spouse name: Darnelle Maffucci   Number of children: 0   Years of education: college   Highest education level: Not on file  Occupational History   Occupation: disabled  Tobacco Use   Smoking status: Never   Smokeless tobacco: Never  Vaping Use   Vaping Use: Never used  Substance and Sexual Activity   Alcohol use: Yes    Alcohol/week: 1.0 standard drink of alcohol    Types: 1 Shots of liquor per week    Comment:  very rarely   Drug use: No   Sexual activity: Yes    Birth control/protection: Surgical  Other Topics Concern   Not on file  Social History Narrative   Patient Lives at home with her husband Darnelle Maffucci)   Disabled.   Education two years of college.   Right handed.   Caffeine coffee and sweet tea. Not daily.   Social Determinants of Health   Financial Resource Strain: Low Risk  (02/02/2022)   Overall Financial Resource Strain (CARDIA)    Difficulty of Paying Living Expenses: Not hard at all  Food Insecurity: No Food Insecurity (02/02/2022)   Hunger Vital Sign    Worried About Running Out of Food in the Last Year: Never true    Ran Out of Food in the Last Year: Never true  Transportation Needs: No Transportation Needs (02/02/2022)   PRAPARE - Hydrologist (Medical): No    Lack of Transportation (Non-Medical): No  Physical Activity: Insufficiently Active (02/02/2022)   Exercise Vital Sign    Days of Exercise per Week: 3 days    Minutes of Exercise per Session: 30 min  Stress: No Stress Concern Present (02/02/2022)   [place] -  Occupational Stress Questionnaire    Feeling of Stress : Only a little  Social Connections: Socially Integrated (02/02/2022)   Social Connection and Isolation Panel [NHANES]    Frequency of Communication with Friends and Family: More than three times a week     Frequency of Social Gatherings with Friends and Family: More than three times a week    Attends Religious Services: More than 4 times per year    Active Member of [employer] or Organizations: Yes    Attends Archivist Meetings: More than 4 times per year    Marital Status: Married  Human resources officer Violence: Not At Risk (02/02/2022)   Humiliation, Afraid, Rape, and Kick questionnaire    Fear of Current or Ex-Partner: No    Emotionally Abused: No    Physically Abused: No    Sexually Abused: No    Family History  Problem Relation Age of Onset   Hypertension Mother    Colon polyps Mother    Atrial fibrillation Mother        Dr Angelena Form   Hyperlipidemia Mother    Heart disease Mother    Anxiety disorder Mother    Sleep apnea Mother    Obesity Mother    Heart attack Father 5   Diabetes Father    Hypertension Father    Hyperlipidemia Father    Heart disease Father    Stroke Father    Obesity Father    Hypertension Sister    Heart attack Paternal Grandmother 76   Breast cancer Paternal Aunt 79   Colon cancer Maternal Uncle    Colon cancer Paternal Uncle    Colon polyps Maternal Grandmother    Stroke Maternal Grandmother 76   Diabetes Paternal Grandfather    Stroke Maternal Grandfather 37    Review of Systems:  As stated in the HPI and otherwise negative.   BP 118/70   Pulse 89   Ht 5' 8"$  (1.727 m)   Wt 103 kg   SpO2 99%   BMI 34.52 kg/m   Physical Examination: General: Well developed, well nourished, NAD  HEENT: OP clear, mucus membranes moist  SKIN: warm, dry. No rashes. Neuro: No focal deficits  Musculoskeletal: Muscle strength 5/5 all ext  Psychiatric: Mood and affect normal  Neck: No JVD, no carotid bruits, no thyromegaly, no lymphadenopathy.  Lungs:Clear bilaterally, no wheezes, rhonci, crackles Cardiovascular: Regular rate and rhythm. No murmurs, gallops or rubs. Abdomen:Soft. Bowel sounds present. Non-tender.  Extremities: No lower extremity edema.  Pulses are 2 + in the bilateral DP/PT.  EKG:  EKG is not ordered today. The ekg ordered today demonstrates   Recent Labs: 12/09/2021: Hemoglobin 12.1; Platelets 276; TSH 2.270 04/21/2022: ALT 18; BUN 25; Creatinine, Ser 0.97; Potassium 4.0; Sodium 138   Lipid Panel    Component Value Date/Time   CHOL 144 12/09/2021 1442   TRIG 78 12/09/2021 1442   HDL 55 12/09/2021 1442   CHOLHDL 3 08/16/2016 1011   VLDL 12.2 08/16/2016 1011   LDLCALC 74 12/09/2021 1442     Wt Readings from Last 3 Encounters:  05/13/22 103 kg  04/21/22 100.7 kg  03/10/22 100.7 kg    Assessment and Plan:   1. Dyspnea on exertion: Coronary CTA in 2023 with no evidence of CAD. Echo in 2023 with normal LV systolic function and no valve disease. Rare dyspnea now. She feels well overall.   Labs/ tests ordered today include:  No orders of the defined types were placed in this encounter.  Disposition:   F/U with me in one year.   Signed, Lauree Chandler, MD 05/13/2022 10:18 AM    [facility] [address], [place], [place]  [address] Phone: [phone]; Fax: [phone]

## 2022-05-13 ENCOUNTER — Ambulatory Visit: Payer: PPO | Attending: Cardiovascular Disease | Admitting: Cardiovascular Disease

## 2022-05-13 ENCOUNTER — Encounter: Payer: Self-pay | Admitting: Cardiovascular Disease

## 2022-05-13 VITALS — BP 118/70 | HR 89 | Ht 68.0 in | Wt 227.0 lb

## 2022-05-13 DIAGNOSIS — R0609 Other forms of dyspnea: Secondary | ICD-10-CM

## 2022-05-13 NOTE — Patient Instructions (Signed)
Medication Instructions:  No changes *If you need a refill on your cardiac medications before your next appointment, please call your pharmacy*   Lab Work: none If you have labs (blood work) drawn today and your tests are completely normal, you will receive your results only by: [place] (if you have MyChart) OR A paper copy in the mail If you have any lab test that is abnormal or we need to change your treatment, we will call you to review the results.   Testing/Procedures: none   Follow-Up: At [facility], you and your health needs are our priority.  As part of our continuing mission to provide you with exceptional heart care, we have created designated Provider Care Teams.  These Care Teams include your primary Cardiologist (physician) and Advanced Practice Providers (APPs -  Physician Assistants and Nurse Practitioners) who all work together to provide you with the care you need, when you need it.   Your next appointment:   12 month(s)  Provider:   Lauree Chandler, MD

## 2022-05-14 ENCOUNTER — Other Ambulatory Visit: Payer: Self-pay | Admitting: Internal Medicine

## 2022-05-19 ENCOUNTER — Encounter (INDEPENDENT_AMBULATORY_CARE_PROVIDER_SITE_OTHER): Payer: Self-pay | Admitting: Family Medicine

## 2022-05-19 ENCOUNTER — Ambulatory Visit (INDEPENDENT_AMBULATORY_CARE_PROVIDER_SITE_OTHER): Payer: PPO | Admitting: Family Medicine

## 2022-05-19 VITALS — BP 112/66 | HR 77 | Temp 98.8°F | Ht 68.0 in | Wt 223.0 lb

## 2022-05-19 DIAGNOSIS — E1169 Type 2 diabetes mellitus with other specified complication: Secondary | ICD-10-CM | POA: Diagnosis not present

## 2022-05-19 DIAGNOSIS — Z6834 Body mass index (BMI) 34.0-34.9, adult: Secondary | ICD-10-CM | POA: Diagnosis not present

## 2022-05-19 DIAGNOSIS — Z7985 Long-term (current) use of injectable non-insulin antidiabetic drugs: Secondary | ICD-10-CM | POA: Diagnosis not present

## 2022-05-19 DIAGNOSIS — E559 Vitamin D deficiency, unspecified: Secondary | ICD-10-CM | POA: Diagnosis not present

## 2022-05-19 DIAGNOSIS — E669 Obesity, unspecified: Secondary | ICD-10-CM

## 2022-05-19 DIAGNOSIS — Z6832 Body mass index (BMI) 32.0-32.9, adult: Secondary | ICD-10-CM | POA: Insufficient documentation

## 2022-05-19 DIAGNOSIS — Z6831 Body mass index (BMI) 31.0-31.9, adult: Secondary | ICD-10-CM | POA: Insufficient documentation

## 2022-05-19 MED ORDER — ZEPBOUND 15 MG/0.5ML ~~LOC~~ SOAJ: 15.0000 mg | SUBCUTANEOUS | 0 refills | Status: DC

## 2022-05-19 MED ORDER — VITAMIN D (ERGOCALCIFEROL) 1.25 MG (50000 UNIT) PO CAPS: 50000.0000 [IU] | ORAL_CAPSULE | ORAL | 0 refills | Status: DC

## 2022-06-01 ENCOUNTER — Encounter (INDEPENDENT_AMBULATORY_CARE_PROVIDER_SITE_OTHER): Payer: Self-pay | Admitting: Family Medicine

## 2022-06-01 DIAGNOSIS — E88819 Insulin resistance, unspecified: Secondary | ICD-10-CM

## 2022-06-01 DIAGNOSIS — R7303 Prediabetes: Secondary | ICD-10-CM

## 2022-06-02 ENCOUNTER — Telehealth (INDEPENDENT_AMBULATORY_CARE_PROVIDER_SITE_OTHER): Payer: Self-pay | Admitting: Family Medicine

## 2022-06-02 MED ORDER — TIRZEPATIDE 15 MG/0.5ML ~~LOC~~ SOAJ: 15.0000 mg | SUBCUTANEOUS | 0 refills | Status: DC

## 2022-06-02 NOTE — Telephone Encounter (Signed)
Dr. Leafy Ro,  The PA for Zepbound was denied, what are the options for her. Please advise.

## 2022-06-02 NOTE — Telephone Encounter (Signed)
Ok to send in mounjaro 15 mg qweek x 1 month

## 2022-06-02 NOTE — Telephone Encounter (Signed)
PA SUBMITTED FOR ZEPBOUND VIA [employer]  Janne STUMP [place] (Key: [id number])  Your information has been sent to [employer].

## 2022-06-06 ENCOUNTER — Encounter: Payer: Self-pay | Admitting: Internal Medicine

## 2022-06-06 MED ORDER — CITALOPRAM HYDROBROMIDE 20 MG PO TABS: 20.0000 mg | ORAL_TABLET | Freq: Every day | ORAL | 1 refills | Status: DC

## 2022-06-06 NOTE — Progress Notes (Signed)
Chief Complaint:   OBESITY [name] is here to discuss her progress with her obesity treatment plan along with follow-up of her obesity related diagnoses. [name] is on keeping a food journal and adhering to recommended goals of 1200 calories and 75 grams of protein and states she is following her eating plan approximately 80% of the time. [name] states she is doing 0 minutes 0 times per week.  Today's visit was #: 37 Starting weight: 241 lbs Starting date: 09/28/2016 Today's weight: 223 lbs Today's date: 05/19/2022 Total lbs lost to date: 18 Total lbs lost since last in-office visit: 0  Interim History: [name] is constipated and her water weight has increased.  She is working on journaling on and off, but still eating out a few times a week.  She is struggling to drink sweet tea daily.  She describes her diet and the carbohydrates difficult.  Subjective:   1. Vitamin D deficiency [name]'s vitamin D level has decreased below goal, and she notes fatigue.  I discussed labs with the patient today.  2. Type 2 diabetes mellitus with other specified complication, unspecified whether long term insulin use ([place]) [name] is on Ozempic, but she continues to struggle with polyphagia.  She is open to looking at other medication options.  I discussed labs with the patient today.  Assessment/Plan:   1. Vitamin D deficiency [name] agreed to restart prescription vitamin D 50,000 units once weekly with no refills.  - Vitamin D, Ergocalciferol, (DRISDOL) 1.25 MG (50000 UNIT) CAPS capsule; Take 1 capsule (50,000 Units total) by mouth every 7 (seven) days.  Dispense: 4 capsule; Refill: 0  2. Type 2 diabetes mellitus with other specified complication, unspecified whether long term insulin use ([place]) [name] agreed to discontinue Ozempic, and start Zepbound 15 mg once weekly with no refills.   3. BMI 34.0-34.9,adult  4. Obesity, Beginning BMI 36.64 [name] is currently in the action  stage of change. As such, her goal is to continue with weight loss efforts. She has agreed to keeping a food journal and adhering to recommended goals of 1200 calories and 75+ grams of protein daily.   Behavioral modification strategies: increasing lean protein intake.  [name] has agreed to follow-up with our clinic in 4 to 6 weeks. She was informed of the importance of frequent follow-up visits to maximize her success with intensive lifestyle modifications for her multiple health conditions.   Objective:   Blood pressure 112/66, pulse 77, temperature 98.8 F (37.1 C), height '5\' 8"'$  (1.727 m), weight 223 lb (101.2 kg), SpO2 98 %. Body mass index is 33.91 kg/m.   Lab Results  Component Value Date   CREATININE 0.97 04/21/2022   BUN 25 (H) 04/21/2022   NA 138 04/21/2022   K 4.0 04/21/2022   CL 101 04/21/2022   CO2 21 04/21/2022   Lab Results  Component Value Date   ALT 18 04/21/2022   AST 21 04/21/2022   ALKPHOS 108 04/21/2022   BILITOT 0.3 04/21/2022   Lab Results  Component Value Date   HGBA1C 5.4 04/21/2022   HGBA1C 5.5 12/09/2021   HGBA1C 5.5 04/27/2021   HGBA1C 5.7 (H) 12/31/2020   HGBA1C 5.8 (H) 08/13/2020   Lab Results  Component Value Date   INSULIN 13.8 04/21/2022   INSULIN 13.0 12/09/2021   INSULIN 28.5 (H) 04/27/2021   INSULIN 17.9 12/31/2020   INSULIN 14.2 08/13/2020   Lab Results  Component Value Date   TSH 2.270 12/09/2021   Lab Results  Component Value Date   CHOL 144 12/09/2021   HDL 55 12/09/2021   LDLCALC 74 12/09/2021   TRIG 78 12/09/2021   CHOLHDL 3 08/16/2016   Lab Results  Component Value Date   VD25OH 28.4 (L) 04/21/2022   VD25OH 46.6 12/09/2021   VD25OH 35.1 04/27/2021   Lab Results  Component Value Date   WBC 6.6 12/09/2021   HGB 12.1 12/09/2021   HCT 37.1 12/09/2021   MCV 87 12/09/2021   PLT 276 12/09/2021   No results found for: "IRON", "TIBC", "FERRITIN"  Attestation Statements:   Reviewed by clinician on day of  visit: allergies, medications, problem list, medical history, surgical history, family history, social history, and previous encounter notes.   I, Trixie Dredge, am acting as transcriptionist for Dennard Nip, MD.  I have reviewed the above documentation for accuracy and completeness, and I agree with the above. -  Dennard Nip, MD

## 2022-06-09 NOTE — Telephone Encounter (Signed)
PA for Zepbound has been denied. The following reason was given:  We have denied your request as the diagnosis submitted is considered to be part of an excluded (not covered) category, medications utilized to promote weight loss, by the Centers for Medicare and Medicaid

## 2022-06-13 ENCOUNTER — Telehealth (INDEPENDENT_AMBULATORY_CARE_PROVIDER_SITE_OTHER): Payer: Self-pay

## 2022-06-13 ENCOUNTER — Other Ambulatory Visit (INDEPENDENT_AMBULATORY_CARE_PROVIDER_SITE_OTHER): Payer: Self-pay | Admitting: Family Medicine

## 2022-06-13 DIAGNOSIS — E559 Vitamin D deficiency, unspecified: Secondary | ICD-10-CM

## 2022-06-13 NOTE — Telephone Encounter (Signed)
PA started for [facility] 06/13/22

## 2022-06-16 DIAGNOSIS — R3915 Urgency of urination: Secondary | ICD-10-CM | POA: Diagnosis not present

## 2022-06-16 DIAGNOSIS — N200 Calculus of kidney: Secondary | ICD-10-CM | POA: Diagnosis not present

## 2022-06-18 ENCOUNTER — Other Ambulatory Visit: Payer: Self-pay | Admitting: Internal Medicine

## 2022-06-18 DIAGNOSIS — J309 Allergic rhinitis, unspecified: Secondary | ICD-10-CM

## 2022-06-23 ENCOUNTER — Ambulatory Visit: Payer: PPO | Admitting: Internal Medicine

## 2022-06-23 ENCOUNTER — Encounter (INDEPENDENT_AMBULATORY_CARE_PROVIDER_SITE_OTHER): Payer: Self-pay | Admitting: Family Medicine

## 2022-06-23 ENCOUNTER — Ambulatory Visit (INDEPENDENT_AMBULATORY_CARE_PROVIDER_SITE_OTHER): Payer: PPO | Admitting: Family Medicine

## 2022-06-23 VITALS — BP 90/58 | HR 98 | Temp 98.7°F | Ht 68.0 in | Wt 215.0 lb

## 2022-06-23 DIAGNOSIS — E559 Vitamin D deficiency, unspecified: Secondary | ICD-10-CM

## 2022-06-23 DIAGNOSIS — Z7985 Long-term (current) use of injectable non-insulin antidiabetic drugs: Secondary | ICD-10-CM | POA: Diagnosis not present

## 2022-06-23 DIAGNOSIS — E1169 Type 2 diabetes mellitus with other specified complication: Secondary | ICD-10-CM | POA: Diagnosis not present

## 2022-06-23 DIAGNOSIS — Z6832 Body mass index (BMI) 32.0-32.9, adult: Secondary | ICD-10-CM | POA: Diagnosis not present

## 2022-06-23 DIAGNOSIS — E669 Obesity, unspecified: Secondary | ICD-10-CM | POA: Diagnosis not present

## 2022-06-23 MED ORDER — VITAMIN D (ERGOCALCIFEROL) 1.25 MG (50000 UNIT) PO CAPS: 50000.0000 [IU] | ORAL_CAPSULE | ORAL | 0 refills | Status: DC

## 2022-06-23 MED ORDER — TIRZEPATIDE 15 MG/0.5ML ~~LOC~~ SOAJ: 15.0000 mg | SUBCUTANEOUS | 0 refills | Status: DC

## 2022-06-27 ENCOUNTER — Encounter: Payer: Self-pay | Admitting: Internal Medicine

## 2022-06-27 DIAGNOSIS — J309 Allergic rhinitis, unspecified: Secondary | ICD-10-CM | POA: Insufficient documentation

## 2022-06-27 NOTE — Progress Notes (Unsigned)
Subjective:    Patient ID: [name], female    DOB: [birth year], 52 y.o.   MRN: [id number]     HPI [name] is here for follow up of her chronic medical problems.  She is not sure if she has allergies or sinus infection, but for the past week she has had sinus symptoms including nasal congestion with some discoloration in the mucus, sore throat which has resolved, postnasal drainage, cough, sinus pressure, headaches.  She has had some lightheadedness.  Medications and allergies reviewed with patient and updated if appropriate.  Current Outpatient Medications on File Prior to Visit  Medication Sig Dispense Refill   acetaminophen (TYLENOL) 650 MG CR tablet Take 1,300 mg by mouth every 8 (eight) hours as needed for pain.     albuterol (VENTOLIN HFA) 108 (90 Base) MCG/ACT inhaler Inhale 2 puffs into the lungs every 6 (six) hours as needed for wheezing or shortness of breath. TAKE 2 PUFFS BY MOUTH EVERY 6 HOURS AS NEEDED FOR WHEEZE OR SHORTNESS OF BREATH 8.5 g 5   aspirin 81 MG chewable tablet Chew 1 tablet (81 mg total) by mouth 2 (two) times daily. 60 tablet 0   Blood Glucose Monitoring Suppl (ONE TOUCH ULTRA 2) w/Device KIT OneTouch Ultra2 Meter     celecoxib (CELEBREX) 200 MG capsule Take 200 mg by mouth daily.     citalopram (CELEXA) 20 MG tablet Take 1 tablet (20 mg total) by mouth daily. 90 tablet 1   cyclobenzaprine (FLEXERIL) 10 MG tablet Take 10 mg by mouth 2 (two) times daily as needed.     diphenhydramine-acetaminophen (TYLENOL PM) 25-500 MG TABS tablet Take 2 tablets by mouth at bedtime.     EPINEPHrine 0.3 mg/0.3 mL IJ SOAJ injection See admin instructions.     famotidine (PEPCID) 20 MG tablet TAKE 1 TABLET BY MOUTH EVERYDAY AT BEDTIME 90 tablet 1   fexofenadine (ALLEGRA) 180 MG tablet Take 180 mg by mouth daily.     hydrochlorothiazide (MICROZIDE) 12.5 MG capsule TAKE 1 CAPSULE BY MOUTH EVERY DAY 90 capsule 1   hydroxychloroquine (PLAQUENIL) 200 MG tablet Take  200 mg by mouth 2 (two) times daily.     linaclotide (LINZESS) 145 MCG CAPS capsule Take 1 capsule (145 mcg total) by mouth daily before breakfast. 90 capsule 1   lisinopril (ZESTRIL) 40 MG tablet TAKE 1 TABLET BY MOUTH EVERY DAY 90 tablet 1   Melatonin 5 MG CHEW Chew 1 tablet by mouth as needed.     metFORMIN (GLUCOPHAGE) 500 MG tablet Take 1 tablet (500 mg total) by mouth 2 (two) times daily with a meal. 180 tablet 0   OneTouch Delica Lancets 99991111 MISC 1 each by Does not apply route 2 (two) times daily. 100 each 0   OVER THE COUNTER MEDICATION 1 tablet as needed. CBD gummy     SKYRIZI PEN 150 MG/ML SOAJ Inject into the skin.     tirzepatide (MOUNJARO) 15 MG/0.5ML Pen Inject 15 mg into the skin once a week. 2 mL 0   topiramate (TOPAMAX) 50 MG tablet Take 1 tablet (50 mg total) by mouth daily. 90 tablet 0   Vitamin D, Ergocalciferol, (DRISDOL) 1.25 MG (50000 UNIT) CAPS capsule Take 1 capsule (50,000 Units total) by mouth every 7 (seven) days. 4 capsule 0   No current facility-administered medications on file prior to visit.     Review of Systems  Constitutional:  Negative for fever.  HENT:  Positive for congestion (  discolored mucus), postnasal drip and sinus pressure. Negative for ear pain and sore throat (last week).   Respiratory:  Positive for cough (at night). Negative for shortness of breath and wheezing.   Cardiovascular:  Negative for chest pain, palpitations and leg swelling.  Gastrointestinal:  Positive for constipation (controlled).  Neurological:  Positive for dizziness, light-headedness and headaches (sinus related).       Objective:   Vitals:   06/28/22 1013  BP: 108/80  Pulse: 92  Temp: 98.2 F (36.8 C)  SpO2: 98%   BP Readings from Last 3 Encounters:  06/28/22 108/80  06/23/22 (!) 90/58  05/19/22 112/66   Wt Readings from Last 3 Encounters:  06/28/22 212 lb (96.2 kg)  06/23/22 215 lb (97.5 kg)  05/19/22 223 lb (101.2 kg)   Body mass index is 32.23  kg/m.    Physical Exam Constitutional:      General: She is not in acute distress.    Appearance: Normal appearance.  HENT:     Head: Normocephalic and atraumatic.  Eyes:     Conjunctiva/sclera: Conjunctivae normal.  Cardiovascular:     Rate and Rhythm: Normal rate and regular rhythm.     Heart sounds: Normal heart sounds.  Pulmonary:     Effort: Pulmonary effort is normal. No respiratory distress.     Breath sounds: Normal breath sounds. No wheezing.  Musculoskeletal:     Cervical back: Neck supple.     Right lower leg: No edema.     Left lower leg: No edema.  Lymphadenopathy:     Cervical: No cervical adenopathy.  Skin:    General: Skin is warm and dry.     Findings: No rash.  Neurological:     Mental Status: She is alert. Mental status is at baseline.  Psychiatric:        Mood and Affect: Mood normal.        Behavior: Behavior normal.        Lab Results  Component Value Date   WBC 6.6 12/09/2021   HGB 12.1 12/09/2021   HCT 37.1 12/09/2021   PLT 276 12/09/2021   GLUCOSE 77 04/21/2022   CHOL 144 12/09/2021   TRIG 78 12/09/2021   HDL 55 12/09/2021   LDLCALC 74 12/09/2021   ALT 18 04/21/2022   AST 21 04/21/2022   NA 138 04/21/2022   K 4.0 04/21/2022   CL 101 04/21/2022   CREATININE 0.97 04/21/2022   BUN 25 (H) 04/21/2022   CO2 21 04/21/2022   TSH 2.270 12/09/2021   HGBA1C 5.4 04/21/2022     Assessment & Plan:    See Problem List for Assessment and Plan of chronic medical problems.

## 2022-06-27 NOTE — Patient Instructions (Addendum)
      Blood work was ordered.   The lab is on the first floor.    Medications changes include :       A referral was ordered for XXX.     Someone will call you to schedule an appointment.    Return in about 6 months (around 12/29/2022) for Physical Exam.

## 2022-06-28 ENCOUNTER — Ambulatory Visit (INDEPENDENT_AMBULATORY_CARE_PROVIDER_SITE_OTHER): Payer: PPO | Admitting: Internal Medicine

## 2022-06-28 VITALS — BP 108/80 | HR 92 | Temp 98.2°F | Ht 68.0 in | Wt 212.0 lb

## 2022-06-28 DIAGNOSIS — F3289 Other specified depressive episodes: Secondary | ICD-10-CM | POA: Diagnosis not present

## 2022-06-28 DIAGNOSIS — J4520 Mild intermittent asthma, uncomplicated: Secondary | ICD-10-CM

## 2022-06-28 DIAGNOSIS — Z0000 Encounter for general adult medical examination without abnormal findings: Secondary | ICD-10-CM

## 2022-06-28 DIAGNOSIS — I10 Essential (primary) hypertension: Secondary | ICD-10-CM | POA: Diagnosis not present

## 2022-06-28 DIAGNOSIS — J309 Allergic rhinitis, unspecified: Secondary | ICD-10-CM

## 2022-06-28 DIAGNOSIS — F419 Anxiety disorder, unspecified: Secondary | ICD-10-CM | POA: Diagnosis not present

## 2022-06-28 DIAGNOSIS — E1169 Type 2 diabetes mellitus with other specified complication: Secondary | ICD-10-CM

## 2022-06-28 DIAGNOSIS — R7303 Prediabetes: Secondary | ICD-10-CM | POA: Diagnosis not present

## 2022-06-28 DIAGNOSIS — K219 Gastro-esophageal reflux disease without esophagitis: Secondary | ICD-10-CM | POA: Diagnosis not present

## 2022-06-28 DIAGNOSIS — J0100 Acute maxillary sinusitis, unspecified: Secondary | ICD-10-CM

## 2022-06-28 DIAGNOSIS — L4050 Arthropathic psoriasis, unspecified: Secondary | ICD-10-CM

## 2022-06-28 MED ORDER — LISINOPRIL 20 MG PO TABS: 20.0000 mg | ORAL_TABLET | Freq: Every day | ORAL | 3 refills | Status: DC

## 2022-06-28 MED ORDER — CEFDINIR 300 MG PO CAPS: 300.0000 mg | ORAL_CAPSULE | Freq: Two times a day (BID) | ORAL | 0 refills | Status: AC

## 2022-06-28 MED ORDER — LINACLOTIDE 145 MCG PO CAPS: 145.0000 ug | ORAL_CAPSULE | Freq: Every day | ORAL | 1 refills | Status: DC

## 2022-06-28 MED ORDER — FAMOTIDINE 20 MG PO TABS: ORAL_TABLET | ORAL | 1 refills | Status: DC

## 2022-06-28 NOTE — Assessment & Plan Note (Addendum)
Chronic Blood pressure well controlled-May be too controlled She is experiencing some lightheadedness at times CMP Discontinue hydrochlorothiazide Continue lisinopril 20 mg daily

## 2022-06-28 NOTE — Progress Notes (Signed)
Chief Complaint:   OBESITY [name] is here to discuss her progress with her obesity treatment plan along with follow-up of her obesity related diagnoses. [name] is on keeping a food journal and adhering to recommended goals of 1200 calories and 75+ grams of protein and states she is following her eating plan approximately 50% of the time. [name] states she is doing 0 minutes 0 times per week.  Today's visit was #: 29 Starting weight: 241 lbs Starting date: 09/28/2016 Today's weight: 215 lbs Today's date: 06/23/2022 Total lbs lost to date: 26 Total lbs lost since last in-office visit: 8  Interim History: [name] has done well with weight loss in the last month.  Her appetite has decreased, but she is still trying to eat healthier.  Subjective:   1. Type 2 diabetes mellitus with other specified complication, unspecified whether long term insulin use ([name]) Chene changed to [name] and she notes decreased polyphagia.  Her glucose is not dropping too low and is controlled overall.  She notes mild nausea but she thinks that is due to her allergies and postnasal drip.  2. Vitamin D deficiency Vision is stable on vitamin D prescription, and she has no signs of over replacement.  Assessment/Plan:   1. Type 2 diabetes mellitus with other specified complication, unspecified whether long term insulin use ([name]) [name] will continue Mounjaro 15 mg once weekly, and we will refill for 1 month.  - tirzepatide (MOUNJARO) 15 MG/0.5ML Pen; Inject 15 mg into the skin once a week.  Dispense: 2 mL; Refill: 0  2. Vitamin D deficiency [name] will continue prescription vitamin D, and we will refill for 1 month.  - Vitamin D, Ergocalciferol, (DRISDOL) 1.25 MG (50000 UNIT) CAPS capsule; Take 1 capsule (50,000 Units total) by mouth every 7 (seven) days.  Dispense: 4 capsule; Refill: 0  3. BMI 32.0-32.9,adult  4. Obesity, Beginning BMI 36.64 [name] is currently in the action stage of  change. As such, her goal is to continue with weight loss efforts. She has agreed to keeping a food journal and adhering to recommended goals of 1200 calories and 75+ grams of protein daily.   [name] is to make sure she increases her protein and not skip breakfast.  Behavioral modification strategies: increasing lean protein intake and no skipping meals.  [name] has agreed to follow-up with our clinic in 4 weeks. She was informed of the importance of frequent follow-up visits to maximize her success with intensive lifestyle modifications for her multiple health conditions.   Objective:   Blood pressure (!) 90/58, pulse 98, temperature 98.7 F (37.1 C), height 5\' 8"  (1.727 m), weight 215 lb (97.5 kg), SpO2 97 %. Body mass index is 32.69 kg/m.  Lab Results  Component Value Date   CREATININE 0.97 04/21/2022   BUN 25 (H) 04/21/2022   NA 138 04/21/2022   K 4.0 04/21/2022   CL 101 04/21/2022   CO2 21 04/21/2022   Lab Results  Component Value Date   ALT 18 04/21/2022   AST 21 04/21/2022   ALKPHOS 108 04/21/2022   BILITOT 0.3 04/21/2022   Lab Results  Component Value Date   HGBA1C 5.4 04/21/2022   HGBA1C 5.5 12/09/2021   HGBA1C 5.5 04/27/2021   HGBA1C 5.7 (H) 12/31/2020   HGBA1C 5.8 (H) 08/13/2020   Lab Results  Component Value Date   INSULIN 13.8 04/21/2022   INSULIN 13.0 12/09/2021   INSULIN 28.5 (H) 04/27/2021   INSULIN 17.9 12/31/2020   INSULIN 14.2  08/13/2020   Lab Results  Component Value Date   TSH 2.270 12/09/2021   Lab Results  Component Value Date   CHOL 144 12/09/2021   HDL 55 12/09/2021   LDLCALC 74 12/09/2021   TRIG 78 12/09/2021   CHOLHDL 3 08/16/2016   Lab Results  Component Value Date   VD25OH 28.4 (L) 04/21/2022   VD25OH 46.6 12/09/2021   VD25OH 35.1 04/27/2021   Lab Results  Component Value Date   WBC 6.6 12/09/2021   HGB 12.1 12/09/2021   HCT 37.1 12/09/2021   MCV 87 12/09/2021   PLT 276 12/09/2021   No results found for: "IRON",  "TIBC", "FERRITIN"  Attestation Statements:   Reviewed by clinician on day of visit: allergies, medications, problem list, medical history, surgical history, family history, social history, and previous encounter notes.   I, Trixie Dredge, am acting as transcriptionist for Dennard Nip, MD.  I have reviewed the above documentation for accuracy and completeness, and I agree with the above. -  Dennard Nip, MD

## 2022-06-28 NOTE — Assessment & Plan Note (Signed)
Chronic GERD controlled Continue Pepcid 20 mg nightly

## 2022-06-28 NOTE — Assessment & Plan Note (Signed)
Acute Concern for bacterial cause Start cefdinir 300 mg twice daily x 7 days otc cold medications, allergy medications, albuterol as needed Rest, fluid Call if no improvement

## 2022-06-28 NOTE — Assessment & Plan Note (Signed)
Chronic Following with [facility] On hydroxychloroquine 200 mg twice daily, Skyrizi, Celebrex

## 2022-06-28 NOTE — Assessment & Plan Note (Signed)
Chronic Controlled, Stable Continue Celexa 20 mg daily 

## 2022-06-28 NOTE — Assessment & Plan Note (Signed)
Chronic Check a1c On Mounjaro, metformin Low sugar / carb diet Stressed regular exercise

## 2022-06-28 NOTE — Assessment & Plan Note (Signed)
Chronic Controlled, Stable - denies depression Continue Celexa 20 mg daily 

## 2022-06-28 NOTE — Assessment & Plan Note (Addendum)
Chronic Mild, intermediate Controlled Albuterol inhaler as needed

## 2022-06-28 NOTE — Assessment & Plan Note (Addendum)
Chronic Continue Allegra

## 2022-07-07 ENCOUNTER — Telehealth (INDEPENDENT_AMBULATORY_CARE_PROVIDER_SITE_OTHER): Payer: Self-pay | Admitting: Family Medicine

## 2022-07-07 ENCOUNTER — Other Ambulatory Visit (INDEPENDENT_AMBULATORY_CARE_PROVIDER_SITE_OTHER): Payer: Self-pay | Admitting: Family Medicine

## 2022-07-07 MED ORDER — TIRZEPATIDE 10 MG/0.5ML ~~LOC~~ SOAJ: 10.0000 mg | SUBCUTANEOUS | 0 refills | Status: DC

## 2022-07-07 NOTE — Telephone Encounter (Signed)
Please advise I sent in the mounjaro 10 mg

## 2022-07-07 NOTE — Telephone Encounter (Signed)
Patient stated [facility]  only has the 10mg  of Monjouro instead of 15mg . She stated it is limited right now and would like for a request for the 10mg  to be sent to her pharmacy.

## 2022-07-07 NOTE — Telephone Encounter (Signed)
Please advise, last OV 06/23/22

## 2022-07-11 ENCOUNTER — Encounter (INDEPENDENT_AMBULATORY_CARE_PROVIDER_SITE_OTHER): Payer: Self-pay

## 2022-07-11 NOTE — Telephone Encounter (Signed)
Message sent to pt.

## 2022-07-21 ENCOUNTER — Encounter (INDEPENDENT_AMBULATORY_CARE_PROVIDER_SITE_OTHER): Payer: Self-pay | Admitting: Family Medicine

## 2022-07-21 ENCOUNTER — Ambulatory Visit (INDEPENDENT_AMBULATORY_CARE_PROVIDER_SITE_OTHER): Payer: PPO | Admitting: Family Medicine

## 2022-07-21 VITALS — BP 89/67 | HR 85 | Temp 98.0°F | Ht 68.0 in | Wt 213.0 lb

## 2022-07-21 DIAGNOSIS — E559 Vitamin D deficiency, unspecified: Secondary | ICD-10-CM

## 2022-07-21 DIAGNOSIS — Z7984 Long term (current) use of oral hypoglycemic drugs: Secondary | ICD-10-CM | POA: Diagnosis not present

## 2022-07-21 DIAGNOSIS — Z7985 Long-term (current) use of injectable non-insulin antidiabetic drugs: Secondary | ICD-10-CM

## 2022-07-21 DIAGNOSIS — Z6832 Body mass index (BMI) 32.0-32.9, adult: Secondary | ICD-10-CM | POA: Diagnosis not present

## 2022-07-21 DIAGNOSIS — E1169 Type 2 diabetes mellitus with other specified complication: Secondary | ICD-10-CM | POA: Diagnosis not present

## 2022-07-21 DIAGNOSIS — E669 Obesity, unspecified: Secondary | ICD-10-CM

## 2022-07-21 DIAGNOSIS — F3289 Other specified depressive episodes: Secondary | ICD-10-CM | POA: Diagnosis not present

## 2022-07-21 MED ORDER — TIRZEPATIDE 10 MG/0.5ML ~~LOC~~ SOAJ: 10.0000 mg | SUBCUTANEOUS | 0 refills | Status: DC

## 2022-07-21 MED ORDER — TOPIRAMATE 50 MG PO TABS: 50.0000 mg | ORAL_TABLET | Freq: Every day | ORAL | 0 refills | Status: DC

## 2022-07-21 MED ORDER — VITAMIN D (ERGOCALCIFEROL) 1.25 MG (50000 UNIT) PO CAPS
50000.0000 [IU] | ORAL_CAPSULE | ORAL | 0 refills | Status: DC
Start: 2022-07-21 — End: 2022-08-18

## 2022-07-21 MED ORDER — TIRZEPATIDE 15 MG/0.5ML ~~LOC~~ SOAJ: 15.0000 mg | SUBCUTANEOUS | 0 refills | Status: DC

## 2022-07-21 MED ORDER — METFORMIN HCL 500 MG PO TABS: 500.0000 mg | ORAL_TABLET | Freq: Two times a day (BID) | ORAL | 0 refills | Status: DC

## 2022-07-21 MED ORDER — BUPROPION HCL ER (SR) 150 MG PO TB12
150.0000 mg | ORAL_TABLET | Freq: Every day | ORAL | 0 refills | Status: DC
Start: 2022-07-21 — End: 2022-08-15

## 2022-07-26 NOTE — Progress Notes (Signed)
Chief Complaint:   OBESITY [name] is here to discuss her progress with her obesity treatment plan along with follow-up of her obesity related diagnoses. [name] is on keeping a food journal and adhering to recommended goals of 1200 calories and 75+ grams of protein and states she is following her eating plan approximately 0% of the time. [name] states she is doing 0 minutes 0 times per week.  Today's visit was #: 78 Starting weight: 241 lbs Starting date: 09/28/2016 Today's weight: 213 lbs Today's date: 07/21/2022 Total lbs lost to date: 28 Total lbs lost since last in-office visit: 2  Interim History: [name] continues to work on increasing her protein.  She has not been journaling while caring for her mother, which is status post hip replacement.  She is drinking 1-2 protein drinks per day to help keep her protein up.  Subjective:   1. Vitamin D deficiency [name] is on prescription vitamin D, and her last vitamin D level was not yet at goal.  2. Type 2 diabetes mellitus with other specified complication, unspecified whether long term insulin use [name]'s recent A1c was 5.4.  She notes some constipation, but this improves with fiber supplements and sometimes Dulcolax.  3. Emotional Eating Behavior [name] notes her stress has increased and she wonders if she can go back to Wellbutrin, as she felt it helped her mood more.  She has no history of seizure disorder.  Assessment/Plan:   1. Vitamin D deficiency [name] will continue prescription vitamin D, and we will refill for 1 month.  - Vitamin D, Ergocalciferol, (DRISDOL) 1.25 MG (50000 UNIT) CAPS capsule; Take 1 capsule (50,000 Units total) by mouth every 7 (seven) days.  Dispense: 4 capsule; Refill: 0  2. Type 2 diabetes mellitus with other specified complication, unspecified whether long term insulin use [name] will continue metformin and Mounjaro.  We will refill metformin for 90 days; and we will refill  Mounjaro 10 mg and Mounjaro 15 mg for 1 month.  [name] is to take which ever dose the pharmacy has available of Mounjaro.  - metFORMIN (GLUCOPHAGE) 500 MG tablet; Take 1 tablet (500 mg total) by mouth 2 (two) times daily with a meal.  Dispense: 180 tablet; Refill: 0 - tirzepatide (MOUNJARO) 10 MG/0.5ML Pen; Inject 10 mg into the skin once a week.  Dispense: 2 mL; Refill: 0 - tirzepatide (MOUNJARO) 15 MG/0.5ML Pen; Inject 15 mg into the skin once a week.  Dispense: 2 mL; Refill: 0  3. Emotional Eating Behavior [name] agreed to discontinue topiramate, and start Wellbutrin SR 150 mg every morning with no refills.  We will follow-up at her next visit.  - buPROPion (WELLBUTRIN SR) 150 MG 12 hr tablet; Take 1 tablet (150 mg total) by mouth daily.  Dispense: 30 tablet; Refill: 0  4. BMI 32.0-32.9,adult  5. Obesity, Beginning BMI 36.64 [name] is currently in the action stage of change. As such, her goal is to continue with weight loss efforts. She has agreed to keeping a food journal and adhering to recommended goals of 1200 calories and 75+ grams of protein daily.   [name] is to try to get more protein from food and less from shakes.  Behavioral modification strategies: increasing lean protein intake.  [name] has agreed to follow-up with our clinic in 4 weeks. She was informed of the importance of frequent follow-up visits to maximize her success with intensive lifestyle modifications for her multiple health conditions.   Objective:   Blood pressure (!) 89/67,  pulse 85, temperature 98 F (36.7 C), height  (1.727 m), weight 213 lb (96.6 kg), SpO2 97 %. Body mass index is 32.39 kg/m.  Lab Results  Component Value Date   CREATININE 0.97 04/21/2022   BUN 25 (H) 04/21/2022   NA 138 04/21/2022   K 4.0 04/21/2022   CL 101 04/21/2022   CO2 21 04/21/2022   Lab Results  Component Value Date   ALT 18 04/21/2022   AST 21 04/21/2022   ALKPHOS 108 04/21/2022   BILITOT 0.3  04/21/2022   Lab Results  Component Value Date   HGBA1C 5.4 04/21/2022   HGBA1C 5.5 12/09/2021   HGBA1C 5.5 04/27/2021   HGBA1C 5.7 (H) 12/31/2020   HGBA1C 5.8 (H) 08/13/2020   Lab Results  Component Value Date   INSULIN 13.8 04/21/2022   INSULIN 13.0 12/09/2021   INSULIN 28.5 (H) 04/27/2021   INSULIN 17.9 12/31/2020   INSULIN 14.2 08/13/2020   Lab Results  Component Value Date   TSH 2.270 12/09/2021   Lab Results  Component Value Date   CHOL 144 12/09/2021   HDL 55 12/09/2021   LDLCALC 74 12/09/2021   TRIG 78 12/09/2021   CHOLHDL 3 08/16/2016   Lab Results  Component Value Date   VD25OH 28.4 (L) 04/21/2022   VD25OH 46.6 12/09/2021   VD25OH 35.1 04/27/2021   Lab Results  Component Value Date   WBC 6.6 12/09/2021   HGB 12.1 12/09/2021   HCT 37.1 12/09/2021   MCV 87 12/09/2021   PLT 276 12/09/2021   No results found for: "IRON", "TIBC", "FERRITIN"  Attestation Statements:   Reviewed by clinician on day of visit: allergies, medications, problem list, medical history, surgical history, family history, social history, and previous encounter notes.   I, Burt Knack, am acting as transcriptionist for Quillian Quince, MD.  I have reviewed the above documentation for accuracy and completeness, and I agree with the above. -  Quillian Quince, MD

## 2022-08-12 ENCOUNTER — Other Ambulatory Visit (INDEPENDENT_AMBULATORY_CARE_PROVIDER_SITE_OTHER): Payer: Self-pay | Admitting: Family Medicine

## 2022-08-12 DIAGNOSIS — F3289 Other specified depressive episodes: Secondary | ICD-10-CM

## 2022-08-18 ENCOUNTER — Encounter (INDEPENDENT_AMBULATORY_CARE_PROVIDER_SITE_OTHER): Payer: Self-pay | Admitting: Family Medicine

## 2022-08-18 ENCOUNTER — Ambulatory Visit (INDEPENDENT_AMBULATORY_CARE_PROVIDER_SITE_OTHER): Payer: PPO | Admitting: Family Medicine

## 2022-08-18 VITALS — BP 118/81 | HR 78 | Temp 98.3°F | Ht 68.0 in | Wt 209.0 lb

## 2022-08-18 DIAGNOSIS — Z7984 Long term (current) use of oral hypoglycemic drugs: Secondary | ICD-10-CM

## 2022-08-18 DIAGNOSIS — Z6831 Body mass index (BMI) 31.0-31.9, adult: Secondary | ICD-10-CM

## 2022-08-18 DIAGNOSIS — Z7985 Long-term (current) use of injectable non-insulin antidiabetic drugs: Secondary | ICD-10-CM

## 2022-08-18 DIAGNOSIS — E669 Obesity, unspecified: Secondary | ICD-10-CM

## 2022-08-18 DIAGNOSIS — E559 Vitamin D deficiency, unspecified: Secondary | ICD-10-CM | POA: Diagnosis not present

## 2022-08-18 DIAGNOSIS — E1169 Type 2 diabetes mellitus with other specified complication: Secondary | ICD-10-CM | POA: Diagnosis not present

## 2022-08-18 MED ORDER — TIRZEPATIDE 15 MG/0.5ML ~~LOC~~ SOAJ: 15.0000 mg | SUBCUTANEOUS | 0 refills | Status: DC

## 2022-08-18 MED ORDER — VITAMIN D (ERGOCALCIFEROL) 1.25 MG (50000 UNIT) PO CAPS: 50000.0000 [IU] | ORAL_CAPSULE | ORAL | 0 refills | Status: DC

## 2022-08-19 LAB — HEMOGLOBIN A1C
Est. average glucose Bld gHb Est-mCnc: 97 mg/dL
Hgb A1c MFr Bld: 5 % (ref 4.8–5.6)

## 2022-08-19 LAB — MICROALBUMIN / CREATININE URINE RATIO
Creatinine, Urine: 73.3 mg/dL
Microalb/Creat Ratio: 9 mg/g creat (ref 0–29)
Microalbumin, Urine: 6.3 ug/mL

## 2022-08-19 LAB — LIPID PANEL WITH LDL/HDL RATIO
Cholesterol, Total: 152 mg/dL (ref 100–199)
HDL: 56 mg/dL (ref >39)
LDL Chol Calc (NIH): 79 mg/dL (ref 0–99)
LDL/HDL Ratio: 1.4 ratio (ref 0.0–3.2)
Triglycerides: 88 mg/dL (ref 0–149)
VLDL Cholesterol Cal: 17 mg/dL (ref 5–40)

## 2022-08-19 LAB — CMP14+EGFR
ALT: 16 IU/L (ref 0–32)
AST: 23 IU/L (ref 0–40)
Albumin/Globulin Ratio: 1.9 (ref 1.2–2.2)
Albumin: 4.5 g/dL (ref 3.8–4.9)
Alkaline Phosphatase: 104 IU/L (ref 44–121)
BUN/Creatinine Ratio: 26 — ABNORMAL HIGH (ref 9–23)
BUN: 24 mg/dL (ref 6–24)
Bilirubin Total: 0.3 mg/dL (ref 0.0–1.2)
CO2: 20 mmol/L (ref 20–29)
Calcium: 9.5 mg/dL (ref 8.7–10.2)
Chloride: 109 mmol/L — ABNORMAL HIGH (ref 96–106)
Creatinine, Ser: 0.93 mg/dL (ref 0.57–1.00)
Globulin, Total: 2.4 g/dL (ref 1.5–4.5)
Glucose: 79 mg/dL (ref 70–99)
Potassium: 4.5 mmol/L (ref 3.5–5.2)
Sodium: 142 mmol/L (ref 134–144)
Total Protein: 6.9 g/dL (ref 6.0–8.5)
eGFR: 74 mL/min/{1.73_m2} (ref >59)

## 2022-08-19 LAB — INSULIN, RANDOM: INSULIN: 17 u[IU]/mL (ref 2.6–24.9)

## 2022-08-19 LAB — VITAMIN D 25 HYDROXY (VIT D DEFICIENCY, FRACTURES): Vit D, 25-Hydroxy: 52.7 ng/mL (ref 30.0–100.0)

## 2022-08-19 LAB — VITAMIN B12: Vitamin B-12: 621 pg/mL (ref 232–1245)

## 2022-08-22 NOTE — Progress Notes (Unsigned)
Chief Complaint:   OBESITY [name] is here to discuss her progress with her obesity treatment plan along with follow-up of her obesity related diagnoses. [name] is on keeping a food journal and adhering to recommended goals of 1200 calories and 75+ grams of protein and states she is following her eating plan approximately 0% of the time. [name] states she is walking.    Today's visit was #: 79 Starting weight: 241 lbs Starting date: 09/28/2016 Today's weight: 209 lbs Today's date: 08/18/2022 Total lbs lost to date: 32 Total lbs lost since last in-office visit: 4  Interim History: [name] continues to do well with her weight loss.  She is caring for her elderly parents and she is much more active than previously.  Her hunger is controlled and she is working on increasing her protein.  Subjective:   1. Type 2 diabetes mellitus with other specified complication, unspecified whether long term insulin use (HCC) [name] is on [place] and metformin, and she is due for labs.  She is doing well with her diet and has increased her exercise.  2. Vitamin D deficiency [name] is on vitamin D, and she is due for labs.  She has no signs of over replacement.  Assessment/Plan:   1. Type 2 diabetes mellitus with other specified complication, unspecified whether long term insulin use (HCC) We will check labs today, and we will refill Mounjaro at 15 mg once weekly for 90 days.  - CMP14+EGFR - Vitamin B12 - Lipid Panel With LDL/HDL Ratio - Insulin, random - Hemoglobin A1c - Microalbumin / creatinine urine ratio - tirzepatide (MOUNJARO) 15 MG/0.5ML Pen; Inject 15 mg into the skin once a week.  Dispense: 6 mL; Refill: 0  2. Vitamin D deficiency We will check labs today, and we will refill prescription vitamin D for 1 month.  - Vitamin D, Ergocalciferol, (DRISDOL) 1.25 MG (50000 UNIT) CAPS capsule; Take 1 capsule (50,000 Units total) by mouth every 7 (seven) days.  Dispense: 4 capsule;  Refill: 0 - VITAMIN D 25 Hydroxy (Vit-D Deficiency, Fractures)  3. BMI 31.0-31.9,adult  4. Obesity, Beginning BMI 36.64 [name] is currently in the action stage of change. As such, her goal is to continue with weight loss efforts. She has agreed to keeping a food journal and adhering to recommended goals of 1200 calories and 75+ grams of protein daily.   Exercise goals: As is.   Behavioral modification strategies: increasing lean protein intake.  [name] has agreed to follow-up with our clinic in 4 weeks. She was informed of the importance of frequent follow-up visits to maximize her success with intensive lifestyle modifications for her multiple health conditions.   [name] was informed we would discuss her lab results at her next visit unless there is a critical issue that needs to be addressed sooner. [name] agreed to keep her next visit at the agreed upon time to discuss these results.  Objective:   Blood pressure 118/81, pulse 78, temperature 98.3 F (36.8 C), height 5\' 8"  (1.727 m), weight 209 lb (94.8 kg), SpO2 99 %. Body mass index is 31.78 kg/m.  Lab Results  Component Value Date   CREATININE 0.93 08/18/2022   BUN 24 08/18/2022   NA 142 08/18/2022   K 4.5 08/18/2022   CL 109 (H) 08/18/2022   CO2 20 08/18/2022   Lab Results  Component Value Date   ALT 16 08/18/2022   AST 23 08/18/2022   ALKPHOS 104 08/18/2022   BILITOT 0.3 08/18/2022  Lab Results  Component Value Date   HGBA1C 5.0 08/18/2022   HGBA1C 5.4 04/21/2022   HGBA1C 5.5 12/09/2021   HGBA1C 5.5 04/27/2021   HGBA1C 5.7 (H) 12/31/2020   Lab Results  Component Value Date   INSULIN 17.0 08/18/2022   INSULIN 13.8 04/21/2022   INSULIN 13.0 12/09/2021   INSULIN 28.5 (H) 04/27/2021   INSULIN 17.9 12/31/2020   Lab Results  Component Value Date   TSH 2.270 12/09/2021   Lab Results  Component Value Date   CHOL 152 08/18/2022   HDL 56 08/18/2022   LDLCALC 79 08/18/2022   TRIG 88 08/18/2022    CHOLHDL 3 08/16/2016   Lab Results  Component Value Date   VD25OH 52.7 08/18/2022   VD25OH 28.4 (L) 04/21/2022   VD25OH 46.6 12/09/2021   Lab Results  Component Value Date   WBC 6.6 12/09/2021   HGB 12.1 12/09/2021   HCT 37.1 12/09/2021   MCV 87 12/09/2021   PLT 276 12/09/2021   No results found for: "IRON", "TIBC", "FERRITIN"  Attestation Statements:   Reviewed by clinician on day of visit: allergies, medications, problem list, medical history, surgical history, family history, social history, and previous encounter notes.   I, Burt Knack, am acting as transcriptionist for Quillian Quince, MD.  I have reviewed the above documentation for accuracy and completeness, and I agree with the above. -  Quillian Quince, MD

## 2022-08-23 ENCOUNTER — Other Ambulatory Visit: Payer: Self-pay | Admitting: Internal Medicine

## 2022-08-23 DIAGNOSIS — Z1231 Encounter for screening mammogram for malignant neoplasm of breast: Secondary | ICD-10-CM

## 2022-08-31 ENCOUNTER — Encounter (INDEPENDENT_AMBULATORY_CARE_PROVIDER_SITE_OTHER): Payer: Self-pay | Admitting: Family Medicine

## 2022-09-07 ENCOUNTER — Other Ambulatory Visit (INDEPENDENT_AMBULATORY_CARE_PROVIDER_SITE_OTHER): Payer: Self-pay | Admitting: Family Medicine

## 2022-09-07 DIAGNOSIS — E559 Vitamin D deficiency, unspecified: Secondary | ICD-10-CM

## 2022-09-09 DIAGNOSIS — M19112 Post-traumatic osteoarthritis, left shoulder: Secondary | ICD-10-CM | POA: Diagnosis not present

## 2022-09-09 DIAGNOSIS — M7542 Impingement syndrome of left shoulder: Secondary | ICD-10-CM | POA: Diagnosis not present

## 2022-09-09 DIAGNOSIS — M7541 Impingement syndrome of right shoulder: Secondary | ICD-10-CM | POA: Diagnosis not present

## 2022-09-15 ENCOUNTER — Ambulatory Visit (INDEPENDENT_AMBULATORY_CARE_PROVIDER_SITE_OTHER): Payer: PPO | Admitting: Family Medicine

## 2022-09-15 ENCOUNTER — Encounter (INDEPENDENT_AMBULATORY_CARE_PROVIDER_SITE_OTHER): Payer: Self-pay | Admitting: Family Medicine

## 2022-09-15 ENCOUNTER — Ambulatory Visit
Admission: RE | Admit: 2022-09-15 | Discharge: 2022-09-15 | Disposition: A | Discharge status: Home / Self Care | Payer: PPO | Source: Ambulatory Visit | Attending: Internal Medicine | Admitting: Internal Medicine

## 2022-09-15 VITALS — BP 126/82 | Temp 98.8°F | Ht 68.0 in | Wt 210.0 lb

## 2022-09-15 DIAGNOSIS — F3289 Other specified depressive episodes: Secondary | ICD-10-CM | POA: Diagnosis not present

## 2022-09-15 DIAGNOSIS — E669 Obesity, unspecified: Secondary | ICD-10-CM | POA: Diagnosis not present

## 2022-09-15 DIAGNOSIS — E559 Vitamin D deficiency, unspecified: Secondary | ICD-10-CM

## 2022-09-15 DIAGNOSIS — Z1231 Encounter for screening mammogram for malignant neoplasm of breast: Secondary | ICD-10-CM

## 2022-09-15 DIAGNOSIS — E1169 Type 2 diabetes mellitus with other specified complication: Secondary | ICD-10-CM | POA: Diagnosis not present

## 2022-09-15 DIAGNOSIS — K5900 Constipation, unspecified: Secondary | ICD-10-CM

## 2022-09-15 DIAGNOSIS — Z6831 Body mass index (BMI) 31.0-31.9, adult: Secondary | ICD-10-CM

## 2022-09-15 MED ORDER — BUPROPION HCL ER (SR) 150 MG PO TB12: 150.0000 mg | ORAL_TABLET | Freq: Every day | ORAL | 0 refills | Status: DC

## 2022-09-15 MED ORDER — VITAMIN D (ERGOCALCIFEROL) 1.25 MG (50000 UNIT) PO CAPS
50000.0000 [IU] | ORAL_CAPSULE | ORAL | 0 refills | Status: DC
Start: 2022-09-15 — End: 2022-10-18

## 2022-09-15 MED ORDER — TIRZEPATIDE 10 MG/0.5ML ~~LOC~~ SOAJ: 10.0000 mg | SUBCUTANEOUS | 0 refills | Status: DC

## 2022-09-15 NOTE — Progress Notes (Signed)
[facility]  Office: [phone]  /  Fax: [phone]  WEIGHT SUMMARY AND BIOMETRICS  Anthropometric Measurements Height: 5\' 8"  (1.727 m) Weight: 210 lb (95.3 kg) BMI (Calculated): 31.94 Weight at Last Visit: 209 lb Weight Lost Since Last Visit: 0 Weight Gained Since Last Visit: 1 lb   Body Composition  Body Fat %: 40.9 % Fat Mass (lbs): 85.8 lbs Muscle Mass (lbs): 118 lbs Total Body Water (lbs): 80 lbs Visceral Fat Rating : 10   Other Clinical Data Fasting: No Labs: No Today's Visit #: 80    Chief Complaint: OBESITY  Discussed the use of AI scribe software for clinical note transcription with the patient, who gave verbal consent to proceed.  History of Present Illness   Suprina, a 52 year old individual with obesity, diabetes, vitamin D deficiency, and depression, presents with concerns about weight management and emotional eating behaviors. She reports adherence to a 1200 calorie, 75 grams of protein diet approximately 40% of the time. She does not engage in formal exercise but has increased outdoor activities.  Recently, she experienced a weight gain of one pound, which she attributes to muscle gain from increased physical activity. She also reports a history of shoulder injections due to tears in both shoulders.  Adasyn has been dealing with significant family stress, with a recent hospitalization of their father due to a mild heart attack and subsequent health decline. She reports increased consumption of soft drinks during this period for caffeine intake, acknowledging the potential impact on her insulin levels.  She has been experiencing constipation, going up to six days without bowel movements, and has restarted Linzess to manage this. She also reports a struggle with obtaining her prescribed Mounjaro medication due to shortages, noting a difference in her condition when on a lower dose.  Eleanna's recent blood work results were satisfactory, with good cholesterol,  triglycerides, LDL, vitamin D, B12, and fasting glucose levels. However, she expresses concern about her insulin levels. Despite the challenges, she reports feeling better overall and is committed to maintaining her current diet and activity levels.          PHYSICAL EXAM:  Blood pressure 126/82, temperature 98.8 F (37.1 C), height 5\' 8"  (1.727 m), weight 210 lb (95.3 kg), SpO2 98 %. Body mass index is 31.93 kg/m.  DIAGNOSTIC DATA REVIEWED:  BMET    Component Value Date/Time   NA 142 08/18/2022 1103   K 4.5 08/18/2022 1103   CL 109 (H) 08/18/2022 1103   CO2 20 08/18/2022 1103   GLUCOSE 79 08/18/2022 1103   GLUCOSE 80 04/28/2020 1444   BUN 24 08/18/2022 1103   CREATININE 0.93 08/18/2022 1103   CALCIUM 9.5 08/18/2022 1103   GFRNONAA 109 11/11/2019 1049   GFRAA 125 11/11/2019 1049   Lab Results  Component Value Date   HGBA1C 5.0 08/18/2022   HGBA1C 6.1 09/22/2010   Lab Results  Component Value Date   INSULIN 17.0 08/18/2022   INSULIN 14.7 09/28/2016   Lab Results  Component Value Date   TSH 2.270 12/09/2021   CBC    Component Value Date/Time   WBC 6.6 12/09/2021 1442   WBC 13.3 (H) 04/28/2020 1444   RBC 4.27 12/09/2021 1442   RBC 4.99 04/28/2020 1444   HGB 12.1 12/09/2021 1442   HCT 37.1 12/09/2021 1442   PLT 276 12/09/2021 1442   MCV 87 12/09/2021 1442   MCH 28.3 12/09/2021 1442   MCH 27.0 04/19/2015 0650   MCHC 32.6 12/09/2021 1442   MCHC 32.1  04/28/2020 1444   RDW 13.4 12/09/2021 1442   Iron Studies No results found for: "IRON", "TIBC", "FERRITIN", "IRONPCTSAT" Lipid Panel     Component Value Date/Time   CHOL 152 08/18/2022 1103   TRIG 88 08/18/2022 1103   HDL 56 08/18/2022 1103   CHOLHDL 3 08/16/2016 1011   VLDL 12.2 08/16/2016 1011   LDLCALC 79 08/18/2022 1103   Hepatic Function Panel     Component Value Date/Time   PROT 6.9 08/18/2022 1103   ALBUMIN 4.5 08/18/2022 1103   AST 23 08/18/2022 1103   ALT 16 08/18/2022 1103   ALKPHOS 104  08/18/2022 1103   BILITOT 0.3 08/18/2022 1103   BILIDIR 0.1 06/12/2013 1632   IBILI 0.2 06/11/2009 2309      Component Value Date/Time   TSH 2.270 12/09/2021 1442   Nutritional Lab Results  Component Value Date   VD25OH 52.7 08/18/2022   VD25OH 28.4 (L) 04/21/2022   VD25OH 46.6 12/09/2021     Assessment and Plan    Obesity and Emotional Eating: Despite recent stressors, patient has been maintaining a portion control diet and has increased physical activity through yard work. Noted a 1lb weight gain, which appears to be muscle mass. -Continue current diet and physical activity regimen. -Consider returning to journaling if weight loss stalls or reverses.  Type 2 Diabetes: A1c is 5, fasting glucose 79, and insulin 2, indicating good control. -Continue current management plan.  Vitamin D Deficiency: Recent labs show vitamin D levels are within normal range. -Continue current supplementation.  Depression with EEB: Patient reports high stress levels due to family health issues, but no specific depressive symptoms mentioned in this visit. -Continue current management plan.  Medication Access: Patient experiencing difficulty obtaining Mounjaro due to shortages. -Attempt to refill 10mg  and 15mg  Mounjaro and pt to get whichever dose is available at her pharmacy -Continue to monitor situation and adjust as necessary.  Constipation: Patient reports infrequent bowel movements, possibly related to medication. -Advise increased hydration, especially with increased physical activity. -Continue Linzess as needed.  Follow-up in 4 weeks.          She was informed of the importance of frequent follow up visits to maximize her success with intensive lifestyle modifications for her multiple health conditions. Return in about 4 weeks (around 10/13/2022).   Quillian Quince, MD

## 2022-10-05 DIAGNOSIS — M159 Polyosteoarthritis, unspecified: Secondary | ICD-10-CM | POA: Diagnosis not present

## 2022-10-05 DIAGNOSIS — Z7960 Long term (current) use of unspecified immunomodulators and immunosuppressants: Secondary | ICD-10-CM | POA: Diagnosis not present

## 2022-10-05 DIAGNOSIS — L409 Psoriasis, unspecified: Secondary | ICD-10-CM | POA: Diagnosis not present

## 2022-10-05 DIAGNOSIS — L4050 Arthropathic psoriasis, unspecified: Secondary | ICD-10-CM | POA: Diagnosis not present

## 2022-10-18 ENCOUNTER — Encounter (INDEPENDENT_AMBULATORY_CARE_PROVIDER_SITE_OTHER): Payer: Self-pay | Admitting: Family Medicine

## 2022-10-18 ENCOUNTER — Ambulatory Visit (INDEPENDENT_AMBULATORY_CARE_PROVIDER_SITE_OTHER): Payer: PPO | Admitting: Family Medicine

## 2022-10-18 VITALS — BP 125/75 | HR 70 | Temp 98.4°F | Ht 68.0 in | Wt 213.0 lb

## 2022-10-18 DIAGNOSIS — M25561 Pain in right knee: Secondary | ICD-10-CM

## 2022-10-18 DIAGNOSIS — E669 Obesity, unspecified: Secondary | ICD-10-CM

## 2022-10-18 DIAGNOSIS — E559 Vitamin D deficiency, unspecified: Secondary | ICD-10-CM | POA: Diagnosis not present

## 2022-10-18 DIAGNOSIS — Z7985 Long-term (current) use of injectable non-insulin antidiabetic drugs: Secondary | ICD-10-CM | POA: Diagnosis not present

## 2022-10-18 DIAGNOSIS — E1169 Type 2 diabetes mellitus with other specified complication: Secondary | ICD-10-CM

## 2022-10-18 DIAGNOSIS — Z6832 Body mass index (BMI) 32.0-32.9, adult: Secondary | ICD-10-CM

## 2022-10-18 MED ORDER — TIRZEPATIDE 15 MG/0.5ML ~~LOC~~ SOAJ
15.0000 mg | SUBCUTANEOUS | 0 refills | Status: DC
Start: 2022-10-18 — End: 2022-10-25

## 2022-10-18 MED ORDER — TIRZEPATIDE 12.5 MG/0.5ML ~~LOC~~ SOAJ
12.5000 mg | SUBCUTANEOUS | 0 refills | Status: DC
Start: 2022-10-18 — End: 2022-10-25

## 2022-10-18 MED ORDER — VITAMIN D (ERGOCALCIFEROL) 1.25 MG (50000 UNIT) PO CAPS: 50000.0000 [IU] | ORAL_CAPSULE | ORAL | 0 refills | Status: DC

## 2022-10-18 NOTE — Progress Notes (Addendum)
.smr  Office: [phone]  /  Fax: [phone]  WEIGHT SUMMARY AND BIOMETRICS  Anthropometric Measurements Height: 5\' 8"  (1.727 m) Weight: 213 lb (96.6 kg) BMI (Calculated): 32.39 Weight at Last Visit: 210 lb Weight Lost Since Last Visit: 0 Weight Gained Since Last Visit: 3 lb Starting Weight: 241 lb Total Weight Loss (lbs): 28 lb (12.7 kg)   Body Composition  Body Fat %: 42.7 % Fat Mass (lbs): 91.2 lbs Muscle Mass (lbs): 116 lbs Total Body Water (lbs): 83.2 lbs Visceral Fat Rating : 10   Other Clinical Data Fasting: No Labs: No Today's Visit #: 69 Starting Date: 09/28/16    Chief Complaint: OBESITY   AI transcription software used today.  History of Present Illness   The patient is a 52 year old individual with a history of obesity, arthritis, and a previous hip replacement. She presents today with concerns about a recent weight gain of three pounds over the past month, despite attempts to maintain a food journal and increase physical activity through yard work. The patient also reports increased pain in the right knee, which she attributes to overexertion from mowing the lawn. She has been managing the pain with Voltaren and is considering seeking an injection for relief.  The patient also reports difficulty adhering to her diet due to the summer heat, which has led to a decreased appetite and increased consumption of sweet tea. She has been trying to incorporate more fruit into her diet and has been making efforts to prepare healthier meals at home. However, she expresses frustration with her inability to lose weight and maintain her metabolism.  The patient has been prescribed Mounjaro for her Diabetes but has had difficulty obtaining the medication from her local pharmacy. She has been taking Ozempic as an alternative, but she reports that it has not been as effective in managing her weight.    The patient also takes vitamin D and has had her labs checked as  recently as May.  In addition to her struggles with weight management, the patient has been dealing with the physical demands of yard work and the care of her elderly parents. Despite these challenges, she is motivated to improve her health and is actively seeking solutions to her current health concerns.          PHYSICAL EXAM:  Blood pressure 125/75, pulse 70, temperature 98.4 F (36.9 C), height 5\' 8"  (1.727 m), weight 213 lb (96.6 kg), SpO2 98%. Body mass index is 32.39 kg/m.  DIAGNOSTIC DATA REVIEWED:  BMET    Component Value Date/Time   NA 142 08/18/2022 1103   K 4.5 08/18/2022 1103   CL 109 (H) 08/18/2022 1103   CO2 20 08/18/2022 1103   GLUCOSE 79 08/18/2022 1103   GLUCOSE 80 04/28/2020 1444   BUN 24 08/18/2022 1103   CREATININE 0.93 08/18/2022 1103   CALCIUM 9.5 08/18/2022 1103   GFRNONAA 109 11/11/2019 1049   GFRAA 125 11/11/2019 1049   Lab Results  Component Value Date   HGBA1C 5.0 08/18/2022   HGBA1C 6.1 09/22/2010   Lab Results  Component Value Date   INSULIN 17.0 08/18/2022   INSULIN 14.7 09/28/2016   Lab Results  Component Value Date   TSH 2.270 12/09/2021   CBC    Component Value Date/Time   WBC 6.6 12/09/2021 1442   WBC 13.3 (H) 04/28/2020 1444   RBC 4.27 12/09/2021 1442   RBC 4.99 04/28/2020 1444   HGB 12.1 12/09/2021 1442   HCT 37.1 12/09/2021 1442  PLT 276 12/09/2021 1442   MCV 87 12/09/2021 1442   MCH 28.3 12/09/2021 1442   MCH 27.0 04/19/2015 0650   MCHC 32.6 12/09/2021 1442   MCHC 32.1 04/28/2020 1444   RDW 13.4 12/09/2021 1442   Iron Studies No results found for: "IRON", "TIBC", "FERRITIN", "IRONPCTSAT" Lipid Panel     Component Value Date/Time   CHOL 152 08/18/2022 1103   TRIG 88 08/18/2022 1103   HDL 56 08/18/2022 1103   CHOLHDL 3 08/16/2016 1011   VLDL 12.2 08/16/2016 1011   LDLCALC 79 08/18/2022 1103   Hepatic Function Panel     Component Value Date/Time   PROT 6.9 08/18/2022 1103   ALBUMIN 4.5 08/18/2022 1103    AST 23 08/18/2022 1103   ALT 16 08/18/2022 1103   ALKPHOS 104 08/18/2022 1103   BILITOT 0.3 08/18/2022 1103   BILIDIR 0.1 06/12/2013 1632   IBILI 0.2 06/11/2009 2309      Component Value Date/Time   TSH 2.270 12/09/2021 1442   Nutritional Lab Results  Component Value Date   VD25OH 52.7 08/18/2022   VD25OH 28.4 (L) 04/21/2022   VD25OH 46.6 12/09/2021     Assessment and Plan    Obesity and Diabetes II: Weight gain of 3 pounds in the last month. Difficulty adhering to food journal and formal exercise. Currently on Ozempic, but not achieving desired results. Difficulty obtaining Mounjaro. -Attempt to obtain Mounjaro 12.5mg  or 15mg , whichever is available first instead of Ozempic -Continue efforts to increase physical activity and maintain food journal. -Consider cold protein ideas and hydration strategies discussed. -Check labs in 1-2 months.   Vit D deficiency: at goal -refill vit D Rx and follow  Right Knee Pain: Pain exacerbated by yard work. No mention of swelling. Currently using Voltaren and knee brace for symptom management. History of arthritis. -Consider contacting orthopedist for possible corticosteroid injection. -Continue current management strategies.  Follow-up in 4-5 weeks.       She was informed of the importance of frequent follow up visits to maximize her success with intensive lifestyle modifications for her multiple health conditions.    Quillian Quince, MD

## 2022-10-24 ENCOUNTER — Encounter (INDEPENDENT_AMBULATORY_CARE_PROVIDER_SITE_OTHER): Payer: Self-pay | Admitting: Family Medicine

## 2022-10-24 DIAGNOSIS — E1169 Type 2 diabetes mellitus with other specified complication: Secondary | ICD-10-CM

## 2022-10-24 DIAGNOSIS — R682 Dry mouth, unspecified: Secondary | ICD-10-CM | POA: Diagnosis not present

## 2022-10-24 DIAGNOSIS — M3500 Sicca syndrome, unspecified: Secondary | ICD-10-CM | POA: Diagnosis not present

## 2022-10-24 DIAGNOSIS — H6063 Unspecified chronic otitis externa, bilateral: Secondary | ICD-10-CM | POA: Diagnosis not present

## 2022-10-25 ENCOUNTER — Other Ambulatory Visit (HOSPITAL_COMMUNITY): Payer: Self-pay

## 2022-10-25 MED ORDER — TIRZEPATIDE 12.5 MG/0.5ML ~~LOC~~ SOAJ
12.5000 mg | SUBCUTANEOUS | 0 refills | Status: DC
Start: 2022-10-25 — End: 2022-12-20
  Filled 2022-10-25: qty 6, 84d supply, fill #0

## 2022-10-25 MED ORDER — TIRZEPATIDE 15 MG/0.5ML ~~LOC~~ SOAJ
15.0000 mg | SUBCUTANEOUS | 0 refills | Status: DC
Start: 2022-10-25 — End: 2022-11-15
  Filled 2022-10-25: qty 6, 84d supply, fill #0
  Filled 2022-10-25: qty 2, 28d supply, fill #0

## 2022-10-26 ENCOUNTER — Other Ambulatory Visit (HOSPITAL_COMMUNITY): Payer: Self-pay

## 2022-11-07 ENCOUNTER — Telehealth: Payer: Self-pay | Admitting: Internal Medicine

## 2022-11-07 ENCOUNTER — Other Ambulatory Visit: Payer: Self-pay | Admitting: Internal Medicine

## 2022-11-07 ENCOUNTER — Other Ambulatory Visit: Payer: Self-pay

## 2022-11-07 MED ORDER — LISINOPRIL 20 MG PO TABS: 20.0000 mg | ORAL_TABLET | Freq: Every day | ORAL | 1 refills | Status: DC

## 2022-11-07 NOTE — Telephone Encounter (Signed)
Refill sent today.

## 2022-11-07 NOTE — Telephone Encounter (Signed)
Prescription Request  11/07/2022  LOV: 06/28/2022  What is the name of the medication or equipment? lisinopril (ZESTRIL) 20 MG tablet   Have you contacted your pharmacy to request a refill? No   [facility] [phone] [place], [place] - [address] AT [facility] [address] [place] [address] Phone: [phone] Fax: [phone]  Patient notified that their request is being sent to the clinical staff for review and that they should receive a response within 2 business days.   Please advise at Mobile [phone] (mobile)

## 2022-11-15 ENCOUNTER — Ambulatory Visit (INDEPENDENT_AMBULATORY_CARE_PROVIDER_SITE_OTHER): Payer: PPO | Admitting: Family Medicine

## 2022-11-15 ENCOUNTER — Other Ambulatory Visit (HOSPITAL_COMMUNITY): Payer: Self-pay

## 2022-11-15 ENCOUNTER — Encounter (INDEPENDENT_AMBULATORY_CARE_PROVIDER_SITE_OTHER): Payer: Self-pay | Admitting: Family Medicine

## 2022-11-15 VITALS — BP 128/79 | HR 84 | Temp 98.9°F | Ht 68.0 in | Wt 209.0 lb

## 2022-11-15 DIAGNOSIS — E1169 Type 2 diabetes mellitus with other specified complication: Secondary | ICD-10-CM

## 2022-11-15 DIAGNOSIS — F3289 Other specified depressive episodes: Secondary | ICD-10-CM | POA: Diagnosis not present

## 2022-11-15 DIAGNOSIS — Z7985 Long-term (current) use of injectable non-insulin antidiabetic drugs: Secondary | ICD-10-CM | POA: Diagnosis not present

## 2022-11-15 DIAGNOSIS — E669 Obesity, unspecified: Secondary | ICD-10-CM

## 2022-11-15 DIAGNOSIS — Z6831 Body mass index (BMI) 31.0-31.9, adult: Secondary | ICD-10-CM

## 2022-11-15 DIAGNOSIS — E559 Vitamin D deficiency, unspecified: Secondary | ICD-10-CM

## 2022-11-15 MED ORDER — TIRZEPATIDE 15 MG/0.5ML ~~LOC~~ SOAJ
15.0000 mg | SUBCUTANEOUS | 0 refills | Status: DC
Start: 2022-11-15 — End: 2022-11-15

## 2022-11-15 MED ORDER — VITAMIN D (ERGOCALCIFEROL) 1.25 MG (50000 UNIT) PO CAPS: 50000.0000 [IU] | ORAL_CAPSULE | ORAL | 0 refills | Status: DC

## 2022-11-15 MED ORDER — TIRZEPATIDE 15 MG/0.5ML ~~LOC~~ SOAJ
15.0000 mg | SUBCUTANEOUS | 0 refills | Status: DC
Start: 2022-11-15 — End: 2022-12-20
  Filled 2022-11-15: qty 2, 28d supply, fill #0

## 2022-11-15 MED ORDER — BUPROPION HCL ER (SR) 150 MG PO TB12: 150.0000 mg | ORAL_TABLET | Freq: Every day | ORAL | 0 refills | Status: DC

## 2022-11-16 ENCOUNTER — Other Ambulatory Visit (HOSPITAL_COMMUNITY): Payer: Self-pay

## 2022-11-16 ENCOUNTER — Other Ambulatory Visit: Payer: Self-pay

## 2022-11-16 NOTE — Progress Notes (Unsigned)
Chief Complaint:   OBESITY [name] is here to discuss her progress with her obesity treatment plan along with follow-up of her obesity related diagnoses. [name] is on keeping a food journal and adhering to recommended goals of 1200 calories and 75+ grams of protein and states she is following her eating plan approximately 0% of the time. [name] states she is doing yard work and swimming.    Today's visit was #: 82 Starting weight: 241 lbs Starting date: 09/28/2016 Today's weight: 209 lbs Today's date: 11/15/2022 Total lbs lost to date: 32 Total lbs lost since last in-office visit: 4  Interim History: Patient is doing well with her weight loss.  Her hunger is better controlled and she is working on her diet and exercise.  Subjective:   1. Type 2 diabetes mellitus with other specified complication, unspecified whether long term insulin use (HCC) Patient is doing better on 15 mg dose of Mounjaro.  She is working on her diet and weight loss.  2. Vitamin D deficiency Patient is on vitamin D, and her last level was at goal.  3. Emotional Eating Behavior Patient is doing well on her medications, and she has decreased emotional eating behavior.  Assessment/Plan:   1. Type 2 diabetes mellitus with other specified complication, unspecified whether long term insulin use (HCC) Patient will continue Mounjaro 15 mg, and we will refill for 90 days.  - tirzepatide (MOUNJARO) 15 MG/0.5ML Pen; Inject 15 mg into the skin once a week.  Dispense: 6 mL; Refill: 0  2. Vitamin D deficiency Patient will continue once weekly vitamin D, and we will refill for 1 month.  - Vitamin D, Ergocalciferol, (DRISDOL) 1.25 MG (50000 UNIT) CAPS capsule; Take 1 capsule (50,000 Units total) by mouth every 7 (seven) days.  Dispense: 4 capsule; Refill: 0  3. Emotional Eating Behavior Patient will continue Wellbutrin SR, and we will refill for 90 days.  - buPROPion (WELLBUTRIN SR) 150 MG 12 hr tablet; Take 1  tablet (150 mg total) by mouth daily.  Dispense: 90 tablet; Refill: 0  4. BMI 31.0-31.9,adult  5. Obesity, Beginning BMI 36.64 [name] is currently in the action stage of change. As such, her goal is to continue with weight loss efforts. She has agreed to keeping a food journal and adhering to recommended goals of 1200 calories and 85+ grams of protein daily.   Exercise goals: As is.   Behavioral modification strategies: increasing lean protein intake and meal planning and cooking strategies.  [name] has agreed to follow-up with our clinic in 5 weeks. She was informed of the importance of frequent follow-up visits to maximize her success with intensive lifestyle modifications for her multiple health conditions.   Objective:   Blood pressure 128/79, pulse 84, temperature 98.9 F (37.2 C), height 5\' 8"  (1.727 m), weight 209 lb (94.8 kg), SpO2 98%. Body mass index is 31.78 kg/m.  Lab Results  Component Value Date   CREATININE 0.93 08/18/2022   BUN 24 08/18/2022   NA 142 08/18/2022   K 4.5 08/18/2022   CL 109 (H) 08/18/2022   CO2 20 08/18/2022   Lab Results  Component Value Date   ALT 16 08/18/2022   AST 23 08/18/2022   ALKPHOS 104 08/18/2022   BILITOT 0.3 08/18/2022   Lab Results  Component Value Date   HGBA1C 5.0 08/18/2022   HGBA1C 5.4 04/21/2022   HGBA1C 5.5 12/09/2021   HGBA1C 5.5 04/27/2021   HGBA1C 5.7 (H) 12/31/2020   Lab Results  Component Value Date   INSULIN 17.0 08/18/2022   INSULIN 13.8 04/21/2022   INSULIN 13.0 12/09/2021   INSULIN 28.5 (H) 04/27/2021   INSULIN 17.9 12/31/2020   Lab Results  Component Value Date   TSH 2.270 12/09/2021   Lab Results  Component Value Date   CHOL 152 08/18/2022   HDL 56 08/18/2022   LDLCALC 79 08/18/2022   TRIG 88 08/18/2022   CHOLHDL 3 08/16/2016   Lab Results  Component Value Date   VD25OH 52.7 08/18/2022   VD25OH 28.4 (L) 04/21/2022   VD25OH 46.6 12/09/2021   Lab Results  Component Value Date   WBC  6.6 12/09/2021   HGB 12.1 12/09/2021   HCT 37.1 12/09/2021   MCV 87 12/09/2021   PLT 276 12/09/2021   No results found for: "IRON", "TIBC", "FERRITIN"  Attestation Statements:   Reviewed by clinician on day of visit: allergies, medications, problem list, medical history, surgical history, family history, social history, and previous encounter notes.   I, Burt Knack, am acting as transcriptionist for Quillian Quince, MD.  I have reviewed the above documentation for accuracy and completeness, and I agree with the above. -  Quillian Quince, MD

## 2022-11-17 ENCOUNTER — Encounter (INDEPENDENT_AMBULATORY_CARE_PROVIDER_SITE_OTHER): Payer: Self-pay

## 2022-11-18 ENCOUNTER — Other Ambulatory Visit: Payer: Self-pay | Admitting: Oncology

## 2022-11-18 DIAGNOSIS — Z006 Encounter for examination for normal comparison and control in clinical research program: Secondary | ICD-10-CM

## 2022-11-22 ENCOUNTER — Encounter: Payer: Self-pay | Admitting: Internal Medicine

## 2022-12-01 ENCOUNTER — Other Ambulatory Visit: Payer: Self-pay | Admitting: Internal Medicine

## 2022-12-16 DIAGNOSIS — M19011 Primary osteoarthritis, right shoulder: Secondary | ICD-10-CM | POA: Diagnosis not present

## 2022-12-16 DIAGNOSIS — M7542 Impingement syndrome of left shoulder: Secondary | ICD-10-CM | POA: Diagnosis not present

## 2022-12-16 DIAGNOSIS — M19112 Post-traumatic osteoarthritis, left shoulder: Secondary | ICD-10-CM | POA: Diagnosis not present

## 2022-12-16 DIAGNOSIS — M7541 Impingement syndrome of right shoulder: Secondary | ICD-10-CM | POA: Diagnosis not present

## 2022-12-20 ENCOUNTER — Ambulatory Visit (INDEPENDENT_AMBULATORY_CARE_PROVIDER_SITE_OTHER): Payer: PPO | Admitting: Family Medicine

## 2022-12-20 ENCOUNTER — Encounter (INDEPENDENT_AMBULATORY_CARE_PROVIDER_SITE_OTHER): Payer: Self-pay | Admitting: Family Medicine

## 2022-12-20 VITALS — BP 138/81 | HR 72 | Temp 98.5°F | Ht 68.0 in | Wt 206.0 lb

## 2022-12-20 DIAGNOSIS — E1169 Type 2 diabetes mellitus with other specified complication: Secondary | ICD-10-CM

## 2022-12-20 DIAGNOSIS — Z6831 Body mass index (BMI) 31.0-31.9, adult: Secondary | ICD-10-CM

## 2022-12-20 DIAGNOSIS — F5089 Other specified eating disorder: Secondary | ICD-10-CM

## 2022-12-20 DIAGNOSIS — E538 Deficiency of other specified B group vitamins: Secondary | ICD-10-CM

## 2022-12-20 DIAGNOSIS — F3289 Other specified depressive episodes: Secondary | ICD-10-CM

## 2022-12-20 DIAGNOSIS — E669 Obesity, unspecified: Secondary | ICD-10-CM | POA: Diagnosis not present

## 2022-12-20 DIAGNOSIS — Z7984 Long term (current) use of oral hypoglycemic drugs: Secondary | ICD-10-CM

## 2022-12-20 DIAGNOSIS — Z7985 Long-term (current) use of injectable non-insulin antidiabetic drugs: Secondary | ICD-10-CM

## 2022-12-20 DIAGNOSIS — E559 Vitamin D deficiency, unspecified: Secondary | ICD-10-CM | POA: Diagnosis not present

## 2022-12-20 MED ORDER — BUPROPION HCL ER (SR) 150 MG PO TB12: 150.0000 mg | ORAL_TABLET | Freq: Every day | ORAL | 0 refills | Status: DC

## 2022-12-20 MED ORDER — TIRZEPATIDE 15 MG/0.5ML ~~LOC~~ SOAJ
15.0000 mg | SUBCUTANEOUS | 0 refills | Status: DC
Start: 2022-12-20 — End: 2023-02-21

## 2022-12-20 MED ORDER — VITAMIN D (ERGOCALCIFEROL) 1.25 MG (50000 UNIT) PO CAPS: 50000.0000 [IU] | ORAL_CAPSULE | ORAL | 0 refills | Status: DC

## 2022-12-20 NOTE — Progress Notes (Signed)
.smr  Office: [phone]  /  Fax: [phone]  WEIGHT SUMMARY AND BIOMETRICS  Anthropometric Measurements Height: 5\' 8"  (1.727 m) Weight: 206 lb (93.4 kg) BMI (Calculated): 31.33 Weight at Last Visit: 209 lb Weight Lost Since Last Visit: 3 lb Weight Gained Since Last Visit: 0 Starting Weight: 241 lb Total Weight Loss (lbs): 35 lb (15.9 kg)   Body Composition  Body Fat %: 41.8 % Fat Mass (lbs): 86.2 lbs Muscle Mass (lbs): 113.8 lbs Total Body Water (lbs): 78.8 lbs Visceral Fat Rating : 10   Other Clinical Data Fasting: Yes Labs: Yes Today's Visit #: 79 Starting Date: 09/28/16    Chief Complaint: OBESITY   Discussed the use of AI scribe software for clinical note transcription with the patient, who gave verbal consent to proceed.  History of Present Illness   The patient, with a history of obesity, diabetes, and hypertension, presents for a follow-up visit. She reports a recent weight loss of three pounds over the past month and has been maintaining a food journal intermittently. She has recently recovered from a COVID-19 infection, which was characterized by loss of taste and smell, fatigue, and sinus congestion. She did not experience any fever during the illness. The recovery period was approximately two weeks, with lingering fatigue and sinus issues. The patient's taste has started to return in the past four to five days, but she reports a decreased appetite.  The patient also reports chronic constipation, requiring the use of Dulcolax to maintain bowel regularity. She has been on a regimen of bupropion and Celexa, the latter for panic attacks. She has not been taking metformin recently. She has been prescribed Mounjaro for weight loss, which she reports has been more effective than previous medications. She has a goal to reduce her weight to under 200 pounds.          PHYSICAL EXAM:  Blood pressure 138/81, pulse 72, temperature 98.5 F (36.9 C), height 5\' 8"   (1.727 m), weight 206 lb (93.4 kg), SpO2 99%. Body mass index is 31.32 kg/m.  DIAGNOSTIC DATA REVIEWED:  BMET    Component Value Date/Time   NA 142 08/18/2022 1103   K 4.5 08/18/2022 1103   CL 109 (H) 08/18/2022 1103   CO2 20 08/18/2022 1103   GLUCOSE 79 08/18/2022 1103   GLUCOSE 80 04/28/2020 1444   BUN 24 08/18/2022 1103   CREATININE 0.93 08/18/2022 1103   CALCIUM 9.5 08/18/2022 1103   GFRNONAA 109 11/11/2019 1049   GFRAA 125 11/11/2019 1049   Lab Results  Component Value Date   HGBA1C 5.0 08/18/2022   HGBA1C 6.1 09/22/2010   Lab Results  Component Value Date   INSULIN 17.0 08/18/2022   INSULIN 14.7 09/28/2016   Lab Results  Component Value Date   TSH 2.270 12/09/2021   CBC    Component Value Date/Time   WBC 6.6 12/09/2021 1442   WBC 13.3 (H) 04/28/2020 1444   RBC 4.27 12/09/2021 1442   RBC 4.99 04/28/2020 1444   HGB 12.1 12/09/2021 1442   HCT 37.1 12/09/2021 1442   PLT 276 12/09/2021 1442   MCV 87 12/09/2021 1442   MCH 28.3 12/09/2021 1442   MCH 27.0 04/19/2015 0650   MCHC 32.6 12/09/2021 1442   MCHC 32.1 04/28/2020 1444   RDW 13.4 12/09/2021 1442   Iron Studies No results found for: "IRON", "TIBC", "FERRITIN", "IRONPCTSAT" Lipid Panel     Component Value Date/Time   CHOL 152 08/18/2022 1103   TRIG 88 08/18/2022 1103  HDL 56 08/18/2022 1103   CHOLHDL 3 08/16/2016 1011   VLDL 12.2 08/16/2016 1011   LDLCALC 79 08/18/2022 1103   Hepatic Function Panel     Component Value Date/Time   PROT 6.9 08/18/2022 1103   ALBUMIN 4.5 08/18/2022 1103   AST 23 08/18/2022 1103   ALT 16 08/18/2022 1103   ALKPHOS 104 08/18/2022 1103   BILITOT 0.3 08/18/2022 1103   BILIDIR 0.1 06/12/2013 1632   IBILI 0.2 06/11/2009 2309      Component Value Date/Time   TSH 2.270 12/09/2021 1442   Nutritional Lab Results  Component Value Date   VD25OH 52.7 08/18/2022   VD25OH 28.4 (L) 04/21/2022   VD25OH 46.6 12/09/2021     Assessment and Plan    Obesity Weight  loss of 3 pounds since last visit. Patient has been keeping a food journal intermittently and has been recovering from COVID-19. -Continue current weight loss strategies and monitor progress. 1200 kcal and 85+ gm of protein. Exercise as tolerated while recovering from Covid and keep working on increasing protein.   Emotional Eating Behaviors Refill Wellbutrin and work on getting back with her eating plan.   Type 2 Diabetes Patient is on Mounjaro and has stopped taking Metformin. -Resume Metformin once daily. -Check A1C, B12, and other relevant labs today.   Vit D and B12 deficiency -check labs today and follow.   Follow-up in 6 weeks.       She was informed of the importance of frequent follow up visits to maximize her success with intensive lifestyle modifications for her multiple health conditions.    Quillian Quince, MD

## 2022-12-28 ENCOUNTER — Encounter: Payer: Self-pay | Admitting: Internal Medicine

## 2022-12-28 NOTE — Patient Instructions (Addendum)
Medications changes include :   none      Return in about 6 months (around 06/28/2023) for follow up.   Health Maintenance, Female Adopting a healthy lifestyle and getting preventive care are important in promoting health and wellness. Ask your health care provider about: The right schedule for you to have regular tests and exams. Things you can do on your own to prevent diseases and keep yourself healthy. What should I know about diet, weight, and exercise? Eat a healthy diet  Eat a diet that includes plenty of vegetables, fruits, low-fat dairy products, and lean protein. Do not eat a lot of foods that are high in solid fats, added sugars, or sodium. Maintain a healthy weight Body mass index (BMI) is used to identify weight problems. It estimates body fat based on height and weight. Your health care provider can help determine your BMI and help you achieve or maintain a healthy weight. Get regular exercise Get regular exercise. This is one of the most important things you can do for your health. Most adults should: Exercise for at least 150 minutes each week. The exercise should increase your heart rate and make you sweat (moderate-intensity exercise). Do strengthening exercises at least twice a week. This is in addition to the moderate-intensity exercise. Spend less time sitting. Even light physical activity can be beneficial. Watch cholesterol and blood lipids Have your blood tested for lipids and cholesterol at 53 years of age, then have this test every 5 years. Have your cholesterol levels checked more often if: Your lipid or cholesterol levels are high. You are older than 53 years of age. You are at high risk for heart disease. What should I know about cancer screening? Depending on your health history and family history, you may need to have cancer screening at various ages. This may include screening for: Breast cancer. Cervical cancer. Colorectal cancer. Skin  cancer. Lung cancer. What should I know about heart disease, diabetes, and high blood pressure? Blood pressure and heart disease High blood pressure causes heart disease and increases the risk of stroke. This is more likely to develop in people who have high blood pressure readings or are overweight. Have your blood pressure checked: Every 3-5 years if you are 13-69 years of age. Every year if you are 35 years old or older. Diabetes Have regular diabetes screenings. This checks your fasting blood sugar level. Have the screening done: Once every three years after age 61 if you are at a normal weight and have a low risk for diabetes. More often and at a younger age if you are overweight or have a high risk for diabetes. What should I know about preventing infection? Hepatitis B If you have a higher risk for hepatitis B, you should be screened for this virus. Talk with your health care provider to find out if you are at risk for hepatitis B infection. Hepatitis C Testing is recommended for: Everyone born from [birth year] through 1966. Anyone with known risk factors for hepatitis C. Sexually transmitted infections (STIs) Get screened for STIs, including gonorrhea and chlamydia, if: You are sexually active and are younger than 53 years of age. You are older than 53 years of age and your health care provider tells you that you are at risk for this type of infection. Your sexual activity has changed since you were last screened, and you are at increased risk for chlamydia or gonorrhea. Ask your health care provider if you are  at risk. Ask your health care provider about whether you are at high risk for HIV. Your health care provider may recommend a prescription medicine to help prevent HIV infection. If you choose to take medicine to prevent HIV, you should first get tested for HIV. You should then be tested every 3 months for as long as you are taking the medicine. Pregnancy If you are about to stop  having your period (premenopausal) and you may become pregnant, seek counseling before you get pregnant. Take 400 to 800 micrograms (mcg) of folic acid every day if you become pregnant. Ask for birth control (contraception) if you want to prevent pregnancy. Osteoporosis and menopause Osteoporosis is a disease in which the bones lose minerals and strength with aging. This can result in bone fractures. If you are 53 years old or older, or if you are at risk for osteoporosis and fractures, ask your health care provider if you should: Be screened for bone loss. Take a calcium or vitamin D supplement to lower your risk of fractures. Be given hormone replacement therapy (HRT) to treat symptoms of menopause. Follow these instructions at home: Alcohol use Do not drink alcohol if: Your health care provider tells you not to drink. You are pregnant, may be pregnant, or are planning to become pregnant. If you drink alcohol: Limit how much you have to: 0-1 drink a day. Know how much alcohol is in your drink. In the [place] one drink equals one 12 oz bottle of beer (355 mL), one 5 oz glass of wine (148 mL), or one 1 oz glass of hard liquor (44 mL). Lifestyle Do not use any products that contain nicotine or tobacco. These products include cigarettes, chewing tobacco, and vaping devices, such as e-cigarettes. If you need help quitting, ask your health care provider. Do not use street drugs. Do not share needles. Ask your health care provider for help if you need support or information about quitting drugs. General instructions Schedule regular health, dental, and eye exams. Stay current with your vaccines. Tell your health care provider if: You often feel depressed. You have ever been abused or do not feel safe at home. Summary Adopting a healthy lifestyle and getting preventive care are important in promoting health and wellness. Follow your health care provider's instructions about healthy diet,  exercising, and getting tested or screened for diseases. Follow your health care provider's instructions on monitoring your cholesterol and blood pressure. This information is not intended to replace advice given to you by your health care provider. Make sure you discuss any questions you have with your health care provider. Document Revised: 08/10/2020 Document Reviewed: 08/10/2020 Elsevier Patient Education  2024 [employer].

## 2022-12-28 NOTE — Progress Notes (Unsigned)
Subjective:    Patient ID: [name], female    DOB: [birth year], 52 y.o.   MRN: [id number]      HPI [name] is here for a Physical exam and her chronic medical problems.   Labs not needed   Medications and allergies reviewed with patient and updated if appropriate.  Current Outpatient Medications on File Prior to Visit  Medication Sig Dispense Refill   acetaminophen (TYLENOL) 650 MG CR tablet Take 1,300 mg by mouth every 8 (eight) hours as needed for pain.     albuterol (VENTOLIN HFA) 108 (90 Base) MCG/ACT inhaler Inhale 2 puffs into the lungs every 6 (six) hours as needed for wheezing or shortness of breath. TAKE 2 PUFFS BY MOUTH EVERY 6 HOURS AS NEEDED FOR WHEEZE OR SHORTNESS OF BREATH 8.5 g 5   aspirin 81 MG chewable tablet Chew 1 tablet (81 mg total) by mouth 2 (two) times daily. 60 tablet 0   buPROPion (WELLBUTRIN SR) 150 MG 12 hr tablet Take 1 tablet (150 mg total) by mouth daily. 90 tablet 0   celecoxib (CELEBREX) 200 MG capsule Take 200 mg by mouth daily.     citalopram (CELEXA) 20 MG tablet TAKE 1 TABLET BY MOUTH EVERY DAY 90 tablet 1   cyclobenzaprine (FLEXERIL) 10 MG tablet Take 10 mg by mouth 2 (two) times daily as needed.     diphenhydramine-acetaminophen (TYLENOL PM) 25-500 MG TABS tablet Take 2 tablets by mouth at bedtime.     famotidine (PEPCID) 20 MG tablet TAKE 1 TABLET BY MOUTH EVERYDAY AT BEDTIME 90 tablet 1   fexofenadine (ALLEGRA) 180 MG tablet Take 180 mg by mouth daily.     hydroxychloroquine (PLAQUENIL) 200 MG tablet Take 200 mg by mouth 2 (two) times daily.     linaclotide (LINZESS) 145 MCG CAPS capsule Take 1 capsule (145 mcg total) by mouth daily before breakfast. 90 capsule 1   lisinopril (ZESTRIL) 20 MG tablet Take 1 tablet (20 mg total) by mouth daily. 90 tablet 1   Melatonin 5 MG CHEW Chew 1 tablet by mouth as needed.     metFORMIN (GLUCOPHAGE) 500 MG tablet Take 1 tablet (500 mg total) by mouth 2 (two) times daily with a meal. 180 tablet 0    OneTouch Delica Lancets 30G MISC 1 each by Does not apply route 2 (two) times daily. 100 each 0   SKYRIZI PEN 150 MG/ML SOAJ Inject into the skin.     tirzepatide (MOUNJARO) 15 MG/0.5ML Pen Inject 15 mg into the skin once a week. 6 mL 0   Vitamin D, Ergocalciferol, (DRISDOL) 1.25 MG (50000 UNIT) CAPS capsule Take 1 capsule (50,000 Units total) by mouth every 7 (seven) days. 4 capsule 0   No current facility-administered medications on file prior to visit.    Review of Systems     Objective:  There were no vitals filed for this visit. There were no vitals filed for this visit. There is no height or weight on file to calculate BMI.  BP Readings from Last 3 Encounters:  12/20/22 138/81  11/15/22 128/79  10/18/22 125/75    Wt Readings from Last 3 Encounters:  12/20/22 206 lb (93.4 kg)  11/15/22 209 lb (94.8 kg)  10/18/22 213 lb (96.6 kg)       Physical Exam Constitutional: [name]. No distress.  HENT:  Head: Normocephalic and atraumatic.  Right Ear: External ear normal. Normal ear canal and TM Left Ear: External ear  normal.  Normal ear canal and TM Mouth/Throat: Oropharynx is clear and moist.  Eyes: Conjunctivae normal.  Neck: Neck supple. No tracheal deviation present. No thyromegaly present.  No carotid bruit  Cardiovascular: Normal rate, regular rhythm and normal heart sounds.   No murmur heard.  No edema. Pulmonary/Chest: Effort normal and breath sounds normal. No respiratory distress. She has no wheezes. She has no rales.  Breast: deferred   Abdominal: Soft. She exhibits no distension. There is no tenderness.  Lymphadenopathy: She has no cervical adenopathy.  Skin: Skin is warm and dry. She is not diaphoretic.  Psychiatric: She has a normal mood and affect. Her behavior is normal.     Lab Results  Component Value Date   WBC 6.6 12/09/2021   HGB 12.1 12/09/2021   HCT 37.1 12/09/2021   PLT 276 12/09/2021   GLUCOSE 73  12/20/2022   CHOL 155 12/20/2022   TRIG 78 12/20/2022   HDL 66 12/20/2022   LDLCALC 74 12/20/2022   ALT 21 12/20/2022   AST 21 12/20/2022   NA 142 12/20/2022   K 4.0 12/20/2022   CL 106 12/20/2022   CREATININE 0.90 12/20/2022   BUN 19 12/20/2022   CO2 21 12/20/2022   TSH 2.270 12/09/2021   HGBA1C 5.3 12/20/2022         Assessment & Plan:   Physical exam: Screening blood work  ordered Exercise   Weight   Substance abuse  none   Reviewed recommended immunizations.   Health Maintenance  Topic Date Due   FOOT EXAM  Never done   OPHTHALMOLOGY EXAM  Never done   Hepatitis C Screening  Never done   Zoster Vaccines- Shingrix (2 of 2) 10/18/2020   DTaP/Tdap/Td (2 - Td or Tdap) 08/15/2022   INFLUENZA VACCINE  11/03/2022   COVID-19 Vaccine (4 - 2023-24 season) 12/04/2022   Medicare Annual Wellness (AWV)  02/03/2023   HEMOGLOBIN A1C  06/19/2023   Diabetic kidney evaluation - Urine ACR  08/18/2023   Diabetic kidney evaluation - eGFR measurement  12/20/2023   MAMMOGRAM  09/14/2024   Colonoscopy  07/14/2029   HIV Screening  Completed   HPV VACCINES  Aged Out          See Problem List for Assessment and Plan of chronic medical problems.

## 2022-12-29 ENCOUNTER — Ambulatory Visit (INDEPENDENT_AMBULATORY_CARE_PROVIDER_SITE_OTHER): Payer: PPO | Admitting: Internal Medicine

## 2022-12-29 VITALS — BP 120/80 | HR 80 | Temp 98.6°F | Ht 68.0 in | Wt 206.0 lb

## 2022-12-29 DIAGNOSIS — F3289 Other specified depressive episodes: Secondary | ICD-10-CM

## 2022-12-29 DIAGNOSIS — E782 Mixed hyperlipidemia: Secondary | ICD-10-CM | POA: Diagnosis not present

## 2022-12-29 DIAGNOSIS — J4520 Mild intermittent asthma, uncomplicated: Secondary | ICD-10-CM

## 2022-12-29 DIAGNOSIS — I10 Essential (primary) hypertension: Secondary | ICD-10-CM | POA: Diagnosis not present

## 2022-12-29 DIAGNOSIS — Z7984 Long term (current) use of oral hypoglycemic drugs: Secondary | ICD-10-CM

## 2022-12-29 DIAGNOSIS — K219 Gastro-esophageal reflux disease without esophagitis: Secondary | ICD-10-CM

## 2022-12-29 DIAGNOSIS — K5909 Other constipation: Secondary | ICD-10-CM | POA: Diagnosis not present

## 2022-12-29 DIAGNOSIS — Z0000 Encounter for general adult medical examination without abnormal findings: Secondary | ICD-10-CM | POA: Diagnosis not present

## 2022-12-29 DIAGNOSIS — E1169 Type 2 diabetes mellitus with other specified complication: Secondary | ICD-10-CM | POA: Diagnosis not present

## 2022-12-29 DIAGNOSIS — Z7985 Long-term (current) use of injectable non-insulin antidiabetic drugs: Secondary | ICD-10-CM

## 2022-12-29 DIAGNOSIS — L4050 Arthropathic psoriasis, unspecified: Secondary | ICD-10-CM

## 2022-12-29 DIAGNOSIS — F419 Anxiety disorder, unspecified: Secondary | ICD-10-CM

## 2022-12-29 NOTE — Assessment & Plan Note (Signed)
Chronic Following with [facility] On hydroxychloroquine 200 mg twice daily, Skyrizi, Celebrex Controlled

## 2022-12-29 NOTE — Assessment & Plan Note (Deleted)
Eye - dr Franchot [place] - [place] - [facility]

## 2022-12-29 NOTE — Assessment & Plan Note (Signed)
Chronic GERD controlled Continue Pepcid 20 mg nightly

## 2022-12-29 NOTE — Assessment & Plan Note (Signed)
Chronic Controlled, Stable Continue Celexa 20 mg daily, welbutrin sr 150 mg daily

## 2022-12-29 NOTE — Assessment & Plan Note (Signed)
Chronic Worse with mounjaro Taking a stool softener daily - worse, but get some looser stool - can try a different one Linzess not effective

## 2022-12-29 NOTE — Assessment & Plan Note (Signed)
Chronic LDL well controlled Continue lifestyle control Regular exercise and healthy diet encouraged

## 2022-12-29 NOTE — Assessment & Plan Note (Signed)
Chronic Controlled, Stable - denies depression Continue Celexa 20 mg daily

## 2022-12-29 NOTE — Assessment & Plan Note (Signed)
Chronic  Lab Results  Component Value Date   HGBA1C 5.3 12/20/2022   Sugars controlled Continue mounjaro 15 mg weekly, metformin 500 mg bid Stressed regular exercise, diabetic diet

## 2022-12-29 NOTE — Assessment & Plan Note (Signed)
Chronic Blood pressure well controlled Continue lisinopril 20 mg daily

## 2022-12-29 NOTE — Assessment & Plan Note (Signed)
Chronic Mild, intermediate Controlled Albuterol inhaler as needed

## 2023-01-24 ENCOUNTER — Other Ambulatory Visit (INDEPENDENT_AMBULATORY_CARE_PROVIDER_SITE_OTHER): Payer: Self-pay | Admitting: Family Medicine

## 2023-01-24 ENCOUNTER — Encounter: Payer: Self-pay | Admitting: Internal Medicine

## 2023-01-24 DIAGNOSIS — E559 Vitamin D deficiency, unspecified: Secondary | ICD-10-CM

## 2023-01-25 MED ORDER — HYDROCHLOROTHIAZIDE 25 MG PO TABS
25.0000 mg | ORAL_TABLET | Freq: Every day | ORAL | 5 refills | Status: DC
Start: 2023-01-25 — End: 2023-03-10

## 2023-02-07 DIAGNOSIS — Z6836 Body mass index (BMI) 36.0-36.9, adult: Secondary | ICD-10-CM | POA: Diagnosis not present

## 2023-02-07 DIAGNOSIS — Z7960 Long term (current) use of unspecified immunomodulators and immunosuppressants: Secondary | ICD-10-CM | POA: Diagnosis not present

## 2023-02-07 DIAGNOSIS — L409 Psoriasis, unspecified: Secondary | ICD-10-CM | POA: Diagnosis not present

## 2023-02-07 DIAGNOSIS — L4050 Arthropathic psoriasis, unspecified: Secondary | ICD-10-CM | POA: Diagnosis not present

## 2023-02-07 DIAGNOSIS — E66812 Obesity, class 2: Secondary | ICD-10-CM | POA: Diagnosis not present

## 2023-02-07 DIAGNOSIS — M791 Myalgia, unspecified site: Secondary | ICD-10-CM | POA: Diagnosis not present

## 2023-02-07 DIAGNOSIS — M150 Primary generalized (osteo)arthritis: Secondary | ICD-10-CM | POA: Diagnosis not present

## 2023-02-21 ENCOUNTER — Encounter (INDEPENDENT_AMBULATORY_CARE_PROVIDER_SITE_OTHER): Payer: Self-pay | Admitting: Family Medicine

## 2023-02-21 ENCOUNTER — Ambulatory Visit (INDEPENDENT_AMBULATORY_CARE_PROVIDER_SITE_OTHER): Payer: PPO | Admitting: Family Medicine

## 2023-02-21 VITALS — BP 127/79 | HR 81 | Temp 99.0°F | Ht 68.0 in | Wt 209.0 lb

## 2023-02-21 DIAGNOSIS — Z7984 Long term (current) use of oral hypoglycemic drugs: Secondary | ICD-10-CM

## 2023-02-21 DIAGNOSIS — Z6831 Body mass index (BMI) 31.0-31.9, adult: Secondary | ICD-10-CM

## 2023-02-21 DIAGNOSIS — E559 Vitamin D deficiency, unspecified: Secondary | ICD-10-CM

## 2023-02-21 DIAGNOSIS — E1169 Type 2 diabetes mellitus with other specified complication: Secondary | ICD-10-CM | POA: Diagnosis not present

## 2023-02-21 DIAGNOSIS — E669 Obesity, unspecified: Secondary | ICD-10-CM | POA: Diagnosis not present

## 2023-02-21 DIAGNOSIS — Z7985 Long-term (current) use of injectable non-insulin antidiabetic drugs: Secondary | ICD-10-CM

## 2023-02-21 MED ORDER — VITAMIN D (ERGOCALCIFEROL) 1.25 MG (50000 UNIT) PO CAPS: 50000.0000 [IU] | ORAL_CAPSULE | ORAL | 0 refills | Status: DC

## 2023-02-21 MED ORDER — TIRZEPATIDE 15 MG/0.5ML ~~LOC~~ SOAJ: 15.0000 mg | SUBCUTANEOUS | 0 refills | Status: DC

## 2023-02-21 NOTE — Progress Notes (Signed)
.smr  Office: [phone]  /  Fax: [phone]  WEIGHT SUMMARY AND BIOMETRICS  Anthropometric Measurements Height: 5\' 8"  (1.727 m) Weight: 209 lb (94.8 kg) BMI (Calculated): 31.79 Weight at Last Visit: 206 lb Weight Lost Since Last Visit: 0 Weight Gained Since Last Visit: 3 lb Starting Weight: 241 lb Total Weight Loss (lbs): 32 lb (14.5 kg)   Body Composition  Body Fat %: 41.6 % Fat Mass (lbs): 87 lbs Muscle Mass (lbs): 116 lbs Total Body Water (lbs): 80 lbs Visceral Fat Rating : 10   Other Clinical Data Fasting: No Labs: No Today's Visit #: 63 Starting Date: 09/28/16    Chief Complaint: OBESITY  History of Present Illness   The patient presents today to discuss her vitamin D deficiency, type two diabetes, and obesity. She reports a weight gain of three pounds over the last two months. She admits to not maintaining a regular exercise routine or food journaling recently. She is currently on Mounjaro 15mg  for diabetes and Metformin, and she requests a refill. She also takes prescription Vitamin D (50,000 units per week) for her vitamin D deficiency and requests a refill for this as well.  The patient also discusses her caregiving responsibilities for her parents, which have been causing her significant stress. She reports that her father has been losing weight rapidly and has been diagnosed with prolapsed hemorrhoids, which will require surgery. Her father also has pulmonary fibrosis and diabetes, and his appetite has been poor since he had COVID four years ago. The patient's mother is generally healthy, except for hearing loss.  The patient has been experiencing groin pain for over a year, which she plans to have checked by an orthopedic doctor. She also reports a sensation like a carpet burn in her neck and ear, which she attributes to a nerve issue related to a neck surgery she had in 2016.  Despite these health concerns and stressors, the patient reports some positive  changes in her health. She has been able to fit into size 12 pants for the first time in 20 years, and her blood work results have been generally good. She is currently on a medication regimen that includes a shot for her diabetes, which she alternates between sides each week. She has noticed some pain in the area where she injects the medication, which she is unsure if it is related to the shot or her hip issue.          PHYSICAL EXAM:  Blood pressure 127/79, pulse 81, temperature 99 F (37.2 C), height 5\' 8"  (1.727 m), weight 209 lb (94.8 kg), SpO2 99%. Body mass index is 31.78 kg/m.  DIAGNOSTIC DATA REVIEWED:  BMET    Component Value Date/Time   NA 142 12/20/2022 1125   K 4.0 12/20/2022 1125   CL 106 12/20/2022 1125   CO2 21 12/20/2022 1125   GLUCOSE 73 12/20/2022 1125   GLUCOSE 80 04/28/2020 1444   BUN 19 12/20/2022 1125   CREATININE 0.90 12/20/2022 1125   CALCIUM 8.9 12/20/2022 1125   GFRNONAA 109 11/11/2019 1049   GFRAA 125 11/11/2019 1049   Lab Results  Component Value Date   HGBA1C 5.3 12/20/2022   HGBA1C 6.1 09/22/2010   Lab Results  Component Value Date   INSULIN 12.7 12/20/2022   INSULIN 14.7 09/28/2016   Lab Results  Component Value Date   TSH 2.270 12/09/2021   CBC    Component Value Date/Time   WBC 6.6 12/09/2021 1442   WBC 13.3 (  H) 04/28/2020 1444   RBC 4.27 12/09/2021 1442   RBC 4.99 04/28/2020 1444   HGB 12.1 12/09/2021 1442   HCT 37.1 12/09/2021 1442   PLT 276 12/09/2021 1442   MCV 87 12/09/2021 1442   MCH 28.3 12/09/2021 1442   MCH 27.0 04/19/2015 0650   MCHC 32.6 12/09/2021 1442   MCHC 32.1 04/28/2020 1444   RDW 13.4 12/09/2021 1442   Iron Studies No results found for: "IRON", "TIBC", "FERRITIN", "IRONPCTSAT" Lipid Panel     Component Value Date/Time   CHOL 155 12/20/2022 1125   TRIG 78 12/20/2022 1125   HDL 66 12/20/2022 1125   CHOLHDL 3 08/16/2016 1011   VLDL 12.2 08/16/2016 1011   LDLCALC 74 12/20/2022 1125   Hepatic  Function Panel     Component Value Date/Time   PROT 6.6 12/20/2022 1125   ALBUMIN 4.2 12/20/2022 1125   AST 21 12/20/2022 1125   ALT 21 12/20/2022 1125   ALKPHOS 97 12/20/2022 1125   BILITOT 0.5 12/20/2022 1125   BILIDIR 0.1 06/12/2013 1632   IBILI 0.2 06/11/2009 2309      Component Value Date/Time   TSH 2.270 12/09/2021 1442   Nutritional Lab Results  Component Value Date   VD25OH 64.8 12/20/2022   VD25OH 52.7 08/18/2022   VD25OH 28.4 (L) 04/21/2022     Assessment and Plan    Type 2 Diabetes Mellitus Type 2 diabetes mellitus managed with Mounjaro 15 mg and metformin. Recent A1c is 5.3, indicating good control. Insulin levels have improved. Discussed the importance of maintaining current medication regimen. - Refill Mounjaro 15 mg - Continue metformin  Obesity Obesity with recent weight gain of 3 pounds over the last two months. Despite weight gain, muscle mass has increased and blood work shows improvement in cholesterol and insulin levels. Discussed the importance of regular exercise and journaling. - Encourage regular exercise and journaling - Follow up in 4 weeks  Vitamin D Deficiency Chronic vitamin D deficiency managed with prescription vitamin D. Recent vitamin D level is 64, indicating good control. - Refill prescription for vitamin D 50,000 units per week  General Health Maintenance Overall health is well-managed with good control of diabetes and improved cholesterol levels. Vitamin D and B12 levels are within normal range. Recent blood work shows good kidney and liver function. - Continue current medications - Follow up in 8 weeks for general health review  Follow-up - Schedule follow-up appointment in 4 weeks - Schedule follow-up appointment in 8 weeks.       She was informed of the importance of frequent follow up visits to maximize her success with intensive lifestyle modifications for her multiple health conditions.    Quillian Quince, MD

## 2023-02-22 ENCOUNTER — Encounter: Payer: Self-pay | Admitting: Internal Medicine

## 2023-02-22 NOTE — Progress Notes (Unsigned)
Subjective:    Patient ID: [name], female    DOB: [birth year], 52 y.o.   MRN: [id number]      HPI Adaleia is here for No chief complaint on file.   She is here for an acute visit for cold symptoms.   Her symptoms started   She is experiencing   She has tried taking       Medications and allergies reviewed with patient and updated if appropriate.  Current Outpatient Medications on File Prior to Visit  Medication Sig Dispense Refill   acetaminophen (TYLENOL) 650 MG CR tablet Take 1,300 mg by mouth every 8 (eight) hours as needed for pain.     albuterol (VENTOLIN HFA) 108 (90 Base) MCG/ACT inhaler Inhale 2 puffs into the lungs every 6 (six) hours as needed for wheezing or shortness of breath. TAKE 2 PUFFS BY MOUTH EVERY 6 HOURS AS NEEDED FOR WHEEZE OR SHORTNESS OF BREATH 8.5 g 5   aspirin 81 MG chewable tablet Chew 1 tablet (81 mg total) by mouth 2 (two) times daily. 60 tablet 0   buPROPion (WELLBUTRIN SR) 150 MG 12 hr tablet Take 1 tablet (150 mg total) by mouth daily. 90 tablet 0   celecoxib (CELEBREX) 200 MG capsule Take 200 mg by mouth daily.     citalopram (CELEXA) 20 MG tablet TAKE 1 TABLET BY MOUTH EVERY DAY 90 tablet 1   cyclobenzaprine (FLEXERIL) 10 MG tablet Take 10 mg by mouth 2 (two) times daily as needed.     diphenhydramine-acetaminophen (TYLENOL PM) 25-500 MG TABS tablet Take 2 tablets by mouth at bedtime.     famotidine (PEPCID) 20 MG tablet TAKE 1 TABLET BY MOUTH EVERYDAY AT BEDTIME 90 tablet 1   fexofenadine (ALLEGRA) 180 MG tablet Take 180 mg by mouth daily.     hydrochlorothiazide (HYDRODIURIL) 25 MG tablet Take 1 tablet (25 mg total) by mouth daily. 30 tablet 5   hydroxychloroquine (PLAQUENIL) 200 MG tablet Take 200 mg by mouth 2 (two) times daily.     lisinopril (ZESTRIL) 20 MG tablet Take 1 tablet (20 mg total) by mouth daily. 90 tablet 1   Melatonin 5 MG CHEW Chew 1 tablet by mouth as needed.     metFORMIN (GLUCOPHAGE) 500 MG tablet Take 1  tablet (500 mg total) by mouth 2 (two) times daily with a meal. 180 tablet 0   OneTouch Delica Lancets 30G MISC 1 each by Does not apply route 2 (two) times daily. 100 each 0   SKYRIZI PEN 150 MG/ML SOAJ Inject into the skin.     tirzepatide (MOUNJARO) 15 MG/0.5ML Pen Inject 15 mg into the skin once a week. 6 mL 0   Vitamin D, Ergocalciferol, (DRISDOL) 1.25 MG (50000 UNIT) CAPS capsule Take 1 capsule (50,000 Units total) by mouth every 7 (seven) days. 4 capsule 0   No current facility-administered medications on file prior to visit.    Review of Systems     Objective:  There were no vitals filed for this visit. BP Readings from Last 3 Encounters:  02/21/23 127/79  12/29/22 120/80  12/20/22 138/81   Wt Readings from Last 3 Encounters:  02/21/23 209 lb (94.8 kg)  12/29/22 206 lb (93.4 kg)  12/20/22 206 lb (93.4 kg)   There is no height or weight on file to calculate BMI.    Physical Exam Constitutional:      General: She is not in acute distress.    Appearance: Normal appearance. She  is not ill-appearing.  HENT:     Head: Normocephalic and atraumatic.     Right Ear: Tympanic membrane, ear canal and external ear normal.     Left Ear: Tympanic membrane, ear canal and external ear normal.     Mouth/Throat:     Mouth: Mucous membranes are moist.     Pharynx: No oropharyngeal exudate or posterior oropharyngeal erythema.  Eyes:     Conjunctiva/sclera: Conjunctivae normal.  Cardiovascular:     Rate and Rhythm: Normal rate and regular rhythm.  Pulmonary:     Effort: Pulmonary effort is normal. No respiratory distress.     Breath sounds: Normal breath sounds. No wheezing or rales.  Musculoskeletal:     Cervical back: Neck supple. No tenderness.  Lymphadenopathy:     Cervical: No cervical adenopathy.  Skin:    General: Skin is warm and dry.  Neurological:     Mental Status: She is alert.            Assessment & Plan:    See Problem List for Assessment and Plan of  chronic medical problems.

## 2023-02-22 NOTE — Patient Instructions (Addendum)
        Medications changes include :   cefdinir twice daily, cough syrup, inhaler as needed       Return if symptoms worsen or fail to improve.

## 2023-02-23 ENCOUNTER — Ambulatory Visit (INDEPENDENT_AMBULATORY_CARE_PROVIDER_SITE_OTHER): Payer: PPO | Admitting: Internal Medicine

## 2023-02-23 VITALS — BP 130/76 | HR 90 | Temp 98.8°F | Ht 68.0 in | Wt 211.0 lb

## 2023-02-23 DIAGNOSIS — J0100 Acute maxillary sinusitis, unspecified: Secondary | ICD-10-CM | POA: Diagnosis not present

## 2023-02-23 DIAGNOSIS — I10 Essential (primary) hypertension: Secondary | ICD-10-CM

## 2023-02-23 DIAGNOSIS — J4520 Mild intermittent asthma, uncomplicated: Secondary | ICD-10-CM

## 2023-02-23 MED ORDER — HYDROCODONE BIT-HOMATROP MBR 5-1.5 MG/5ML PO SOLN: 5.0000 mL | Freq: Three times a day (TID) | ORAL | 0 refills | Status: DC | PRN

## 2023-02-23 MED ORDER — ALBUTEROL SULFATE HFA 108 (90 BASE) MCG/ACT IN AERS: 2.0000 | INHALATION_SPRAY | Freq: Four times a day (QID) | RESPIRATORY_TRACT | 5 refills | Status: DC | PRN

## 2023-02-23 MED ORDER — CEFDINIR 300 MG PO CAPS: 300.0000 mg | ORAL_CAPSULE | Freq: Two times a day (BID) | ORAL | 0 refills | Status: AC

## 2023-02-23 NOTE — Assessment & Plan Note (Signed)
Acute Concern for bacterial cause Start cefdinir 300 mg twice daily x 7 days Albuterol inhaler, Hycodan cough syrup otc cold medications, allergy medications Rest, fluid Call if no improvement

## 2023-02-23 NOTE — Assessment & Plan Note (Signed)
Chronic Mild, intermittent No significant asthma exacerbation with current cold/sinus infection Albuterol inhaler as needed

## 2023-02-23 NOTE — Assessment & Plan Note (Signed)
Chronic Blood pressure well controlled Continue lisinopril 20 mg daily

## 2023-02-24 ENCOUNTER — Telehealth: Payer: Self-pay | Admitting: Internal Medicine

## 2023-02-24 DIAGNOSIS — M5412 Radiculopathy, cervical region: Secondary | ICD-10-CM | POA: Diagnosis not present

## 2023-02-24 DIAGNOSIS — M5416 Radiculopathy, lumbar region: Secondary | ICD-10-CM | POA: Diagnosis not present

## 2023-02-24 NOTE — Telephone Encounter (Signed)
Pt called back returning your call

## 2023-03-06 DIAGNOSIS — M7062 Trochanteric bursitis, left hip: Secondary | ICD-10-CM | POA: Diagnosis not present

## 2023-03-06 DIAGNOSIS — M19112 Post-traumatic osteoarthritis, left shoulder: Secondary | ICD-10-CM | POA: Diagnosis not present

## 2023-03-06 DIAGNOSIS — M7542 Impingement syndrome of left shoulder: Secondary | ICD-10-CM | POA: Diagnosis not present

## 2023-03-10 ENCOUNTER — Other Ambulatory Visit: Payer: Self-pay

## 2023-03-10 MED ORDER — HYDROCHLOROTHIAZIDE 25 MG PO TABS: 25.0000 mg | ORAL_TABLET | Freq: Every day | ORAL | 1 refills | Status: DC

## 2023-03-18 DIAGNOSIS — H5213 Myopia, bilateral: Secondary | ICD-10-CM | POA: Diagnosis not present

## 2023-03-18 DIAGNOSIS — Z79899 Other long term (current) drug therapy: Secondary | ICD-10-CM | POA: Diagnosis not present

## 2023-03-18 DIAGNOSIS — H52223 Regular astigmatism, bilateral: Secondary | ICD-10-CM | POA: Diagnosis not present

## 2023-03-22 DIAGNOSIS — M5412 Radiculopathy, cervical region: Secondary | ICD-10-CM | POA: Diagnosis not present

## 2023-03-24 DIAGNOSIS — M461 Sacroiliitis, not elsewhere classified: Secondary | ICD-10-CM | POA: Diagnosis not present

## 2023-03-28 ENCOUNTER — Ambulatory Visit (INDEPENDENT_AMBULATORY_CARE_PROVIDER_SITE_OTHER): Payer: PPO | Admitting: Family Medicine

## 2023-03-31 DIAGNOSIS — M064 Inflammatory polyarthropathy: Secondary | ICD-10-CM | POA: Insufficient documentation

## 2023-04-17 DIAGNOSIS — M25511 Pain in right shoulder: Secondary | ICD-10-CM | POA: Diagnosis not present

## 2023-04-17 DIAGNOSIS — M25512 Pain in left shoulder: Secondary | ICD-10-CM | POA: Diagnosis not present

## 2023-04-19 ENCOUNTER — Ambulatory Visit (INDEPENDENT_AMBULATORY_CARE_PROVIDER_SITE_OTHER): Payer: PPO | Admitting: Family Medicine

## 2023-04-19 ENCOUNTER — Encounter (INDEPENDENT_AMBULATORY_CARE_PROVIDER_SITE_OTHER): Payer: Self-pay | Admitting: Family Medicine

## 2023-04-19 VITALS — BP 115/67 | HR 84 | Temp 99.0°F | Ht 68.0 in | Wt 197.0 lb

## 2023-04-19 DIAGNOSIS — E669 Obesity, unspecified: Secondary | ICD-10-CM | POA: Diagnosis not present

## 2023-04-19 DIAGNOSIS — E119 Type 2 diabetes mellitus without complications: Secondary | ICD-10-CM | POA: Diagnosis not present

## 2023-04-19 DIAGNOSIS — K5909 Other constipation: Secondary | ICD-10-CM

## 2023-04-19 DIAGNOSIS — F5089 Other specified eating disorder: Secondary | ICD-10-CM | POA: Diagnosis not present

## 2023-04-19 DIAGNOSIS — E1169 Type 2 diabetes mellitus with other specified complication: Secondary | ICD-10-CM

## 2023-04-19 DIAGNOSIS — Z6829 Body mass index (BMI) 29.0-29.9, adult: Secondary | ICD-10-CM | POA: Diagnosis not present

## 2023-04-19 DIAGNOSIS — E559 Vitamin D deficiency, unspecified: Secondary | ICD-10-CM | POA: Diagnosis not present

## 2023-04-19 DIAGNOSIS — Z7984 Long term (current) use of oral hypoglycemic drugs: Secondary | ICD-10-CM | POA: Diagnosis not present

## 2023-04-19 DIAGNOSIS — Z7985 Long-term (current) use of injectable non-insulin antidiabetic drugs: Secondary | ICD-10-CM

## 2023-04-19 DIAGNOSIS — F3289 Other specified depressive episodes: Secondary | ICD-10-CM

## 2023-04-19 DIAGNOSIS — K5901 Slow transit constipation: Secondary | ICD-10-CM

## 2023-04-19 MED ORDER — TIRZEPATIDE 15 MG/0.5ML ~~LOC~~ SOAJ
15.0000 mg | SUBCUTANEOUS | 0 refills | Status: DC
Start: 2023-04-19 — End: 2023-06-14

## 2023-04-19 MED ORDER — VITAMIN D (ERGOCALCIFEROL) 1.25 MG (50000 UNIT) PO CAPS: 50000.0000 [IU] | ORAL_CAPSULE | ORAL | 0 refills | Status: DC

## 2023-04-19 MED ORDER — BUPROPION HCL ER (SR) 150 MG PO TB12: 150.0000 mg | ORAL_TABLET | Freq: Every day | ORAL | 0 refills | Status: DC

## 2023-04-19 NOTE — Progress Notes (Signed)
 .smr  Office: [phone]  /  Fax: [phone]  WEIGHT SUMMARY AND BIOMETRICS  Anthropometric Measurements Height: 5\' 8"  (1.727 m) Weight: 197 lb (89.4 kg) BMI (Calculated): 29.96 Weight at Last Visit: 209 lb Weight Lost Since Last Visit: 12 lb Weight Gained Since Last Visit: 0 Starting Weight: 241 lb Total Weight Loss (lbs): 44 lb (20 kg)   Body Composition  Body Fat %: 42.2 % Fat Mass (lbs): 83.4 lbs Muscle Mass (lbs): 108.4 lbs Total Body Water  (lbs): 76.6 lbs Visceral Fat Rating : 10   Other Clinical Data Fasting: Yes Labs: Yes Today's Visit #: 27 Starting Date: 09/28/16    Chief Complaint: OBESITY    History of Present Illness   The patient, with a history of type 2 diabetes, emotional eating behaviors, vitamin D  deficiency, and obesity, presents for a follow-up consultation. She is currently on Wellbutrin  for emotional eating behaviors, vitamin D  supplements for vitamin D  deficiency, and Mounjaro  and metformin  for type 2 diabetes. She has also been making dietary changes and exercising to improve blood sugar control.  The patient reports a significant improvement in her health, even over the holiday period. She attributes this improvement to dietary changes, including increased protein intake and a newfound preference for carrots. She also mentions a decrease in cravings for certain foods, particularly fried foods, and a decrease in her consumption of caffeinated beverages.  However, the patient reports ongoing issues with constipation, requiring the use of Dulcolax and stool softeners. She also mentions a recent upper respiratory infection, which has left her with a lingering cough. 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  Despite these challenges, the patient expresses satisfaction with her progress, noting a significant weight loss and a decrease in her BMI from 37 to under 30. She attributes this  success to her medication regimen and lifestyle changes. However, she expresses concern about maintaining this progress, particularly in relation to muscle loss and constipation.  The patient also mentions ongoing issues with her arthritis, for which she receives regular injections. She expresses frustration with her current pharmacy's service and the high cost of her medication. She also mentions a recent change in her living situation, which has added to her stress levels. Despite these challenges, the patient remains committed to her health and is actively seeking solutions to these issues.          PHYSICAL EXAM:  Blood pressure 115/67, pulse 84, temperature 99 F (37.2 C), height 5\' 8"  (1.727 m), weight 197 lb (89.4 kg), SpO2 100%. Body mass index is 29.95 kg/m.  DIAGNOSTIC DATA REVIEWED:  BMET    Component Value Date/Time   NA 142 12/20/2022 1125   K 4.0 12/20/2022 1125   CL 106 12/20/2022 1125   CO2 21 12/20/2022 1125   GLUCOSE 73 12/20/2022 1125   GLUCOSE 80 04/28/2020 1444   BUN 19 12/20/2022 1125   CREATININE 0.90 12/20/2022 1125   CALCIUM 8.9 12/20/2022 1125   GFRNONAA 109 11/11/2019 1049   GFRAA 125 11/11/2019 1049   Lab Results  Component Value Date   HGBA1C 5.3 12/20/2022   HGBA1C 6.1 09/22/2010   Lab Results  Component Value Date   INSULIN  12.7 12/20/2022   INSULIN  14.7 09/28/2016   Lab Results  Component Value Date   TSH 2.270 12/09/2021   CBC    Component Value Date/Time   WBC 6.6 12/09/2021 1442   WBC 13.3 (H) 04/28/2020 1444   RBC 4.27  12/09/2021 1442   RBC 4.99 04/28/2020 1444   HGB 12.1 12/09/2021 1442   HCT 37.1 12/09/2021 1442   PLT 276 12/09/2021 1442   MCV 87 12/09/2021 1442   MCH 28.3 12/09/2021 1442   MCH 27.0 04/19/2015 0650   MCHC 32.6 12/09/2021 1442   MCHC 32.1 04/28/2020 1444   RDW 13.4 12/09/2021 1442   Iron Studies No results found for: "IRON", "TIBC", "FERRITIN", "IRONPCTSAT" Lipid Panel     Component Value Date/Time    CHOL 155 12/20/2022 1125   TRIG 78 12/20/2022 1125   HDL 66 12/20/2022 1125   CHOLHDL 3 08/16/2016 1011   VLDL 12.2 08/16/2016 1011   LDLCALC 74 12/20/2022 1125   Hepatic Function Panel     Component Value Date/Time   PROT 6.6 12/20/2022 1125   ALBUMIN 4.2 12/20/2022 1125   AST 21 12/20/2022 1125   ALT 21 12/20/2022 1125   ALKPHOS 97 12/20/2022 1125   BILITOT 0.5 12/20/2022 1125   BILIDIR 0.1 06/12/2013 1632   IBILI 0.2 06/11/2009 2309      Component Value Date/Time   TSH 2.270 12/09/2021 1442   Nutritional Lab Results  Component Value Date   VD25OH 64.8 12/20/2022   VD25OH 52.7 08/18/2022   VD25OH 28.4 (L) 04/21/2022     Assessment and Plan    Type 2 Diabetes Mellitus Type 2 diabetes managed with Mounjaro  and metformin . Reports improved blood sugar control with current regimen and dietary changes. Discussed benefits of Mounjaro , including its GLP-1 and GIP components, which help with cravings and neurologic hunger. Current regimen is effective. - Refill Mounjaro  - Continue metformin  - Encourage continued dietary management and exercise  Obesity Obesity with significant weight loss noted. BMI reduced from 37 to 29. Reports improved dietary habits and increased physical activity. Emphasized need for protein intake and light strengthening exercises to prevent muscle loss. Addressed constipation. - Encourage protein intake - Recommend daily Miralax  for constipation - Suggest chair yoga or other light strengthening exercises - Continue use of vibrating machine for core muscle engagement    Constipation Chronic constipation with infrequent bowel movements. Currently using Dulcolax and stool softeners. Reports no hard, painful, or bleeding bowel movements. Discussed adding daily Miralax . - Add daily Miralax  to current regimen  Emotional Eating Emotional eating managed with Wellbutrin . Requests refill. - Refill Wellbutrin   Vitamin D  Deficiency Vitamin D  deficiency  managed with supplementation. Requests refill. - Refill vitamin D   General Health Maintenance Recent upper respiratory infection with lingering cough. No fever or body aches. Cough expected to improve gradually. Encouraged hydration. - Encourage hydration  Follow-up - Schedule follow-up in 6-8 weeks.        She was informed of the importance of frequent follow up visits to maximize her success with intensive lifestyle modifications for her multiple health conditions.    Jasmine Mesi, MD

## 2023-04-20 ENCOUNTER — Encounter (INDEPENDENT_AMBULATORY_CARE_PROVIDER_SITE_OTHER): Payer: Self-pay | Admitting: *Deleted

## 2023-04-20 LAB — HEMOGLOBIN A1C
Est. average glucose Bld gHb Est-mCnc: 111 mg/dL
Hgb A1c MFr Bld: 5.5 % (ref 4.8–5.6)

## 2023-04-20 LAB — LIPID PANEL WITH LDL/HDL RATIO
Cholesterol, Total: 186 mg/dL (ref 100–199)
HDL: 68 mg/dL (ref >39)
LDL Chol Calc (NIH): 103 mg/dL — ABNORMAL HIGH (ref 0–99)
LDL/HDL Ratio: 1.5 {ratio} (ref 0.0–3.2)
Triglycerides: 85 mg/dL (ref 0–149)
VLDL Cholesterol Cal: 15 mg/dL (ref 5–40)

## 2023-04-20 LAB — CMP14+EGFR
ALT: 14 [IU]/L (ref 0–32)
AST: 20 [IU]/L (ref 0–40)
Albumin: 4.2 g/dL (ref 3.8–4.9)
Alkaline Phosphatase: 83 [IU]/L (ref 44–121)
BUN/Creatinine Ratio: 20 (ref 9–23)
BUN: 19 mg/dL (ref 6–24)
Bilirubin Total: 0.3 mg/dL (ref 0.0–1.2)
CO2: 23 mmol/L (ref 20–29)
Calcium: 9.3 mg/dL (ref 8.7–10.2)
Chloride: 102 mmol/L (ref 96–106)
Creatinine, Ser: 0.96 mg/dL (ref 0.57–1.00)
Globulin, Total: 2.6 g/dL (ref 1.5–4.5)
Glucose: 71 mg/dL (ref 70–99)
Potassium: 3.7 mmol/L (ref 3.5–5.2)
Sodium: 143 mmol/L (ref 134–144)
Total Protein: 6.8 g/dL (ref 6.0–8.5)
eGFR: 71 mL/min/{1.73_m2} (ref >59)

## 2023-04-20 LAB — VITAMIN D 25 HYDROXY (VIT D DEFICIENCY, FRACTURES): Vit D, 25-Hydroxy: 50.9 ng/mL (ref 30.0–100.0)

## 2023-04-20 LAB — INSULIN, RANDOM: INSULIN: 19.3 u[IU]/mL (ref 2.6–24.9)

## 2023-04-20 LAB — VITAMIN B12: Vitamin B-12: 469 pg/mL (ref 232–1245)

## 2023-04-20 NOTE — Telephone Encounter (Signed)
PA SUBMITTED FOR MOUNJARO 15 MG   ynda Cieslak (Key: [id number])  Your information has been submitted to [facility]. [employer] will review the request and will issue a decision, typically within 3-7 days from your submission. You can check the updated outcome later by reopening this request.  If [employer] has not responded in 3-7 days or if you have any questions about your ePA request, please contact [employer] at [phone].

## 2023-05-01 NOTE — Telephone Encounter (Signed)
[name] (Key: [id number])  This request has been approved.  Please note any additional information provided by [facility] at the bottom of your screen.  Patient notified via Mychart message

## 2023-05-02 ENCOUNTER — Other Ambulatory Visit: Payer: Self-pay | Admitting: Internal Medicine

## 2023-05-03 ENCOUNTER — Encounter: Payer: Self-pay | Admitting: Internal Medicine

## 2023-05-03 MED ORDER — CITALOPRAM HYDROBROMIDE 40 MG PO TABS
40.0000 mg | ORAL_TABLET | Freq: Every day | ORAL | 1 refills | Status: DC
Start: 2023-05-03 — End: 2023-11-03

## 2023-05-29 DIAGNOSIS — M7541 Impingement syndrome of right shoulder: Secondary | ICD-10-CM | POA: Diagnosis not present

## 2023-05-29 DIAGNOSIS — M25512 Pain in left shoulder: Secondary | ICD-10-CM | POA: Diagnosis not present

## 2023-05-29 DIAGNOSIS — M7062 Trochanteric bursitis, left hip: Secondary | ICD-10-CM | POA: Diagnosis not present

## 2023-06-07 DIAGNOSIS — M25512 Pain in left shoulder: Secondary | ICD-10-CM | POA: Diagnosis not present

## 2023-06-09 ENCOUNTER — Other Ambulatory Visit: Payer: Self-pay | Admitting: Internal Medicine

## 2023-06-12 DIAGNOSIS — L409 Psoriasis, unspecified: Secondary | ICD-10-CM | POA: Diagnosis not present

## 2023-06-12 DIAGNOSIS — Z7960 Long term (current) use of unspecified immunomodulators and immunosuppressants: Secondary | ICD-10-CM | POA: Diagnosis not present

## 2023-06-12 DIAGNOSIS — M150 Primary generalized (osteo)arthritis: Secondary | ICD-10-CM | POA: Diagnosis not present

## 2023-06-12 DIAGNOSIS — L4050 Arthropathic psoriasis, unspecified: Secondary | ICD-10-CM | POA: Diagnosis not present

## 2023-06-12 DIAGNOSIS — M791 Myalgia, unspecified site: Secondary | ICD-10-CM | POA: Diagnosis not present

## 2023-06-12 DIAGNOSIS — M25512 Pain in left shoulder: Secondary | ICD-10-CM | POA: Diagnosis not present

## 2023-06-12 DIAGNOSIS — M25511 Pain in right shoulder: Secondary | ICD-10-CM | POA: Diagnosis not present

## 2023-06-14 ENCOUNTER — Encounter (INDEPENDENT_AMBULATORY_CARE_PROVIDER_SITE_OTHER): Payer: Self-pay | Admitting: Family Medicine

## 2023-06-14 ENCOUNTER — Ambulatory Visit (INDEPENDENT_AMBULATORY_CARE_PROVIDER_SITE_OTHER): Payer: HMO | Admitting: Family Medicine

## 2023-06-14 VITALS — BP 113/72 | HR 82 | Temp 98.5°F | Ht 68.0 in | Wt 195.0 lb

## 2023-06-14 DIAGNOSIS — R5383 Other fatigue: Secondary | ICD-10-CM | POA: Diagnosis not present

## 2023-06-14 DIAGNOSIS — E559 Vitamin D deficiency, unspecified: Secondary | ICD-10-CM

## 2023-06-14 DIAGNOSIS — Z7985 Long-term (current) use of injectable non-insulin antidiabetic drugs: Secondary | ICD-10-CM

## 2023-06-14 DIAGNOSIS — F3289 Other specified depressive episodes: Secondary | ICD-10-CM | POA: Diagnosis not present

## 2023-06-14 DIAGNOSIS — Z7984 Long term (current) use of oral hypoglycemic drugs: Secondary | ICD-10-CM | POA: Diagnosis not present

## 2023-06-14 DIAGNOSIS — M797 Fibromyalgia: Secondary | ICD-10-CM

## 2023-06-14 DIAGNOSIS — E119 Type 2 diabetes mellitus without complications: Secondary | ICD-10-CM | POA: Diagnosis not present

## 2023-06-14 DIAGNOSIS — E669 Obesity, unspecified: Secondary | ICD-10-CM

## 2023-06-14 DIAGNOSIS — Z6829 Body mass index (BMI) 29.0-29.9, adult: Secondary | ICD-10-CM

## 2023-06-14 DIAGNOSIS — E1169 Type 2 diabetes mellitus with other specified complication: Secondary | ICD-10-CM

## 2023-06-14 MED ORDER — TIRZEPATIDE 15 MG/0.5ML ~~LOC~~ SOAJ: 15.0000 mg | SUBCUTANEOUS | 0 refills | Status: DC

## 2023-06-14 MED ORDER — VITAMIN D (ERGOCALCIFEROL) 1.25 MG (50000 UNIT) PO CAPS: 50000.0000 [IU] | ORAL_CAPSULE | ORAL | 0 refills | Status: DC

## 2023-06-14 MED ORDER — BUPROPION HCL ER (SR) 150 MG PO TB12: 150.0000 mg | ORAL_TABLET | Freq: Every day | ORAL | 0 refills | Status: DC

## 2023-06-14 NOTE — Progress Notes (Signed)
 Office: [phone]  /  Fax: [phone]  WEIGHT SUMMARY AND BIOMETRICS  Anthropometric Measurements Height: 5\' 8"  (1.727 m) Weight: 195 lb (88.5 kg) BMI (Calculated): 29.66 Weight at Last Visit: 197 lb Weight Lost Since Last Visit: 2 lb Weight Gained Since Last Visit: 0 Starting Weight: 241 lb Total Weight Loss (lbs): 46 lb (20.9 kg)   Body Composition  Body Fat %: 41.4 % Fat Mass (lbs): 80.8 lbs Muscle Mass (lbs): 108.8 lbs Total Body Water (lbs): 77.6 lbs Visceral Fat Rating : 9   Other Clinical Data Today's Visit #: 52 Starting Date: 09/28/16    Chief Complaint: OBESITY    History of Present Illness   The patient, with obesity and type 2 diabetes, presents to discuss her obesity and progress with weight loss.  She has been working on journaling her nutritional goals and meets them successfully about 40% of the time. Her calorie goal is 1200 calories per day with 85 mg of protein. She has lost two pounds in the last two months since her last visit. She has not been exercising recently.  She has a history of type 2 diabetes and is being treated with metformin and Mounjaro. Her recent A1c is 5.5, indicating good glycemic control. She monitors her blood glucose levels regularly.  She reports high stress levels due to caregiving responsibilities for her father, who has been experiencing significant health issues. Her father, who also has diabetes, was recently hospitalized due to complications including high blood sugar levels, swelling in his legs and feet, and shortness of breath. He was put on oxygen and two types of insulin after being discharged. She has been actively involved in his care, ensuring he follows a low carb, low sugar, and low sodium diet. 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          PHYSICAL EXAM:  Blood pressure 113/72, pulse 82, temperature 98.5 F (36.9 C), height 5\' 8"  (1.727 m), weight 195 lb  (88.5 kg), SpO2 100%. Body mass index is 29.65 kg/m.  DIAGNOSTIC DATA REVIEWED:  BMET    Component Value Date/Time   NA 143 04/19/2023 1134   K 3.7 04/19/2023 1134   CL 102 04/19/2023 1134   CO2 23 04/19/2023 1134   GLUCOSE 71 04/19/2023 1134   GLUCOSE 80 04/28/2020 1444   BUN 19 04/19/2023 1134   CREATININE 0.96 04/19/2023 1134   CALCIUM 9.3 04/19/2023 1134   GFRNONAA 109 11/11/2019 1049   GFRAA 125 11/11/2019 1049   Lab Results  Component Value Date   HGBA1C 5.5 04/19/2023   HGBA1C 6.1 09/22/2010   Lab Results  Component Value Date   INSULIN 19.3 04/19/2023   INSULIN 14.7 09/28/2016   Lab Results  Component Value Date   TSH 2.270 12/09/2021   CBC    Component Value Date/Time   WBC 6.6 12/09/2021 1442   WBC 13.3 (H) 04/28/2020 1444   RBC 4.27 12/09/2021 1442   RBC 4.99 04/28/2020 1444   HGB 12.1 12/09/2021 1442   HCT 37.1 12/09/2021 1442   PLT 276 12/09/2021 1442   MCV 87 12/09/2021 1442   MCH 28.3 12/09/2021 1442   MCH 27.0 04/19/2015 0650   MCHC 32.6 12/09/2021 1442   MCHC 32.1 04/28/2020 1444   RDW 13.4 12/09/2021 1442   Iron Studies No results found for: "IRON", "TIBC", "FERRITIN", "IRONPCTSAT" Lipid Panel     Component Value Date/Time   CHOL 186 04/19/2023 1134  TRIG 85 04/19/2023 1134   HDL 68 04/19/2023 1134   CHOLHDL 3 08/16/2016 1011   VLDL 12.2 08/16/2016 1011   LDLCALC 103 (H) 04/19/2023 1134   Hepatic Function Panel     Component Value Date/Time   PROT 6.8 04/19/2023 1134   ALBUMIN 4.2 04/19/2023 1134   AST 20 04/19/2023 1134   ALT 14 04/19/2023 1134   ALKPHOS 83 04/19/2023 1134   BILITOT 0.3 04/19/2023 1134   BILIDIR 0.1 06/12/2013 1632   IBILI 0.2 06/11/2009 2309      Component Value Date/Time   TSH 2.270 12/09/2021 1442   Nutritional Lab Results  Component Value Date   VD25OH 50.9 04/19/2023   VD25OH 64.8 12/20/2022   VD25OH 52.7 08/18/2022     Assessment and Plan    Obesity   She is working on weight loss  with a calorie goal of 1200 calories per day and 85 mg of protein. She has lost 2 pounds in the last two months. Emotional eating behaviors are present, and stress is high due to caregiving responsibilities. Topiramate is considered for emotional eating behaviors and potential weight management benefits.   - Continue current dietary plan with a focus on meeting nutritional goals more consistently.   - Encourage regular physical activity as tolerated.   - Prescribe topiramate 50 mg for emotional eating behaviors.   - Refill Mounjaro for weight management.    Type 2 Diabetes Mellitus   She is managed with metformin and Mounjaro. Her recent A1c is 5.5, indicating good glycemic control. She is concerned about her father's diabetes management, which has been challenging due to his recent hospitalization and high stress levels. The importance of managing blood glucose levels, especially in the context of caregiving, is emphasized.   - Continue metformin and Mounjaro for diabetes management.   - Monitor blood glucose levels regularly.   - Provide education on diabetes management and dietary considerations, especially in the context of caregiving for her father.    Fibromyalgia   She reports symptoms consistent with fibromyalgia, including pain in arms and legs, exacerbated by stress. She has been diagnosed with fibromyalgia by a rheumatologist and is seeking alternative treatment options to Lyrica due to concerns about side effects. Topiramate is considered for its potential benefits in neuropathic pain management.   - Prescribe topiramate 50 mg for fibromyalgia-related pain management.   - Continue bupropion for mood stabilization.    Caregiver Fatigue   She is experiencing high levels of stress due to caregiving responsibilities for her father, who has multiple health issues. She is encouraged to find time for self-care and stress reduction activities. Thrifting and crafts are identified as enjoyable  activities for stress reduction.   - Encourage engagement in activities she enjoys, such as thrifting and crafts, for stress reduction.   - Discuss the importance of self-care and finding time for personal relaxation.    Vitamin D Deficiency   Her vitamin D level is 50.9, which is within the normal range, but supplementation is continued to maintain levels, especially during winter months.   - Continue vitamin D supplementation.         I have personally spent 55 minutes total time today in preparation, patient care, and documentation for this visit, including the following: review of clinical lab tests; review of medical tests/procedures/services.    She was informed of the importance of frequent follow up visits to maximize her success with intensive lifestyle modifications for her multiple health conditions.   Follow  up in 6-8 weeks  Quillian Quince, MD

## 2023-06-18 ENCOUNTER — Encounter (INDEPENDENT_AMBULATORY_CARE_PROVIDER_SITE_OTHER): Payer: Self-pay | Admitting: Family Medicine

## 2023-06-18 DIAGNOSIS — F3289 Other specified depressive episodes: Secondary | ICD-10-CM

## 2023-06-19 ENCOUNTER — Other Ambulatory Visit (INDEPENDENT_AMBULATORY_CARE_PROVIDER_SITE_OTHER): Payer: Self-pay | Admitting: Family Medicine

## 2023-06-19 DIAGNOSIS — F3289 Other specified depressive episodes: Secondary | ICD-10-CM

## 2023-06-19 MED ORDER — TOPIRAMATE 50 MG PO TABS: 50.0000 mg | ORAL_TABLET | Freq: Every day | ORAL | 0 refills | Status: DC

## 2023-06-20 DIAGNOSIS — R3915 Urgency of urination: Secondary | ICD-10-CM | POA: Diagnosis not present

## 2023-06-20 DIAGNOSIS — N200 Calculus of kidney: Secondary | ICD-10-CM | POA: Diagnosis not present

## 2023-06-27 ENCOUNTER — Encounter: Payer: Self-pay | Admitting: Internal Medicine

## 2023-06-27 NOTE — Progress Notes (Unsigned)
 Subjective:    Patient ID: [name], female    DOB: 20-Apr-1970, 53 y.o.   MRN: [id number]     HPI [name] is here for follow up of her chronic medical problems.  No labs  Needs both shoulders replaced.  The pain causes her not to sleep at night.  Ortho gave her flexeril.  She takes tylenol pm and melatonin.    Having frequent headaches.    Lump in right upper thigh. It has ? Gotten larger.  It has not gone away and is tender. It does bother her.     Medications and allergies reviewed with patient and updated if appropriate.  Current Outpatient Medications on File Prior to Visit  Medication Sig Dispense Refill   acetaminophen (TYLENOL) 650 MG CR tablet Take 1,300 mg by mouth every 8 (eight) hours as needed for pain.     albuterol (VENTOLIN HFA) 108 (90 Base) MCG/ACT inhaler Inhale 2 puffs into the lungs every 6 (six) hours as needed for wheezing or shortness of breath. TAKE 2 PUFFS BY MOUTH EVERY 6 HOURS AS NEEDED FOR WHEEZE OR SHORTNESS OF BREATH 8.5 g 5   aspirin 81 MG chewable tablet Chew 1 tablet (81 mg total) by mouth 2 (two) times daily. 60 tablet 0   buPROPion (WELLBUTRIN SR) 150 MG 12 hr tablet Take 1 tablet (150 mg total) by mouth daily. 90 tablet 0   celecoxib (CELEBREX) 200 MG capsule Take 200 mg by mouth daily.     citalopram (CELEXA) 40 MG tablet Take 1 tablet (40 mg total) by mouth daily. 90 tablet 1   diphenhydramine-acetaminophen (TYLENOL PM) 25-500 MG TABS tablet Take 2 tablets by mouth at bedtime.     famotidine (PEPCID) 20 MG tablet TAKE 1 TABLET BY MOUTH EVERYDAY AT BEDTIME 90 tablet 1   hydroxychloroquine (PLAQUENIL) 200 MG tablet Take 200 mg by mouth 2 (two) times daily.     lisinopril (ZESTRIL) 20 MG tablet TAKE 1 TABLET BY MOUTH EVERY DAY 90 tablet 1   Melatonin 5 MG CHEW Chew 1 tablet by mouth as needed.     metFORMIN (GLUCOPHAGE) 500 MG tablet Take 1 tablet (500 mg total) by mouth 2 (two) times daily with a meal. 180 tablet 0   SKYRIZI PEN  150 MG/ML SOAJ Inject into the skin.     tirzepatide (MOUNJARO) 15 MG/0.5ML Pen Inject 15 mg into the skin once a week. 6 mL 0   tiZANidine (ZANAFLEX) 4 MG tablet Take 4 mg by mouth at bedtime.     topiramate (TOPAMAX) 50 MG tablet Take 1 tablet (50 mg total) by mouth daily. 30 tablet 0   Vitamin D, Ergocalciferol, (DRISDOL) 1.25 MG (50000 UNIT) CAPS capsule Take 1 capsule (50,000 Units total) by mouth every 7 (seven) days. 13 capsule 0   No current facility-administered medications on file prior to visit.     Review of Systems  Constitutional:  Negative for fever.  Respiratory:  Negative for cough (dry - occ - ? lisinopril), shortness of breath and wheezing.   Cardiovascular:  Negative for chest pain, palpitations and leg swelling.  Gastrointestinal:  Positive for constipation (medication related).  Musculoskeletal:  Positive for arthralgias, back pain and neck pain (tension).  Neurological:  Positive for light-headedness (intermittent) and headaches.       Objective:   Vitals:   06/28/23 1309 06/28/23 1940  BP: (!) 98/58 100/60  Pulse: 91   Temp: 98.5 F (36.9 C)  SpO2: 98%    BP Readings from Last 3 Encounters:  06/28/23 100/60  06/14/23 113/72  04/19/23 115/67   Wt Readings from Last 3 Encounters:  06/28/23 197 lb 9.6 oz (89.6 kg)  06/14/23 195 lb (88.5 kg)  04/19/23 197 lb (89.4 kg)   Body mass index is 30.04 kg/m.    Physical Exam Constitutional:      General: She is not in acute distress.    Appearance: Normal appearance.  HENT:     Head: Normocephalic and atraumatic.  Eyes:     Conjunctiva/sclera: Conjunctivae normal.  Cardiovascular:     Rate and Rhythm: Normal rate and regular rhythm.     Heart sounds: Normal heart sounds.  Pulmonary:     Effort: Pulmonary effort is normal. No respiratory distress.     Breath sounds: Normal breath sounds. No wheezing.  Musculoskeletal:     Cervical back: Neck supple.     Right lower leg: No edema.     Left lower  leg: No edema.  Lymphadenopathy:     Cervical: No cervical adenopathy.  Skin:    General: Skin is warm and dry.     Findings: No rash.  Neurological:     Mental Status: She is alert. Mental status is at baseline.  Psychiatric:        Mood and Affect: Mood normal.        Behavior: Behavior normal.        Lab Results  Component Value Date   WBC 6.6 12/09/2021   HGB 12.1 12/09/2021   HCT 37.1 12/09/2021   PLT 276 12/09/2021   GLUCOSE 71 04/19/2023   CHOL 186 04/19/2023   TRIG 85 04/19/2023   HDL 68 04/19/2023   LDLCALC 103 (H) 04/19/2023   ALT 14 04/19/2023   AST 20 04/19/2023   NA 143 04/19/2023   K 3.7 04/19/2023   CL 102 04/19/2023   CREATININE 0.96 04/19/2023   BUN 19 04/19/2023   CO2 23 04/19/2023   TSH 2.270 12/09/2021   HGBA1C 5.5 04/19/2023     Assessment & Plan:    See Problem List for Assessment and Plan of chronic medical problems.

## 2023-06-28 ENCOUNTER — Ambulatory Visit (INDEPENDENT_AMBULATORY_CARE_PROVIDER_SITE_OTHER): Payer: Self-pay | Admitting: Internal Medicine

## 2023-06-28 VITALS — BP 100/60 | HR 91 | Temp 98.5°F | Wt 197.6 lb

## 2023-06-28 DIAGNOSIS — G8929 Other chronic pain: Secondary | ICD-10-CM

## 2023-06-28 DIAGNOSIS — R2241 Localized swelling, mass and lump, right lower limb: Secondary | ICD-10-CM | POA: Diagnosis not present

## 2023-06-28 DIAGNOSIS — E782 Mixed hyperlipidemia: Secondary | ICD-10-CM

## 2023-06-28 DIAGNOSIS — K219 Gastro-esophageal reflux disease without esophagitis: Secondary | ICD-10-CM | POA: Diagnosis not present

## 2023-06-28 DIAGNOSIS — M5441 Lumbago with sciatica, right side: Secondary | ICD-10-CM

## 2023-06-28 DIAGNOSIS — E1169 Type 2 diabetes mellitus with other specified complication: Secondary | ICD-10-CM

## 2023-06-28 DIAGNOSIS — E669 Obesity, unspecified: Secondary | ICD-10-CM | POA: Diagnosis not present

## 2023-06-28 DIAGNOSIS — I10 Essential (primary) hypertension: Secondary | ICD-10-CM | POA: Diagnosis not present

## 2023-06-28 DIAGNOSIS — L4050 Arthropathic psoriasis, unspecified: Secondary | ICD-10-CM

## 2023-06-28 DIAGNOSIS — J4520 Mild intermittent asthma, uncomplicated: Secondary | ICD-10-CM

## 2023-06-28 DIAGNOSIS — F3289 Other specified depressive episodes: Secondary | ICD-10-CM | POA: Diagnosis not present

## 2023-06-28 DIAGNOSIS — Z7985 Long-term (current) use of injectable non-insulin antidiabetic drugs: Secondary | ICD-10-CM

## 2023-06-28 DIAGNOSIS — F419 Anxiety disorder, unspecified: Secondary | ICD-10-CM | POA: Diagnosis not present

## 2023-06-28 DIAGNOSIS — M5442 Lumbago with sciatica, left side: Secondary | ICD-10-CM | POA: Diagnosis not present

## 2023-06-28 NOTE — Assessment & Plan Note (Signed)
Chronic LDL well controlled Continue lifestyle control Regular exercise and healthy diet encouraged

## 2023-06-28 NOTE — Assessment & Plan Note (Signed)
Chronic Controlled, Stable Continue Celexa 20 mg daily

## 2023-06-28 NOTE — Assessment & Plan Note (Signed)
Chronic GERD controlled Continue Pepcid 20 mg nightly

## 2023-06-28 NOTE — Assessment & Plan Note (Signed)
Chronic Controlled, Stable Continue Celexa 20 mg daily, welbutrin sr 150 mg daily

## 2023-06-28 NOTE — Assessment & Plan Note (Signed)
 Chronic Continues to lose weight with mounjaro, topamax and metformin

## 2023-06-28 NOTE — Assessment & Plan Note (Signed)
 Chronic Has had 2 surgeries in the past-the most recent June 2021 Still having lower back pain and pain down the right leg.  She has numbness in the right leg.

## 2023-06-28 NOTE — Assessment & Plan Note (Signed)
 Chronic Blood pressure on low side  Stop hydrochlorothiazide  Continue lisinopril 20 mg daily Monitor BP at home

## 2023-06-28 NOTE — Assessment & Plan Note (Signed)
 Chronic  Lab Results  Component Value Date   HGBA1C 5.5 04/19/2023   Sugars controlled Continue mounjaro 15 mg weekly, metformin 500 mg bid Stressed regular exercise, diabetic diet

## 2023-06-28 NOTE — Patient Instructions (Addendum)
        Medications changes include :   stop hydrochlorothiazide  - monitor your BP - goal is less than 130/80     An ultrasound of the right leg was ordered and someone will call you to schedule an appointment.     Return in about 6 months (around 12/29/2023) for Physical Exam.

## 2023-06-28 NOTE — Assessment & Plan Note (Signed)
Chronic Following with [facility] On hydroxychloroquine 200 mg twice daily, Skyrizi, Celebrex Controlled

## 2023-06-30 ENCOUNTER — Ambulatory Visit (INDEPENDENT_AMBULATORY_CARE_PROVIDER_SITE_OTHER): Payer: PPO

## 2023-06-30 VITALS — Ht 68.0 in | Wt 195.0 lb

## 2023-06-30 DIAGNOSIS — Z0000 Encounter for general adult medical examination without abnormal findings: Secondary | ICD-10-CM

## 2023-06-30 NOTE — Patient Instructions (Addendum)
 [name] , Thank you for taking time to come for your Medicare Wellness Visit. I appreciate your ongoing commitment to your health goals. Please review the following plan we discussed and let me know if I can assist you in the future.   Referrals/Orders/Follow-Ups/Clinician Recommendations: Aim for 30 minutes of exercise or brisk walking, 6-8 glasses of water, and 5 servings of fruits and vegetables each day.   This is a list of the screening recommended for you and due dates:  Health Maintenance  Topic Date Due   Complete foot exam   Never done   Eye exam for diabetics  Never done   DTaP/Tdap/Td vaccine (2 - Td or Tdap) 08/15/2022   COVID-19 Vaccine (4 - 2024-25 season) 12/04/2022   Yearly kidney health urinalysis for diabetes  08/18/2023   Hemoglobin A1C  10/17/2023   Yearly kidney function blood test for diabetes  04/18/2024   Medicare Annual Wellness Visit  06/29/2024   Mammogram  09/14/2024   Colon Cancer Screening  07/14/2029   Flu Shot  Completed   HIV Screening  Completed   HPV Vaccine  Aged Out   Pneumococcal Vaccination  Discontinued   Hepatitis C Screening  Discontinued   Zoster (Shingles) Vaccine  Discontinued    Advanced directives: (Declined) Advance directive discussed with you today. Even though you declined this today, please call our office should you change your mind, and we can give you the proper paperwork for you to fill out.  Next Medicare Annual Wellness Visit scheduled for next year: Yes

## 2023-06-30 NOTE — Progress Notes (Signed)
 Subjective:   [name] is a 53 y.o. who presents for a Medicare Wellness preventive visit.  Visit Complete: Virtual I connected with  Einar Grad on 06/30/23 by a audio enabled telemedicine application and verified that I am speaking with the correct person using two identifiers.  Patient Location: Home  Provider Location: Office/Clinic  I discussed the limitations of evaluation and management by telemedicine. The patient expressed understanding and agreed to proceed.  Vital Signs: Because this visit was a virtual/telehealth visit, some criteria may be missing or patient reported. Any vitals not documented were not able to be obtained and vitals that have been documented are patient reported.  VideoDeclined- This patient declined Librarian, academic. Therefore the visit was completed with audio only.  Persons Participating in Visit: Patient.  AWV Questionnaire: No: Patient Medicare AWV questionnaire was not completed prior to this visit.  Cardiac Risk Factors include: advanced age (>21men, >90 women);dyslipidemia;hypertension     Objective:    Today's Vitals   06/30/23 1527  Weight: 195 lb (88.5 kg)  Height: 5\' 8"  (1.727 m)   Body mass index is 29.65 kg/m.     06/30/2023    3:22 PM 02/02/2022    3:23 PM 01/12/2022    6:17 AM 01/11/2022    1:45 PM 12/30/2021    1:28 PM 10/26/2020    1:09 PM 05/11/2020   10:27 AM  Advanced Directives  Does Patient Have a Medical Advance Directive? No No No No Yes Yes Yes  Type of Advance Directive      Living will;Healthcare Power of Attorney Living will  Does patient want to make changes to medical advance directive?      No - Patient declined   Copy of Healthcare Power of Attorney in Chart?      No - copy requested   Would patient like information on creating a medical advance directive? No - Patient declined No - Patient declined No - Patient declined No - Patient declined       Current  Medications (verified) Outpatient Encounter Medications as of 06/30/2023  Medication Sig   acetaminophen (TYLENOL) 650 MG CR tablet Take 1,300 mg by mouth every 8 (eight) hours as needed for pain.   albuterol (VENTOLIN HFA) 108 (90 Base) MCG/ACT inhaler Inhale 2 puffs into the lungs every 6 (six) hours as needed for wheezing or shortness of breath. TAKE 2 PUFFS BY MOUTH EVERY 6 HOURS AS NEEDED FOR WHEEZE OR SHORTNESS OF BREATH   aspirin 81 MG chewable tablet Chew 1 tablet (81 mg total) by mouth 2 (two) times daily.   buPROPion (WELLBUTRIN SR) 150 MG 12 hr tablet Take 1 tablet (150 mg total) by mouth daily.   celecoxib (CELEBREX) 100 MG capsule Take 100 mg by mouth daily.   citalopram (CELEXA) 40 MG tablet Take 1 tablet (40 mg total) by mouth daily.   diphenhydramine-acetaminophen (TYLENOL PM) 25-500 MG TABS tablet Take 2 tablets by mouth at bedtime.   famotidine (PEPCID) 20 MG tablet TAKE 1 TABLET BY MOUTH EVERYDAY AT BEDTIME   lisinopril (ZESTRIL) 20 MG tablet TAKE 1 TABLET BY MOUTH EVERY DAY   Melatonin 5 MG CHEW Chew 1 tablet by mouth as needed.   metFORMIN (GLUCOPHAGE) 500 MG tablet Take 1 tablet (500 mg total) by mouth 2 (two) times daily with a meal.   SKYRIZI PEN 150 MG/ML SOAJ Inject into the skin.   tirzepatide (MOUNJARO) 15 MG/0.5ML Pen Inject 15 mg into the skin  once a week.   topiramate (TOPAMAX) 50 MG tablet Take 1 tablet (50 mg total) by mouth daily.   Vitamin D, Ergocalciferol, (DRISDOL) 1.25 MG (50000 UNIT) CAPS capsule Take 1 capsule (50,000 Units total) by mouth every 7 (seven) days.   [DISCONTINUED] celecoxib (CELEBREX) 200 MG capsule Take 200 mg by mouth daily.   [DISCONTINUED] hydroxychloroquine (PLAQUENIL) 200 MG tablet Take 200 mg by mouth 2 (two) times daily.   [DISCONTINUED] tiZANidine (ZANAFLEX) 4 MG tablet Take 4 mg by mouth at bedtime.   No facility-administered encounter medications on file as of 06/30/2023.    Allergies (verified) Chlorhexidine gluconate, Ivp dye  [iodinated contrast media], Nalbuphine, Septra [sulfamethoxazole-trimethoprim], Adhesive [tape], Cymbalta [duloxetine hcl], Diclofenac, Humira [adalimumab], Methotrexate derivatives, Augmentin [amoxicillin-pot clavulanate], Betadine [povidone iodine], Latex, and Otezla [apremilast]   History: Past Medical History:  Diagnosis Date   Anemia    Anxiety    Arthritis    inflammitory   Asthma    B12 deficiency    Back pain    Chronic joint pain    Constipation    DDD (degenerative disc disease), lumbar    low back and neck and shoulders   Depression    during divorce & legal matters   Fatty liver    Fibromyalgia    Gallbladder problem    GERD (gastroesophageal reflux disease)    History of kidney stones    Dr Dwana Curd   History of polycystic ovarian disease S/P BSO   Hx of anxiety disorder    Hypertension    a   Hypothyroidism    no meds   IBS (irritable bowel syndrome)    IBS (irritable bowel syndrome)    Infertility, female    Joint pain    Kidney problem    Lactose intolerance    Lupus hx positive ANA   followed by Dr Zenovia Jordan; possible lupus   Multiple food allergies    Myalgia    Osteoarthritis    Polyarthritis, 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HCC)    POLYCYSTIC OVARIAN DISEASE 03/02/2007   Qualifier: Diagnosis of  By: Alwyn Ren MD, Winnifred Friar BSO for cysts & endometriosis; Dr Malachy Mood, Gyn Seeing Dr Huel Cote     PONV (postoperative nausea and vomiting)    Pre-diabetes    Prediabetes    Sweating profusely    Symptomatic mammary hypertrophy    URI (upper respiratory infection)    currently on cefdinir   Past Surgical History:  Procedure Laterality Date   ABDOMINAL HYSTERECTOMY     ANKLE SURGERY     X3   APPENDECTOMY  1991   exploratory lap   BACK SURGERY     BREAST REDUCTION SURGERY Bilateral 03/26/2015   Procedure: BILATERAL BREAST REDUCTION ;  Surgeon: Peggye Form, DO;  Location: [facility];  Service: Plastics;  Laterality: Bilateral;    CARPAL TUNNEL RELEASE     bilateral; Dr Newell Coral   COLONOSCOPY     in 1990s   CYSTO/ RIGHT RETROGRADE URETERAL PYELOGRAM  07-09-2004   HX BILATERAL RENAL STONES/ RIGHT FLANK PAIN   CYSTOSCOPY W/ URETERAL STENT PLACEMENT  11/12/2011   Procedure: CYSTOSCOPY WITH RETROGRADE PYELOGRAM/URETERAL STENT PLACEMENT;  Surgeon: Sebastian Ache, MD;  Location: [facility];  Service: Urology;  Laterality: Right;   CYSTOSCOPY W/ URETERAL STENT REMOVAL  12/07/2011   Procedure: CYSTOSCOPY WITH STENT REMOVAL;  Surgeon: Sebastian Ache, MD;  Location: [facility];  Service: Urology;  Laterality: Right;   CYSTOSCOPY/RETROGRADE/URETEROSCOPY/STONE EXTRACTION WITH BASKET  12/07/2011   Procedure: CYSTOSCOPY/RETROGRADE/URETEROSCOPY/STONE EXTRACTION WITH BASKET;  Surgeon: Sebastian Ache, MD;  Location: [facility];  Service: Urology;  Laterality: Right;   DILATION AND CURETTAGE OF UTERUS     X5   EXCISION LEFT BARTHOLIN GLAND  02-05-2002   EYE SURGERY     Retinal tears surgery bilateral   LAPAROSCOPIC ASSISTED VAGINAL HYSTERECTOMY  2000   LAPAROSCOPIC CHOLECYSTECTOMY  05-31-2007   LAPAROSCOPIC LASER ABLATION ENDOMETRIOSIS AND LEFT SALPINGO-OOPHORECTOMY  11-03-1999   LAPAROSCOPY WITH RIGHT SALPINGO-OOPHECTOMY/ LYSIS ADHESIONS AND ABLATION ENDOMETRIOSIS  05-17-2001   LIPOSUCTION Bilateral 03/26/2015   Procedure: WITH LIPOSUCTION;  Surgeon: Peggye Form, DO;  Location: [facility];  Service: Plastics;  Laterality: Bilateral;   LUMBAR LAMINECTOMY  2003   L4 - L5   NECK SURGERY  2017   PLANTAR FASCIA SURGERY     right   PLANTAR FASCIA SURGERY  02/2011   left   REDUCTION MAMMAPLASTY     RIGHT URETEROSCOPIC STONE EXTRACTION  08-04-2000   SHOULDER ARTHROSCOPY WITH ROTATOR CUFF REPAIR Right 05/11/2020   Procedure: SHOULDER ARTHROSCOPY WITH ROTATOR CUFF REPAIR;  Surgeon: Lyndle Herrlich, MD;  Location: [facility];  Service: Orthopedics;  Laterality: Right;   TONSILLECTOMY     TOTAL  HIP ARTHROPLASTY Left 01/11/2022   Procedure: TOTAL HIP ARTHROPLASTY ANTERIOR APPROACH;  Surgeon: Lyndle Herrlich, MD;  Location: [facility];  Service: Orthopedics;  Laterality: Left;   URETERAL STENT PLACEMENT  11/12/11   Dr Berneice Heinrich   Family History  Problem Relation Age of Onset   Hypertension Mother    Colon polyps Mother    Atrial fibrillation Mother        Dr Clifton James   Hyperlipidemia Mother    Heart disease Mother    Anxiety disorder Mother    Sleep apnea Mother    Obesity Mother    Heart attack Father 57   Diabetes Father    Hypertension Father    Hyperlipidemia Father    Heart disease Father    Stroke Father    Obesity Father    Hypertension Sister    Heart attack Paternal Grandmother 79   Breast cancer Paternal Aunt 27   Colon cancer Maternal Uncle    Colon cancer Paternal Uncle    Colon polyps Maternal Grandmother    Stroke Maternal Grandmother 54   Diabetes Paternal Grandfather    Stroke Maternal Grandfather 37   Social History   Socioeconomic History   Marital status: Married    Spouse name: Feliz Beam   Number of children: 0   Years of education: college   Highest education level: Some college, no degree  Occupational History   Occupation: disabled  Tobacco Use   Smoking status: Never    Passive exposure: Never   Smokeless tobacco: Never  Vaping Use   Vaping status: Never Used  Substance and Sexual Activity   Alcohol use: Not Currently   Drug use: No   Sexual activity: Yes    Birth control/protection: Surgical  Other Topics Concern   Not on file  Social History Narrative   Patient Lives at home with her husband Feliz Beam)   Disabled.   Education two years of college.   Right handed.   Caffeine coffee and sweet tea. Not daily.   Social Drivers of Corporate investment banker Strain: Low Risk  (06/30/2023)   Overall Financial Resource Strain (CARDIA)    Difficulty of Paying Living Expenses: Not hard at all  Food Insecurity:  No Food Insecurity  (06/30/2023)   Hunger Vital Sign    Worried About Running Out of Food in the Last Year: Never true    Ran Out of Food in the Last Year: Never true  Transportation Needs: No Transportation Needs (06/30/2023)   PRAPARE - Administrator, Civil Service (Medical): No    Lack of Transportation (Non-Medical): No  Physical Activity: Insufficiently Active (06/30/2023)   Exercise Vital Sign    Days of Exercise per Week: 5 days    Minutes of Exercise per Session: 10 min  Stress: Stress Concern Present (06/30/2023)   [employer] of Occupational Health - Occupational Stress Questionnaire    Feeling of Stress : Rather much  Social Connections: Socially Integrated (06/30/2023)   Social Connection and Isolation Panel [NHANES]    Frequency of Communication with Friends and Family: More than three times a week    Frequency of Social Gatherings with Friends and Family: More than three times a week    Attends Religious Services: More than 4 times per year    Active Member of [employer] or Organizations: Yes    Attends Engineer, structural: More than 4 times per year    Marital Status: Married    Tobacco Counseling Counseling given: No    Clinical Intake:  Pre-visit preparation completed: Yes  Pain : No/denies pain     BMI - recorded: 29.65 Nutritional Status: BMI 25 -29 Overweight Nutritional Risks: None Diabetes: Yes CBG done?: No Did pt. bring in CBG monitor from home?: No  Lab Results  Component Value Date   HGBA1C 5.5 04/19/2023   HGBA1C 5.3 12/20/2022   HGBA1C 5.0 08/18/2022     How often do you need to have someone help you when you read instructions, pamphlets, or other written materials from your doctor or pharmacy?: 1 - Never  Interpreter Needed?: No  Information entered by :: Hassell Halim, CMA   Activities of Daily Living     06/30/2023    3:30 PM  In your present state of health, do you have any difficulty performing the following activities:   Hearing? 0  Vision? 0  Difficulty concentrating or making decisions? 0  Walking or climbing stairs? 0  Dressing or bathing? 0  Doing errands, shopping? 0  Preparing Food and eating ? N  Using the Toilet? N  In the past six months, have you accidently leaked urine? Y  Comment only when exp kidney stones - does have a Urologist  Do you have problems with loss of bowel control? N  Managing your Medications? N  Managing your Finances? N  Housekeeping or managing your Housekeeping? N    Patient Care Team: Pincus Sanes, MD as PCP - General (Internal Medicine) Kathleene Hazel, MD as PCP - Cardiology (Cardiology) Zenovia Jordan, MD as Consulting Physician (Rheumatology) Kathyrn Sheriff, [facility] (Inactive) as Pharmacist (Pharmacist)  Indicate any recent Medical Services you may have received from other than [facility] providers in the past year (date may be approximate).     Assessment:   This is a routine wellness examination for [place].  Hearing/Vision screen Hearing Screening - Comments:: Denies hearing difficulties   Vision Screening - Comments:: Wears rx glasses - up to date with routine eye exams with [facility]   Goals Addressed               This Visit's Progress     Weight (lb) < 200 lb (90.7 kg) (pt-stated)  195 lb (88.5 kg)     Patient stated she plans to continue to lose weight.       Depression Screen     06/30/2023    3:35 PM 12/29/2022   11:05 AM 06/28/2022   10:21 AM 10/29/2021   12:55 PM 04/28/2021    1:10 PM 10/26/2020    4:54 PM 10/26/2020    1:08 PM  PHQ 2/9 Scores  PHQ - 2 Score 1 0 0 0 0 0 0  PHQ- 9 Score 5  2 0 4 2     Fall Risk     06/30/2023    3:32 PM 12/29/2022   11:04 AM 06/28/2022   10:21 AM 02/02/2022    3:16 PM 10/29/2021   12:55 PM  Fall Risk   Falls in the past year? 0 0 0 0 1  Number falls in past yr: 0 0 0 0 1  Injury with Fall? 0  0 0 0  Risk for fall due to : No Fall Risks No Fall Risks No Fall Risks History of  fall(s);Impaired balance/gait;Impaired mobility No Fall Risks  Follow up Falls prevention discussed;Falls evaluation completed Falls evaluation completed Falls evaluation completed Falls evaluation completed Falls evaluation completed    MEDICARE RISK AT HOME:  Medicare Risk at Home Any stairs in or around the home?: Yes (outside) If so, are there any without handrails?: No Home free of loose throw rugs in walkways, pet beds, electrical cords, etc?: Yes Adequate lighting in your home to reduce risk of falls?: Yes Life alert?: No Use of a cane, walker or w/c?: No Grab bars in the bathroom?: Yes Shower chair or bench in shower?: Yes Elevated toilet seat or a handicapped toilet?: Yes  TIMED UP AND GO:  Was the test performed?  No  Cognitive Function: 6CIT completed    02/02/2022    3:11 PM 09/22/2014   10:18 AM  MMSE - Mini Mental State Exam  Not completed: Refused Unable to complete        06/30/2023    3:38 PM  6CIT Screen  What Year? 0 points  What month? 0 points  What time? 0 points  Count back from 20 0 points  Months in reverse 0 points  Repeat phrase 0 points  Total Score 0 points    Immunizations Immunization History  Administered Date(s) Administered   Influenza Split 02/17/2011   Influenza Whole 01/02/2010   Influenza, Mdck, Trivalent,PF 6+ MOS(egg free) 01/25/2023   Influenza,inj,Quad PF,6+ Mos 01/02/2013, 01/11/2017, 01/03/2018, 02/07/2019, 01/12/2022   Influenza-Unspecified 01/01/2014, 01/08/2016, 01/11/2017, 03/04/2020, 01/18/2021   PFIZER(Purple Top)SARS-COV-2 Vaccination 07/16/2019, 08/06/2019, 03/12/2020   Pneumococcal Polysaccharide-23 06/04/2008, 05/02/2013   Tdap 08/14/2012   Zoster Recombinant(Shingrix) 08/23/2020    Screening Tests Health Maintenance  Topic Date Due   FOOT EXAM  Never done   OPHTHALMOLOGY EXAM  Never done   DTaP/Tdap/Td (2 - Td or Tdap) 08/15/2022   COVID-19 Vaccine (4 - 2024-25 season) 12/04/2022   Diabetic kidney  evaluation - Urine ACR  08/18/2023   HEMOGLOBIN A1C  10/17/2023   Diabetic kidney evaluation - eGFR measurement  04/18/2024   Medicare Annual Wellness (AWV)  06/29/2024   MAMMOGRAM  09/14/2024   Colonoscopy  07/14/2029   INFLUENZA VACCINE  Completed   HIV Screening  Completed   HPV VACCINES  Aged Out   Pneumococcal Vaccine 43-20 Years old  Discontinued   Hepatitis C Screening  Discontinued   Zoster Vaccines- Shingrix  Discontinued    Health  Maintenance  Health Maintenance Due  Topic Date Due   FOOT EXAM  Never done   OPHTHALMOLOGY EXAM  Never done   DTaP/Tdap/Td (2 - Td or Tdap) 08/15/2022   COVID-19 Vaccine (4 - 2024-25 season) 12/04/2022   Health Maintenance Items Addressed:06/30/2023  Tdap status: Pt stated did receive vaccine at local pharmacy and will obtain the documentation for her PCP.  Foot Exam status: Appt w/PCP 12/29/2023 to complete.   Additional Screening:  Vision Screening: Recommended annual ophthalmology exams for early detection of glaucoma and other disorders of the eye.  Dental Screening: Recommended annual dental exams for proper oral hygiene  Community Resource Referral / Chronic Care Management: CRR required this visit?  No   CCM required this visit?  No     Plan:     I have personally reviewed and noted the following in the patient's chart:   Medical and social history Use of alcohol, tobacco or illicit drugs  Current medications and supplements including opioid prescriptions. Patient is not currently taking opioid prescriptions. Functional ability and status Nutritional status Physical activity Advanced directives List of other physicians Hospitalizations, surgeries, and ER visits in previous 12 months Vitals Screenings to include cognitive, depression, and falls Referrals and appointments  In addition, I have reviewed and discussed with patient certain preventive protocols, quality metrics, and best practice recommendations. A  written personalized care plan for preventive services as well as general preventive health recommendations were provided to patient.     Darreld Mclean, CMA   06/30/2023   After Visit Summary: (MyChart) Due to this being a telephonic visit, the after visit summary with patients personalized plan was offered to patient via MyChart   Notes: Nothing significant to report at this time.

## 2023-07-06 ENCOUNTER — Encounter: Payer: Self-pay | Admitting: Internal Medicine

## 2023-07-06 ENCOUNTER — Ambulatory Visit
Admission: RE | Admit: 2023-07-06 | Discharge: 2023-07-06 | Disposition: A | Discharge status: Home / Self Care | Source: Ambulatory Visit | Attending: Internal Medicine

## 2023-07-06 DIAGNOSIS — R599 Enlarged lymph nodes, unspecified: Secondary | ICD-10-CM | POA: Diagnosis not present

## 2023-07-06 DIAGNOSIS — R2241 Localized swelling, mass and lump, right lower limb: Secondary | ICD-10-CM

## 2023-07-26 ENCOUNTER — Encounter (INDEPENDENT_AMBULATORY_CARE_PROVIDER_SITE_OTHER): Payer: Self-pay | Admitting: Family Medicine

## 2023-07-26 ENCOUNTER — Ambulatory Visit (INDEPENDENT_AMBULATORY_CARE_PROVIDER_SITE_OTHER): Admitting: Family Medicine

## 2023-07-26 VITALS — BP 127/80 | HR 91 | Temp 98.3°F | Ht 68.0 in | Wt 195.0 lb

## 2023-07-26 DIAGNOSIS — I10 Essential (primary) hypertension: Secondary | ICD-10-CM | POA: Diagnosis not present

## 2023-07-26 DIAGNOSIS — K5909 Other constipation: Secondary | ICD-10-CM | POA: Diagnosis not present

## 2023-07-26 DIAGNOSIS — Z6829 Body mass index (BMI) 29.0-29.9, adult: Secondary | ICD-10-CM | POA: Diagnosis not present

## 2023-07-26 DIAGNOSIS — E559 Vitamin D deficiency, unspecified: Secondary | ICD-10-CM

## 2023-07-26 DIAGNOSIS — F3289 Other specified depressive episodes: Secondary | ICD-10-CM

## 2023-07-26 DIAGNOSIS — F5089 Other specified eating disorder: Secondary | ICD-10-CM

## 2023-07-26 DIAGNOSIS — E669 Obesity, unspecified: Secondary | ICD-10-CM

## 2023-07-26 DIAGNOSIS — E1169 Type 2 diabetes mellitus with other specified complication: Secondary | ICD-10-CM

## 2023-07-26 MED ORDER — BUPROPION HCL ER (SR) 100 MG PO TB12: 100.0000 mg | ORAL_TABLET | Freq: Two times a day (BID) | ORAL | 1 refills | Status: DC

## 2023-07-26 MED ORDER — TOPIRAMATE 50 MG PO TABS: 50.0000 mg | ORAL_TABLET | Freq: Every day | ORAL | 0 refills | Status: DC

## 2023-07-26 MED ORDER — TIRZEPATIDE 15 MG/0.5ML ~~LOC~~ SOAJ: 15.0000 mg | SUBCUTANEOUS | 0 refills | Status: DC

## 2023-07-26 MED ORDER — VITAMIN D (ERGOCALCIFEROL) 1.25 MG (50000 UNIT) PO CAPS: 50000.0000 [IU] | ORAL_CAPSULE | ORAL | 0 refills | Status: DC

## 2023-07-26 MED ORDER — METFORMIN HCL 500 MG PO TABS: 500.0000 mg | ORAL_TABLET | Freq: Two times a day (BID) | ORAL | 0 refills | Status: DC

## 2023-07-26 MED ORDER — VITAMIN D (ERGOCALCIFEROL) 1.25 MG (50000 UNIT) PO CAPS
50000.0000 [IU] | ORAL_CAPSULE | ORAL | 0 refills | Status: AC
Start: 2023-07-26 — End: ?

## 2023-07-26 NOTE — Progress Notes (Signed)
 Office: [phone]  /  Fax: [phone]  WEIGHT SUMMARY AND BIOMETRICS  Anthropometric Measurements Height: 5\' 8"  (1.727 m) Weight: 195 lb (88.5 kg) BMI (Calculated): 29.66 Weight at Last Visit: 195 lb Weight Lost Since Last Visit: 0 Weight Gained Since Last Visit: 0 Starting Weight: 241 lb Total Weight Loss (lbs): 46 lb (20.9 kg)   Body Composition  Body Fat %: 39.7 % Fat Mass (lbs): 77.4 lbs Muscle Mass (lbs): 111.8 lbs Total Body Water  (lbs): 78 lbs Visceral Fat Rating : 9   Other Clinical Data Fasting: no Labs: no Today's Visit #: 64 Starting Date: 09/28/16    Chief Complaint: OBESITY    History of Present Illness [name] "Burdette Carolin" is a 53 year old female who presents for obesity treatment follow-up.  She has been adhering to a prescribed eating plan with 1200 calories and 85 mg of protein approximately 40% of the time. Her weight has remained stable at 195 pounds over the last month, with an increase in muscle mass and a decrease in fat. She engages in physical activity primarily through yard work, including mowing and weeding multiple acres, which she finds physically demanding, especially on her shoulders.  She experiences constipation, describing her bowel movements as 'hard as a rock' and painful, leading to hemorrhoids. She has tried various treatments including Miralax , Senokot, Dulcolax, Linzess , and probiotics, with limited success. Currently, she takes Linzess  145 mcg daily, probiotics twice a day, and occasionally Dulcolax.  She experiences dry mouth, which she attributes to her medication, Wellbutrin , taken at 150 mg once daily. She manages this with increased hydration and mints.  Her blood pressure is stable, and she is currently on 20 mg of lisinopril , having discontinued hydrochlorothiazide .        PHYSICAL EXAM:  Blood pressure 127/80, pulse 91, temperature 98.3 F (36.8 C), height 5\' 8"  (1.727 m), weight 195 lb (88.5 kg), SpO2  99%. Body mass index is 29.65 kg/m.  DIAGNOSTIC DATA REVIEWED:  BMET    Component Value Date/Time   NA 143 04/19/2023 1134   K 3.7 04/19/2023 1134   CL 102 04/19/2023 1134   CO2 23 04/19/2023 1134   GLUCOSE 71 04/19/2023 1134   GLUCOSE 80 04/28/2020 1444   BUN 19 04/19/2023 1134   CREATININE 0.96 04/19/2023 1134   CALCIUM 9.3 04/19/2023 1134   GFRNONAA 109 11/11/2019 1049   GFRAA 125 11/11/2019 1049   Lab Results  Component Value Date   HGBA1C 5.5 04/19/2023   HGBA1C 6.1 09/22/2010   Lab Results  Component Value Date   INSULIN  19.3 04/19/2023   INSULIN  14.7 09/28/2016   Lab Results  Component Value Date   TSH 2.270 12/09/2021   CBC    Component Value Date/Time   WBC 6.6 12/09/2021 1442   WBC 13.3 (H) 04/28/2020 1444   RBC 4.27 12/09/2021 1442   RBC 4.99 04/28/2020 1444   HGB 12.1 12/09/2021 1442   HCT 37.1 12/09/2021 1442   PLT 276 12/09/2021 1442   MCV 87 12/09/2021 1442   MCH 28.3 12/09/2021 1442   MCH 27.0 04/19/2015 0650   MCHC 32.6 12/09/2021 1442   MCHC 32.1 04/28/2020 1444   RDW 13.4 12/09/2021 1442   Iron Studies No results found for: "IRON", "TIBC", "FERRITIN", "IRONPCTSAT" Lipid Panel     Component Value Date/Time   CHOL 186 04/19/2023 1134   TRIG 85 04/19/2023 1134   HDL 68 04/19/2023 1134   CHOLHDL 3 08/16/2016 1011   VLDL 12.2 08/16/2016  1011   LDLCALC 103 (H) 04/19/2023 1134   Hepatic Function Panel     Component Value Date/Time   PROT 6.8 04/19/2023 1134   ALBUMIN 4.2 04/19/2023 1134   AST 20 04/19/2023 1134   ALT 14 04/19/2023 1134   ALKPHOS 83 04/19/2023 1134   BILITOT 0.3 04/19/2023 1134   BILIDIR 0.1 06/12/2013 1632   IBILI 0.2 06/11/2009 2309      Component Value Date/Time   TSH 2.270 12/09/2021 1442   Nutritional Lab Results  Component Value Date   VD25OH 50.9 04/19/2023   VD25OH 64.8 12/20/2022   VD25OH 52.7 08/18/2022     Assessment and Plan Assessment & Plan Constipation Chronic constipation with hard,  painful bowel movements. Previous treatments with Miralax , Senokot, and Dulcolax were ineffective or caused diarrhea. Currently on Linzess  145 mcg and probiotics, but still experiencing constipation. Discussed combining Linzess  with Miralax  as she works in different ways and complement each other. - Initiate Miralax  1 capful daily in addition to Linzess  - Continue Linzess  145 mcg daily - Continue probiotics twice daily - Reassess bowel movement consistency and frequency in one week  Obesity Obesity management with a prescribed eating plan of 1200 calories and 85 mg of protein. Adherence is approximately 40%. Weight maintenance observed over the last month with increased physical activity through yard work. Muscle mass increase noted with a decrease in fat. - Continue prescribed eating plan with 1200 calories and 85 mg of protein - Encourage increased adherence to the eating plan - Encourage continuation of physical activity through yard work  Hypertension Hypertension is currently well-controlled. Lisinopril  dosage is 20 mg, and hydrochlorothiazide  has been discontinued. Slight increase in water  retention noted, possibly due to discontinuation of hydrochlorothiazide . - Continue lisinopril  20 mg daily - Monitor blood pressure regularly  EEB Dry mouth attributed to Wellbutrin  150 mg daily. Significant discomfort reported, requiring constant hydration and use of mints. Discussed reducing Wellbutrin  dosage to alleviate symptoms but still get some benefit.. - Reduce Wellbutrin  dosage to 100 mg daily - Monitor for improvement in dry mouth symptoms  Vit D deficiency Stable on vit D -RF vit D    She was informed of the importance of frequent follow up visits to maximize her success with intensive lifestyle modifications for her multiple health conditions.    Jasmine Mesi, MD

## 2023-07-31 ENCOUNTER — Telehealth (INDEPENDENT_AMBULATORY_CARE_PROVIDER_SITE_OTHER): Payer: Self-pay | Admitting: *Deleted

## 2023-07-31 DIAGNOSIS — Z96641 Presence of right artificial hip joint: Secondary | ICD-10-CM | POA: Diagnosis not present

## 2023-07-31 DIAGNOSIS — Z96642 Presence of left artificial hip joint: Secondary | ICD-10-CM | POA: Diagnosis not present

## 2023-07-31 DIAGNOSIS — M5459 Other low back pain: Secondary | ICD-10-CM | POA: Diagnosis not present

## 2023-07-31 DIAGNOSIS — M25511 Pain in right shoulder: Secondary | ICD-10-CM | POA: Diagnosis not present

## 2023-07-31 DIAGNOSIS — M25512 Pain in left shoulder: Secondary | ICD-10-CM | POA: Diagnosis not present

## 2023-07-31 NOTE — Telephone Encounter (Signed)
 PA SUBMITTED VIA [employer] FOR MOUNJARO    Katherine Pancake (Key: [id number])   This request has been approved.  Please note any additional information provided by [employer] at the bottom of your screen.

## 2023-08-02 ENCOUNTER — Other Ambulatory Visit: Payer: Self-pay | Admitting: Internal Medicine

## 2023-08-02 ENCOUNTER — Encounter: Payer: Self-pay | Admitting: Physician Assistant

## 2023-08-02 DIAGNOSIS — Z1231 Encounter for screening mammogram for malignant neoplasm of breast: Secondary | ICD-10-CM

## 2023-08-17 ENCOUNTER — Other Ambulatory Visit: Payer: Self-pay | Admitting: Internal Medicine

## 2023-08-17 MED ORDER — LINACLOTIDE 145 MCG PO CAPS: ORAL_CAPSULE | ORAL | 1 refills | Status: DC

## 2023-08-23 DIAGNOSIS — M5416 Radiculopathy, lumbar region: Secondary | ICD-10-CM | POA: Diagnosis not present

## 2023-08-24 ENCOUNTER — Encounter: Payer: Self-pay | Admitting: Internal Medicine

## 2023-09-08 ENCOUNTER — Other Ambulatory Visit: Payer: Self-pay | Admitting: Internal Medicine

## 2023-09-08 ENCOUNTER — Other Ambulatory Visit (INDEPENDENT_AMBULATORY_CARE_PROVIDER_SITE_OTHER)

## 2023-09-08 DIAGNOSIS — E559 Vitamin D deficiency, unspecified: Secondary | ICD-10-CM | POA: Diagnosis not present

## 2023-09-08 DIAGNOSIS — E538 Deficiency of other specified B group vitamins: Secondary | ICD-10-CM

## 2023-09-08 DIAGNOSIS — K5909 Other constipation: Secondary | ICD-10-CM

## 2023-09-08 DIAGNOSIS — E782 Mixed hyperlipidemia: Secondary | ICD-10-CM

## 2023-09-08 DIAGNOSIS — I10 Essential (primary) hypertension: Secondary | ICD-10-CM

## 2023-09-08 DIAGNOSIS — E1169 Type 2 diabetes mellitus with other specified complication: Secondary | ICD-10-CM | POA: Diagnosis not present

## 2023-09-08 LAB — CBC WITH DIFFERENTIAL/PLATELET
Basophils Absolute: 0 10*3/uL (ref 0.0–0.1)
Basophils Relative: 0.6 % (ref 0.0–3.0)
Eosinophils Absolute: 0.1 10*3/uL (ref 0.0–0.7)
Eosinophils Relative: 1.6 % (ref 0.0–5.0)
HCT: 37.9 % (ref 36.0–46.0)
Hemoglobin: 12.7 g/dL (ref 12.0–15.0)
Lymphocytes Relative: 30.4 % (ref 12.0–46.0)
Lymphs Abs: 2.1 10*3/uL (ref 0.7–4.0)
MCHC: 33.5 g/dL (ref 30.0–36.0)
MCV: 85.2 fl (ref 78.0–100.0)
Monocytes Absolute: 0.6 10*3/uL (ref 0.1–1.0)
Monocytes Relative: 8.3 % (ref 3.0–12.0)
Neutro Abs: 4.1 10*3/uL (ref 1.4–7.7)
Neutrophils Relative %: 59.1 % (ref 43.0–77.0)
Platelets: 263 10*3/uL (ref 150.0–400.0)
RBC: 4.44 Mil/uL (ref 3.87–5.11)
RDW: 14 % (ref 11.5–15.5)
WBC: 6.9 10*3/uL (ref 4.0–10.5)

## 2023-09-08 LAB — COMPREHENSIVE METABOLIC PANEL WITH GFR
ALT: 21 U/L (ref 0–35)
AST: 21 U/L (ref 0–37)
Albumin: 4.1 g/dL (ref 3.5–5.2)
Alkaline Phosphatase: 96 U/L (ref 39–117)
BUN: 23 mg/dL (ref 6–23)
CO2: 29 meq/L (ref 19–32)
Calcium: 9.1 mg/dL (ref 8.4–10.5)
Chloride: 105 meq/L (ref 96–112)
Creatinine, Ser: 0.97 mg/dL (ref 0.40–1.20)
GFR: 66.58 mL/min (ref >60.00)
Glucose, Bld: 81 mg/dL (ref 70–99)
Potassium: 4.9 meq/L (ref 3.5–5.1)
Sodium: 138 meq/L (ref 135–145)
Total Bilirubin: 0.5 mg/dL (ref 0.2–1.2)
Total Protein: 6.8 g/dL (ref 6.0–8.3)

## 2023-09-08 LAB — MAGNESIUM: Magnesium: 2.3 mg/dL (ref 1.5–2.5)

## 2023-09-08 LAB — HEMOGLOBIN A1C: Hgb A1c MFr Bld: 5.5 % (ref 4.6–6.5)

## 2023-09-08 LAB — LIPID PANEL
Cholesterol: 171 mg/dL (ref 0–200)
HDL: 65.8 mg/dL (ref >39.00)
LDL Cholesterol: 94 mg/dL (ref 0–99)
NonHDL: 104.82
Total CHOL/HDL Ratio: 3
Triglycerides: 52 mg/dL (ref 0.0–149.0)
VLDL: 10.4 mg/dL (ref 0.0–40.0)

## 2023-09-08 LAB — VITAMIN B12: Vitamin B-12: 294 pg/mL (ref 211–911)

## 2023-09-08 LAB — VITAMIN D 25 HYDROXY (VIT D DEFICIENCY, FRACTURES): VITD: 41.54 ng/mL (ref 30.00–100.00)

## 2023-09-12 ENCOUNTER — Encounter: Payer: Self-pay | Admitting: Internal Medicine

## 2023-09-13 ENCOUNTER — Other Ambulatory Visit (INDEPENDENT_AMBULATORY_CARE_PROVIDER_SITE_OTHER): Payer: Self-pay

## 2023-09-13 ENCOUNTER — Encounter (INDEPENDENT_AMBULATORY_CARE_PROVIDER_SITE_OTHER): Payer: Self-pay | Admitting: Family Medicine

## 2023-09-13 DIAGNOSIS — F3289 Other specified depressive episodes: Secondary | ICD-10-CM

## 2023-09-13 MED ORDER — BUPROPION HCL ER (SR) 100 MG PO TB12: 100.0000 mg | ORAL_TABLET | Freq: Two times a day (BID) | ORAL | 0 refills | Status: DC

## 2023-09-14 ENCOUNTER — Ambulatory Visit: Payer: Self-pay | Admitting: Internal Medicine

## 2023-09-14 LAB — TSH: TSH: 2.67 u[IU]/mL (ref 0.35–5.50)

## 2023-09-14 NOTE — Telephone Encounter (Signed)
 Upon review of the actual message, pt has concerns about her meds and also, needs all of them RFed by us . It has been several wks since she was last seen by Dr Alvia Awkward.   However, I highly recommend she come in sooner to see WHOMEVER has the first appt to accommodate her, so she can discuss her medication  concerns and we can render appropriate MEDICAL ADVICE AND CARE.    PLEASE CALL AND NOTIFY PT OF OUR RECS.   Remind her Dr B returns to the clinic next week if she wishes to address with her.  Jamse Mcgee- this is the one we discussed.  thnx

## 2023-09-18 ENCOUNTER — Other Ambulatory Visit (INDEPENDENT_AMBULATORY_CARE_PROVIDER_SITE_OTHER): Payer: Self-pay | Admitting: Family Medicine

## 2023-09-18 ENCOUNTER — Ambulatory Visit

## 2023-09-18 DIAGNOSIS — F3289 Other specified depressive episodes: Secondary | ICD-10-CM

## 2023-09-18 DIAGNOSIS — E559 Vitamin D deficiency, unspecified: Secondary | ICD-10-CM

## 2023-09-18 DIAGNOSIS — E1169 Type 2 diabetes mellitus with other specified complication: Secondary | ICD-10-CM

## 2023-09-18 MED ORDER — TIRZEPATIDE 15 MG/0.5ML ~~LOC~~ SOAJ: 15.0000 mg | SUBCUTANEOUS | 0 refills | Status: DC

## 2023-09-18 MED ORDER — TOPIRAMATE 50 MG PO TABS
50.0000 mg | ORAL_TABLET | Freq: Every day | ORAL | 0 refills | Status: DC
Start: 2023-09-18 — End: 2023-11-03

## 2023-09-18 MED ORDER — VITAMIN D (ERGOCALCIFEROL) 1.25 MG (50000 UNIT) PO CAPS
50000.0000 [IU] | ORAL_CAPSULE | ORAL | 0 refills | Status: DC
Start: 2023-09-18 — End: 2023-11-06

## 2023-09-18 MED ORDER — METFORMIN HCL 500 MG PO TABS: 500.0000 mg | ORAL_TABLET | Freq: Two times a day (BID) | ORAL | 0 refills | Status: DC

## 2023-09-21 ENCOUNTER — Ambulatory Visit (INDEPENDENT_AMBULATORY_CARE_PROVIDER_SITE_OTHER): Admitting: Family Medicine

## 2023-09-26 ENCOUNTER — Ambulatory Visit
Admission: RE | Admit: 2023-09-26 | Discharge: 2023-09-26 | Disposition: A | Discharge status: Home / Self Care | Source: Ambulatory Visit | Attending: Internal Medicine

## 2023-09-26 ENCOUNTER — Ambulatory Visit

## 2023-09-26 DIAGNOSIS — Z1231 Encounter for screening mammogram for malignant neoplasm of breast: Secondary | ICD-10-CM | POA: Diagnosis not present

## 2023-09-27 ENCOUNTER — Ambulatory Visit: Admitting: Physician Assistant

## 2023-09-27 ENCOUNTER — Encounter: Payer: Self-pay | Admitting: Physician Assistant

## 2023-09-27 VITALS — BP 160/70 | HR 83 | Ht 68.0 in | Wt 208.0 lb

## 2023-09-27 DIAGNOSIS — R1013 Epigastric pain: Secondary | ICD-10-CM

## 2023-09-27 DIAGNOSIS — K219 Gastro-esophageal reflux disease without esophagitis: Secondary | ICD-10-CM

## 2023-09-27 DIAGNOSIS — K581 Irritable bowel syndrome with constipation: Secondary | ICD-10-CM | POA: Diagnosis not present

## 2023-09-27 MED ORDER — LUBIPROSTONE 24 MCG PO CAPS: 24.0000 ug | ORAL_CAPSULE | Freq: Two times a day (BID) | ORAL | 3 refills | Status: DC

## 2023-09-27 MED ORDER — FAMOTIDINE 20 MG PO TABS: 20.0000 mg | ORAL_TABLET | Freq: Two times a day (BID) | ORAL | 3 refills | Status: DC

## 2023-09-27 NOTE — Patient Instructions (Signed)
 We have sent the following medications to your pharmacy for you to pick up at your convenience: Amitiza 24 mcg twice daily  Increase Famotidine  20 mg twice daily.   _______________________________________________________  If your blood pressure at your visit was 140/90 or greater, please contact your primary care physician to follow up on this.  _______________________________________________________  If you are age 54 or older, your body mass index should be between 23-30. Your Body mass index is 31.63 kg/m. If this is out of the aforementioned range listed, please consider follow up with your Primary Care Provider.  If you are age 46 or younger, your body mass index should be between 19-25. Your Body mass index is 31.63 kg/m. If this is out of the aformentioned range listed, please consider follow up with your Primary Care Provider.   ________________________________________________________  The [facility] providers would like to encourage you to use MYCHART to communicate with providers for non-urgent requests or questions.  Due to long hold times on the telephone, sending your provider a message by [facility] may be a faster and more efficient way to get a response.  Please allow 48 business hours for a response.  Please remember that this is for non-urgent requests.  _______________________________________________________

## 2023-09-27 NOTE — Progress Notes (Signed)
 Chief Complaint: Epigastric burning and IBS-C  HPI:    [name] is a 53 year old female with past medical history as listed below including IBS-C, previously known to Dr. Aneita, who was referred to me by Geofm Glade PARAS, MD for a complaint of IBS-C and epigastric burning    11/12/2012 patient seen in clinic for IBS.  At that time diarrhea predominant.  She was continued on Glycopyrrolate  2 mg twice a day.  Continued on Meprazole 40 for reflux.    07/15/2019 colonoscopy was normal.  Repeat recommended 10 years.    09/08/2023 labs including CBC, CMP, hemoglobin A1c, B12, vitamin D , magnesium  and TSH all normal.    Today, patient presents to clinic and explains that ever since being on Mounjaro  this has messed up her IBS.  She has been severely constipated regardless of taking her Linzess  145 mcg daily.  Recently she was told by her PCP to increase this to 290 mcg so she is taking 2 and also started some prune lax (looking at ingredients looks like it has senna and prunes) which works well for her.  She uses this once a day on top of her Linzess  and is having daily bowel movements but tells me the prune lax is expensive for her so she would like to see if there is another option.  She does not want to stop the Mounjaro  which she knows is likely causing her symptoms.    Also describes taking Famotidine  20 mg at night.  Tells me that the 2 days after she takes her Mounjaro  dose that she typically has a lot of epigastric burning and pain and would like to know what else she can do.    Denies fever, chills or blood in her stool.  Past Medical History:  Diagnosis Date   Anemia    Anxiety    Arthritis    inflammitory   Asthma    B12 deficiency    Back pain    Chronic joint pain    Constipation    DDD (degenerative disc disease), lumbar    low back and neck and shoulders   Depression    during divorce & legal matters   Fatty liver    Fibromyalgia    Gallbladder problem    GERD  (gastroesophageal reflux disease)    History of kidney stones    Dr rosalynn Likens   History of polycystic ovarian disease S/P BSO   Hx of anxiety disorder    Hypertension    a   Hypothyroidism    no meds   IBS (irritable bowel syndrome)    IBS (irritable bowel syndrome)    Infertility, female    Joint pain    Kidney problem    Lactose intolerance    Lupus hx positive ANA   followed by Dr Jon Jacob; possible lupus   Multiple food allergies    Myalgia    Osteoarthritis    Polyarthritis, 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HCC)    POLYCYSTIC OVARIAN DISEASE 03/02/2007   Qualifier: Diagnosis of  By: Tish MD, Elsie POUR BSO for cysts & endometriosis; Dr Thyra, Gyn Seeing Dr Nathanel Bunker     PONV (postoperative nausea and vomiting)    Pre-diabetes    Prediabetes    Sweating profusely    Symptomatic mammary hypertrophy    URI (upper respiratory infection)    currently on cefdinir     Past Surgical History:  Procedure Laterality Date   ABDOMINAL HYSTERECTOMY     ANKLE  SURGERY     X3   APPENDECTOMY  1991   exploratory lap   BACK SURGERY     BREAST REDUCTION SURGERY Bilateral 03/26/2015   Procedure: BILATERAL BREAST REDUCTION ;  Surgeon: Estefana GORMAN Fritter, DO;  Location: [facility];  Service: Plastics;  Laterality: Bilateral;   CARPAL TUNNEL RELEASE     bilateral; Dr Alix   COLONOSCOPY     in 1990s   CYSTO/ RIGHT RETROGRADE URETERAL PYELOGRAM  07/09/2004   HX BILATERAL RENAL STONES/ RIGHT FLANK PAIN   CYSTOSCOPY W/ URETERAL STENT PLACEMENT  11/12/2011   Procedure: CYSTOSCOPY WITH RETROGRADE PYELOGRAM/URETERAL STENT PLACEMENT;  Surgeon: Ricardo Likens, MD;  Location: [facility];  Service: Urology;  Laterality: Right;   CYSTOSCOPY W/ URETERAL STENT REMOVAL  12/07/2011   Procedure: CYSTOSCOPY WITH STENT REMOVAL;  Surgeon: Ricardo Likens, MD;  Location: [facility];  Service: Urology;  Laterality: Right;   CYSTOSCOPY/RETROGRADE/URETEROSCOPY/STONE EXTRACTION  WITH BASKET  12/07/2011   Procedure: CYSTOSCOPY/RETROGRADE/URETEROSCOPY/STONE EXTRACTION WITH BASKET;  Surgeon: Ricardo Likens, MD;  Location: [facility];  Service: Urology;  Laterality: Right;   DILATION AND CURETTAGE OF UTERUS     X5   EXCISION LEFT BARTHOLIN GLAND  02/05/2002   EYE SURGERY     Retinal tears surgery bilateral   LAPAROSCOPIC ASSISTED VAGINAL HYSTERECTOMY  2000   LAPAROSCOPIC CHOLECYSTECTOMY  05/31/2007   LAPAROSCOPIC LASER ABLATION ENDOMETRIOSIS AND LEFT SALPINGO-OOPHORECTOMY  11/03/1999   LAPAROSCOPY WITH RIGHT SALPINGO-OOPHECTOMY/ LYSIS ADHESIONS AND ABLATION ENDOMETRIOSIS  05/17/2001   LIPOSUCTION Bilateral 03/26/2015   Procedure: WITH LIPOSUCTION;  Surgeon: Estefana GORMAN Fritter, DO;  Location: [facility];  Service: Plastics;  Laterality: Bilateral;   LUMBAR LAMINECTOMY  2003   L4 - L5   NECK SURGERY  2017   PLANTAR FASCIA SURGERY     right   PLANTAR FASCIA SURGERY  02/2011   left   REDUCTION MAMMAPLASTY  2016   RIGHT URETEROSCOPIC STONE EXTRACTION  08/04/2000   SHOULDER ARTHROSCOPY WITH ROTATOR CUFF REPAIR Right 05/11/2020   Procedure: SHOULDER ARTHROSCOPY WITH ROTATOR CUFF REPAIR;  Surgeon: Leora Lynwood SAUNDERS, MD;  Location: [facility];  Service: Orthopedics;  Laterality: Right;   TONSILLECTOMY     TOTAL HIP ARTHROPLASTY Left 01/11/2022   Procedure: TOTAL HIP ARTHROPLASTY ANTERIOR APPROACH;  Surgeon: Leora Lynwood SAUNDERS, MD;  Location: [facility];  Service: Orthopedics;  Laterality: Left;   URETERAL STENT PLACEMENT  11/12/2011   Dr Likens    Current Outpatient Medications  Medication Sig Dispense Refill   acetaminophen  (TYLENOL ) 650 MG CR tablet Take 1,300 mg by mouth every 8 (eight) hours as needed for pain.     aspirin  81 MG chewable tablet Chew 1 tablet (81 mg total) by mouth 2 (two) times daily. 60 tablet 0   celecoxib  (CELEBREX ) 100 MG capsule Take 100 mg by mouth daily.     citalopram  (CELEXA ) 40 MG tablet Take 1 tablet (40 mg total)  by mouth daily. 90 tablet 1   diphenhydramine -acetaminophen  (TYLENOL  PM) 25-500 MG TABS tablet Take 2 tablets by mouth at bedtime.     famotidine  (PEPCID ) 20 MG tablet TAKE 1 TABLET BY MOUTH EVERYDAY AT BEDTIME 90 tablet 1   linaclotide  (LINZESS ) 145 MCG CAPS capsule TAKE 1 CAPSULE BY MOUTH DAILY BEFORE BREAKFAST. 90 capsule 1   lisinopril  (ZESTRIL ) 20 MG tablet TAKE 1 TABLET BY MOUTH EVERY DAY 90 tablet 1   Melatonin 5 MG CHEW Chew 1 tablet by mouth as needed.  SKYRIZI PEN 150 MG/ML SOAJ Inject into the skin.     tirzepatide  (MOUNJARO ) 15 MG/0.5ML Pen Inject 15 mg into the skin once a week. 6 mL 0   Vitamin D , Ergocalciferol , (DRISDOL ) 1.25 MG (50000 UNIT) CAPS capsule Take 1 capsule (50,000 Units total) by mouth every 7 (seven) days. 13 capsule 0   albuterol  (VENTOLIN  HFA) 108 (90 Base) MCG/ACT inhaler Inhale 2 puffs into the lungs every 6 (six) hours as needed for wheezing or shortness of breath. TAKE 2 PUFFS BY MOUTH EVERY 6 HOURS AS NEEDED FOR WHEEZE OR SHORTNESS OF BREATH (Patient not taking: Reported on 09/27/2023) 8.5 g 5   buPROPion  ER (WELLBUTRIN  SR) 100 MG 12 hr tablet Take 1 tablet (100 mg total) by mouth 2 (two) times daily. (Patient not taking: Reported on 09/27/2023) 60 tablet 0   hydrochlorothiazide  (HYDRODIURIL ) 25 MG tablet Take 25 mg by mouth daily. (Patient not taking: Reported on 09/27/2023)     metFORMIN  (GLUCOPHAGE ) 500 MG tablet Take 1 tablet (500 mg total) by mouth 2 (two) times daily with a meal. (Patient not taking: Reported on 09/27/2023) 180 tablet 0   topiramate  (TOPAMAX ) 50 MG tablet Take 1 tablet (50 mg total) by mouth daily. (Patient not taking: Reported on 09/27/2023) 30 tablet 0   No current facility-administered medications for this visit.    Allergies as of 09/27/2023 - Review Complete 09/27/2023  Allergen Reaction Noted   Chlorhexidine  gluconate Rash, Itching, and Swelling 04/17/2015   Ivp dye [iodinated contrast media] Rash and Other (See Comments) 06/13/2011    Nalbuphine Shortness Of Breath and Rash    Septra [sulfamethoxazole-trimethoprim] Shortness Of Breath and Rash 01/23/2013   Adhesive [tape] Rash 04/30/2019   Cymbalta  [duloxetine  hcl]  10/29/2021   Diclofenac Other (See Comments) 01/24/2019   Humira [adalimumab]  02/16/2021   Methotrexate derivatives  02/16/2021   Augmentin  [amoxicillin -pot clavulanate] Nausea And Vomiting 01/22/2018   Betadine [povidone iodine] Rash 04/21/2020   Latex Rash 04/21/2020   Otezla [apremilast] Rash 12/30/2021    Family History  Problem Relation Age of Onset   Hypertension Mother    Colon polyps Mother    Atrial fibrillation Mother        Dr Verlin   Hyperlipidemia Mother    Heart disease Mother    Anxiety disorder Mother    Sleep apnea Mother    Obesity Mother    Heart attack Father 80   Diabetes Father    Hypertension Father    Hyperlipidemia Father    Heart disease Father    Stroke Father    Obesity Father    Hypertension Sister    Heart attack Paternal Grandmother 40   Breast cancer Paternal Aunt 37   Colon cancer Maternal Uncle    Colon cancer Paternal Uncle    Colon polyps Maternal Grandmother    Stroke Maternal Grandmother 75   Diabetes Paternal Grandfather    Stroke Maternal Grandfather 37    Social History   Socioeconomic History   Marital status: Married    Spouse name: Caron   Number of children: 0   Years of education: college   Highest education level: Some college, no degree  Occupational History   Occupation: disabled   Occupation: disabled  Tobacco Use   Smoking status: Never    Passive exposure: Never   Smokeless tobacco: Never  Vaping Use   Vaping status: Never Used  Substance and Sexual Activity   Alcohol use: Yes    Comment: rarely  Drug use: No   Sexual activity: Yes    Birth control/protection: Surgical  Other Topics Concern   Not on file  Social History Narrative   Patient Lives at home with her husband Sandor)   Disabled.   Education two  years of college.   Right handed.   Caffeine coffee and sweet tea. Not daily.   Social Drivers of Corporate investment banker Strain: Low Risk  (06/30/2023)   Overall Financial Resource Strain (CARDIA)    Difficulty of Paying Living Expenses: Not hard at all  Food Insecurity: No Food Insecurity (06/30/2023)   Hunger Vital Sign    Worried About Running Out of Food in the Last Year: Never true    Ran Out of Food in the Last Year: Never true  Transportation Needs: No Transportation Needs (06/30/2023)   PRAPARE - Administrator, Civil Service (Medical): No    Lack of Transportation (Non-Medical): No  Physical Activity: Insufficiently Active (06/30/2023)   Exercise Vital Sign    Days of Exercise per Week: 5 days    Minutes of Exercise per Session: 10 min  Stress: Stress Concern Present (06/30/2023)   [employer] of Occupational Health - Occupational Stress Questionnaire    Feeling of Stress : Rather much  Social Connections: Socially Integrated (06/30/2023)   Social Connection and Isolation Panel    Frequency of Communication with Friends and Family: More than three times a week    Frequency of Social Gatherings with Friends and Family: More than three times a week    Attends Religious Services: More than 4 times per year    Active Member of [employer] or Organizations: Yes    Attends Engineer, structural: More than 4 times per year    Marital Status: Married  Catering manager Violence: Not At Risk (06/30/2023)   Humiliation, Afraid, Rape, and Kick questionnaire    Fear of Current or Ex-Partner: No    Emotionally Abused: No    Physically Abused: No    Sexually Abused: No    Review of Systems:    Constitutional: No weight loss, fever or chills Skin: No rash  Cardiovascular: No chest pain  Respiratory: No SOB  Gastrointestinal: See HPI and otherwise negative Genitourinary: No dysuria  Neurological: No headache, dizziness or syncope Musculoskeletal: No new muscle  or joint pain Hematologic: No bleeding  Psychiatric: No history of depression or anxiety   Physical Exam:  Vital signs: BP (!) 160/70   Pulse 83   Ht 5' 8 (1.727 m)   Wt 208 lb (94.3 kg)   BMI 31.63 kg/m    Constitutional:   Pleasant Caucasian female appears to be in NAD, Well developed, Well nourished, alert and cooperative Head:  Normocephalic and atraumatic. Eyes:   PEERL, EOMI. No icterus. Conjunctiva pink. Ears:  Normal auditory acuity. Neck:  Supple Throat: Oral cavity and pharynx without inflammation, swelling or lesion.  Respiratory: Respirations even and unlabored. Lungs clear to auscultation bilaterally.   No wheezes, crackles, or rhonchi.  Cardiovascular: Normal S1, S2. No MRG. Regular rate and rhythm. No peripheral edema, cyanosis or pallor.  Gastrointestinal:  Soft, nondistended, nontender. No rebound or guarding. Normal bowel sounds. No appreciable masses or hepatomegaly. Rectal:  Not performed.  Msk:  Symmetrical without gross deformities. Without edema, no deformity or joint abnormality.  Neurologic:  Alert and  oriented x4;  grossly normal neurologically.  Skin:   Dry and intact without significant lesions or rashes. Psychiatric: Demonstrates good judgement  and reason without abnormal affect or behaviors.  RELEVANT LABS AND IMAGING: CBC    Component Value Date/Time   WBC 6.9 09/08/2023 0959   RBC 4.44 09/08/2023 0959   HGB 12.7 09/08/2023 0959   HGB 12.1 12/09/2021 1442   HCT 37.9 09/08/2023 0959   HCT 37.1 12/09/2021 1442   PLT 263.0 09/08/2023 0959   PLT 276 12/09/2021 1442   MCV 85.2 09/08/2023 0959   MCV 87 12/09/2021 1442   MCH 28.3 12/09/2021 1442   MCH 27.0 04/19/2015 0650   MCHC 33.5 09/08/2023 0959   RDW 14.0 09/08/2023 0959   RDW 13.4 12/09/2021 1442   LYMPHSABS 2.1 09/08/2023 0959   LYMPHSABS 2.1 12/09/2021 1442   MONOABS 0.6 09/08/2023 0959   EOSABS 0.1 09/08/2023 0959   EOSABS 0.2 12/09/2021 1442   BASOSABS 0.0 09/08/2023 0959    BASOSABS 0.1 12/09/2021 1442    CMP     Component Value Date/Time   NA 138 09/08/2023 0959   NA 143 04/19/2023 1134   K 4.9 09/08/2023 0959   CL 105 09/08/2023 0959   CO2 29 09/08/2023 0959   GLUCOSE 81 09/08/2023 0959   BUN 23 09/08/2023 0959   BUN 19 04/19/2023 1134   CREATININE 0.97 09/08/2023 0959   CALCIUM 9.1 09/08/2023 0959   PROT 6.8 09/08/2023 0959   PROT 6.8 04/19/2023 1134   ALBUMIN 4.1 09/08/2023 0959   ALBUMIN 4.2 04/19/2023 1134   AST 21 09/08/2023 0959   ALT 21 09/08/2023 0959   ALKPHOS 96 09/08/2023 0959   BILITOT 0.5 09/08/2023 0959   BILITOT 0.3 04/19/2023 1134   GFRNONAA 109 11/11/2019 1049   GFRAA 125 11/11/2019 1049    Assessment: 1.  IBS-C: Diagnosed at the age of 61, currently on Linzess  increased to 290 mcg with addition of Mounjaro  to her medication regimen, also requiring prune lax, up-to-date on colonoscopy 2.  Epigastric pain: Likely GERD/gastritis, worse the 2 days after her Mounjaro  dosing, also on Famotidine  20 mg nightly  Plan: 1.  Discussed with patient that it is likely her Mounjaro  causing symptoms.  She would prefer to stay on this as this has given her significant weight loss and is controlling her symptoms. 2.  Discussed that we could trial Amitiza 24 mcg twice a day with food #60 with 3 refills 3.  Patient will discontinue Linzess  while using the Amitiza.  If this is not helpful for her then would refill Linzess  at 290 mcg daily and she can continue daily prune lax in addition while using Mounjaro . 4.  Also discussed epigastric burning, most likely this is gastritis from Mounjaro .  She can use Famotidine  20 mg every morning on those days which sounds like it is only a couple of days a week.  Refilled her Famotidine  20 mg for twice daily #60 with 5 refills 5.  Patient request to be assigned to Dr. Shila. 6.  Patient to follow in clinic with us  as needed.  She will message me through MyChart to let me know if the Amitiza is  working.  Delon Failing, PA-C [facility] 09/27/2023, 1:58 PM  Cc: Geofm Glade PARAS, MD

## 2023-09-28 ENCOUNTER — Ambulatory Visit

## 2023-10-06 IMAGING — MG MM DIGITAL SCREENING BILAT W/ TOMO AND CAD
8 series · 8 of 24 positions shown · non-contrast
Comparison: Previous exam(s).

ACR Breast Density Category a: The breast tissue is almost entirely
fatty.

CLINICAL DATA: Screening.

EXAM:
DIGITAL SCREENING BILATERAL MAMMOGRAM WITH TOMOSYNTHESIS AND CAD
TECHNIQUE: Bilateral screening digital craniocaudal and mediolateral oblique
mammograms were obtained. Bilateral screening digital breast
tomosynthesis was performed. The images were evaluated with
computer-aided detection.

[R MLO synth-2D]
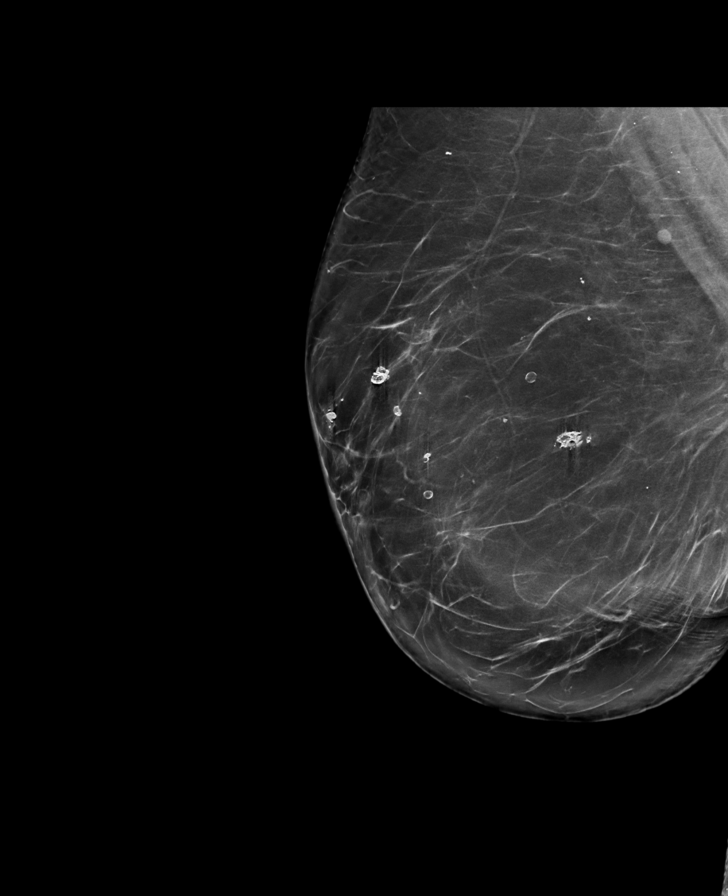

[L CC synth-2D]
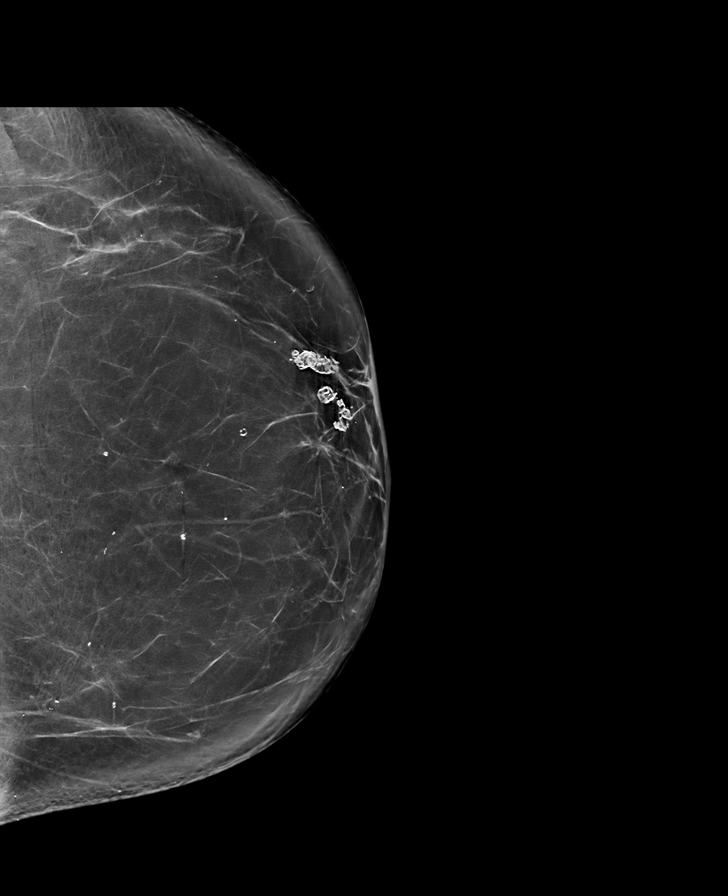

[R CC synth-2D]
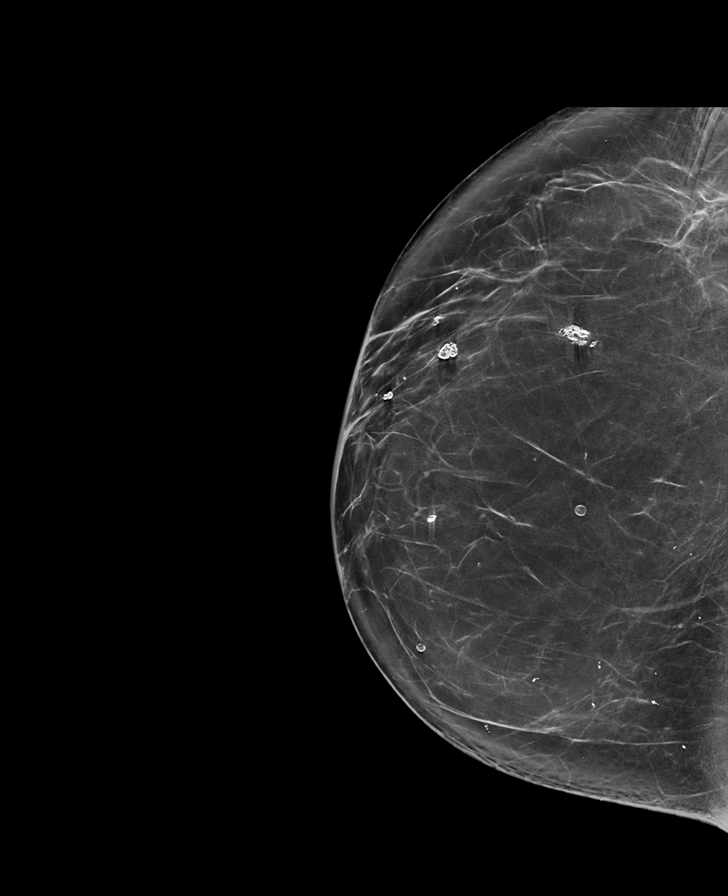

[L MLO synth-2D]
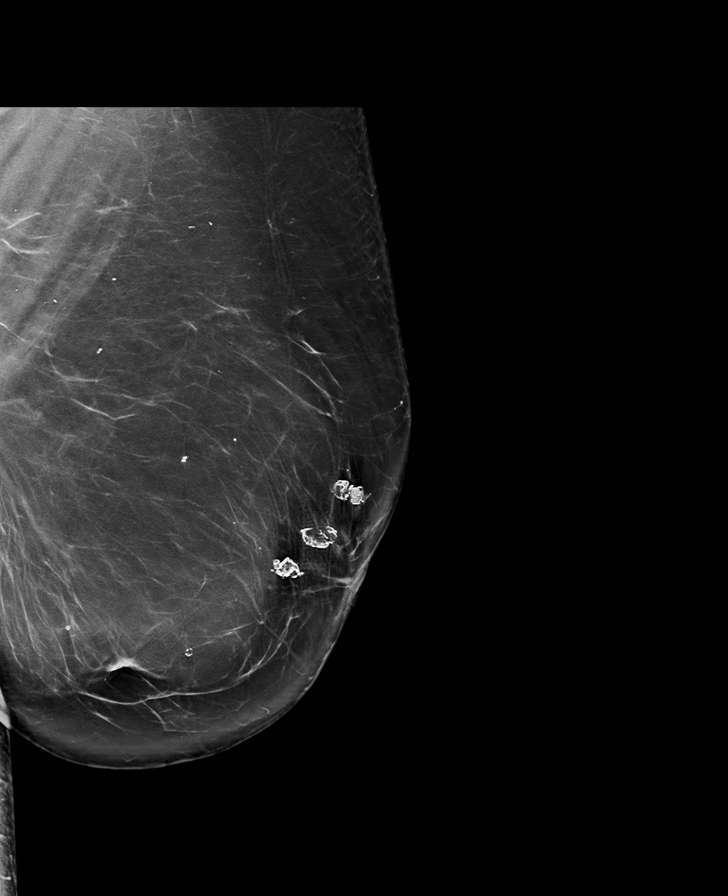

[L MLO tomo · tomo slice 49/97.0]
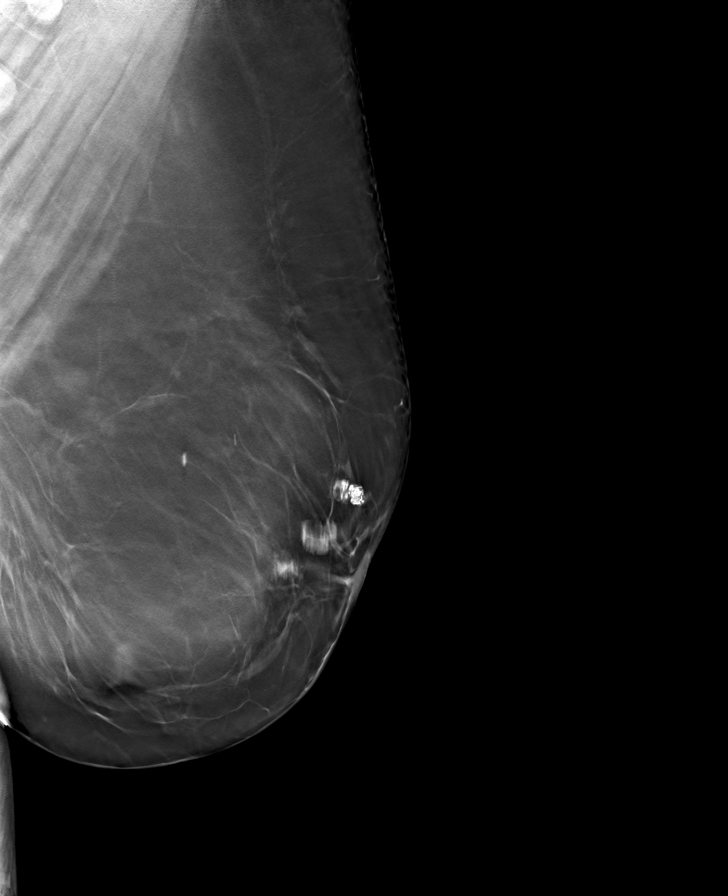

[R CC tomo · tomo slice 42/83.0]
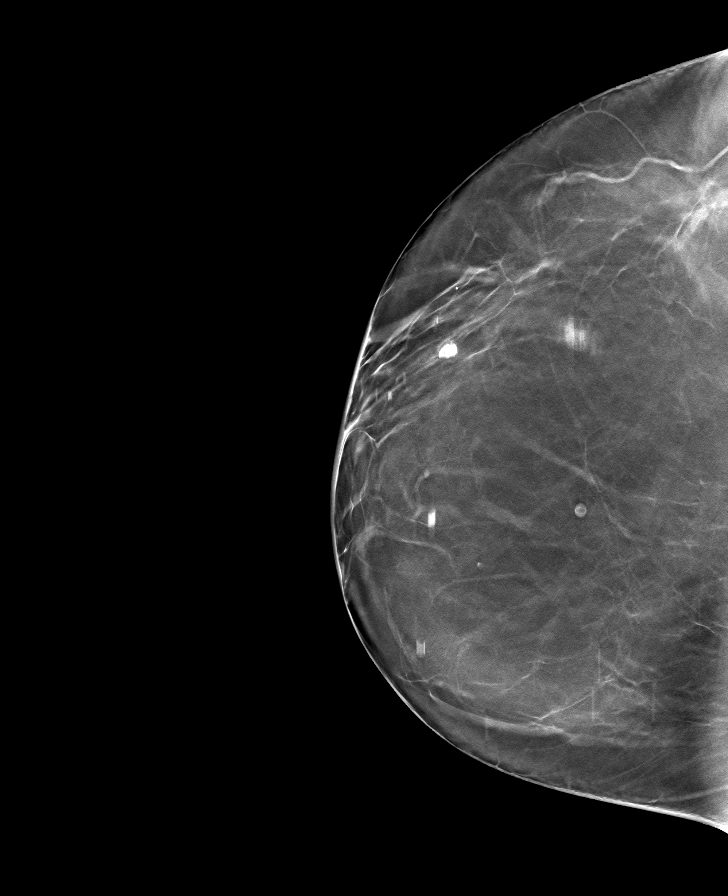

[R MLO tomo · tomo slice 45/90.0]
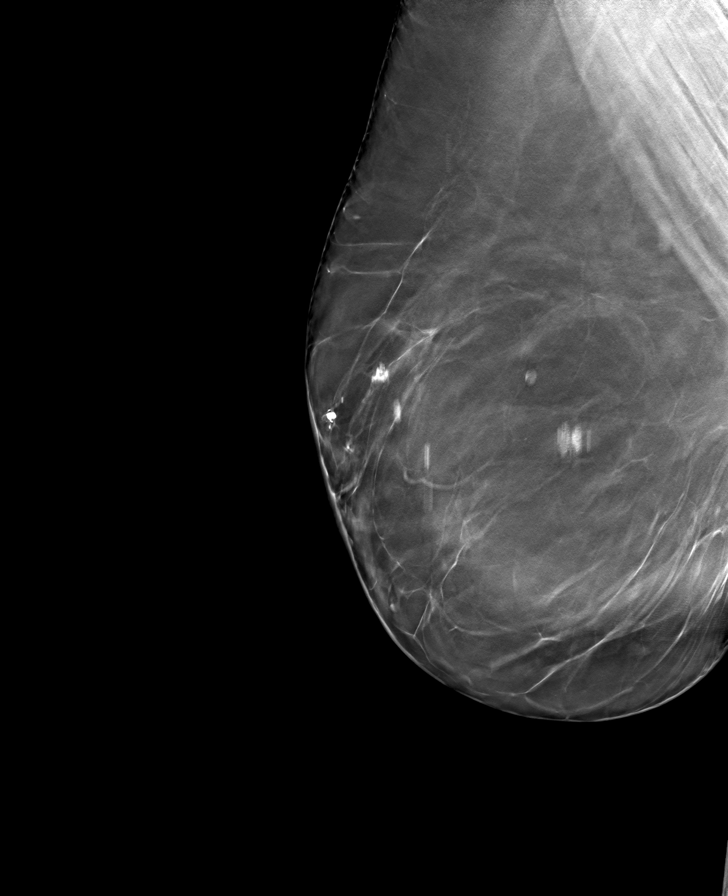

[L CC tomo · tomo slice 45/88.0]
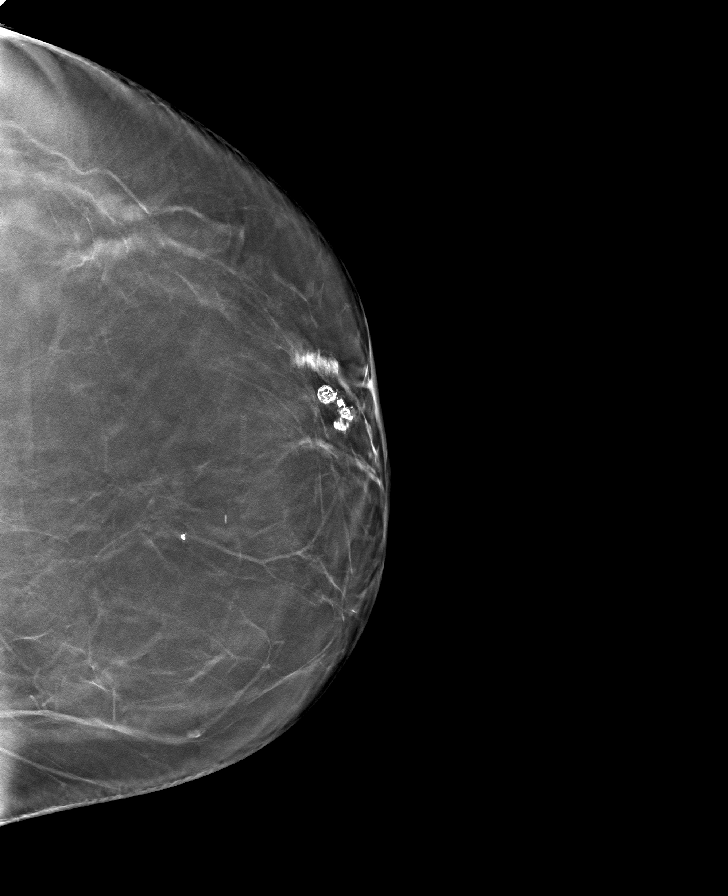

[8 of 24 positions shown; findings below may reference images not displayed]

FINDINGS: There are no findings suspicious for malignancy.
IMPRESSION: No mammographic evidence of malignancy. A result letter of this
screening mammogram will be mailed directly to the patient.

RECOMMENDATION:
Screening mammogram in one year. (Code:0E-3-N98)

BI-RADS CATEGORY  1: Negative.

## 2023-10-11 ENCOUNTER — Other Ambulatory Visit (INDEPENDENT_AMBULATORY_CARE_PROVIDER_SITE_OTHER): Payer: Self-pay | Admitting: Family Medicine

## 2023-10-11 DIAGNOSIS — F3289 Other specified depressive episodes: Secondary | ICD-10-CM

## 2023-10-16 DIAGNOSIS — M1712 Unilateral primary osteoarthritis, left knee: Secondary | ICD-10-CM | POA: Diagnosis not present

## 2023-10-16 DIAGNOSIS — M150 Primary generalized (osteo)arthritis: Secondary | ICD-10-CM | POA: Diagnosis not present

## 2023-10-16 DIAGNOSIS — L4050 Arthropathic psoriasis, unspecified: Secondary | ICD-10-CM | POA: Diagnosis not present

## 2023-10-16 DIAGNOSIS — L409 Psoriasis, unspecified: Secondary | ICD-10-CM | POA: Diagnosis not present

## 2023-10-16 DIAGNOSIS — Z7960 Long term (current) use of unspecified immunomodulators and immunosuppressants: Secondary | ICD-10-CM | POA: Diagnosis not present

## 2023-10-16 DIAGNOSIS — M791 Myalgia, unspecified site: Secondary | ICD-10-CM | POA: Diagnosis not present

## 2023-10-28 DIAGNOSIS — Z79899 Other long term (current) drug therapy: Secondary | ICD-10-CM | POA: Diagnosis not present

## 2023-10-30 DIAGNOSIS — M7062 Trochanteric bursitis, left hip: Secondary | ICD-10-CM | POA: Diagnosis not present

## 2023-10-30 DIAGNOSIS — M25512 Pain in left shoulder: Secondary | ICD-10-CM | POA: Diagnosis not present

## 2023-11-02 ENCOUNTER — Other Ambulatory Visit: Payer: Self-pay

## 2023-11-02 ENCOUNTER — Telehealth (HOSPITAL_COMMUNITY): Payer: Self-pay | Admitting: Pharmacy Technician

## 2023-11-02 ENCOUNTER — Observation Stay
Admission: EM | Admit: 2023-11-02 | Discharge: 2023-11-03 | Disposition: A | Discharge status: Home / Self Care | Attending: Hospitalist | Admitting: Hospitalist

## 2023-11-02 ENCOUNTER — Encounter: Payer: Self-pay | Admitting: Internal Medicine

## 2023-11-02 ENCOUNTER — Other Ambulatory Visit (HOSPITAL_COMMUNITY): Payer: Self-pay

## 2023-11-02 ENCOUNTER — Emergency Department

## 2023-11-02 ENCOUNTER — Observation Stay (HOSPITAL_BASED_OUTPATIENT_CLINIC_OR_DEPARTMENT_OTHER)
Admit: 2023-11-02 | Discharge: 2023-11-02 | Disposition: A | Discharge status: Home / Self Care | Attending: Cardiovascular Disease | Admitting: Cardiovascular Disease

## 2023-11-02 DIAGNOSIS — R000 Tachycardia, unspecified: Secondary | ICD-10-CM | POA: Diagnosis not present

## 2023-11-02 DIAGNOSIS — J45909 Unspecified asthma, uncomplicated: Secondary | ICD-10-CM | POA: Diagnosis not present

## 2023-11-02 DIAGNOSIS — E119 Type 2 diabetes mellitus without complications: Secondary | ICD-10-CM | POA: Insufficient documentation

## 2023-11-02 DIAGNOSIS — E039 Hypothyroidism, unspecified: Secondary | ICD-10-CM | POA: Insufficient documentation

## 2023-11-02 DIAGNOSIS — F419 Anxiety disorder, unspecified: Secondary | ICD-10-CM | POA: Diagnosis not present

## 2023-11-02 DIAGNOSIS — F32A Depression, unspecified: Secondary | ICD-10-CM | POA: Diagnosis not present

## 2023-11-02 DIAGNOSIS — R7303 Prediabetes: Secondary | ICD-10-CM

## 2023-11-02 DIAGNOSIS — I480 Paroxysmal atrial fibrillation: Secondary | ICD-10-CM | POA: Diagnosis not present

## 2023-11-02 DIAGNOSIS — I499 Cardiac arrhythmia, unspecified: Secondary | ICD-10-CM | POA: Diagnosis not present

## 2023-11-02 DIAGNOSIS — I4891 Unspecified atrial fibrillation: Secondary | ICD-10-CM

## 2023-11-02 DIAGNOSIS — L932 Other local lupus erythematosus: Secondary | ICD-10-CM | POA: Insufficient documentation

## 2023-11-02 DIAGNOSIS — D649 Anemia, unspecified: Secondary | ICD-10-CM | POA: Diagnosis not present

## 2023-11-02 DIAGNOSIS — I10 Essential (primary) hypertension: Secondary | ICD-10-CM | POA: Insufficient documentation

## 2023-11-02 DIAGNOSIS — E669 Obesity, unspecified: Secondary | ICD-10-CM | POA: Diagnosis not present

## 2023-11-02 DIAGNOSIS — R002 Palpitations: Secondary | ICD-10-CM | POA: Diagnosis not present

## 2023-11-02 DIAGNOSIS — K219 Gastro-esophageal reflux disease without esophagitis: Secondary | ICD-10-CM | POA: Diagnosis not present

## 2023-11-02 DIAGNOSIS — M797 Fibromyalgia: Secondary | ICD-10-CM | POA: Diagnosis not present

## 2023-11-02 DIAGNOSIS — R079 Chest pain, unspecified: Secondary | ICD-10-CM | POA: Diagnosis not present

## 2023-11-02 DIAGNOSIS — R457 State of emotional shock and stress, unspecified: Secondary | ICD-10-CM | POA: Diagnosis not present

## 2023-11-02 DIAGNOSIS — K589 Irritable bowel syndrome without diarrhea: Secondary | ICD-10-CM | POA: Diagnosis not present

## 2023-11-02 HISTORY — DX: Other ill-defined heart diseases: I51.89

## 2023-11-02 HISTORY — DX: Chest pain, unspecified: R07.9

## 2023-11-02 LAB — CBG MONITORING, ED
Glucose-Capillary: 101 mg/dL — ABNORMAL HIGH (ref 70–99)
Glucose-Capillary: 132 mg/dL — ABNORMAL HIGH (ref 70–99)

## 2023-11-02 LAB — CBC
HCT: 38.3 % (ref 36.0–46.0)
Hemoglobin: 12.5 g/dL (ref 12.0–15.0)
MCH: 28.2 pg (ref 26.0–34.0)
MCHC: 32.6 g/dL (ref 30.0–36.0)
MCV: 86.5 fL (ref 80.0–100.0)
Platelets: 259 K/uL (ref 150–400)
RBC: 4.43 MIL/uL (ref 3.87–5.11)
RDW: 13.9 % (ref 11.5–15.5)
WBC: 12 K/uL — ABNORMAL HIGH (ref 4.0–10.5)
nRBC: 0 % (ref 0.0–0.2)

## 2023-11-02 LAB — ECHOCARDIOGRAM COMPLETE
AR max vel: 2.21 cm2
AV Area VTI: 2.48 cm2
AV Area mean vel: 2.34 cm2
AV Mean grad: 4.5 mmHg
AV Peak grad: 8.2 mmHg
Ao pk vel: 1.44 m/s
Area-P 1/2: 3.79 cm2
Height: 68 in
S' Lateral: 2.8 cm
Weight: 3008 [oz_av]

## 2023-11-02 LAB — BASIC METABOLIC PANEL WITH GFR
Anion gap: 10 (ref 5–15)
BUN: 23 mg/dL — ABNORMAL HIGH (ref 6–20)
CO2: 23 mmol/L (ref 22–32)
Calcium: 9.2 mg/dL (ref 8.9–10.3)
Chloride: 110 mmol/L (ref 98–111)
Creatinine, Ser: 0.85 mg/dL (ref 0.44–1.00)
GFR, Estimated: >60 mL/min (ref >60)
Glucose, Bld: 111 mg/dL — ABNORMAL HIGH (ref 70–99)
Potassium: 4.2 mmol/L (ref 3.5–5.1)
Sodium: 143 mmol/L (ref 135–145)

## 2023-11-02 LAB — URINALYSIS, W/ REFLEX TO CULTURE (INFECTION SUSPECTED)
Bacteria, UA: NONE SEEN
Bilirubin Urine: NEGATIVE
Glucose, UA: NEGATIVE mg/dL
Hgb urine dipstick: NEGATIVE
Ketones, ur: NEGATIVE mg/dL
Leukocytes,Ua: NEGATIVE
Nitrite: NEGATIVE
Protein, ur: NEGATIVE mg/dL
Specific Gravity, Urine: 1.003 — ABNORMAL LOW (ref 1.005–1.030)
pH: 7 (ref 5.0–8.0)

## 2023-11-02 LAB — D-DIMER, QUANTITATIVE: D-Dimer, Quant: 0.47 ug{FEU}/mL (ref 0.00–0.50)

## 2023-11-02 LAB — TSH: TSH: 2.91 u[IU]/mL (ref 0.350–4.500)

## 2023-11-02 LAB — MAGNESIUM: Magnesium: 2.1 mg/dL (ref 1.7–2.4)

## 2023-11-02 LAB — TROPONIN I (HIGH SENSITIVITY)
Troponin I (High Sensitivity): 10 ng/L (ref <18)
Troponin I (High Sensitivity): 10 ng/L (ref <18)

## 2023-11-02 MED ORDER — LISINOPRIL 10 MG PO TABS: 20.0000 mg | ORAL_TABLET | Freq: Every day | ORAL | Status: DC

## 2023-11-02 MED ORDER — METOPROLOL TARTRATE 25 MG PO TABS
25.0000 mg | ORAL_TABLET | Freq: Once | ORAL | Status: AC
  Administered 2023-11-02: 25 mg via ORAL
  Filled 2023-11-02: qty 1

## 2023-11-02 MED ORDER — ALBUTEROL SULFATE (2.5 MG/3ML) 0.083% IN NEBU: 2.5000 mg | INHALATION_SOLUTION | Freq: Four times a day (QID) | RESPIRATORY_TRACT | Status: DC | PRN

## 2023-11-02 MED ORDER — SODIUM CHLORIDE 0.9 % IV BOLUS (SEPSIS)
1000.0000 mL | Freq: Once | INTRAVENOUS | Status: AC
  Administered 2023-11-02: 1000 mL via INTRAVENOUS

## 2023-11-02 MED ORDER — DILTIAZEM HCL-DEXTROSE 125-5 MG/125ML-% IV SOLN (PREMIX)
5.0000 mg/h | INTRAVENOUS | Status: DC
  Administered 2023-11-02: 5 mg/h via INTRAVENOUS
  Filled 2023-11-02: qty 125

## 2023-11-02 MED ORDER — DIPHENHYDRAMINE-APAP (SLEEP) 25-500 MG PO TABS: 2.0000 | ORAL_TABLET | Freq: Every day | ORAL | Status: DC

## 2023-11-02 MED ORDER — CITALOPRAM HYDROBROMIDE 20 MG PO TABS
40.0000 mg | ORAL_TABLET | Freq: Every day | ORAL | Status: DC
  Administered 2023-11-02 – 2023-11-03 (×2): 40 mg via ORAL
  Filled 2023-11-02 (×2): qty 2

## 2023-11-02 MED ORDER — FAMOTIDINE 20 MG PO TABS
20.0000 mg | ORAL_TABLET | Freq: Two times a day (BID) | ORAL | Status: DC
  Administered 2023-11-02 – 2023-11-03 (×2): 20 mg via ORAL
  Filled 2023-11-02 (×3): qty 1

## 2023-11-02 MED ORDER — METOPROLOL TARTRATE 50 MG PO TABS
75.0000 mg | ORAL_TABLET | Freq: Two times a day (BID) | ORAL | Status: DC
  Administered 2023-11-02 – 2023-11-03 (×2): 75 mg via ORAL
  Filled 2023-11-02: qty 1
  Filled 2023-11-02: qty 3

## 2023-11-02 MED ORDER — INSULIN ASPART 100 UNIT/ML IJ SOLN: 0.0000 [IU] | Freq: Three times a day (TID) | INTRAMUSCULAR | Status: DC

## 2023-11-02 MED ORDER — ALPRAZOLAM 0.25 MG PO TABS
0.2500 mg | ORAL_TABLET | Freq: Two times a day (BID) | ORAL | Status: DC | PRN
  Administered 2023-11-02: 0.25 mg via ORAL
  Filled 2023-11-02: qty 1

## 2023-11-02 MED ORDER — MELATONIN 5 MG PO TABS: 5.0000 mg | ORAL_TABLET | ORAL | Status: DC | PRN

## 2023-11-02 MED ORDER — METOPROLOL TARTRATE 25 MG PO TABS
12.5000 mg | ORAL_TABLET | Freq: Two times a day (BID) | ORAL | Status: DC
  Administered 2023-11-02: 12.5 mg via ORAL
  Filled 2023-11-02: qty 1

## 2023-11-02 MED ORDER — APIXABAN 5 MG PO TABS: 5.0000 mg | ORAL_TABLET | Freq: Two times a day (BID) | ORAL | Status: DC

## 2023-11-02 MED ORDER — ONDANSETRON HCL 4 MG/2ML IJ SOLN: 4.0000 mg | Freq: Four times a day (QID) | INTRAMUSCULAR | Status: DC | PRN

## 2023-11-02 MED ORDER — LUBIPROSTONE 24 MCG PO CAPS
24.0000 ug | ORAL_CAPSULE | Freq: Two times a day (BID) | ORAL | Status: DC
  Administered 2023-11-02 – 2023-11-03 (×2): 24 ug via ORAL
  Filled 2023-11-02 (×5): qty 1

## 2023-11-02 MED ORDER — ACETAMINOPHEN 325 MG PO TABS
650.0000 mg | ORAL_TABLET | ORAL | Status: DC | PRN
Start: 2023-11-02 — End: 2023-11-03

## 2023-11-02 MED ORDER — ENOXAPARIN SODIUM 40 MG/0.4ML IJ SOSY: 40.0000 mg | PREFILLED_SYRINGE | INTRAMUSCULAR | Status: DC

## 2023-11-02 MED ORDER — METOPROLOL TARTRATE 5 MG/5ML IV SOLN: 2.5000 mg | INTRAVENOUS | Status: DC | PRN

## 2023-11-02 MED ORDER — APIXABAN 5 MG PO TABS
5.0000 mg | ORAL_TABLET | Freq: Two times a day (BID) | ORAL | Status: DC
  Administered 2023-11-02 – 2023-11-03 (×3): 5 mg via ORAL
  Filled 2023-11-02 (×3): qty 1

## 2023-11-02 MED ORDER — METOPROLOL TARTRATE 50 MG PO TABS: 50.0000 mg | ORAL_TABLET | Freq: Two times a day (BID) | ORAL | Status: DC

## 2023-11-02 NOTE — ED Provider Notes (Signed)
 [facility] Provider Note    Event Date/Time   First MD Initiated Contact with Patient 11/02/23 0327     (approximate)   History   Palpitations   HPI  [name] is a 53 y.o. female with history of hypertension on lisinopril , hypothyroidism, PCOS, prediabetes, asthma, psoriatic arthritis who presents to the emergency department with complaints of palpitations that started yesterday afternoon.  No chest pain, shortness of breath, vomiting, diarrhea, fevers, cough, lower extremity swelling or pain.  No history of ACS, CHF, PE, DVT, stroke.  States she did drink 2 diet sodas yesterday and a large iced coffee.  No illicit drug use.  Does not drink alcohol regularly.  Does have significant family history of atrial fibrillation in both of her parents.  She has never had an arrhythmia before.  Per EMS, patient in atrial fibrillation with rates into the 140s.  Patient is wondering if the amount of caffeine she drank yesterday put her into atrial fibrillation.  She also states that she received cortisone injections in her right shoulder and hip on Monday and is worried this could have caused her symptoms as well.   History provided by patient, husband, EMS.    Past Medical History:  Diagnosis Date   Anemia    Anxiety    Arthritis    inflammitory   Asthma    B12 deficiency    Back pain    Chronic joint pain    Constipation    DDD (degenerative disc disease), lumbar    low back and neck and shoulders   Depression    during divorce & legal matters   Fatty liver    Fibromyalgia    Gallbladder problem    GERD (gastroesophageal reflux disease)    History of kidney stones    Dr rosalynn Likens   History of polycystic ovarian disease S/P BSO   Hx of anxiety disorder    Hypertension    a   Hypothyroidism    no meds   IBS (irritable bowel syndrome)    IBS (irritable bowel syndrome)    Infertility, female    Joint pain    Kidney problem    Lactose  intolerance    Lupus hx positive ANA   followed by Dr Jon Jacob; possible lupus   Multiple food allergies    Myalgia    Osteoarthritis    Polyarthritis, 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HCC)    POLYCYSTIC OVARIAN DISEASE 03/02/2007   Qualifier: Diagnosis of  By: Tish MD, Elsie POUR BSO for cysts & endometriosis; Dr Thyra, Gyn Seeing Dr Nathanel Bunker     PONV (postoperative nausea and vomiting)    Pre-diabetes    Prediabetes    Sweating profusely    Symptomatic mammary hypertrophy    URI (upper respiratory infection)    currently on cefdinir     Past Surgical History:  Procedure Laterality Date   ABDOMINAL HYSTERECTOMY     ANKLE SURGERY     X3   APPENDECTOMY  1991   exploratory lap   BACK SURGERY     BREAST REDUCTION SURGERY Bilateral 03/26/2015   Procedure: BILATERAL BREAST REDUCTION ;  Surgeon: Estefana GORMAN Fritter, DO;  Location: [facility];  Service: Plastics;  Laterality: Bilateral;   CARPAL TUNNEL RELEASE     bilateral; Dr Alix   COLONOSCOPY     in 1990s   CYSTO/ RIGHT RETROGRADE URETERAL PYELOGRAM  07/09/2004   HX BILATERAL RENAL STONES/ RIGHT FLANK PAIN  CYSTOSCOPY W/ URETERAL STENT PLACEMENT  11/12/2011   Procedure: CYSTOSCOPY WITH RETROGRADE PYELOGRAM/URETERAL STENT PLACEMENT;  Surgeon: Ricardo Likens, MD;  Location: [facility];  Service: Urology;  Laterality: Right;   CYSTOSCOPY W/ URETERAL STENT REMOVAL  12/07/2011   Procedure: CYSTOSCOPY WITH STENT REMOVAL;  Surgeon: Ricardo Likens, MD;  Location: [facility];  Service: Urology;  Laterality: Right;   CYSTOSCOPY/RETROGRADE/URETEROSCOPY/STONE EXTRACTION WITH BASKET  12/07/2011   Procedure: CYSTOSCOPY/RETROGRADE/URETEROSCOPY/STONE EXTRACTION WITH BASKET;  Surgeon: Ricardo Likens, MD;  Location: [facility];  Service: Urology;  Laterality: Right;   DILATION AND CURETTAGE OF UTERUS     X5   EXCISION LEFT BARTHOLIN GLAND  02/05/2002   EYE SURGERY     Retinal tears surgery  bilateral   LAPAROSCOPIC ASSISTED VAGINAL HYSTERECTOMY  2000   LAPAROSCOPIC CHOLECYSTECTOMY  05/31/2007   LAPAROSCOPIC LASER ABLATION ENDOMETRIOSIS AND LEFT SALPINGO-OOPHORECTOMY  11/03/1999   LAPAROSCOPY WITH RIGHT SALPINGO-OOPHECTOMY/ LYSIS ADHESIONS AND ABLATION ENDOMETRIOSIS  05/17/2001   LIPOSUCTION Bilateral 03/26/2015   Procedure: WITH LIPOSUCTION;  Surgeon: Estefana GORMAN Fritter, DO;  Location: [facility];  Service: Plastics;  Laterality: Bilateral;   LUMBAR LAMINECTOMY  2003   L4 - L5   NECK SURGERY  2017   PLANTAR FASCIA SURGERY     right   PLANTAR FASCIA SURGERY  02/2011   left   REDUCTION MAMMAPLASTY  2016   RIGHT URETEROSCOPIC STONE EXTRACTION  08/04/2000   SHOULDER ARTHROSCOPY WITH ROTATOR CUFF REPAIR Right 05/11/2020   Procedure: SHOULDER ARTHROSCOPY WITH ROTATOR CUFF REPAIR;  Surgeon: Leora Lynwood SAUNDERS, MD;  Location: [facility];  Service: Orthopedics;  Laterality: Right;   TONSILLECTOMY     TOTAL HIP ARTHROPLASTY Left 01/11/2022   Procedure: TOTAL HIP ARTHROPLASTY ANTERIOR APPROACH;  Surgeon: Leora Lynwood SAUNDERS, MD;  Location: [facility];  Service: Orthopedics;  Laterality: Left;   URETERAL STENT PLACEMENT  11/12/2011   Dr Likens    MEDICATIONS:  Prior to Admission medications   Medication Sig Start Date End Date Taking? Authorizing Provider  acetaminophen  (TYLENOL ) 650 MG CR tablet Take 1,300 mg by mouth every 8 (eight) hours as needed for pain.    [provider]  albuterol  (VENTOLIN  HFA) 108 (90 Base) MCG/ACT inhaler Inhale 2 puffs into the lungs every 6 (six) hours as needed for wheezing or shortness of breath. TAKE 2 PUFFS BY MOUTH EVERY 6 HOURS AS NEEDED FOR WHEEZE OR SHORTNESS OF BREATH Patient not taking: Reported on 09/27/2023 02/23/23   Geofm Glade PARAS, MD  aspirin  81 MG chewable tablet Chew 1 tablet (81 mg total) by mouth 2 (two) times daily. 01/12/22   Joshua Lin, PA-C  buPROPion  ER (WELLBUTRIN  SR) 100 MG 12 hr tablet Take 1 tablet (100 mg  total) by mouth 2 (two) times daily. Patient not taking: Reported on 09/27/2023 09/13/23   Midge Sober, DO  celecoxib  (CELEBREX ) 100 MG capsule Take 100 mg by mouth daily.    [provider]  citalopram  (CELEXA ) 40 MG tablet Take 1 tablet (40 mg total) by mouth daily. 05/03/23   Geofm Glade PARAS, MD  diphenhydramine -acetaminophen  (TYLENOL  PM) 25-500 MG TABS tablet Take 2 tablets by mouth at bedtime.    [provider]  famotidine  (PEPCID ) 20 MG tablet Take 1 tablet (20 mg total) by mouth 2 (two) times daily. TAKE 1 TABLET BY MOUTH EVERYDAY AT BEDTIME 09/27/23   Beather Delon Gibson, PA  hydrochlorothiazide  (HYDRODIURIL ) 25 MG tablet Take 25 mg by mouth daily. Patient not taking: Reported on  09/27/2023 07/16/23   [provider]  lisinopril  (ZESTRIL ) 20 MG tablet TAKE 1 TABLET BY MOUTH EVERY DAY 05/02/23   Geofm Glade PARAS, MD  lubiprostone  (AMITIZA ) 24 MCG capsule Take 1 capsule (24 mcg total) by mouth 2 (two) times daily with a meal. 09/27/23   Beather Delon Gibson, PA  Melatonin 5 MG CHEW Chew 1 tablet by mouth as needed.    [provider]  metFORMIN  (GLUCOPHAGE ) 500 MG tablet Take 1 tablet (500 mg total) by mouth 2 (two) times daily with a meal. Patient not taking: Reported on 09/27/2023 09/18/23   Verdon Louann BIRCH, MD  SKYRIZI PEN 150 MG/ML SOAJ Inject into the skin. 03/07/22   [provider]  tirzepatide  (MOUNJARO ) 15 MG/0.5ML Pen Inject 15 mg into the skin once a week. 09/18/23   Verdon Louann D, MD  topiramate  (TOPAMAX ) 50 MG tablet Take 1 tablet (50 mg total) by mouth daily. Patient not taking: Reported on 09/27/2023 09/18/23   Verdon Louann D, MD  Vitamin D , Ergocalciferol , (DRISDOL ) 1.25 MG (50000 UNIT) CAPS capsule Take 1 capsule (50,000 Units total) by mouth every 7 (seven) days. 09/18/23   Verdon Louann D, MD    Physical Exam   Triage Vital Signs: ED Triage Vitals  Encounter Vitals Group     BP 11/02/23 0317 (!) 123/93     Girls Systolic  BP Percentile --      Girls Diastolic BP Percentile --      Boys Systolic BP Percentile --      Boys Diastolic BP Percentile --      Pulse Rate 11/02/23 0317 74     Resp 11/02/23 0317 20     Temp 11/02/23 0317 99.1 F (37.3 C)     Temp Source 11/02/23 0317 Oral     SpO2 11/02/23 0315 96 %     Weight 11/02/23 0318 188 lb (85.3 kg)     Height 11/02/23 0318 5' 8 (1.727 m)     Head Circumference --      Peak Flow --      Pain Score 11/02/23 0318 0     Pain Loc --      Pain Education --      Exclude from Growth Chart --     Most recent vital signs: Vitals:   11/02/23 0630 11/02/23 0645  BP: (!) 124/91 132/77  Pulse:  61  Resp: 16 18  Temp:    SpO2: 100% 100%    CONSTITUTIONAL: Alert, responds appropriately to questions. Well-appearing; well-nourished HEAD: Normocephalic, atraumatic EYES: Conjunctivae clear, pupils appear equal, sclera nonicteric ENT: normal nose; moist mucous membranes NECK: Supple, normal ROM CARD: Irregularly irregular and tachycardic; S1 and S2 appreciated RESP: Normal chest excursion without splinting or tachypnea; breath sounds clear and equal bilaterally; no wheezes, no rhonchi, no rales, no hypoxia or respiratory distress, speaking full sentences ABD/GI: Non-distended; soft, non-tender, no rebound, no guarding, no peritoneal signs BACK: The back appears normal EXT: Normal ROM in all joints; no deformity noted, no edema, no calf tenderness or calf swelling SKIN: Normal color for age and race; warm; no rash on exposed skin NEURO: Moves all extremities equally, normal speech PSYCH: The patient's mood and manner are appropriate.   ED Results / Procedures / Treatments   LABS: (all labs ordered are listed, but only abnormal results are displayed) Labs Reviewed  BASIC METABOLIC PANEL WITH GFR - Abnormal; Notable for the following components:      Result Value  Glucose, Bld 111 (*)    BUN 23 (*)    All other components within normal limits  CBC -  Abnormal; Notable for the following components:   WBC 12.0 (*)    All other components within normal limits  URINALYSIS, W/ REFLEX TO CULTURE (INFECTION SUSPECTED) - Abnormal; Notable for the following components:   Color, Urine COLORLESS (*)    APPearance CLEAR (*)    Specific Gravity, Urine 1.003 (*)    All other components within normal limits  MAGNESIUM   TSH  D-DIMER, QUANTITATIVE  HIV ANTIBODY (ROUTINE TESTING W REFLEX)  TROPONIN I (HIGH SENSITIVITY)  TROPONIN I (HIGH SENSITIVITY)     EKG:  EKG Interpretation Date/Time:  Thursday November 02 2023 03:15:18 EDT Ventricular Rate:  123 PR Interval:    QRS Duration:  86 QT Interval:  313 QTC Calculation: 448 R Axis:   84  Text Interpretation: Atrial fibrillation Ventricular premature complex Low voltage, precordial leads Confirmed by Neomi Neptune [phone]) on 11/02/2023 3:36:09 AM         RADIOLOGY: My personal review and interpretation of imaging: Chest x-ray clear.  I have personally reviewed all radiology reports.   DG Chest 1 View Result Date: 11/02/2023 CLINICAL DATA:  Chest pain and cardiac palpitations EXAM: PORTABLE CHEST 1 VIEW COMPARISON:  04/19/2015 FINDINGS: The heart size and mediastinal contours are within normal limits. Both lungs are clear. The visualized skeletal structures show postsurgical changes in the cervical spine. IMPRESSION: No active disease. Electronically Signed   By: Oneil Devonshire M.D.   On: 11/02/2023 03:37     PROCEDURES:  Critical Care performed: Yes, see critical care procedure note(s)   CRITICAL CARE Performed by: Neptune Shaquna Geigle   Total critical care time: 30 minutes  Critical care time was exclusive of separately billable procedures and treating other patients.  Critical care was necessary to treat or prevent imminent or life-threatening deterioration.  Critical care was time spent personally by me on the following activities: development of treatment plan with patient and/or  surrogate as well as nursing, discussions with consultants, evaluation of patient's response to treatment, examination of patient, obtaining history from patient or surrogate, ordering and performing treatments and interventions, ordering and review of laboratory studies, ordering and review of radiographic studies, pulse oximetry and re-evaluation of patient's condition.   SABRA1-3 Lead EKG Interpretation  Performed by: Mouna Yager, Neptune SAILOR, DO Authorized by: Dontavis Tschantz, Neptune SAILOR, DO     Interpretation: abnormal     ECG rate:  134   ECG rate assessment: tachycardic     Rhythm: atrial fibrillation     Ectopy: none     Conduction: normal       IMPRESSION / MDM / ASSESSMENT AND PLAN / ED COURSE  I reviewed the triage vital signs and the nursing notes.    Patient here with new onset atrial fibrillation with RVR.  The patient is on the cardiac monitor to evaluate for evidence of arrhythmia and/or significant heart rate changes.   DIFFERENTIAL DIAGNOSIS (includes but not limited to):   New onset atrial fibrillation, anemia, electrolyte derangement, ACS, PE, thyroid  dysfunction, dehydration, side effect from steroid   Patient's presentation is most consistent with acute presentation with potential threat to life or bodily function.   PLAN: Patient's CHA2DS2-VASc 2 score is 2.  Given she is able to tell me exactly when symptoms started and it has been less than 24 hours, I feel she would be a good candidate for electrocardioversion.  Discussed risk and  benefits with patient and family but patient states she is very nervous about this and would prefer IV medications and admission to the hospital.  Will obtain labs, urine, chest x-ray.  Will start IV diltiazem .  Hemodynamically stable here.   MEDICATIONS GIVEN IN ED: Medications  diltiazem  (CARDIZEM ) 125 mg in dextrose  5% 125 mL (1 mg/mL) infusion (10 mg/hr Intravenous Rate/Dose Change 11/02/23 0435)  acetaminophen  (TYLENOL ) tablet 650 mg (has no  administration in time range)  ondansetron  (ZOFRAN ) injection 4 mg (has no administration in time range)  enoxaparin  (LOVENOX ) injection 40 mg (has no administration in time range)  ALPRAZolam  (XANAX ) tablet 0.25 mg (has no administration in time range)  sodium chloride  0.9 % bolus 1,000 mL (0 mLs Intravenous Stopped 11/02/23 0639)     ED COURSE: Normal hemoglobin, electrolytes, thyroid  function.  Negative troponin and D-dimer.  Urine shows no sign of infection, dehydration.  Chest x-ray reviewed and interpreted by myself and the radiologist is clear.  Will discuss with the hospitalist for admission.   CONSULTS:  Consulted and discussed patient's case with hospitalist, Dr. [name].  I have recommended admission and consulting physician agrees and will place admission orders.  Patient (and family if present) agree with this plan.   I reviewed all nursing notes, vitals, pertinent previous records.  All labs, EKGs, imaging ordered have been independently reviewed and interpreted by myself.     OUTSIDE RECORDS REVIEWED: Reviewed rheumatology note on 10/16/2023.       FINAL CLINICAL IMPRESSION(S) / ED DIAGNOSES   Final diagnoses:  Atrial fibrillation with RVR (HCC)     Rx / DC Orders   ED Discharge Orders          Ordered    Amb referral to [facility]        11/02/23 0518             Note:  This document was prepared using Dragon voice recognition software and may include unintentional dictation errors.   Deaundre Allston, Josette SAILOR, DO 11/02/23 [phone]

## 2023-11-02 NOTE — H&P (Signed)
 History and Physical    [name] FMW:[id number] DOB: Apr 08, 1970 DOA: 11/02/2023  PCP: Geofm Glade PARAS, MD  Patient coming from: home  I have personally briefly reviewed patient's old medical records in [facility]  Chief Complaint: palpitations  HPI: [name] is a 53 y.o. female with medical history significant of Anemia, Anxiety, Depression,Asthma,GERD,HTN, Hypothyroidism,Lupus, IBS,PCOS, who presents to ED with complaints of palpitations intermittent x 24 hours who progression in severity with last episode that occurred am of presentation.  Patient denies any associated chest pain , n/v/d/ fever/chills/ or presyncope.  She note no prior episodes.  She notes no cardiac history or history of arrhythmia.    ED Course:  Patient on evaluation found to have Afib with rvr for which she was started on cardizem  drip and admitted to progressive care.  Infectious work up noted to be negative, preliminary ischemic work up also unrevealing.   Vitals: tx 99.1, BP 123/93, hr123, rr 20 sat 96% Wbc 12, hgb 12.5, plt 259 Labs  CE 10,  D-dimer 0.47,  Mag 2.1  TSH 2.9 Cxr: NAD  UDS  Neg Tx NS 1L , cardizem  drip   Review of Systems: As per HPI otherwise 10 point review of systems negative.   Past Medical History:  Diagnosis Date   Anemia    Anxiety    Arthritis    inflammitory   Asthma    B12 deficiency    Back pain    Chronic joint pain    Constipation    DDD (degenerative disc disease), lumbar    low back and neck and shoulders   Depression    during divorce & legal matters   Fatty liver    Fibromyalgia    Gallbladder problem    GERD (gastroesophageal reflux disease)    History of kidney stones    Dr rosalynn Likens   History of polycystic ovarian disease S/P BSO   Hx of anxiety disorder    Hypertension    a   Hypothyroidism    no meds   IBS (irritable bowel syndrome)    IBS (irritable bowel syndrome)    Infertility, female    Joint pain    Kidney problem     Lactose intolerance    Lupus hx positive ANA   followed by Dr Jon Jacob; possible lupus   Multiple food allergies    Myalgia    Osteoarthritis    Polyarthritis, 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HCC)    POLYCYSTIC OVARIAN DISEASE 03/02/2007   Qualifier: Diagnosis of  By: Tish MD, Elsie POUR BSO for cysts & endometriosis; Dr Thyra, Gyn Seeing Dr Nathanel Bunker     PONV (postoperative nausea and vomiting)    Pre-diabetes    Prediabetes    Sweating profusely    Symptomatic mammary hypertrophy    URI (upper respiratory infection)    currently on cefdinir     Past Surgical History:  Procedure Laterality Date   ABDOMINAL HYSTERECTOMY     ANKLE SURGERY     X3   APPENDECTOMY  1991   exploratory lap   BACK SURGERY     BREAST REDUCTION SURGERY Bilateral 03/26/2015   Procedure: BILATERAL BREAST REDUCTION ;  Surgeon: Estefana GORMAN Fritter, DO;  Location: [facility];  Service: Plastics;  Laterality: Bilateral;   CARPAL TUNNEL RELEASE     bilateral; Dr Alix   COLONOSCOPY     in 1990s   CYSTO/ RIGHT RETROGRADE URETERAL PYELOGRAM  07/09/2004   HX  BILATERAL RENAL STONES/ RIGHT FLANK PAIN   CYSTOSCOPY W/ URETERAL STENT PLACEMENT  11/12/2011   Procedure: CYSTOSCOPY WITH RETROGRADE PYELOGRAM/URETERAL STENT PLACEMENT;  Surgeon: Ricardo Likens, MD;  Location: [facility];  Service: Urology;  Laterality: Right;   CYSTOSCOPY W/ URETERAL STENT REMOVAL  12/07/2011   Procedure: CYSTOSCOPY WITH STENT REMOVAL;  Surgeon: Ricardo Likens, MD;  Location: [facility];  Service: Urology;  Laterality: Right;   CYSTOSCOPY/RETROGRADE/URETEROSCOPY/STONE EXTRACTION WITH BASKET  12/07/2011   Procedure: CYSTOSCOPY/RETROGRADE/URETEROSCOPY/STONE EXTRACTION WITH BASKET;  Surgeon: Ricardo Likens, MD;  Location: [facility];  Service: Urology;  Laterality: Right;   DILATION AND CURETTAGE OF UTERUS     X5   EXCISION LEFT BARTHOLIN GLAND  02/05/2002   EYE SURGERY     Retinal tears surgery  bilateral   LAPAROSCOPIC ASSISTED VAGINAL HYSTERECTOMY  2000   LAPAROSCOPIC CHOLECYSTECTOMY  05/31/2007   LAPAROSCOPIC LASER ABLATION ENDOMETRIOSIS AND LEFT SALPINGO-OOPHORECTOMY  11/03/1999   LAPAROSCOPY WITH RIGHT SALPINGO-OOPHECTOMY/ LYSIS ADHESIONS AND ABLATION ENDOMETRIOSIS  05/17/2001   LIPOSUCTION Bilateral 03/26/2015   Procedure: WITH LIPOSUCTION;  Surgeon: Estefana GORMAN Fritter, DO;  Location: [facility];  Service: Plastics;  Laterality: Bilateral;   LUMBAR LAMINECTOMY  2003   L4 - L5   NECK SURGERY  2017   PLANTAR FASCIA SURGERY     right   PLANTAR FASCIA SURGERY  02/2011   left   REDUCTION MAMMAPLASTY  2016   RIGHT URETEROSCOPIC STONE EXTRACTION  08/04/2000   SHOULDER ARTHROSCOPY WITH ROTATOR CUFF REPAIR Right 05/11/2020   Procedure: SHOULDER ARTHROSCOPY WITH ROTATOR CUFF REPAIR;  Surgeon: Leora Lynwood SAUNDERS, MD;  Location: [facility];  Service: Orthopedics;  Laterality: Right;   TONSILLECTOMY     TOTAL HIP ARTHROPLASTY Left 01/11/2022   Procedure: TOTAL HIP ARTHROPLASTY ANTERIOR APPROACH;  Surgeon: Leora Lynwood SAUNDERS, MD;  Location: [facility];  Service: Orthopedics;  Laterality: Left;   URETERAL STENT PLACEMENT  11/12/2011   Dr Likens     reports that she has never smoked. She has never been exposed to tobacco smoke. She has never used smokeless tobacco. She reports current alcohol use. She reports that she does not use drugs.  Allergies  Allergen Reactions   Chlorhexidine  Gluconate Rash, Itching and Swelling    Developed severe rash where chloraprep was used on chest area   Ivp Dye [Iodinated Contrast Media] Rash and Other (See Comments)    Flushing, minor facial rash & dyspnea   Nalbuphine Shortness Of Breath and Rash    Nubain caused respiratory distress & rash   Septra [Sulfamethoxazole-Trimethoprim] Shortness Of Breath and Rash   Adhesive [Tape] Rash   Cymbalta  [Duloxetine  Hcl]     Had drastic mood changes   Diclofenac Other (See Comments)    GI Upset    Humira [Adalimumab]     Did not like the way I felt   Methotrexate Derivatives     GI upset     Augmentin  [Amoxicillin -Pot Clavulanate] Nausea And Vomiting   Betadine [Povidone Iodine] Rash   Latex Rash   Otezla [Apremilast] Rash    Family History  Problem Relation Age of Onset   Hypertension Mother    Colon polyps Mother    Atrial fibrillation Mother        Dr Verlin   Hyperlipidemia Mother    Heart disease Mother    Anxiety disorder Mother    Sleep apnea Mother    Obesity Mother    Heart attack Father 89   Diabetes Father  Hypertension Father    Hyperlipidemia Father    Heart disease Father    Stroke Father    Obesity Father    Hypertension Sister    Heart attack Paternal Grandmother 21   Breast cancer Paternal Aunt 53   Colon cancer Maternal Uncle    Colon cancer Paternal Uncle    Colon polyps Maternal Grandmother    Stroke Maternal Grandmother 39   Diabetes Paternal Grandfather    Stroke Maternal Grandfather 37    Prior to Admission medications   Medication Sig Start Date End Date Taking? Authorizing Provider  acetaminophen  (TYLENOL ) 650 MG CR tablet Take 1,300 mg by mouth every 8 (eight) hours as needed for pain.   Yes [provider]  albuterol  (VENTOLIN  HFA) 108 (90 Base) MCG/ACT inhaler Inhale 2 puffs into the lungs every 6 (six) hours as needed for wheezing or shortness of breath. TAKE 2 PUFFS BY MOUTH EVERY 6 HOURS AS NEEDED FOR WHEEZE OR SHORTNESS OF BREATH 02/23/23  Yes Burns, Glade PARAS, MD  aspirin  81 MG chewable tablet Chew 1 tablet (81 mg total) by mouth 2 (two) times daily. 01/12/22  Yes Joshua Lin, PA-C  celecoxib  (CELEBREX ) 100 MG capsule Take 100 mg by mouth daily.   Yes [provider]  citalopram  (CELEXA ) 40 MG tablet Take 1 tablet (40 mg total) by mouth daily. 05/03/23  Yes Burns, Glade PARAS, MD  cyclobenzaprine  (FLEXERIL ) 10 MG tablet Take 10 mg by mouth at bedtime. 08/31/23  Yes [provider]   diphenhydramine -acetaminophen  (TYLENOL  PM) 25-500 MG TABS tablet Take 2 tablets by mouth at bedtime.   Yes [provider]  famotidine  (PEPCID ) 20 MG tablet Take 1 tablet (20 mg total) by mouth 2 (two) times daily. TAKE 1 TABLET BY MOUTH EVERYDAY AT BEDTIME 09/27/23  Yes Beather Delon Gibson, [place]  hydrochlorothiazide  (HYDRODIURIL ) 25 MG tablet Take 25 mg by mouth daily. 07/16/23  Yes [provider]  lisinopril  (ZESTRIL ) 20 MG tablet TAKE 1 TABLET BY MOUTH EVERY DAY 05/02/23  Yes Burns, Glade PARAS, MD  lubiprostone  (AMITIZA ) 24 MCG capsule Take 1 capsule (24 mcg total) by mouth 2 (two) times daily with a meal. 09/27/23  Yes Beather Delon Gibson, PA  Melatonin 5 MG CHEW Chew 1 tablet by mouth as needed.   Yes [provider]  SKYRIZI PEN 150 MG/ML SOAJ Inject into the skin. 03/07/22  Yes [provider]  tirzepatide  (MOUNJARO ) 15 MG/0.5ML Pen Inject 15 mg into the skin once a week. 09/18/23  Yes Beasley, Caren D, MD  Vitamin D , Ergocalciferol , (DRISDOL ) 1.25 MG (50000 UNIT) CAPS capsule Take 1 capsule (50,000 Units total) by mouth every 7 (seven) days. 09/18/23  Yes Verdon Parry D, MD  buPROPion  ER (WELLBUTRIN  SR) 100 MG 12 hr tablet Take 1 tablet (100 mg total) by mouth 2 (two) times daily. Patient not taking: No sig reported 09/13/23   Midge Sober, DO  metFORMIN  (GLUCOPHAGE ) 500 MG tablet Take 1 tablet (500 mg total) by mouth 2 (two) times daily with a meal. Patient not taking: No sig reported 09/18/23   Verdon Parry D, MD  topiramate  (TOPAMAX ) 50 MG tablet Take 1 tablet (50 mg total) by mouth daily. Patient not taking: No sig reported 09/18/23   Verdon Parry D, MD    Physical Exam: Vitals:   11/02/23 0600 11/02/23 0615 11/02/23 0630 11/02/23 0645  BP: 128/88 115/85 (!) 124/91 132/77  Pulse: 67 92  61  Resp: 15 17 16 18   Temp:  TempSrc:      SpO2: 100% 100% 100% 100%  Weight:      Height:        Constitutional: NAD, calm, comfortable Vitals:    11/02/23 0600 11/02/23 0615 11/02/23 0630 11/02/23 0645  BP: 128/88 115/85 (!) 124/91 132/77  Pulse: 67 92  61  Resp: 15 17 16 18   Temp:      TempSrc:      SpO2: 100% 100% 100% 100%  Weight:      Height:       Eyes: PERRL, lids and conjunctivae normal ENMT: Mucous membranes are moist. Posterior pharynx clear of any exudate or lesions.Normal dentition.  Neck: normal, supple, no masses, no thyromegaly Respiratory: clear to auscultation bilaterally, no wheezing, no crackles. Normal respiratory effort. No accessory muscle use.  Cardiovascular: irregular rate and rhythm, no murmurs / rubs / gallops. No extremity edema. 2+ pedal pulses..  Abdomen: no tenderness, no masses palpated. No hepatosplenomegaly. Bowel sounds positive.  Musculoskeletal: no clubbing / cyanosis. No joint deformity upper and lower extremities. Good ROM, no contractures. Normal muscle tone.  Skin: no rashes, lesions, ulcers. No induration Neurologic: CN grossly intact. Sensation intact, Strength 5/5 in all 4.  Psychiatric: Normal judgment and insight. Alert and oriented x 3. Normal mood.    Labs on Admission: I have personally reviewed following labs and imaging studies  CBC: Recent Labs  Lab 11/02/23 0321  WBC 12.0*  HGB 12.5  HCT 38.3  MCV 86.5  PLT 259   Basic Metabolic Panel: Recent Labs  Lab 11/02/23 0321  NA 143  K 4.2  CL 110  CO2 23  GLUCOSE 111*  BUN 23*  CREATININE 0.85  CALCIUM 9.2  MG 2.1   GFR: Estimated Creatinine Clearance: 87.6 mL/min (by C-G formula based on SCr of 0.85 mg/dL). Liver Function Tests: No results for input(s): AST, ALT, ALKPHOS, BILITOT, PROT, ALBUMIN in the last 168 hours. No results for input(s): LIPASE, AMYLASE in the last 168 hours. No results for input(s): AMMONIA in the last 168 hours. Coagulation Profile: No results for input(s): INR, PROTIME in the last 168 hours. Cardiac Enzymes: No results for input(s): CKTOTAL, CKMB,  CKMBINDEX, TROPONINI in the last 168 hours. BNP (last 3 results) No results for input(s): PROBNP in the last 8760 hours. HbA1C: No results for input(s): HGBA1C in the last 72 hours. CBG: No results for input(s): GLUCAP in the last 168 hours. Lipid Profile: No results for input(s): CHOL, HDL, LDLCALC, TRIG, CHOLHDL, LDLDIRECT in the last 72 hours. Thyroid  Function Tests: Recent Labs    11/02/23 0321  TSH 2.910   Anemia Panel: No results for input(s): VITAMINB12, FOLATE, FERRITIN, TIBC, IRON, RETICCTPCT in the last 72 hours. Urine analysis:    Component Value Date/Time   COLORURINE COLORLESS (A) 11/02/2023 0347   APPEARANCEUR CLEAR (A) 11/02/2023 0347   LABSPEC 1.003 (L) 11/02/2023 0347   PHURINE 7.0 11/02/2023 0347   GLUCOSEU NEGATIVE 11/02/2023 0347   GLUCOSEU NEGATIVE 04/28/2020 1444   HGBUR NEGATIVE 11/02/2023 0347   BILIRUBINUR NEGATIVE 11/02/2023 0347   BILIRUBINUR neg 02/16/2017 1018   KETONESUR NEGATIVE 11/02/2023 0347   PROTEINUR NEGATIVE 11/02/2023 0347   UROBILINOGEN 0.2 04/28/2020 1444   NITRITE NEGATIVE 11/02/2023 0347   LEUKOCYTESUR NEGATIVE 11/02/2023 0347    Radiological Exams on Admission: DG Chest 1 View Result Date: 11/02/2023 CLINICAL DATA:  Chest pain and cardiac palpitations EXAM: PORTABLE CHEST 1 VIEW COMPARISON:  04/19/2015 FINDINGS: The heart size and mediastinal contours are within normal  limits. Both lungs are clear. The visualized skeletal structures show postsurgical changes in the cervical spine. IMPRESSION: No active disease. Electronically Signed   By: Oneil Devonshire M.D.   On: 11/02/2023 03:37    EKG: Independently reviewed.  Assessment/Plan  Afib RVR  -no prior history  -start oral metoprolol  and titrate of drip as able  - echo  -CHADS 1-2  - cardiology consult for further assistance   Anemia  -at baseline   Anxiety/Depression  - resume home regimen once med rec completed   DMII -iss/fs  -hold  mounjaro  while in house   GERD -ppi   HTN  -d/c lisinopril  and start low dose metoprolol  with parameters  Hypothyroidism -previously diagnosed however now with normal tsh of supplementation  Lupus -no active flare   IBS -no active issue ,resume home regimen     DVT prophylaxis: OAC Code Status: full/ as discussed per patient wishes in event of cardiac arrest  Family Communication: .none at bedside Disposition Plan: full/ as discussed per patient wishes in event of cardiac arrest  Consults called: cardiology Admission status: progressive care   Camila DELENA Ned MD [facility]   If 7PM-7AM, please contact night-coverage [email or web address] Password [email or web address]  11/02/2023, 7:43 AM

## 2023-11-02 NOTE — ED Triage Notes (Signed)
 Patient brought in via [facility] today from home. Patient states she woke up to pee, felt like her heart was racing/having palpitations. States she felt like that earlier yesterday around lunchtime but it went away. Denies CP, dizziness, shortness of breath. Does not have hx of atrial fibrillation, not on thinners.

## 2023-11-02 NOTE — Progress Notes (Signed)
*  PRELIMINARY RESULTS* Echocardiogram 2D Echocardiogram has been performed.  [name] 11/02/2023, 3:45 PM

## 2023-11-02 NOTE — Telephone Encounter (Signed)
 Patient Product/process development scientist completed.    The patient is insured through [employer]/ [employer]. Patient has Medicare and is not eligible for a copay card, but may be able to apply for patient assistance or Medicare RX Payment Plan (Patient Must reach out to their plan, if eligible for payment plan), if available.    Ran test claim for Eliquis 5 mg and the current 30 day co-pay is $0.00.   This test claim was processed through [facility]- copay amounts may vary at other pharmacies due to pharmacy/plan contracts, or as the patient moves through the different stages of their insurance plan.     Roland Earl, CPHT Pharmacy Technician III Certified Patient Advocate [facility] Patient Advocate Team Direct Number: [phone]  Fax: [phone]

## 2023-11-02 NOTE — Plan of Care (Signed)
  Problem: Education: Goal: Knowledge of General Education information will improve Description: Including pain rating scale, medication(s)/side effects and non-pharmacologic comfort measures Outcome: Progressing   Problem: Health Behavior/Discharge Planning: Goal: Ability to manage health-related needs will improve Outcome: Progressing   Problem: Clinical Measurements: Goal: Ability to maintain clinical measurements within normal limits will improve Outcome: Progressing Goal: Will remain free from infection Outcome: Progressing Goal: Diagnostic test results will improve Outcome: Progressing Goal: Respiratory complications will improve Outcome: Progressing Goal: Cardiovascular complication will be avoided Outcome: Progressing   Problem: Activity: Goal: Risk for activity intolerance will decrease Outcome: Progressing   Problem: Nutrition: Goal: Adequate nutrition will be maintained Outcome: Progressing   Problem: Coping: Goal: Level of anxiety will decrease Outcome: Progressing   Problem: Elimination: Goal: Will not experience complications related to bowel motility Outcome: Progressing Goal: Will not experience complications related to urinary retention Outcome: Progressing   Problem: Pain Managment: Goal: General experience of comfort will improve and/or be controlled Outcome: Progressing   Problem: Safety: Goal: Ability to remain free from injury will improve Outcome: Progressing   Problem: Skin Integrity: Goal: Risk for impaired skin integrity will decrease Outcome: Progressing   Problem: Education: Goal: Ability to describe self-care measures that may prevent or decrease complications (Diabetes Survival Skills Education) will improve Outcome: Progressing Goal: Individualized Educational Video(s) Outcome: Progressing   Problem: Coping: Goal: Ability to adjust to condition or change in health will improve Outcome: Progressing   Problem: Fluid  Volume: Goal: Ability to maintain a balanced intake and output will improve Outcome: Progressing   Problem: Health Behavior/Discharge Planning: Goal: Ability to identify and utilize available resources and services will improve Outcome: Progressing Goal: Ability to manage health-related needs will improve Outcome: Progressing   Problem: Metabolic: Goal: Ability to maintain appropriate glucose levels will improve Outcome: Progressing   Problem: Nutritional: Goal: Maintenance of adequate nutrition will improve Outcome: Progressing Goal: Progress toward achieving an optimal weight will improve Outcome: Progressing   Problem: Skin Integrity: Goal: Risk for impaired skin integrity will decrease Outcome: Progressing   Problem: Tissue Perfusion: Goal: Adequacy of tissue perfusion will improve Outcome: Progressing   Problem: Education: Goal: Knowledge of disease or condition will improve Outcome: Progressing Goal: Understanding of medication regimen will improve Outcome: Progressing Goal: Individualized Educational Video(s) Outcome: Progressing   Problem: Activity: Goal: Ability to tolerate increased activity will improve Outcome: Progressing   Problem: Cardiac: Goal: Ability to achieve and maintain adequate cardiopulmonary perfusion will improve Outcome: Progressing   Problem: Health Behavior/Discharge Planning: Goal: Ability to safely manage health-related needs after discharge will improve Outcome: Progressing

## 2023-11-02 NOTE — Consult Note (Addendum)
 Cardiology Consult    Patient ID: [name] MRN: [id number], DOB/AGE: [id number]   Admit date: 11/02/2023 Date of Consult: 11/02/2023  Primary Physician: Geofm Glade PARAS, MD Primary Cardiologist: Lonni Cash, MD   Requesting Provider: GORMAN. Debby, MD  Patient Profile    [name] is a 53 y.o. female with a history of chest pain, normal coronary arteries, anxiety, depression, hypertension, diastolic dysfunction, obesity, family history of CAD, family history of atrial fibrillation, fibromyalgia, irritable bowel syndrome, lupus, GERD, PCOS, arthritis, asthma, and prediabetes who is being seen today for the evaluation of new onset atrial fibrillation at the request of Dr. Debby.  Past Medical History   Subjective  Past Medical History:  Diagnosis Date   Anemia    Anxiety    Arthritis    inflammitory   Asthma    B12 deficiency    Back pain    Chest pain    a. 09/2011 ETT: Ex time 4:13, max HR 152. No ST/T changes-->low risk; b. 06/2021 Cor CTA: Ca2+ = 0. Nl cors.   Chronic joint pain    Constipation    DDD (degenerative disc disease), lumbar    low back and neck and shoulders   Depression    during divorce & legal matters   Diastolic dysfunction    a. 09/2011 Echo: EF 60-65%, no rwma; b. 06/2021 Echo: EF 55-60%, no rwma, GrI DD, nl RV fxn.   Fatty liver    Fibromyalgia    Gallbladder problem    GERD (gastroesophageal reflux disease)    History of kidney stones    Dr rosalynn Likens   History of polycystic ovarian disease S/P BSO   Hx of anxiety disorder    Hypertension    a   Hypothyroidism    no meds   IBS (irritable bowel syndrome)    Infertility, female    Joint pain    Lactose intolerance    Lupus hx positive ANA   followed by Dr Jon Jacob; possible lupus   Multiple food allergies    Myalgia    Osteoarthritis    Polyarthritis, 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HCC)    POLYCYSTIC OVARIAN DISEASE 03/02/2007   Qualifier: Diagnosis of  By: Tish MD, Elsie POUR BSO for cysts & endometriosis; Dr Thyra, Gyn Seeing Dr Nathanel Bunker     PONV (postoperative nausea and vomiting)    Pre-diabetes    Sweating profusely    Symptomatic mammary hypertrophy    URI (upper respiratory infection)    currently on cefdinir     Past Surgical History:  Procedure Laterality Date   ABDOMINAL HYSTERECTOMY     ANKLE SURGERY     X3   APPENDECTOMY  1991   exploratory lap   BACK SURGERY     BREAST REDUCTION SURGERY Bilateral 03/26/2015   Procedure: BILATERAL BREAST REDUCTION ;  Surgeon: Estefana GORMAN Fritter, DO;  Location: [facility];  Service: Plastics;  Laterality: Bilateral;   CARPAL TUNNEL RELEASE     bilateral; Dr Alix   COLONOSCOPY     in 1990s   CYSTO/ RIGHT RETROGRADE URETERAL PYELOGRAM  07/09/2004   HX BILATERAL RENAL STONES/ RIGHT FLANK PAIN   CYSTOSCOPY W/ URETERAL STENT PLACEMENT  11/12/2011   Procedure: CYSTOSCOPY WITH RETROGRADE PYELOGRAM/URETERAL STENT PLACEMENT;  Surgeon: Ricardo Likens, MD;  Location: [facility];  Service: Urology;  Laterality: Right;   CYSTOSCOPY W/ URETERAL STENT REMOVAL  12/07/2011   Procedure: CYSTOSCOPY WITH STENT REMOVAL;  Surgeon: Ricardo Likens,  MD;  Location: [facility];  Service: Urology;  Laterality: Right;   CYSTOSCOPY/RETROGRADE/URETEROSCOPY/STONE EXTRACTION WITH BASKET  12/07/2011   Procedure: CYSTOSCOPY/RETROGRADE/URETEROSCOPY/STONE EXTRACTION WITH BASKET;  Surgeon: Ricardo Likens, MD;  Location: [facility];  Service: Urology;  Laterality: Right;   DILATION AND CURETTAGE OF UTERUS     X5   EXCISION LEFT BARTHOLIN GLAND  02/05/2002   EYE SURGERY     Retinal tears surgery bilateral   LAPAROSCOPIC ASSISTED VAGINAL HYSTERECTOMY  2000   LAPAROSCOPIC CHOLECYSTECTOMY  05/31/2007   LAPAROSCOPIC LASER ABLATION ENDOMETRIOSIS AND LEFT SALPINGO-OOPHORECTOMY  11/03/1999   LAPAROSCOPY WITH RIGHT SALPINGO-OOPHECTOMY/ LYSIS ADHESIONS AND ABLATION ENDOMETRIOSIS  05/17/2001    LIPOSUCTION Bilateral 03/26/2015   Procedure: WITH LIPOSUCTION;  Surgeon: Estefana GORMAN Fritter, DO;  Location: [facility];  Service: Plastics;  Laterality: Bilateral;   LUMBAR LAMINECTOMY  2003   L4 - L5   NECK SURGERY  2017   PLANTAR FASCIA SURGERY     right   PLANTAR FASCIA SURGERY  02/2011   left   REDUCTION MAMMAPLASTY  2016   RIGHT URETEROSCOPIC STONE EXTRACTION  08/04/2000   SHOULDER ARTHROSCOPY WITH ROTATOR CUFF REPAIR Right 05/11/2020   Procedure: SHOULDER ARTHROSCOPY WITH ROTATOR CUFF REPAIR;  Surgeon: Leora Lynwood SAUNDERS, MD;  Location: [facility];  Service: Orthopedics;  Laterality: Right;   TONSILLECTOMY     TOTAL HIP ARTHROPLASTY Left 01/11/2022   Procedure: TOTAL HIP ARTHROPLASTY ANTERIOR APPROACH;  Surgeon: Leora Lynwood SAUNDERS, MD;  Location: [facility];  Service: Orthopedics;  Laterality: Left;   URETERAL STENT PLACEMENT  11/12/2011   Dr Likens     Allergies  Allergies  Allergen Reactions   Chlorhexidine  Gluconate Rash, Itching and Swelling    Developed severe rash where chloraprep was used on chest area   Ivp Dye [Iodinated Contrast Media] Rash and Other (See Comments)    Flushing, minor facial rash & dyspnea   Nalbuphine Shortness Of Breath and Rash    Nubain caused respiratory distress & rash   Septra [Sulfamethoxazole-Trimethoprim] Shortness Of Breath and Rash   Adhesive [Tape] Rash   Cymbalta  [Duloxetine  Hcl]     Had drastic mood changes   Diclofenac Other (See Comments)    GI Upset   Humira [Adalimumab]     Did not like the way I felt   Methotrexate Derivatives     GI upset     Augmentin  [Amoxicillin -Pot Clavulanate] Nausea And Vomiting   Betadine [Povidone Iodine] Rash   Latex Rash   Otezla [Apremilast] Rash       History of Present Illness    53 y.o. female with a history of chest pain, normal coronary arteries, anxiety, depression, hypertension, diastolic dysfunction, family history of CAD, family history of atrial fibrillation, obesity,  GERD, fibromyalgia, irritable bowel syndrome, lupus, PCOS, arthritis, asthma, and prediabetes.  She has been followed in our [place] office for many years, with echocardiogram in June 2013 showing normal LV function.  Exercise treadmill testing at that time showed fair exercise tolerance, walking 4 minutes and 13 seconds, without acute ST-T changes.  More recently, in March 2023, she underwent coronary CT angiogram which showed normal coronary arteries with a calcium score of 0.  Echocardiogram at that time showed an EF of 55 to 60% with grade 1 diastolic dysfunction and no significant valvular disease.  She was last seen in cardiology clinic in February of 2024, at which time she was doing well.  Ms. Helmuth was in her usual state of  health until the afternoon of July 30, when while mowing her lawn, she noted some palpitations.  This seemed to resolve.  Early this morning, she awoke suddenly with tachypalpitations and subsequently presented to the emergency department where ECG showed atrial fibrillation at 123 bpm without acute ST or T changes.  She was afebrile and normotensive.  Lab work was relatively unremarkable with normal troponins and mildly elevated WBC at 12.0.  TSH is normal at 2.910.  Chest x-ray showed no acute disease.  She was initially placed on intravenous diltiazem  with improved rate control.  This was subsequently discontinued and oral metoprolol  has been provided.  She has also been placed on Eliquis .  She remains in atrial fibrillation in the 70s to 80s but is currently asymptomatic.  Inpatient Medications   Subjective    apixaban   5 mg Oral BID   citalopram   40 mg Oral Daily   famotidine   20 mg Oral BID   lubiprostone   24 mcg Oral BID WC   metoprolol  tartrate  50 mg Oral BID    Family History    Family History  Problem Relation Age of Onset   Hypertension Mother    Colon polyps Mother    Atrial fibrillation Mother        Dr Verlin   Hyperlipidemia Mother    Heart  disease Mother    Anxiety disorder Mother    Sleep apnea Mother    Obesity Mother    Heart attack Father 103   Diabetes Father    Hypertension Father    Hyperlipidemia Father    Heart disease Father    Stroke Father    Obesity Father    Hypertension Sister    Heart attack Paternal Grandmother 36   Breast cancer Paternal Aunt 33   Colon cancer Maternal Uncle    Colon cancer Paternal Uncle    Colon polyps Maternal Grandmother    Stroke Maternal Grandmother 4   Diabetes Paternal Grandfather    Stroke Maternal Grandfather 37   She indicated that her mother is alive. She indicated that her father is alive. She indicated that the status of her sister is unknown. She indicated that the status of her maternal grandmother is unknown. She indicated that the status of her maternal grandfather is unknown. She indicated that the status of her paternal grandmother is unknown. She indicated that the status of her paternal grandfather is unknown. She indicated that the status of her maternal uncle is unknown. She indicated that the status of her paternal aunt is unknown. She indicated that the status of her paternal uncle is unknown.   Social History    Social History   Socioeconomic History   Marital status: Married    Spouse name: Caron   Number of children: 0   Years of education: college   Highest education level: Some college, no degree  Occupational History   Occupation: disabled   Occupation: disabled  Tobacco Use   Smoking status: Never    Passive exposure: Never   Smokeless tobacco: Never  Vaping Use   Vaping status: Never Used  Substance and Sexual Activity   Alcohol use: Yes    Comment: rarely   Drug use: No   Sexual activity: Yes    Birth control/protection: Surgical  Other Topics Concern   Not on file  Social History Narrative   Patient Lives at home with her husband Sandor)   Disabled.   Education two years of college.   Right handed.  Caffeine coffee and  sweet tea. Not daily.   Social Drivers of Corporate investment banker Strain: Low Risk  (06/30/2023)   Overall Financial Resource Strain (CARDIA)    Difficulty of Paying Living Expenses: Not hard at all  Food Insecurity: No Food Insecurity (11/02/2023)   Hunger Vital Sign    Worried About Running Out of Food in the Last Year: Never true    Ran Out of Food in the Last Year: Never true  Transportation Needs: No Transportation Needs (11/02/2023)   PRAPARE - Administrator, Civil Service (Medical): No    Lack of Transportation (Non-Medical): No  Physical Activity: Insufficiently Active (06/30/2023)   Exercise Vital Sign    Days of Exercise per Week: 5 days    Minutes of Exercise per Session: 10 min  Stress: Stress Concern Present (06/30/2023)   [employer] of Occupational Health - Occupational Stress Questionnaire    Feeling of Stress : Rather much  Social Connections: Socially Integrated (11/02/2023)   Social Connection and Isolation Panel    Frequency of Communication with Friends and Family: More than three times a week    Frequency of Social Gatherings with Friends and Family: More than three times a week    Attends Religious Services: More than 4 times per year    Active Member of [employer] or Organizations: Yes    Attends Engineer, structural: More than 4 times per year    Marital Status: Married  Catering manager Violence: Not At Risk (11/02/2023)   Humiliation, Afraid, Rape, and Kick questionnaire    Fear of Current or Ex-Partner: No    Emotionally Abused: No    Physically Abused: No    Sexually Abused: No     Review of Systems    General:  No chills, fever, night sweats or weight changes.  Cardiovascular:  No chest pain, dyspnea on exertion, edema, orthopnea, +++ palpitations, no paroxysmal nocturnal dyspnea. Dermatological: No rash, lesions/masses Respiratory: No cough, dyspnea Urologic: No hematuria, dysuria Abdominal:   No nausea, vomiting,  diarrhea, bright red blood per rectum, melena, or hematemesis Neurologic:  No visual changes, wkns, changes in mental status. All other systems reviewed and are otherwise negative except as noted above.     Objective   Physical Exam    Blood pressure 122/83, pulse 69, temperature 97.8 F (36.6 C), temperature source Oral, resp. rate 19, height 5' 8 (1.727 m), weight 85.3 kg, SpO2 100%.  General: Pleasant, NAD Psych: Normal affect. Neuro: Alert and oriented X 3. Moves all extremities spontaneously. HEENT: Normal  Neck: Supple without bruits or JVD. Lungs:  Resp regular and unlabored, CTA. Heart: Irregularly irregular, no s3, s4, or murmurs. Abdomen: Soft, non-tender, non-distended, BS + x 4.  Extremities: No clubbing, cyanosis or edema. DP/PT2+, Radials 2+ and equal bilaterally.  Labs    Cardiac Enzymes Recent Labs  Lab 11/02/23 0321 11/02/23 0522  TROPONINIHS 10 10     Lab Results  Component Value Date   WBC 12.0 (H) 11/02/2023   HGB 12.5 11/02/2023   HCT 38.3 11/02/2023   MCV 86.5 11/02/2023   PLT 259 11/02/2023    Recent Labs  Lab 11/02/23 0321  NA 143  K 4.2  CL 110  CO2 23  BUN 23*  CREATININE 0.85  CALCIUM 9.2  GLUCOSE 111*   Lab Results  Component Value Date   CHOL 171 09/08/2023   HDL 65.80 09/08/2023   LDLCALC 94 09/08/2023   TRIG 52.0  09/08/2023   Lab Results  Component Value Date   DDIMER 0.47 11/02/2023      Radiology Studies    DG Chest 1 View Result Date: 11/02/2023 CLINICAL DATA:  Chest pain and cardiac palpitations EXAM: PORTABLE CHEST 1 VIEW COMPARISON:  04/19/2015 FINDINGS: The heart size and mediastinal contours are within normal limits. Both lungs are clear. The visualized skeletal structures show postsurgical changes in the cervical spine. IMPRESSION: No active disease. Electronically Signed   By: Oneil Devonshire M.D.   On: 11/02/2023 03:37      ECG & Cardiac Imaging    Atrial fibrillation, 123, no acute ST or T changes-  personally reviewed.  Assessment & Plan    1.  Atrial fibrillation with rapid ventricular response: Patient presented to the emergency department early this morning with tachypalpitations and new onset atrial fibrillation.  Rates improved significantly with IV diltiazem  and currently she is on oral beta-blocker with rates in the 70s to 80s.  She is now asymptomatic.  Will provide an additional dose of metoprolol  this morning and change to 50 mg twice daily.  Eliquis  has been ordered (CHA2DS2-VASc equals 2).  2D echocardiogram ordered and pending.  Lab work unremarkable.  Provided that echo looks okay and rates are well-controlled/she remains asymptomatic, she could potentially be discharged home later today with plan for outpatient follow-up in 2 to 3 weeks and cardioversion if she remains in atrial fibrillation.  Will need outpatient sleep eval.  2.  Primary hypertension: Blood pressure currently stable.  She was on lisinopril  at home.  Holding in the setting of new metoprolol  therapy.   Risk Assessment/Risk Scores:          CHA2DS2-VASc Score = 2   This indicates a 2.2% annual risk of stroke. The patient's score is based upon: CHF History: 0 HTN History: 1 Diabetes History: 0 Stroke History: 0 Vascular Disease History: 0 Age Score: 0 Gender Score: 1      Signed, Lonni Meager, NP 11/02/2023, 12:10 PM  For questions or updates, please contact   Please consult [email or web address] for contact info under Cardiology/STEMI.

## 2023-11-02 NOTE — ED Notes (Signed)
 Patient refusing pepcid  at this time stating she only takes that at night at 2000. Patient requesting IV to RAC be discontinued due to discomfort. Also requesting to be unhooked from the monitor so she can ambulate to restroom.

## 2023-11-02 NOTE — ED Notes (Signed)
 Patient ambulatory to restroom and back to ER stretcher without incident. VSS, CCM in use, call light within reach. No other needs identified at this time.

## 2023-11-03 ENCOUNTER — Telehealth: Payer: Self-pay | Admitting: Internal Medicine

## 2023-11-03 ENCOUNTER — Other Ambulatory Visit: Payer: Self-pay | Admitting: Internal Medicine

## 2023-11-03 DIAGNOSIS — R7303 Prediabetes: Secondary | ICD-10-CM | POA: Diagnosis not present

## 2023-11-03 DIAGNOSIS — I4891 Unspecified atrial fibrillation: Secondary | ICD-10-CM | POA: Diagnosis not present

## 2023-11-03 DIAGNOSIS — I10 Essential (primary) hypertension: Secondary | ICD-10-CM | POA: Diagnosis not present

## 2023-11-03 DIAGNOSIS — I480 Paroxysmal atrial fibrillation: Secondary | ICD-10-CM | POA: Diagnosis not present

## 2023-11-03 LAB — COMPREHENSIVE METABOLIC PANEL WITH GFR
ALT: 29 U/L (ref 0–44)
AST: 24 U/L (ref 15–41)
Albumin: 3.3 g/dL — ABNORMAL LOW (ref 3.5–5.0)
Alkaline Phosphatase: 77 U/L (ref 38–126)
Anion gap: 7 (ref 5–15)
BUN: 27 mg/dL — ABNORMAL HIGH (ref 6–20)
CO2: 23 mmol/L (ref 22–32)
Calcium: 8.9 mg/dL (ref 8.9–10.3)
Chloride: 110 mmol/L (ref 98–111)
Creatinine, Ser: 0.79 mg/dL (ref 0.44–1.00)
GFR, Estimated: >60 mL/min (ref >60)
Glucose, Bld: 102 mg/dL — ABNORMAL HIGH (ref 70–99)
Potassium: 4.2 mmol/L (ref 3.5–5.1)
Sodium: 140 mmol/L (ref 135–145)
Total Bilirubin: 0.5 mg/dL (ref 0.0–1.2)
Total Protein: 6.3 g/dL — ABNORMAL LOW (ref 6.5–8.1)

## 2023-11-03 LAB — HIV ANTIBODY (ROUTINE TESTING W REFLEX): HIV Screen 4th Generation wRfx: NONREACTIVE

## 2023-11-03 LAB — CBC
HCT: 38.3 % (ref 36.0–46.0)
Hemoglobin: 12.2 g/dL (ref 12.0–15.0)
MCH: 28 pg (ref 26.0–34.0)
MCHC: 31.9 g/dL (ref 30.0–36.0)
MCV: 88 fL (ref 80.0–100.0)
Platelets: 261 K/uL (ref 150–400)
RBC: 4.35 MIL/uL (ref 3.87–5.11)
RDW: 13.8 % (ref 11.5–15.5)
WBC: 10.2 K/uL (ref 4.0–10.5)
nRBC: 0 % (ref 0.0–0.2)

## 2023-11-03 MED ORDER — METOPROLOL TARTRATE 75 MG PO TABS: 75.0000 mg | ORAL_TABLET | Freq: Two times a day (BID) | ORAL | 2 refills | Status: DC

## 2023-11-03 MED ORDER — APIXABAN 5 MG PO TABS: 5.0000 mg | ORAL_TABLET | Freq: Two times a day (BID) | ORAL | 2 refills | Status: DC

## 2023-11-03 MED ORDER — ALPRAZOLAM 0.25 MG PO TABS: 0.2500 mg | ORAL_TABLET | Freq: Every day | ORAL | 0 refills | Status: DC | PRN

## 2023-11-03 MED ORDER — DIPHENHYDRAMINE-APAP (SLEEP) 25-500 MG PO TABS: 2.0000 | ORAL_TABLET | Freq: Every evening | ORAL | Status: AC | PRN

## 2023-11-03 NOTE — Plan of Care (Signed)
  Problem: Education: Goal: Knowledge of General Education information will improve Description: Including pain rating scale, medication(s)/side effects and non-pharmacologic comfort measures Outcome: Progressing   Problem: Health Behavior/Discharge Planning: Goal: Ability to manage health-related needs will improve Outcome: Progressing   Problem: Clinical Measurements: Goal: Ability to maintain clinical measurements within normal limits will improve Outcome: Progressing   Problem: Clinical Measurements: Goal: Will remain free from infection Outcome: Progressing   Problem: Clinical Measurements: Goal: Cardiovascular complication will be avoided Outcome: Progressing   Problem: Activity: Goal: Risk for activity intolerance will decrease Outcome: Progressing

## 2023-11-03 NOTE — Plan of Care (Signed)
 Problem: Education: Goal: Knowledge of General Education information will improve Description: Including pain rating scale, medication(s)/side effects and non-pharmacologic comfort measures 11/03/2023 1145 by Arloa Dene KIDD, RN Outcome: Adequate for Discharge 11/03/2023 1022 by Arloa Dene KIDD, RN Outcome: Progressing   Problem: Health Behavior/Discharge Planning: Goal: Ability to manage health-related needs will improve 11/03/2023 1145 by Arloa Dene KIDD, RN Outcome: Adequate for Discharge 11/03/2023 1022 by Arloa Dene KIDD, RN Outcome: Progressing   Problem: Clinical Measurements: Goal: Ability to maintain clinical measurements within normal limits will improve 11/03/2023 1145 by Arloa Dene KIDD, RN Outcome: Adequate for Discharge 11/03/2023 1022 by Arloa Dene KIDD, RN Outcome: Progressing Goal: Will remain free from infection 11/03/2023 1145 by Arloa Dene KIDD, RN Outcome: Adequate for Discharge 11/03/2023 1022 by Arloa Dene KIDD, RN Outcome: Progressing Goal: Diagnostic test results will improve 11/03/2023 1145 by Arloa Dene KIDD, RN Outcome: Adequate for Discharge 11/03/2023 1022 by Arloa Dene KIDD, RN Outcome: Progressing Goal: Respiratory complications will improve 11/03/2023 1145 by Arloa Dene KIDD, RN Outcome: Adequate for Discharge 11/03/2023 1022 by Arloa Dene KIDD, RN Outcome: Progressing Goal: Cardiovascular complication will be avoided 11/03/2023 1145 by Arloa Dene KIDD, RN Outcome: Adequate for Discharge 11/03/2023 1022 by Arloa Dene KIDD, RN Outcome: Progressing   Problem: Activity: Goal: Risk for activity intolerance will decrease 11/03/2023 1145 by Arloa Dene KIDD, RN Outcome: Adequate for Discharge 11/03/2023 1022 by Arloa Dene KIDD, RN Outcome: Progressing   Problem: Nutrition: Goal: Adequate nutrition will be maintained 11/03/2023 1145 by Arloa Dene KIDD, RN Outcome: Adequate for Discharge 11/03/2023 1022 by Arloa Dene KIDD, RN Outcome: Progressing   Problem: Coping: Goal: Level of anxiety will decrease 11/03/2023 1145 by Arloa Dene KIDD, RN Outcome: Adequate for Discharge 11/03/2023 1022 by Arloa Dene KIDD, RN Outcome: Progressing   Problem: Elimination: Goal: Will not experience complications related to bowel motility 11/03/2023 1145 by Arloa Dene KIDD, RN Outcome: Adequate for Discharge 11/03/2023 1022 by Arloa Dene KIDD, RN Outcome: Progressing Goal: Will not experience complications related to urinary retention 11/03/2023 1145 by Arloa Dene KIDD, RN Outcome: Adequate for Discharge 11/03/2023 1022 by Arloa Dene KIDD, RN Outcome: Progressing   Problem: Pain Managment: Goal: General experience of comfort will improve and/or be controlled 11/03/2023 1145 by Arloa Dene KIDD, RN Outcome: Adequate for Discharge 11/03/2023 1022 by Arloa Dene KIDD, RN Outcome: Progressing   Problem: Safety: Goal: Ability to remain free from injury will improve 11/03/2023 1145 by Arloa Dene KIDD, RN Outcome: Adequate for Discharge 11/03/2023 1022 by Arloa Dene KIDD, RN Outcome: Progressing   Problem: Skin Integrity: Goal: Risk for impaired skin integrity will decrease 11/03/2023 1145 by Arloa Dene KIDD, RN Outcome: Adequate for Discharge 11/03/2023 1022 by Arloa Dene KIDD, RN Outcome: Progressing   Problem: Education: Goal: Ability to describe self-care measures that may prevent or decrease complications (Diabetes Survival Skills Education) will improve 11/03/2023 1145 by Arloa Dene KIDD, RN Outcome: Adequate for Discharge 11/03/2023 1022 by Arloa Dene KIDD, RN Outcome: Progressing Goal: Individualized Educational Video(s) 11/03/2023 1145 by Arloa Dene KIDD, RN Outcome: Adequate for Discharge 11/03/2023 1022 by Arloa Dene KIDD, RN Outcome: Progressing   Problem: Coping: Goal: Ability to adjust to condition or change in health will improve 11/03/2023 1145 by Arloa Dene KIDD,  RN Outcome: Adequate for Discharge 11/03/2023 1022 by Arloa Dene KIDD, RN Outcome: Progressing   Problem: Fluid Volume: Goal: Ability to maintain a balanced intake and output will improve 11/03/2023 1145 by Arloa Dene KIDD, RN Outcome: Adequate for Discharge 11/03/2023  1022 by Arloa Dene KIDD, RN Outcome: Progressing   Problem: Health Behavior/Discharge Planning: Goal: Ability to identify and utilize available resources and services will improve 11/03/2023 1145 by Arloa Dene KIDD, RN Outcome: Adequate for Discharge 11/03/2023 1022 by Arloa Dene KIDD, RN Outcome: Progressing Goal: Ability to manage health-related needs will improve 11/03/2023 1145 by Arloa Dene KIDD, RN Outcome: Adequate for Discharge 11/03/2023 1022 by Arloa Dene KIDD, RN Outcome: Progressing   Problem: Metabolic: Goal: Ability to maintain appropriate glucose levels will improve 11/03/2023 1145 by Arloa Dene KIDD, RN Outcome: Adequate for Discharge 11/03/2023 1022 by Arloa Dene KIDD, RN Outcome: Progressing   Problem: Nutritional: Goal: Maintenance of adequate nutrition will improve 11/03/2023 1145 by Arloa Dene KIDD, RN Outcome: Adequate for Discharge 11/03/2023 1022 by Arloa Dene KIDD, RN Outcome: Progressing Goal: Progress toward achieving an optimal weight will improve 11/03/2023 1145 by Arloa Dene KIDD, RN Outcome: Adequate for Discharge 11/03/2023 1022 by Arloa Dene KIDD, RN Outcome: Progressing   Problem: Skin Integrity: Goal: Risk for impaired skin integrity will decrease 11/03/2023 1145 by Arloa Dene KIDD, RN Outcome: Adequate for Discharge 11/03/2023 1022 by Arloa Dene KIDD, RN Outcome: Progressing   Problem: Tissue Perfusion: Goal: Adequacy of tissue perfusion will improve 11/03/2023 1145 by Arloa Dene KIDD, RN Outcome: Adequate for Discharge 11/03/2023 1022 by Arloa Dene KIDD, RN Outcome: Progressing   Problem: Education: Goal: Knowledge of disease or condition  will improve 11/03/2023 1145 by Arloa Dene KIDD, RN Outcome: Adequate for Discharge 11/03/2023 1022 by Arloa Dene KIDD, RN Outcome: Progressing Goal: Understanding of medication regimen will improve 11/03/2023 1145 by Arloa Dene KIDD, RN Outcome: Adequate for Discharge 11/03/2023 1022 by Arloa Dene KIDD, RN Outcome: Progressing Goal: Individualized Educational Video(s) 11/03/2023 1145 by Arloa Dene KIDD, RN Outcome: Adequate for Discharge 11/03/2023 1022 by Arloa Dene KIDD, RN Outcome: Progressing   Problem: Activity: Goal: Ability to tolerate increased activity will improve 11/03/2023 1145 by Arloa Dene KIDD, RN Outcome: Adequate for Discharge 11/03/2023 1022 by Arloa Dene KIDD, RN Outcome: Progressing   Problem: Cardiac: Goal: Ability to achieve and maintain adequate cardiopulmonary perfusion will improve 11/03/2023 1145 by Arloa Dene KIDD, RN Outcome: Adequate for Discharge 11/03/2023 1022 by Arloa Dene KIDD, RN Outcome: Progressing   Problem: Health Behavior/Discharge Planning: Goal: Ability to safely manage health-related needs after discharge will improve 11/03/2023 1145 by Arloa Dene KIDD, RN Outcome: Adequate for Discharge 11/03/2023 1022 by Arloa Dene KIDD, RN Outcome: Progressing

## 2023-11-03 NOTE — Care Management Obs Status (Signed)
 MEDICARE OBSERVATION STATUS NOTIFICATION   Patient Details  Name: [name] MRN: [id number] Date of Birth: March 16, 1971   Medicare Observation Status Notification Given:  No (patient did not want a copy)    Rojelio SHAUNNA Rattler 11/03/2023, 11:42 AM

## 2023-11-03 NOTE — Plan of Care (Signed)
  Problem: Education: Goal: Knowledge of General Education information will improve Description: Including pain rating scale, medication(s)/side effects and non-pharmacologic comfort measures Outcome: Progressing   Problem: Health Behavior/Discharge Planning: Goal: Ability to manage health-related needs will improve Outcome: Progressing   Problem: Clinical Measurements: Goal: Ability to maintain clinical measurements within normal limits will improve Outcome: Progressing Goal: Will remain free from infection Outcome: Progressing Goal: Diagnostic test results will improve Outcome: Progressing Goal: Respiratory complications will improve Outcome: Progressing Goal: Cardiovascular complication will be avoided Outcome: Progressing   Problem: Activity: Goal: Risk for activity intolerance will decrease Outcome: Progressing   Problem: Nutrition: Goal: Adequate nutrition will be maintained Outcome: Progressing   Problem: Coping: Goal: Level of anxiety will decrease Outcome: Progressing   Problem: Elimination: Goal: Will not experience complications related to bowel motility Outcome: Progressing Goal: Will not experience complications related to urinary retention Outcome: Progressing   Problem: Pain Managment: Goal: General experience of comfort will improve and/or be controlled Outcome: Progressing   Problem: Safety: Goal: Ability to remain free from injury will improve Outcome: Progressing   Problem: Skin Integrity: Goal: Risk for impaired skin integrity will decrease Outcome: Progressing   Problem: Education: Goal: Ability to describe self-care measures that may prevent or decrease complications (Diabetes Survival Skills Education) will improve Outcome: Progressing Goal: Individualized Educational Video(s) Outcome: Progressing   Problem: Coping: Goal: Ability to adjust to condition or change in health will improve Outcome: Progressing   Problem: Fluid  Volume: Goal: Ability to maintain a balanced intake and output will improve Outcome: Progressing   Problem: Health Behavior/Discharge Planning: Goal: Ability to identify and utilize available resources and services will improve Outcome: Progressing Goal: Ability to manage health-related needs will improve Outcome: Progressing   Problem: Metabolic: Goal: Ability to maintain appropriate glucose levels will improve Outcome: Progressing   Problem: Nutritional: Goal: Maintenance of adequate nutrition will improve Outcome: Progressing Goal: Progress toward achieving an optimal weight will improve Outcome: Progressing   Problem: Skin Integrity: Goal: Risk for impaired skin integrity will decrease Outcome: Progressing   Problem: Tissue Perfusion: Goal: Adequacy of tissue perfusion will improve Outcome: Progressing   Problem: Education: Goal: Knowledge of disease or condition will improve Outcome: Progressing Goal: Understanding of medication regimen will improve Outcome: Progressing Goal: Individualized Educational Video(s) Outcome: Progressing   Problem: Activity: Goal: Ability to tolerate increased activity will improve Outcome: Progressing   Problem: Cardiac: Goal: Ability to achieve and maintain adequate cardiopulmonary perfusion will improve Outcome: Progressing   Problem: Health Behavior/Discharge Planning: Goal: Ability to safely manage health-related needs after discharge will improve Outcome: Progressing

## 2023-11-03 NOTE — Progress Notes (Addendum)
 Cardiology Progress Note   Patient Name: [name] Date of Encounter: 11/03/2023  Primary Cardiologist: Lonni Cash, MD  Subjective   Was in afib earlier this AM w/ rates into the 90's.  With this, she noted some dyspnea/fatigue w/ ambulating to bathroom.  She has since converted to sinus rhythm in the 80's to 90's. Objective   Inpatient Medications    Scheduled Meds:  apixaban   5 mg Oral BID   citalopram   40 mg Oral Daily   famotidine   20 mg Oral BID   lubiprostone   24 mcg Oral BID WC   metoprolol  tartrate  75 mg Oral BID   Continuous Infusions:  PRN Meds: acetaminophen , albuterol , ALPRAZolam , melatonin, metoprolol  tartrate, ondansetron  (ZOFRAN ) IV   Vital Signs    Vitals:   11/02/23 1800 11/02/23 2124 11/03/23 0058 11/03/23 0435  BP:  109/74 124/78 127/77  Pulse:  66 78 78  Resp: 17 18 18 18   Temp:  98.8 F (37.1 C) 98.2 F (36.8 C) 98.1 F (36.7 C)  TempSrc:      SpO2:  97% 97% 98%  Weight:      Height:        Intake/Output Summary (Last 24 hours) at 11/03/2023 0748 Last data filed at 11/02/2023 1824 Gross per 24 hour  Intake 286.67 ml  Output --  Net 286.67 ml   Filed Weights   11/02/23 0318  Weight: 85.3 kg    Physical Exam   GEN: Well nourished, well developed, in no acute distress.  HEENT: Grossly normal.  Neck: Supple, no JVD, carotid bruits, or masses. Cardiac: RRR, no murmurs, rubs, or gallops. No clubbing, cyanosis, edema.  Radials 2+, DP/PT 2+ and equal bilaterally.  Respiratory:  Respirations regular and unlabored, clear to auscultation bilaterally. GI: Soft, nontender, nondistended, BS + x 4. MS: no deformity or atrophy. Skin: warm and dry, no rash. Neuro:  Strength and sensation are intact. Psych: AAOx3.  Normal affect.  Labs    Chemistry Recent Labs  Lab 11/02/23 0321 11/03/23 0430  NA 143 140  K 4.2 4.2  CL 110 110  CO2 23 23  GLUCOSE 111* 102*  BUN 23* 27*  CREATININE 0.85 0.79  CALCIUM 9.2 8.9  PROT   --  6.3*  ALBUMIN  --  3.3*  AST  --  24  ALT  --  29  ALKPHOS  --  77  BILITOT  --  0.5  GFRNONAA >60 >60  ANIONGAP 10 7     Hematology Recent Labs  Lab 11/02/23 0321 11/03/23 0430  WBC 12.0* 10.2  RBC 4.43 4.35  HGB 12.5 12.2  HCT 38.3 38.3  MCV 86.5 88.0  MCH 28.2 28.0  MCHC 32.6 31.9  RDW 13.9 13.8  PLT 259 261    Cardiac Enzymes  Recent Labs  Lab 11/02/23 0321 11/02/23 0522  TROPONINIHS 10 10     DDimer  Recent Labs  Lab 11/02/23 0321  DDIMER 0.47     Lipids  Lab Results  Component Value Date   CHOL 171 09/08/2023   HDL 65.80 09/08/2023   LDLCALC 94 09/08/2023   TRIG 52.0 09/08/2023   CHOLHDL 3 09/08/2023    HbA1c  Lab Results  Component Value Date   HGBA1C 5.5 09/08/2023    Radiology    DG Chest 1 View Result Date: 11/02/2023 CLINICAL DATA:  Chest pain and cardiac palpitations EXAM: PORTABLE CHEST 1 VIEW COMPARISON:  04/19/2015 FINDINGS: The heart size and mediastinal contours are  within normal limits. Both lungs are clear. The visualized skeletal structures show postsurgical changes in the cervical spine. IMPRESSION: No active disease. Electronically Signed   By: Oneil Devonshire M.D.   On: 11/02/2023 03:37     Telemetry    Afib, 80's to 90's  RSR, 80's to 90's this AM - Personally Reviewed  Cardiac Studies   2D Echocardiogram 7.31.2025  1. Left ventricular ejection fraction, by estimation, is 55 to 60%. Left  ventricular ejection fraction by PLAX is 60 %. The left ventricle has  normal function. The left ventricle has no regional wall motion  abnormalities. There is mild left ventricular  hypertrophy. Left ventricular diastolic parameters are indeterminate.   2. Right ventricular systolic function is normal. The right ventricular  size is normal.   3. The mitral valve is normal in structure. No evidence of mitral valve  regurgitation. No evidence of mitral stenosis.   4. The aortic valve is normal in structure. Aortic valve  regurgitation is  not visualized. No aortic stenosis is present.   5. The inferior vena cava is normal in size with greater than 50%  respiratory variability, suggesting right atrial pressure of 3 mmHg.   6. Rhythm is atrial fibrillation, rate 70 to 100 _____________   Patient Profile     53 y.o. female female with a history of chest pain, normal coronary arteries, anxiety, depression, hypertension, diastolic dysfunction, obesity, family history of CAD, family history of atrial fibrillation, fibromyalgia, irritable bowel syndrome, lupus, GERD, PCOS, arthritis, asthma, and prediabetes who was admitted July 31 with new onset atrial fibrillation.  Assessment & Plan    1.  PAF:  pt admitted after awakening early on 7/31 w/ tachypalps and found to be in rapid afib.  Rate was easily controlled on IV dilt and she was subsequently changed to oral metoprolol .  She just converted to sinus rhythm.  Echo w/ nl EF, no significant valvular dzs.  Ok for d/c today on metoprolol  75 mg BID and eliquis  5 mg BID.  She has in-office f/u next week to re-eval and at that point, I will refer to pulm for sleep eval and EP for discussion re: long term Afib mgmt.  2.  Primary HTN:  stable on ? blocker therapy.  Hold on home dose of ARB for now.  3.  Pre diabetes:  A1c prev as high as 5.9, currently 5.5.  Says she is no longer on metformin  @ home.  Cont mounjaro .  4.  Obesity:  she is on mounjaro  and has lost at least 50 lbs w/ plan for more wt loss.  Discussed the importance of healthy wt in Afib mgmt.  Signed, Lonni Meager, NP  11/03/2023, 7:48 AM    For questions or updates, please contact   Please consult [email or web address] for contact info under Cardiology/STEMI.

## 2023-11-03 NOTE — Telephone Encounter (Unsigned)
 Copied from CRM [id number]. Topic: Clinical - Medication Question >> Nov 03, 2023 10:13 AM Gennette ORN wrote: Reason for CRM: Patient is calling to see if she can prescribed Xanax . She is getting discharged today.

## 2023-11-03 NOTE — Discharge Summary (Addendum)
 Physician Discharge Summary   [name]  female DOB: [birth year]  FMW:[id number]  PCP: Geofm Glade PARAS, MD  Admit date: 11/02/2023 Discharge date: 11/03/2023  Admitted From: home Disposition:  home Husband updated at bedside prior to discharge. CODE STATUS: Full code  Discharge Instructions     Amb referral to AFIB Clinic   Complete by: As directed       Hospital Course:  For full details, please see H&P, progress notes, consult notes and ancillary notes.  Briefly,  [name] is a 53 y.o. female with medical history significant of Anemia, Anxiety, Depression, Asthma, HTN, Hypothyroidism, Lupus, who presented to ED with complaints of palpitations intermittent x 24 hours which progression in severity with last episode that occurred am of presentation.   Paroxysmal Afib RVR  --no prior history  --Rate was easily controlled on IV dilt and she was subsequently changed to oral metoprolol . Pt converted to sinus rhythm.  Echo wnl EF, no significant valvular dzs. --discharged on metoprolol  75 mg BID and eliquis  5 mg BID.  --outpatient cardio f/u for referral to pulm for sleep eval and EP for discussion re: long term Afib mgmt.    Anemia  -at baseline    Anxiety/Depression  --not taking Wellbutrin  PTA --cont home Celexa  --due to recent stressors, Xanax  x5 tabs prescribed per pt request.  DMII --not taking metformin  PTA --cont home Mounjaro     HTN  --started on metoprolol  75 mg BID --d/c'ed home hydrochlorothiazide  and Lisinopril    Hx of Hypothyroidism -previously diagnosed however now with normal tsh without supplementation   Lupus -no active flare     Unless noted above, medications under STOP list are ones pt was not taking PTA.  Discharge Diagnoses:  Principal Problem:   Atrial fibrillation with RVR (HCC) Active Problems:   Prediabetes   Obesity without serious comorbidity     Discharge Instructions:  Allergies as of 11/03/2023        Reactions   Chlorhexidine  Gluconate Rash, Itching, Swelling   Developed severe rash where chloraprep was used on chest area   Ivp Dye [iodinated Contrast Media] Rash, Other (See Comments)   Flushing, minor facial rash & dyspnea   Nalbuphine Shortness Of Breath, Rash   Nubain caused respiratory distress & rash   Septra [sulfamethoxazole-trimethoprim] Shortness Of Breath, Rash   Adhesive [tape] Rash   Cymbalta  [duloxetine  Hcl]    Had drastic mood changes   Diclofenac Other (See Comments)   GI Upset   Humira [adalimumab]    Did not like the way I felt   Methotrexate Derivatives    GI upset    Augmentin  [amoxicillin -pot Clavulanate] Nausea And Vomiting   Betadine [povidone Iodine] Rash   Latex Rash   Otezla [apremilast] Rash        Medication List     STOP taking these medications    buPROPion  ER 100 MG 12 hr tablet Commonly known as: Wellbutrin  SR   hydrochlorothiazide  25 MG tablet Commonly known as: HYDRODIURIL    lisinopril  20 MG tablet Commonly known as: ZESTRIL    metFORMIN  500 MG tablet Commonly known as: GLUCOPHAGE    topiramate  50 MG tablet Commonly known as: Topamax        TAKE these medications    acetaminophen  650 MG CR tablet Commonly known as: TYLENOL  Take 1,300 mg by mouth every 8 (eight) hours as needed for pain.   albuterol  108 (90 Base) MCG/ACT inhaler Commonly known as: VENTOLIN  HFA Inhale 2 puffs into the lungs every 6 (  six) hours as needed for wheezing or shortness of breath. TAKE 2 PUFFS BY MOUTH EVERY 6 HOURS AS NEEDED FOR WHEEZE OR SHORTNESS OF BREATH   apixaban  5 MG Tabs tablet Commonly known as: ELIQUIS  Take 1 tablet (5 mg total) by mouth 2 (two) times daily.   aspirin  81 MG chewable tablet Chew 1 tablet (81 mg total) by mouth 2 (two) times daily.   celecoxib  100 MG capsule Commonly known as: CELEBREX  Take 100 mg by mouth daily.   citalopram  40 MG tablet Commonly known as: CELEXA  TAKE 1 TABLET BY MOUTH EVERY DAY    cyclobenzaprine  10 MG tablet Commonly known as: FLEXERIL  Take 10 mg by mouth at bedtime.   diphenhydramine -acetaminophen  25-500 MG Tabs tablet Commonly known as: TYLENOL  PM Take 2 tablets by mouth at bedtime as needed. Home med. What changed:  when to take this reasons to take this additional instructions   famotidine  20 MG tablet Commonly known as: PEPCID  Take 1 tablet (20 mg total) by mouth 2 (two) times daily. TAKE 1 TABLET BY MOUTH EVERYDAY AT BEDTIME   lubiprostone  24 MCG capsule Commonly known as: AMITIZA  Take 1 capsule (24 mcg total) by mouth 2 (two) times daily with a meal.   Melatonin 5 MG Chew Chew 1 tablet by mouth as needed.   Metoprolol  Tartrate 75 MG Tabs Take 1 tablet (75 mg total) by mouth 2 (two) times daily.   Skyrizi Pen 150 MG/ML pen Generic drug: risankizumab-rzaa Inject into the skin.         Follow-up Information     Vivienne Lonni Ingle, NP Follow up on 11/09/2023.   Specialties: Cardiology, Radiology Why: 8:00 AM Contact information: [address] [place] [address] [phone]                 Allergies  Allergen Reactions   Chlorhexidine  Gluconate Rash, Itching and Swelling    Developed severe rash where chloraprep was used on chest area   Ivp Dye [Iodinated Contrast Media] Rash and Other (See Comments)    Flushing, minor facial rash & dyspnea   Nalbuphine Shortness Of Breath and Rash    Nubain caused respiratory distress & rash   Septra [Sulfamethoxazole-Trimethoprim] Shortness Of Breath and Rash   Adhesive [Tape] Rash   Cymbalta  [Duloxetine  Hcl]     Had drastic mood changes   Diclofenac Other (See Comments)    GI Upset   Humira [Adalimumab]     Did not like the way I felt   Methotrexate Derivatives     GI upset     Augmentin  [Amoxicillin -Pot Clavulanate] Nausea And Vomiting   Betadine [Povidone Iodine] Rash   Latex Rash   Otezla [Apremilast] Rash     The results of significant  diagnostics from this hospitalization (including imaging, microbiology, ancillary and laboratory) are listed below for reference.   Consultations:   Procedures/Studies: ECHOCARDIOGRAM COMPLETE Result Date: 11/02/2023    ECHOCARDIOGRAM REPORT   Patient Name:   [name] Date of Exam: 11/02/2023 Medical Rec #:  [id number]        Height:       68.0 in Accession #:    [id number]       Weight:       188.0 lb Date of Birth:  1970/05/31        BSA:          1.991 m Patient Age:    52 years         BP:  127/101 mmHg Patient Gender: F                HR:           104 bpm. Exam Location:  [facility] Procedure: 2D Echo, Cardiac Doppler and Color Doppler (Both Spectral and Color            Flow Doppler were utilized during procedure). Indications:     Atrial Fibrillation I48.91  History:         Patient has prior history of Echocardiogram examinations, most                  recent 06/09/2021. Signs/Symptoms:Chest Pain; Risk                  Factors:Hypertension. Anxiety.  Sonographer:     Christopher Furnace Referring Phys:  [id number] EVALENE JINNY LUNGER Diagnosing Phys: EVALENE LUNGER MD IMPRESSIONS  1. Left ventricular ejection fraction, by estimation, is 55 to 60%. Left ventricular ejection fraction by PLAX is 60 %. The left ventricle has normal function. The left ventricle has no regional wall motion abnormalities. There is mild left ventricular hypertrophy. Left ventricular diastolic parameters are indeterminate.  2. Right ventricular systolic function is normal. The right ventricular size is normal.  3. The mitral valve is normal in structure. No evidence of mitral valve regurgitation. No evidence of mitral stenosis.  4. The aortic valve is normal in structure. Aortic valve regurgitation is not visualized. No aortic stenosis is present.  5. The inferior vena cava is normal in size with greater than 50% respiratory variability, suggesting right atrial pressure of 3 mmHg.  6. Rhythm is atrial fibrillation, rate 70 to 100  FINDINGS  Left Ventricle: Left ventricular ejection fraction, by estimation, is 55 to 60%. Left ventricular ejection fraction by PLAX is 60 %. The left ventricle has normal function. The left ventricle has no regional wall motion abnormalities. Strain was performed and the global longitudinal strain is indeterminate. The left ventricular internal cavity size was normal in size. There is mild left ventricular hypertrophy. Left ventricular diastolic parameters are indeterminate. Right Ventricle: The right ventricular size is normal. No increase in right ventricular wall thickness. Right ventricular systolic function is normal. Left Atrium: Left atrial size was normal in size. Right Atrium: Right atrial size was normal in size. Pericardium: There is no evidence of pericardial effusion. Mitral Valve: The mitral valve is normal in structure. No evidence of mitral valve regurgitation. No evidence of mitral valve stenosis. Tricuspid Valve: The tricuspid valve is normal in structure. Tricuspid valve regurgitation is not demonstrated. No evidence of tricuspid stenosis. Aortic Valve: The aortic valve is normal in structure. Aortic valve regurgitation is not visualized. No aortic stenosis is present. Aortic valve mean gradient measures 4.5 mmHg. Aortic valve peak gradient measures 8.2 mmHg. Aortic valve area, by VTI measures 2.48 cm. Pulmonic Valve: The pulmonic valve was normal in structure. Pulmonic valve regurgitation is not visualized. No evidence of pulmonic stenosis. Aorta: The aortic root is normal in size and structure. Venous: The inferior vena cava is normal in size with greater than 50% respiratory variability, suggesting right atrial pressure of 3 mmHg. IAS/Shunts: No atrial level shunt detected by color flow Doppler. Additional Comments: 3D was performed not requiring image post processing on an independent workstation and was indeterminate.  LEFT VENTRICLE PLAX 2D LV EF:         Left ventricular ejection  fraction by PLAX is 60 %. LVIDd:  4.10 cm LVIDs:         2.80 cm LV PW:         1.00 cm LV IVS:        1.30 cm LVOT diam:     2.00 cm LV SV:         58 LV SV Index:   29 LVOT Area:     3.14 cm  RIGHT VENTRICLE RV Basal diam:  2.40 cm RV Mid diam:    1.90 cm RV S prime:     12.50 cm/s TAPSE (M-mode): 2.2 cm LEFT ATRIUM             Index        RIGHT ATRIUM          Index LA diam:        2.80 cm 1.41 cm/m   RA Area:     5.42 cm LA Vol (A2C):   16.8 ml 8.44 ml/m   RA Volume:   5.36 ml  2.69 ml/m LA Vol (A4C):   24.2 ml 12.16 ml/m LA Biplane Vol: 21.1 ml 10.60 ml/m  AORTIC VALVE AV Area (Vmax):    2.21 cm AV Area (Vmean):   2.34 cm AV Area (VTI):     2.48 cm AV Vmax:           143.50 cm/s AV Vmean:          98.900 cm/s AV VTI:            0.234 m AV Peak Grad:      8.2 mmHg AV Mean Grad:      4.5 mmHg LVOT Vmax:         101.00 cm/s LVOT Vmean:        73.600 cm/s LVOT VTI:          0.185 m LVOT/AV VTI ratio: 0.79  AORTA Ao Root diam: 3.60 cm MITRAL VALVE MV Area (PHT): 3.79 cm     SHUNTS MV Decel Time: 200 msec     Systemic VTI:  0.18 m MV E velocity: 133.00 cm/s  Systemic Diam: 2.00 cm Evalene Lunger MD Electronically signed by Evalene Lunger MD Signature Date/Time: 11/02/2023/3:58:07 PM    Final    DG Chest 1 View Result Date: 11/02/2023 CLINICAL DATA:  Chest pain and cardiac palpitations EXAM: PORTABLE CHEST 1 VIEW COMPARISON:  04/19/2015 FINDINGS: The heart size and mediastinal contours are within normal limits. Both lungs are clear. The visualized skeletal structures show postsurgical changes in the cervical spine. IMPRESSION: No active disease. Electronically Signed   By: Oneil Devonshire M.D.   On: 11/02/2023 03:37      Labs: BNP (last 3 results) No results for input(s): BNP in the last 8760 hours. Basic Metabolic Panel: Recent Labs  Lab 11/02/23 0321 11/03/23 0430  NA 143 140  K 4.2 4.2  CL 110 110  CO2 23 23  GLUCOSE 111* 102*  BUN 23* 27*  CREATININE 0.85 0.79  CALCIUM 9.2  8.9  MG 2.1  --    Liver Function Tests: Recent Labs  Lab 11/03/23 0430  AST 24  ALT 29  ALKPHOS 77  BILITOT 0.5  PROT 6.3*  ALBUMIN 3.3*   No results for input(s): LIPASE, AMYLASE in the last 168 hours. No results for input(s): AMMONIA in the last 168 hours. CBC: Recent Labs  Lab 11/02/23 0321 11/03/23 0430  WBC 12.0* 10.2  HGB 12.5 12.2  HCT 38.3 38.3  MCV 86.5 88.0  PLT  259 261   Cardiac Enzymes: No results for input(s): CKTOTAL, CKMB, CKMBINDEX, TROPONINI in the last 168 hours. BNP: Invalid input(s): POCBNP CBG: Recent Labs  Lab 11/02/23 0954 11/02/23 1253  GLUCAP 101* 132*   D-Dimer No results for input(s): DDIMER in the last 72 hours.  Hgb A1c No results for input(s): HGBA1C in the last 72 hours. Lipid Profile No results for input(s): CHOL, HDL, LDLCALC, TRIG, CHOLHDL, LDLDIRECT in the last 72 hours. Thyroid  function studies No results for input(s): TSH, T4TOTAL, T3FREE, THYROIDAB in the last 72 hours.  Invalid input(s): FREET3  Anemia work up No results for input(s): VITAMINB12, FOLATE, FERRITIN, TIBC, IRON, RETICCTPCT in the last 72 hours. Urinalysis    Component Value Date/Time   COLORURINE COLORLESS (A) 11/02/2023 0347   APPEARANCEUR CLEAR (A) 11/02/2023 0347   LABSPEC 1.003 (L) 11/02/2023 0347   PHURINE 7.0 11/02/2023 0347   GLUCOSEU NEGATIVE 11/02/2023 0347   GLUCOSEU NEGATIVE 04/28/2020 1444   HGBUR NEGATIVE 11/02/2023 0347   BILIRUBINUR NEGATIVE 11/02/2023 0347   BILIRUBINUR neg 02/16/2017 1018   KETONESUR NEGATIVE 11/02/2023 0347   PROTEINUR NEGATIVE 11/02/2023 0347   UROBILINOGEN 0.2 04/28/2020 1444   NITRITE NEGATIVE 11/02/2023 0347   LEUKOCYTESUR NEGATIVE 11/02/2023 0347   Sepsis Labs Recent Labs  Lab 11/02/23 0321 11/03/23 0430  WBC 12.0* 10.2   Microbiology No results found for this or any previous visit (from the past 240 hours).   Total time spend on discharging  this patient, including the last patient exam, discussing the hospital stay, instructions for ongoing care as it relates to all pertinent caregivers, as well as preparing the medical discharge records, prescriptions, and/or referrals as applicable, is 40 minutes.    Ellouise Haber, MD  [facility] 11/08/2023, 9:33 PM

## 2023-11-06 ENCOUNTER — Encounter (INDEPENDENT_AMBULATORY_CARE_PROVIDER_SITE_OTHER): Payer: Self-pay | Admitting: Family Medicine

## 2023-11-06 ENCOUNTER — Ambulatory Visit (INDEPENDENT_AMBULATORY_CARE_PROVIDER_SITE_OTHER): Admitting: Family Medicine

## 2023-11-06 VITALS — BP 131/80 | HR 76 | Temp 99.1°F | Ht 68.0 in | Wt 206.0 lb

## 2023-11-06 DIAGNOSIS — F419 Anxiety disorder, unspecified: Secondary | ICD-10-CM | POA: Diagnosis not present

## 2023-11-06 DIAGNOSIS — E669 Obesity, unspecified: Secondary | ICD-10-CM

## 2023-11-06 DIAGNOSIS — F418 Other specified anxiety disorders: Secondary | ICD-10-CM

## 2023-11-06 DIAGNOSIS — E119 Type 2 diabetes mellitus without complications: Secondary | ICD-10-CM | POA: Diagnosis not present

## 2023-11-06 DIAGNOSIS — E559 Vitamin D deficiency, unspecified: Secondary | ICD-10-CM

## 2023-11-06 DIAGNOSIS — Z6831 Body mass index (BMI) 31.0-31.9, adult: Secondary | ICD-10-CM | POA: Diagnosis not present

## 2023-11-06 DIAGNOSIS — E1169 Type 2 diabetes mellitus with other specified complication: Secondary | ICD-10-CM

## 2023-11-06 DIAGNOSIS — F339 Major depressive disorder, recurrent, unspecified: Secondary | ICD-10-CM

## 2023-11-06 DIAGNOSIS — I4891 Unspecified atrial fibrillation: Secondary | ICD-10-CM | POA: Diagnosis not present

## 2023-11-06 DIAGNOSIS — Z7985 Long-term (current) use of injectable non-insulin antidiabetic drugs: Secondary | ICD-10-CM | POA: Diagnosis not present

## 2023-11-06 MED ORDER — TIRZEPATIDE 15 MG/0.5ML ~~LOC~~ SOAJ: 15.0000 mg | SUBCUTANEOUS | 0 refills | Status: DC

## 2023-11-06 MED ORDER — VITAMIN D (ERGOCALCIFEROL) 1.25 MG (50000 UNIT) PO CAPS: 50000.0000 [IU] | ORAL_CAPSULE | ORAL | 0 refills | Status: DC

## 2023-11-06 NOTE — Progress Notes (Unsigned)
 Cardiology Office Note:  .   Date:  11/09/2023  ID:  [name], DOB 10/07/1970, MRN [id number] PCP: Geofm Glade PARAS, MD  [facility] Providers Cardiologist:  Lonni Cash, MD   History of Present Illness: [name] is a 53 y.o. female with history of PAF, hypertension, PCOS, lupus, asthma, anxiety, depression.     PAF Newly diagnosed 10/2023, had recent steroid injection, complained of palpitations, started beta-blocker and converted to sinus.  EF normal.  Other Coronary CTA 06/2021 with no evidence of CAD, CAC 0  Social history  Multiple stressors currently.  Has been recently hospitalized for SBO, father is critically ill with pulmonary fibrosis and with palliative care. Active and walking twice a week     Patient recently admitted 10/2023 for A-fib RVR with acute complaints of persistent palpitations.  She converted back to sinus rhythm on metoprolol .  Echo with preserved EF with no significant valvular disease.  Discharged on Lopressor  75 mg twice daily and Eliquis  twice daily.  ARB was held at discharge.  Today patient presents after hospitalization.  Overall feeling well, does not feel that she has had another recurrence of atrial fibrillation.  When she is in A-fib she feels palpitations but no chest pain or shortness of breath.  She reports multiple stressors currently. Multiple family members are sick. Husband was just admitted with SBO and father's critically ill with palliative care.  Otherwise reports no acute concerns.  Remains compliant with all of her medications.  ROS: Denies: Chest pain, shortness of breath, orthopnea, peripheral edema, palpitations, decreased exercise intolerance, fatigue, lightheadedness.   Studies Reviewed: SABRA    EKG Interpretation Date/Time:  Thursday November 09 2023 08:16:04 EDT Ventricular Rate:  72 PR Interval:  154 QRS Duration:  86 QT Interval:  380 QTC Calculation: 416 R Axis:   14  Text  Interpretation: Normal sinus rhythm Possible Anterior infarct , age undetermined When compared with ECG of 03-Nov-2023 09:38, No significant change was found Confirmed by Darryle Currier [phone]) on 11/09/2023 8:23:53 AM    Risk Assessment/Calculations:    CHA2DS2-VASc Score = 2   This indicates a 2.2% annual risk of stroke. The patient's score is based upon: CHF History: 0 HTN History: 1 Diabetes History: 0 Stroke History: 0 Vascular Disease History: 0 Age Score: 0 Gender Score: 1     Physical Exam:   VS:  BP 124/76   Pulse 72   Ht 5' 8 (1.727 m)   Wt 210 lb 3.2 oz (95.3 kg)   SpO2 99%   BMI 31.96 kg/m    Wt Readings from Last 3 Encounters:  11/09/23 210 lb 3.2 oz (95.3 kg)  11/06/23 206 lb (93.4 kg)  11/02/23 188 lb (85.3 kg)    GEN: Well nourished, well developed in no acute distress NECK: No JVD; No carotid bruits CARDIAC: RRR, no murmurs, rubs, gallops RESPIRATORY:  Clear to auscultation without rales, wheezing or rhonchi  ABDOMEN: Soft, non-tender, non-distended EXTREMITIES:  No edema; No deformity   ASSESSMENT AND PLAN: .    PAF Has recent admission with RVR/palpitations.  Converted on metoprolol .  Echo was normal with no significant valvular disease.  Today remains to be in sinus rhythm.   Continue Eliquis  5 mg twice daily, Lopressor  75 mg twice daily. She has reported some fatigue since starting her Lopressor , discussed with her cutting back the dose.  She would like to see how she tolerates this after little bit more time.  She  will call back if she would like to make any changes. TSH normal Stopping her NSAIDs as this is high risk for bleeding, follows with rheumatology for lupus.  Will defer to them about other agents. Refer to electrophysiology for consideration of ablation if she has recurrence. 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  Suspected OSA STOP-BANG is 4, referring to pulmonology for sleep study  Hypertension Well-controlled.   Not on any other medications besides Lopressor .  Prediabetes A1c 5.5% On metformin .  Continue Mounjaro .  BMI 31 Working with PCP with diet plan.  On Mounjaro .   Hypothyroidism She has history of this but TSH is normal and she is no longer on Synthroid .   Dispo: Follow-up with electrophysiology to consider ablation.  Defer to them if they feel the need to send back to general cardiology.  Signed, Thom LITTIE Sluder, PA-C

## 2023-11-06 NOTE — Progress Notes (Signed)
 Office: [phone]  /  Fax: [phone]  WEIGHT SUMMARY AND BIOMETRICS  Anthropometric Measurements Height: 5' 8 (1.727 m) Weight: 206 lb (93.4 kg) BMI (Calculated): 31.33 Weight at Last Visit: 195lb Weight Lost Since Last Visit: 0lb Weight Gained Since Last Visit: 11lb Starting Weight: 241lb Total Weight Loss (lbs): 35 lb (15.9 kg)   No data recorded Other Clinical Data Fasting: No Labs: No Today's Visit #: 71 Starting Date: 09/28/16    Chief Complaint: OBESITY   Discussed the use of AI scribe software for clinical note transcription with the patient, who gave verbal consent to proceed.  History of Present Illness [name] is a 53 year old female who presents for obesity treatment plan assessment and progress evaluation.  She has been prescribed a dietary plan consisting of 1500 calories and 85 or more grams of protein. However, due to personal stressors, she has struggled to adhere to this plan and has not focused on weight loss. She is attempting to increase her hydration and intake of protein, fruits, and vegetables but is skipping meals and not achieving 7 to 9 hours of sleep per night. She is not currently engaging in physical exercise and has gained 11 pounds over the last three months since her last visit.  In addition to obesity, she is being treated for vitamin D  deficiency with vitamin D  50,000 international units per week and requests a refill.  She is managing type 2 diabetes through diet, exercise, weight loss, and Mounjaro  15 mg per week. She reports stress eating and consuming iced coffees, which she acknowledges are not healthy choices.  She was recently hospitalized for atrial fibrillation after experiencing a heart rate of 157 bpm that would not decrease. She was admitted to [facility] from Wednesday to Friday and is now on Eliquis  and metoprolol . She has not had any more episodes since discharge. She occasionally feels  winded, which she attributes to asthma and heat exposure. She also received steroid injections in her shoulder and hip last Monday.  Her family is experiencing significant stress. Her husband was hospitalized for a small bowel obstruction, and her father is critically ill with pulmonary fibrosis and a partially collapsed lung, receiving palliative care. Her mother is also experiencing stress, and her sister has been assisting with family care. Her father's weight has decreased to 116 pounds, and he is consuming protein shakes and soft foods due to dental issues.      PHYSICAL EXAM:  Blood pressure 131/80, pulse 76, temperature 99.1 F (37.3 C), height 5' 8 (1.727 m), weight 206 lb (93.4 kg), SpO2 99%. Body mass index is 31.32 kg/m.  DIAGNOSTIC DATA REVIEWED:  BMET    Component Value Date/Time   NA 140 11/03/2023 0430   NA 143 04/19/2023 1134   K 4.2 11/03/2023 0430   CL 110 11/03/2023 0430   CO2 23 11/03/2023 0430   GLUCOSE 102 (H) 11/03/2023 0430   BUN 27 (H) 11/03/2023 0430   BUN 19 04/19/2023 1134   CREATININE 0.79 11/03/2023 0430   CALCIUM 8.9 11/03/2023 0430   GFRNONAA >60 11/03/2023 0430   GFRAA 125 11/11/2019 1049   Lab Results  Component Value Date   HGBA1C 5.5 09/08/2023   HGBA1C 6.1 09/22/2010   Lab Results  Component Value Date   INSULIN  19.3 04/19/2023   INSULIN  14.7 09/28/2016   Lab Results  Component Value Date   TSH 2.910 11/02/2023   CBC    Component Value Date/Time  WBC 10.2 11/03/2023 0430   RBC 4.35 11/03/2023 0430   HGB 12.2 11/03/2023 0430   HGB 12.1 12/09/2021 1442   HCT 38.3 11/03/2023 0430   HCT 37.1 12/09/2021 1442   PLT 261 11/03/2023 0430   PLT 276 12/09/2021 1442   MCV 88.0 11/03/2023 0430   MCV 87 12/09/2021 1442   MCH 28.0 11/03/2023 0430   MCHC 31.9 11/03/2023 0430   RDW 13.8 11/03/2023 0430   RDW 13.4 12/09/2021 1442   Iron Studies No results found for: IRON, TIBC, FERRITIN, IRONPCTSAT Lipid Panel      Component Value Date/Time   CHOL 171 09/08/2023 0959   CHOL 186 04/19/2023 1134   TRIG 52.0 09/08/2023 0959   HDL 65.80 09/08/2023 0959   HDL 68 04/19/2023 1134   CHOLHDL 3 09/08/2023 0959   VLDL 10.4 09/08/2023 0959   LDLCALC 94 09/08/2023 0959   LDLCALC 103 (H) 04/19/2023 1134   Hepatic Function Panel     Component Value Date/Time   PROT 6.3 (L) 11/03/2023 0430   PROT 6.8 04/19/2023 1134   ALBUMIN 3.3 (L) 11/03/2023 0430   ALBUMIN 4.2 04/19/2023 1134   AST 24 11/03/2023 0430   ALT 29 11/03/2023 0430   ALKPHOS 77 11/03/2023 0430   BILITOT 0.5 11/03/2023 0430   BILITOT 0.3 04/19/2023 1134   BILIDIR 0.1 06/12/2013 1632   IBILI 0.2 06/11/2009 2309      Component Value Date/Time   TSH 2.910 11/02/2023 0321   TSH 2.67 09/08/2023 0959   Nutritional Lab Results  Component Value Date   VD25OH 41.54 09/08/2023   VD25OH 50.9 04/19/2023   VD25OH 64.8 12/20/2022     Assessment and Plan Assessment & Plan Obesity Obesity management is challenging due to recent life stressors, including family health issues, resulting in an 11-pound weight gain over the last three months. She is not adhering to the prescribed diet plan of 1500 calories and 85 grams of protein per day, is skipping meals, not sleeping adequately, and not exercising. She is consuming iced coffees and not journaling her intake. Muscle mass has increased, contributing to some weight gain. - Encourage hydration and increased protein intake - Advise to avoid skipping meals and aim for 7-9 hours of sleep per night - Encourage reduction of liquid calorie intake, limit iced coffees to three per week - Schedule follow-up in 8 weeks  Type 2 diabetes mellitus Type 2 diabetes is managed with diet, exercise, weight loss, and Mounjaro  15 mg per week. Recent stressors have impacted her ability to focus on diabetes management. - Refill Mounjaro  for 90 days - Schedule follow-up in 8 weeks  Vitamin D  deficiency Vitamin D   deficiency is treated with vitamin D  50,000 IU per week. - Refill vitamin D  50,000 IU per week  Atrial fibrillation Recent onset of atrial fibrillation, likely triggered by stress and recent steroid injections. Managed with Eliquis  and metoprolol . No further episodes since hospitalization. She reports feeling tired from medication. Discussed potential for ablation as a future option if appropriate. - Continue Eliquis  and metoprolol   Major depressive disorder and anxiety disorder Experiencing increased stress and anxiety due to family health issues. Currently on Celexa  40 mg, but reports it is not effective. - Contact primary care for potential adjustment of Celexa   - Continue to be mindful of not skipping meals and careful about giving into too much comfort eating     She was informed of the importance of frequent follow up visits to maximize her success with intensive  lifestyle modifications for her multiple health conditions.    Louann Penton, MD

## 2023-11-07 ENCOUNTER — Other Ambulatory Visit: Payer: Self-pay | Admitting: Internal Medicine

## 2023-11-07 MED ORDER — ALPRAZOLAM 0.25 MG PO TABS: 0.2500 mg | ORAL_TABLET | Freq: Every day | ORAL | 0 refills | Status: DC | PRN

## 2023-11-07 NOTE — Telephone Encounter (Signed)
 Copied from CRM #[id number]. Topic: Clinical - Medication Question >> Nov 07, 2023  8:24 AM Pinkey ORN wrote: Reason for CRM: ALPRAZolam  (XANAX ) 0.25 MG tablet >> Nov 07, 2023  8:24 AM Pinkey ORN wrote: Patient called in wanting to follow up on her request for ALPRAZolam  (XANAX ) 0.25 MG tablet. Patient called in on 11/03/23 about this medication and hasn't heard anything as of yet.

## 2023-11-07 NOTE — Telephone Encounter (Signed)
 Copied from CRM #[id number]. Topic: Clinical - Medication Refill >> Nov 07, 2023  8:22 AM Pinkey ORN wrote: Medication: ALPRAZolam  (XANAX ) 0.25 MG tablet  Has the patient contacted their pharmacy? Yes (Agent: If no, request that the patient contact the pharmacy for the refill. If patient does not wish to contact the pharmacy document the reason why and proceed with request.) (Agent: If yes, when and what did the pharmacy advise?)  This is the patient's preferred pharmacy:  [id number] - [place], [place] - [address] AT [facility] [address] [place] [address] Phone: [phone] Fax: [phone]   Is this the correct pharmacy for this prescription? Yes If no, delete pharmacy and type the correct one.   Has the prescription been filled recently? No  Is the patient out of the medication? Yes  Has the patient been seen for an appointment in the last year OR does the patient have an upcoming appointment? Yes  Can we respond through MyChart? Yes  Agent: Please be advised that Rx refills may take up to 3 business days. We ask that you follow-up with your pharmacy.

## 2023-11-09 ENCOUNTER — Ambulatory Visit: Attending: Nurse Practitioner | Admitting: Cardiology

## 2023-11-09 ENCOUNTER — Encounter: Payer: Self-pay | Admitting: Nurse Practitioner

## 2023-11-09 VITALS — BP 124/76 | HR 72 | Ht 68.0 in | Wt 210.2 lb

## 2023-11-09 DIAGNOSIS — G4733 Obstructive sleep apnea (adult) (pediatric): Secondary | ICD-10-CM

## 2023-11-09 DIAGNOSIS — I480 Paroxysmal atrial fibrillation: Secondary | ICD-10-CM

## 2023-11-09 DIAGNOSIS — I10 Essential (primary) hypertension: Secondary | ICD-10-CM | POA: Diagnosis not present

## 2023-11-09 NOTE — Patient Instructions (Signed)
 Medication Instructions:  Your physician recommends the following medication changes.  STOP TAKING: Aspirin  Celebrex    *If you need a refill on your cardiac medications before your next appointment, please call your pharmacy*  Lab Work: None ordered at this time  If you have labs (blood work) drawn today and your tests are completely normal, you will receive your results only by: MyChart Message (if you have MyChart) OR A paper copy in the mail If you have any lab test that is abnormal or we need to change your treatment, we will call you to review the results.  Testing/Procedures: None ordered at this time   Follow-Up: At [facility], you and your health needs are our priority.  As part of our continuing mission to provide you with exceptional heart care, our providers are all part of one team.  This team includes your primary Cardiologist (physician) and Advanced Practice Providers or APPs (Physician Assistants and Nurse Practitioners) who all work together to provide you with the care you need, when you need it.  Your next appointment:   With Electrophysiology  Other Instructions Referral to Pulmonology and Electrophysiology

## 2023-11-13 ENCOUNTER — Other Ambulatory Visit: Payer: Self-pay | Admitting: Physician Assistant

## 2023-11-13 ENCOUNTER — Ambulatory Visit: Admitting: Sleep Medicine

## 2023-11-14 ENCOUNTER — Ambulatory Visit: Admitting: Sleep Medicine

## 2023-11-28 ENCOUNTER — Encounter: Payer: Self-pay | Admitting: Internal Medicine

## 2023-11-30 MED ORDER — ALPRAZOLAM 0.25 MG PO TABS: 0.2500 mg | ORAL_TABLET | Freq: Every day | ORAL | 0 refills | Status: DC | PRN

## 2023-12-20 ENCOUNTER — Other Ambulatory Visit: Payer: Self-pay | Admitting: Internal Medicine

## 2023-12-25 NOTE — Progress Notes (Unsigned)
 Subjective:    Patient ID: [name], female    DOB: 22-May-1970, 53 y.o.   MRN: [id number]     HPI [name] is here for follow up of her chronic medical problems.  7/31 - palps x 24 hr - went to ED - diagnosed with Afib  Father died.   ? labs  Medications and allergies reviewed with patient and updated if appropriate.  Current Outpatient Medications on File Prior to Visit  Medication Sig Dispense Refill   acetaminophen  (TYLENOL ) 650 MG CR tablet Take 1,300 mg by mouth every 8 (eight) hours as needed for pain.     albuterol  (VENTOLIN  HFA) 108 (90 Base) MCG/ACT inhaler Inhale 2 puffs into the lungs every 6 (six) hours as needed for wheezing or shortness of breath. TAKE 2 PUFFS BY MOUTH EVERY 6 HOURS AS NEEDED FOR WHEEZE OR SHORTNESS OF BREATH 8.5 g 5   ALPRAZolam  (XANAX ) 0.25 MG tablet Take 1 tablet (0.25 mg total) by mouth daily as needed for anxiety. 30 tablet 0   apixaban  (ELIQUIS ) 5 MG TABS tablet Take 1 tablet (5 mg total) by mouth 2 (two) times daily. 60 tablet 2   citalopram  (CELEXA ) 40 MG tablet TAKE 1 TABLET BY MOUTH EVERY DAY 90 tablet 1   cyclobenzaprine  (FLEXERIL ) 10 MG tablet Take 10 mg by mouth at bedtime.     diphenhydramine -acetaminophen  (TYLENOL  PM) 25-500 MG TABS tablet Take 2 tablets by mouth at bedtime as needed. Home med.     famotidine  (PEPCID ) 20 MG tablet TAKE 1 TABLET BY MOUTH EVERYDAY AT BEDTIME 90 tablet 1   lubiprostone  (AMITIZA ) 24 MCG capsule Take 1 capsule (24 mcg total) by mouth 2 (two) times daily with a meal. 60 capsule 3   Melatonin 5 MG CHEW Chew 1 tablet by mouth as needed.     Metoprolol  Tartrate 75 MG TABS Take 1 tablet (75 mg total) by mouth 2 (two) times daily. 60 tablet 2   SKYRIZI PEN 150 MG/ML SOAJ Inject into the skin.     tirzepatide  (MOUNJARO ) 15 MG/0.5ML Pen Inject 15 mg into the skin once a week. 6 mL 0   Vitamin D , Ergocalciferol , (DRISDOL ) 1.25 MG (50000 UNIT) CAPS capsule Take 1 capsule (50,000 Units total) by mouth  every 7 (seven) days. 13 capsule 0   No current facility-administered medications on file prior to visit.     Review of Systems     Objective:  There were no vitals filed for this visit. BP Readings from Last 3 Encounters:  11/09/23 124/76  11/06/23 131/80  11/03/23 119/71   Wt Readings from Last 3 Encounters:  11/09/23 210 lb 3.2 oz (95.3 kg)  11/06/23 206 lb (93.4 kg)  11/02/23 188 lb (85.3 kg)   There is no height or weight on file to calculate BMI.    Physical Exam     Lab Results  Component Value Date   WBC 10.2 11/03/2023   HGB 12.2 11/03/2023   HCT 38.3 11/03/2023   PLT 261 11/03/2023   GLUCOSE 102 (H) 11/03/2023   CHOL 171 09/08/2023   TRIG 52.0 09/08/2023   HDL 65.80 09/08/2023   LDLCALC 94 09/08/2023   ALT 29 11/03/2023   AST 24 11/03/2023   NA 140 11/03/2023   K 4.2 11/03/2023   CL 110 11/03/2023   CREATININE 0.79 11/03/2023   BUN 27 (H) 11/03/2023   CO2 23 11/03/2023   TSH 2.910 11/02/2023   HGBA1C 5.5 09/08/2023  Assessment & Plan:    See Problem List for Assessment and Plan of chronic medical problems.

## 2023-12-26 ENCOUNTER — Encounter: Payer: Self-pay | Admitting: Internal Medicine

## 2023-12-26 ENCOUNTER — Ambulatory Visit: Admitting: Internal Medicine

## 2023-12-26 VITALS — BP 132/80 | HR 91 | Temp 101.3°F | Ht 68.0 in | Wt 220.0 lb

## 2023-12-26 DIAGNOSIS — I10 Essential (primary) hypertension: Secondary | ICD-10-CM | POA: Diagnosis not present

## 2023-12-26 DIAGNOSIS — E559 Vitamin D deficiency, unspecified: Secondary | ICD-10-CM | POA: Diagnosis not present

## 2023-12-26 DIAGNOSIS — R7303 Prediabetes: Secondary | ICD-10-CM

## 2023-12-26 DIAGNOSIS — F419 Anxiety disorder, unspecified: Secondary | ICD-10-CM

## 2023-12-26 DIAGNOSIS — K219 Gastro-esophageal reflux disease without esophagitis: Secondary | ICD-10-CM | POA: Diagnosis not present

## 2023-12-26 DIAGNOSIS — E782 Mixed hyperlipidemia: Secondary | ICD-10-CM | POA: Diagnosis not present

## 2023-12-26 DIAGNOSIS — J069 Acute upper respiratory infection, unspecified: Secondary | ICD-10-CM

## 2023-12-26 DIAGNOSIS — F418 Other specified anxiety disorders: Secondary | ICD-10-CM | POA: Diagnosis not present

## 2023-12-26 DIAGNOSIS — E538 Deficiency of other specified B group vitamins: Secondary | ICD-10-CM

## 2023-12-26 DIAGNOSIS — R52 Pain, unspecified: Secondary | ICD-10-CM | POA: Diagnosis not present

## 2023-12-26 DIAGNOSIS — I4891 Unspecified atrial fibrillation: Secondary | ICD-10-CM | POA: Diagnosis not present

## 2023-12-26 LAB — POC INFLUENZA A&B (BINAX/QUICKVUE)
Influenza A, POC: NEGATIVE
Influenza B, POC: NEGATIVE

## 2023-12-26 LAB — POC COVID19 BINAXNOW: SARS Coronavirus 2 Ag: NEGATIVE

## 2023-12-26 MED ORDER — LUBIPROSTONE 24 MCG PO CAPS: 24.0000 ug | ORAL_CAPSULE | Freq: Two times a day (BID) | ORAL | 3 refills | Status: DC

## 2023-12-26 MED ORDER — FAMOTIDINE 20 MG PO TABS: ORAL_TABLET | ORAL | 1 refills | Status: DC

## 2023-12-26 MED ORDER — ALPRAZOLAM 0.25 MG PO TABS: 0.2500 mg | ORAL_TABLET | Freq: Every day | ORAL | 1 refills | Status: DC | PRN

## 2023-12-26 NOTE — Assessment & Plan Note (Addendum)
 Chronic Diagnosed 3/31 in ED-had been having palpitations for 24 hours On Eliquis  5 mg twice daily, metoprolol  75 mg twice daily Has been feeling more cold and fatigue since starting his medications Following with cardiology

## 2023-12-26 NOTE — Patient Instructions (Addendum)
      Medications changes include :   None      Return in about 6 months (around 06/24/2024) for Physical Exam.

## 2023-12-26 NOTE — Assessment & Plan Note (Addendum)
Chronic Taking B12  

## 2023-12-26 NOTE — Assessment & Plan Note (Signed)
Chronic GERD controlled Continue Pepcid 20 mg nightly

## 2023-12-26 NOTE — Assessment & Plan Note (Signed)
 Chronic Increased recently secondary to her father's death Continue Celexa  40 mg daily, alprazolam  0.25 mg daily as needed

## 2023-12-26 NOTE — Assessment & Plan Note (Signed)
 Chronic Blood pressure controlled Continue lisinopril  20 mg daily Monitor BP at home

## 2023-12-26 NOTE — Assessment & Plan Note (Signed)
 Acute Symptoms consistent with viral URI COVID and flu test negative here Her symptoms really do sound viral so we will hold off on any antibiotics at this time She will update me if her symptoms are worsening or not improving over the next couple of days Symptomatic treatment

## 2023-12-26 NOTE — Assessment & Plan Note (Addendum)
Chronic Taking vitamin D daily 

## 2023-12-26 NOTE — Assessment & Plan Note (Addendum)
 Chronic LDL controlled Continue lifestyle control Regular exercise and healthy diet encouraged

## 2023-12-26 NOTE — Assessment & Plan Note (Signed)
 Chronic Lab Results  Component Value Date   HGBA1C 5.5 09/08/2023   Check a1c On Mounjaro , metformin  Low sugar / carb diet Stressed regular exercise

## 2023-12-27 ENCOUNTER — Telehealth: Payer: Self-pay | Admitting: Radiology

## 2023-12-27 MED ORDER — CEFDINIR 300 MG PO CAPS: 300.0000 mg | ORAL_CAPSULE | Freq: Two times a day (BID) | ORAL | 0 refills | Status: DC

## 2023-12-27 NOTE — Addendum Note (Signed)
 Addended by: GEOFM GLADE PARAS on: 12/27/2023 04:47 PM   Modules accepted: Orders

## 2023-12-27 NOTE — Telephone Encounter (Signed)
 Copied from CRM #[id number]. Topic: General - Other >> Dec 27, 2023 11:12 AM [place] A wrote: Reason for CRM: Patient wants to let Dr. Geofm know she is blowing green out of her nose now.

## 2023-12-27 NOTE — Telephone Encounter (Signed)
 Pt phoned in needing to be seen asap. She has all covid symptoms since around 04/01/2020. She stated that she had a covid test that was neg. At an urgent care. However our first availably is not until 05/01/2020. She stated she could not wait that long and her primary care Dr. Leroy not see her due to her symptoms. Pt will call them back to see if she can get some kind  of treatment and then call us  back if not.

## 2023-12-28 ENCOUNTER — Ambulatory Visit: Admitting: Physician Assistant

## 2023-12-29 ENCOUNTER — Ambulatory Visit: Admitting: Physician Assistant

## 2023-12-29 ENCOUNTER — Ambulatory Visit: Admitting: Internal Medicine

## 2023-12-31 ENCOUNTER — Encounter (INDEPENDENT_AMBULATORY_CARE_PROVIDER_SITE_OTHER): Payer: Self-pay | Admitting: Family Medicine

## 2024-01-01 ENCOUNTER — Ambulatory Visit (INDEPENDENT_AMBULATORY_CARE_PROVIDER_SITE_OTHER): Admitting: Family Medicine

## 2024-01-02 ENCOUNTER — Encounter (INDEPENDENT_AMBULATORY_CARE_PROVIDER_SITE_OTHER): Payer: Self-pay | Admitting: Family Medicine

## 2024-01-02 ENCOUNTER — Ambulatory Visit (INDEPENDENT_AMBULATORY_CARE_PROVIDER_SITE_OTHER): Admitting: Family Medicine

## 2024-01-02 VITALS — BP 133/84 | HR 73 | Temp 99.0°F | Ht 68.0 in | Wt 215.0 lb

## 2024-01-02 DIAGNOSIS — Z7985 Long-term (current) use of injectable non-insulin antidiabetic drugs: Secondary | ICD-10-CM

## 2024-01-02 DIAGNOSIS — E559 Vitamin D deficiency, unspecified: Secondary | ICD-10-CM | POA: Diagnosis not present

## 2024-01-02 DIAGNOSIS — F5089 Other specified eating disorder: Secondary | ICD-10-CM | POA: Diagnosis not present

## 2024-01-02 DIAGNOSIS — E1169 Type 2 diabetes mellitus with other specified complication: Secondary | ICD-10-CM

## 2024-01-02 DIAGNOSIS — Z6832 Body mass index (BMI) 32.0-32.9, adult: Secondary | ICD-10-CM

## 2024-01-02 DIAGNOSIS — E669 Obesity, unspecified: Secondary | ICD-10-CM

## 2024-01-02 DIAGNOSIS — I4891 Unspecified atrial fibrillation: Secondary | ICD-10-CM | POA: Diagnosis not present

## 2024-01-02 DIAGNOSIS — F4321 Adjustment disorder with depressed mood: Secondary | ICD-10-CM | POA: Diagnosis not present

## 2024-01-02 DIAGNOSIS — E119 Type 2 diabetes mellitus without complications: Secondary | ICD-10-CM | POA: Diagnosis not present

## 2024-01-02 DIAGNOSIS — Z639 Problem related to primary support group, unspecified: Secondary | ICD-10-CM | POA: Insufficient documentation

## 2024-01-02 MED ORDER — TIRZEPATIDE 15 MG/0.5ML ~~LOC~~ SOAJ: 15.0000 mg | SUBCUTANEOUS | 0 refills | Status: DC

## 2024-01-02 MED ORDER — VITAMIN D (ERGOCALCIFEROL) 1.25 MG (50000 UNIT) PO CAPS: 50000.0000 [IU] | ORAL_CAPSULE | ORAL | 0 refills | Status: DC

## 2024-01-02 MED ORDER — TOPIRAMATE 50 MG PO TABS: 50.0000 mg | ORAL_TABLET | Freq: Every day | ORAL | 0 refills | Status: DC

## 2024-01-02 NOTE — Progress Notes (Signed)
 Office: [phone]  /  Fax: [phone]  WEIGHT SUMMARY AND BIOMETRICS  Anthropometric Measurements Height: 5' 8 (1.727 m) Weight: 215 lb (97.5 kg) BMI (Calculated): 32.7 Weight at Last Visit: 206 lb Weight Lost Since Last Visit: 0 Weight Gained Since Last Visit: 9 lb Starting Weight: 241 lb Total Weight Loss (lbs): 26 lb (11.8 kg) Peak Weight: 251 lb   Body Composition  Body Fat %: 44.9 % Fat Mass (lbs): 96.8 lbs Muscle Mass (lbs): 112.8 lbs Total Body Water  (lbs): 84.2 lbs Visceral Fat Rating : 11   Other Clinical Data Fasting: no Labs: no Today's Visit #: 89 Starting Date: 09/28/16    Chief Complaint: OBESITY    History of Present Illness [name] is a 53 year old female who presents for obesity treatment and progress assessment.  Since her last visit two months ago, she has gained nine pounds, which she attributes to emotional eating following the recent death of her father. She has been unable to focus on diet or exercise due to the stress and responsibilities of caring for her mother, who is now living with her. She has been using Mounjaro  for weight management but experienced issues with defective pens, resulting in missed doses. Additionally, she has been consuming high-calorie foods and drinks, including soft drinks and large frappes, contributing to her weight gain. She has not been able to fit into her size twelve clothing, indicating an increase in body size.  She has a history of atrial fibrillation, diagnosed in August after experiencing a heart rate of 168 beats per minute. She was hospitalized and started on Eliquis  and metoprolol , which she reports makes her feel tired. She is concerned about the impact of these medications on her energy levels, especially as she manages her mother's care. There is a family history of atrial fibrillation, as both of her parents have been affected.  She is dealing with significant stress and grief  following her father's passing. She has been prescribed Xanax  to help manage her anxiety and sleep issues, but she uses it sparingly. She reports family conflict, particularly with her sister, which has added to her stress.  She has a history of arthritis and previously used Celebrex  for pain management, but she can no longer take it due to her current use of Eliquis . She experiences pain and swelling, particularly at night, which affects her sleep. She is seeking alternatives for pain management that are compatible with her current medications. She denies feeling sick despite a recent fever of 101F, which was investigated and found to be negative for COVID-19 and flu.      PHYSICAL EXAM:  Blood pressure 133/84, pulse 73, temperature 99 F (37.2 C), height 5' 8 (1.727 m), weight 215 lb (97.5 kg), SpO2 99%. Body mass index is 32.69 kg/m.  DIAGNOSTIC DATA REVIEWED:  BMET    Component Value Date/Time   NA 140 11/03/2023 0430   NA 143 04/19/2023 1134   K 4.2 11/03/2023 0430   CL 110 11/03/2023 0430   CO2 23 11/03/2023 0430   GLUCOSE 102 (H) 11/03/2023 0430   BUN 27 (H) 11/03/2023 0430   BUN 19 04/19/2023 1134   CREATININE 0.79 11/03/2023 0430   CALCIUM 8.9 11/03/2023 0430   GFRNONAA >60 11/03/2023 0430   GFRAA 125 11/11/2019 1049   Lab Results  Component Value Date   HGBA1C 5.5 09/08/2023   HGBA1C 6.1 09/22/2010   Lab Results  Component Value Date   INSULIN  19.3 04/19/2023  INSULIN  14.7 09/28/2016   Lab Results  Component Value Date   TSH 2.910 11/02/2023   CBC    Component Value Date/Time   WBC 10.2 11/03/2023 0430   RBC 4.35 11/03/2023 0430   HGB 12.2 11/03/2023 0430   HGB 12.1 12/09/2021 1442   HCT 38.3 11/03/2023 0430   HCT 37.1 12/09/2021 1442   PLT 261 11/03/2023 0430   PLT 276 12/09/2021 1442   MCV 88.0 11/03/2023 0430   MCV 87 12/09/2021 1442   MCH 28.0 11/03/2023 0430   MCHC 31.9 11/03/2023 0430   RDW 13.8 11/03/2023 0430   RDW 13.4 12/09/2021 1442    Iron Studies No results found for: IRON, TIBC, FERRITIN, IRONPCTSAT Lipid Panel     Component Value Date/Time   CHOL 171 09/08/2023 0959   CHOL 186 04/19/2023 1134   TRIG 52.0 09/08/2023 0959   HDL 65.80 09/08/2023 0959   HDL 68 04/19/2023 1134   CHOLHDL 3 09/08/2023 0959   VLDL 10.4 09/08/2023 0959   LDLCALC 94 09/08/2023 0959   LDLCALC 103 (H) 04/19/2023 1134   Hepatic Function Panel     Component Value Date/Time   PROT 6.3 (L) 11/03/2023 0430   PROT 6.8 04/19/2023 1134   ALBUMIN 3.3 (L) 11/03/2023 0430   ALBUMIN 4.2 04/19/2023 1134   AST 24 11/03/2023 0430   ALT 29 11/03/2023 0430   ALKPHOS 77 11/03/2023 0430   BILITOT 0.5 11/03/2023 0430   BILITOT 0.3 04/19/2023 1134   BILIDIR 0.1 06/12/2013 1632   IBILI 0.2 06/11/2009 2309      Component Value Date/Time   TSH 2.910 11/02/2023 0321   TSH 2.67 09/08/2023 0959   Nutritional Lab Results  Component Value Date   VD25OH 41.54 09/08/2023   VD25OH 50.9 04/19/2023   VD25OH 64.8 12/20/2022     Assessment and Plan Assessment & Plan Obesity with emotional eating Weight gain of nine pounds over the last two months, primarily due to emotional eating following the recent death of her father. Six pounds of the weight gain is attributed to fat. Emotional eating exacerbated by stress and grief. Mounjaro  injections have been inconsistent due to defective pens. Consumption of high-calorie drinks and foods, including soft drinks and large frappes, contributing to weight gain. - Restart topiramate  to help reduce cravings for soft drinks. - Advise reduction in high-calorie drink and food consumption. - Follow up in four weeks.  Vitamin D  deficiency She has been out of vitamin D  supplements for a couple of months. No recent lab checks for vitamin D  levels. - Restart vitamin D  supplementation. - Check vitamin D  levels in the next one to two months.  Grieving related to recent loss of father Grieving the recent loss of  her father, contributing to emotional distress and stress-related eating. Experiencing family conflict and stress related to her sister, which may be exacerbating grief. - Continue Xanax  as needed for anxiety and sleep, but advise against frequent use to avoid dependency. - Monitor emotional well-being and grief process.  Diabetes II Potential impact of weight gain and dietary habits on diabetes control. - Contact the manufacturer regarding defective Mounjaro  pens to potentially receive replacements. - Encourage resumption of regular Mounjaro  injections, refilled today.  Afib She notes fatigue, likely multifactorial, but may be exacerbated by beta blocker. - Follow up at Afib clinic and mention the fatigue, they may have other options available for you.  Follow-Up - Follow up in four weeks.  I personally spent a total of 46 minutes  in the care of the patient today including preparing to see the patient, performing a medically appropriate evaluation of current problems, placing orders in the EMR, documenting clinical information in the EMR, and discussing emotional eating behaviors and how to make strategies to change these behaviors.      Analysa was informed of the importance of frequent follow up visits to maximize her success with intensive lifestyle modifications for her obesity and obesity related health conditions as recommended by USPSTF and CMS guidelines   Louann Penton, MD

## 2024-01-02 NOTE — Progress Notes (Unsigned)
  Electrophysiology Office Note:    Date:  01/02/2024   ID:  [name], DOB [birth year], MRN [id number]  [facility] Cardiologist:  Lonni Cash, MD  [facility] Electrophysiologist:  OLE ONEIDA HOLTS, MD   Referring MD: Darryle Thom CROME, PA-C   Chief Complaint: Atrial fibrillation  History of Present Illness:    [name] is a 53 year old woman who I am seeing today for an evaluation of atrial fibrillation at the request of Thom.  The patient has a history of atrial fibrillation, suspected sleep apnea, hypertension, prediabetes and hypothyroidism.  She has been admitted in July of this year with atrial fibrillation with rapid ventricular rates.  She was started on metoprolol  and she takes Eliquis  for stroke prophylaxis.  She has worn an Apple watch in the past that recently caught her atrial fibrillation.      Their past medical, social and family history was reviewed.   ROS:   Please see the history of present illness.    All other systems reviewed and are negative.  EKGs/Labs/Other Studies Reviewed:    The following studies were reviewed today:  November 02, 2023 echo EF 55-60 RV normal  June 11, 2021 CTA coronary Calcium score 0  November 09, 2023 EKG shows sinus rhythm. November 02, 2023 EKG shows atrial fibrillation with a ventricular rate of 123 bpm        Physical Exam:    VS:  There were no vitals taken for this visit.    Wt Readings from Last 3 Encounters:  01/02/24 215 lb (97.5 kg)  12/26/23 220 lb (99.8 kg)  11/09/23 210 lb 3.2 oz (95.3 kg)     GEN: no distress CARD: RRR, No MRG RESP: No IWOB. CTAB.        ASSESSMENT AND PLAN:    No diagnosis found.  #Persistent atrial fibrillation Symptomatic.  Previously admitted with rapid ventricular rates.  On Eliquis  for stroke prophylaxis.  I discussed treatment options today including conservative management, antiarrhythmic drugs and catheter ablation.  Discussed treatment options today  for AF including antiarrhythmic drug therapy and ablation. Discussed risks, recovery and likelihood of success with each treatment strategy. Risk, benefits, and alternatives to EP study and ablation for afib were discussed. These risks include but are not limited to stroke, bleeding, vascular damage, tamponade, perforation, damage to the esophagus, lungs, phrenic nerve and other structures, pulmonary vein stenosis, worsening renal function, coronary vasospasm and death.  Discussed potential need for repeat ablation procedures and antiarrhythmic drugs after an initial ablation. The patient understands these risk and wishes to proceed.  We will therefore proceed with catheter ablation at the next available time.  Carto, ICE, anesthesia are requested for the procedure.    We will not obtain CT PV protocol prior to the procedure.       Signed, OLE ONEIDA. HOLTS, MD, [facility], [facility] 01/02/2024 8:21 PM    Electrophysiology [facility]

## 2024-01-03 ENCOUNTER — Encounter: Payer: Self-pay | Admitting: Cardiology

## 2024-01-03 ENCOUNTER — Ambulatory Visit: Attending: Cardiology | Admitting: Cardiology

## 2024-01-03 ENCOUNTER — Encounter: Payer: Self-pay | Admitting: Internal Medicine

## 2024-01-03 VITALS — BP 120/80 | HR 73 | Ht 68.0 in | Wt 218.0 lb

## 2024-01-03 DIAGNOSIS — I4819 Other persistent atrial fibrillation: Secondary | ICD-10-CM

## 2024-01-03 MED ORDER — APIXABAN 5 MG PO TABS: 5.0000 mg | ORAL_TABLET | Freq: Two times a day (BID) | ORAL | 2 refills | Status: DC

## 2024-01-03 MED ORDER — METOPROLOL SUCCINATE ER 50 MG PO TB24: 75.0000 mg | ORAL_TABLET | Freq: Two times a day (BID) | ORAL | 3 refills | Status: AC

## 2024-01-03 NOTE — Patient Instructions (Signed)
 Medication Instructions:  Your physician has recommended you make the following change in your medication:  1) STOP taking metoprolol  tartrate 2) START taking metoprolol  succinate 75 mg twice daily  *If you need a refill on your cardiac medications before your next appointment, please call your pharmacy*  Testing/Procedures: Home Sleep Study Your physician has recommended that you have a sleep study. This test records several body functions during sleep, including: brain activity, eye movement, oxygen and carbon dioxide blood levels, heart rate and rhythm, breathing rate and rhythm, the flow of air through your mouth and nose, snoring, body muscle movements, and chest and belly movement.  Follow-Up: At [facility], you and your health needs are our priority.  As part of our continuing mission to provide you with exceptional heart care, our providers are all part of one team.  This team includes your primary Cardiologist (physician) and Advanced Practice Providers or APPs (Physician Assistants and Nurse Practitioners) who all work together to provide you with the care you need, when you need it.  Your next appointment:   3 months  Provider:   Suzann Riddle, NP

## 2024-01-16 ENCOUNTER — Ambulatory Visit

## 2024-01-16 DIAGNOSIS — Z23 Encounter for immunization: Secondary | ICD-10-CM | POA: Diagnosis not present

## 2024-01-17 ENCOUNTER — Other Ambulatory Visit: Payer: Self-pay | Admitting: Internal Medicine

## 2024-01-17 DIAGNOSIS — L4050 Arthropathic psoriasis, unspecified: Secondary | ICD-10-CM

## 2024-01-22 DIAGNOSIS — M25551 Pain in right hip: Secondary | ICD-10-CM | POA: Diagnosis not present

## 2024-01-30 ENCOUNTER — Encounter (INDEPENDENT_AMBULATORY_CARE_PROVIDER_SITE_OTHER): Payer: Self-pay | Admitting: Family Medicine

## 2024-01-30 ENCOUNTER — Ambulatory Visit (INDEPENDENT_AMBULATORY_CARE_PROVIDER_SITE_OTHER): Admitting: Family Medicine

## 2024-01-30 VITALS — BP 130/85 | HR 81 | Temp 98.2°F | Ht 68.0 in | Wt 218.0 lb

## 2024-01-30 DIAGNOSIS — E1169 Type 2 diabetes mellitus with other specified complication: Secondary | ICD-10-CM

## 2024-01-30 DIAGNOSIS — E119 Type 2 diabetes mellitus without complications: Secondary | ICD-10-CM | POA: Diagnosis not present

## 2024-01-30 DIAGNOSIS — Z7985 Long-term (current) use of injectable non-insulin antidiabetic drugs: Secondary | ICD-10-CM

## 2024-01-30 DIAGNOSIS — E559 Vitamin D deficiency, unspecified: Secondary | ICD-10-CM | POA: Diagnosis not present

## 2024-01-30 DIAGNOSIS — Z6833 Body mass index (BMI) 33.0-33.9, adult: Secondary | ICD-10-CM | POA: Diagnosis not present

## 2024-01-30 DIAGNOSIS — E669 Obesity, unspecified: Secondary | ICD-10-CM | POA: Diagnosis not present

## 2024-01-30 DIAGNOSIS — I10 Essential (primary) hypertension: Secondary | ICD-10-CM | POA: Diagnosis not present

## 2024-01-30 NOTE — Progress Notes (Signed)
 Office: [phone]  /  Fax: [phone]  WEIGHT SUMMARY AND BIOMETRICS  Anthropometric Measurements Height: 5' 8 (1.727 m) Weight: 218 lb (98.9 kg) BMI (Calculated): 33.15 Weight at Last Visit: 215 lb Weight Lost Since Last Visit: 0 lb Weight Gained Since Last Visit: 3 lb Starting Weight: 241 lb Total Weight Loss (lbs): 23 lb (10.4 kg) Peak Weight: 251 lb   Body Composition  Body Fat %: 44.7 % Fat Mass (lbs): 97.4 lbs Muscle Mass (lbs): 114.6 lbs Total Body Water  (lbs): 83.6 lbs Visceral Fat Rating : 11   Other Clinical Data Fasting: no Labs: no Today's Visit #: 90 Starting Date: 09/28/16    Chief Complaint: OBESITY    History of Present Illness [name] is a 53 year old female with obesity and type 2 diabetes who presents for obesity treatment and progress assessment.   She is following a dietary plan with a caloric intake of 1500 to 1600 calories and a protein intake of 90 or more grams per day, but she meets these criteria only about 60% of the time. She struggles with protein intake and often skips meals. She has gained three pounds in the last month since her last visit.  She is not consistently getting seven to nine hours of sleep per night and is not currently engaging in any exercise. She mentions cooking more since her mother moved in with her, which may have impacted her eating habits.  She manages type 2 diabetes and has been off NSAIDs due to her use of Eliquis . She has started using gummies containing CBN and CBD for pain management, particularly for her knees, hip, and shoulders, which also help her sleep better at night.  She has a history of vitamin D  deficiency and is being treated with ergocalciferol  Rx.  She experiences frequent urination, getting up six times at night. No consumption of chocolate or soft drinks.      PHYSICAL EXAM:  Blood pressure 130/85, pulse 81, temperature 98.2 F (36.8 C), height 5' 8 (1.727 m),  weight 218 lb (98.9 kg), SpO2 100%. Body mass index is 33.15 kg/m.  DIAGNOSTIC DATA REVIEWED:  BMET    Component Value Date/Time   NA 140 11/03/2023 0430   NA 143 04/19/2023 1134   K 4.2 11/03/2023 0430   CL 110 11/03/2023 0430   CO2 23 11/03/2023 0430   GLUCOSE 102 (H) 11/03/2023 0430   BUN 27 (H) 11/03/2023 0430   BUN 19 04/19/2023 1134   CREATININE 0.79 11/03/2023 0430   CALCIUM 8.9 11/03/2023 0430   GFRNONAA >60 11/03/2023 0430   GFRAA 125 11/11/2019 1049   Lab Results  Component Value Date   HGBA1C 5.5 09/08/2023   HGBA1C 6.1 09/22/2010   Lab Results  Component Value Date   INSULIN  19.3 04/19/2023   INSULIN  14.7 09/28/2016   Lab Results  Component Value Date   TSH 2.910 11/02/2023   CBC    Component Value Date/Time   WBC 10.2 11/03/2023 0430   RBC 4.35 11/03/2023 0430   HGB 12.2 11/03/2023 0430   HGB 12.1 12/09/2021 1442   HCT 38.3 11/03/2023 0430   HCT 37.1 12/09/2021 1442   PLT 261 11/03/2023 0430   PLT 276 12/09/2021 1442   MCV 88.0 11/03/2023 0430   MCV 87 12/09/2021 1442   MCH 28.0 11/03/2023 0430   MCHC 31.9 11/03/2023 0430   RDW 13.8 11/03/2023 0430   RDW 13.4 12/09/2021 1442   Iron Studies No results found  for: IRON, TIBC, FERRITIN, IRONPCTSAT Lipid Panel     Component Value Date/Time   CHOL 171 09/08/2023 0959   CHOL 186 04/19/2023 1134   TRIG 52.0 09/08/2023 0959   HDL 65.80 09/08/2023 0959   HDL 68 04/19/2023 1134   CHOLHDL 3 09/08/2023 0959   VLDL 10.4 09/08/2023 0959   LDLCALC 94 09/08/2023 0959   LDLCALC 103 (H) 04/19/2023 1134   Hepatic Function Panel     Component Value Date/Time   PROT 6.3 (L) 11/03/2023 0430   PROT 6.8 04/19/2023 1134   ALBUMIN 3.3 (L) 11/03/2023 0430   ALBUMIN 4.2 04/19/2023 1134   AST 24 11/03/2023 0430   ALT 29 11/03/2023 0430   ALKPHOS 77 11/03/2023 0430   BILITOT 0.5 11/03/2023 0430   BILITOT 0.3 04/19/2023 1134   BILIDIR 0.1 06/12/2013 1632   IBILI 0.2 06/11/2009 2309       Component Value Date/Time   TSH 2.910 11/02/2023 0321   TSH 2.67 09/08/2023 0959   Nutritional Lab Results  Component Value Date   VD25OH 41.54 09/08/2023   VD25OH 50.9 04/19/2023   VD25OH 64.8 12/20/2022     Assessment and Plan Assessment & Plan Obesity management and weight loss Obesity management is ongoing with a structured dietary plan of 1500-1600 calories and 90+ grams of protein. Compliance is approximately 60%, with challenges in meeting protein requirements and skipping meals. Recent weight gain of 3 pounds over the last month, possibly due to fluid shifts and muscle gain. Lack of formal exercise and inconsistent sleep patterns noted. Stress and family dynamics may impact dietary habits. - Continue structured dietary plan with emphasis on protein intake. - Encouraged adherence to meal schedule and sleep hygiene. - Advised on structured plan for the next four weeks before Thanksgiving to potentially lose weight. - Discussed potential fluid shifts and muscle gain as factors in weight changes.  Type 2 diabetes mellitus Type 2 diabetes is being managed alongside obesity treatment. Not anticipating a problem with A1c levels and no medication adjustments in this encounter. - Continue current diabetes management plan.  Vitamin D  deficiency Managed with prescription vitamin D . Due for lab work to assess current levels. - Ordered lab work to assess vitamin D  levels. - Continue prescription vitamin D  supplementation.    Sita was counseled on the importance of maintaining healthy lifestyle habits, including balanced nutrition, regular physical activity, and behavioral modifications, while taking antiobesity medication.  Patient verbalized understanding that medication is an adjunct to, not a replacement for, lifestyle changes and that the long-term success and weight maintenance depend on continued adherence to these strategies.   Shahla was informed of the importance of  frequent follow up visits to maximize her success with intensive lifestyle modifications for her obesity and obesity related health conditions as recommended by USPSTF and CMS guidelines   Louann Penton, MD

## 2024-01-31 LAB — SEDIMENTATION RATE: Sed Rate: 33 mm/h (ref 0–40)

## 2024-01-31 LAB — CMP14+EGFR
ALT: 16 IU/L (ref 0–32)
AST: 22 IU/L (ref 0–40)
Albumin: 4.1 g/dL (ref 3.8–4.9)
Alkaline Phosphatase: 120 IU/L (ref 49–135)
BUN/Creatinine Ratio: 16 (ref 9–23)
BUN: 14 mg/dL (ref 6–24)
Bilirubin Total: 0.3 mg/dL (ref 0.0–1.2)
CO2: 18 mmol/L — ABNORMAL LOW (ref 20–29)
Calcium: 9.1 mg/dL (ref 8.7–10.2)
Chloride: 107 mmol/L — ABNORMAL HIGH (ref 96–106)
Creatinine, Ser: 0.9 mg/dL (ref 0.57–1.00)
Globulin, Total: 2.7 g/dL (ref 1.5–4.5)
Glucose: 77 mg/dL (ref 70–99)
Potassium: 4.4 mmol/L (ref 3.5–5.2)
Sodium: 139 mmol/L (ref 134–144)
Total Protein: 6.8 g/dL (ref 6.0–8.5)
eGFR: 76 mL/min/1.73 (ref >59)

## 2024-01-31 LAB — CBC WITH DIFFERENTIAL/PLATELET
Basophils Absolute: 0.1 x10E3/uL (ref 0.0–0.2)
Basos: 1 %
EOS (ABSOLUTE): 0.2 x10E3/uL (ref 0.0–0.4)
Eos: 3 %
Hematocrit: 39.6 % (ref 34.0–46.6)
Hemoglobin: 12.9 g/dL (ref 11.1–15.9)
Immature Grans (Abs): 0 x10E3/uL (ref 0.0–0.1)
Immature Granulocytes: 0 %
Lymphocytes Absolute: 2.2 x10E3/uL (ref 0.7–3.1)
Lymphs: 33 %
MCH: 28.2 pg (ref 26.6–33.0)
MCHC: 32.6 g/dL (ref 31.5–35.7)
MCV: 87 fL (ref 79–97)
Monocytes Absolute: 0.6 x10E3/uL (ref 0.1–0.9)
Monocytes: 8 %
Neutrophils Absolute: 3.7 x10E3/uL (ref 1.4–7.0)
Neutrophils: 55 %
Platelets: 294 x10E3/uL (ref 150–450)
RBC: 4.58 x10E6/uL (ref 3.77–5.28)
RDW: 13.8 % (ref 11.7–15.4)
WBC: 6.7 x10E3/uL (ref 3.4–10.8)

## 2024-01-31 LAB — FOLATE: Folate: 10.2 ng/mL (ref >3.0)

## 2024-01-31 LAB — LIPID PANEL WITH LDL/HDL RATIO
Cholesterol, Total: 184 mg/dL (ref 100–199)
HDL: 50 mg/dL (ref >39)
LDL Chol Calc (NIH): 117 mg/dL — ABNORMAL HIGH (ref 0–99)
LDL/HDL Ratio: 2.3 ratio (ref 0.0–3.2)
Triglycerides: 91 mg/dL (ref 0–149)
VLDL Cholesterol Cal: 17 mg/dL (ref 5–40)

## 2024-01-31 LAB — VITAMIN B12: Vitamin B-12: 420 pg/mL (ref 232–1245)

## 2024-01-31 LAB — INSULIN, RANDOM: INSULIN: 13.1 u[IU]/mL (ref 2.6–24.9)

## 2024-01-31 LAB — TSH: TSH: 2.51 u[IU]/mL (ref 0.450–4.500)

## 2024-01-31 LAB — VITAMIN D 25 HYDROXY (VIT D DEFICIENCY, FRACTURES): Vit D, 25-Hydroxy: 53 ng/mL (ref 30.0–100.0)

## 2024-01-31 LAB — HEMOGLOBIN A1C
Est. average glucose Bld gHb Est-mCnc: 105 mg/dL
Hgb A1c MFr Bld: 5.3 % (ref 4.8–5.6)

## 2024-02-02 ENCOUNTER — Other Ambulatory Visit: Payer: Self-pay | Admitting: Medical Genetics

## 2024-02-02 DIAGNOSIS — Z7901 Long term (current) use of anticoagulants: Secondary | ICD-10-CM | POA: Diagnosis not present

## 2024-02-02 DIAGNOSIS — M5459 Other low back pain: Secondary | ICD-10-CM | POA: Diagnosis not present

## 2024-02-02 DIAGNOSIS — M25511 Pain in right shoulder: Secondary | ICD-10-CM | POA: Diagnosis not present

## 2024-02-02 DIAGNOSIS — Z006 Encounter for examination for normal comparison and control in clinical research program: Secondary | ICD-10-CM

## 2024-02-02 DIAGNOSIS — M25512 Pain in left shoulder: Secondary | ICD-10-CM | POA: Diagnosis not present

## 2024-02-02 DIAGNOSIS — M7062 Trochanteric bursitis, left hip: Secondary | ICD-10-CM | POA: Diagnosis not present

## 2024-02-06 ENCOUNTER — Ambulatory Visit: Admitting: Physician Assistant

## 2024-02-06 ENCOUNTER — Encounter: Payer: Self-pay | Admitting: Physician Assistant

## 2024-02-06 ENCOUNTER — Other Ambulatory Visit: Payer: Self-pay

## 2024-02-06 VITALS — BP 118/72 | HR 80 | Ht 68.0 in | Wt 219.0 lb

## 2024-02-06 DIAGNOSIS — K581 Irritable bowel syndrome with constipation: Secondary | ICD-10-CM | POA: Diagnosis not present

## 2024-02-06 DIAGNOSIS — K219 Gastro-esophageal reflux disease without esophagitis: Secondary | ICD-10-CM

## 2024-02-06 MED ORDER — FAMOTIDINE 20 MG PO TABS: ORAL_TABLET | ORAL | 3 refills | Status: AC

## 2024-02-06 MED ORDER — LUBIPROSTONE 24 MCG PO CAPS: 24.0000 ug | ORAL_CAPSULE | Freq: Two times a day (BID) | ORAL | 3 refills | Status: AC

## 2024-02-06 NOTE — Progress Notes (Signed)
 Chief Complaint: Follow-up GERD and constipation  HPI:    [name] is a 53 year old female with a past medical history as listed below including IBS-C, assigned to Dr. Shila last visit, who returns to clinic today for follow-up of her reflux and constipation.    11/12/2012 patient seen in clinic for IBS.  At that time diarrhea predominant.  She was continued on Glycopyrrolate  2 mg twice a day.  Continued on Meprazole 40 for reflux.    07/15/2019 colonoscopy was normal.  Repeat recommended 10 years.    09/08/2023 labs including CBC, CMP, hemoglobin A1c, B12, vitamin D , magnesium  and TSH all normal.    09/27/2023 patient seen in clinic for IBS-C and epigastric burning.  At that time discussed that since being on Mounjaro  it had messed up her IBS.  Severely constipated regardless of Linzess  145 mcg daily.  Also discussed Famotidine  20 mg at night still with epigastric burning and pain.  At that point discussed that Mounjaro  could cause symptoms.  She would prefer to stay on it.  Trialed Amitiza  24 mcg twice a day with food.  Refilled Famotidine  for 20 mg twice daily.    Today, patient reports doing well on her Amitiza  24 mcg twice daily and Famotidine  20 mg twice daily.  Occasionally she has to use a Phazyme as well but blames this on a lot of fried food she has been eating after her father passed away and everybody brought food to her house.  In general does well with occasional flares of IBS which she tells me is usually diarrhea for 24 hours, but this does not happen often.  Has been under a lot of stress.  Her 2 year old mother just moved in with her and her husband.  Also requiring 2 shoulder repairs.    Denies fever, chills or weight loss.  Past Medical History:  Diagnosis Date   Anemia    Anxiety    Arthritis    inflammitory   Asthma    B12 deficiency    Back pain    Chest pain    a. 09/2011 ETT: Ex time 4:13, max HR 152. No ST/T changes-->low risk; b. 06/2021 Cor CTA: Ca2+ = 0. Nl cors.    Chronic joint pain    Constipation    DDD (degenerative disc disease), lumbar    low back and neck and shoulders   Depression    during divorce & legal matters   Diastolic dysfunction    a. 09/2011 Echo: EF 60-65%, no rwma; b. 06/2021 Echo: EF 55-60%, no rwma, GrI DD, nl RV fxn.   Fatty liver    Fibromyalgia    Gallbladder problem    GERD (gastroesophageal reflux disease)    History of kidney stones    Dr rosalynn Likens   History of polycystic ovarian disease S/P BSO   Hx of anxiety disorder    Hypertension    a   Hypothyroidism    no meds   IBS (irritable bowel syndrome)    Infertility, female    Joint pain    Lactose intolerance    Lupus hx positive ANA   followed by Dr Jon Jacob; possible lupus   Multiple food allergies    Myalgia    Osteoarthritis    Polyarthritis, 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HCC)    POLYCYSTIC OVARIAN DISEASE 03/02/2007   Qualifier: Diagnosis of  By: Tish MD, Elsie POUR BSO for cysts & endometriosis; Dr Thyra, Gyn Seeing Dr Nathanel Bunker  PONV (postoperative nausea and vomiting)    Pre-diabetes    Sweating profusely    Symptomatic mammary hypertrophy    URI (upper respiratory infection)    currently on cefdinir     Past Surgical History:  Procedure Laterality Date   ABDOMINAL HYSTERECTOMY     ANKLE SURGERY     X3   APPENDECTOMY  1991   exploratory lap   BACK SURGERY     BREAST REDUCTION SURGERY Bilateral 03/26/2015   Procedure: BILATERAL BREAST REDUCTION ;  Surgeon: Estefana GORMAN Fritter, DO;  Location: [facility];  Service: Plastics;  Laterality: Bilateral;   CARPAL TUNNEL RELEASE     bilateral; Dr Alix   COLONOSCOPY     in 1990s   CYSTO/ RIGHT RETROGRADE URETERAL PYELOGRAM  07/09/2004   HX BILATERAL RENAL STONES/ RIGHT FLANK PAIN   CYSTOSCOPY W/ URETERAL STENT PLACEMENT  11/12/2011   Procedure: CYSTOSCOPY WITH RETROGRADE PYELOGRAM/URETERAL STENT PLACEMENT;  Surgeon: Ricardo Likens, MD;  Location: [facility];  Service: Urology;   Laterality: Right;   CYSTOSCOPY W/ URETERAL STENT REMOVAL  12/07/2011   Procedure: CYSTOSCOPY WITH STENT REMOVAL;  Surgeon: Ricardo Likens, MD;  Location[facility];  Service: Urology;  Laterality: Right;   CYSTOSCOPY/RETROGRADE/URETEROSCOPY/STONE EXTRACTION WITH BASKET  12/07/2011   Procedure: CYSTOSCOPY/RETROGRADE/URETEROSCOPY/STONE EXTRACTION WITH BASKET;  Surgeon: Ricardo Likens, MD;  Location: [facility];  Service: Urology;  Laterality: Right;   DILATION AND CURETTAGE OF UTERUS     X5   EXCISION LEFT BARTHOLIN GLAND  02/05/2002   EYE SURGERY     Retinal tears surgery bilateral   LAPAROSCOPIC ASSISTED VAGINAL HYSTERECTOMY  2000   LAPAROSCOPIC CHOLECYSTECTOMY  05/31/2007   LAPAROSCOPIC LASER ABLATION ENDOMETRIOSIS AND LEFT SALPINGO-OOPHORECTOMY  11/03/1999   LAPAROSCOPY WITH RIGHT SALPINGO-OOPHECTOMY/ LYSIS ADHESIONS AND ABLATION ENDOMETRIOSIS  05/17/2001   LIPOSUCTION Bilateral 03/26/2015   Procedure: WITH LIPOSUCTION;  Surgeon: Estefana GORMAN Fritter, DO;  Location: [facility];  Service: Plastics;  Laterality: Bilateral;   LUMBAR LAMINECTOMY  2003   L4 - L5   NECK SURGERY  2017   PLANTAR FASCIA SURGERY     right   PLANTAR FASCIA SURGERY  02/2011   left   REDUCTION MAMMAPLASTY  2016   RIGHT URETEROSCOPIC STONE EXTRACTION  08/04/2000   SHOULDER ARTHROSCOPY WITH ROTATOR CUFF REPAIR Right 05/11/2020   Procedure: SHOULDER ARTHROSCOPY WITH ROTATOR CUFF REPAIR;  Surgeon: Leora Lynwood SAUNDERS, MD;  Location: [facility];  Service: Orthopedics;  Laterality: Right;   TONSILLECTOMY     TOTAL HIP ARTHROPLASTY Left 01/11/2022   Procedure: TOTAL HIP ARTHROPLASTY ANTERIOR APPROACH;  Surgeon: Leora Lynwood SAUNDERS, MD;  Location: [facility];  Service: Orthopedics;  Laterality: Left;   URETERAL STENT PLACEMENT  11/12/2011   Dr Likens    Current Outpatient Medications  Medication Sig Dispense Refill   ALPRAZolam  (XANAX ) 0.25 MG tablet Take 1 tablet (0.25 mg total)  by mouth daily as needed for anxiety. 30 tablet 1   apixaban  (ELIQUIS ) 5 MG TABS tablet Take 1 tablet (5 mg total) by mouth 2 (two) times daily. 180 tablet 2   cefdinir  (OMNICEF ) 300 MG capsule Take 1 capsule (300 mg total) by mouth 2 (two) times daily. 20 capsule 0   citalopram  (CELEXA ) 40 MG tablet TAKE 1 TABLET BY MOUTH EVERY DAY 90 tablet 1   cyclobenzaprine  (FLEXERIL ) 10 MG tablet Take 10 mg by mouth at bedtime.     diphenhydramine -acetaminophen  (TYLENOL  PM) 25-500 MG TABS tablet Take 2 tablets by  mouth at bedtime as needed. Home med.     famotidine  (PEPCID ) 20 MG tablet TAKE 1 TABLET BY MOUTH EVERYDAY AT BEDTIME 90 tablet 1   lubiprostone  (AMITIZA ) 24 MCG capsule Take 1 capsule (24 mcg total) by mouth 2 (two) times daily with a meal. 60 capsule 3   metoprolol  succinate (TOPROL -XL) 50 MG 24 hr tablet Take 1.5 tablets (75 mg total) by mouth 2 (two) times daily. Take with or immediately following a meal. 270 tablet 3   SKYRIZI PEN 150 MG/ML SOAJ Inject into the skin.     tirzepatide  (MOUNJARO ) 15 MG/0.5ML Pen Inject 15 mg into the skin once a week. 6 mL 0   topiramate  (TOPAMAX ) 50 MG tablet Take 1 tablet (50 mg total) by mouth daily. 30 tablet 0   Vitamin D , Ergocalciferol , (DRISDOL ) 1.25 MG (50000 UNIT) CAPS capsule Take 1 capsule (50,000 Units total) by mouth every 7 (seven) days. 13 capsule 0   No current facility-administered medications for this visit.    Allergies as of 02/06/2024 - Review Complete 01/30/2024  Allergen Reaction Noted   Chlorhexidine  gluconate Rash, Itching, and Swelling 04/17/2015   Ivp dye [iodinated contrast media] Rash and Other (See Comments) 06/13/2011   Nalbuphine Shortness Of Breath and Rash    Septra [sulfamethoxazole-trimethoprim] Shortness Of Breath and Rash 01/23/2013   Silicone Dermatitis 04/30/2019   Sulfamethoxazole-trimethoprim Rash, Dermatitis, and Shortness Of Breath 01/23/2013   Adhesive [tape] Rash 04/30/2019   Cymbalta  [duloxetine  hcl]   10/29/2021   Diclofenac Other (See Comments) 01/24/2019   Humira [adalimumab]  02/16/2021   Methotrexate and trimetrexate  02/16/2021   Wound dressing adhesive Swelling 04/30/2019   Apremilast Rash and Dermatitis 12/30/2021   Augmentin  [amoxicillin -pot clavulanate] Nausea And Vomiting 01/22/2018   Betadine [povidone iodine] Rash 04/21/2020   Latex Rash 04/21/2020    Family History  Problem Relation Age of Onset   Hypertension Mother    Colon polyps Mother    Atrial fibrillation Mother        Dr Verlin   Hyperlipidemia Mother    Heart disease Mother    Anxiety disorder Mother    Sleep apnea Mother    Obesity Mother    Heart attack Father 73   Diabetes Father    Hypertension Father    Hyperlipidemia Father    Heart disease Father    Stroke Father    Obesity Father    Hypertension Sister    Colon cancer Maternal Uncle    Breast cancer Paternal Aunt 14   Colon cancer Paternal Uncle    Colon polyps Maternal Grandmother    Stroke Maternal Grandmother 76   Stroke Maternal Grandfather 37   Heart attack Paternal Grandmother 17   Diabetes Paternal Grandfather     Social History   Socioeconomic History   Marital status: Married    Spouse name: Caron   Number of children: 0   Years of education: college   Highest education level: Some college, no degree  Occupational History   Occupation: disabled   Occupation: disabled  Tobacco Use   Smoking status: Never    Passive exposure: Never   Smokeless tobacco: Never  Vaping Use   Vaping status: Never Used  Substance and Sexual Activity   Alcohol use: Yes    Comment: rarely   Drug use: No   Sexual activity: Yes    Birth control/protection: Surgical  Other Topics Concern   Not on file  Social History Narrative   Patient Lives at home  with her husband Sandor)   Disabled.   Education two years of college.   Right handed.   Caffeine coffee and sweet tea. Not daily.   Social Drivers of Corporate Investment Banker  Strain: Low Risk  (06/30/2023)   Overall Financial Resource Strain (CARDIA)    Difficulty of Paying Living Expenses: Not hard at all  Food Insecurity: No Food Insecurity (11/02/2023)   Hunger Vital Sign    Worried About Running Out of Food in the Last Year: Never true    Ran Out of Food in the Last Year: Never true  Transportation Needs: No Transportation Needs (11/02/2023)   PRAPARE - Administrator, Civil Service (Medical): No    Lack of Transportation (Non-Medical): No  Physical Activity: Insufficiently Active (06/30/2023)   Exercise Vital Sign    Days of Exercise per Week: 5 days    Minutes of Exercise per Session: 10 min  Stress: Stress Concern Present (06/30/2023)   [employer] of Occupational Health - Occupational Stress Questionnaire    Feeling of Stress : Rather much  Social Connections: Socially Integrated (11/02/2023)   Social Connection and Isolation Panel    Frequency of Communication with Friends and Family: More than three times a week    Frequency of Social Gatherings with Friends and Family: More than three times a week    Attends Religious Services: More than 4 times per year    Active Member of [employer] or Organizations: Yes    Attends Engineer, Structural: More than 4 times per year    Marital Status: Married  Catering Manager Violence: Not At Risk (11/02/2023)   Humiliation, Afraid, Rape, and Kick questionnaire    Fear of Current or Ex-Partner: No    Emotionally Abused: No    Physically Abused: No    Sexually Abused: No    Review of Systems:    Constitutional: No weight loss, fever or chills Skin: No rash  Cardiovascular: No chest pain Respiratory: No SOB  Gastrointestinal: See HPI and otherwise negative Genitourinary: No dysuria Neurological: No headache, dizziness or syncope Musculoskeletal: No new muscle or joint pain Hematologic: No bleeding  Psychiatric: No history of depression or anxiety   Physical Exam:  Vital signs: BP  118/72   Pulse 80   Ht 5' 8 (1.727 m)   Wt 219 lb (99.3 kg)   SpO2 98%   BMI 33.30 kg/m    Constitutional:   Pleasant overweight Caucasian female appears to be in NAD, Well developed, Well nourished, alert and cooperative Respiratory: Respirations even and unlabored. Lungs clear to auscultation bilaterally.   No wheezes, crackles, or rhonchi.  Cardiovascular: Normal S1, S2. No MRG. Regular rate and rhythm. No peripheral edema, cyanosis or pallor.  Gastrointestinal:  Soft, nondistended, nontender. No rebound or guarding. Normal bowel sounds. No appreciable masses or hepatomegaly. Rectal:  Not performed.  Psychiatric: Demonstrates good judgement and reason without abnormal affect or behaviors.  RELEVANT LABS AND IMAGING: CBC    Component Value Date/Time   WBC 6.7 01/30/2024 1036   WBC 10.2 11/03/2023 0430   RBC 4.58 01/30/2024 1036   RBC 4.35 11/03/2023 0430   HGB 12.9 01/30/2024 1036   HCT 39.6 01/30/2024 1036   PLT 294 01/30/2024 1036   MCV 87 01/30/2024 1036   MCH 28.2 01/30/2024 1036   MCH 28.0 11/03/2023 0430   MCHC 32.6 01/30/2024 1036   MCHC 31.9 11/03/2023 0430   RDW 13.8 01/30/2024 1036   LYMPHSABS 2.2  01/30/2024 1036   MONOABS 0.6 09/08/2023 0959   EOSABS 0.2 01/30/2024 1036   BASOSABS 0.1 01/30/2024 1036    CMP     Component Value Date/Time   NA 139 01/30/2024 1036   K 4.4 01/30/2024 1036   CL 107 (H) 01/30/2024 1036   CO2 18 (L) 01/30/2024 1036   GLUCOSE 77 01/30/2024 1036   GLUCOSE 102 (H) 11/03/2023 0430   BUN 14 01/30/2024 1036   CREATININE 0.90 01/30/2024 1036   CALCIUM 9.1 01/30/2024 1036   PROT 6.8 01/30/2024 1036   ALBUMIN 4.1 01/30/2024 1036   AST 22 01/30/2024 1036   ALT 16 01/30/2024 1036   ALKPHOS 120 01/30/2024 1036   BILITOT 0.3 01/30/2024 1036   GFRNONAA >60 11/03/2023 0430   GFRAA 125 11/11/2019 1049    Assessment: 1.  IBS-C: Diagnosed the age of 73, up-to-date on colonoscopy last normal in 2021 with repeat recommended in 10  years, at last office visit given a trial Amitiza  24 mcg twice daily given that Linzess  290 was not working after she has started Mounjaro , doing well 2.  Epigastric pain: Thought related to GERD/gastritis possibly from partial gastroparesis from Mounjaro , increase Famotidine  to 20 mg twice daily which works fairly well for her with the occasional addition of Phazyme  Plan: 1.  Continue Famotidine  20 twice daily #180 with 3 refills 2.  Continue Amitiza  24 mcg twice daily #190 with 3 refills 3.  Patient to follow in clinic with us  in a year for further refills and follow-up.  Delon Failing, PA-C  Gastroenterology 02/06/2024, 8:25 AM  Cc: Geofm Glade PARAS, MD

## 2024-02-12 DIAGNOSIS — M5416 Radiculopathy, lumbar region: Secondary | ICD-10-CM | POA: Diagnosis not present

## 2024-02-13 ENCOUNTER — Telehealth (HOSPITAL_BASED_OUTPATIENT_CLINIC_OR_DEPARTMENT_OTHER): Payer: Self-pay

## 2024-02-13 NOTE — Telephone Encounter (Signed)
 Pharmacy please advise on holding Eliquis  prior to lumbar epidural steroid injection scheduled for TBD. Thank you.

## 2024-02-13 NOTE — Telephone Encounter (Signed)
   Pre-operative Risk Assessment    Patient Name: [name]  DOB: 1970-10-28 MRN: [id number]   Date of last office visit: 01/03/24 with Dr. Cindie Date of next office visit: 04/09/23 with Riddle   Request for Surgical Clearance    Procedure:  Lumbar Epidural Steroid Injection  Date of Surgery:  Clearance TBD                                 Surgeon:  Not indicated Surgeon's Group or Practice Name:  [facility] Phone number:  [phone] Fax number:  [phone]   Type of Clearance Requested:   - Medical  - Pharmacy:  Hold Apixaban  (Eliquis ) for 3 days   Type of Anesthesia:  Not Indicated   Additional requests/questions:    Bonney Augustin JONETTA Delores   02/13/2024, 1:49 PM \

## 2024-02-13 NOTE — Telephone Encounter (Signed)
 Dr. Cindie, you recently saw this pt in clinic. Are you able to comment on surgical clearance for upcoming Lumbar Epidural Steroid Injection  scheduled for TBD? Please route your response to P CV DIV PREOP. Thank you!

## 2024-02-15 NOTE — Telephone Encounter (Signed)
 Patient with diagnosis of atrial fibrillation on Eliquis  for anticoagulation.    Procedure:  Lumbar Epidural Steroid Injection   Date of Surgery:  Clearance TBD  CHA2DS2-VASc Score = 2   This indicates a 2.2% annual risk of stroke. The patient's score is based upon: CHF History: 0 HTN History: 1 Diabetes History: 0 Stroke History: 0 Vascular Disease History: 0 Age Score: 0 Gender Score: 1       CrCl 88 ml/min Platelet count 294 K  Patient has/has not had an Afib/aflutter ablation in the last 3 months, DCCV within the last 4 weeks or a watchman implanted in the last 45 days   Per office protocol, patient can hold Eliquis  for 3 days prior to procedure.   Patient will not need bridging with Lovenox  (enoxaparin ) around procedure.  **This guidance is not considered finalized until pre-operative APP has relayed final recommendations.**

## 2024-02-21 NOTE — Telephone Encounter (Signed)
   Patient Name: [name]  DOB: 1970/07/08 MRN: [id number]  Primary Cardiologist: Lonni Cash, MD  Chart reviewed as part of pre-operative protocol coverage. Given past medical history and time since last visit, based on ACC/AHA guidelines, [name] is at acceptable risk for the planned procedure without further cardiovascular testing.   Per Dr. Cindie 02/21/2024 OK to proceed with injection at acceptable risk. Thanks, Ole Cindie, MD, [facility], [facility] Cardiac Electrophysiology  Per Pharmacy: 02/15/2024 Per office protocol, patient can hold Eliquis  for 3 days prior to procedure.   Patient will not need bridging with Lovenox  (enoxaparin ) around procedure.  The patient was advised that if she develops new symptoms prior to surgery to contact our office to arrange for a follow-up visit, and she verbalized understanding.  I will route this recommendation to the requesting party via Epic fax function and remove from pre-op pool.  Please call with questions.  Lamarr Satterfield, NP 02/21/2024, 8:36 AM

## 2024-02-27 DIAGNOSIS — N390 Urinary tract infection, site not specified: Secondary | ICD-10-CM | POA: Diagnosis not present

## 2024-02-27 DIAGNOSIS — N200 Calculus of kidney: Secondary | ICD-10-CM | POA: Diagnosis not present

## 2024-02-28 DIAGNOSIS — L4050 Arthropathic psoriasis, unspecified: Secondary | ICD-10-CM | POA: Diagnosis not present

## 2024-02-28 DIAGNOSIS — Z7960 Long term (current) use of unspecified immunomodulators and immunosuppressants: Secondary | ICD-10-CM | POA: Diagnosis not present

## 2024-02-28 DIAGNOSIS — M791 Myalgia, unspecified site: Secondary | ICD-10-CM | POA: Diagnosis not present

## 2024-02-28 DIAGNOSIS — L409 Psoriasis, unspecified: Secondary | ICD-10-CM | POA: Diagnosis not present

## 2024-02-28 DIAGNOSIS — M150 Primary generalized (osteo)arthritis: Secondary | ICD-10-CM | POA: Diagnosis not present

## 2024-03-05 ENCOUNTER — Encounter: Payer: Self-pay | Admitting: Cardiology

## 2024-03-07 ENCOUNTER — Other Ambulatory Visit: Payer: Self-pay | Admitting: Urology

## 2024-03-07 ENCOUNTER — Ambulatory Visit (INDEPENDENT_AMBULATORY_CARE_PROVIDER_SITE_OTHER): Payer: Self-pay | Admitting: Family Medicine

## 2024-03-07 ENCOUNTER — Telehealth: Payer: Self-pay | Admitting: Cardiovascular Disease

## 2024-03-07 DIAGNOSIS — N132 Hydronephrosis with renal and ureteral calculous obstruction: Secondary | ICD-10-CM | POA: Diagnosis not present

## 2024-03-07 DIAGNOSIS — N200 Calculus of kidney: Secondary | ICD-10-CM | POA: Diagnosis not present

## 2024-03-07 NOTE — Telephone Encounter (Signed)
 Patient was previously cleared on 02/21/2024 for the procedure. Will remove from Pre-Op Pool.

## 2024-03-07 NOTE — Telephone Encounter (Signed)
 Pharmacy please advise on holding Eliquis  prior to ESWL scheduled for 03/11/2024. Last labs 110/28/2025.Thank you.

## 2024-03-07 NOTE — Telephone Encounter (Signed)
   Name: [name]  DOB: Nov 18, 1970  MRN: [id number]  Primary Cardiologist: Lonni Cash, MD   Preoperative team, please contact this patient and set up a phone call appointment ASAP for further preoperative risk assessment. Please obtain consent and complete medication review. Thank you for your help.  I confirm that guidance regarding antiplatelet and oral anticoagulation therapy has been completed and, if necessary, noted below.  I have requested pharmacy recommendations on Eliquis    I also confirmed the patient resides in the state of [place] . As per [facility] Medical Board telemedicine laws, the patient must reside in the state in which the provider is licensed.   Lamarr Satterfield, NP 03/07/2024, 10:55 AM [facility]

## 2024-03-07 NOTE — Telephone Encounter (Signed)
 Patient with diagnosis of afib on Eliquis  for anticoagulation.    Procedure: ESWL  Date of procedure: 03/11/24   CHA2DS2-VASc Score = 3   This indicates a 3.2% annual risk of stroke. The patient's score is based upon: CHF History: 0 HTN History: 1 Diabetes History: 1 Stroke History: 0 Vascular Disease History: 0 Age Score: 0 Gender Score: 1      CrCl 88 ml/min Platelet count 294  Patient has not had an Afib/aflutter ablation in the last 3 months, DCCV within the last 4 weeks or a watchman implanted in the last 45 days   Per office protocol, patient can hold Eliquis  for 2 days prior to procedure.    **This guidance is not considered finalized until pre-operative APP has relayed final recommendations.**

## 2024-03-07 NOTE — Telephone Encounter (Signed)
   Pre-operative Risk Assessment    Patient Name: [name]  DOB: 1970-07-04 MRN: [id number]   Date of last office visit: 11/09/23 Date of next office visit: 04/08/24   Request for Surgical Clearance    Procedure:  ESWL   Date of Surgery:  Clearance 03/11/24                                Surgeon:  Dr. Steffan Pea Surgeon's Group or Practice Name:  [facility] Phone number:  [phone]  Fax number:  [phone]   Type of Clearance Requested:   - Medical  - Pharmacy:  Hold Apixaban  (Eliquis )     Type of Anesthesia:  Local    Additional requests/questions:     Signed, Jasmin B Wilson   03/07/2024, 10:39 AM

## 2024-03-08 ENCOUNTER — Encounter (HOSPITAL_COMMUNITY): Payer: Self-pay | Admitting: Urology

## 2024-03-08 NOTE — Progress Notes (Signed)
 [facility]O PREOP PHONE CALL   ALLERGIES REVIEWED: YES  MEDICATION REVIEW DONE: YES MEDICATIONS THAT PT SHOULD HOLD (LIST): Eliquis  hold 2 days last dose 12/5, Mounjaro  hold 1 week last dose 11/30, NSAID hold 48hr  CAN PT WALK UP STAIRS WITHOUT SHORTNESS OF BREATH: YES HOME O2: NO CPAP: NO  IF YES, INFORMED PT TO BRING CPAP WITH TUBING AND MASK:YES/NO   INFORMED DRIVER NEEDED FOR PROCEDURE: YES   PT WAS GIVEN BLUE FOLDER AT UROLOGY APPT: YES PT INFORMED TO BRING BLUE FOLDER WITH ALL CONTENTS: YES  REVIEWED ARRIVAL TIME AND LOCATION: YES  OTHER PERTINENT INFORMATION:

## 2024-03-10 NOTE — H&P (Signed)
 H&P  Chief Complaint:   History of Present Illness:  1- Surgical Nephrolithiasis- pt with many prior episodes of stones, some sponteanously passed, others treated with SWL. Prior composition Ca-Ox.  Recent Stone History: 12/07/11 - ureteroscopic stone manipulation for CaOx stone  Recent Surveillance 02/2017 - CT - bilateral scattered punctate stones (each <30mm) 11/2018 - CT - bilateral scattered punctate stones (each <2mm), no hydronephrosis 06/2022 - KUB, Renal US  - Rt 5-73mm stone x 4, Lt 7-44mm stone x 2, ny hydro 06/2023 - KUB, renal US  - Stabel bilateral pap tip calcs, no hydro.  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 No pads. Prior UDS unremarkablewtih very mild instability, no SUI. Had a good repsone to [employer] for awhile, but had significant dry mouth. She has now tried oxytrol  which has less dry mouth but not quite as effective overall  4 - Occasional Cystitis - occasional bacterial cystitis, symptoms irritative voidign that improve on therapy. Most recetn CX 2014 e. coli res amp but sens all others  5 - Rule Out Renal Insufficiency - Cr transietly 1.2 2022, f/u <1. She is overweight diabetic.  PMH sig for hyst, ex-lap, chole, lupus, fibromualgia, anxiety/depression, DM2, left total hip, on disability  Today [name] is seen for KUB, renal US .No interval colic. Dad in / out of hospital, big stressor. CHF, DM.  02/27/2024: Came presents today for concerns of a kidney stone versus UTI. She has had right sided flank pain for thelast 2 days with accompanied urinary frequency, dysuria. She denies gross hematuria, fevers, nausea. She has known bilateral renal stones  03/07/24: Patient here today for further evaluation. Her urine  culture from last visit was positive for E. coli, she started on Cipro . However her symptoms were more consistent with a stone event so she was instructed to return today for CT. Ireviewed the KUB from last week, there was a right sided pelvic opacity that was present and consistent on KUB inMarch but was not present on previous imaging. CT today showed hydronephrosis and confirming that opacity to be a5 mm right ureteral stone. She is on Eliquis  and unable to take NSAIDs. She has had nausea, previously well managed with promethazine . Denies fevers, chills.  03/11/24: [plan for ESWL today     Past Medical History:  Diagnosis Date   Anemia    Anxiety    Arthritis    inflammitory   Asthma    B12 deficiency    Back pain    Chest pain    a. 09/2011 ETT: Ex time 4:13, max HR 152. No ST/T changes-->low risk; b. 06/2021 Cor CTA: Ca2+ = 0. Nl cors.   Chronic joint pain    Constipation    DDD (degenerative disc disease), lumbar    low back and neck and shoulders   Depression    during divorce & legal matters   Diastolic dysfunction    a. 09/2011 Echo: EF 60-65%, no rwma; b. 06/2021 Echo: EF 55-60%, no rwma, GrI DD, nl RV fxn.   Fatty liver    Fibromyalgia    Gallbladder problem    GERD (gastroesophageal reflux disease)    History of kidney stones    Dr rosalynn Likens   History of polycystic ovarian disease S/P BSO  Hx of anxiety disorder    Hypertension    a   Hypothyroidism    no meds   IBS (irritable bowel syndrome)    Infertility, female    Joint pain    Lactose intolerance    Lupus hx positive ANA   followed by Dr Jon Jacob; possible lupus   Multiple food allergies    Myalgia    Osteoarthritis    Polyarthritis, 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HCC)    POLYCYSTIC OVARIAN DISEASE 03/02/2007   Qualifier: Diagnosis of  By: Tish MD, Elsie POUR BSO for cysts & endometriosis; Dr Thyra, Gyn Seeing Dr Nathanel Bunker     PONV (postoperative nausea and vomiting)    Pre-diabetes    Sweating  profusely    Symptomatic mammary hypertrophy    URI (upper respiratory infection)    currently on cefdinir    Past Surgical History:  Procedure Laterality Date   ABDOMINAL HYSTERECTOMY     ANKLE SURGERY     X3   APPENDECTOMY  1991   exploratory lap   BACK SURGERY     BREAST REDUCTION SURGERY Bilateral 03/26/2015   Procedure: BILATERAL BREAST REDUCTION ;  Surgeon: Estefana GORMAN Fritter, DO;  Location: [facility];  Service: Plastics;  Laterality: Bilateral;   CARPAL TUNNEL RELEASE     bilateral; Dr Alix   COLONOSCOPY     in 1990s   CYSTO/ RIGHT RETROGRADE URETERAL PYELOGRAM  07/09/2004   HX BILATERAL RENAL STONES/ RIGHT FLANK PAIN   CYSTOSCOPY W/ URETERAL STENT PLACEMENT  11/12/2011   Procedure: CYSTOSCOPY WITH RETROGRADE PYELOGRAM/URETERAL STENT PLACEMENT;  Surgeon: Ricardo Likens, MD;  Location: [facility];  Service: Urology;  Laterality: Right;   CYSTOSCOPY W/ URETERAL STENT REMOVAL  12/07/2011   Procedure: CYSTOSCOPY WITH STENT REMOVAL;  Surgeon: Ricardo Likens, MD;  Location[facility];  Service: Urology;  Laterality: Right;   CYSTOSCOPY/RETROGRADE/URETEROSCOPY/STONE EXTRACTION WITH BASKET  12/07/2011   Procedure: CYSTOSCOPY/RETROGRADE/URETEROSCOPY/STONE EXTRACTION WITH BASKET;  Surgeon: Ricardo Likens, MD;  Location: [facility];  Service: Urology;  Laterality: Right;   DILATION AND CURETTAGE OF UTERUS     X5   EXCISION LEFT BARTHOLIN GLAND  02/05/2002   EYE SURGERY     Retinal tears surgery bilateral   LAPAROSCOPIC ASSISTED VAGINAL HYSTERECTOMY  2000   LAPAROSCOPIC CHOLECYSTECTOMY  05/31/2007   LAPAROSCOPIC LASER ABLATION ENDOMETRIOSIS AND LEFT SALPINGO-OOPHORECTOMY  11/03/1999   LAPAROSCOPY WITH RIGHT SALPINGO-OOPHECTOMY/ LYSIS ADHESIONS AND ABLATION ENDOMETRIOSIS  05/17/2001   LIPOSUCTION Bilateral 03/26/2015   Procedure: WITH LIPOSUCTION;  Surgeon: Estefana GORMAN Fritter, DO;  Location: [facility];  Service:  Plastics;  Laterality: Bilateral;   LUMBAR LAMINECTOMY  2003   L4 - L5   NECK SURGERY  2017   PLANTAR FASCIA SURGERY     right   PLANTAR FASCIA SURGERY  02/2011   left   REDUCTION MAMMAPLASTY  2016   RIGHT URETEROSCOPIC STONE EXTRACTION  08/04/2000   SHOULDER ARTHROSCOPY WITH ROTATOR CUFF REPAIR Right 05/11/2020   Procedure: SHOULDER ARTHROSCOPY WITH ROTATOR CUFF REPAIR;  Surgeon: Leora Lynwood SAUNDERS, MD;  Location: [facility];  Service: Orthopedics;  Laterality: Right;   TONSILLECTOMY     TOTAL HIP ARTHROPLASTY Left 01/11/2022   Procedure: TOTAL HIP ARTHROPLASTY ANTERIOR APPROACH;  Surgeon: Leora Lynwood SAUNDERS, MD;  Location: [facility];  Service: Orthopedics;  Laterality: Left;   URETERAL STENT PLACEMENT  11/12/2011   Dr Likens    Home Medications:  No medications prior to admission.   Allergies:  Allergies  Allergen Reactions   Chlorhexidine  Gluconate Rash, Itching and Swelling    Developed severe rash where chloraprep was used on chest area   Ivp Dye [Iodinated Contrast Media] Rash and Other (See Comments)    Flushing, minor facial rash & dyspnea   Nalbuphine Shortness Of Breath and Rash    Nubain caused respiratory distress & rash   Septra [Sulfamethoxazole-Trimethoprim] Shortness Of Breath and Rash   Silicone Dermatitis   Sulfamethoxazole-Trimethoprim Rash, Dermatitis and Shortness Of Breath    rash   Adhesive [Tape] Rash   Cymbalta  [Duloxetine  Hcl]     Had drastic mood changes   Diclofenac Other (See Comments)    GI Upset   Humira [Adalimumab]     Did not like the way I felt   Methotrexate And Trimetrexate     GI upset     Wound Dressing Adhesive Swelling   Apremilast Rash and Dermatitis   Augmentin  [Amoxicillin -Pot Clavulanate] Nausea And Vomiting   Betadine [Povidone Iodine] Rash   Latex Rash    Family History  Problem Relation Age of Onset   Hypertension Mother    Colon polyps Mother    Atrial fibrillation Mother        Dr Verlin   Hyperlipidemia Mother     Heart disease Mother    Anxiety disorder Mother    Sleep apnea Mother    Obesity Mother    Heart attack Father 42   Diabetes Father    Hypertension Father    Hyperlipidemia Father    Heart disease Father    Stroke Father    Obesity Father    Hypertension Sister    Colon cancer Maternal Uncle    Breast cancer Paternal Aunt 70   Colon cancer Paternal Uncle    Colon polyps Maternal Grandmother    Stroke Maternal Grandmother 76   Stroke Maternal Grandfather 37   Heart attack Paternal Grandmother 26   Diabetes Paternal Grandfather    Social History:  reports that she has never smoked. She has never been exposed to tobacco smoke. She has never used smokeless tobacco. She reports current alcohol use. She reports that she does not use drugs.  ROS: A complete review of systems was performed.  All systems are negative except for pertinent findings as noted. ROS   Physical Exam:  Vital signs in last 24 hours:   General: NAD Respiratory: normal WOB on RA Cards: RRR per monitor   Laboratory Data:  No results found for this or any previous visit (from the past 24 hours). No results found for this or any previous visit (from the past 240 hours). Creatinine: No results for input(s): CREATININE in the last 168 hours.  Impression/Assessment:  3 F R ureteral stone.  We discussed risks benefits and alternatives to ESWL including bleeding, arrhythmias, failure to eliminate stone and pain. Patient voiced their understanding and consent was obtained.    Pt on Eliquis  has clearance per Verlin Lonni BIRCH, MD. TO hold Eliquis  for 2 days prior (confirmed)  Ucx + 11/25 for E. Coli treated with bactrim   KUB 11/25 stone below sacrum R side, seen on CT 12/4  Plan:  Proceed with R ESWL.    Steffan JAYSON Pea 03/10/2024, 10:12 PM

## 2024-03-11 ENCOUNTER — Ambulatory Visit (HOSPITAL_COMMUNITY)

## 2024-03-11 ENCOUNTER — Encounter (HOSPITAL_COMMUNITY): Payer: Self-pay | Admitting: Urology

## 2024-03-11 ENCOUNTER — Other Ambulatory Visit: Payer: Self-pay

## 2024-03-11 ENCOUNTER — Encounter (HOSPITAL_COMMUNITY): Admission: RE | Disposition: A | Payer: Self-pay | Source: Home / Self Care | Attending: Urology

## 2024-03-11 ENCOUNTER — Ambulatory Visit (HOSPITAL_COMMUNITY): Admission: RE | Admit: 2024-03-11 | Discharge: 2024-03-11 | Disposition: A | Attending: Urology | Admitting: Urology

## 2024-03-11 DIAGNOSIS — N201 Calculus of ureter: Secondary | ICD-10-CM | POA: Diagnosis not present

## 2024-03-11 DIAGNOSIS — Z96642 Presence of left artificial hip joint: Secondary | ICD-10-CM | POA: Diagnosis not present

## 2024-03-11 DIAGNOSIS — N200 Calculus of kidney: Secondary | ICD-10-CM | POA: Diagnosis not present

## 2024-03-11 HISTORY — PX: EXTRACORPOREAL SHOCK WAVE LITHOTRIPSY: SHX1557

## 2024-03-11 SURGERY — LITHOTRIPSY, ESWL
Anesthesia: LOCAL | Laterality: Right

## 2024-03-11 MED ORDER — CIPROFLOXACIN HCL 500 MG PO TABS
500.0000 mg | ORAL_TABLET | ORAL | Status: AC
  Administered 2024-03-11: 500 mg via ORAL
  Filled 2024-03-11: qty 1

## 2024-03-11 MED ORDER — DIPHENHYDRAMINE HCL 25 MG PO CAPS
25.0000 mg | ORAL_CAPSULE | ORAL | Status: AC
  Administered 2024-03-11: 25 mg via ORAL
  Filled 2024-03-11: qty 1

## 2024-03-11 MED ORDER — DIAZEPAM 5 MG PO TABS
10.0000 mg | ORAL_TABLET | ORAL | Status: AC
  Administered 2024-03-11: 10 mg via ORAL
  Filled 2024-03-11: qty 2

## 2024-03-11 MED ORDER — KETOROLAC TROMETHAMINE 30 MG/ML IJ SOLN: 15.0000 mg | Freq: Once | INTRAMUSCULAR | Status: DC

## 2024-03-11 MED ORDER — ONDANSETRON HCL 4 MG PO TABS: 4.0000 mg | ORAL_TABLET | Freq: Three times a day (TID) | ORAL | 0 refills | Status: AC | PRN

## 2024-03-11 MED ORDER — SODIUM CHLORIDE 0.9 % IV SOLN: INTRAVENOUS | Status: DC

## 2024-03-11 MED ORDER — ALFUZOSIN HCL ER 10 MG PO TB24: 10.0000 mg | ORAL_TABLET | Freq: Every day | ORAL | 0 refills | Status: AC

## 2024-03-11 MED ORDER — HYDROCODONE-ACETAMINOPHEN 5-325 MG PO TABS: 1.0000 | ORAL_TABLET | Freq: Four times a day (QID) | ORAL | 0 refills | Status: AC | PRN

## 2024-03-11 NOTE — Discharge Instructions (Addendum)
 DISCHARGE INSTRUCTIONS FOR KIDNEY STONE/ESWL  ESWL on 12/8:  Due to your inability to tolerate the the ESWL secondary to pain and increased need for narcotic medication we felt that it was best to cancel the surgery and set you of for ureteroscopy (this is the procedure that requires the stent).  MEDICATIONS:  1.  Hydrocodone  2. Tamsulosin   You can restart your Eliquis  on Wednesday 12/10.   ACTIVITY:  1. No strenuous activity x 1week  2. No driving while on narcotic pain medications  3. Drink plenty of water   4. Continue to walk at home - it is normal to see blood in the urine while the stent is in place, so keep active, but don't over do it.  5. May return to work/school tomorrow or when you feel ready  6. You may experience some pain when urinating in the kidney on the side that was operated on while the stent is in place this is normal  BATHING:  1. You can shower and we recommend daily showers  2. You have a string coming from your urethra: The stent string is attached to your ureteral stent. Do not pull on this.   SIGNS/SYMPTOMS TO CALL:  Please call us  if you have a fever greater than 101.5, uncontrolled nausea/vomiting, uncontrolled pain, dizziness, unable to urinate, bloody urine with clots greater than the size of a quarter, chest pain, shortness of breath, leg swelling, leg pain, redness around wound, drainage from wound, or any other concerns or questions.   You can reach us  at [phone].   FOLLOW-UP:  1. You will be called by [facility] to set up Ureteroscopy.

## 2024-03-11 NOTE — Progress Notes (Signed)
 Short Stay Nursing Note Post Litho Nursing Note: Arrived to [facility] Procedural Area, MD and RN at pt side, assisting with ventilation with BVM, nasal airway inserted by Litho RN per MD orders. 0.4mg  Narcan given, slow response noted, 0.2mg  Romazicon IV given, pt opening eyes, able to take deep breaths, able to answer questions, mae x 4, NSR noted on monitor. Pt returned to Short Stay Area for 90 min post procedure monitoring, Cardiac, ETCO2, NBP, cont POX implemented. VSS, NSR noted on monitor, A&OX 3, mae x 4, color WNL, strong radial pulses. Resp even and non-labored, BS clear bilaterally, , vs assessment q15 min until DC, will DC per Aldrete Score and meeting other DC criteria per MD orders. Client currently denies any pain, requesting PO fluids, provided to client, family at bedside. Pt / family teaching done.

## 2024-03-11 NOTE — Progress Notes (Signed)
 Short Stay Nursing Note: Post Litho Discharge Note: Client alert and oriented, VSS, NSR noted throughout post procedural monitoring, remained alert and oriented, ETCO2 38-42/min, POX remained greater than 94% on RA, NBP stable. Denies any pain, tolerating POs well, able to ambulate and was able to void prior to DC. Aldrete Score remained at 10 throughout post procedural monitoring, family here with pt, AVS reviewed with client and family member. Client taken to DC area via wc.

## 2024-03-11 NOTE — Op Note (Addendum)
 ESWL Operative Note  Treating Physician: Steffan Pea, MD  Pre-op diagnosis: Right distal ureteral stone   Post-op diagnosis: Same   Procedure: Right ESWL  See Norita Dustman OP note scanned into chart. Also because of the size, density, location and other factors that cannot be anticipated I feel this will likely be a staged procedure. This fact supersedes any indication in the scanned [place] stone operative note to the contrary.  Addendum Note: During the case the patient had diastolic blood pressure > 100, pt was given Hydralazine 10mg , she also had very poor pain management. We paused the procedure and asked the patient if wanted to proceed. She stated that she wanted to try. She received and additional of fentanyl  (Patient received a total of 350 of fentanyl  for the case) with the last dose her respiratory drive decreased and the patient became hypoxic. The patient was given oxygen and bag masked on the table, oxygen levels never decreased below 65. A nasal airway was placed to improve oxygenation. Decision was made to give 0.4mg  of Narcan and the patient respiratory effort improved to oxygen levels > 98. The patient responded well and returned to normal function. The entire event and provider response lasted a total of 3 minutes. The patient was sent to PACU for observation for 2 hours. The patient was discharged home after observation. Patient will be set up for URS.   Steffan Pea, MD [facility]

## 2024-03-12 ENCOUNTER — Encounter (HOSPITAL_COMMUNITY): Payer: Self-pay | Admitting: Urology

## 2024-03-12 ENCOUNTER — Other Ambulatory Visit: Payer: Self-pay | Admitting: Urology

## 2024-03-13 ENCOUNTER — Other Ambulatory Visit: Payer: Self-pay | Admitting: Internal Medicine

## 2024-03-13 MED ORDER — LISINOPRIL 2.5 MG PO TABS: 2.5000 mg | ORAL_TABLET | Freq: Every day | ORAL | 1 refills | Status: DC

## 2024-03-16 ENCOUNTER — Other Ambulatory Visit: Payer: Self-pay | Admitting: Internal Medicine

## 2024-03-21 ENCOUNTER — Ambulatory Visit: Admit: 2024-03-21 | Admitting: Urology

## 2024-03-21 SURGERY — CYSTOSCOPY/URETEROSCOPY/HOLMIUM LASER/STENT PLACEMENT
Anesthesia: General | Laterality: Right

## 2024-04-06 NOTE — Progress Notes (Unsigned)
 "     Electrophysiology Clinic Note    Date:  04/08/2024  Patient ID:  [name], [name] 16-Jul-1970, MRN [id number] PCP:  Geofm Glade PARAS, MD  Cardiologist:  Lonni Cash, MD  Electrophysiologist:  Fonda Kitty, MD  Electrophysiology APP:  Detrice Cales, NP     Discussed the use of AI scribe software for clinical note transcription with the patient, who gave verbal consent to proceed.   Patient Profile    Chief Complaint: AFib  History of Present Illness: [name] is a 54 y.o. female with PMH notable for persis AFib, HTN, hypothyroid, depression, anxiety; seen today for Fonda Kitty, MD (previously Dr. Cindie) for routine electrophysiology followup.   Patient last saw Dr. Cindie 01/2024 for initial EP evaluation of her atrial fibrillation.  At that visit she described that her atrial fibrillation burden had been much better controlled since starting metoprolol .  She was using Apple Watch to monitor her burden that confirmed low burden.  Patient preferred conservative management, lopressor  was adjusted to BID toprol   for atrial fibrillation.  Sleep study was recommended which has not been completed.  On follow- up today, she is not aware of any further AFib episodes since initial episode in July 2025. She continues to wear apple watch that confirms no AFib. Because of being on eliquis , she has had to stop her NSAIDs for OA and her joints are quite painful and this has led to less exercise capacity and weight gain. No bleeding concerns on eliquis .     Arrhythmia/Device History No specialty comments available.    ROS:  Please see the history of present illness. All other systems are reviewed and otherwise negative.    Physical Exam    VS:  BP 112/72 (BP Location: Left Arm, Patient Position: Sitting, Cuff Size: Normal)   Pulse 77   Ht 5' 8 (1.727 m)   Wt 224 lb 12.8 oz (102 kg)   SpO2 99%   BMI 34.18 kg/m  BMI: Body mass index is 34.18 kg/m.     STOP-Bang Score:  4         Wt Readings from Last 3 Encounters:  04/08/24 224 lb 12.8 oz (102 kg)  03/11/24 222 lb (100.7 kg)  02/06/24 219 lb (99.3 kg)      GEN- The patient is well appearing, alert and oriented x 3 today.   Lungs- Clear to ausculation bilaterally, normal work of breathing.  Heart- Regular rate and rhythm, no murmurs, rubs or gallops Extremities- No peripheral edema, warm, dry   Studies Reviewed   Previous EP, cardiology notes.    EKG is ordered. Personal review of EKG from today shows:    EKG Interpretation Date/Time:  Monday April 08 2024 10:57:03 EST Ventricular Rate:  77 PR Interval:  160 QRS Duration:  82 QT Interval:  398 QTC Calculation: 450 R Axis:   18  Text Interpretation: Normal sinus rhythm Confirmed by Wm Fruchter [phone]) on 04/08/2024 11:02:53 AM    TTE, 11/02/2023 1. Left ventricular ejection fraction, by estimation, is 55 to 60%. Left ventricular ejection fraction by PLAX is 60 %. The left ventricle has normal function. The left ventricle has no regional wall motion abnormalities. There is mild left ventricular hypertrophy. Left ventricular diastolic parameters are indeterminate.  2. Right ventricular systolic function is normal. The right ventricular size is normal.  3. The mitral valve is normal in structure. No evidence of mitral valve regurgitation. No evidence of mitral stenosis.  4. The  aortic valve is normal in structure. Aortic valve regurgitation is not visualized. No aortic stenosis is present.  5. The inferior vena cava is normal in size with greater than 50% respiratory variability, suggesting right atrial pressure of 3 mmHg.  6. Rhythm is atrial fibrillation, rate 70 to 100   Cardiac CTA, 06/11/2021 1. No evidence of CAD, CADRADS = 0.  2. Coronary calcium score of 0. This was 0 percentile for age and sex matched control.  3. Normal coronary origin with right dominance.  4. Dilated pulmonary artery at 29 mm, suggestive of  pulmonary hypertension.   Assessment and Plan     #) persis AFib Initially diagnosed 10/2023 during episode of intense anxiety related to caregiving responsibilities Has not had any further AF episodes, using apple watch to monitor Continue 75mg  toprol  BID  #) Hypercoag d/t afib Long discussion with patient related to her afib and stroke risk. Given low chadsvasc score of 2 (HTN, female) and monitoring strategy in place with apple watch, it is reasonable to stop eliquis . She understands that no monitoring strategy is error-proof and is agreeable to restarting eliquis  should her AFib burden increase.  We also very briefly discussed Watchman procedure should she need to resume eliquis    #) HTN Well controlled in office Continue 75mg  toprol  BID Continue 2.5mg  lisinopril  Continue to monitor BP 1-2 hours after taking medications       Current medicines are reviewed at length with the patient today.   The patient has concerns regarding her medicines.  The following changes were made today:   STOP eliquis   Labs/ tests ordered today include:  Orders Placed This Encounter  Procedures   EKG 12-Lead     Disposition: Follow up with Dr. Kennyth or EP APP in 6 months   Signed, Drago Hammonds, NP  04/08/2024  12:21 PM  Electrophysiology [facility] "

## 2024-04-08 ENCOUNTER — Encounter: Payer: Self-pay | Admitting: Cardiology

## 2024-04-08 ENCOUNTER — Encounter: Payer: Self-pay | Admitting: Internal Medicine

## 2024-04-08 ENCOUNTER — Ambulatory Visit: Attending: Cardiology | Admitting: Cardiology

## 2024-04-08 VITALS — BP 112/72 | HR 77 | Ht 68.0 in | Wt 224.8 lb

## 2024-04-08 DIAGNOSIS — I4819 Other persistent atrial fibrillation: Secondary | ICD-10-CM

## 2024-04-08 DIAGNOSIS — I10 Essential (primary) hypertension: Secondary | ICD-10-CM | POA: Diagnosis not present

## 2024-04-08 DIAGNOSIS — D6869 Other thrombophilia: Secondary | ICD-10-CM | POA: Diagnosis not present

## 2024-04-08 DIAGNOSIS — I899 Noninfective disorder of lymphatic vessels and lymph nodes, unspecified: Secondary | ICD-10-CM

## 2024-04-08 DIAGNOSIS — G4730 Sleep apnea, unspecified: Secondary | ICD-10-CM

## 2024-04-08 NOTE — Patient Instructions (Signed)
 Medication Instructions:  Your physician recommends the following medication changes.  STOP TAKING: Eliquis   *If you need a refill on your cardiac medications before your next appointment, please call your pharmacy*  Lab Work: No labs ordered today    Testing/Procedures: No test ordered today   Follow-Up: At [facility], you and your health needs are our priority.  As part of our continuing mission to provide you with exceptional heart care, our providers are all part of one team.  This team includes your primary Cardiologist (physician) and Advanced Practice Providers or APPs (Physician Assistants and Nurse Practitioners) who all work together to provide you with the care you need, when you need it.  Your next appointment:   6 month(s)  Provider:   Suzann Riddle, NP     Follow up with Rheumatology.

## 2024-04-16 ENCOUNTER — Encounter (INDEPENDENT_AMBULATORY_CARE_PROVIDER_SITE_OTHER): Payer: Self-pay | Admitting: Family Medicine

## 2024-04-16 ENCOUNTER — Ambulatory Visit (INDEPENDENT_AMBULATORY_CARE_PROVIDER_SITE_OTHER): Payer: Self-pay | Admitting: Family Medicine

## 2024-04-16 VITALS — BP 111/73 | HR 81 | Temp 98.3°F | Ht 68.0 in | Wt 218.0 lb

## 2024-04-16 DIAGNOSIS — Z6833 Body mass index (BMI) 33.0-33.9, adult: Secondary | ICD-10-CM | POA: Diagnosis not present

## 2024-04-16 DIAGNOSIS — E1169 Type 2 diabetes mellitus with other specified complication: Secondary | ICD-10-CM

## 2024-04-16 DIAGNOSIS — F5089 Other specified eating disorder: Secondary | ICD-10-CM

## 2024-04-16 DIAGNOSIS — F32A Depression, unspecified: Secondary | ICD-10-CM | POA: Insufficient documentation

## 2024-04-16 DIAGNOSIS — E669 Obesity, unspecified: Secondary | ICD-10-CM

## 2024-04-16 DIAGNOSIS — E559 Vitamin D deficiency, unspecified: Secondary | ICD-10-CM

## 2024-04-16 DIAGNOSIS — Z7985 Long-term (current) use of injectable non-insulin antidiabetic drugs: Secondary | ICD-10-CM | POA: Diagnosis not present

## 2024-04-16 DIAGNOSIS — F3289 Other specified depressive episodes: Secondary | ICD-10-CM

## 2024-04-16 DIAGNOSIS — E119 Type 2 diabetes mellitus without complications: Secondary | ICD-10-CM

## 2024-04-16 MED ORDER — TOPIRAMATE 50 MG PO TABS: 50.0000 mg | ORAL_TABLET | Freq: Every day | ORAL | 0 refills | Status: AC

## 2024-04-16 MED ORDER — VITAMIN D (ERGOCALCIFEROL) 1.25 MG (50000 UNIT) PO CAPS: 50000.0000 [IU] | ORAL_CAPSULE | ORAL | 0 refills | Status: AC

## 2024-04-16 MED ORDER — TIRZEPATIDE 15 MG/0.5ML ~~LOC~~ SOAJ: 15.0000 mg | SUBCUTANEOUS | 0 refills | Status: AC

## 2024-04-16 NOTE — Progress Notes (Signed)
 "  Office: [phone]  /  Fax: [phone]  WEIGHT SUMMARY AND BIOMETRICS  Anthropometric Measurements Height: 5' 8 (1.727 m) Weight: 218 lb (98.9 kg) BMI (Calculated): 33.15 Weight at Last Visit: 218 lb Weight Lost Since Last Visit: 0 Weight Gained Since Last Visit: 0 Starting Weight: 241 lb Total Weight Loss (lbs): 23 lb (10.4 kg) Peak Weight: 251 lb   Body Composition  Body Fat %: 43.8 % Fat Mass (lbs): 95.6 lbs Muscle Mass (lbs): 116.4 lbs Total Body Water  (lbs): 82.2 lbs Visceral Fat Rating : 11   Other Clinical Data Fasting: no Labs: no Today's Visit #: 91 Starting Date: 09/28/16    Chief Complaint: OBESITY    History of Present Illness [name] is a 54 year old female with obesity and type 2 diabetes who presents for obesity treatment and progress assessment. She is accompanied by her mother.  She is following a prescribed journaling plan with 1500 calories and 90 mg of protein, with about 40% adherence over the holidays. Despite this, she has maintained her weight since [address], Thanksgiving, Christmas, and New Year's.  Her type 2 diabetes is managed with diet, exercise, weight loss, and Mounjaro  15 mg weekly.  Recently, she experienced gastrointestinal symptoms including nausea, vomiting, and diarrhea, which started this past week. The vomiting and diarrhea have subsided, but nausea persists. No fever was present, and she attributes the symptoms to a stomach bug that her husband also contracted. She has received her flu shot this year.  She has a history of atrial fibrillation but has not experienced any episodes since July and August of the previous year. She was taken off Eliquis  two weeks ago and is currently on metoprolol  and lisinopril  2.5 mg. She reports chronic pain and fatigue, which she attributes to her inability to take pain medications while on Eliquis . She is currently taking Celebrex  200 mg twice daily, which she finds  helpful.  She reports emotional eating behaviors and is on topiramate  50 mg daily to manage this. She requests a refill for her topiramate  and vitamin D , which she takes as a prescription over calcifediol , 50,000 IU per week.      PHYSICAL EXAM:  Blood pressure 111/73, pulse 81, temperature 98.3 F (36.8 C), height 5' 8 (1.727 m), weight 218 lb (98.9 kg), SpO2 97%. Body mass index is 33.15 kg/m.  DIAGNOSTIC DATA REVIEWED BY MYSELF TODAY:  BMET    Component Value Date/Time   NA 139 01/30/2024 1036   K 4.4 01/30/2024 1036   CL 107 (H) 01/30/2024 1036   CO2 18 (L) 01/30/2024 1036   GLUCOSE 77 01/30/2024 1036   GLUCOSE 102 (H) 11/03/2023 0430   BUN 14 01/30/2024 1036   CREATININE 0.90 01/30/2024 1036   CALCIUM 9.1 01/30/2024 1036   GFRNONAA >60 11/03/2023 0430   GFRAA 125 11/11/2019 1049   Lab Results  Component Value Date   HGBA1C 5.3 01/30/2024   HGBA1C 6.1 09/22/2010   Lab Results  Component Value Date   INSULIN  13.1 01/30/2024   INSULIN  14.7 09/28/2016   Lab Results  Component Value Date   TSH 2.510 01/30/2024   CBC    Component Value Date/Time   WBC 6.7 01/30/2024 1036   WBC 10.2 11/03/2023 0430   RBC 4.58 01/30/2024 1036   RBC 4.35 11/03/2023 0430   HGB 12.9 01/30/2024 1036   HCT 39.6 01/30/2024 1036   PLT 294 01/30/2024 1036   MCV 87 01/30/2024 1036   MCH 28.2 01/30/2024  1036   MCH 28.0 11/03/2023 0430   MCHC 32.6 01/30/2024 1036   MCHC 31.9 11/03/2023 0430   RDW 13.8 01/30/2024 1036   Iron Studies No results found for: IRON, TIBC, FERRITIN, IRONPCTSAT Lipid Panel     Component Value Date/Time   CHOL 184 01/30/2024 1036   TRIG 91 01/30/2024 1036   HDL 50 01/30/2024 1036   CHOLHDL 3 09/08/2023 0959   VLDL 10.4 09/08/2023 0959   LDLCALC 117 (H) 01/30/2024 1036   Hepatic Function Panel     Component Value Date/Time   PROT 6.8 01/30/2024 1036   ALBUMIN 4.1 01/30/2024 1036   AST 22 01/30/2024 1036   ALT 16 01/30/2024 1036    ALKPHOS 120 01/30/2024 1036   BILITOT 0.3 01/30/2024 1036   BILIDIR 0.1 06/12/2013 1632   IBILI 0.2 06/11/2009 2309      Component Value Date/Time   TSH 2.510 01/30/2024 1036   Nutritional Lab Results  Component Value Date   VD25OH 53.0 01/30/2024   VD25OH 41.54 09/08/2023   VD25OH 50.9 04/19/2023     Assessment and Plan Assessment & Plan Obesity Management with a focus on weight loss and dietary adherence. She has been following a 1500 calorie and 90 mg protein diet plan about 40% of the time over the holidays. Weight has been maintained since Halloween, which is better than expected. Current weight goal is to return to 195 lbs, with a long-term goal of 170 lbs by the end of December. Discussed the importance of maintaining adequate nutrition and metabolism, and the potential need to adjust Mounjaro  dosage if appetite is suppressed. Discussed the use of pre-prepared meals to facilitate adherence to dietary goals. Consideration of alternative meal options such as Factor and Hungry Roots for additional nutritional support. - Continue Mounjaro  15 mg weekly. - Provided list of pre-prepared meal recipes to meet calorie and protein goals. - Will consider adjusting Mounjaro  dosage if appetite suppression persists. - Discussed alternative meal options such as Factor and Hungry Roots. - work on restarting exercise now that pain has improved  Type 2 diabetes mellitus Managed with diet, exercise, weight loss, and Mounjaro . - Continue current diabetes management plan with diet, exercise, weight loss, and Mounjaro .  Vitamin D  deficiency Managed with prescription calcifediol  50,000 IU weekly. - Refilled calcifediol  50,000 IU weekly.  Emotional eating behaviors Managed with topiramate  50 mg daily. She is stable on this regimen. - Refilled topiramate  50 mg daily.      Patients who are on anti-obesity medications are counseled on the importance of maintaining healthy lifestyle habits,  including balanced nutrition, regular physical activity, and behavioral modifications,  Medication is an adjunct to, not a replacement for, lifestyle changes and that the long-term success and weight maintenance depend on continued adherence to these strategies.   [name] was informed of the importance of frequent follow up visits to maximize her success with intensive lifestyle modifications for her obesity and obesity related health conditions as recommended by USPSTF and CMS guidelines  Louann Penton, MD   "

## 2024-04-23 NOTE — Progress Notes (Unsigned)
 "  Office Visit Note  Patient: [name]             Date of Birth: 1970-10-08           MRN: [id number]             PCP: Geofm Glade PARAS, MD Referring: Geofm Glade PARAS, MD Visit Date: 05/03/2024 Occupation: Data Unavailable  Subjective:  No chief complaint on file.   History of Present Illness: [name] is a 54 y.o. female ***     Activities of Daily Living:  Patient reports morning stiffness for *** {minute/hour:19697}.   Patient {ACTIONS;DENIES/REPORTS:21021675::Denies} nocturnal pain.  Difficulty dressing/grooming: {ACTIONS;DENIES/REPORTS:21021675::Denies} Difficulty climbing stairs: {ACTIONS;DENIES/REPORTS:21021675::Denies} Difficulty getting out of chair: {ACTIONS;DENIES/REPORTS:21021675::Denies} Difficulty using hands for taps, buttons, cutlery, and/or writing: {ACTIONS;DENIES/REPORTS:21021675::Denies}  No Rheumatology ROS completed.   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   Allergic rhinitis 06/27/2022   BMI 32.0-32.9,adult 05/19/2022   Generalized obesity 04/21/2022   Type 2 diabetes mellitus with other specified complication (HCC) 03/10/2022   Other constipation 02/03/2022   History of total hip replacement, left 01/11/2022   Mixed hyperlipidemia 12/09/2021   Lumbar radiculopathy 01/01/2021   Non-recurrent unilateral inguinal hernia without obstruction or gangrene 11/13/2020   Psoriasis 11/04/2020   Psoriatic arthritis (HCC) - Dr Tobie, [facility] 08/25/2020   Generalized osteoarthritis 08/25/2020   Chronic back pain 12/24/2019   Hyperhidrosis 08/17/2017   Insomnia 06/06/2017   Depression with anxiety 05/16/2017   Umbilical hernia 02/26/2017   Diverticulosis of colon 02/26/2017   URI (upper  respiratory infection) 11/24/2016   Asthma 11/24/2016   Vitamin D  deficiency 11/09/2016   Right shoulder pain 10/11/2016   AC (acromioclavicular) joint arthritis 09/13/2016   Postsurgical menopause 08/16/2016   Mucoid cyst of joint 11/04/2015   External hemorrhoid 08/20/2015   Prediabetes 06/05/2015   GERD (gastroesophageal reflux disease) 06/04/2015   Status post bilateral breast reduction 04/01/2015   Cervical disc disorder with radiculopathy of cervical region 05/23/2014   Ulnar neuropathy 01/16/2014   Fibromyalgia 01/16/2014   Recurrent nephrolithiasis 04/29/2013   Panic attacks 01/15/2013   IBS (irritable bowel syndrome)-C 12/14/2011   B12 deficiency 06/30/2009   Essential hypertension 03/02/2007    Past Medical History:  Diagnosis Date   Anemia    Anxiety    Arthritis    inflammitory   Asthma    B12 deficiency    Back pain    Chest pain    a. 09/2011 ETT: Ex time 4:13, max HR 152. No ST/T changes-->low risk; b. 06/2021 Cor CTA: Ca2+ = 0. Nl cors.   Chronic joint pain    Constipation    DDD (degenerative disc disease), lumbar    low back and neck and shoulders   Depression    during divorce & legal matters   Diastolic dysfunction    a. 09/2011 Echo: EF 60-65%, no rwma; b. 06/2021 Echo: EF 55-60%, no rwma, GrI DD, nl RV fxn.   Fatty liver    Fibromyalgia    Gallbladder problem    GERD (gastroesophageal reflux disease)    History of kidney stones    Dr rosalynn Likens   History of polycystic ovarian disease S/P BSO   Hx of anxiety disorder    Hypertension    a  Hypothyroidism    no meds   IBS (irritable bowel syndrome)    Infertility, female    Joint pain    Lactose intolerance    Lupus hx positive ANA   followed by Dr Jon Jacob; possible lupus   Multiple food allergies    Myalgia    Osteoarthritis    Polyarthritis, 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HCC)    POLYCYSTIC OVARIAN DISEASE 03/02/2007   Qualifier: Diagnosis of  By: Tish MD, Elsie POUR BSO for cysts &  endometriosis; Dr Thyra, Gyn Seeing Dr Nathanel Bunker     PONV (postoperative nausea and vomiting)    Pre-diabetes    Sweating profusely    Symptomatic mammary hypertrophy    URI (upper respiratory infection)    currently on cefdinir     Family History  Problem Relation Age of Onset   Hypertension Mother    Colon polyps Mother    Atrial fibrillation Mother        Dr Verlin   Hyperlipidemia Mother    Heart disease Mother    Anxiety disorder Mother    Sleep apnea Mother    Obesity Mother    Heart attack Father 71   Diabetes Father    Hypertension Father    Hyperlipidemia Father    Heart disease Father    Stroke Father    Obesity Father    Hypertension Sister    Colon cancer Maternal Uncle    Breast cancer Paternal Aunt 12   Colon cancer Paternal Uncle    Colon polyps Maternal Grandmother    Stroke Maternal Grandmother 76   Stroke Maternal Grandfather 37   Heart attack Paternal Grandmother 26   Diabetes Paternal Grandfather    Past Surgical History:  Procedure Laterality Date   ABDOMINAL HYSTERECTOMY     ANKLE SURGERY     X3   APPENDECTOMY  1991   exploratory lap   BACK SURGERY     BREAST REDUCTION SURGERY Bilateral 03/26/2015   Procedure: BILATERAL BREAST REDUCTION ;  Surgeon: Estefana GORMAN Fritter, DO;  Location: [facility];  Service: Plastics;  Laterality: Bilateral;   CARPAL TUNNEL RELEASE     bilateral; Dr Alix   COLONOSCOPY     in 1990s   CYSTO/ RIGHT RETROGRADE URETERAL PYELOGRAM  07/09/2004   HX BILATERAL RENAL STONES/ RIGHT FLANK PAIN   CYSTOSCOPY W/ URETERAL STENT PLACEMENT  11/12/2011   Procedure: CYSTOSCOPY WITH RETROGRADE PYELOGRAM/URETERAL STENT PLACEMENT;  Surgeon: Ricardo Likens, MD;  Location: [facility];  Service: Urology;  Laterality: Right;   CYSTOSCOPY W/ URETERAL STENT REMOVAL  12/07/2011   Procedure: CYSTOSCOPY WITH STENT REMOVAL;  Surgeon: Ricardo Likens, MD;  [facility];  Service: Urology;   Laterality: Right;   CYSTOSCOPY/RETROGRADE/URETEROSCOPY/STONE EXTRACTION WITH BASKET  12/07/2011   Procedure: CYSTOSCOPY/RETROGRADE/URETEROSCOPY/STONE EXTRACTION WITH BASKET;  Surgeon: Ricardo Likens, MD;  Location: [facility];  Service: Urology;  Laterality: Right;   DILATION AND CURETTAGE OF UTERUS     X5   EXCISION LEFT BARTHOLIN GLAND  02/05/2002   EXTRACORPOREAL SHOCK WAVE LITHOTRIPSY Right 03/11/2024   Procedure: LITHOTRIPSY, ESWL;  Surgeon: Shane Steffan BROCKS, MD;  Location: [facility];  Service: Urology;  Laterality: Right;   EYE SURGERY     Retinal tears surgery bilateral   LAPAROSCOPIC ASSISTED VAGINAL HYSTERECTOMY  2000   LAPAROSCOPIC CHOLECYSTECTOMY  05/31/2007   LAPAROSCOPIC LASER ABLATION ENDOMETRIOSIS AND LEFT SALPINGO-OOPHORECTOMY  11/03/1999   LAPAROSCOPY WITH RIGHT SALPINGO-OOPHECTOMY/ LYSIS ADHESIONS AND ABLATION ENDOMETRIOSIS  05/17/2001  LIPOSUCTION Bilateral 03/26/2015   Procedure: WITH LIPOSUCTION;  Surgeon: Estefana GORMAN Fritter, DO;  Location: [facility];  Service: Plastics;  Laterality: Bilateral;   LUMBAR LAMINECTOMY  2003   L4 - L5   NECK SURGERY  2017   PLANTAR FASCIA SURGERY     right   PLANTAR FASCIA SURGERY  02/2011   left   REDUCTION MAMMAPLASTY  2016   RIGHT URETEROSCOPIC STONE EXTRACTION  08/04/2000   SHOULDER ARTHROSCOPY WITH ROTATOR CUFF REPAIR Right 05/11/2020   Procedure: SHOULDER ARTHROSCOPY WITH ROTATOR CUFF REPAIR;  Surgeon: Leora Lynwood SAUNDERS, MD;  Location: [facility];  Service: Orthopedics;  Laterality: Right;   TONSILLECTOMY     TOTAL HIP ARTHROPLASTY Left 01/11/2022   Procedure: TOTAL HIP ARTHROPLASTY ANTERIOR APPROACH;  Surgeon: Leora Lynwood SAUNDERS, MD;  Location: [facility];  Service: Orthopedics;  Laterality: Left;   URETERAL STENT PLACEMENT  11/12/2011   Dr Alvaro   Social History[1] Social History   Social History Narrative   Patient Lives at home with her husband Sandor)   Disabled.   Education two years of  college.   Right handed.   Caffeine coffee and sweet tea. Not daily.     Immunization History  Administered Date(s) Administered   Influenza Split 02/17/2011   Influenza Whole 01/02/2010   Influenza, Mdck, Trivalent,PF 6+ MOS(egg free) 01/25/2023   Influenza, Seasonal, Injecte, Preservative Fre 01/16/2024   Influenza,inj,Quad PF,6+ Mos 01/02/2013, 01/11/2017, 01/03/2018, 02/07/2019, 01/12/2022   Influenza-Unspecified 01/01/2014, 01/08/2016, 01/11/2017, 03/04/2020, 01/18/2021   PFIZER(Purple Top)SARS-COV-2 Vaccination 07/16/2019, 08/06/2019, 03/12/2020   Pneumococcal Polysaccharide-23 06/04/2008, 05/02/2013   Tdap 08/14/2012   Zoster Recombinant(Shingrix) 08/23/2020     Objective: Vital Signs: There were no vitals taken for this visit.   Physical Exam   Musculoskeletal Exam: ***  CDAI Exam: CDAI Score: -- Patient Global: --; Provider Global: -- Swollen: --; Tender: -- Joint Exam 05/03/2024   No joint exam has been documented for this visit   There is currently no information documented on the homunculus. Go to the Rheumatology activity and complete the homunculus joint exam.  Investigation: No additional findings.  Imaging: No results found.  Recent Labs: Lab Results  Component Value Date   WBC 6.7 01/30/2024   HGB 12.9 01/30/2024   PLT 294 01/30/2024   NA 139 01/30/2024   K 4.4 01/30/2024   CL 107 (H) 01/30/2024   CO2 18 (L) 01/30/2024   GLUCOSE 77 01/30/2024   BUN 14 01/30/2024   CREATININE 0.90 01/30/2024   BILITOT 0.3 01/30/2024   ALKPHOS 120 01/30/2024   AST 22 01/30/2024   ALT 16 01/30/2024   PROT 6.8 01/30/2024   ALBUMIN 4.1 01/30/2024   CALCIUM 9.1 01/30/2024   GFRAA 125 11/11/2019    Speciality Comments: No specialty comments available.  Procedures:  No procedures performed Allergies: Chlorhexidine  gluconate, Ivp dye [iodinated contrast media], Nalbuphine, Septra [sulfamethoxazole-trimethoprim], Silicone, Sulfamethoxazole-trimethoprim,  Adhesive [tape], Cymbalta  [duloxetine  hcl], Diclofenac, Humira [adalimumab], Methotrexate and trimetrexate, Wound dressing adhesive, Apremilast, Augmentin  [amoxicillin -pot clavulanate], Betadine [povidone iodine], and Latex   Assessment / Plan:     Visit Diagnoses: No diagnosis found.  Orders: No orders of the defined types were placed in this encounter.  No orders of the defined types were placed in this encounter.   Face-to-face time spent with patient was *** minutes. Greater than 50% of time was spent in counseling and coordination of care.  Follow-Up Instructions: No follow-ups on file.   Alfonso Patterson, LPN  Note - This record  has been created using [employer].  Chart creation errors have been sought, but may not always  have been located. Such creation errors do not reflect on  the standard of medical care.    [1]  Social History Tobacco Use   Smoking status: Never    Passive exposure: Never   Smokeless tobacco: Never  Vaping Use   Vaping status: Never Used  Substance Use Topics   Alcohol use: Yes    Comment: rarely   Drug use: No   "

## 2024-04-24 ENCOUNTER — Other Ambulatory Visit: Payer: Self-pay | Admitting: General Surgery

## 2024-04-24 DIAGNOSIS — R1909 Other intra-abdominal and pelvic swelling, mass and lump: Secondary | ICD-10-CM

## 2024-05-02 ENCOUNTER — Inpatient Hospital Stay: Admission: RE | Admit: 2024-05-02 | Source: Ambulatory Visit

## 2024-05-03 ENCOUNTER — Ambulatory Visit

## 2024-05-08 ENCOUNTER — Encounter: Payer: Self-pay | Admitting: Internal Medicine

## 2024-05-08 DIAGNOSIS — I10 Essential (primary) hypertension: Secondary | ICD-10-CM

## 2024-05-09 MED ORDER — LISINOPRIL 5 MG PO TABS: 5.0000 mg | ORAL_TABLET | Freq: Every day | ORAL | 3 refills | Status: AC

## 2024-05-09 NOTE — Progress Notes (Unsigned)
 "  Office Visit Note  Patient: [name]             Date of Birth: Aug 06, 1970           MRN: [id number]             PCP: Geofm Glade PARAS, MD Referring: Geofm Glade PARAS, MD Visit Date: 05/17/2024 Occupation: Data Unavailable  Subjective:  No chief complaint on file.   History of Present Illness: [name] is a 54 y.o. female ***     Activities of Daily Living:  Patient reports morning stiffness for *** {minute/hour:19697}.   Patient {ACTIONS;DENIES/REPORTS:21021675::Denies} nocturnal pain.  Difficulty dressing/grooming: {ACTIONS;DENIES/REPORTS:21021675::Denies} Difficulty climbing stairs: {ACTIONS;DENIES/REPORTS:21021675::Denies} Difficulty getting out of chair: {ACTIONS;DENIES/REPORTS:21021675::Denies} Difficulty using hands for taps, buttons, cutlery, and/or writing: {ACTIONS;DENIES/REPORTS:21021675::Denies}  No Rheumatology ROS completed.   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   Allergic rhinitis 06/27/2022   BMI 32.0-32.9,adult 05/19/2022   Generalized obesity 04/21/2022   Type 2 diabetes mellitus with other specified complication (HCC) 03/10/2022   Other constipation 02/03/2022   History of total hip replacement, left 01/11/2022   Mixed hyperlipidemia 12/09/2021   Lumbar radiculopathy 01/01/2021   Non-recurrent unilateral inguinal hernia without obstruction or gangrene 11/13/2020   Psoriasis 11/04/2020   Psoriatic arthritis (HCC) - Dr Tobie, [facility] 08/25/2020   Generalized osteoarthritis 08/25/2020   Chronic back pain 12/24/2019   Hyperhidrosis 08/17/2017   Insomnia 06/06/2017   Depression with anxiety 05/16/2017   Umbilical hernia 02/26/2017   Diverticulosis of colon 02/26/2017   URI (upper  respiratory infection) 11/24/2016   Asthma 11/24/2016   Vitamin D  deficiency 11/09/2016   Right shoulder pain 10/11/2016   AC (acromioclavicular) joint arthritis 09/13/2016   Postsurgical menopause 08/16/2016   Mucoid cyst of joint 11/04/2015   External hemorrhoid 08/20/2015   Prediabetes 06/05/2015   GERD (gastroesophageal reflux disease) 06/04/2015   Status post bilateral breast reduction 04/01/2015   Cervical disc disorder with radiculopathy of cervical region 05/23/2014   Ulnar neuropathy 01/16/2014   Fibromyalgia 01/16/2014   Recurrent nephrolithiasis 04/29/2013   Panic attacks 01/15/2013   IBS (irritable bowel syndrome)-C 12/14/2011   B12 deficiency 06/30/2009   Essential hypertension 03/02/2007    Past Medical History:  Diagnosis Date   Anemia    Anxiety    Arthritis    inflammitory   Asthma    B12 deficiency    Back pain    Chest pain    a. 09/2011 ETT: Ex time 4:13, max HR 152. No ST/T changes-->low risk; b. 06/2021 Cor CTA: Ca2+ = 0. Nl cors.   Chronic joint pain    Constipation    DDD (degenerative disc disease), lumbar    low back and neck and shoulders   Depression    during divorce & legal matters   Diastolic dysfunction    a. 09/2011 Echo: EF 60-65%, no rwma; b. 06/2021 Echo: EF 55-60%, no rwma, GrI DD, nl RV fxn.   Fatty liver    Fibromyalgia    Gallbladder problem    GERD (gastroesophageal reflux disease)    History of kidney stones    Dr rosalynn Likens   History of polycystic ovarian disease S/P BSO   Hx of anxiety disorder    Hypertension    a  Hypothyroidism    no meds   IBS (irritable bowel syndrome)    Infertility, female    Joint pain    Lactose intolerance    Lupus hx positive ANA   followed by Dr Jon Jacob; possible lupus   Multiple food allergies    Myalgia    Osteoarthritis    Polyarthritis, 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HCC)    POLYCYSTIC OVARIAN DISEASE 03/02/2007   Qualifier: Diagnosis of  By: Tish MD, Elsie POUR BSO for cysts &  endometriosis; Dr Thyra, Gyn Seeing Dr Nathanel Bunker     PONV (postoperative nausea and vomiting)    Pre-diabetes    Sweating profusely    Symptomatic mammary hypertrophy    URI (upper respiratory infection)    currently on cefdinir     Family History  Problem Relation Age of Onset   Hypertension Mother    Colon polyps Mother    Atrial fibrillation Mother        Dr Verlin   Hyperlipidemia Mother    Heart disease Mother    Anxiety disorder Mother    Sleep apnea Mother    Obesity Mother    Heart attack Father 15   Diabetes Father    Hypertension Father    Hyperlipidemia Father    Heart disease Father    Stroke Father    Obesity Father    Hypertension Sister    Colon cancer Maternal Uncle    Breast cancer Paternal Aunt 47   Colon cancer Paternal Uncle    Colon polyps Maternal Grandmother    Stroke Maternal Grandmother 76   Stroke Maternal Grandfather 37   Heart attack Paternal Grandmother 61   Diabetes Paternal Grandfather    Past Surgical History:  Procedure Laterality Date   ABDOMINAL HYSTERECTOMY     ANKLE SURGERY     X3   APPENDECTOMY  1991   exploratory lap   BACK SURGERY     BREAST REDUCTION SURGERY Bilateral 03/26/2015   Procedure: BILATERAL BREAST REDUCTION ;  Surgeon: Estefana GORMAN Fritter, DO;  Location:  [facility];  Service: Plastics;  Laterality: Bilateral;   CARPAL TUNNEL RELEASE     bilateral; Dr Alix   COLONOSCOPY     in 1990s   CYSTO/ RIGHT RETROGRADE URETERAL PYELOGRAM  07/09/2004   HX BILATERAL RENAL STONES/ RIGHT FLANK PAIN   CYSTOSCOPY W/ URETERAL STENT PLACEMENT  11/12/2011   Procedure: CYSTOSCOPY WITH RETROGRADE PYELOGRAM/URETERAL STENT PLACEMENT;  Surgeon: Ricardo Likens, MD;  Location: [facility];  Service: Urology;  Laterality: Right;   CYSTOSCOPY W/ URETERAL STENT REMOVAL  12/07/2011   Procedure: CYSTOSCOPY WITH STENT REMOVAL;  Surgeon: Ricardo Likens, MD;  Location: [facility];  Service: Urology;   Laterality: Right;   CYSTOSCOPY/RETROGRADE/URETEROSCOPY/STONE EXTRACTION WITH BASKET  12/07/2011   Procedure: CYSTOSCOPY/RETROGRADE/URETEROSCOPY/STONE EXTRACTION WITH BASKET;  Surgeon: Ricardo Likens, MD;  Location: [facility];  Service: Urology;  Laterality: Right;   DILATION AND CURETTAGE OF UTERUS     X5   EXCISION LEFT BARTHOLIN GLAND  02/05/2002   EXTRACORPOREAL SHOCK WAVE LITHOTRIPSY Right 03/11/2024   Procedure: LITHOTRIPSY, ESWL;  Surgeon: Shane Steffan BROCKS, MD;  Location: [facility];  Service: Urology;  Laterality: Right;   EYE SURGERY     Retinal tears surgery bilateral   LAPAROSCOPIC ASSISTED VAGINAL HYSTERECTOMY  2000   LAPAROSCOPIC CHOLECYSTECTOMY  05/31/2007   LAPAROSCOPIC LASER ABLATION ENDOMETRIOSIS AND LEFT SALPINGO-OOPHORECTOMY  11/03/1999   LAPAROSCOPY WITH RIGHT SALPINGO-OOPHECTOMY/ LYSIS ADHESIONS AND ABLATION ENDOMETRIOSIS  05/17/2001  LIPOSUCTION Bilateral 03/26/2015   Procedure: WITH LIPOSUCTION;  Surgeon: Estefana GORMAN Fritter, DO;  Location: [facility];  Service: Plastics;  Laterality: Bilateral;   LUMBAR LAMINECTOMY  2003   L4 - L5   NECK SURGERY  2017   PLANTAR FASCIA SURGERY     right   PLANTAR FASCIA SURGERY  02/2011   left   REDUCTION MAMMAPLASTY  2016   RIGHT URETEROSCOPIC STONE EXTRACTION  08/04/2000   SHOULDER ARTHROSCOPY WITH ROTATOR CUFF REPAIR Right 05/11/2020   Procedure: SHOULDER ARTHROSCOPY WITH ROTATOR CUFF REPAIR;  Surgeon: Leora Lynwood SAUNDERS, MD;  Location: [facility];  Service: Orthopedics;  Laterality: Right;   TONSILLECTOMY     TOTAL HIP ARTHROPLASTY Left 01/11/2022   Procedure: TOTAL HIP ARTHROPLASTY ANTERIOR APPROACH;  Surgeon: Leora Lynwood SAUNDERS, MD;  Location: [facility];  Service: Orthopedics;  Laterality: Left;   URETERAL STENT PLACEMENT  11/12/2011   Dr Alvaro   Social History[1] Social History   Social History Narrative   Patient Lives at home with her husband Sandor)   Disabled.   Education two years of  college.   Right handed.   Caffeine coffee and sweet tea. Not daily.     Immunization History  Administered Date(s) Administered   Influenza Split 02/17/2011   Influenza Whole 01/02/2010   Influenza, Mdck, Trivalent,PF 6+ MOS(egg free) 01/25/2023   Influenza, Seasonal, Injecte, Preservative Fre 01/16/2024   Influenza,inj,Quad PF,6+ Mos 01/02/2013, 01/11/2017, 01/03/2018, 02/07/2019, 01/12/2022   Influenza-Unspecified 01/01/2014, 01/08/2016, 01/11/2017, 03/04/2020, 01/18/2021   PFIZER(Purple Top)SARS-COV-2 Vaccination 07/16/2019, 08/06/2019, 03/12/2020   Pneumococcal Polysaccharide-23 06/04/2008, 05/02/2013   Tdap 08/14/2012   Zoster Recombinant(Shingrix) 08/23/2020     Objective: Vital Signs: There were no vitals taken for this visit.   Physical Exam   Musculoskeletal Exam: ***  CDAI Exam: CDAI Score: -- Patient Global: --; Provider Global: -- Swollen: --; Tender: -- Joint Exam 05/17/2024   No joint exam has been documented for this visit   There is currently no information documented on the homunculus. Go to the Rheumatology activity and complete the homunculus joint exam.  Investigation: No additional findings.  Imaging: No results found.  Recent Labs: Lab Results  Component Value Date   WBC 6.7 01/30/2024   HGB 12.9 01/30/2024   PLT 294 01/30/2024   NA 139 01/30/2024   K 4.4 01/30/2024   CL 107 (H) 01/30/2024   CO2 18 (L) 01/30/2024   GLUCOSE 77 01/30/2024   BUN 14 01/30/2024   CREATININE 0.90 01/30/2024   BILITOT 0.3 01/30/2024   ALKPHOS 120 01/30/2024   AST 22 01/30/2024   ALT 16 01/30/2024   PROT 6.8 01/30/2024   ALBUMIN 4.1 01/30/2024   CALCIUM 9.1 01/30/2024   GFRAA 125 11/11/2019    Speciality Comments: No specialty comments available.  Procedures:  No procedures performed Allergies: Chlorhexidine  gluconate, Ivp dye [iodinated contrast media], Nalbuphine, Septra [sulfamethoxazole-trimethoprim], Silicone, Sulfamethoxazole-trimethoprim,  Adhesive [tape], Cymbalta  [duloxetine  hcl], Diclofenac, Humira [adalimumab], Methotrexate and trimetrexate, Wound dressing adhesive, Apremilast, Augmentin  [amoxicillin -pot clavulanate], Betadine [povidone iodine], and Latex   Assessment / Plan:     Visit Diagnoses: No diagnosis found.  Orders: No orders of the defined types were placed in this encounter.  No orders of the defined types were placed in this encounter.   Face-to-face time spent with patient was *** minutes. Greater than 50% of time was spent in counseling and coordination of care.  Follow-Up Instructions: No follow-ups on file.   Alfonso Patterson, LPN  Note - This record  has been created using [employer].  Chart creation errors have been sought, but may not always  have been located. Such creation errors do not reflect on  the standard of medical care.     [1]  Social History Tobacco Use   Smoking status: Never    Passive exposure: Never   Smokeless tobacco: Never  Vaping Use   Vaping status: Never Used  Substance Use Topics   Alcohol use: Yes    Comment: rarely   Drug use: No   "

## 2024-05-09 NOTE — Telephone Encounter (Signed)

## 2024-05-13 ENCOUNTER — Other Ambulatory Visit

## 2024-05-16 ENCOUNTER — Ambulatory Visit (INDEPENDENT_AMBULATORY_CARE_PROVIDER_SITE_OTHER): Admitting: Family Medicine

## 2024-05-17 ENCOUNTER — Ambulatory Visit

## 2024-07-02 ENCOUNTER — Ambulatory Visit

## 2024-08-08 ENCOUNTER — Ambulatory Visit: Admitting: Cardiovascular Disease

## 2024-11-01 ENCOUNTER — Ambulatory Visit
